# Patient Record
Sex: Male | Born: 1945 | Race: White | Hispanic: No | Smoking: Never smoker
Health system: Southern US, Community
[De-identification: ages and names within clinical notes are randomized; demographics above are authoritative.]

## PROBLEM LIST (undated history)

## (undated) DIAGNOSIS — H25013 Cortical age-related cataract, bilateral: Secondary | ICD-10-CM

## (undated) DIAGNOSIS — E119 Type 2 diabetes mellitus without complications: Secondary | ICD-10-CM

## (undated) DIAGNOSIS — J309 Allergic rhinitis, unspecified: Secondary | ICD-10-CM

## (undated) DIAGNOSIS — I10 Essential (primary) hypertension: Secondary | ICD-10-CM

## (undated) DIAGNOSIS — E785 Hyperlipidemia, unspecified: Secondary | ICD-10-CM

## (undated) DIAGNOSIS — I2510 Atherosclerotic heart disease of native coronary artery without angina pectoris: Secondary | ICD-10-CM

## (undated) DIAGNOSIS — J45909 Unspecified asthma, uncomplicated: Secondary | ICD-10-CM

## (undated) DIAGNOSIS — H3530 Unspecified macular degeneration: Secondary | ICD-10-CM

## (undated) DIAGNOSIS — I4891 Unspecified atrial fibrillation: Secondary | ICD-10-CM

## (undated) HISTORY — DX: Allergic rhinitis, unspecified: J30.9

## (undated) HISTORY — PX: TONSILLECTOMY: SUR1361

## (undated) HISTORY — DX: Type 2 diabetes mellitus without complications: E11.9

## (undated) HISTORY — DX: Hyperlipidemia, unspecified: E78.5

## (undated) HISTORY — DX: Essential (primary) hypertension: I10

## (undated) HISTORY — DX: Atherosclerotic heart disease of native coronary artery without angina pectoris: I25.10

## (undated) HISTORY — DX: Unspecified atrial fibrillation: I48.91

---

## 1999-06-22 ENCOUNTER — Ambulatory Visit (HOSPITAL_COMMUNITY): Admission: RE | Admit: 1999-06-22 | Discharge: 1999-06-22 | Payer: Self-pay | Admitting: *Deleted

## 1999-06-22 ENCOUNTER — Encounter (INDEPENDENT_AMBULATORY_CARE_PROVIDER_SITE_OTHER): Payer: Self-pay | Admitting: Specialist

## 1999-07-17 ENCOUNTER — Ambulatory Visit (HOSPITAL_COMMUNITY): Admission: RE | Admit: 1999-07-17 | Discharge: 1999-07-17 | Payer: Self-pay | Admitting: *Deleted

## 2000-11-27 ENCOUNTER — Emergency Department (HOSPITAL_COMMUNITY): Admission: EM | Admit: 2000-11-27 | Discharge: 2000-11-27 | Payer: Self-pay | Admitting: Emergency Medicine

## 2000-11-27 ENCOUNTER — Encounter: Payer: Self-pay | Admitting: Emergency Medicine

## 2002-09-10 ENCOUNTER — Encounter (INDEPENDENT_AMBULATORY_CARE_PROVIDER_SITE_OTHER): Payer: Self-pay | Admitting: Specialist

## 2002-09-10 ENCOUNTER — Ambulatory Visit (HOSPITAL_COMMUNITY): Admission: RE | Admit: 2002-09-10 | Discharge: 2002-09-10 | Payer: Self-pay | Admitting: *Deleted

## 2008-02-01 ENCOUNTER — Ambulatory Visit: Admission: RE | Admit: 2008-02-01 | Discharge: 2008-02-01 | Payer: Self-pay | Admitting: Internal Medicine

## 2008-02-01 ENCOUNTER — Encounter: Payer: Self-pay | Admitting: Pulmonary Disease

## 2008-02-04 ENCOUNTER — Ambulatory Visit: Payer: Self-pay | Admitting: Pulmonary Disease

## 2008-02-04 DIAGNOSIS — I10 Essential (primary) hypertension: Secondary | ICD-10-CM | POA: Insufficient documentation

## 2008-02-04 DIAGNOSIS — Z794 Long term (current) use of insulin: Secondary | ICD-10-CM | POA: Insufficient documentation

## 2008-02-04 DIAGNOSIS — J309 Allergic rhinitis, unspecified: Secondary | ICD-10-CM | POA: Insufficient documentation

## 2008-02-04 DIAGNOSIS — E119 Type 2 diabetes mellitus without complications: Secondary | ICD-10-CM

## 2008-02-04 DIAGNOSIS — E785 Hyperlipidemia, unspecified: Secondary | ICD-10-CM | POA: Insufficient documentation

## 2008-02-04 DIAGNOSIS — J4540 Moderate persistent asthma, uncomplicated: Secondary | ICD-10-CM | POA: Insufficient documentation

## 2008-02-04 DIAGNOSIS — E11649 Type 2 diabetes mellitus with hypoglycemia without coma: Secondary | ICD-10-CM | POA: Insufficient documentation

## 2008-02-25 ENCOUNTER — Ambulatory Visit: Payer: Self-pay | Admitting: Pulmonary Disease

## 2008-08-30 ENCOUNTER — Ambulatory Visit: Payer: Self-pay | Admitting: Pulmonary Disease

## 2008-08-30 DIAGNOSIS — R059 Cough, unspecified: Secondary | ICD-10-CM | POA: Insufficient documentation

## 2008-08-30 DIAGNOSIS — R05 Cough: Secondary | ICD-10-CM

## 2009-09-06 ENCOUNTER — Ambulatory Visit: Payer: Self-pay | Admitting: Pulmonary Disease

## 2009-09-06 DIAGNOSIS — J209 Acute bronchitis, unspecified: Secondary | ICD-10-CM | POA: Insufficient documentation

## 2010-09-05 ENCOUNTER — Ambulatory Visit: Admit: 2010-09-05 | Payer: Self-pay | Admitting: Pulmonary Disease

## 2010-09-18 NOTE — Assessment & Plan Note (Signed)
Summary: consult for asthma   Referred by:  Merri Brunette PCP:  Merri Brunette  Chief Complaint:  Pulmonary Consult.  History of Present Illness:  the patient is a very pleasant 65 year old male who is being referred today for asthma.  The patient complains of increasing chest congestion over the last few months, and felt it was due to his usual allergic rhinitis that occurs in the spring and fall.  The patient then began to have chest rattling as well as worsening dyspnea on exertion.  His symptoms have worsened at night, and has now gotten to the point that it is difficult for him to sleep.  He denies chronic cough, but is having a lot of congestion with cough productive of thick white mucus primarily in the morning.  He denies any history of recurrent sinusitis, and has no history of asthma as a child.  He denies significant reflux symptoms.  The patient has had pulmonary function studies this month, and these showed severe airflow obstruction with a very significant improvement with bronchodilators.  There was minimal restriction and a normal diffusion capacity.     Current Allergies: No known allergies   Past Medical History:    Current Problems:     DM (ICD-250.00)    HYPERTENSION (ICD-401.9)    HYPERLIPIDEMIA (ICD-272.4)    ALLERGIC RHINITIS (ICD-477.9)        Family History:    heart disease: paternal grandfather (m.i.)    cancer: father (colon), sister (leukemia)  Social History:    Patient never smoked.     pt is divorced.    pt has children.    pt works as a Chief Technology Officer.   Risk Factors:  Tobacco use:  never   Review of Systems      See HPI   Vital Signs:  Patient Profile:   65 Years Old Male Height:     75 inches Weight:      319.13 pounds O2 Sat:      94 % O2 treatment:    Room Air Temp:     98.1 degrees F oral Pulse rate:   108 / minute BP sitting:   118 / 70  (left arm) Cuff size:   large  Vitals Entered By: Cyndia Diver LPN  (February 04, 2008 11:12 AM)             Is Patient Diabetic? Yes  Comments Medications reviewed with patient Cyndia Diver LPN  February 04, 2008 11:13 AM      Physical Exam  General:     obese male in nad Eyes:     PERRLA and EOMI.   Nose:     patent without discharge  Mouth:     clear Neck:     no jvd, tmg,ln Lungs:     diffuse true wheezing, mild pseudowheezing Heart:     rrr, no mrg Abdomen:     soft, nt, nd, bs+ Extremities:     trace edema, pulses intact Neurologic:     alert, oriented, moves all 4     Impression & Recommendations:  Problem # 1:  INTRINSIC ASTHMA, UNSPECIFIED (ICD-493.10) the patient's history and pulmonary function studies are most consistent with adult onset asthma.  The patient is having active bronchospasm at this time, will need aggressive treatment of airway inflammation.  His symptoms are significantly impacting his sleep, and therefore I will treat him with a very short course of prednisone  to help with symptom relief.  I've  asked the patient to watch his blood sugars closely during this time.  I will also start the patient on a combined long-acting bronchodilator, as well as inhaled corticosteroid.  I would hope that we can get him to the inhaled steroid alone with p.r.n. beta-2 agonist.  I have had a long discussion with the patient about asthma, including the role of airway inflammation.  I have explained to the patient he will need chronic medication lifetime, and that he should not take his medications as needed.  Medications Added to Medication List This Visit: 1)  Glyburide-metformin 5-500 Mg Tabs (Glyburide-metformin) .... Take 3 tabs by mouth daily 2)  Amlodipine Besylate 10 Mg Tabs (Amlodipine besylate) .... Take 1/2 tab by mouth once daily 3)  Quinapril Hcl 40 Mg Tabs (Quinapril hcl) .... Take 1 tablet by mouth once a day 4)  Diovan Hct 320-25 Mg Tabs (Valsartan-hydrochlorothiazide) .... Take 1 tablet by mouth once a day 5)  Lipitor  40 Mg Tabs (Atorvastatin calcium) .... Take 1 tablet by mouth once a day 6)  Aciphex 20 Mg Tbec (Rabeprazole sodium) .... Take 1 tablet by mouth once a day 7)  Aspirin Low Dose 81 Mg Tabs (Aspirin) .... Take 1 tablet by mouth once a day 8)  Glucosamine Chondroitin  .... Take 3 tabs by mouth daily 9)  Zyrtec Allergy 10 Mg Tabs (Cetirizine hcl) .... Take 1 tablet by mouth once a day as needed 10)  Ibuprofen 200 Mg Caps (Ibuprofen) .... Take by mouth as needed 11)  Proventil Hfa 108 (90 Base) Mcg/act Aers (Albuterol sulfate) .... Inhale 2 puffs every 4 to 6 hours as needed 12)  Prednisone 20 Mg Tabs (Prednisone) .... Take 2 tabs by mouth daily x 2 days, then 1 1/2 tabs by mouth daily for 2 days, then 1 tab by mouth daily for 2 days, then 1/2 tab by mouth daily for 2 days then d/c   Patient Instructions: 1)  advair 250/50 one in am and pm...rinse mouth WELL 2)  use proventil 2 puffs every 4 hrs if needed for rescue 3)  will give prednisone taper to get you over this acute episode. Watch blood sugars during this time. 4)  f/u in 3 weeks or sooner if problems.   Prescriptions: PREDNISONE 20 MG  TABS (PREDNISONE) take 2 tabs by mouth daily x 2 days, then 1 1/2 tabs by mouth daily for 2 days, then 1 tab by mouth daily for 2 days, then 1/2 tab by mouth daily for 2 days then d/c  #qs x 0   Entered by:   Cyndia Diver LPN   Authorized by:   Barbaraann Share MD   Signed by:   Cyndia Diver LPN on 06/15/2535   Method used:   Telephoned to ...       [facility] #[id number]       [address]       [place], [place]  [address]       Ph: [phone]       Fax: [phone]   RxID:   [id number]  ]

## 2010-09-18 NOTE — Assessment & Plan Note (Signed)
Summary: rov for asthma, acute bronchitis   Primary Provider/Referring Provider:  Merri Brunette  CC:  Pt is here for a 1 yr f/u appt.  Pt states breathing has been "fine."  Pt states he hasn't needed to use rescue hfa.  Pt does c/o "cold symptoms."  Pt c/o coughing up yellow to green colored sputum with small "spots of dark red blood."  Pt also c/o headache with "pain behind R eye."  .  History of Present Illness: the pt comes in today for f/u of his known asthma.  He has been doing well on his current inhaler regimen, and has had no acute exacerbations or increase in rescue inhaler use.  He states that his exertional tolerance is at his usual baseline.  He does c/o of a 2 week h/o head congestion, postnasal drip, and now chest congestion with cough of discolored mucus.  It is purulent with a few flecks of blood.  He denies nasal and head congestion now, but feels it has moved into his chest.  It has not affected his breathing.  Current Medications (verified): 1)  Amlodipine Besylate 10 Mg  Tabs (Amlodipine Besylate) .... Take 1/2 Tab By Mouth Once Daily 2)  Diovan Hct 320-25 Mg  Tabs (Valsartan-Hydrochlorothiazide) .... Take 1 Tablet By Mouth Once A Day 3)  Lipitor 40 Mg  Tabs (Atorvastatin Calcium) .... Take 1 Tablet By Mouth Once A Day 4)  Aspirin Low Dose 81 Mg  Tabs (Aspirin) .... Take 1 Tablet By Mouth Once A Day 5)  Glucosamine Chondroitin .... Take 3 Tabs By Mouth Daily 6)  Zyrtec Allergy 10 Mg  Tabs (Cetirizine Hcl) .... Take 1 Tablet By Mouth Once A Day As Needed 7)  Ibuprofen 200 Mg  Caps (Ibuprofen) .... Take By Mouth As Needed 8)  Proventil Hfa 108 (90 Base) Mcg/act  Aers (Albuterol Sulfate) .... Inhale 2 Puffs Every 4 To 6 Hours As Needed 9)  Advair Diskus 250-50 Mcg/dose  Misc (Fluticasone-Salmeterol) .... Inhale 1 Puff Two Times A Day 10)  Prilosec 40 Mg Cpdr (Omeprazole) .... Take 1 Tablet By Mouth Once A Day 11)  Actos 30 Mg Tabs (Pioglitazone Hcl) .... Take 1 Tablet By Mouth  Once A Day 12)  Humalog Kwikpen 100 Unit/ml Soln (Insulin Lispro (Human)) .... 75 Units Two Times A Day 13)  Glucosamine-Chondroitin  Caps (Glucosamine-Chondroit-Vit C-Mn) .... Take 3 Tabs By Mouth Daily  Allergies (verified): No Known Drug Allergies  Review of Systems       The patient complains of productive cough, coughing up blood, headaches, sneezing, and change in color of mucus.  The patient denies shortness of breath with activity, shortness of breath at rest, non-productive cough, chest pain, irregular heartbeats, acid heartburn, indigestion, loss of appetite, weight change, abdominal pain, difficulty swallowing, sore throat, tooth/dental problems, nasal congestion/difficulty breathing through nose, itching, ear ache, anxiety, depression, hand/feet swelling, joint stiffness or pain, rash, and fever.    Vital Signs:  Patient profile:   65 year old male Height:      75 inches Weight:      337.13 pounds BMI:     42.29 O2 Sat:      94 % on Room air Temp:     98.5 degrees F oral Pulse rate:   98 / minute BP sitting:   136 / 78  (left arm) Cuff size:   large  Vitals Entered By: Arman Filter LPN (September 06, 2009 3:25 PM)  O2 Flow:  Room air CC: Pt  is here for a 1 yr f/u appt.  Pt states breathing has been "fine."  Pt states he hasn't needed to use rescue hfa.  Pt does c/o "cold symptoms."  Pt c/o coughing up yellow to green colored sputum with small "spots of dark red blood."  Pt also c/o headache with "pain behind R eye."   Comments Medications reviewed with patient Arman Filter LPN  September 06, 2009 3:25 PM    Physical Exam  General:  ow male in nad Nose:  no purulence noted Lungs:  totally clear to auscultation no wheezing or rhonchi Heart:  rrr, 2/6 sem Neurologic:  alert and oriented, moves all 4.   Impression & Recommendations:  Problem # 1:  INTRINSIC ASTHMA, UNSPECIFIED (ICD-493.10) the pt's asthma has been doing very well on his current regimen.  I have  asked him to continue, and also to work on weight loss and conditioning.  Problem # 2:  BRONCHITIS, ACUTE (ICD-466.0) The pt has had persistent symptoms for at least 2 weeks, outside the usual window for viral infections.  Will treat with abx for possible bacterial acute bronchitis.  Medications Added to Medication List This Visit: 1)  Prilosec 40 Mg Cpdr (Omeprazole) .... Take 1 tablet by mouth once a day 2)  Actos 30 Mg Tabs (Pioglitazone hcl) .... Take 1 tablet by mouth once a day 3)  Humalog Kwikpen 100 Unit/ml Soln (Insulin lispro (human)) .... 75 units two times a day 4)  Glucosamine-chondroitin Caps (Glucosamine-chondroit-vit c-mn) .... Take 3 tabs by mouth daily 5)  Zithromax Z-pak 250 Mg Tabs (Azithromycin) .... 2 by mouth today, then one each day until gone.  Other Orders: Est. Patient Level III (25956)  Patient Instructions: 1)  will treat with zpak to see if symptoms improve. 2)  no change in asthma meds. 3)  followup with me in 12mos or sooner if problems.  Prescriptions: ZITHROMAX Z-PAK 250 MG TABS (AZITHROMYCIN) 2 by mouth today, then one each day until gone.  #6 x 0   Entered and Authorized by:   Barbaraann Share MD   Signed by:   Barbaraann Share MD on 09/06/2009   Method used:   Print then Give to Patient   RxID:   [id number]    Immunization History:  Influenza Immunization History:    Influenza:  historical (08/19/2008)

## 2010-09-18 NOTE — Assessment & Plan Note (Signed)
Summary: rov for asthma   Referred by:  Merri Brunette PCP:  Merri Brunette  Chief Complaint:  Pt is here for a 3 week follow up.  Pt states breathing is "much better" since starting Advair.  Marland Kitchen  History of Present Illness: the patient comes in today for followup of his asthma. He has been on Advair since last visit, and has seen a significant improvement in his breathing. He is no longer wheezing or coughing, and his exercise tolerance has increased significantly. He has not used his rescue inhaler since the last visit. Overall, he is pleased with his breathing, and has had no side effects from the medications.     Current Allergies: No known allergies       Vital Signs:  Patient Profile:   65 Years Old Male Height:     75 inches Weight:      324.50 pounds O2 Sat:      91 % O2 treatment:    Room Air Temp:     98.3 degrees F oral Pulse rate:   102 / minute BP sitting:   130 / 68  (left arm) Cuff size:   regular  Vitals Entered By: Cyndia Diver LPN (February 24, 1609 9:24 AM)             Comments Medications reviewed with patient Cyndia Diver LPN  February 25, 9603 9:24 AM      Physical Exam  General:     obese male in nad Lungs:     totally clear to auscultation. Heart:     rrr, no mrg.      Impression & Recommendations:  Problem # 1:  INTRINSIC ASTHMA, UNSPECIFIED (ICD-493.10) The pt is doing very well on advair.  No further symptoms of sob or wheezing. I have gone over with him again the inflammatory cascade of asthma, and how important it is to stay on medicine everyday.  I have also reviewed the "red flags" for poorly controlled asthma.  Will check spirometry next visit  Medications Added to Medication List This Visit: 1)  Advair Diskus 250-50 Mcg/dose Misc (Fluticasone-salmeterol) .... Inhale 1 puff two times a day   Patient Instructions: 1)  Please schedule a follow-up appointment in 6 months. 2)  no change in medications 3)  Will check spirometry at the  next visit   Prescriptions: ADVAIR DISKUS 250-50 MCG/DOSE  MISC (FLUTICASONE-SALMETEROL) Inhale 1 puff two times a day  #1 x 6   Entered and Authorized by:   Barbaraann Share MD   Signed by:   Barbaraann Share MD on 02/25/2008   Method used:   Electronically sent to ...       [facility] #[id number]*       [address]       [place], [place]  [address]       Ph: [phone]       Fax: [phone]   RxID:   [id number]  ]

## 2010-09-18 NOTE — Assessment & Plan Note (Signed)
Summary: rov for asthma   Referred by:  Merri Brunette PCP:  Merri Brunette  Chief Complaint:  Asthma.  Pt states his breathing has improved on Advair. Still has a little congestion.Marland Kitchen  History of Present Illness: the pt comes in today for f/u of his asthma.  He has been on advair since the last visit, and has seen a considerable improvement in his breathing since being on the medication.  He still c/o some dyspnea with heavy exertional activities, but this is most likely due to his morbid obesity and deconditioning.  His only other complaint is that of upper airway "congestion" that leads to excessive throat clearing.  He does have a history of reflux, but does not feel he has significant symptoms.  He is having a lot of postnasal drip.  It should also be noted that he does take quinapril as well, and advair has been noted to have an associated upper airway irritation with some frequency.     Prior Medications Reviewed Using: Patient Recall  Updated Prior Medication List: GLYBURIDE-METFORMIN 5-500 MG  TABS (GLYBURIDE-METFORMIN) take 3 tabs by mouth daily AMLODIPINE BESYLATE 10 MG  TABS (AMLODIPINE BESYLATE) take 1/2 tab by mouth once daily QUINAPRIL HCL 40 MG  TABS (QUINAPRIL HCL) Take 1 tablet by mouth once a day DIOVAN HCT 320-25 MG  TABS (VALSARTAN-HYDROCHLOROTHIAZIDE) Take 1 tablet by mouth once a day LIPITOR 40 MG  TABS (ATORVASTATIN CALCIUM) Take 1 tablet by mouth once a day ASPIRIN LOW DOSE 81 MG  TABS (ASPIRIN) Take 1 tablet by mouth once a day * GLUCOSAMINE CHONDROITIN take 3 tabs by mouth daily ZYRTEC ALLERGY 10 MG  TABS (CETIRIZINE HCL) Take 1 tablet by mouth once a day as needed IBUPROFEN 200 MG  CAPS (IBUPROFEN) take by mouth as needed PROVENTIL HFA 108 (90 BASE) MCG/ACT  AERS (ALBUTEROL SULFATE) inhale 2 puffs every 4 to 6 hours as needed ADVAIR DISKUS 250-50 MCG/DOSE  MISC (FLUTICASONE-SALMETEROL) Inhale 1 puff two times a day  Current Allergies (reviewed today): No known  allergies      Review of Systems      See HPI   Vital Signs:  Patient Profile:   65 Years Old Male Height:     75 inches Weight:      319.6 pounds O2 Sat:      95 % O2 treatment:    Room Air Temp:     97.8 degrees F oral Pulse rate:   94 / minute BP sitting:   114 / 78  (left arm) Cuff size:   large  Vitals Entered By: Michel Bickers CMA (August 30, 2008 9:27 AM)                 Physical Exam  General:     obese male in nad Lungs:     totally clear to auscultation Heart:     rrr, no mrg    Pulmonary Function Test Date: 08/30/2008 Height (in.): 75 Gender: Male  Pre-Spirometry FVC    Value: 4.49 L/min   Pred: 5.52 L/min     % Pred: 81 % FEV1    Value: 3.08 L     Pred: 4.41 L     % Pred: 69 % FEV1/FVC  Value: 69 %     Comments: minimal airflow obstruction    Problem # 1:  INTRINSIC ASTHMA, UNSPECIFIED (ICD-493.10) the pt is doing well from an asthma standpoint.  He has seen significant improvement in his breathing on advair.  His  FEV1 is dramatically improved since last visit.  No change in meds currently.  Problem # 2:  COUGH (ICD-786.2) his history is most c/w an upper airway issue.  He has multiple reasons for having this, including postnasal drip, LPR, ACE inhibitor, and the dry powder from advair.  At this point, will give him a trial of otc antihistamine and see how he does.  We can always change advair to symbicort, try a ppi, or get off ace if symptoms continue.  The pt will let us know if this doesn't help   Patient Instructions: 1)  stay on advair 2)  trial of chlorpheniramine 8mg  at bedtime for postnasal drip 3)  f/u with me in one year or sooner if problems

## 2010-10-19 ENCOUNTER — Ambulatory Visit: Payer: Self-pay | Admitting: Pulmonary Disease

## 2010-11-24 ENCOUNTER — Other Ambulatory Visit: Payer: Self-pay | Admitting: Pulmonary Disease

## 2010-11-27 ENCOUNTER — Other Ambulatory Visit: Payer: Self-pay | Admitting: Pulmonary Disease

## 2010-11-27 MED ORDER — FLUTICASONE-SALMETEROL 250-50 MCG/DOSE IN AEPB: 1.0000 | INHALATION_SPRAY | Freq: Two times a day (BID) | RESPIRATORY_TRACT | Status: DC

## 2010-11-30 ENCOUNTER — Ambulatory Visit: Payer: Self-pay | Admitting: Pulmonary Disease

## 2010-12-19 ENCOUNTER — Encounter: Payer: Self-pay | Admitting: Pulmonary Disease

## 2010-12-21 ENCOUNTER — Ambulatory Visit: Payer: Self-pay | Admitting: Pulmonary Disease

## 2011-01-01 ENCOUNTER — Encounter: Payer: Self-pay | Admitting: Pulmonary Disease

## 2011-01-04 ENCOUNTER — Ambulatory Visit (INDEPENDENT_AMBULATORY_CARE_PROVIDER_SITE_OTHER): Payer: Federal, State, Local not specified - PPO | Admitting: Pulmonary Disease

## 2011-01-04 ENCOUNTER — Encounter: Payer: Self-pay | Admitting: Pulmonary Disease

## 2011-01-04 VITALS — BP 108/62 | HR 82 | Temp 98.2°F | Ht 75.0 in | Wt 354.8 lb

## 2011-01-04 DIAGNOSIS — J45909 Unspecified asthma, uncomplicated: Secondary | ICD-10-CM

## 2011-01-04 NOTE — Assessment & Plan Note (Signed)
The pt is doing well on his current regimen, and rarely uses his rescue inhaler.  He feels his breathing is doing well, and has not had an acute exac since his last visit.  I have asked him to continue on current regimen, and to f/u with me in one year.

## 2011-01-04 NOTE — Progress Notes (Signed)
  Subjective:    Patient ID: Brandon Sanchez, male    DOB: 14-Feb-1946, 65 y.o.   MRN: [id number]  HPI The pt comes in today for f/u of his known asthma.  He is sticking to his current regimen, and reports no flareup or rescue inhaler use.  He feels his breathing is doing well, and denies cough or congestion.  His allergies have really not been an issue for him this year.    Review of Systems  Constitutional: Negative for fever and unexpected weight change.  HENT: Negative for ear pain, nosebleeds, congestion, sore throat, rhinorrhea, sneezing, trouble swallowing, dental problem, postnasal drip and sinus pressure.   Eyes: Negative for redness and itching.  Respiratory: Negative for cough, chest tightness, shortness of breath and wheezing.   Cardiovascular: Negative for palpitations and leg swelling.  Gastrointestinal: Negative for nausea and vomiting.  Genitourinary: Negative for dysuria.  Musculoskeletal: Negative for joint swelling.  Skin: Negative for rash.  Neurological: Negative for headaches.  Hematological: Does not bruise/bleed easily.  Psychiatric/Behavioral: Negative for dysphoric mood. The patient is not nervous/anxious.        Objective:   Physical Exam Obese male in nad Nares without discharge or obvious purulence Chest clear to auscultation Cor with rrr LE with 2+ edema, no cyanosis noted Alert, oriented, moves all 4        Assessment & Plan:

## 2011-01-04 NOTE — Patient Instructions (Signed)
Stay on current asthma meds.  followup with me in one year.

## 2011-01-04 NOTE — Op Note (Signed)
   NAME:  Brandon Sanchez, Brandon Sanchez NO.:  [id number]   MEDICAL RECORD NO.:  [id number]                   PATIENT TYPE:  AMB   LOCATION:  ENDO                                 FACILITY:  [facility]   PHYSICIAN:  Georgiana Spinner, M.D.                 DATE OF BIRTH:  11/29/1945   DATE OF PROCEDURE:  09/10/2002  DATE OF DISCHARGE:                                 OPERATIVE REPORT   PROCEDURE:  Colonoscopy.   INDICATIONS:  Colon polyps.   ANESTHESIA:  None given at patient's request.  He had no ride home.   DESCRIPTION OF PROCEDURE:  With patient in the left lateral decubitus  position, rectal examination was performed.  Subsequently, the Olympus  videoscopic colonoscope was inserted in the rectum and passed under direct  vision to the cecum, identified by the ileocecal valve and appendiceal  orifice, both of which were photographed.  The cecum appeared to be somewhat  edematous and possibly inflamed with a localized colitis.  Therefore,  biopsies were taken.  We entered into the terminal ileum which appeared  maybe mildly edematous.  It, too, was photographed, and it too, was  biopsied.  From this point, the colonoscope was then slowly withdrawn,  taking circumferential views of the entire colonic mucosa, stopping in the  rectum which appears normal on direct and retroflexed view.  The endoscope  was straightened and withdrawn.  The patient's vital signs and pulse  oximeter remained stable.  The patient tolerated the procedure well without  apparent complications.   FINDINGS:  Changes of possible colitis in cecum, localized with possibly  some edema of terminal ileum as well.  At any rate, we will await biopsy  report.  The patient will call me for results and follow up with me as an  outpatient.                                               Georgiana Spinner, M.D.    GMO/MEDQ  D:  09/10/2002  T:  09/10/2002  Job:  [id number]

## 2011-01-19 ENCOUNTER — Other Ambulatory Visit: Payer: Self-pay | Admitting: Pulmonary Disease

## 2011-11-07 ENCOUNTER — Other Ambulatory Visit: Payer: Self-pay | Admitting: Nephrology

## 2011-11-07 DIAGNOSIS — N183 Chronic kidney disease, stage 3 unspecified: Secondary | ICD-10-CM

## 2011-12-13 ENCOUNTER — Ambulatory Visit
Admission: RE | Admit: 2011-12-13 | Discharge: 2011-12-13 | Disposition: A | Discharge status: Home / Self Care | Payer: Federal, State, Local not specified - PPO | Source: Ambulatory Visit | Attending: Nephrology | Admitting: Nephrology

## 2011-12-13 DIAGNOSIS — N183 Chronic kidney disease, stage 3 unspecified: Secondary | ICD-10-CM

## 2011-12-25 LAB — PULMONARY FUNCTION TEST

## 2012-01-10 ENCOUNTER — Ambulatory Visit: Payer: Federal, State, Local not specified - PPO | Admitting: Pulmonary Disease

## 2012-01-30 ENCOUNTER — Other Ambulatory Visit: Payer: Self-pay | Admitting: Pulmonary Disease

## 2012-02-07 ENCOUNTER — Encounter: Payer: Self-pay | Admitting: Pulmonary Disease

## 2012-02-07 ENCOUNTER — Ambulatory Visit (INDEPENDENT_AMBULATORY_CARE_PROVIDER_SITE_OTHER): Payer: Federal, State, Local not specified - PPO | Admitting: Pulmonary Disease

## 2012-02-07 VITALS — BP 106/62 | HR 81 | Temp 98.2°F | Ht 75.0 in | Wt 358.6 lb

## 2012-02-07 DIAGNOSIS — J45909 Unspecified asthma, uncomplicated: Secondary | ICD-10-CM

## 2012-02-07 MED ORDER — ALBUTEROL SULFATE HFA 108 (90 BASE) MCG/ACT IN AERS: 2.0000 | INHALATION_SPRAY | Freq: Four times a day (QID) | RESPIRATORY_TRACT | Status: DC | PRN

## 2012-02-07 NOTE — Assessment & Plan Note (Signed)
The patient has been doing well from an asthma standpoint, and in fact his recent spirometry shows that his flows have significantly improved.  He is having no issues with acute exacerbations or rescue inhaler use, and I have asked him to focus on weight loss and a conditioning program.  His only complaint today is that of hoarseness and throat clearing associated with Advair at times, and I have offered to change him to a different medication.  He wishes to stay on Advair since he is doing so well, and will call me if his symptoms worsen.

## 2012-02-07 NOTE — Patient Instructions (Addendum)
Stay on advair.  However, call me if you feel this is continuing to aggrevate your throat.   Work on weight loss and conditioning.  followup with me in one year.

## 2012-02-07 NOTE — Addendum Note (Signed)
Addended by: Nita Sells on: 02/07/2012 02:09 PM   Modules accepted: Orders

## 2012-02-07 NOTE — Progress Notes (Signed)
  Subjective:    Patient ID: Brandon Sanchez, male    DOB: 29-Jul-1946, 66 y.o.   MRN: [id number]  HPI Patient comes in today for followup of his known asthma.  He's been compliant on his maintenance inhaler, and has not required his rescue inhaler.  He denies having an acute exacerbation since his last visit.  He has had a recent exercise test with spirometry, and his airflows are actually improved significantly from 2009.   Review of Systems  Constitutional: Negative for fever and unexpected weight change.  HENT: Negative for ear pain, nosebleeds, congestion, sore throat, rhinorrhea, sneezing, trouble swallowing, dental problem, postnasal drip and sinus pressure.        Throat clearing  Eyes: Negative for redness and itching.  Respiratory: Negative for cough, chest tightness, shortness of breath and wheezing.   Cardiovascular: Positive for leg swelling. Negative for palpitations.  Gastrointestinal: Negative for nausea and vomiting.  Genitourinary: Negative for dysuria.  Musculoskeletal: Negative for joint swelling.  Skin: Negative for rash.  Neurological: Negative for headaches.  Hematological: Does not bruise/bleed easily.  Psychiatric/Behavioral: Negative for dysphoric mood. The patient is not nervous/anxious.   All other systems reviewed and are negative.       Objective:   Physical Exam Morbidly obese male in no acute distress Nose with purulent discharge noted Chest totally clear to auscultation, no wheezing or rhonchi Cardiac exam with regular rate and rhythm, 2/6 murmur Lower extremities with 2+ edema, no cyanosis Alert and oriented times all 4 extremities.       Assessment & Plan:

## 2012-04-16 DIAGNOSIS — I10 Essential (primary) hypertension: Secondary | ICD-10-CM | POA: Diagnosis not present

## 2012-04-16 DIAGNOSIS — N182 Chronic kidney disease, stage 2 (mild): Secondary | ICD-10-CM | POA: Diagnosis not present

## 2012-06-09 DIAGNOSIS — I10 Essential (primary) hypertension: Secondary | ICD-10-CM | POA: Diagnosis not present

## 2012-06-09 DIAGNOSIS — IMO0001 Reserved for inherently not codable concepts without codable children: Secondary | ICD-10-CM | POA: Diagnosis not present

## 2012-06-09 DIAGNOSIS — E7800 Pure hypercholesterolemia, unspecified: Secondary | ICD-10-CM | POA: Diagnosis not present

## 2012-06-12 DIAGNOSIS — M76899 Other specified enthesopathies of unspecified lower limb, excluding foot: Secondary | ICD-10-CM | POA: Diagnosis not present

## 2012-06-12 DIAGNOSIS — Z23 Encounter for immunization: Secondary | ICD-10-CM | POA: Diagnosis not present

## 2012-06-12 DIAGNOSIS — I10 Essential (primary) hypertension: Secondary | ICD-10-CM | POA: Diagnosis not present

## 2012-06-12 DIAGNOSIS — J45909 Unspecified asthma, uncomplicated: Secondary | ICD-10-CM | POA: Diagnosis not present

## 2012-06-12 DIAGNOSIS — E119 Type 2 diabetes mellitus without complications: Secondary | ICD-10-CM | POA: Diagnosis not present

## 2012-06-15 DIAGNOSIS — N182 Chronic kidney disease, stage 2 (mild): Secondary | ICD-10-CM | POA: Diagnosis not present

## 2012-06-15 DIAGNOSIS — IMO0001 Reserved for inherently not codable concepts without codable children: Secondary | ICD-10-CM | POA: Diagnosis not present

## 2012-06-15 DIAGNOSIS — E7800 Pure hypercholesterolemia, unspecified: Secondary | ICD-10-CM | POA: Diagnosis not present

## 2012-06-15 DIAGNOSIS — I10 Essential (primary) hypertension: Secondary | ICD-10-CM | POA: Diagnosis not present

## 2012-10-13 DIAGNOSIS — I10 Essential (primary) hypertension: Secondary | ICD-10-CM | POA: Diagnosis not present

## 2012-10-13 DIAGNOSIS — IMO0001 Reserved for inherently not codable concepts without codable children: Secondary | ICD-10-CM | POA: Diagnosis not present

## 2012-10-13 DIAGNOSIS — E119 Type 2 diabetes mellitus without complications: Secondary | ICD-10-CM | POA: Diagnosis not present

## 2012-10-21 ENCOUNTER — Other Ambulatory Visit: Payer: Self-pay | Admitting: Pulmonary Disease

## 2012-10-29 DIAGNOSIS — M25569 Pain in unspecified knee: Secondary | ICD-10-CM | POA: Diagnosis not present

## 2012-11-03 DIAGNOSIS — Z125 Encounter for screening for malignant neoplasm of prostate: Secondary | ICD-10-CM | POA: Diagnosis not present

## 2012-11-03 DIAGNOSIS — E7800 Pure hypercholesterolemia, unspecified: Secondary | ICD-10-CM | POA: Diagnosis not present

## 2012-11-03 DIAGNOSIS — Z0000 Encounter for general adult medical examination without abnormal findings: Secondary | ICD-10-CM | POA: Diagnosis not present

## 2012-11-03 DIAGNOSIS — I10 Essential (primary) hypertension: Secondary | ICD-10-CM | POA: Diagnosis not present

## 2012-11-03 DIAGNOSIS — IMO0001 Reserved for inherently not codable concepts without codable children: Secondary | ICD-10-CM | POA: Diagnosis not present

## 2012-11-09 DIAGNOSIS — E7800 Pure hypercholesterolemia, unspecified: Secondary | ICD-10-CM | POA: Diagnosis not present

## 2012-11-09 DIAGNOSIS — I10 Essential (primary) hypertension: Secondary | ICD-10-CM | POA: Diagnosis not present

## 2012-11-09 DIAGNOSIS — L570 Actinic keratosis: Secondary | ICD-10-CM | POA: Diagnosis not present

## 2012-11-09 DIAGNOSIS — N529 Male erectile dysfunction, unspecified: Secondary | ICD-10-CM | POA: Diagnosis not present

## 2012-11-09 DIAGNOSIS — R609 Edema, unspecified: Secondary | ICD-10-CM | POA: Diagnosis not present

## 2012-11-09 DIAGNOSIS — IMO0001 Reserved for inherently not codable concepts without codable children: Secondary | ICD-10-CM | POA: Diagnosis not present

## 2012-11-16 DIAGNOSIS — R609 Edema, unspecified: Secondary | ICD-10-CM | POA: Diagnosis not present

## 2012-11-16 DIAGNOSIS — E291 Testicular hypofunction: Secondary | ICD-10-CM | POA: Diagnosis not present

## 2012-11-19 DIAGNOSIS — E291 Testicular hypofunction: Secondary | ICD-10-CM | POA: Diagnosis not present

## 2012-11-19 DIAGNOSIS — I10 Essential (primary) hypertension: Secondary | ICD-10-CM | POA: Diagnosis not present

## 2012-11-19 DIAGNOSIS — N182 Chronic kidney disease, stage 2 (mild): Secondary | ICD-10-CM | POA: Diagnosis not present

## 2012-11-19 DIAGNOSIS — R609 Edema, unspecified: Secondary | ICD-10-CM | POA: Diagnosis not present

## 2012-11-30 DIAGNOSIS — Z79899 Other long term (current) drug therapy: Secondary | ICD-10-CM | POA: Diagnosis not present

## 2012-11-30 DIAGNOSIS — I10 Essential (primary) hypertension: Secondary | ICD-10-CM | POA: Diagnosis not present

## 2012-12-10 DIAGNOSIS — N289 Disorder of kidney and ureter, unspecified: Secondary | ICD-10-CM | POA: Diagnosis not present

## 2012-12-10 DIAGNOSIS — I10 Essential (primary) hypertension: Secondary | ICD-10-CM | POA: Diagnosis not present

## 2012-12-10 DIAGNOSIS — R609 Edema, unspecified: Secondary | ICD-10-CM | POA: Diagnosis not present

## 2012-12-10 DIAGNOSIS — Z79899 Other long term (current) drug therapy: Secondary | ICD-10-CM | POA: Diagnosis not present

## 2012-12-17 DIAGNOSIS — Z79899 Other long term (current) drug therapy: Secondary | ICD-10-CM | POA: Diagnosis not present

## 2012-12-18 DIAGNOSIS — I10 Essential (primary) hypertension: Secondary | ICD-10-CM | POA: Diagnosis not present

## 2012-12-18 DIAGNOSIS — IMO0001 Reserved for inherently not codable concepts without codable children: Secondary | ICD-10-CM | POA: Diagnosis not present

## 2012-12-22 DIAGNOSIS — N183 Chronic kidney disease, stage 3 unspecified: Secondary | ICD-10-CM | POA: Diagnosis not present

## 2012-12-22 DIAGNOSIS — R609 Edema, unspecified: Secondary | ICD-10-CM | POA: Diagnosis not present

## 2012-12-22 DIAGNOSIS — N179 Acute kidney failure, unspecified: Secondary | ICD-10-CM | POA: Diagnosis not present

## 2012-12-23 DIAGNOSIS — Z79899 Other long term (current) drug therapy: Secondary | ICD-10-CM | POA: Diagnosis not present

## 2012-12-24 DIAGNOSIS — R609 Edema, unspecified: Secondary | ICD-10-CM | POA: Diagnosis not present

## 2012-12-24 DIAGNOSIS — E291 Testicular hypofunction: Secondary | ICD-10-CM | POA: Diagnosis not present

## 2012-12-24 DIAGNOSIS — N289 Disorder of kidney and ureter, unspecified: Secondary | ICD-10-CM | POA: Diagnosis not present

## 2012-12-24 DIAGNOSIS — Z79899 Other long term (current) drug therapy: Secondary | ICD-10-CM | POA: Diagnosis not present

## 2012-12-25 DIAGNOSIS — E119 Type 2 diabetes mellitus without complications: Secondary | ICD-10-CM | POA: Diagnosis not present

## 2012-12-31 DIAGNOSIS — E291 Testicular hypofunction: Secondary | ICD-10-CM | POA: Diagnosis not present

## 2012-12-31 DIAGNOSIS — IMO0001 Reserved for inherently not codable concepts without codable children: Secondary | ICD-10-CM | POA: Diagnosis not present

## 2013-01-06 DIAGNOSIS — Z79899 Other long term (current) drug therapy: Secondary | ICD-10-CM | POA: Diagnosis not present

## 2013-01-06 DIAGNOSIS — E119 Type 2 diabetes mellitus without complications: Secondary | ICD-10-CM | POA: Diagnosis not present

## 2013-01-07 DIAGNOSIS — E291 Testicular hypofunction: Secondary | ICD-10-CM | POA: Diagnosis not present

## 2013-01-07 DIAGNOSIS — N289 Disorder of kidney and ureter, unspecified: Secondary | ICD-10-CM | POA: Diagnosis not present

## 2013-01-07 DIAGNOSIS — R609 Edema, unspecified: Secondary | ICD-10-CM | POA: Diagnosis not present

## 2013-01-12 DIAGNOSIS — N289 Disorder of kidney and ureter, unspecified: Secondary | ICD-10-CM | POA: Diagnosis not present

## 2013-01-19 DIAGNOSIS — N289 Disorder of kidney and ureter, unspecified: Secondary | ICD-10-CM | POA: Diagnosis not present

## 2013-01-20 DIAGNOSIS — Z006 Encounter for examination for normal comparison and control in clinical research program: Secondary | ICD-10-CM | POA: Diagnosis not present

## 2013-01-20 DIAGNOSIS — I10 Essential (primary) hypertension: Secondary | ICD-10-CM | POA: Diagnosis not present

## 2013-01-20 DIAGNOSIS — E7800 Pure hypercholesterolemia, unspecified: Secondary | ICD-10-CM | POA: Diagnosis not present

## 2013-01-20 DIAGNOSIS — N182 Chronic kidney disease, stage 2 (mild): Secondary | ICD-10-CM | POA: Diagnosis not present

## 2013-01-20 DIAGNOSIS — IMO0001 Reserved for inherently not codable concepts without codable children: Secondary | ICD-10-CM | POA: Diagnosis not present

## 2013-02-12 ENCOUNTER — Ambulatory Visit (INDEPENDENT_AMBULATORY_CARE_PROVIDER_SITE_OTHER): Payer: Medicare Other | Admitting: Pulmonary Disease

## 2013-02-12 ENCOUNTER — Encounter: Payer: Self-pay | Admitting: Pulmonary Disease

## 2013-02-12 VITALS — BP 118/70 | HR 75 | Temp 98.1°F | Ht 74.0 in | Wt 352.8 lb

## 2013-02-12 DIAGNOSIS — J45909 Unspecified asthma, uncomplicated: Secondary | ICD-10-CM

## 2013-02-12 NOTE — Patient Instructions (Addendum)
Stay on your current asthma meds. Stay as active as possible. followup with me in one year if doing well.

## 2013-02-12 NOTE — Progress Notes (Signed)
  Subjective:    Patient ID: Brandon Sanchez, male    DOB: Aug 05, 1946, 67 y.o.   MRN: [id number]  HPI The patient comes in today for followup of his known asthma.  He is staying compliant on his current regimen, and is doing very well.  He never requires his rescue inhaler, and has not had an acute exacerbation since his last visit.  He is very satisfied with his asthma control, and has been exercising regularly.   Review of Systems  Constitutional: Negative for fever and unexpected weight change.  HENT: Negative for ear pain, nosebleeds, congestion, sore throat, rhinorrhea, sneezing, trouble swallowing, dental problem, postnasal drip and sinus pressure.        Allergies  Eyes: Negative for redness and itching.  Respiratory: Negative for cough, chest tightness, shortness of breath and wheezing.   Cardiovascular: Negative for palpitations and leg swelling.  Gastrointestinal: Negative for nausea and vomiting.  Genitourinary: Negative for dysuria.  Musculoskeletal: Negative for joint swelling.  Skin: Negative for rash.  Neurological: Negative for headaches.  Hematological: Does not bruise/bleed easily.  Psychiatric/Behavioral: Negative for dysphoric mood. The patient is not nervous/anxious.        Objective:   Physical Exam Obese male in no acute distress Nose without purulence or discharge noted Neck without lymphadenopathy or thyromegaly Chest totally clear to auscultation, no wheezing Cardiac exam with regular rate and rhythm, 2 or 6 systolic murmur Lower extremities with 2+ edema, no cyanosis Alert and oriented, moves all 4 extremities.       Assessment & Plan:

## 2013-02-12 NOTE — Assessment & Plan Note (Signed)
The patient is doing very well from an asthma standpoint on his current regimen.  I've asked him to continue on this, and to work aggressively on weight loss and some type of exercise program.  He will follow up with me in one year if doing well.

## 2013-03-05 DIAGNOSIS — I10 Essential (primary) hypertension: Secondary | ICD-10-CM | POA: Diagnosis not present

## 2013-03-11 DIAGNOSIS — E291 Testicular hypofunction: Secondary | ICD-10-CM | POA: Diagnosis not present

## 2013-03-11 DIAGNOSIS — I10 Essential (primary) hypertension: Secondary | ICD-10-CM | POA: Diagnosis not present

## 2013-03-17 DIAGNOSIS — I129 Hypertensive chronic kidney disease with stage 1 through stage 4 chronic kidney disease, or unspecified chronic kidney disease: Secondary | ICD-10-CM | POA: Diagnosis not present

## 2013-03-17 DIAGNOSIS — N183 Chronic kidney disease, stage 3 unspecified: Secondary | ICD-10-CM | POA: Diagnosis not present

## 2013-03-17 DIAGNOSIS — R609 Edema, unspecified: Secondary | ICD-10-CM | POA: Diagnosis not present

## 2013-04-02 DIAGNOSIS — E291 Testicular hypofunction: Secondary | ICD-10-CM | POA: Diagnosis not present

## 2013-04-20 DIAGNOSIS — I10 Essential (primary) hypertension: Secondary | ICD-10-CM | POA: Diagnosis not present

## 2013-04-20 DIAGNOSIS — IMO0001 Reserved for inherently not codable concepts without codable children: Secondary | ICD-10-CM | POA: Diagnosis not present

## 2013-04-20 DIAGNOSIS — E291 Testicular hypofunction: Secondary | ICD-10-CM | POA: Diagnosis not present

## 2013-04-23 DIAGNOSIS — I10 Essential (primary) hypertension: Secondary | ICD-10-CM | POA: Diagnosis not present

## 2013-04-23 DIAGNOSIS — Z23 Encounter for immunization: Secondary | ICD-10-CM | POA: Diagnosis not present

## 2013-04-23 DIAGNOSIS — E291 Testicular hypofunction: Secondary | ICD-10-CM | POA: Diagnosis not present

## 2013-05-18 DIAGNOSIS — E291 Testicular hypofunction: Secondary | ICD-10-CM | POA: Diagnosis not present

## 2013-07-12 DIAGNOSIS — E1129 Type 2 diabetes mellitus with other diabetic kidney complication: Secondary | ICD-10-CM | POA: Diagnosis not present

## 2013-07-12 DIAGNOSIS — N058 Unspecified nephritic syndrome with other morphologic changes: Secondary | ICD-10-CM | POA: Diagnosis not present

## 2013-07-12 DIAGNOSIS — E669 Obesity, unspecified: Secondary | ICD-10-CM | POA: Diagnosis not present

## 2013-07-12 DIAGNOSIS — N183 Chronic kidney disease, stage 3 unspecified: Secondary | ICD-10-CM | POA: Diagnosis not present

## 2013-10-16 ENCOUNTER — Other Ambulatory Visit: Payer: Self-pay | Admitting: Pulmonary Disease

## 2013-10-20 DIAGNOSIS — E291 Testicular hypofunction: Secondary | ICD-10-CM | POA: Diagnosis not present

## 2013-10-20 DIAGNOSIS — I10 Essential (primary) hypertension: Secondary | ICD-10-CM | POA: Diagnosis not present

## 2013-10-20 DIAGNOSIS — E7800 Pure hypercholesterolemia, unspecified: Secondary | ICD-10-CM | POA: Diagnosis not present

## 2013-10-20 DIAGNOSIS — IMO0001 Reserved for inherently not codable concepts without codable children: Secondary | ICD-10-CM | POA: Diagnosis not present

## 2013-10-27 DIAGNOSIS — E1129 Type 2 diabetes mellitus with other diabetic kidney complication: Secondary | ICD-10-CM | POA: Diagnosis not present

## 2013-10-27 DIAGNOSIS — E8779 Other fluid overload: Secondary | ICD-10-CM | POA: Diagnosis not present

## 2013-10-27 DIAGNOSIS — D649 Anemia, unspecified: Secondary | ICD-10-CM | POA: Diagnosis not present

## 2013-10-27 DIAGNOSIS — N183 Chronic kidney disease, stage 3 unspecified: Secondary | ICD-10-CM | POA: Diagnosis not present

## 2013-11-10 DIAGNOSIS — Z0000 Encounter for general adult medical examination without abnormal findings: Secondary | ICD-10-CM | POA: Diagnosis not present

## 2013-11-10 DIAGNOSIS — Z800 Family history of malignant neoplasm of digestive organs: Secondary | ICD-10-CM | POA: Diagnosis not present

## 2013-11-10 DIAGNOSIS — E291 Testicular hypofunction: Secondary | ICD-10-CM | POA: Diagnosis not present

## 2013-11-10 DIAGNOSIS — N289 Disorder of kidney and ureter, unspecified: Secondary | ICD-10-CM | POA: Diagnosis not present

## 2013-11-10 DIAGNOSIS — Z23 Encounter for immunization: Secondary | ICD-10-CM | POA: Diagnosis not present

## 2013-11-16 DIAGNOSIS — J45909 Unspecified asthma, uncomplicated: Secondary | ICD-10-CM | POA: Diagnosis not present

## 2013-11-16 DIAGNOSIS — D72829 Elevated white blood cell count, unspecified: Secondary | ICD-10-CM | POA: Diagnosis not present

## 2013-11-16 DIAGNOSIS — N182 Chronic kidney disease, stage 2 (mild): Secondary | ICD-10-CM | POA: Diagnosis not present

## 2013-11-16 DIAGNOSIS — E7800 Pure hypercholesterolemia, unspecified: Secondary | ICD-10-CM | POA: Diagnosis not present

## 2013-11-16 DIAGNOSIS — N529 Male erectile dysfunction, unspecified: Secondary | ICD-10-CM | POA: Diagnosis not present

## 2013-11-25 DIAGNOSIS — N401 Enlarged prostate with lower urinary tract symptoms: Secondary | ICD-10-CM | POA: Diagnosis not present

## 2013-11-25 DIAGNOSIS — N529 Male erectile dysfunction, unspecified: Secondary | ICD-10-CM | POA: Diagnosis not present

## 2013-11-25 DIAGNOSIS — N139 Obstructive and reflux uropathy, unspecified: Secondary | ICD-10-CM | POA: Diagnosis not present

## 2013-11-25 DIAGNOSIS — N138 Other obstructive and reflux uropathy: Secondary | ICD-10-CM | POA: Diagnosis not present

## 2013-11-25 DIAGNOSIS — E291 Testicular hypofunction: Secondary | ICD-10-CM | POA: Diagnosis not present

## 2013-12-28 DIAGNOSIS — E291 Testicular hypofunction: Secondary | ICD-10-CM | POA: Diagnosis not present

## 2014-01-06 DIAGNOSIS — N529 Male erectile dysfunction, unspecified: Secondary | ICD-10-CM | POA: Diagnosis not present

## 2014-01-13 DIAGNOSIS — N529 Male erectile dysfunction, unspecified: Secondary | ICD-10-CM | POA: Diagnosis not present

## 2014-02-08 DIAGNOSIS — E119 Type 2 diabetes mellitus without complications: Secondary | ICD-10-CM | POA: Diagnosis not present

## 2014-02-28 ENCOUNTER — Encounter: Payer: Self-pay | Admitting: Pulmonary Disease

## 2014-02-28 ENCOUNTER — Ambulatory Visit (INDEPENDENT_AMBULATORY_CARE_PROVIDER_SITE_OTHER): Payer: Medicare Other | Admitting: Pulmonary Disease

## 2014-02-28 VITALS — BP 126/78 | HR 78 | Temp 98.6°F | Ht 75.0 in | Wt 330.8 lb

## 2014-02-28 DIAGNOSIS — J45909 Unspecified asthma, uncomplicated: Secondary | ICD-10-CM

## 2014-02-28 DIAGNOSIS — E291 Testicular hypofunction: Secondary | ICD-10-CM | POA: Diagnosis not present

## 2014-02-28 NOTE — Patient Instructions (Signed)
Stay on your current asthma medications Keep working on weight loss.  You are making progress followup with me again in one year.

## 2014-02-28 NOTE — Assessment & Plan Note (Signed)
The patient is doing very well from an asthma standpoint on his current regimen. I've asked him to stay on his medications daily, and to keep working on aggressive weight loss. I will see him back in one year if doing well.

## 2014-02-28 NOTE — Progress Notes (Signed)
   Subjective:    Patient ID: Brandon KennedyWilliam C Chiquito, male    DOB: 1946-03-26, 68 y.o.   MRN: [id number]  HPI The patient comes in today for followup of his known asthma. He is staying on his maintenance inhaler, and has not had an acute exacerbation since last visit. He has not had to use his rescue inhaler either. He is working on getting his weight down, and has lost weight since the last visit.   Review of Systems  Constitutional: Negative for fever and unexpected weight change.  HENT: Negative for congestion, dental problem, ear pain, nosebleeds, postnasal drip, rhinorrhea, sinus pressure, sneezing, sore throat and trouble swallowing.   Eyes: Negative for redness and itching.  Respiratory: Negative for cough, chest tightness, shortness of breath and wheezing.   Cardiovascular: Negative for palpitations and leg swelling.  Gastrointestinal: Negative for nausea and vomiting.  Genitourinary: Negative for dysuria.  Musculoskeletal: Negative for joint swelling.  Skin: Negative for rash.  Neurological: Negative for headaches.  Hematological: Does not bruise/bleed easily.  Psychiatric/Behavioral: Negative for dysphoric mood. The patient is not nervous/anxious.        Objective:   Physical Exam Obese male in no acute distress Nose without purulence or discharge noted Neck without lymphadenopathy or thyromegaly Chest totally clear to auscultation, no wheezing Cardiac exam with regular rate and rhythm, questionable diastolic murmur Lower extremities with 1+ edema, no cyanosis Alert and oriented, moves all 4 extremities.       Assessment & Plan:

## 2014-04-22 DIAGNOSIS — IMO0001 Reserved for inherently not codable concepts without codable children: Secondary | ICD-10-CM | POA: Diagnosis not present

## 2014-04-22 DIAGNOSIS — E1129 Type 2 diabetes mellitus with other diabetic kidney complication: Secondary | ICD-10-CM | POA: Diagnosis not present

## 2014-04-22 DIAGNOSIS — E291 Testicular hypofunction: Secondary | ICD-10-CM | POA: Diagnosis not present

## 2014-04-27 DIAGNOSIS — L255 Unspecified contact dermatitis due to plants, except food: Secondary | ICD-10-CM | POA: Diagnosis not present

## 2014-06-09 DIAGNOSIS — E291 Testicular hypofunction: Secondary | ICD-10-CM | POA: Diagnosis not present

## 2014-06-09 DIAGNOSIS — E1129 Type 2 diabetes mellitus with other diabetic kidney complication: Secondary | ICD-10-CM | POA: Diagnosis not present

## 2014-06-15 DIAGNOSIS — H109 Unspecified conjunctivitis: Secondary | ICD-10-CM | POA: Diagnosis not present

## 2014-06-16 DIAGNOSIS — N5201 Erectile dysfunction due to arterial insufficiency: Secondary | ICD-10-CM | POA: Diagnosis not present

## 2014-06-21 ENCOUNTER — Other Ambulatory Visit: Payer: Self-pay | Admitting: Pulmonary Disease

## 2014-06-21 DIAGNOSIS — B309 Viral conjunctivitis, unspecified: Secondary | ICD-10-CM | POA: Diagnosis not present

## 2014-06-21 DIAGNOSIS — N183 Chronic kidney disease, stage 3 (moderate): Secondary | ICD-10-CM | POA: Diagnosis not present

## 2014-06-21 DIAGNOSIS — E1129 Type 2 diabetes mellitus with other diabetic kidney complication: Secondary | ICD-10-CM | POA: Diagnosis not present

## 2014-06-21 DIAGNOSIS — E291 Testicular hypofunction: Secondary | ICD-10-CM | POA: Diagnosis not present

## 2014-10-27 DIAGNOSIS — E291 Testicular hypofunction: Secondary | ICD-10-CM | POA: Diagnosis not present

## 2014-10-27 DIAGNOSIS — N5201 Erectile dysfunction due to arterial insufficiency: Secondary | ICD-10-CM | POA: Diagnosis not present

## 2014-10-27 DIAGNOSIS — E780 Pure hypercholesterolemia: Secondary | ICD-10-CM | POA: Diagnosis not present

## 2014-10-27 DIAGNOSIS — E1129 Type 2 diabetes mellitus with other diabetic kidney complication: Secondary | ICD-10-CM | POA: Diagnosis not present

## 2014-10-27 DIAGNOSIS — N183 Chronic kidney disease, stage 3 (moderate): Secondary | ICD-10-CM | POA: Diagnosis not present

## 2014-12-23 DIAGNOSIS — R5383 Other fatigue: Secondary | ICD-10-CM | POA: Diagnosis not present

## 2014-12-23 DIAGNOSIS — Z125 Encounter for screening for malignant neoplasm of prostate: Secondary | ICD-10-CM | POA: Diagnosis not present

## 2014-12-23 DIAGNOSIS — I10 Essential (primary) hypertension: Secondary | ICD-10-CM | POA: Diagnosis not present

## 2014-12-23 DIAGNOSIS — E1129 Type 2 diabetes mellitus with other diabetic kidney complication: Secondary | ICD-10-CM | POA: Diagnosis not present

## 2014-12-23 DIAGNOSIS — E780 Pure hypercholesterolemia: Secondary | ICD-10-CM | POA: Diagnosis not present

## 2014-12-23 DIAGNOSIS — Z0000 Encounter for general adult medical examination without abnormal findings: Secondary | ICD-10-CM | POA: Diagnosis not present

## 2014-12-26 DIAGNOSIS — R5383 Other fatigue: Secondary | ICD-10-CM | POA: Diagnosis not present

## 2014-12-26 DIAGNOSIS — E1129 Type 2 diabetes mellitus with other diabetic kidney complication: Secondary | ICD-10-CM | POA: Diagnosis not present

## 2014-12-26 DIAGNOSIS — I10 Essential (primary) hypertension: Secondary | ICD-10-CM | POA: Diagnosis not present

## 2014-12-26 DIAGNOSIS — E780 Pure hypercholesterolemia: Secondary | ICD-10-CM | POA: Diagnosis not present

## 2015-01-02 DIAGNOSIS — N183 Chronic kidney disease, stage 3 (moderate): Secondary | ICD-10-CM | POA: Diagnosis not present

## 2015-01-02 DIAGNOSIS — I10 Essential (primary) hypertension: Secondary | ICD-10-CM | POA: Diagnosis not present

## 2015-01-02 DIAGNOSIS — E1129 Type 2 diabetes mellitus with other diabetic kidney complication: Secondary | ICD-10-CM | POA: Diagnosis not present

## 2015-01-02 DIAGNOSIS — M159 Polyosteoarthritis, unspecified: Secondary | ICD-10-CM | POA: Diagnosis not present

## 2015-01-09 DIAGNOSIS — F331 Major depressive disorder, recurrent, moderate: Secondary | ICD-10-CM | POA: Diagnosis not present

## 2015-01-25 DIAGNOSIS — H2512 Age-related nuclear cataract, left eye: Secondary | ICD-10-CM | POA: Diagnosis not present

## 2015-01-25 DIAGNOSIS — E119 Type 2 diabetes mellitus without complications: Secondary | ICD-10-CM | POA: Diagnosis not present

## 2015-02-09 DIAGNOSIS — F331 Major depressive disorder, recurrent, moderate: Secondary | ICD-10-CM | POA: Diagnosis not present

## 2015-02-23 ENCOUNTER — Other Ambulatory Visit: Payer: Self-pay | Admitting: *Deleted

## 2015-02-23 MED ORDER — FLUTICASONE-SALMETEROL 250-50 MCG/DOSE IN AEPB: 1.0000 | INHALATION_SPRAY | Freq: Two times a day (BID) | RESPIRATORY_TRACT | Status: DC

## 2015-05-12 ENCOUNTER — Ambulatory Visit (INDEPENDENT_AMBULATORY_CARE_PROVIDER_SITE_OTHER): Payer: Medicare Other | Admitting: Internal Medicine

## 2015-05-12 ENCOUNTER — Encounter: Payer: Self-pay | Admitting: Internal Medicine

## 2015-05-12 VITALS — BP 122/60 | HR 66 | Ht 75.0 in | Wt 307.0 lb

## 2015-05-12 DIAGNOSIS — E669 Obesity, unspecified: Secondary | ICD-10-CM | POA: Insufficient documentation

## 2015-05-12 DIAGNOSIS — E291 Testicular hypofunction: Secondary | ICD-10-CM | POA: Diagnosis not present

## 2015-05-12 DIAGNOSIS — J4540 Moderate persistent asthma, uncomplicated: Secondary | ICD-10-CM

## 2015-05-12 DIAGNOSIS — E780 Pure hypercholesterolemia: Secondary | ICD-10-CM | POA: Diagnosis not present

## 2015-05-12 DIAGNOSIS — I10 Essential (primary) hypertension: Secondary | ICD-10-CM | POA: Diagnosis not present

## 2015-05-12 DIAGNOSIS — E1129 Type 2 diabetes mellitus with other diabetic kidney complication: Secondary | ICD-10-CM | POA: Diagnosis not present

## 2015-05-12 MED ORDER — FLUTICASONE-SALMETEROL 250-50 MCG/DOSE IN AEPB: 1.0000 | INHALATION_SPRAY | Freq: Two times a day (BID) | RESPIRATORY_TRACT | Status: DC

## 2015-05-12 MED ORDER — ALBUTEROL SULFATE HFA 108 (90 BASE) MCG/ACT IN AERS
INHALATION_SPRAY | RESPIRATORY_TRACT | Status: DC
Start: 2015-05-12 — End: 2022-07-09

## 2015-05-12 NOTE — Progress Notes (Signed)
Patient ID: ARMAS MCBEE, male   DOB: May 07, 1946,     MRN: [id number]   Brief patient profile:  72 yowm never smoker previously followed by Dr Shelle Iron with dx of asthma on advair 250 twice daily    History of Present Illness  05/12/2015 1st [facility] office visit/ Primrose Oler re:   Chronic asthma Chief Complaint  Patient presents with  . Follow-up    Former patient of Dr Shelle Iron. Pt states his breathing is doing well. He has never had to use rescue inhaler.    problems :  One episode of sob hs prior to starting advair and none since / chronic doe "all my life"  no change p rx  advair with pfts more restrictive than obstructive 12/25/11 (see a/p) - drives up from [place]/ no rescue on hand "because I never need it"   No obvious day to day or daytime variability or assoc chronic cough or cp or chest tightness, subjective wheeze or overt sinus or hb symptoms. No unusual exp hx or h/o childhood pna/ asthma or knowledge of premature birth.  Sleeping ok without nocturnal  or early am exacerbation  of respiratory  c/o's or need for noct saba. Also denies any obvious fluctuation of symptoms with weather or environmental changes or other aggravating or alleviating factors except as outlined above   Current Medications, Allergies, Complete Past Medical History, Past Surgical History, Family History, and Social History were reviewed in [employer] record.  ROS  The following are not active complaints unless bolded sore throat, dysphagia, dental problems, itching, sneezing,  nasal congestion or excess/ purulent secretions, ear ache,   fever, chills, sweats, unintended wt loss, classically pleuritic or exertional cp, hemoptysis,  orthopnea pnd or leg swelling, presyncope, palpitations, abdominal pain, anorexia, nausea, vomiting, diarrhea  or change in bowel or bladder habits, change in stools or urine, dysuria,hematuria,  rash, arthralgias, visual complaints, headache, numbness, weakness  or ataxia or problems with walking or coordination,  change in mood/affect or memory.      Objective:   Physical Exam    amb obese wm nad    Wt Readings from Last 3 Encounters:  05/12/15 307 lb (139.254 kg)  02/28/14 330 lb 12.8 oz (150.05 kg)  02/12/13 352 lb 12.8 oz (160.029 kg)    Vital signs reviewed        HEENT: nl dentition, turbinates, and orophanx. Nl external ear canals without cough reflex   NECK :  without JVD/Nodes/TM/ nl carotid upstrokes bilaterally   LUNGS: no acc muscle use, clear to A and P bilaterally without cough on insp or exp maneuvers   CV:  RRR  no s3 or murmur or increase in P2, no edema   ABD:  soft and nontender with nl excursion in the supine position. No bruits or organomegaly, bowel sounds nl  MS:  warm without deformities, calf tenderness, cyanosis or clubbing  SKIN: warm and dry without lesions    NEURO:  alert, approp, no deficits            Assessment:

## 2015-05-12 NOTE — Assessment & Plan Note (Addendum)
PFT's 02/01/2008:  FEV1 2.68 (66%) ratio 55 p 59% improvement p saba/  DLCO normal Arlyce Harman   12/25/2011:   FEV1 2.31 (56%), ratio 63   I had an extended discussion with the patient reviewing all relevant studies completed to date and  lasting 15 to 20 minutes of a 25 minute visit   1) doubtful his chronic doe was undertreated asthma as no better on advair and more likely obesity deconditioning   2) All goals of chronic asthma control met including optimal (though not nl due more to obesity than airflow obst) function and elimination of symptoms with minimal need for rescue therapy.  Contingencies discussed in full including contacting this office immediately if not controlling the symptoms using the rule of two's.      3) The proper method of use, as well as anticipated side effects, of a metered-dose inhaler are discussed and demonstrated to the patient.    4) critical he keep the saba handy and if wants to try reducing the advair to once each am that's fine as long as no symptoms.  5) pulmonary f/u is prn   Each maintenance medication was reviewed in detail including most importantly the difference between maintenance and prns and under what circumstances the prns are to be triggered using an action plan format that is not reflected in the computer generated alphabetically organized AVS.    Please see instructions for details which were reviewed in writing and the patient given a copy highlighting the part that I personally wrote and discussed at today's ov.

## 2015-05-12 NOTE — Assessment & Plan Note (Signed)
Body mass index is 38.37 kg/(m^2).  No results found for: TSH   Contributing to gerd  Risk(which can and may have triggered past "asthma attacks" / doe/reviewed the need and the process to achieve and maintain neg calorie balance > defer f/u primary care including intermittently monitoring thyroid status

## 2015-05-12 NOTE — Patient Instructions (Addendum)
Ok to drop the dose of advair to one dose each am if doing well  Only use your albuterol as a rescue medication to be used if you can't catch your breath by resting or doing a relaxed purse lip breathing pattern.  - The less you use it, the better it will work when you need it. - Ok to use up to 2 puffs  every 4 hours if you must but call for immediate appointment if use goes up over your usual need - Don't leave home without it !!  (think of it like the spare tire for your car)     If you are satisfied with your treatment plan,  let your doctor know and he/she can either refill your medications or you can return here when your prescription runs out.     If in any way you are not 100% satisfied,  please tell us.  If 100% better, tell your friends!  Pulmonary follow up is as needed

## 2015-08-08 DIAGNOSIS — J069 Acute upper respiratory infection, unspecified: Secondary | ICD-10-CM | POA: Diagnosis not present

## 2015-08-08 DIAGNOSIS — R05 Cough: Secondary | ICD-10-CM | POA: Diagnosis not present

## 2015-08-08 DIAGNOSIS — J45909 Unspecified asthma, uncomplicated: Secondary | ICD-10-CM | POA: Diagnosis not present

## 2015-10-31 DIAGNOSIS — N5201 Erectile dysfunction due to arterial insufficiency: Secondary | ICD-10-CM | POA: Diagnosis not present

## 2015-10-31 DIAGNOSIS — Z0000 Encounter for general adult medical examination without abnormal findings: Secondary | ICD-10-CM | POA: Diagnosis not present

## 2015-12-27 DIAGNOSIS — Z125 Encounter for screening for malignant neoplasm of prostate: Secondary | ICD-10-CM | POA: Diagnosis not present

## 2015-12-27 DIAGNOSIS — E1129 Type 2 diabetes mellitus with other diabetic kidney complication: Secondary | ICD-10-CM | POA: Diagnosis not present

## 2015-12-27 DIAGNOSIS — Z0000 Encounter for general adult medical examination without abnormal findings: Secondary | ICD-10-CM | POA: Diagnosis not present

## 2015-12-27 DIAGNOSIS — E291 Testicular hypofunction: Secondary | ICD-10-CM | POA: Diagnosis not present

## 2015-12-27 DIAGNOSIS — I10 Essential (primary) hypertension: Secondary | ICD-10-CM | POA: Diagnosis not present

## 2015-12-29 DIAGNOSIS — M170 Bilateral primary osteoarthritis of knee: Secondary | ICD-10-CM | POA: Diagnosis not present

## 2015-12-29 DIAGNOSIS — Z86010 Personal history of colonic polyps: Secondary | ICD-10-CM | POA: Diagnosis not present

## 2015-12-29 DIAGNOSIS — I10 Essential (primary) hypertension: Secondary | ICD-10-CM | POA: Diagnosis not present

## 2015-12-29 DIAGNOSIS — J45909 Unspecified asthma, uncomplicated: Secondary | ICD-10-CM | POA: Diagnosis not present

## 2016-01-08 DIAGNOSIS — E7800 Pure hypercholesterolemia, unspecified: Secondary | ICD-10-CM | POA: Diagnosis not present

## 2016-01-08 DIAGNOSIS — I10 Essential (primary) hypertension: Secondary | ICD-10-CM | POA: Diagnosis not present

## 2016-01-08 DIAGNOSIS — E1129 Type 2 diabetes mellitus with other diabetic kidney complication: Secondary | ICD-10-CM | POA: Diagnosis not present

## 2016-01-08 DIAGNOSIS — E291 Testicular hypofunction: Secondary | ICD-10-CM | POA: Diagnosis not present

## 2016-01-16 DIAGNOSIS — E1129 Type 2 diabetes mellitus with other diabetic kidney complication: Secondary | ICD-10-CM | POA: Diagnosis not present

## 2016-03-13 DIAGNOSIS — S298XXA Other specified injuries of thorax, initial encounter: Secondary | ICD-10-CM | POA: Diagnosis not present

## 2016-03-13 DIAGNOSIS — S4981XA Other specified injuries of right shoulder and upper arm, initial encounter: Secondary | ICD-10-CM | POA: Diagnosis not present

## 2016-03-28 DIAGNOSIS — H35033 Hypertensive retinopathy, bilateral: Secondary | ICD-10-CM | POA: Diagnosis not present

## 2016-04-12 ENCOUNTER — Other Ambulatory Visit: Payer: Self-pay

## 2016-06-27 ENCOUNTER — Other Ambulatory Visit: Payer: Self-pay | Admitting: Internal Medicine

## 2016-07-05 ENCOUNTER — Other Ambulatory Visit: Payer: Self-pay | Admitting: Internal Medicine

## 2016-07-08 ENCOUNTER — Emergency Department (HOSPITAL_COMMUNITY)
Admission: EM | Admit: 2016-07-08 | Discharge: 2016-07-08 | Disposition: A | Discharge status: Home / Self Care | Payer: Medicare Other | Attending: Emergency Medicine | Admitting: Emergency Medicine

## 2016-07-08 ENCOUNTER — Encounter (HOSPITAL_COMMUNITY): Payer: Self-pay | Admitting: Emergency Medicine

## 2016-07-08 ENCOUNTER — Emergency Department (HOSPITAL_COMMUNITY): Payer: Medicare Other

## 2016-07-08 DIAGNOSIS — J45909 Unspecified asthma, uncomplicated: Secondary | ICD-10-CM | POA: Insufficient documentation

## 2016-07-08 DIAGNOSIS — R0789 Other chest pain: Secondary | ICD-10-CM

## 2016-07-08 DIAGNOSIS — D72829 Elevated white blood cell count, unspecified: Secondary | ICD-10-CM

## 2016-07-08 DIAGNOSIS — I10 Essential (primary) hypertension: Secondary | ICD-10-CM | POA: Diagnosis not present

## 2016-07-08 DIAGNOSIS — R079 Chest pain, unspecified: Secondary | ICD-10-CM | POA: Diagnosis not present

## 2016-07-08 DIAGNOSIS — E119 Type 2 diabetes mellitus without complications: Secondary | ICD-10-CM | POA: Insufficient documentation

## 2016-07-08 DIAGNOSIS — R0602 Shortness of breath: Secondary | ICD-10-CM | POA: Diagnosis not present

## 2016-07-08 DIAGNOSIS — Z794 Long term (current) use of insulin: Secondary | ICD-10-CM | POA: Diagnosis not present

## 2016-07-08 DIAGNOSIS — Z7982 Long term (current) use of aspirin: Secondary | ICD-10-CM | POA: Insufficient documentation

## 2016-07-08 DIAGNOSIS — R072 Precordial pain: Secondary | ICD-10-CM | POA: Diagnosis present

## 2016-07-08 HISTORY — DX: Unspecified asthma, uncomplicated: J45.909

## 2016-07-08 LAB — I-STAT TROPONIN, ED
Troponin i, poc: 0 ng/mL (ref 0.00–0.08)
Troponin i, poc: 0.01 ng/mL (ref 0.00–0.08)

## 2016-07-08 LAB — COMPREHENSIVE METABOLIC PANEL
ALT: 16 U/L — ABNORMAL LOW (ref 17–63)
AST: 17 U/L (ref 15–41)
Albumin: 4.1 g/dL (ref 3.5–5.0)
Alkaline Phosphatase: 76 U/L (ref 38–126)
Anion gap: 12 (ref 5–15)
BUN: 21 mg/dL — ABNORMAL HIGH (ref 6–20)
CO2: 25 mmol/L (ref 22–32)
Calcium: 9.1 mg/dL (ref 8.9–10.3)
Chloride: 104 mmol/L (ref 101–111)
Creatinine, Ser: 1.06 mg/dL (ref 0.61–1.24)
GFR calc Af Amer: >60 mL/min (ref >60)
GFR calc non Af Amer: >60 mL/min (ref >60)
Glucose, Bld: 182 mg/dL — ABNORMAL HIGH (ref 65–99)
Potassium: 3.3 mmol/L — ABNORMAL LOW (ref 3.5–5.1)
Sodium: 141 mmol/L (ref 135–145)
Total Bilirubin: 1.2 mg/dL (ref 0.3–1.2)
Total Protein: 7.8 g/dL (ref 6.5–8.1)

## 2016-07-08 LAB — CBC
HCT: 39 % (ref 39.0–52.0)
Hemoglobin: 12.8 g/dL — ABNORMAL LOW (ref 13.0–17.0)
MCH: 32.9 pg (ref 26.0–34.0)
MCHC: 32.8 g/dL (ref 30.0–36.0)
MCV: 100.3 fL — ABNORMAL HIGH (ref 78.0–100.0)
Platelets: 202 10*3/uL (ref 150–400)
RBC: 3.89 MIL/uL — ABNORMAL LOW (ref 4.22–5.81)
RDW: 13.9 % (ref 11.5–15.5)
WBC: 15.4 10*3/uL — ABNORMAL HIGH (ref 4.0–10.5)

## 2016-07-08 LAB — BRAIN NATRIURETIC PEPTIDE: B Natriuretic Peptide: 159 pg/mL — ABNORMAL HIGH (ref 0.0–100.0)

## 2016-07-08 LAB — D-DIMER, QUANTITATIVE: D-Dimer, Quant: 0.55 ug/mL-FEU — ABNORMAL HIGH (ref 0.00–0.50)

## 2016-07-08 MED ORDER — IPRATROPIUM-ALBUTEROL 0.5-2.5 (3) MG/3ML IN SOLN
3.0000 mL | Freq: Once | RESPIRATORY_TRACT | Status: AC
  Administered 2016-07-08: 3 mL via RESPIRATORY_TRACT
  Filled 2016-07-08: qty 3

## 2016-07-08 NOTE — ED Provider Notes (Signed)
[facility]ER[facility]ENC[facility] DEPT Provider Note   CSN: [id number] Arrival date & time: 07/08/16  [id number]     History   Chief Complaint Chief Complaint  Patient presents with  . Chest Pain    HPI Brandon Sanchez is a 70 y.o. male.  The history is provided by the patient.  Chest Pain   This is a new problem. The current episode started 2 days ago. The problem occurs constantly. The problem has not changed since onset.The pain is present in the substernal region. The pain radiates to the left neck and left shoulder. The symptoms are aggravated by certain positions. Associated symptoms include cough (baseline), lower extremity edema, orthopnea and shortness of breath. Pertinent negatives include no fever, no hemoptysis, no malaise/fatigue, no nausea, no sputum production and no vomiting. Risk factors include male gender, obesity and being elderly.  His past medical history is significant for diabetes, hyperlipidemia and hypertension.  Pertinent negatives for past medical history include no arrhythmia, no CAD, no cancer, no congenital heart disease, no COPD, no CHF, no MI and no PE.  Procedure history is positive for cardiac catheterization (10 yrs ago).    Past Medical History:  Diagnosis Date  . Allergic rhinitis, cause unspecified   . Asthma   . Other and unspecified hyperlipidemia   . Type II or unspecified type diabetes mellitus without mention of complication, not stated as uncontrolled   . Unspecified essential hypertension     Patient Active Problem List   Diagnosis Date Noted  . Obesity 05/12/2015  . DM 02/04/2008  . HYPERLIPIDEMIA 02/04/2008  . HYPERTENSION 02/04/2008  . ALLERGIC RHINITIS 02/04/2008  . 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 02/04/2008    History reviewed. No pertinent surgical history.     Home Medications    Prior to Admission medications   Medication Sig Start Date End Date Taking? Authorizing Provider  acetaminophen (TYLENOL) 500 MG  tablet Take 500 mg by mouth See admin instructions. 1500mg  in the morning before lunch, then 1500mg  at bedtime   Yes Historical Provider, MD  albuterol (PROAIR HFA) 108 (90 BASE) MCG/ACT inhaler 2 puffs every 4 hours as needed only  if your can't catch your breath 05/12/15  Yes Nyoka CowdenMichael B Wert, MD  aspirin 81 MG tablet Take 81 mg by mouth daily.     Yes Historical Provider, MD  atorvastatin (LIPITOR) 40 MG tablet Take 40 mg by mouth daily.     Yes Historical Provider, MD  cetirizine-pseudoephedrine (ZYRTEC-D) 5-120 MG per tablet Take 1 tablet by mouth daily as needed.   Yes Historical Provider, MD  Fluticasone-Salmeterol (ADVAIR) 100-50 MCG/DOSE AEPB Inhale 1 puff into the lungs daily.   Yes Historical Provider, MD  furosemide (LASIX) 40 MG tablet 40 mg 3 (three) times daily. 2 tablets in AM and 1 tablet midday.   Yes Historical Provider, MD  Glucosamine-Chondroit-Vit C-Mn (GLUCOSAMINE CHONDR 1500 COMPLX) CAPS Take 1 capsule by mouth 2 (two) times daily.   Yes Historical Provider, MD  insulin aspart protamine- aspart (NOVOLOG MIX 70/30) (70-30) 100 UNIT/ML injection Inject 40 Units into the skin 2 (two) times daily at 8 am and 10 pm.   Yes Historical Provider, MD  Multiple Vitamin (MULTIVITAMIN WITH MINERALS) TABS tablet Take 1 tablet by mouth daily.   Yes Historical Provider, MD  omeprazole (PRILOSEC) 40 MG capsule Take 40 mg by mouth daily as needed.    Yes Historical Provider, MD  sertraline (ZOLOFT) 100 MG tablet Take 100 mg by mouth daily.  Yes Historical Provider, MD  valsartan (DIOVAN) 80 MG tablet Take 80 mg by mouth daily.   Yes Historical Provider, MD    Family History Family History  Problem Relation Age of Onset  . Colon cancer Father   . Heart disease Paternal Grandfather     Social History Social History  Substance Use Topics  . Smoking status: Never Smoker  . Smokeless tobacco: Never Used  . Alcohol use 1.8 oz/week    3 Glasses of wine per week     Allergies   Patient  has no known allergies.   Review of Systems Review of Systems  Constitutional: Negative for fever and malaise/fatigue.  Respiratory: Positive for cough (baseline) and shortness of breath. Negative for hemoptysis and sputum production.   Cardiovascular: Positive for chest pain and orthopnea.  Gastrointestinal: Negative for nausea and vomiting.  Ten systems are reviewed and are negative for acute change except as noted in the HPI    Physical Exam Updated Vital Signs BP 119/56 (BP Location: Right Arm)   Pulse 103   Resp 18   Ht 6\' 2"  (1.88 m)   Wt 300 lb (136.1 kg)   SpO2 95%   BMI 38.52 kg/m   Physical Exam  Constitutional: He is oriented to person, place, and time. He appears well-developed and well-nourished. No distress.  HENT:  Head: Normocephalic and atraumatic.  Nose: Nose normal.  Eyes: Conjunctivae and EOM are normal. Pupils are equal, round, and reactive to light. Right eye exhibits no discharge. Left eye exhibits no discharge. No scleral icterus.  Neck: Normal range of motion. Neck supple.  Cardiovascular: Normal rate and regular rhythm.  Exam reveals no gallop and no friction rub.   No murmur heard. Pulmonary/Chest: Effort normal and breath sounds normal. No stridor. No respiratory distress. He has no rales.  Abdominal: Soft. He exhibits no distension. There is no tenderness.  Musculoskeletal: He exhibits no edema or tenderness.  Neurological: He is alert and oriented to person, place, and time.  Skin: Skin is warm and dry. No rash noted. He is not diaphoretic. No erythema.  Psychiatric: He has a normal mood and affect.  Vitals reviewed.    ED Treatments / Results  Labs (all labs ordered are listed, but only abnormal results are displayed) Labs Reviewed  CBC - Abnormal; Notable for the following:       Result Value   WBC 15.4 (*)    RBC 3.89 (*)    Hemoglobin 12.8 (*)    MCV 100.3 (*)    All other components within normal limits  COMPREHENSIVE METABOLIC  PANEL - Abnormal; Notable for the following:    Potassium 3.3 (*)    Glucose, Bld 182 (*)    BUN 21 (*)    ALT 16 (*)    All other components within normal limits  BRAIN NATRIURETIC PEPTIDE - Abnormal; Notable for the following:    B Natriuretic Peptide 159.0 (*)    All other components within normal limits  D-DIMER, QUANTITATIVE (NOT AT [facility]) - Abnormal; Notable for the following:    D-Dimer, Quant 0.55 (*)    All other components within normal limits  I-STAT TROPOININ, ED  I-STAT TROPOININ, ED    EKG  EKG Interpretation  Date/Time:  Monday July 08 2016 09:32:32 EST Ventricular Rate:  105 PR Interval:    QRS Duration: 103 QT Interval:  340 QTC Calculation: 450 R Axis:   -52 Text Interpretation:  Abnormal R-wave progression, early transition Artifact No  old tracing to compare Confirmed by [facility] Hospital[facility]ARD[facility] MD, Tuwana Kapaun [id number]) on 07/08/2016 9:47:06 AM       Radiology Dg Chest 2 View  Result Date: 07/08/2016 CLINICAL DATA:  Progressive left mid chest pain. EXAM: CHEST  2 VIEW COMPARISON:  None. FINDINGS: The heart size and mediastinal contours are within normal limits. Both lungs are clear. The visualized skeletal structures are unremarkable. IMPRESSION: Normal chest. Electronically Signed   By: Francene BoyersJames  Maxwell M.D.   On: 07/08/2016 10:05    Procedures Procedures (including critical care time)  Medications Ordered in ED Medications  ipratropium-albuterol (DUONEB) 0.5-2.5 (3) MG/3ML nebulizer solution 3 mL (3 mLs Nebulization Given 07/08/16 1052)     Initial Impression / Assessment and Plan / ED Course  I have reviewed the triage vital signs and the nursing notes.  Pertinent labs & imaging results that were available during my care of the patient were reviewed by me and considered in my medical decision making (see chart for details).  Clinical Course     Atypical chest pain that has been persistent for several days. EKG without acute ischemic changes or evidence of  pericarditis. Initial troponin negative. Given the persistence of the pain feel this is highly inconsistent with ACS however will obtain a second troponin. Second troponin was negative. Chest x-ray without evidence suggestive of pneumonia, pneumothorax, pneumomediastinum.  No abnormal contour of the mediastinum to suggest dissection. No evidence of acute injuries. Patient denied any infectious symptoms however he does have leukocytosis which may be secondary to inhaled corticosteroids. No previous CBC for comparison. Patient with mild lower extremity edema however no evidence to suggest new onset heart failure. [facility] and tachy[facility]; d-dimer positive however below the age suggesting threshold. Feel that pulmonary embolism is unlikely. Presentation not classic for aortic dissection or esophageal perforation.  Patient's symptoms gradually improved following arrival without intervention.  Given his leukocytosis patient was instructed to follow-up with his primary care provider for recheck.  Safe for discharge with strict return precautions.  Final Clinical Impressions(s) / ED Diagnoses   Final diagnoses:  Atypical chest pain  Leukocytosis, unspecified type   Disposition: Discharge  Condition: Good  I have discussed the results, Dx and Tx plan with the patient who expressed understanding and agree(s) with the plan. Discharge instructions discussed at great length. The patient was given strict return precautions who verbalized understanding of the instructions. No further questions at time of discharge.    Discharge Medication List as of 07/08/2016  2:58 PM      Follow Up: Merri BrunetteWalter Pharr, MD [address] [place] [id number] [phone]  Schedule an appointment as soon as possible for a visit  in 5-7 days, If symptoms do not improve or  worsen      Nira ConnPedro Eduardo Chantrell Apsey, MD 07/08/16 1706

## 2016-07-08 NOTE — ED Triage Notes (Signed)
patient c/o upper central chest pain that radiates to left arm since Saturday. Patient feels more SOB than normal.

## 2016-07-08 NOTE — ED Notes (Signed)
Made respiratory aware of neb treatment ordered 

## 2016-09-10 ENCOUNTER — Other Ambulatory Visit: Payer: Self-pay | Admitting: Internal Medicine

## 2016-11-11 DIAGNOSIS — E1129 Type 2 diabetes mellitus with other diabetic kidney complication: Secondary | ICD-10-CM | POA: Diagnosis not present

## 2016-11-11 DIAGNOSIS — I10 Essential (primary) hypertension: Secondary | ICD-10-CM | POA: Diagnosis not present

## 2016-11-11 DIAGNOSIS — E7800 Pure hypercholesterolemia, unspecified: Secondary | ICD-10-CM | POA: Diagnosis not present

## 2016-12-25 DIAGNOSIS — Z0000 Encounter for general adult medical examination without abnormal findings: Secondary | ICD-10-CM | POA: Diagnosis not present

## 2016-12-25 DIAGNOSIS — E1129 Type 2 diabetes mellitus with other diabetic kidney complication: Secondary | ICD-10-CM | POA: Diagnosis not present

## 2016-12-25 DIAGNOSIS — E7800 Pure hypercholesterolemia, unspecified: Secondary | ICD-10-CM | POA: Diagnosis not present

## 2016-12-25 DIAGNOSIS — Z125 Encounter for screening for malignant neoplasm of prostate: Secondary | ICD-10-CM | POA: Diagnosis not present

## 2016-12-30 DIAGNOSIS — M2550 Pain in unspecified joint: Secondary | ICD-10-CM | POA: Diagnosis not present

## 2016-12-30 DIAGNOSIS — E782 Mixed hyperlipidemia: Secondary | ICD-10-CM | POA: Diagnosis not present

## 2016-12-30 DIAGNOSIS — M7070 Other bursitis of hip, unspecified hip: Secondary | ICD-10-CM | POA: Diagnosis not present

## 2016-12-30 DIAGNOSIS — Z1212 Encounter for screening for malignant neoplasm of rectum: Secondary | ICD-10-CM | POA: Diagnosis not present

## 2016-12-30 DIAGNOSIS — J45909 Unspecified asthma, uncomplicated: Secondary | ICD-10-CM | POA: Diagnosis not present

## 2017-01-02 DIAGNOSIS — M6281 Muscle weakness (generalized): Secondary | ICD-10-CM | POA: Diagnosis not present

## 2017-01-02 DIAGNOSIS — R7982 Elevated C-reactive protein (CRP): Secondary | ICD-10-CM | POA: Diagnosis not present

## 2017-01-02 DIAGNOSIS — M47816 Spondylosis without myelopathy or radiculopathy, lumbar region: Secondary | ICD-10-CM | POA: Diagnosis not present

## 2017-01-02 DIAGNOSIS — M2550 Pain in unspecified joint: Secondary | ICD-10-CM | POA: Diagnosis not present

## 2017-01-02 DIAGNOSIS — M1990 Unspecified osteoarthritis, unspecified site: Secondary | ICD-10-CM | POA: Diagnosis not present

## 2017-01-02 DIAGNOSIS — M791 Myalgia: Secondary | ICD-10-CM | POA: Diagnosis not present

## 2017-01-02 DIAGNOSIS — M160 Bilateral primary osteoarthritis of hip: Secondary | ICD-10-CM | POA: Diagnosis not present

## 2017-01-02 DIAGNOSIS — R700 Elevated erythrocyte sedimentation rate: Secondary | ICD-10-CM | POA: Diagnosis not present

## 2017-01-02 DIAGNOSIS — M1712 Unilateral primary osteoarthritis, left knee: Secondary | ICD-10-CM | POA: Diagnosis not present

## 2017-01-02 DIAGNOSIS — M1711 Unilateral primary osteoarthritis, right knee: Secondary | ICD-10-CM | POA: Diagnosis not present

## 2017-01-15 DIAGNOSIS — R7982 Elevated C-reactive protein (CRP): Secondary | ICD-10-CM | POA: Diagnosis not present

## 2017-01-15 DIAGNOSIS — M2550 Pain in unspecified joint: Secondary | ICD-10-CM | POA: Diagnosis not present

## 2017-01-15 DIAGNOSIS — M1990 Unspecified osteoarthritis, unspecified site: Secondary | ICD-10-CM | POA: Diagnosis not present

## 2017-01-15 DIAGNOSIS — M6281 Muscle weakness (generalized): Secondary | ICD-10-CM | POA: Diagnosis not present

## 2017-01-23 DIAGNOSIS — M6250 Muscle wasting and atrophy, not elsewhere classified, unspecified site: Secondary | ICD-10-CM | POA: Diagnosis not present

## 2017-01-23 DIAGNOSIS — R262 Difficulty in walking, not elsewhere classified: Secondary | ICD-10-CM | POA: Diagnosis not present

## 2017-01-23 DIAGNOSIS — M25562 Pain in left knee: Secondary | ICD-10-CM | POA: Diagnosis not present

## 2017-01-23 DIAGNOSIS — M25561 Pain in right knee: Secondary | ICD-10-CM | POA: Diagnosis not present

## 2017-01-30 DIAGNOSIS — M6250 Muscle wasting and atrophy, not elsewhere classified, unspecified site: Secondary | ICD-10-CM | POA: Diagnosis not present

## 2017-01-30 DIAGNOSIS — M25562 Pain in left knee: Secondary | ICD-10-CM | POA: Diagnosis not present

## 2017-01-30 DIAGNOSIS — R262 Difficulty in walking, not elsewhere classified: Secondary | ICD-10-CM | POA: Diagnosis not present

## 2017-01-30 DIAGNOSIS — M25561 Pain in right knee: Secondary | ICD-10-CM | POA: Diagnosis not present

## 2017-02-04 DIAGNOSIS — R262 Difficulty in walking, not elsewhere classified: Secondary | ICD-10-CM | POA: Diagnosis not present

## 2017-02-04 DIAGNOSIS — M25562 Pain in left knee: Secondary | ICD-10-CM | POA: Diagnosis not present

## 2017-02-04 DIAGNOSIS — M6250 Muscle wasting and atrophy, not elsewhere classified, unspecified site: Secondary | ICD-10-CM | POA: Diagnosis not present

## 2017-02-04 DIAGNOSIS — M25561 Pain in right knee: Secondary | ICD-10-CM | POA: Diagnosis not present

## 2017-02-18 DIAGNOSIS — M1990 Unspecified osteoarthritis, unspecified site: Secondary | ICD-10-CM | POA: Diagnosis not present

## 2017-02-18 DIAGNOSIS — M2550 Pain in unspecified joint: Secondary | ICD-10-CM | POA: Diagnosis not present

## 2017-02-18 DIAGNOSIS — R7982 Elevated C-reactive protein (CRP): Secondary | ICD-10-CM | POA: Diagnosis not present

## 2017-02-18 DIAGNOSIS — M6281 Muscle weakness (generalized): Secondary | ICD-10-CM | POA: Diagnosis not present

## 2017-03-11 DIAGNOSIS — E1129 Type 2 diabetes mellitus with other diabetic kidney complication: Secondary | ICD-10-CM | POA: Diagnosis not present

## 2017-03-11 DIAGNOSIS — M25561 Pain in right knee: Secondary | ICD-10-CM | POA: Diagnosis not present

## 2017-03-11 DIAGNOSIS — M6250 Muscle wasting and atrophy, not elsewhere classified, unspecified site: Secondary | ICD-10-CM | POA: Diagnosis not present

## 2017-03-11 DIAGNOSIS — R262 Difficulty in walking, not elsewhere classified: Secondary | ICD-10-CM | POA: Diagnosis not present

## 2017-03-11 DIAGNOSIS — M25562 Pain in left knee: Secondary | ICD-10-CM | POA: Diagnosis not present

## 2017-03-11 DIAGNOSIS — E7800 Pure hypercholesterolemia, unspecified: Secondary | ICD-10-CM | POA: Diagnosis not present

## 2017-03-11 DIAGNOSIS — I10 Essential (primary) hypertension: Secondary | ICD-10-CM | POA: Diagnosis not present

## 2017-03-13 DIAGNOSIS — M25562 Pain in left knee: Secondary | ICD-10-CM | POA: Diagnosis not present

## 2017-03-13 DIAGNOSIS — M25561 Pain in right knee: Secondary | ICD-10-CM | POA: Diagnosis not present

## 2017-03-13 DIAGNOSIS — M6250 Muscle wasting and atrophy, not elsewhere classified, unspecified site: Secondary | ICD-10-CM | POA: Diagnosis not present

## 2017-03-13 DIAGNOSIS — R262 Difficulty in walking, not elsewhere classified: Secondary | ICD-10-CM | POA: Diagnosis not present

## 2017-03-25 DIAGNOSIS — M25561 Pain in right knee: Secondary | ICD-10-CM | POA: Diagnosis not present

## 2017-03-25 DIAGNOSIS — R262 Difficulty in walking, not elsewhere classified: Secondary | ICD-10-CM | POA: Diagnosis not present

## 2017-03-25 DIAGNOSIS — M6250 Muscle wasting and atrophy, not elsewhere classified, unspecified site: Secondary | ICD-10-CM | POA: Diagnosis not present

## 2017-03-25 DIAGNOSIS — M25562 Pain in left knee: Secondary | ICD-10-CM | POA: Diagnosis not present

## 2017-03-27 DIAGNOSIS — M6250 Muscle wasting and atrophy, not elsewhere classified, unspecified site: Secondary | ICD-10-CM | POA: Diagnosis not present

## 2017-03-27 DIAGNOSIS — E782 Mixed hyperlipidemia: Secondary | ICD-10-CM | POA: Diagnosis not present

## 2017-03-27 DIAGNOSIS — N183 Chronic kidney disease, stage 3 (moderate): Secondary | ICD-10-CM | POA: Diagnosis not present

## 2017-03-27 DIAGNOSIS — M25561 Pain in right knee: Secondary | ICD-10-CM | POA: Diagnosis not present

## 2017-03-27 DIAGNOSIS — I10 Essential (primary) hypertension: Secondary | ICD-10-CM | POA: Diagnosis not present

## 2017-03-27 DIAGNOSIS — M25562 Pain in left knee: Secondary | ICD-10-CM | POA: Diagnosis not present

## 2017-03-27 DIAGNOSIS — R262 Difficulty in walking, not elsewhere classified: Secondary | ICD-10-CM | POA: Diagnosis not present

## 2017-03-27 DIAGNOSIS — E1129 Type 2 diabetes mellitus with other diabetic kidney complication: Secondary | ICD-10-CM | POA: Diagnosis not present

## 2017-04-01 DIAGNOSIS — M25562 Pain in left knee: Secondary | ICD-10-CM | POA: Diagnosis not present

## 2017-04-01 DIAGNOSIS — M6250 Muscle wasting and atrophy, not elsewhere classified, unspecified site: Secondary | ICD-10-CM | POA: Diagnosis not present

## 2017-04-01 DIAGNOSIS — R262 Difficulty in walking, not elsewhere classified: Secondary | ICD-10-CM | POA: Diagnosis not present

## 2017-04-01 DIAGNOSIS — M25561 Pain in right knee: Secondary | ICD-10-CM | POA: Diagnosis not present

## 2017-04-03 DIAGNOSIS — M6250 Muscle wasting and atrophy, not elsewhere classified, unspecified site: Secondary | ICD-10-CM | POA: Diagnosis not present

## 2017-04-03 DIAGNOSIS — M25561 Pain in right knee: Secondary | ICD-10-CM | POA: Diagnosis not present

## 2017-04-03 DIAGNOSIS — M25562 Pain in left knee: Secondary | ICD-10-CM | POA: Diagnosis not present

## 2017-04-03 DIAGNOSIS — R262 Difficulty in walking, not elsewhere classified: Secondary | ICD-10-CM | POA: Diagnosis not present

## 2017-04-08 DIAGNOSIS — M25561 Pain in right knee: Secondary | ICD-10-CM | POA: Diagnosis not present

## 2017-04-08 DIAGNOSIS — M6250 Muscle wasting and atrophy, not elsewhere classified, unspecified site: Secondary | ICD-10-CM | POA: Diagnosis not present

## 2017-04-08 DIAGNOSIS — M25562 Pain in left knee: Secondary | ICD-10-CM | POA: Diagnosis not present

## 2017-04-08 DIAGNOSIS — R262 Difficulty in walking, not elsewhere classified: Secondary | ICD-10-CM | POA: Diagnosis not present

## 2017-04-10 DIAGNOSIS — R262 Difficulty in walking, not elsewhere classified: Secondary | ICD-10-CM | POA: Diagnosis not present

## 2017-04-10 DIAGNOSIS — M6250 Muscle wasting and atrophy, not elsewhere classified, unspecified site: Secondary | ICD-10-CM | POA: Diagnosis not present

## 2017-04-10 DIAGNOSIS — M25562 Pain in left knee: Secondary | ICD-10-CM | POA: Diagnosis not present

## 2017-04-10 DIAGNOSIS — M25561 Pain in right knee: Secondary | ICD-10-CM | POA: Diagnosis not present

## 2017-04-15 DIAGNOSIS — R262 Difficulty in walking, not elsewhere classified: Secondary | ICD-10-CM | POA: Diagnosis not present

## 2017-04-15 DIAGNOSIS — M25561 Pain in right knee: Secondary | ICD-10-CM | POA: Diagnosis not present

## 2017-04-15 DIAGNOSIS — M25562 Pain in left knee: Secondary | ICD-10-CM | POA: Diagnosis not present

## 2017-04-15 DIAGNOSIS — M6250 Muscle wasting and atrophy, not elsewhere classified, unspecified site: Secondary | ICD-10-CM | POA: Diagnosis not present

## 2017-04-17 DIAGNOSIS — M25561 Pain in right knee: Secondary | ICD-10-CM | POA: Diagnosis not present

## 2017-04-17 DIAGNOSIS — R262 Difficulty in walking, not elsewhere classified: Secondary | ICD-10-CM | POA: Diagnosis not present

## 2017-04-17 DIAGNOSIS — M25562 Pain in left knee: Secondary | ICD-10-CM | POA: Diagnosis not present

## 2017-04-17 DIAGNOSIS — M6250 Muscle wasting and atrophy, not elsewhere classified, unspecified site: Secondary | ICD-10-CM | POA: Diagnosis not present

## 2017-04-22 DIAGNOSIS — R262 Difficulty in walking, not elsewhere classified: Secondary | ICD-10-CM | POA: Diagnosis not present

## 2017-04-22 DIAGNOSIS — M6250 Muscle wasting and atrophy, not elsewhere classified, unspecified site: Secondary | ICD-10-CM | POA: Diagnosis not present

## 2017-04-22 DIAGNOSIS — M25562 Pain in left knee: Secondary | ICD-10-CM | POA: Diagnosis not present

## 2017-04-22 DIAGNOSIS — M25561 Pain in right knee: Secondary | ICD-10-CM | POA: Diagnosis not present

## 2017-04-24 DIAGNOSIS — M25561 Pain in right knee: Secondary | ICD-10-CM | POA: Diagnosis not present

## 2017-04-24 DIAGNOSIS — R262 Difficulty in walking, not elsewhere classified: Secondary | ICD-10-CM | POA: Diagnosis not present

## 2017-04-24 DIAGNOSIS — M6250 Muscle wasting and atrophy, not elsewhere classified, unspecified site: Secondary | ICD-10-CM | POA: Diagnosis not present

## 2017-04-24 DIAGNOSIS — M25562 Pain in left knee: Secondary | ICD-10-CM | POA: Diagnosis not present

## 2017-04-25 DIAGNOSIS — M2550 Pain in unspecified joint: Secondary | ICD-10-CM | POA: Diagnosis not present

## 2017-04-25 DIAGNOSIS — M6281 Muscle weakness (generalized): Secondary | ICD-10-CM | POA: Diagnosis not present

## 2017-04-25 DIAGNOSIS — Z23 Encounter for immunization: Secondary | ICD-10-CM | POA: Diagnosis not present

## 2017-04-25 DIAGNOSIS — R7982 Elevated C-reactive protein (CRP): Secondary | ICD-10-CM | POA: Diagnosis not present

## 2017-04-25 DIAGNOSIS — M1990 Unspecified osteoarthritis, unspecified site: Secondary | ICD-10-CM | POA: Diagnosis not present

## 2017-05-06 DIAGNOSIS — R262 Difficulty in walking, not elsewhere classified: Secondary | ICD-10-CM | POA: Diagnosis not present

## 2017-05-06 DIAGNOSIS — M25562 Pain in left knee: Secondary | ICD-10-CM | POA: Diagnosis not present

## 2017-05-06 DIAGNOSIS — M25561 Pain in right knee: Secondary | ICD-10-CM | POA: Diagnosis not present

## 2017-05-06 DIAGNOSIS — M6250 Muscle wasting and atrophy, not elsewhere classified, unspecified site: Secondary | ICD-10-CM | POA: Diagnosis not present

## 2017-05-08 DIAGNOSIS — M6250 Muscle wasting and atrophy, not elsewhere classified, unspecified site: Secondary | ICD-10-CM | POA: Diagnosis not present

## 2017-05-08 DIAGNOSIS — M25561 Pain in right knee: Secondary | ICD-10-CM | POA: Diagnosis not present

## 2017-05-08 DIAGNOSIS — R262 Difficulty in walking, not elsewhere classified: Secondary | ICD-10-CM | POA: Diagnosis not present

## 2017-05-08 DIAGNOSIS — M25562 Pain in left knee: Secondary | ICD-10-CM | POA: Diagnosis not present

## 2017-05-27 DIAGNOSIS — E119 Type 2 diabetes mellitus without complications: Secondary | ICD-10-CM | POA: Diagnosis not present

## 2017-06-16 DIAGNOSIS — R29898 Other symptoms and signs involving the musculoskeletal system: Secondary | ICD-10-CM | POA: Diagnosis not present

## 2017-09-11 DIAGNOSIS — L720 Epidermal cyst: Secondary | ICD-10-CM | POA: Diagnosis not present

## 2017-09-11 DIAGNOSIS — J4540 Moderate persistent asthma, uncomplicated: Secondary | ICD-10-CM | POA: Diagnosis not present

## 2017-09-11 DIAGNOSIS — L918 Other hypertrophic disorders of the skin: Secondary | ICD-10-CM | POA: Diagnosis not present

## 2017-09-11 DIAGNOSIS — H9193 Unspecified hearing loss, bilateral: Secondary | ICD-10-CM | POA: Diagnosis not present

## 2017-09-18 DIAGNOSIS — B078 Other viral warts: Secondary | ICD-10-CM | POA: Diagnosis not present

## 2017-09-18 DIAGNOSIS — L720 Epidermal cyst: Secondary | ICD-10-CM | POA: Diagnosis not present

## 2018-01-05 DIAGNOSIS — Z125 Encounter for screening for malignant neoplasm of prostate: Secondary | ICD-10-CM | POA: Diagnosis not present

## 2018-01-05 DIAGNOSIS — E7800 Pure hypercholesterolemia, unspecified: Secondary | ICD-10-CM | POA: Diagnosis not present

## 2018-01-05 DIAGNOSIS — E1129 Type 2 diabetes mellitus with other diabetic kidney complication: Secondary | ICD-10-CM | POA: Diagnosis not present

## 2018-01-05 DIAGNOSIS — I10 Essential (primary) hypertension: Secondary | ICD-10-CM | POA: Diagnosis not present

## 2018-01-08 DIAGNOSIS — Z0000 Encounter for general adult medical examination without abnormal findings: Secondary | ICD-10-CM | POA: Diagnosis not present

## 2018-01-08 DIAGNOSIS — J4540 Moderate persistent asthma, uncomplicated: Secondary | ICD-10-CM | POA: Diagnosis not present

## 2018-01-08 DIAGNOSIS — Z1212 Encounter for screening for malignant neoplasm of rectum: Secondary | ICD-10-CM | POA: Diagnosis not present

## 2018-01-08 DIAGNOSIS — E782 Mixed hyperlipidemia: Secondary | ICD-10-CM | POA: Diagnosis not present

## 2018-01-08 DIAGNOSIS — E1129 Type 2 diabetes mellitus with other diabetic kidney complication: Secondary | ICD-10-CM | POA: Diagnosis not present

## 2018-01-08 DIAGNOSIS — I10 Essential (primary) hypertension: Secondary | ICD-10-CM | POA: Diagnosis not present

## 2018-01-08 DIAGNOSIS — R3915 Urgency of urination: Secondary | ICD-10-CM | POA: Diagnosis not present

## 2018-01-08 DIAGNOSIS — Z23 Encounter for immunization: Secondary | ICD-10-CM | POA: Diagnosis not present

## 2018-01-08 DIAGNOSIS — N183 Chronic kidney disease, stage 3 (moderate): Secondary | ICD-10-CM | POA: Diagnosis not present

## 2018-01-08 DIAGNOSIS — F331 Major depressive disorder, recurrent, moderate: Secondary | ICD-10-CM | POA: Diagnosis not present

## 2018-01-08 DIAGNOSIS — D72829 Elevated white blood cell count, unspecified: Secondary | ICD-10-CM | POA: Diagnosis not present

## 2018-01-08 DIAGNOSIS — H35039 Hypertensive retinopathy, unspecified eye: Secondary | ICD-10-CM | POA: Diagnosis not present

## 2018-01-08 DIAGNOSIS — E039 Hypothyroidism, unspecified: Secondary | ICD-10-CM | POA: Diagnosis not present

## 2018-05-14 DIAGNOSIS — E039 Hypothyroidism, unspecified: Secondary | ICD-10-CM | POA: Diagnosis not present

## 2018-05-14 DIAGNOSIS — E1129 Type 2 diabetes mellitus with other diabetic kidney complication: Secondary | ICD-10-CM | POA: Diagnosis not present

## 2018-05-21 DIAGNOSIS — Z23 Encounter for immunization: Secondary | ICD-10-CM | POA: Diagnosis not present

## 2018-05-21 DIAGNOSIS — E1129 Type 2 diabetes mellitus with other diabetic kidney complication: Secondary | ICD-10-CM | POA: Diagnosis not present

## 2018-06-11 DIAGNOSIS — E119 Type 2 diabetes mellitus without complications: Secondary | ICD-10-CM | POA: Diagnosis not present

## 2019-03-17 ENCOUNTER — Other Ambulatory Visit: Payer: Self-pay

## 2019-05-24 DIAGNOSIS — Z23 Encounter for immunization: Secondary | ICD-10-CM | POA: Diagnosis not present

## 2019-05-24 DIAGNOSIS — E291 Testicular hypofunction: Secondary | ICD-10-CM | POA: Diagnosis not present

## 2019-05-24 DIAGNOSIS — E782 Mixed hyperlipidemia: Secondary | ICD-10-CM | POA: Diagnosis not present

## 2019-05-24 DIAGNOSIS — N189 Chronic kidney disease, unspecified: Secondary | ICD-10-CM | POA: Diagnosis not present

## 2019-05-24 DIAGNOSIS — I10 Essential (primary) hypertension: Secondary | ICD-10-CM | POA: Diagnosis not present

## 2019-05-24 DIAGNOSIS — E1129 Type 2 diabetes mellitus with other diabetic kidney complication: Secondary | ICD-10-CM | POA: Diagnosis not present

## 2019-05-24 DIAGNOSIS — E039 Hypothyroidism, unspecified: Secondary | ICD-10-CM | POA: Diagnosis not present

## 2019-06-08 DIAGNOSIS — E782 Mixed hyperlipidemia: Secondary | ICD-10-CM | POA: Diagnosis not present

## 2019-06-08 DIAGNOSIS — Z125 Encounter for screening for malignant neoplasm of prostate: Secondary | ICD-10-CM | POA: Diagnosis not present

## 2019-06-08 DIAGNOSIS — E039 Hypothyroidism, unspecified: Secondary | ICD-10-CM | POA: Diagnosis not present

## 2019-06-08 DIAGNOSIS — I10 Essential (primary) hypertension: Secondary | ICD-10-CM | POA: Diagnosis not present

## 2019-06-08 DIAGNOSIS — E1129 Type 2 diabetes mellitus with other diabetic kidney complication: Secondary | ICD-10-CM | POA: Diagnosis not present

## 2019-06-11 DIAGNOSIS — R292 Abnormal reflex: Secondary | ICD-10-CM | POA: Diagnosis not present

## 2019-06-11 DIAGNOSIS — D72829 Elevated white blood cell count, unspecified: Secondary | ICD-10-CM | POA: Diagnosis not present

## 2019-06-11 DIAGNOSIS — H35039 Hypertensive retinopathy, unspecified eye: Secondary | ICD-10-CM | POA: Diagnosis not present

## 2019-06-11 DIAGNOSIS — I10 Essential (primary) hypertension: Secondary | ICD-10-CM | POA: Diagnosis not present

## 2019-06-11 DIAGNOSIS — F331 Major depressive disorder, recurrent, moderate: Secondary | ICD-10-CM | POA: Diagnosis not present

## 2019-06-11 DIAGNOSIS — E1129 Type 2 diabetes mellitus with other diabetic kidney complication: Secondary | ICD-10-CM | POA: Diagnosis not present

## 2019-06-11 DIAGNOSIS — D649 Anemia, unspecified: Secondary | ICD-10-CM | POA: Diagnosis not present

## 2019-06-11 DIAGNOSIS — E782 Mixed hyperlipidemia: Secondary | ICD-10-CM | POA: Diagnosis not present

## 2019-06-11 DIAGNOSIS — J4540 Moderate persistent asthma, uncomplicated: Secondary | ICD-10-CM | POA: Diagnosis not present

## 2019-06-11 DIAGNOSIS — K219 Gastro-esophageal reflux disease without esophagitis: Secondary | ICD-10-CM | POA: Diagnosis not present

## 2019-06-11 DIAGNOSIS — N529 Male erectile dysfunction, unspecified: Secondary | ICD-10-CM | POA: Diagnosis not present

## 2019-07-14 DIAGNOSIS — E119 Type 2 diabetes mellitus without complications: Secondary | ICD-10-CM | POA: Diagnosis not present

## 2019-08-24 DIAGNOSIS — H18413 Arcus senilis, bilateral: Secondary | ICD-10-CM | POA: Diagnosis not present

## 2019-08-24 DIAGNOSIS — H35372 Puckering of macula, left eye: Secondary | ICD-10-CM | POA: Diagnosis not present

## 2019-08-24 DIAGNOSIS — H25043 Posterior subcapsular polar age-related cataract, bilateral: Secondary | ICD-10-CM | POA: Diagnosis not present

## 2019-08-24 DIAGNOSIS — H2512 Age-related nuclear cataract, left eye: Secondary | ICD-10-CM | POA: Diagnosis not present

## 2019-08-24 DIAGNOSIS — H25013 Cortical age-related cataract, bilateral: Secondary | ICD-10-CM | POA: Diagnosis not present

## 2019-08-24 DIAGNOSIS — H2513 Age-related nuclear cataract, bilateral: Secondary | ICD-10-CM | POA: Diagnosis not present

## 2019-09-19 ENCOUNTER — Inpatient Hospital Stay (HOSPITAL_COMMUNITY)
Admission: EM | Admit: 2019-09-19 | Discharge: 2019-09-24 | DRG: 308 | Disposition: A | Discharge status: Home / Self Care | Payer: Medicare Other | Attending: Internal Medicine | Admitting: Internal Medicine

## 2019-09-19 ENCOUNTER — Emergency Department (HOSPITAL_COMMUNITY): Payer: Medicare Other

## 2019-09-19 ENCOUNTER — Encounter (HOSPITAL_COMMUNITY): Payer: Self-pay | Admitting: Emergency Medicine

## 2019-09-19 ENCOUNTER — Other Ambulatory Visit: Payer: Self-pay

## 2019-09-19 DIAGNOSIS — N179 Acute kidney failure, unspecified: Secondary | ICD-10-CM | POA: Diagnosis not present

## 2019-09-19 DIAGNOSIS — J309 Allergic rhinitis, unspecified: Secondary | ICD-10-CM | POA: Diagnosis present

## 2019-09-19 DIAGNOSIS — R0602 Shortness of breath: Secondary | ICD-10-CM | POA: Diagnosis not present

## 2019-09-19 DIAGNOSIS — J189 Pneumonia, unspecified organism: Secondary | ICD-10-CM | POA: Diagnosis not present

## 2019-09-19 DIAGNOSIS — Q211 Atrial septal defect: Secondary | ICD-10-CM

## 2019-09-19 DIAGNOSIS — Z800 Family history of malignant neoplasm of digestive organs: Secondary | ICD-10-CM

## 2019-09-19 DIAGNOSIS — J45909 Unspecified asthma, uncomplicated: Secondary | ICD-10-CM | POA: Diagnosis present

## 2019-09-19 DIAGNOSIS — I129 Hypertensive chronic kidney disease with stage 1 through stage 4 chronic kidney disease, or unspecified chronic kidney disease: Secondary | ICD-10-CM | POA: Diagnosis present

## 2019-09-19 DIAGNOSIS — E11649 Type 2 diabetes mellitus with hypoglycemia without coma: Secondary | ICD-10-CM | POA: Diagnosis present

## 2019-09-19 DIAGNOSIS — R600 Localized edema: Secondary | ICD-10-CM | POA: Diagnosis present

## 2019-09-19 DIAGNOSIS — Z79899 Other long term (current) drug therapy: Secondary | ICD-10-CM

## 2019-09-19 DIAGNOSIS — N1831 Chronic kidney disease, stage 3a: Secondary | ICD-10-CM | POA: Diagnosis present

## 2019-09-19 DIAGNOSIS — I4891 Unspecified atrial fibrillation: Secondary | ICD-10-CM | POA: Diagnosis not present

## 2019-09-19 DIAGNOSIS — E8770 Fluid overload, unspecified: Secondary | ICD-10-CM | POA: Diagnosis present

## 2019-09-19 DIAGNOSIS — R079 Chest pain, unspecified: Secondary | ICD-10-CM | POA: Diagnosis not present

## 2019-09-19 DIAGNOSIS — F329 Major depressive disorder, single episode, unspecified: Secondary | ICD-10-CM | POA: Diagnosis present

## 2019-09-19 DIAGNOSIS — Z8249 Family history of ischemic heart disease and other diseases of the circulatory system: Secondary | ICD-10-CM

## 2019-09-19 DIAGNOSIS — Z20822 Contact with and (suspected) exposure to covid-19: Secondary | ICD-10-CM | POA: Diagnosis present

## 2019-09-19 DIAGNOSIS — A419 Sepsis, unspecified organism: Secondary | ICD-10-CM | POA: Diagnosis not present

## 2019-09-19 DIAGNOSIS — I480 Paroxysmal atrial fibrillation: Secondary | ICD-10-CM | POA: Diagnosis not present

## 2019-09-19 DIAGNOSIS — Z794 Long term (current) use of insulin: Secondary | ICD-10-CM

## 2019-09-19 DIAGNOSIS — I959 Hypotension, unspecified: Secondary | ICD-10-CM | POA: Diagnosis not present

## 2019-09-19 DIAGNOSIS — E669 Obesity, unspecified: Secondary | ICD-10-CM | POA: Diagnosis present

## 2019-09-19 DIAGNOSIS — Z6837 Body mass index (BMI) 37.0-37.9, adult: Secondary | ICD-10-CM

## 2019-09-19 DIAGNOSIS — T502X5A Adverse effect of carbonic-anhydrase inhibitors, benzothiadiazides and other diuretics, initial encounter: Secondary | ICD-10-CM | POA: Diagnosis present

## 2019-09-19 DIAGNOSIS — E1122 Type 2 diabetes mellitus with diabetic chronic kidney disease: Secondary | ICD-10-CM | POA: Diagnosis present

## 2019-09-19 DIAGNOSIS — Z7982 Long term (current) use of aspirin: Secondary | ICD-10-CM

## 2019-09-19 DIAGNOSIS — E785 Hyperlipidemia, unspecified: Secondary | ICD-10-CM | POA: Diagnosis present

## 2019-09-19 DIAGNOSIS — R0789 Other chest pain: Secondary | ICD-10-CM | POA: Diagnosis not present

## 2019-09-19 DIAGNOSIS — I10 Essential (primary) hypertension: Secondary | ICD-10-CM | POA: Diagnosis present

## 2019-09-19 DIAGNOSIS — I499 Cardiac arrhythmia, unspecified: Secondary | ICD-10-CM | POA: Diagnosis not present

## 2019-09-19 DIAGNOSIS — E876 Hypokalemia: Secondary | ICD-10-CM | POA: Diagnosis not present

## 2019-09-19 MED ORDER — DILTIAZEM LOAD VIA INFUSION
10.0000 mg | Freq: Once | INTRAVENOUS | Status: AC
  Administered 2019-09-20: 10 mg via INTRAVENOUS
  Filled 2019-09-19: qty 10

## 2019-09-19 MED ORDER — DILTIAZEM HCL-DEXTROSE 125-5 MG/125ML-% IV SOLN (PREMIX)
5.0000 mg/h | INTRAVENOUS | Status: DC
  Administered 2019-09-20: 5 mg/h via INTRAVENOUS
  Filled 2019-09-19: qty 125

## 2019-09-19 MED ORDER — ENOXAPARIN SODIUM 150 MG/ML ~~LOC~~ SOLN
1.0000 mg/kg | Freq: Once | SUBCUTANEOUS | Status: AC
  Administered 2019-09-20: 135 mg via SUBCUTANEOUS
  Filled 2019-09-19: qty 0.9

## 2019-09-19 MED ORDER — SODIUM CHLORIDE 0.9% FLUSH: 3.0000 mL | Freq: Once | INTRAVENOUS | Status: DC

## 2019-09-19 NOTE — ED Triage Notes (Signed)
Pt BIB [facility] from home, c/o shortness of breath that has been worsening x 3 weeks. ~6 hours prior to EMS arrival pt developed central chest pain that radiated to the left jaw and shoulder. EMS found pt in afib rvr with rates 150-180. Given 2- 10mg  cardizem IV boluses by EMS, report rate decreased to 120.

## 2019-09-20 ENCOUNTER — Encounter (HOSPITAL_COMMUNITY): Payer: Self-pay | Admitting: Emergency Medicine

## 2019-09-20 ENCOUNTER — Inpatient Hospital Stay (HOSPITAL_COMMUNITY): Payer: Medicare Other

## 2019-09-20 DIAGNOSIS — I480 Paroxysmal atrial fibrillation: Secondary | ICD-10-CM | POA: Diagnosis not present

## 2019-09-20 DIAGNOSIS — J189 Pneumonia, unspecified organism: Secondary | ICD-10-CM | POA: Diagnosis not present

## 2019-09-20 DIAGNOSIS — I129 Hypertensive chronic kidney disease with stage 1 through stage 4 chronic kidney disease, or unspecified chronic kidney disease: Secondary | ICD-10-CM | POA: Diagnosis not present

## 2019-09-20 DIAGNOSIS — R600 Localized edema: Secondary | ICD-10-CM | POA: Diagnosis not present

## 2019-09-20 DIAGNOSIS — Z79899 Other long term (current) drug therapy: Secondary | ICD-10-CM | POA: Diagnosis not present

## 2019-09-20 DIAGNOSIS — E785 Hyperlipidemia, unspecified: Secondary | ICD-10-CM | POA: Diagnosis not present

## 2019-09-20 DIAGNOSIS — T502X5A Adverse effect of carbonic-anhydrase inhibitors, benzothiadiazides and other diuretics, initial encounter: Secondary | ICD-10-CM | POA: Diagnosis present

## 2019-09-20 DIAGNOSIS — Z6837 Body mass index (BMI) 37.0-37.9, adult: Secondary | ICD-10-CM | POA: Diagnosis not present

## 2019-09-20 DIAGNOSIS — J309 Allergic rhinitis, unspecified: Secondary | ICD-10-CM | POA: Diagnosis present

## 2019-09-20 DIAGNOSIS — A419 Sepsis, unspecified organism: Secondary | ICD-10-CM | POA: Diagnosis not present

## 2019-09-20 DIAGNOSIS — I4891 Unspecified atrial fibrillation: Secondary | ICD-10-CM

## 2019-09-20 DIAGNOSIS — E8770 Fluid overload, unspecified: Secondary | ICD-10-CM | POA: Diagnosis present

## 2019-09-20 DIAGNOSIS — Q211 Atrial septal defect: Secondary | ICD-10-CM | POA: Diagnosis not present

## 2019-09-20 DIAGNOSIS — N289 Disorder of kidney and ureter, unspecified: Secondary | ICD-10-CM | POA: Diagnosis not present

## 2019-09-20 DIAGNOSIS — E1122 Type 2 diabetes mellitus with diabetic chronic kidney disease: Secondary | ICD-10-CM | POA: Diagnosis not present

## 2019-09-20 DIAGNOSIS — I351 Nonrheumatic aortic (valve) insufficiency: Secondary | ICD-10-CM | POA: Diagnosis not present

## 2019-09-20 DIAGNOSIS — E11649 Type 2 diabetes mellitus with hypoglycemia without coma: Secondary | ICD-10-CM | POA: Diagnosis not present

## 2019-09-20 DIAGNOSIS — N179 Acute kidney failure, unspecified: Secondary | ICD-10-CM | POA: Diagnosis not present

## 2019-09-20 DIAGNOSIS — I10 Essential (primary) hypertension: Secondary | ICD-10-CM | POA: Diagnosis not present

## 2019-09-20 DIAGNOSIS — Z794 Long term (current) use of insulin: Secondary | ICD-10-CM | POA: Diagnosis not present

## 2019-09-20 DIAGNOSIS — Z8249 Family history of ischemic heart disease and other diseases of the circulatory system: Secondary | ICD-10-CM | POA: Diagnosis not present

## 2019-09-20 DIAGNOSIS — R0602 Shortness of breath: Secondary | ICD-10-CM | POA: Diagnosis not present

## 2019-09-20 DIAGNOSIS — I340 Nonrheumatic mitral (valve) insufficiency: Secondary | ICD-10-CM | POA: Diagnosis not present

## 2019-09-20 DIAGNOSIS — Z800 Family history of malignant neoplasm of digestive organs: Secondary | ICD-10-CM | POA: Diagnosis not present

## 2019-09-20 DIAGNOSIS — Z20822 Contact with and (suspected) exposure to covid-19: Secondary | ICD-10-CM | POA: Diagnosis not present

## 2019-09-20 DIAGNOSIS — I350 Nonrheumatic aortic (valve) stenosis: Secondary | ICD-10-CM | POA: Diagnosis not present

## 2019-09-20 DIAGNOSIS — E876 Hypokalemia: Secondary | ICD-10-CM | POA: Diagnosis not present

## 2019-09-20 DIAGNOSIS — I959 Hypotension, unspecified: Secondary | ICD-10-CM | POA: Diagnosis not present

## 2019-09-20 DIAGNOSIS — Z7982 Long term (current) use of aspirin: Secondary | ICD-10-CM | POA: Diagnosis not present

## 2019-09-20 DIAGNOSIS — J45909 Unspecified asthma, uncomplicated: Secondary | ICD-10-CM | POA: Diagnosis present

## 2019-09-20 DIAGNOSIS — N183 Chronic kidney disease, stage 3 unspecified: Secondary | ICD-10-CM | POA: Diagnosis not present

## 2019-09-20 DIAGNOSIS — E1169 Type 2 diabetes mellitus with other specified complication: Secondary | ICD-10-CM | POA: Diagnosis not present

## 2019-09-20 DIAGNOSIS — E669 Obesity, unspecified: Secondary | ICD-10-CM | POA: Diagnosis not present

## 2019-09-20 DIAGNOSIS — J4540 Moderate persistent asthma, uncomplicated: Secondary | ICD-10-CM | POA: Diagnosis not present

## 2019-09-20 DIAGNOSIS — F329 Major depressive disorder, single episode, unspecified: Secondary | ICD-10-CM | POA: Diagnosis present

## 2019-09-20 LAB — CBG MONITORING, ED
Glucose-Capillary: 214 mg/dL — ABNORMAL HIGH (ref 70–99)
Glucose-Capillary: 175 mg/dL — ABNORMAL HIGH (ref 70–99)
Glucose-Capillary: 155 mg/dL — ABNORMAL HIGH (ref 70–99)

## 2019-09-20 LAB — BASIC METABOLIC PANEL
Anion gap: 14 (ref 5–15)
BUN: 29 mg/dL — ABNORMAL HIGH (ref 8–23)
CO2: 26 mmol/L (ref 22–32)
Calcium: 9.8 mg/dL (ref 8.9–10.3)
Chloride: 102 mmol/L (ref 98–111)
Creatinine, Ser: 1.21 mg/dL (ref 0.61–1.24)
GFR calc Af Amer: >60 mL/min (ref >60)
GFR calc non Af Amer: 59 mL/min — ABNORMAL LOW (ref >60)
Glucose, Bld: 132 mg/dL — ABNORMAL HIGH (ref 70–99)
Potassium: 3.7 mmol/L (ref 3.5–5.1)
Sodium: 142 mmol/L (ref 135–145)

## 2019-09-20 LAB — HEMOGLOBIN A1C
Hgb A1c MFr Bld: 6.5 % — ABNORMAL HIGH (ref 4.8–5.6)
Mean Plasma Glucose: 139.85 mg/dL

## 2019-09-20 LAB — MAGNESIUM: Magnesium: 1.6 mg/dL — ABNORMAL LOW (ref 1.7–2.4)

## 2019-09-20 LAB — RESPIRATORY PANEL BY RT PCR (FLU A&B, COVID)
Influenza A by PCR: NEGATIVE
Influenza B by PCR: NEGATIVE
SARS Coronavirus 2 by RT PCR: NEGATIVE

## 2019-09-20 LAB — CBC
HCT: 38.8 % — ABNORMAL LOW (ref 39.0–52.0)
HCT: 41 % (ref 39.0–52.0)
Hemoglobin: 12.4 g/dL — ABNORMAL LOW (ref 13.0–17.0)
Hemoglobin: 12.9 g/dL — ABNORMAL LOW (ref 13.0–17.0)
MCH: 31.1 pg (ref 26.0–34.0)
MCH: 31.4 pg (ref 26.0–34.0)
MCHC: 31.5 g/dL (ref 30.0–36.0)
MCHC: 32 g/dL (ref 30.0–36.0)
MCV: 98.2 fL (ref 80.0–100.0)
MCV: 98.8 fL (ref 80.0–100.0)
Platelets: 204 10*3/uL (ref 150–400)
Platelets: 228 10*3/uL (ref 150–400)
RBC: 3.95 MIL/uL — ABNORMAL LOW (ref 4.22–5.81)
RBC: 4.15 MIL/uL — ABNORMAL LOW (ref 4.22–5.81)
RDW: 13.2 % (ref 11.5–15.5)
RDW: 13.3 % (ref 11.5–15.5)
WBC: 15.4 10*3/uL — ABNORMAL HIGH (ref 4.0–10.5)
WBC: 16.6 10*3/uL — ABNORMAL HIGH (ref 4.0–10.5)
nRBC: 0 % (ref 0.0–0.2)
nRBC: 0 % (ref 0.0–0.2)

## 2019-09-20 LAB — ECHOCARDIOGRAM COMPLETE
Height: 75 in
Weight: 4800 oz

## 2019-09-20 LAB — GLUCOSE, CAPILLARY: Glucose-Capillary: 158 mg/dL — ABNORMAL HIGH (ref 70–99)

## 2019-09-20 LAB — BRAIN NATRIURETIC PEPTIDE: B Natriuretic Peptide: 289.1 pg/mL — ABNORMAL HIGH (ref 0.0–100.0)

## 2019-09-20 LAB — D-DIMER, QUANTITATIVE: D-Dimer, Quant: 1.04 ug/mL-FEU — ABNORMAL HIGH (ref 0.00–0.50)

## 2019-09-20 LAB — TROPONIN I (HIGH SENSITIVITY)
Troponin I (High Sensitivity): 11 ng/L (ref <18)
Troponin I (High Sensitivity): 12 ng/L (ref <18)

## 2019-09-20 LAB — TSH: TSH: 1.103 u[IU]/mL (ref 0.350–4.500)

## 2019-09-20 MED ORDER — ADULT MULTIVITAMIN W/MINERALS CH
1.0000 | ORAL_TABLET | Freq: Every day | ORAL | Status: DC
  Administered 2019-09-20 – 2019-09-24 (×4): 1 via ORAL
  Filled 2019-09-20 (×4): qty 1

## 2019-09-20 MED ORDER — SERTRALINE HCL 100 MG PO TABS
100.0000 mg | ORAL_TABLET | Freq: Every day | ORAL | Status: DC
  Administered 2019-09-20 – 2019-09-24 (×4): 100 mg via ORAL
  Filled 2019-09-20 (×5): qty 1

## 2019-09-20 MED ORDER — PERFLUTREN LIPID MICROSPHERE
1.0000 mL | INTRAVENOUS | Status: AC | PRN
  Administered 2019-09-20: 2 mL via INTRAVENOUS
  Filled 2019-09-20: qty 10

## 2019-09-20 MED ORDER — POTASSIUM CHLORIDE CRYS ER 20 MEQ PO TBCR
40.0000 meq | EXTENDED_RELEASE_TABLET | Freq: Once | ORAL | Status: AC
  Administered 2019-09-20: 40 meq via ORAL
  Filled 2019-09-20: qty 2

## 2019-09-20 MED ORDER — SODIUM CHLORIDE 0.9 % IV SOLN
1.0000 g | Freq: Once | INTRAVENOUS | Status: AC
  Administered 2019-09-20: 1 g via INTRAVENOUS
  Filled 2019-09-20: qty 10

## 2019-09-20 MED ORDER — INSULIN ASPART 100 UNIT/ML ~~LOC~~ SOLN
0.0000 [IU] | Freq: Three times a day (TID) | SUBCUTANEOUS | Status: DC
  Administered 2019-09-20: 2 [IU] via SUBCUTANEOUS
  Administered 2019-09-20: 08:00:00 3 [IU] via SUBCUTANEOUS
  Administered 2019-09-20: 19:00:00 2 [IU] via SUBCUTANEOUS
  Administered 2019-09-21 – 2019-09-24 (×4): 1 [IU] via SUBCUTANEOUS

## 2019-09-20 MED ORDER — METOPROLOL TARTRATE 25 MG PO TABS
25.0000 mg | ORAL_TABLET | Freq: Four times a day (QID) | ORAL | Status: AC
  Administered 2019-09-20 – 2019-09-22 (×10): 25 mg via ORAL
  Filled 2019-09-20 (×10): qty 1

## 2019-09-20 MED ORDER — INSULIN ASPART PROT & ASPART (70-30 MIX) 100 UNIT/ML ~~LOC~~ SUSP
40.0000 [IU] | Freq: Two times a day (BID) | SUBCUTANEOUS | Status: DC
  Administered 2019-09-20 – 2019-09-21 (×4): 40 [IU] via SUBCUTANEOUS
  Filled 2019-09-20: qty 10

## 2019-09-20 MED ORDER — ACETAMINOPHEN 325 MG PO TABS: 650.0000 mg | ORAL_TABLET | Freq: Four times a day (QID) | ORAL | Status: DC | PRN

## 2019-09-20 MED ORDER — HEPARIN (PORCINE) 25000 UT/250ML-% IV SOLN: 1800.0000 [IU]/h | INTRAVENOUS | Status: DC

## 2019-09-20 MED ORDER — LORATADINE 10 MG PO TABS: 10.0000 mg | ORAL_TABLET | Freq: Every day | ORAL | Status: DC | PRN

## 2019-09-20 MED ORDER — ENOXAPARIN SODIUM 150 MG/ML ~~LOC~~ SOLN
135.0000 mg | Freq: Two times a day (BID) | SUBCUTANEOUS | Status: DC
  Filled 2019-09-20: qty 0.9

## 2019-09-20 MED ORDER — POTASSIUM CHLORIDE CRYS ER 20 MEQ PO TBCR: 40.0000 meq | EXTENDED_RELEASE_TABLET | Freq: Once | ORAL | Status: DC

## 2019-09-20 MED ORDER — SODIUM CHLORIDE 0.9 % IV SOLN
1.0000 g | Freq: Every day | INTRAVENOUS | Status: DC
  Administered 2019-09-21 – 2019-09-24 (×3): 1 g via INTRAVENOUS
  Filled 2019-09-20 (×3): qty 10

## 2019-09-20 MED ORDER — SODIUM CHLORIDE 0.9 % IV SOLN
500.0000 mg | Freq: Once | INTRAVENOUS | Status: AC
  Administered 2019-09-20: 500 mg via INTRAVENOUS
  Filled 2019-09-20: qty 500

## 2019-09-20 MED ORDER — ASPIRIN EC 81 MG PO TBEC
81.0000 mg | DELAYED_RELEASE_TABLET | Freq: Every day | ORAL | Status: DC
  Administered 2019-09-20: 10:00:00 81 mg via ORAL
  Filled 2019-09-20: qty 1

## 2019-09-20 MED ORDER — METOPROLOL TARTRATE 5 MG/5ML IV SOLN
2.5000 mg | INTRAVENOUS | Status: AC | PRN
  Administered 2019-09-20 (×2): 2.5 mg via INTRAVENOUS
  Filled 2019-09-20 (×2): qty 5

## 2019-09-20 MED ORDER — GLUCOSAMINE CHONDR 1500 COMPLX PO CAPS: 1.0000 | ORAL_CAPSULE | Freq: Two times a day (BID) | ORAL | Status: DC

## 2019-09-20 MED ORDER — PANTOPRAZOLE SODIUM 40 MG PO TBEC
40.0000 mg | DELAYED_RELEASE_TABLET | Freq: Every day | ORAL | Status: DC
  Administered 2019-09-20 – 2019-09-24 (×4): 40 mg via ORAL
  Filled 2019-09-20 (×4): qty 1

## 2019-09-20 MED ORDER — FLUTICASONE FUROATE-VILANTEROL 100-25 MCG/INH IN AEPB
1.0000 | INHALATION_SPRAY | Freq: Every day | RESPIRATORY_TRACT | Status: DC
  Administered 2019-09-21 – 2019-09-24 (×4): 1 via RESPIRATORY_TRACT
  Filled 2019-09-20: qty 28

## 2019-09-20 MED ORDER — ATORVASTATIN CALCIUM 40 MG PO TABS
40.0000 mg | ORAL_TABLET | Freq: Every day | ORAL | Status: DC
Start: 2019-09-20 — End: 2019-09-24
  Administered 2019-09-20 – 2019-09-24 (×4): 40 mg via ORAL
  Filled 2019-09-20 (×4): qty 1

## 2019-09-20 MED ORDER — SODIUM CHLORIDE 0.9 % IV SOLN
500.0000 mg | Freq: Every day | INTRAVENOUS | Status: DC
  Administered 2019-09-21 – 2019-09-23 (×3): 500 mg via INTRAVENOUS
  Filled 2019-09-20 (×4): qty 500

## 2019-09-20 MED ORDER — IRBESARTAN 75 MG PO TABS: 75.0000 mg | ORAL_TABLET | Freq: Every day | ORAL | Status: DC

## 2019-09-20 MED ORDER — ACETAMINOPHEN 650 MG RE SUPP: 650.0000 mg | Freq: Four times a day (QID) | RECTAL | Status: DC | PRN

## 2019-09-20 MED ORDER — APIXABAN 5 MG PO TABS
5.0000 mg | ORAL_TABLET | Freq: Two times a day (BID) | ORAL | Status: DC
  Administered 2019-09-20 – 2019-09-24 (×9): 5 mg via ORAL
  Filled 2019-09-20 (×11): qty 1

## 2019-09-20 MED ORDER — MAGNESIUM SULFATE 2 GM/50ML IV SOLN
2.0000 g | Freq: Once | INTRAVENOUS | Status: AC
  Administered 2019-09-20: 2 g via INTRAVENOUS
  Filled 2019-09-20: qty 50

## 2019-09-20 MED ORDER — ALBUTEROL SULFATE (2.5 MG/3ML) 0.083% IN NEBU: 2.5000 mg | INHALATION_SOLUTION | RESPIRATORY_TRACT | Status: DC | PRN

## 2019-09-20 NOTE — ED Notes (Signed)
Pt's BNP added onto previously drawn lab work via Electrical engineer

## 2019-09-20 NOTE — ED Notes (Signed)
Pt placed on 2L [place] for comfort.

## 2019-09-20 NOTE — H&P (Addendum)
History and Physical    Brandon Sanchez IRS:[id number] DOB: 11/11/1945 DOA: 09/19/2019  PCP: Deland Pretty, MD Patient coming from: Home  Chief Complaint: Shortness of breath, chest pain  HPI: Brandon Sanchez is a 74 y.o. male with medical history significant of allergic rhinitis, asthma, insulin-dependent type 2 diabetes, hypertension, hyperlipidemia presenting to the ED with complaints of shortness of breath and chest pain.  EMS found patient to be in A. fib with RVR with rate 150-180.  He was given IV Cardizem bolus by EMS.  Patient states he has chronic shortness of breath due to his asthma but for the past 1 month it has been much worse.  No cough or wheezing.  No fevers.  Last night he was very short of breath and experienced substernal chest pain which radiated to his jaw and left arm.  He has not experienced any heart palpitations.  Denies prior history of A. fib.  Does state that several years ago he was told that he had a problem with his heart valve.  He has previously been seen by a cardiologist.  No other complaints.  Denies nausea, vomiting, abdominal pain, diarrhea, dysuria.  ED Course: A. fib with RVR with rate up to 150s in the ED.  Afebrile.  WBC count 15.4.  Initial high-sensitivity troponin negative, repeat pending.  SARS-CoV-2 PCR test negative.  Influenza panel negative.  Blood culture x2 pending.  Chest x-ray showing mild bibasilar infiltrates, right greater than left. Patient received Cardizem bolus and was started on infusion.  Received Lovenox 1 mg/kg for anticoagulation.  Received ceftriaxone and azithromycin for pneumonia.  Review of Systems:  All systems reviewed and apart from history of presenting illness, are negative.  Past Medical History:  Diagnosis Date  . Allergic rhinitis, cause unspecified   . Asthma   . Other and unspecified hyperlipidemia   . Type II or unspecified type diabetes mellitus without mention of complication, not stated as uncontrolled   .  Unspecified essential hypertension     History reviewed. No pertinent surgical history.   reports that he has never smoked. He has never used smokeless tobacco. He reports current alcohol use of about 3.0 standard drinks of alcohol per week. No history on file for drug.  No Known Allergies  Family History  Problem Relation Age of Onset  . Colon cancer Father   . Heart disease Paternal Grandfather     Prior to Admission medications   Medication Sig Start Date End Date Taking? Authorizing Provider  acetaminophen (TYLENOL) 500 MG tablet Take 500 mg by mouth See admin instructions. 1500mg  in the morning before lunch, then 1500mg  at bedtime   Yes [provider]  albuterol (PROAIR HFA) 108 (90 BASE) MCG/ACT inhaler 2 puffs every 4 hours as needed only  if your can't catch your breath 05/12/15  Yes Tanda Rockers, MD  aspirin 81 MG tablet Take 81 mg by mouth daily.     Yes [provider]  atorvastatin (LIPITOR) 40 MG tablet Take 40 mg by mouth daily.     Yes [provider]  cetirizine-pseudoephedrine (ZYRTEC-D) 5-120 MG per tablet Take 1 tablet by mouth daily as needed for allergies.    Yes [provider]  Fluticasone-Salmeterol (ADVAIR) 100-50 MCG/DOSE AEPB Inhale 1 puff into the lungs daily.   Yes [provider]  furosemide (LASIX) 40 MG tablet 40 mg 3 (three) times daily. 2 tablets in AM and 1 tablet midday.   Yes [provider]  Glucosamine-Chondroit-Vit C-Mn (GLUCOSAMINE CHONDR 1500 COMPLX) CAPS Take 1 capsule by mouth 2 (two) times daily.   Yes [provider]  insulin aspart protamine- aspart (NOVOLOG MIX 70/30) (70-30) 100 UNIT/ML injection Inject 40 Units into the skin 2 (two) times daily at 8 am and 10 pm.   Yes [provider]  Multiple Vitamin (MULTIVITAMIN WITH MINERALS) TABS tablet Take 1 tablet by mouth daily.   Yes [provider]  omeprazole (PRILOSEC) 40 MG capsule Take 40 mg by mouth daily as  needed (heart burn).    Yes [provider]  sertraline (ZOLOFT) 100 MG tablet Take 100 mg by mouth daily.   Yes [provider]  valsartan (DIOVAN) 80 MG tablet Take 80 mg by mouth daily.   Yes [provider]    Physical Exam: Vitals:   09/20/19 0250 09/20/19 0255 09/20/19 0300 09/20/19 0305  BP: (!) 93/52 93/71 93/76  (!) 103/55  Pulse: 71 60 (!) 40   Resp: 16 (!) 21 20 (!) 21  Temp:      TempSrc:      SpO2: 94% 97% 97%   Weight:      Height:        Physical Exam  Constitutional: He is oriented to person, place, and time. He appears well-developed and well-nourished. No distress.  HENT:  Head: Normocephalic.  Eyes: Right eye exhibits no discharge. Left eye exhibits no discharge.  Cardiovascular: Intact distal pulses.  Rate up to 150s Irregularly irregular rhythm  Pulmonary/Chest: Effort normal and breath sounds normal. No respiratory distress. He has no wheezes. He has no rales.  Abdominal: Soft. Bowel sounds are normal. He exhibits no distension. There is no abdominal tenderness. There is no guarding.  Musculoskeletal:        General: Edema present.     Cervical back: Neck supple.     Comments: +3 pitting edema bilateral lower extremities  Neurological: He is alert and oriented to person, place, and time.  Skin: Skin is warm and dry. He is not diaphoretic.     Labs on Admission: I have personally reviewed following labs and imaging studies  CBC: Recent Labs  Lab 09/19/19 2342  WBC 15.4*  HGB 12.9*  HCT 41.0  MCV 98.8  PLT 228   Basic Metabolic Panel: Recent Labs  Lab 09/19/19 2342  NA 142  K 3.7  CL 102  CO2 26  GLUCOSE 132*  BUN 29*  CREATININE 1.21  CALCIUM 9.8   GFR: Estimated Creatinine Clearance: 82 mL/min (by C-G formula based on SCr of 1.21 mg/dL). Liver Function Tests: No results for input(s): AST, ALT, ALKPHOS, BILITOT, PROT, ALBUMIN in the last 168 hours. No results for input(s): LIPASE, AMYLASE in the last 168  hours. No results for input(s): AMMONIA in the last 168 hours. Coagulation Profile: No results for input(s): INR, PROTIME in the last 168 hours. Cardiac Enzymes: No results for input(s): CKTOTAL, CKMB, CKMBINDEX, TROPONINI in the last 168 hours. BNP (last 3 results) No results for input(s): PROBNP in the last 8760 hours. HbA1C: No results for input(s): HGBA1C in the last 72 hours. CBG: No results for input(s): GLUCAP in the last 168 hours. Lipid Profile: No results for input(s): CHOL, HDL, LDLCALC, TRIG, CHOLHDL, LDLDIRECT in the last 72 hours. Thyroid Function Tests: No results for input(s): TSH, T4TOTAL, FREET4, T3FREE, THYROIDAB in the last 72 hours. Anemia Panel: No results for input(s): VITAMINB12, FOLATE, FERRITIN, TIBC, IRON, RETICCTPCT in the last 72 hours. Urine analysis: No results  found for: COLORURINE, APPEARANCEUR, LABSPEC, PHURINE, GLUCOSEU, HGBUR, BILIRUBINUR, KETONESUR, PROTEINUR, UROBILINOGEN, NITRITE, LEUKOCYTESUR  Radiological Exams on Admission: DG Chest Port 1 View  Result Date: 09/20/2019 CLINICAL DATA:  Shortness of breath for 3 weeks EXAM: PORTABLE CHEST 1 VIEW COMPARISON:  07/08/2016 FINDINGS: Cardiac shadow is mildly prominent but accentuated by the portable technique. Bibasilar opacities are noted right greater than left likely representing early infiltrate. No sizable effusion is seen. No bony abnormality is noted. IMPRESSION: Mild bibasilar infiltrates right greater than left. Electronically Signed   By: Alcide Clever M.D.   On: 09/20/2019 00:01    EKG: Independently reviewed.  A. fib with RVR, heart rate 147.  PVCs.  Rate increased since prior tracing.  Assessment/Plan Principal Problem:   Atrial fibrillation with rapid ventricular response (HCC) Active Problems:   Diabetes mellitus (HCC)   HLD (hyperlipidemia)   HTN (hypertension)   CAP (community acquired pneumonia)   New onset A. fib with RVR Patient reports 1 month history of worsening dyspnea.   Does state that several years ago he was told that he had a heart valve problem.  Found to be in A. fib with RVR with rate 150-180 by EMS.  Received Cardizem boluses and started on infusion.  Rate currently between 130-150 and unable to titrate up dose of Cardizem as blood pressure is low, most recent systolic 90. -Cardiac monitoring -Continue Cardizem infusion -Not a good candidate for cardioversion as he has been symptomatic for a month and is not on anticoagulation. -Appears volume overloaded on exam.  Check BNP level, if normal, may be able to give a small fluid bolus to improve blood pressure. -CHA2DS2VASc at least 3.  Received Lovenox in the ED for anticoagulation, continue. -Check TSH level -Echocardiogram -Cardiology has been paged, awaiting callback  Addendum: Cardiology recommending switching from Cardizem to p.o. metoprolol 25 mg every 6 hours.  Will consult.  Community-acquired pneumonia Not hypoxic.  Labs showing leukocytosis.  Chest x-ray with mild bibasilar infiltrates, right greater than left.  SARS-CoV-2 PCR test negative.  Influenza panel negative. -Continue ceftriaxone and azithromycin -Continuous pulse ox, supplemental oxygen as needed to keep oxygen saturation above 92% -Blood culture x2 pending -Continue to monitor WBC count  Chest pain Suspect related to A. fib with RVR.  No documented history of CAD.  High-sensitivity troponin negative. -Cardiac monitoring -Trend troponin -Echocardiogram as above  Asthma -Stable.  No bronchospasm. Continue home inhalers.  Insulin-dependent type 2 diabetes -Check A1c -Continue home NovoLog 70/30 - 40 units twice daily -Sliding scale insulin sensitive with meals  Hypertension -Cardizem infusion as above -Monitor blood pressure closely  Hyperlipidemia -Continue home Lipitor  DVT prophylaxis: Eliquis Code Status: Patient wishes to be full code. Family Communication: No family available at this time. Disposition Plan:  Anticipate discharge after clinical improvement. Consults called: None Admission status: It is my clinical opinion that admission to INPATIENT is reasonable and necessary in this 74 y.o. male . presenting with new onset A. fib with RVR and community-acquired pneumonia.  High risk of decompensation.  Given the aforementioned, the predictability of an adverse outcome is felt to be significant. I expect that the patient will require at least 2 midnights in the hospital to treat this condition.   The medical decision making on this patient was of high complexity and the patient is at high risk for clinical deterioration, therefore this is a level 3 visit.  John Giovanni MD [facility]  If 7PM-7AM, please contact night-coverage [email or web address] Password [facility]  09/20/2019,  3:31 AM

## 2019-09-20 NOTE — ED Notes (Signed)
Per MD Polumbo verbal order, this RN will bolus 5mg  of cardizem & increase rate by 5mg /hr to 12.5mg /hr. Per MD, this RN to keep systolic pressure >90

## 2019-09-20 NOTE — Progress Notes (Signed)
  Echocardiogram 2D Echocardiogram has been performed.  Nuriya Stuck A Ahtziry Saathoff 09/20/2019, 3:26 PM

## 2019-09-20 NOTE — Progress Notes (Addendum)
ANTICOAGULATION CONSULT NOTE - Initial Consult  Pharmacy Consult for Heparin Indication: atrial fibrillation  No Known Allergies  Patient Measurements: Height: 6\' 3"  (190.5 cm) Weight: 300 lb (136.1 kg) IBW/kg (Calculated) : 84.5  Vital Signs: Temp: 98.2 F (36.8 C) (01/31 2339) Temp Source: Tympanic (01/31 2339) BP: 103/55 (02/01 0305) Pulse Rate: 40 (02/01 0300)  Labs: Recent Labs    09/19/19 2342 09/20/19 0217  HGB 12.9*  --   HCT 41.0  --   PLT 228  --   CREATININE 1.21  --   TROPONINIHS 11 12    Estimated Creatinine Clearance: 82 mL/min (by C-G formula based on SCr of 1.21 mg/dL).   Medical History: Past Medical History:  Diagnosis Date  . Allergic rhinitis, cause unspecified   . Asthma   . Other and unspecified hyperlipidemia   . Type II or unspecified type diabetes mellitus without mention of complication, not stated as uncontrolled   . Unspecified essential hypertension     Medications:  No current facility-administered medications on file prior to encounter.   Current Outpatient Medications on File Prior to Encounter  Medication Sig Dispense Refill  . acetaminophen (TYLENOL) 500 MG tablet Take 500 mg by mouth See admin instructions. 1500mg  in the morning before lunch, then 1500mg  at bedtime    . albuterol (PROAIR HFA) 108 (90 BASE) MCG/ACT inhaler 2 puffs every 4 hours as needed only  if your can't catch your breath 1 Inhaler 1  . aspirin 81 MG tablet Take 81 mg by mouth daily.      11/18/19 atorvastatin (LIPITOR) 40 MG tablet Take 40 mg by mouth daily.      . cetirizine-pseudoephedrine (ZYRTEC-D) 5-120 MG per tablet Take 1 tablet by mouth daily as needed for allergies.     . Fluticasone-Salmeterol (ADVAIR) 100-50 MCG/DOSE AEPB Inhale 1 puff into the lungs daily.    . furosemide (LASIX) 40 MG tablet 40 mg 3 (three) times daily. 2 tablets in AM and 1 tablet midday.    . Glucosamine-Chondroit-Vit C-Mn (GLUCOSAMINE CHONDR 1500 COMPLX) CAPS Take 1 capsule by  mouth 2 (two) times daily.    . insulin aspart protamine- aspart (NOVOLOG MIX 70/30) (70-30) 100 UNIT/ML injection Inject 40 Units into the skin 2 (two) times daily at 8 am and 10 pm.    . Multiple Vitamin (MULTIVITAMIN WITH MINERALS) TABS tablet Take 1 tablet by mouth daily.    omeprazole (PRILOSEC) 40 MG capsule Take 40 mg by mouth daily as needed (heart burn).     . sertraline (ZOLOFT) 100 MG tablet Take 100 mg by mouth daily.    . valsartan (DIOVAN) 80 MG tablet Take 80 mg by mouth daily.    . [DISCONTINUED] ADVAIR DISKUS 250-50 MCG/DOSE AEPB inhale 1 dose by mouth twice a day 60 each 0     Assessment: 74 y.o. male with new onset Afib for heparin Goal of Therapy:  Full anticoagulation with Lovenox Monitor platelets by anticoagulation protocol: Yes   Plan:  Lovenox 135 mg SQ q12h  Marland Kitchen 09/20/2019,3:31 AM  Addendum Switching to heparin.  Will start heparin 1800 units/hr at noon today, 12 hours after dose of Lovenox.  Check heparin level in 8 hours.   61, PharmD, BCPS  09/20/2019 5:16 AM

## 2019-09-20 NOTE — Consult Note (Signed)
Cardiology Consultation:   Patient ID: Brandon Sanchez MRN: [id number]; DOB: [birth year]  Admit date: 09/19/2019 Date of Consult: 09/20/2019  Primary Care Provider: Merri Brunette, MD Primary Cardiologist: No primary care provider on file.  Primary Electrophysiologist:  None    Patient Profile:   Brandon Sanchez is a 74 y.o. male with a hx of asthma, DM2, dyslipidemia, HTN who is being seen today for the evaluation of afib at the request of Dr. Loney Loh.  History of Present Illness:   Mr. Lasch presented to the ED earlier this evening due to 3 wk h/o worsening SOB/DOE and a few hours of pleuritic CP that started yesterday evening around 5pm. Pt says he is always SOB due to his asthma, but that his DOE has been particularly worse over the past 3 weeks, doing less making him more SOB. No associated chest discomfort until last night, when he developed constant sharp CP across his chest, radiating into his neck and arm, worse w/ deep breathing. He denies palpitations, presyncope, syncope. He has had some worsening LE edema. He was found by EMS to be in afib w/ RVR, a new diagnosis for him. He is not on any anti-coagulation at this time. He was also found on CXR to have consolidation c/w PNA. We have been called by hospitalist as pt's HR is still elevated on cardizem gtt started by the ED, although BP is dropping.  Heart Pathway Score:     Past Medical History:  Diagnosis Date  . Allergic rhinitis, cause unspecified   . Asthma   . Other and unspecified hyperlipidemia   . Type II or unspecified type diabetes mellitus without mention of complication, not stated as uncontrolled   . Unspecified essential hypertension     History reviewed. No pertinent surgical history.   Home Medications:  Prior to Admission medications   Medication Sig Start Date End Date Taking? Authorizing Provider  acetaminophen (TYLENOL) 500 MG tablet Take 500 mg by mouth See admin instructions. 1500mg  in the morning  before lunch, then 1500mg  at bedtime   Yes [provider]  albuterol (PROAIR HFA) 108 (90 BASE) MCG/ACT inhaler 2 puffs every 4 hours as needed only  if your can't catch your breath 05/12/15  Yes , MD  aspirin 81 MG tablet Take 81 mg by mouth daily.     Yes [provider]  atorvastatin (LIPITOR) 40 MG tablet Take 40 mg by mouth daily.     Yes [provider]  cetirizine-pseudoephedrine (ZYRTEC-D) 5-120 MG per tablet Take 1 tablet by mouth daily as needed for allergies.    Yes [provider]  Fluticasone-Salmeterol (ADVAIR) 100-50 MCG/DOSE AEPB Inhale 1 puff into the lungs daily.   Yes [provider]  furosemide (LASIX) 40 MG tablet 40 mg 3 (three) times daily. 2 tablets in AM and 1 tablet midday.   Yes [provider]  Glucosamine-Chondroit-Vit C-Mn (GLUCOSAMINE CHONDR 1500 COMPLX) CAPS Take 1 capsule by mouth 2 (two) times daily.   Yes [provider]  insulin aspart protamine- aspart (NOVOLOG MIX 70/30) (70-30) 100 UNIT/ML injection Inject 40 Units into the skin 2 (two) times daily at 8 am and 10 pm.   Yes [provider]  Multiple Vitamin (MULTIVITAMIN WITH MINERALS) TABS tablet Take 1 tablet by mouth daily.   Yes [provider]  omeprazole (PRILOSEC) 40 MG capsule Take 40 mg by mouth daily as needed (heart burn).    Yes [provider]  sertraline (ZOLOFT)  100 MG tablet Take 100 mg by mouth daily.   Yes [provider]  valsartan (DIOVAN) 80 MG tablet Take 80 mg by mouth daily.   Yes [provider]    Inpatient Medications: Scheduled Meds: . aspirin EC  81 mg Oral Daily  . atorvastatin  40 mg Oral Daily  . enoxaparin (LOVENOX) injection  135 mg Subcutaneous BID  . fluticasone furoate-vilanterol  1 puff Inhalation Daily  . insulin aspart  0-9 Units Subcutaneous TID WC  . insulin aspart protamine- aspart  40 Units Subcutaneous BID AC & HS  . metoprolol tartrate  25  mg Oral Q6H  . multivitamin with minerals  1 tablet Oral Daily  . pantoprazole  40 mg Oral Daily  . sertraline  100 mg Oral Daily  . sodium chloride flush  3 mL Intravenous Once   Continuous Infusions: . [START ON 09/21/2019] azithromycin    . [START ON 09/21/2019] cefTRIAXone (ROCEPHIN)  IV    . diltiazem (CARDIZEM) infusion Stopped (09/20/19 0455)   PRN Meds: acetaminophen **OR** acetaminophen, albuterol, loratadine  Allergies:   No Known Allergies  Social History:   Social History   Socioeconomic History  . Marital status: Divorced    Spouse name: Not on file  . Number of children: Not on file  . Years of education: Not on file  . Highest education level: Not on file  Occupational History  . Occupation: Counsellor  Tobacco Use  . Smoking status: Never Smoker  . Smokeless tobacco: Never Used  Substance and Sexual Activity  . Alcohol use: Yes    Alcohol/week: 3.0 standard drinks    Types: 3 Glasses of wine per week  . Drug use: Not on file  . Sexual activity: Not on file  Other Topics Concern  . Not on file  Social History Narrative  . Not on file   Social Determinants of Health   Financial Resource Strain:   . Difficulty of Paying Living Expenses: Not on file  Food Insecurity:   . Worried About Charity fundraiser in the Last Year: Not on file  . Ran Out of Food in the Last Year: Not on file  Transportation Needs:   . Lack of Transportation (Medical): Not on file  . Lack of Transportation (Non-Medical): Not on file  Physical Activity:   . Days of Exercise per Week: Not on file  . Minutes of Exercise per Session: Not on file  Stress:   . Feeling of Stress : Not on file  Social Connections:   . Frequency of Communication with Friends and Family: Not on file  . Frequency of Social Gatherings with Friends and Family: Not on file  . Attends Religious Services: Not on file  . Active Member of Clubs or Organizations: Not on file  . Attends Theatre manager Meetings: Not on file  . Marital Status: Not on file  Intimate Partner Violence:   . Fear of Current or Ex-Partner: Not on file  . Emotionally Abused: Not on file  . Physically Abused: Not on file  . Sexually Abused: Not on file    Family History:    Family History  Problem Relation Age of Onset  . Colon cancer Father   . Heart disease Paternal Grandfather      ROS:  Please see the history of present illness.   All other ROS reviewed and negative.     Physical Exam/Data:   Vitals:   09/20/19 0409 09/20/19 0422  09/20/19 0438 09/20/19 0444  BP: (!) 94/56 (!) 124/96 (!) 116/99 (!) 116/99  Pulse:  (!) 151 (!) 133 93  Resp:  (!) 22 18   Temp:      TempSrc:      SpO2:  94% 94%   Weight:      Height:       No intake or output data in the 24 hours ending 09/20/19 0505 Last 3 Weights 09/19/2019 07/08/2016 05/12/2015  Weight (lbs) 300 lb 300 lb 307 lb  Weight (kg) 136.079 kg 136.079 kg 139.254 kg     Body mass index is 37.5 kg/m.  General:  Well nourished, well developed, in no acute distress HEENT: normal Lymph: no adenopathy Neck: no JVD Endocrine:  No thryomegaly Vascular: No carotid bruits; DP pulses not palpable Cardiac:  normal S1, S2; irreg irreg; no murmur  Lungs:  Decreased BS bilaterally Abd: soft, nontender, no hepatomegaly  Ext: 1+ bilat edema Musculoskeletal:  No deformities, BUE and BLE strength normal and equal Skin: warm and dry  Neuro:  CNs 2-12 intact, no focal abnormalities noted Psych:  Normal affect   EKG:  The EKG was personally reviewed and demonstrates:  afib w/ HR 147, PVC, possible old inferior/anterior infarct Telemetry:  Telemetry was personally reviewed and demonstrates:  afib w/ HR in 130s, occas PVC   Relevant CV Studies: none  Laboratory Data:  High Sensitivity Troponin:   Recent Labs  Lab 09/19/19 2342 09/20/19 0217  TROPONINIHS 11 12     Chemistry Recent Labs  Lab 09/19/19 2342  NA 142  K 3.7  CL 102  CO2  26  GLUCOSE 132*  BUN 29*  CREATININE 1.21  CALCIUM 9.8  GFRNONAA 59*  GFRAA >60  ANIONGAP 14    No results for input(s): PROT, ALBUMIN, AST, ALT, ALKPHOS, BILITOT in the last 168 hours. Hematology Recent Labs  Lab 09/19/19 2342  WBC 15.4*  RBC 4.15*  HGB 12.9*  HCT 41.0  MCV 98.8  MCH 31.1  MCHC 31.5  RDW 13.3  PLT 228   BNPNo results for input(s): BNP, PROBNP in the last 168 hours.  DDimer No results for input(s): DDIMER in the last 168 hours.   Radiology/Studies:  DG Chest Port 1 View  Result Date: 09/20/2019 CLINICAL DATA:  Shortness of breath for 3 weeks EXAM: PORTABLE CHEST 1 VIEW COMPARISON:  07/08/2016 FINDINGS: Cardiac shadow is mildly prominent but accentuated by the portable technique. Bibasilar opacities are noted right greater than left likely representing early infiltrate. No sizable effusion is seen. No bony abnormality is noted. IMPRESSION: Mild bibasilar infiltrates right greater than left. Electronically Signed   By: Alcide Clever M.D.   On: 09/20/2019 00:01         Assessment and Plan:   1. Afib: presumably new-dx. Pt needs to be anti-coagulated as duration is uncertain; start heparin IV gtt. cardizem is dropping pressure, so recommend d/c cardizem and try switching over to PO metoprolol IR. Can start w/ 25mg  POq6h and up-titrate as tolerated. Unclear if true PNA vs congestion due to new afib, but if infectious, rate will be difficult to control until pulm infection is treated. Would avoid amiodarone for rate control until an assessment for LAA clot can be performed as there is a risk of unintended cardioversion to SR. Pt does seem volume-up, so recommend diuresis w/ IV lasix. K was 3.7--recommend supplement w/ KCl now, keep K>4. Check Mag and supplement to maintain Mag>2. Will get TTE, ideally when  pt's HR is better controlled or back in sinus to get more accurate idea of LVEF (RVR can make LVEF appear depressed). Consider TEE/DCCV ultimately, although as  aforementioned pt may not hold sinus rhythm until underlying drivers have been addressed. 2. Hypotension: hold valsartan for now. 3. Volume overload: cautious diuresis w/ IV lasix as above. Needs TTE to assess LVEF 4. DOE/CP: likely combination of afib, +/- PNA, congestion from loss of atrial kick. Recommend diuresis as above. Treat PNA. Once pt a little more euvolemic, on treatment for PNA, can consider TEE/DCCV as above. Consider ischemia eval at some point as well if sx persist after acute issues have been addressed.     For questions or updates, please contact [facility] Please consult [email or web address] for contact info under     Signed, Precious Reel, MD  09/20/2019  5:20 AM

## 2019-09-20 NOTE — Progress Notes (Signed)
PHARMACIST - PHYSICIAN ORDER COMMUNICATION  CONCERNING: P&T Medication Policy on Herbal Medications  DESCRIPTION:  This patient's order for:  Glucosamine-Chondroitin  has been noted.  This product(s) is classified as an "herbal" or natural product. Due to a lack of definitive safety studies or FDA approval, nonstandard manufacturing practices, plus the potential risk of unknown drug-drug interactions while on inpatient medications, the Pharmacy and Therapeutics Committee does not permit the use of "herbal" or natural products of this type within [facility].   ACTION TAKEN: The pharmacy department is unable to verify this order at this time and your patient has been informed of this safety policy. Please reevaluate patient's clinical condition at discharge and address if the herbal or natural product(s) should be resumed at that time.   

## 2019-09-20 NOTE — Discharge Instructions (Signed)
Information on my medicine - ELIQUIS (apixaban)  This medication education was reviewed with me or my healthcare representative as part of my discharge preparation.  The pharmacist that spoke with me during my hospital stay was:  Jackqulyn Mendel C Dedrick Heffner, RPH  Why was Eliquis prescribed for you? Eliquis was prescribed for you to reduce the risk of a blood clot forming that can cause a stroke if you have a medical condition called atrial fibrillation (a type of irregular heartbeat).  What do You need to know about Eliquis ? Take your Eliquis TWICE DAILY - one tablet in the morning and one tablet in the evening with or without food. If you have difficulty swallowing the tablet whole please discuss with your pharmacist how to take the medication safely.  Take Eliquis exactly as prescribed by your doctor and DO NOT stop taking Eliquis without talking to the doctor who prescribed the medication.  Stopping may increase your risk of developing a stroke.  Refill your prescription before you run out.  After discharge, you should have regular check-up appointments with your healthcare provider that is prescribing your Eliquis.  In the future your dose may need to be changed if your kidney function or weight changes by a significant amount or as you get older.  What do you do if you miss a dose? If you miss a dose, take it as soon as you remember on the same day and resume taking twice daily.  Do not take more than one dose of ELIQUIS at the same time to make up a missed dose.  Important Safety Information A possible side effect of Eliquis is bleeding. You should call your healthcare provider right away if you experience any of the following: ? Bleeding from an injury or your nose that does not stop. ? Unusual colored urine (red or dark brown) or unusual colored stools (red or black). ? Unusual bruising for unknown reasons. ? A serious fall or if you hit your head (even if there is no bleeding).  Some  medicines may interact with Eliquis and might increase your risk of bleeding or clotting while on Eliquis. To help avoid this, consult your healthcare provider or pharmacist prior to using any new prescription or non-prescription medications, including herbals, vitamins, non-steroidal anti-inflammatory drugs (NSAIDs) and supplements.  This website has more information on Eliquis (apixaban): http://www.eliquis.com/eliquis/home  

## 2019-09-20 NOTE — Progress Notes (Signed)
  PROGRESS NOTE  Patient admitted earlier this morning. See H&P. Brandon Sanchez is a 74 y.o. male with medical history significant of allergic rhinitis, asthma, insulin-dependent type 2 diabetes, hypertension, hyperlipidemia presenting to the ED with complaints of shortness of breath and chest pain.  EMS found patient to be in A. fib with RVR with rate 150-180.  He was given IV Cardizem bolus by EMS.  Patient states he has chronic shortness of breath due to his asthma but for the past 1 month it has been much worse.  A. fib with RVR with rate up to 150s in the ED. Chest x-ray showing mild bibasilar infiltrates, right greater than left. Patient received Cardizem bolus and was started on infusion.  Received Lovenox 1 mg/kg for anticoagulation.  Received ceftriaxone and azithromycin for pneumonia. Due to hypotension with Cardizem gtt, it was stopped and cardiology consulted.  On exam, states that his breathing has improved, but still quite winded when getting up to the bathroom. No chest pain.  Patient remains in irregular rhythm, heart rate ranging from 109-140s.  Appears comfortable on examination without distress.  On room air.   A/P:   New onset A. fib with RVR -TSH 1.103 -Cardiology following -Eliquis  -Currently on metoprolol -Echocardiogram pending  Sepsis secondary to community-acquired pneumonia -Sepsis present on admission with leukocytosis and tachycardia -COVID-19 negative, influenza negative -Blood cultures pending -Chest x-ray bibasilar infiltrates R > L -Ceftriaxone and azithromycin  Insulin-dependent type 2 diabetes, well controlled -Hemoglobin A1c 6.5 -Continue home NovoLog 70/30 as well as sliding scale insulin  Essential hypertension -Hold home valsartan due to hypotension -Continue metoprolol, monitor BP closely  Hyperlipidemia -Continue Lipitor  Hypomagnesemia -Replace, trend  Hypokalemia -Replace, trend  Depression -Continue zoloft     Noralee Stain,  DO [facility] 09/20/2019, 10:31 AM  Available via Epic secure chat 7am-7pm After these hours, please refer to coverage provider listed on [email or web address]

## 2019-09-20 NOTE — ED Notes (Signed)
Per MD Rathore, Cardizem drip to be DC after administration of lopressor. Will continue to monitor.

## 2019-09-20 NOTE — ED Notes (Signed)
Lunch Tray Ordered @ 1049. °

## 2019-09-20 NOTE — ED Notes (Signed)
Per RN Megan morning labs already gotten just not clicked off

## 2019-09-20 NOTE — ED Notes (Signed)
Cardiology at bedside.

## 2019-09-20 NOTE — Progress Notes (Signed)
Transitions of Care Pharmacist Note  ANDREE GOLPHIN is a 74 y.o. male that has been diagnosed with A Fib and will be prescribed Eliquis (apixaban) at discharge.   Patient Education: I provided the following education on Eliquis to the patient: How to take the medication Described what the medication is Signs of bleeding Signs/symptoms of VTE and stroke  Answered their questions  Discharge Medications Plan: If the patient is admitted to the hospital prior to discharge, he would like to have his discharge medications filled by the Transitions of Care pharmacy rather than his usual pharmacy.   Thank you,   Fara Olden, PharmD PGY-1 Pharmacy Resident   Please check amion for clinical pharmacist contact number   September 20, 2019

## 2019-09-20 NOTE — ED Provider Notes (Signed)
[facility] EMERGENCY DEPARTMENT Provider Note   CSN: [id number] Arrival date & time: 09/19/19  2334     History Chief Complaint  Patient presents with  . Atrial Fibrillation  . Shortness of Breath    Brandon Sanchez is a 74 y.o. male.  The history is provided by the patient.  Shortness of Breath Severity:  Severe Onset quality:  Gradual Duration:  3 weeks Timing:  Constant Progression:  Worsening Chronicity:  New Context: not animal exposure, not emotional upset and not fumes   Relieved by:  Nothing Worsened by:  Nothing Ineffective treatments:  None tried Associated symptoms: chest pain   Associated symptoms: no abdominal pain and no fever   Chest pain:    Severity:  Moderate   Onset quality:  Gradual   Duration:  1 day   Timing:  Intermittent   Progression:  Unchanged   Chronicity:  New Risk factors: no prolonged immobilization and no recent surgery   Patient presents with 3 weeks of SOB.  He denies palpitations or feeling that his heart was irregular or fast.  Had some CP with ambulation in the past 24 hours.  No cough, no f/c/r.       Past Medical History:  Diagnosis Date  . Allergic rhinitis, cause unspecified   . Asthma   . Other and unspecified hyperlipidemia   . Type II or unspecified type diabetes mellitus without mention of complication, not stated as uncontrolled   . Unspecified essential hypertension     Patient Active Problem List   Diagnosis Date Noted  . Obesity 05/12/2015  . DM 02/04/2008  . HYPERLIPIDEMIA 02/04/2008  . HYPERTENSION 02/04/2008  . ALLERGIC RHINITIS 02/04/2008  . 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 02/04/2008    History reviewed. No pertinent surgical history.     Family History  Problem Relation Age of Onset  . Colon cancer Father   . Heart disease Paternal Grandfather     Social History   Tobacco Use  . Smoking status: Never Smoker  . Smokeless tobacco: Never Used    Substance Use Topics  . Alcohol use: Yes    Alcohol/week: 3.0 standard drinks    Types: 3 Glasses of wine per week  . Drug use: Not on file    Home Medications Prior to Admission medications   Medication Sig Start Date End Date Taking? Authorizing Provider  acetaminophen (TYLENOL) 500 MG tablet Take 500 mg by mouth See admin instructions. 1500mg  in the morning before lunch, then 1500mg  at bedtime    [provider]  albuterol (PROAIR HFA) 108 (90 BASE) MCG/ACT inhaler 2 puffs every 4 hours as needed only  if your can't catch your breath 05/12/15   , MD  aspirin 81 MG tablet Take 81 mg by mouth daily.      [provider]  atorvastatin (LIPITOR) 40 MG tablet Take 40 mg by mouth daily.      [provider]  cetirizine-pseudoephedrine (ZYRTEC-D) 5-120 MG per tablet Take 1 tablet by mouth daily as needed.    [provider]  Fluticasone-Salmeterol (ADVAIR) 100-50 MCG/DOSE AEPB Inhale 1 puff into the lungs daily.    [provider]  furosemide (LASIX) 40 MG tablet 40 mg 3 (three) times daily. 2 tablets in AM and 1 tablet midday.    [provider]  Glucosamine-Chondroit-Vit C-Mn (GLUCOSAMINE CHONDR 1500 COMPLX) CAPS Take 1 capsule by mouth 2 (two) times daily.    [provider]  insulin aspart protamine- aspart (NOVOLOG MIX 70/30) (70-30) 100 UNIT/ML injection Inject 40 Units into the skin 2 (two) times daily at 8 am and 10 pm.    [provider]  Multiple Vitamin (MULTIVITAMIN WITH MINERALS) TABS tablet Take 1 tablet by mouth daily.    [provider]  omeprazole (PRILOSEC) 40 MG capsule Take 40 mg by mouth daily as needed.     [provider]  sertraline (ZOLOFT) 100 MG tablet Take 100 mg by mouth daily.    [provider]  valsartan (DIOVAN) 80 MG tablet Take 80 mg by mouth daily.    [provider]    Allergies    Patient has no known allergies.  Review of Systems    Review of Systems  Constitutional: Negative for fever.  HENT: Negative for congestion.   Eyes: Negative for visual disturbance.  Respiratory: Positive for shortness of breath.   Cardiovascular: Positive for chest pain and leg swelling. Negative for palpitations.  Gastrointestinal: Negative for abdominal pain.  Endocrine: Negative.   Genitourinary: Negative for difficulty urinating.  Neurological: Negative for weakness.  Psychiatric/Behavioral: Negative for agitation.  All other systems reviewed and are negative.   Physical Exam Updated Vital Signs BP 100/87   Pulse (!) 39   Temp 98.2 F (36.8 C) (Tympanic)   Resp 20   Ht 6\' 3"  (1.905 m)   Wt 136.1 kg   SpO2 92%   BMI 37.50 kg/m   Physical Exam Vitals and nursing note reviewed.  Constitutional:      Appearance: Normal appearance. He is not diaphoretic.  HENT:     Head: Normocephalic and atraumatic.     Nose: Nose normal.  Eyes:     Conjunctiva/sclera: Conjunctivae normal.     Pupils: Pupils are equal, round, and reactive to light.  Cardiovascular:     Rate and Rhythm: Tachycardia present. Rhythm irregular.     Pulses: Normal pulses.     Heart sounds: Normal heart sounds.  Pulmonary:     Effort: Tachypnea present.     Breath sounds: Decreased air movement present. Rales present.  Abdominal:     General: Bowel sounds are normal.     Palpations: Abdomen is soft.     Tenderness: There is no abdominal tenderness. There is no guarding or rebound.  Musculoskeletal:     Cervical back: Normal range of motion and neck supple.     Right lower leg: Edema present.     Left lower leg: Edema present.  Skin:    General: Skin is warm and dry.  Neurological:     General: No focal deficit present.     Mental Status: He is alert.     Deep Tendon Reflexes: Reflexes normal.  Psychiatric:        Mood and Affect: Mood normal.        Behavior: Behavior normal.     ED Results / Procedures / Treatments   Labs (all labs ordered  are listed, but only abnormal results are displayed) Results for orders placed or performed during the hospital encounter of 09/19/19  Basic metabolic panel  Result Value Ref Range   Sodium 142 135 - 145 mmol/L   Potassium 3.7 3.5 - 5.1 mmol/L   Chloride 102 98 - 111 mmol/L   CO2 26 22 - 32 mmol/L   Glucose, Bld 132 (H) 70 - 99 mg/dL   BUN 29 (H) 8 - 23 mg/dL   Creatinine, Ser 09/21/19 0.61 - 1.24 mg/dL  Calcium 9.8 8.9 - 10.3 mg/dL   GFR calc non Af Amer 59 (L) >60 mL/min   GFR calc Af Amer >60 >60 mL/min   Anion gap 14 5 - 15  CBC  Result Value Ref Range   WBC 15.4 (H) 4.0 - 10.5 K/uL   RBC 4.15 (L) 4.22 - 5.81 MIL/uL   Hemoglobin 12.9 (L) 13.0 - 17.0 g/dL   HCT 98.9 21.1 - 94.1 %   MCV 98.8 80.0 - 100.0 fL   MCH 31.1 26.0 - 34.0 pg   MCHC 31.5 30.0 - 36.0 g/dL   RDW 74.0 81.4 - 48.1 %   Platelets 228 150 - 400 K/uL   nRBC 0.0 0.0 - 0.2 %  Troponin I (High Sensitivity)  Result Value Ref Range   Troponin I (High Sensitivity) 11 <18 ng/L   DG Chest Port 1 View  Result Date: 09/20/2019 CLINICAL DATA:  Shortness of breath for 3 weeks EXAM: PORTABLE CHEST 1 VIEW COMPARISON:  07/08/2016 FINDINGS: Cardiac shadow is mildly prominent but accentuated by the portable technique. Bibasilar opacities are noted right greater than left likely representing early infiltrate. No sizable effusion is seen. No bony abnormality is noted. IMPRESSION: Mild bibasilar infiltrates right greater than left. Electronically Signed   By: Alcide Clever M.D.   On: 09/20/2019 00:01    EKG  EKG Interpretation  Date/Time:  Sunday September 19 2019 23:39:53 EST Ventricular Rate:  147 PR Interval:    QRS Duration: 105 QT Interval:  300 QTC Calculation: 470 R Axis:   -62 Text Interpretation: Atrial fibrillation with rapid V-rate Ventricular premature complex Inferior infarct, old Consider anterior infarct Lateral leads are also involved When compared with ECG of 07/08/2016, Atrial fibrillation with rapid ventricular  response has replaced Sinus tachycardia Confirmed by Dione Booze ([id number]) on 09/20/2019 1:22:10 AM       Radiology DG Chest Port 1 View  Result Date: 09/20/2019 CLINICAL DATA:  Shortness of breath for 3 weeks EXAM: PORTABLE CHEST 1 VIEW COMPARISON:  07/08/2016 FINDINGS: Cardiac shadow is mildly prominent but accentuated by the portable technique. Bibasilar opacities are noted right greater than left likely representing early infiltrate. No sizable effusion is seen. No bony abnormality is noted. IMPRESSION: Mild bibasilar infiltrates right greater than left. Electronically Signed   By: Alcide Clever M.D.   On: 09/20/2019 00:01    Procedures Procedures (including critical care time)  Medications Ordered in ED Medications  sodium chloride flush (NS) 0.9 % injection 3 mL (has no administration in time range)  diltiazem (CARDIZEM) 1 mg/mL load via infusion 10 mg (10 mg Intravenous Bolus from Bag 09/20/19 0016)    And  diltiazem (CARDIZEM) 125 mg in dextrose 5% 125 mL (1 mg/mL) infusion (5 mg/hr Intravenous New Bag/Given 09/20/19 0015)  cefTRIAXone (ROCEPHIN) 1 g in sodium chloride 0.9 % 100 mL IVPB (has no administration in time range)  azithromycin (ZITHROMAX) 500 mg in sodium chloride 0.9 % 250 mL IVPB (has no administration in time range)  enoxaparin (LOVENOX) injection 135 mg (135 mg Subcutaneous Given 09/20/19 0017)    ED Course  I have reviewed the triage vital signs and the nursing notes.  Pertinent labs & imaging results that were available during my care of the patient were reviewed by me and considered in my medical decision making (see chart for details).  CHA2DS2-VASc Score = 4 The patient's score is based upon: CHF History: Yes HTN History: Yes Age : 12-74 Diabetes History: Yes Stroke History: No Vascular  Disease History: No Gender: Male      ASSESSMENT AND PLAN: Paroxysmal Atrial Fibrillation (ICD10:  I48.0) The patient's CHA2DS2-VASc score is 4, indicating a 4.8% annual  risk of stroke.   Secondary Hypercoagulable State (ICD10:  D68.69) The patient is at significant risk for stroke/thromboembolism based upon his CHA2DS2-VASc Score of 4.    I WILL NOT BE CARDIOVERTING THIS PATIENT AS I DO NOT KNOW WHEN IN THE LAST 3 WEEKS THE PATIENT FLIPPED INTO AFIB AND THE PATIENT IS NOT ANTICOAGULATED.    Signed,  Shemiah Rosch K Manessa Buley-Rasch, MD    09/20/2019 1:18 AM    MDM Reviewed: nursing note and vitals Interpretation: labs, ECG and x-ray (PNA on CXR by me, normal troponin) Total time providing critical care: 75-105 minutes (diltiazem drip). This excludes time spent performing separately reportable procedures and services. Consults: admitting MD  CRITICAL CARE Performed by: Leone Putman K Valor [place]-Rasch Total critical care time: 75 minutes Critical care time was exclusive of separately billable procedures and treating other patients. Critical care was necessary to treat or prevent imminent or life-threatening deterioration. Critical care was time spent personally by me on the following activities: development of treatment plan with patient and/or surrogate as well as nursing, discussions with consultants, evaluation of patient's response to treatment, examination of patient, obtaining history from patient or surrogate, ordering and performing treatments and interventions, ordering and review of laboratory studies, ordering and review of radiographic studies, pulse oximetry and re-evaluation of patient's condition.  Final Clinical Impression(s) / ED Diagnoses Final diagnoses:  Atrial fibrillation with RVR ([place])   Admit to medicine    Eldred Lievanos, MD 09/20/19 7026

## 2019-09-20 NOTE — ED Notes (Addendum)
Cardiologist MD Daphine Deutscher at bedside, instructed this RN to DC Cardizem, start Heparin instead. Will administer per orders

## 2019-09-20 NOTE — ED Notes (Signed)
2L [place] discontinued per MD Rathore request. Pt sat remain >94%

## 2019-09-20 NOTE — Progress Notes (Signed)
Progress Note  Patient Name: ROBERTS Sanchez Date of Encounter: 09/20/2019  Primary Cardiologist: Dr. Concha Pyo  Subjective   Patient feels mildly better this AM. Feels some dull CP. Still has some SOB. Remains in afib rates 120-130s. Blood pressures soft.   Inpatient Medications    Scheduled Meds: . aspirin EC  81 mg Oral Daily  . atorvastatin  40 mg Oral Daily  . fluticasone furoate-vilanterol  1 puff Inhalation Daily  . insulin aspart  0-9 Units Subcutaneous TID WC  . insulin aspart protamine- aspart  40 Units Subcutaneous BID WC  . metoprolol tartrate  25 mg Oral Q6H  . multivitamin with minerals  1 tablet Oral Daily  . pantoprazole  40 mg Oral Daily  . potassium chloride  40 mEq Oral Once  . sertraline  100 mg Oral Daily  . sodium chloride flush  3 mL Intravenous Once   Continuous Infusions: . [START ON 09/21/2019] azithromycin    . [START ON 09/21/2019] cefTRIAXone (ROCEPHIN)  IV    . heparin     PRN Meds: acetaminophen **OR** acetaminophen, albuterol, loratadine   Vital Signs    Vitals:   09/20/19 0800 09/20/19 0830 09/20/19 0849 09/20/19 0858  BP: 95/61 (!) 121/107    Pulse: (!) 45 (!) 44 (!) 128 (!) 54  Resp: 20     Temp:      TempSrc:      SpO2: 93% 96% 96% 95%  Weight:      Height:       No intake or output data in the 24 hours ending 09/20/19 0935 Last 3 Weights 09/19/2019 07/08/2016 05/12/2015  Weight (lbs) 300 lb 300 lb 307 lb  Weight (kg) 136.079 kg 136.079 kg 139.254 kg      Telemetry    Afib, HR 120-130s; short runs of NSVT, longest 4 beats - Personally Reviewed  ECG    Am EKG pending - Personally Reviewed  Physical Exam   GEN: No acute distress.   Neck: No JVD Cardiac: Irreg Irreg, no murmurs, rubs, or gallops.  Respiratory: diminished breath sounds at bases bilaterally. GI: Soft, nontender, non-distended  MS: Mild edema; No deformity. Neuro:  Nonfocal  Psych: Normal affect   Labs    High Sensitivity Troponin:   Recent Labs  Lab  09/19/19 2342 09/20/19 0217  TROPONINIHS 11 12      Chemistry Recent Labs  Lab 09/19/19 2342  NA 142  K 3.7  CL 102  CO2 26  GLUCOSE 132*  BUN 29*  CREATININE 1.21  CALCIUM 9.8  GFRNONAA 59*  GFRAA >60  ANIONGAP 14     Hematology Recent Labs  Lab 09/19/19 2342 09/20/19 0515  WBC 15.4* 16.6*  RBC 4.15* 3.95*  HGB 12.9* 12.4*  HCT 41.0 38.8*  MCV 98.8 98.2  MCH 31.1 31.4  MCHC 31.5 32.0  RDW 13.3 13.2  PLT 228 204    BNP Recent Labs  Lab 09/20/19 0515  BNP 289.1*     DDimer No results for input(s): DDIMER in the last 168 hours.   Radiology    DG Chest Port 1 View  Result Date: 09/20/2019 CLINICAL DATA:  Shortness of breath for 3 weeks EXAM: PORTABLE CHEST 1 VIEW COMPARISON:  07/08/2016 FINDINGS: Cardiac shadow is mildly prominent but accentuated by the portable technique. Bibasilar opacities are noted right greater than left likely representing early infiltrate. No sizable effusion is seen. No bony abnormality is noted. IMPRESSION: Mild bibasilar infiltrates right greater than left. Electronically  Signed   By: Alcide Clever M.D.   On: 09/20/2019 00:01    Cardiac Studies   Echo ordered  Patient Profile     74 y.o. male with hx of asthma, DM2, dyslipidemia, HTN who is being seen for new onset Afib RVR.  Assessment & Plan    New onset Afib RVR - In the setting of possible PNA - Rates were up into the 140's and started on cardizem drip but due to hypotension was switched to Lopressor 25 mg q6hours. Did not want to start amio due to risk of conversion. Might want to avoid BB due to h/o of asthma - Hs troponin 11 > 12 - IV heparin started - echo ordered. No previous echo in chart - K 3.7, goal >4 - Mag 1.6, goal >2 - TSH 1.1 - CHADSVASC = 3 (age, HTN, DM2) Patient needs long term anticoagulation. Will start Eliquis 5 mg BID. Stop heparin - Will plan for TEE/DCCV  - Suspect Afib will improved with treatment of PNA  PNA - per CXR bibasilar opacities  likely representing early infiltrate - WBC 16.6 - COVID negative - Blood cultures pending - abx per IM  Fluid overload - appeared to be mildly volume up on exam. He says he is on Lasix daily (not sure of the dose) - BNP 289 - No Lasix given 2/2 to hypotension - echo pending  Chest pain - likely due to Afib RVR +/- PNA - Patient reports a cardiac cath with normal cors about 10 years ago - Hs troponin 11 > 12  - echo as above  HTN - Patient has been hypotensive, most recent 95/61 - At home was on valsartan 80 mg daily  HLD - continue Lipitor  DM2 - SSI per IM  For questions or updates, please contact [facility] Please consult [email or web address] for contact info under        Signed, Brandon Grunewald David Stall, PA-C  09/20/2019, 9:35 AM

## 2019-09-20 NOTE — ED Notes (Signed)
Breakfast ordered 

## 2019-09-21 ENCOUNTER — Inpatient Hospital Stay (HOSPITAL_COMMUNITY): Payer: Medicare Other

## 2019-09-21 DIAGNOSIS — I959 Hypotension, unspecified: Secondary | ICD-10-CM

## 2019-09-21 DIAGNOSIS — R600 Localized edema: Secondary | ICD-10-CM

## 2019-09-21 DIAGNOSIS — J189 Pneumonia, unspecified organism: Secondary | ICD-10-CM

## 2019-09-21 LAB — GLUCOSE, CAPILLARY
Glucose-Capillary: 149 mg/dL — ABNORMAL HIGH (ref 70–99)
Glucose-Capillary: 113 mg/dL — ABNORMAL HIGH (ref 70–99)
Glucose-Capillary: 115 mg/dL — ABNORMAL HIGH (ref 70–99)
Glucose-Capillary: 102 mg/dL — ABNORMAL HIGH (ref 70–99)

## 2019-09-21 LAB — BASIC METABOLIC PANEL
Anion gap: 15 (ref 5–15)
BUN: 47 mg/dL — ABNORMAL HIGH (ref 8–23)
CO2: 20 mmol/L — ABNORMAL LOW (ref 22–32)
Calcium: 9.5 mg/dL (ref 8.9–10.3)
Chloride: 103 mmol/L (ref 98–111)
Creatinine, Ser: 2.42 mg/dL — ABNORMAL HIGH (ref 0.61–1.24)
GFR calc Af Amer: 30 mL/min — ABNORMAL LOW (ref >60)
GFR calc non Af Amer: 26 mL/min — ABNORMAL LOW (ref >60)
Glucose, Bld: 127 mg/dL — ABNORMAL HIGH (ref 70–99)
Potassium: 4.3 mmol/L (ref 3.5–5.1)
Sodium: 138 mmol/L (ref 135–145)

## 2019-09-21 LAB — CBC
HCT: 38.7 % — ABNORMAL LOW (ref 39.0–52.0)
Hemoglobin: 12.3 g/dL — ABNORMAL LOW (ref 13.0–17.0)
MCH: 31 pg (ref 26.0–34.0)
MCHC: 31.8 g/dL (ref 30.0–36.0)
MCV: 97.5 fL (ref 80.0–100.0)
Platelets: 237 10*3/uL (ref 150–400)
RBC: 3.97 MIL/uL — ABNORMAL LOW (ref 4.22–5.81)
RDW: 13.2 % (ref 11.5–15.5)
WBC: 17 10*3/uL — ABNORMAL HIGH (ref 4.0–10.5)
nRBC: 0 % (ref 0.0–0.2)

## 2019-09-21 LAB — MAGNESIUM: Magnesium: 2.4 mg/dL (ref 1.7–2.4)

## 2019-09-21 LAB — MRSA PCR SCREENING: MRSA by PCR: NEGATIVE

## 2019-09-21 MED ORDER — SODIUM CHLORIDE 0.9 % IV SOLN
INTRAVENOUS | Status: DC | PRN
  Administered 2019-09-21: 1000 mL via INTRAVENOUS

## 2019-09-21 MED ORDER — FUROSEMIDE 10 MG/ML IJ SOLN
40.0000 mg | Freq: Two times a day (BID) | INTRAMUSCULAR | Status: DC
  Administered 2019-09-21 – 2019-09-22 (×3): 40 mg via INTRAVENOUS
  Filled 2019-09-21 (×3): qty 4

## 2019-09-21 MED ORDER — SODIUM CHLORIDE 0.9 % IV SOLN: INTRAVENOUS | Status: DC

## 2019-09-21 NOTE — Progress Notes (Signed)
PROGRESS NOTE    Brandon Sanchez  ZOX:[id number] DOB: Dec 10, 1945 DOA: 09/19/2019 PCP: Merri BrunettePharr, Walter, MD     Brief Narrative:  Brandon GarbeWilliam C Ingramis a 73 y.o.malewith medical history significant ofallergic rhinitis, asthma, insulin-dependent type 2 diabetes, hypertension, hyperlipidemia presenting to the ED with complaints of shortness of breath and chest pain. EMS found patient to be in A. fib with RVR with rate 150-180. He was given IV Cardizem bolus by EMS. Patient states he has chronic shortness of breath due to his asthma but for the past 1 month it has been much worse. A. fib with RVR with rate up to 150s in the ED. Chest x-ray showing mild bibasilar infiltrates, right greater than left. Patient received Cardizem bolus and was started on infusion. Received Lovenox 1 mg/kg for anticoagulation. Received ceftriaxone and azithromycin for pneumonia. Due to hypotension with Cardizem gtt, it was stopped and cardiology consulted.  New events last 24 hours / Subjective: States that he feels very short of breath with activity such as going to the bathroom and back to bed. No further CP. HR remains irreg with elevated rate   Assessment & Plan:   Principal Problem:   Atrial fibrillation with rapid ventricular response (HCC) Active Problems:   Diabetes mellitus (HCC)   HLD (hyperlipidemia)   HTN (hypertension)   CAP (community acquired pneumonia)    New onset A. fib with RVR -TSH 1.103 -Echocardiogram EF 50-55%, LV diastolic function could not be evaluated, no regional wall motion abnormalities  -Started on Eliquis  -Currently on metoprolol -Cardiology following; planning for TEE-DCCV 2/3  -Giving IV lasix due to fluid overload, monitor BMP   Sepsis secondary to community-acquired pneumonia -Sepsis present on admission with leukocytosis and tachycardia -COVID-19 negative, influenza negative -Blood cultures negative to date  -Chest x-ray bibasilar infiltrates R > L -Ceftriaxone  and azithromycin  Insulin-dependent type 2 diabetes, well controlled -Hemoglobin A1c 6.5 -Continue home NovoLog 70/30 as well as sliding scale insulin  Essential hypertension -Hold home valsartan due to hypotension, AKI  -Continue metoprolol, monitor BP closely  Hyperlipidemia -Continue Lipitor  Depression -Continue zoloft   AKI  -Cr 1.21 --> 2.42 -Check renal US -Check FeNa  -Monitor UOP    DVT prophylaxis: Eliquis Code Status: Full Family Communication: None at bedside Disposition Plan: Patient is from home prior to admission. Currently in-hospital treatment needed due to A Fib RVR. Barrier(s) to discharge include cardioversion planned 2/3 and suspect patient will discharge to home.    Consultants:   Cardiology   Procedures:   None   Antimicrobials:  Anti-infectives (From admission, onward)   Start     Dose/Rate Route Frequency Ordered Stop   09/21/19 2200  azithromycin (ZITHROMAX) 500 mg in sodium chloride 0.9 % 250 mL IVPB     500 mg 250 mL/hr over 60 Minutes Intravenous Daily at bedtime 09/20/19 0325     09/21/19 2200  cefTRIAXone (ROCEPHIN) 1 g in sodium chloride 0.9 % 100 mL IVPB     1 g 200 mL/hr over 30 Minutes Intravenous Daily at bedtime 09/20/19 0325     09/20/19 0030  cefTRIAXone (ROCEPHIN) 1 g in sodium chloride 0.9 % 100 mL IVPB     1 g 200 mL/hr over 30 Minutes Intravenous  Once 09/20/19 0019 09/20/19 0257   09/20/19 0030  azithromycin (ZITHROMAX) 500 mg in sodium chloride 0.9 % 250 mL IVPB     500 mg 250 mL/hr over 60 Minutes Intravenous  Once 09/20/19 0019 09/20/19 0345  Objective: Vitals:   09/21/19 0413 09/21/19 0416 09/21/19 0739 09/21/19 0907  BP:   99/77   Pulse: (!) 128  84   Resp:   20   Temp:  98.5 F (36.9 C) 97.9 F (36.6 C)   TempSrc:  Oral Oral   SpO2:   94% 95%  Weight:      Height:        Intake/Output Summary (Last 24 hours) at 09/21/2019 1004 Last data filed at 09/21/2019 2376 Gross per 24 hour  Intake  430.95 ml  Output 400 ml  Net 30.95 ml   Filed Weights   09/19/19 2339  Weight: 136.1 kg    Examination:  General exam: Appears calm and comfortable  Respiratory system: Appears tachypneic, had just returned from the bathroom, some mild conversational dyspnea Cardiovascular system: S1 & S2 heard, tachycardic, irregular rhythm. No murmurs.  Bilateral +1 pedal edema. Gastrointestinal system: Abdomen is nondistended, soft and nontender. Normal bowel sounds heard. Central nervous system: Alert and oriented. No focal neurological deficits. Speech clear.  Extremities: Symmetric in appearance  Skin: No rashes, lesions or ulcers on exposed skin  Psychiatry: Judgement and insight appear normal. Mood & affect appropriate.   Data Reviewed: I have personally reviewed following labs and imaging studies  CBC: Recent Labs  Lab 09/19/19 2342 09/20/19 0515 09/21/19 0250  WBC 15.4* 16.6* 17.0*  HGB 12.9* 12.4* 12.3*  HCT 41.0 38.8* 38.7*  MCV 98.8 98.2 97.5  PLT 228 204 237   Basic Metabolic Panel: Recent Labs  Lab 09/19/19 2342 09/20/19 0515 09/21/19 0250  NA 142  --  138  K 3.7  --  4.3  CL 102  --  103  CO2 26  --  20*  GLUCOSE 132*  --  127*  BUN 29*  --  47*  CREATININE 1.21  --  2.42*  CALCIUM 9.8  --  9.5  MG  --  1.6* 2.4   GFR: Estimated Creatinine Clearance: 41 mL/min (A) (by C-G formula based on SCr of 2.42 mg/dL (H)). Liver Function Tests: No results for input(s): AST, ALT, ALKPHOS, BILITOT, PROT, ALBUMIN in the last 168 hours. No results for input(s): LIPASE, AMYLASE in the last 168 hours. No results for input(s): AMMONIA in the last 168 hours. Coagulation Profile: No results for input(s): INR, PROTIME in the last 168 hours. Cardiac Enzymes: No results for input(s): CKTOTAL, CKMB, CKMBINDEX, TROPONINI in the last 168 hours. BNP (last 3 results) No results for input(s): PROBNP in the last 8760 hours. HbA1C: Recent Labs    09/20/19 0515  HGBA1C 6.5*    CBG: Recent Labs  Lab 09/20/19 0807 09/20/19 1224 09/20/19 1714 09/20/19 2047 09/21/19 0636  GLUCAP 214* 175* 155* 158* 149*   Lipid Profile: No results for input(s): CHOL, HDL, LDLCALC, TRIG, CHOLHDL, LDLDIRECT in the last 72 hours. Thyroid Function Tests: Recent Labs    09/20/19 0515  TSH 1.103   Anemia Panel: No results for input(s): VITAMINB12, FOLATE, FERRITIN, TIBC, IRON, RETICCTPCT in the last 72 hours. Sepsis Labs: No results for input(s): PROCALCITON, LATICACIDVEN in the last 168 hours.  Recent Results (from the past 240 hour(s))  Respiratory Panel by RT PCR (Flu A&B, Covid) - Nasopharyngeal Swab     Status: None   Collection Time: 09/20/19 12:35 AM   Specimen: Nasopharyngeal Swab  Result Value Ref Range Status   SARS Coronavirus 2 by RT PCR NEGATIVE NEGATIVE Final    Comment: (NOTE) SARS-CoV-2 target nucleic acids are NOT DETECTED.  The SARS-CoV-2 RNA is generally detectable in upper respiratoy specimens during the acute phase of infection. The lowest concentration of SARS-CoV-2 viral copies this assay can detect is 131 copies/mL. A negative result does not preclude SARS-Cov-2 infection and should not be used as the sole basis for treatment or other patient management decisions. A negative result may occur with  improper specimen collection/handling, submission of specimen other than nasopharyngeal swab, presence of viral mutation(s) within the areas targeted by this assay, and inadequate number of viral copies (<131 copies/mL). A negative result must be combined with clinical observations, patient history, and epidemiological information. The expected result is Negative. Fact Sheet for Patients:  https://www.moore.com/https://www.fda.gov/media/142436/download Fact Sheet for Healthcare Providers:  https://www.young.biz/https://www.fda.gov/media/142435/download This test is not yet ap proved or cleared by the [place] FDA and  has been authorized for detection and/or diagnosis of SARS-CoV-2  by FDA under an Emergency Use Authorization (EUA). This EUA will remain  in effect (meaning this test can be used) for the duration of the COVID-19 declaration under Section 564(b)(1) of the Act, 21 U.S.C. section 360bbb-3(b)(1), unless the authorization is terminated or revoked sooner.    Influenza A by PCR NEGATIVE NEGATIVE Final   Influenza B by PCR NEGATIVE NEGATIVE Final    Comment: (NOTE) The Xpert Xpress SARS-CoV-2/FLU/RSV assay is intended as an aid in  the diagnosis of influenza from Nasopharyngeal swab specimens and  should not be used as a sole basis for treatment. Nasal washings and  aspirates are unacceptable for Xpert Xpress SARS-CoV-2/FLU/RSV  testing. Fact Sheet for Patients: https://www.moore.com/https://www.fda.gov/media/142436/download Fact Sheet for Healthcare Providers: https://www.young.biz/https://www.fda.gov/media/142435/download This test is not yet approved or cleared by the [place] FDA and  has been authorized for detection and/or diagnosis of SARS-CoV-2 by  FDA under an Emergency Use Authorization (EUA). This EUA will remain  in effect (meaning this test can be used) for the duration of the  Covid-19 declaration under Section 564(b)(1) of the Act, 21  U.S.C. section 360bbb-3(b)(1), unless the authorization is  terminated or revoked. Performed at [facility], [address] [place], [place] [address]   Blood culture (routine x 2)     Status: None (Preliminary result)   Collection Time: 09/20/19  1:25 AM   Specimen: BLOOD RIGHT HAND  Result Value Ref Range Status   Specimen Description BLOOD RIGHT HAND  Final   Special Requests   Final    BOTTLES DRAWN AEROBIC AND ANAEROBIC Blood Culture adequate volume   Culture NO GROWTH 1 DAY  Final   Report Status PENDING  Incomplete  Blood culture (routine x 2)     Status: None (Preliminary result)   Collection Time: 09/20/19  1:30 AM   Specimen: BLOOD LEFT WRIST  Result Value Ref Range Status   Specimen Description BLOOD LEFT WRIST  Final    Special Requests   Final    BOTTLES DRAWN AEROBIC AND ANAEROBIC Blood Culture results may not be optimal due to an excessive volume of blood received in culture bottles   Culture NO GROWTH 1 DAY  Final   Report Status PENDING  Incomplete  MRSA PCR Screening     Status: None   Collection Time: 09/21/19  1:16 AM   Specimen: Nasal Mucosa; Nasopharyngeal  Result Value Ref Range Status   MRSA by PCR NEGATIVE NEGATIVE Final    Comment:        The GeneXpert MRSA Assay (FDA approved for NASAL specimens only), is one component of a comprehensive MRSA colonization surveillance  program. It is not intended to diagnose MRSA infection nor to guide or monitor treatment for MRSA infections. Performed at [facility], [place] [address] [place], [place] [address]       Radiology Studies: DG Chest Port 1 View  Result Date: 09/20/2019 CLINICAL DATA:  Shortness of breath for 3 weeks EXAM: PORTABLE CHEST 1 VIEW COMPARISON:  07/08/2016 FINDINGS: Cardiac shadow is mildly prominent but accentuated by the portable technique. Bibasilar opacities are noted right greater than left likely representing early infiltrate. No sizable effusion is seen. No bony abnormality is noted. IMPRESSION: Mild bibasilar infiltrates right greater than left. Electronically Signed   By: Inez Catalina M.D.   On: 09/20/2019 00:01   ECHOCARDIOGRAM COMPLETE  Result Date: 09/20/2019   ECHOCARDIOGRAM REPORT   Patient Name:   CORDARIUS BENNING Date of Exam: 09/20/2019 Medical Rec #:  [id number]        Height:       75.0 in Accession #:    [id number]       Weight:       300.0 lb Date of Birth:  December 10, 1945        BSA:          2.61 m Patient Age:    32 years         BP:           93/65 mmHg Patient Gender: M                HR:           127 bpm. Exam Location:  Inpatient Procedure: 2D Echo and Intracardiac Opacification Agent Indications:    Atrial Fibrillation 427.31 / I48.91  History:        Patient has no prior history of Echocardiogram  examinations.                 Risk Factors:Hypertension, Dyslipidemia, Diabetes and Obesity.  Sonographer:    Vikki Ports Turrentine Referring Phys: [id number] Shela Leff  Sonographer Comments: Technically difficult study due to poor echo windows. Image acquisition challenging due to patient body habitus. IMPRESSIONS  1. Left ventricular ejection fraction, by visual estimation, is 50 to 55%. The left ventricle has normal function. There is mildly increased left ventricular hypertrophy.  2. Definity contrast agent was given IV to delineate the left ventricular endocardial borders.  3. Left ventricular diastolic function could not be evaluated.  4. Mildly dilated left ventricular internal cavity size.  5. The left ventricle has no regional wall motion abnormalities.  6. Global right ventricle has normal systolic function.The right ventricular size is mildly enlarged.  7. Left atrial size was mildly dilated.  8. Right atrial size was mildly dilated.  9. Mild mitral annular calcification. 10. Trivial mitral valve regurgitation. No evidence of mitral stenosis. 11. The tricuspid valve is normal in structure. Tricuspid valve regurgitation is trivial. 12. The aortic valve was not well visualized. Aortic valve regurgitation is not visualized. Mild to moderate aortic valve stenosis. 13. The pulmonic valve was not well visualized. Pulmonic valve regurgitation is not visualized. 14. The inferior vena cava is dilated in size with <50% respiratory variability, suggesting right atrial pressure of 15 mmHg. 15. Technically difficult; definity used; low normal LV systolic function; mild LVH; mild LVE; calcified aortic valve with mild to moderate AS (mean gradient 19 mmHg); mild LAE/RAE/RVE. FINDINGS  Left Ventricle: Left ventricular ejection fraction, by visual estimation, is 50 to 55%. The left ventricle has normal function. Definity contrast agent  was given IV to delineate the left ventricular endocardial borders. The left  ventricle has no regional wall motion abnormalities. The left ventricular internal cavity size was mildly dilated left ventricle. There is mildly increased left ventricular hypertrophy. The left ventricular diastology could not be evaluated due to atrial fibrillation. Left ventricular diastolic function could not be evaluated. Normal left atrial pressure. Right Ventricle: The right ventricular size is mildly enlarged.Global RV systolic function is has normal systolic function. Left Atrium: Left atrial size was mildly dilated. Right Atrium: Right atrial size was mildly dilated Pericardium: There is no evidence of pericardial effusion. Mitral Valve: The mitral valve is normal in structure. Mild mitral annular calcification. Trivial mitral valve regurgitation. No evidence of mitral valve stenosis by observation. Tricuspid Valve: The tricuspid valve is normal in structure. Tricuspid valve regurgitation is trivial. Aortic Valve: The aortic valve was not well visualized. Aortic valve regurgitation is not visualized. Mild to moderate aortic stenosis is present. Aortic valve mean gradient measures 18.8 mmHg. Aortic valve peak gradient measures 29.9 mmHg. Aortic valve area, by VTI measures 0.99 cm. Pulmonic Valve: The pulmonic valve was not well visualized. Pulmonic valve regurgitation is not visualized. Pulmonic regurgitation is not visualized. Aorta: The aortic root is normal in size and structure. Venous: The inferior vena cava is dilated in size with less than 50% respiratory variability, suggesting right atrial pressure of 15 mmHg.  Additional Comments: Technically difficult; definity used; low normal LV systolic function; mild LVH; mild LVE; calcified aortic valve with mild to moderate AS (mean gradient 19 mmHg); mild LAE/RAE/RVE.  LEFT VENTRICLE PLAX 2D LVIDd:         5.55 cm LVIDs:         4.30 cm LV PW:         1.15 cm LV IVS:        1.15 cm LVOT diam:     2.10 cm LV SV:         67 ml LV SV Index:   24.70 LVOT  Area:     3.46 cm  LEFT ATRIUM             Index LA diam:        5.30 cm 2.03 cm/m LA Vol (A2C):   79.3 ml 30.42 ml/m LA Vol (A4C):   66.5 ml 25.51 ml/m LA Biplane Vol: 73.0 ml 28.00 ml/m  AORTIC VALVE AV Area (Vmax):    0.99 cm AV Area (Vmean):   0.91 cm AV Area (VTI):     0.99 cm AV Vmax:           273.20 cm/s AV Vmean:          204.600 cm/s AV VTI:            0.527 m AV Peak Grad:      29.9 mmHg AV Mean Grad:      18.8 mmHg LVOT Vmax:         78.30 cm/s LVOT Vmean:        53.800 cm/s LVOT VTI:          0.151 m LVOT/AV VTI ratio: 0.29  AORTA Ao Root diam: 2.90 cm MITRAL VALVE MV Area (PHT): 3.17 cm             SHUNTS MV PHT:        69.31 msec           Systemic VTI:  0.15 m MV Decel Time: 239 msec  Systemic Diam: 2.10 cm MV E velocity: 127.67 cm/s 103 cm/s  Olga Millers MD Electronically signed by Olga Millers MD Signature Date/Time: 09/20/2019/3:56:17 PM    Final       Scheduled Meds: . apixaban  5 mg Oral BID  . atorvastatin  40 mg Oral Daily  . fluticasone furoate-vilanterol  1 puff Inhalation Daily  . furosemide  40 mg Intravenous BID  . insulin aspart  0-9 Units Subcutaneous TID WC  . insulin aspart protamine- aspart  40 Units Subcutaneous BID WC  . metoprolol tartrate  25 mg Oral Q6H  . multivitamin with minerals  1 tablet Oral Daily  . pantoprazole  40 mg Oral Daily  . sertraline  100 mg Oral Daily  . sodium chloride flush  3 mL Intravenous Once   Continuous Infusions: . azithromycin    . cefTRIAXone (ROCEPHIN)  IV       LOS: 1 day      Time spent: 35 minutes   Noralee Stain, DO [facility] 09/21/2019, 10:04 AM   Available via Epic secure chat 7am-7pm After these hours, please refer to coverage provider listed on [email or web address]

## 2019-09-21 NOTE — Progress Notes (Signed)
Progress Note  Patient Name: Brandon Sanchez Date of Encounter: 09/21/2019  Primary Cardiologist: Buford Dresser, MD   Subjective   Reports that he has no further chest pain. Does feel more short of breath getting up to use bathroom. Daughter Vania Rea called during the visit. Reviewed TEE-CV procedure again today, risks/benefits. Asking when he can leave. I mentioned that this depends on whether the procedure is successful, how his breathing/fluid is doing, and plans for his pneumonia management. His procedure is scheduled for 8:30 tomorrow morning.  Inpatient Medications    Scheduled Meds: . apixaban  5 mg Oral BID  . atorvastatin  40 mg Oral Daily  . fluticasone furoate-vilanterol  1 puff Inhalation Daily  . furosemide  40 mg Intravenous BID  . insulin aspart  0-9 Units Subcutaneous TID WC  . insulin aspart protamine- aspart  40 Units Subcutaneous BID WC  . metoprolol tartrate  25 mg Oral Q6H  . multivitamin with minerals  1 tablet Oral Daily  . pantoprazole  40 mg Oral Daily  . sertraline  100 mg Oral Daily  . sodium chloride flush  3 mL Intravenous Once   Continuous Infusions: . azithromycin    . cefTRIAXone (ROCEPHIN)  IV     PRN Meds: acetaminophen **OR** acetaminophen, albuterol, loratadine   Vital Signs    Vitals:   09/21/19 0402 09/21/19 0413 09/21/19 0416 09/21/19 0739  BP: 98/86   99/77  Pulse: (!) 162 (!) 128  84  Resp: 20   20  Temp:   98.5 F (36.9 C) 97.9 F (36.6 C)  TempSrc:   Oral Oral  SpO2: 92%   94%  Weight:      Height:        Intake/Output Summary (Last 24 hours) at 09/21/2019 0848 Last data filed at 09/21/2019 0752 Gross per 24 hour  Intake 480.95 ml  Output 400 ml  Net 80.95 ml   Last 3 Weights 09/19/2019 07/08/2016 05/12/2015  Weight (lbs) 300 lb 300 lb 307 lb  Weight (kg) 136.079 kg 136.079 kg 139.254 kg      Telemetry    Afib, rates predominantly 130s-140s - Personally Reviewed  ECG    09/20/19 afib RVR - Personally  Reviewed  Physical Exam   GEN: No acute distress.   Neck: No JVD visible but difficult body habitus Cardiac: irregularly irregular S1 and S2, no murmurs, rubs, or gallops appreciated but may be limited by rapid rate Respiratory: Clear to auscultation bilaterally except for crackles at bilateral bases GI: Soft, nontender, non-distended  MS: bilateral 1+ pitting edema; No deformity. Neuro:  Nonfocal  Psych: Normal affect   Labs    High Sensitivity Troponin:   Recent Labs  Lab 09/19/19 2342 09/20/19 0217  TROPONINIHS 11 12      Chemistry Recent Labs  Lab 09/19/19 2342 09/21/19 0250  NA 142 138  K 3.7 4.3  CL 102 103  CO2 26 20*  GLUCOSE 132* 127*  BUN 29* 47*  CREATININE 1.21 2.42*  CALCIUM 9.8 9.5  GFRNONAA 59* 26*  GFRAA >60 30*  ANIONGAP 14 15     Hematology Recent Labs  Lab 09/19/19 2342 09/20/19 0515 09/21/19 0250  WBC 15.4* 16.6* 17.0*  RBC 4.15* 3.95* 3.97*  HGB 12.9* 12.4* 12.3*  HCT 41.0 38.8* 38.7*  MCV 98.8 98.2 97.5  MCH 31.1 31.4 31.0  MCHC 31.5 32.0 31.8  RDW 13.3 13.2 13.2  PLT 228 204 237    BNP Recent Labs  Lab  09/20/19 0515  BNP 289.1*     DDimer  Recent Labs  Lab 09/20/19 1013  DDIMER 1.04*     Radiology    CXR 09/19/19 FINDINGS: Cardiac shadow is mildly prominent but accentuated by the portable technique. Bibasilar opacities are noted right greater than left likely representing early infiltrate. No sizable effusion is seen. No bony abnormality is noted.  IMPRESSION: Mild bibasilar infiltrates right greater than left.  Cardiac Studies   Echo 09/20/19 1. Left ventricular ejection fraction, by visual estimation, is 50 to  55%. The left ventricle has normal function. There is mildly increased  left ventricular hypertrophy.  2. Definity contrast agent was given IV to delineate the left ventricular  endocardial borders.  3. Left ventricular diastolic function could not be evaluated.  4. Mildly dilated left  ventricular internal cavity size.  5. The left ventricle has no regional wall motion abnormalities.  6. Global right ventricle has normal systolic function.The right  ventricular size is mildly enlarged.  7. Left atrial size was mildly dilated.  8. Right atrial size was mildly dilated.  9. Mild mitral annular calcification.  10. Trivial mitral valve regurgitation. No evidence of mitral stenosis.  11. The tricuspid valve is normal in structure. Tricuspid valve  regurgitation is trivial.  12. The aortic valve was not well visualized. Aortic valve regurgitation  is not visualized. Mild to moderate aortic valve stenosis.  13. The pulmonic valve was not well visualized. Pulmonic valve  regurgitation is not visualized.  14. The inferior vena cava is dilated in size with <50% respiratory  variability, suggesting right atrial pressure of 15 mmHg.  15. Technically difficult; definity used; low normal LV systolic function;  mild LVH; mild LVE; calcified aortic valve with mild to moderate AS (mean  gradient 19 mmHg); mild LAE/RAE/RVE.   Patient Profile     74 y.o. male with PMH asthma, type II diabetes, dyslipidemia, hypertension who presented with several weeks of fatigue/worsening shortness of breath. Found to have new diagnosis of atrial fibrillation with RVR and possible community acquired pneumonia.  Assessment & Plan    New diagnosis of atrial fibrillation with RVR -extensive education done -started on apixaban for anticoagulation -CHA2DS2/VAS Stroke Risk Points= 3 -had been difficult to rate control with soft BP. Blood pressures remain borderline -tolerating metoprolol tartrate 25 mg q6 hours. -discussed TEE-CV yesterday and again today, he is amenable, planned for tomorrow. NPO at midnight -echo with low normal EF. Technically difficult images, aortic valve difficult to see but noted to be mild-moderate AS.  LE edema, crackles at bases: appears more volume overloaded today. Will  attempt diuresis with 40 IV lasix BID through tomorrow AM to try to get him euvolemic. Will need to monitor BP and BMET with this.  Possible community acquired pneumonia, Covid negative: -question if this is the driver of his afib, or if rather he has had afib for several weeks and that is actually the etiology of his symptoms. Management per primary team  Chest pain: -troponin trend not consistent with ACS -describes recently as more pleuritic in nature, now resolved -no indication for ischemic workup this admission  History of hypertension, though hypotensive this admission -holding home valsartan  Hyperlipidemia, type II diabetes -continue atorvastatin given ASCVD risk  For questions or updates, please contact [facility] Please consult [email or web address] for contact info under     Signed, Jodelle Red, MD  09/21/2019, 8:48 AM    Total time of encounter: 40 minutes total time of encounter, including 35  minutes spent in face-to-face patient care. This time includes coordination of care and counseling regarding discussion with patient, then discussion with patient and his daughter via phone. Reviewed TEE-CV, plan for management at length. Remainder of non-face-to-face time involved reviewing chart documents/testing relevant to the patient encounter and documentation in the medical record.  Jodelle Red, MD, PhD [facility]

## 2019-09-21 NOTE — Anesthesia Preprocedure Evaluation (Addendum)
Anesthesia Evaluation  Patient identified by MRN, date of birth, ID band Patient awake    Reviewed: Allergy & Precautions, NPO status , Patient's Chart, lab work & pertinent test results  Airway Mallampati: I       Dental no notable dental hx. (+) Teeth Intact   Pulmonary asthma ,    Pulmonary exam normal breath sounds clear to auscultation       Cardiovascular hypertension, Pt. on medications and Pt. on home beta blockers  Rhythm:Irregular Rate:Normal     Neuro/Psych negative neurological ROS     GI/Hepatic   Endo/Other  diabetes, Insulin Dependent  Renal/GU Renal InsufficiencyRenal disease  negative genitourinary   Musculoskeletal negative musculoskeletal ROS (+)   Abdominal (+) + obese,   Peds  Hematology  (+) Blood dyscrasia, anemia ,   Anesthesia Other Findings   Reproductive/Obstetrics                            Anesthesia Physical Anesthesia Plan  ASA: III  Anesthesia Plan: General   Post-op Pain Management:    Induction: Intravenous  PONV Risk Score and Plan:   Airway Management Planned: Natural Airway and Mask  Additional Equipment: TEE  Intra-op Plan:   Post-operative Plan:   Informed Consent: I have reviewed the patients History and Physical, chart, labs and discussed the procedure including the risks, benefits and alternatives for the proposed anesthesia with the patient or authorized representative who has indicated his/her understanding and acceptance.       Plan Discussed with: CRNA  Anesthesia Plan Comments:        Anesthesia Quick Evaluation

## 2019-09-21 NOTE — H&P (View-Only) (Signed)
Progress Note  Patient Name: Brandon Sanchez Date of Encounter: 09/21/2019  Primary Cardiologist: Buford Dresser, MD   Subjective   Reports that he has no further chest pain. Does feel more short of breath getting up to use bathroom. Daughter Vania Rea called during the visit. Reviewed TEE-CV procedure again today, risks/benefits. Asking when he can leave. I mentioned that this depends on whether the procedure is successful, how his breathing/fluid is doing, and plans for his pneumonia management. His procedure is scheduled for 8:30 tomorrow morning.  Inpatient Medications    Scheduled Meds: . apixaban  5 mg Oral BID  . atorvastatin  40 mg Oral Daily  . fluticasone furoate-vilanterol  1 puff Inhalation Daily  . furosemide  40 mg Intravenous BID  . insulin aspart  0-9 Units Subcutaneous TID WC  . insulin aspart protamine- aspart  40 Units Subcutaneous BID WC  . metoprolol tartrate  25 mg Oral Q6H  . multivitamin with minerals  1 tablet Oral Daily  . pantoprazole  40 mg Oral Daily  . sertraline  100 mg Oral Daily  . sodium chloride flush  3 mL Intravenous Once   Continuous Infusions: . azithromycin    . cefTRIAXone (ROCEPHIN)  IV     PRN Meds: acetaminophen **OR** acetaminophen, albuterol, loratadine   Vital Signs    Vitals:   09/21/19 0402 09/21/19 0413 09/21/19 0416 09/21/19 0739  BP: 98/86   99/77  Pulse: (!) 162 (!) 128  84  Resp: 20   20  Temp:   98.5 F (36.9 C) 97.9 F (36.6 C)  TempSrc:   Oral Oral  SpO2: 92%   94%  Weight:      Height:        Intake/Output Summary (Last 24 hours) at 09/21/2019 0848 Last data filed at 09/21/2019 0752 Gross per 24 hour  Intake 480.95 ml  Output 400 ml  Net 80.95 ml   Last 3 Weights 09/19/2019 07/08/2016 05/12/2015  Weight (lbs) 300 lb 300 lb 307 lb  Weight (kg) 136.079 kg 136.079 kg 139.254 kg      Telemetry    Afib, rates predominantly 130s-140s - Personally Reviewed  ECG    09/20/19 afib RVR - Personally  Reviewed  Physical Exam   GEN: No acute distress.   Neck: No JVD visible but difficult body habitus Cardiac: irregularly irregular S1 and S2, no murmurs, rubs, or gallops appreciated but may be limited by rapid rate Respiratory: Clear to auscultation bilaterally except for crackles at bilateral bases GI: Soft, nontender, non-distended  MS: bilateral 1+ pitting edema; No deformity. Neuro:  Nonfocal  Psych: Normal affect   Labs    High Sensitivity Troponin:   Recent Labs  Lab 09/19/19 2342 09/20/19 0217  TROPONINIHS 11 12      Chemistry Recent Labs  Lab 09/19/19 2342 09/21/19 0250  NA 142 138  K 3.7 4.3  CL 102 103  CO2 26 20*  GLUCOSE 132* 127*  BUN 29* 47*  CREATININE 1.21 2.42*  CALCIUM 9.8 9.5  GFRNONAA 59* 26*  GFRAA >60 30*  ANIONGAP 14 15     Hematology Recent Labs  Lab 09/19/19 2342 09/20/19 0515 09/21/19 0250  WBC 15.4* 16.6* 17.0*  RBC 4.15* 3.95* 3.97*  HGB 12.9* 12.4* 12.3*  HCT 41.0 38.8* 38.7*  MCV 98.8 98.2 97.5  MCH 31.1 31.4 31.0  MCHC 31.5 32.0 31.8  RDW 13.3 13.2 13.2  PLT 228 204 237    BNP Recent Labs  Lab  09/20/19 0515  BNP 289.1*     DDimer  Recent Labs  Lab 09/20/19 1013  DDIMER 1.04*     Radiology    CXR 09/19/19 FINDINGS: Cardiac shadow is mildly prominent but accentuated by the portable technique. Bibasilar opacities are noted right greater than left likely representing early infiltrate. No sizable effusion is seen. No bony abnormality is noted.  IMPRESSION: Mild bibasilar infiltrates right greater than left.  Cardiac Studies   Echo 09/20/19 1. Left ventricular ejection fraction, by visual estimation, is 50 to  55%. The left ventricle has normal function. There is mildly increased  left ventricular hypertrophy.  2. Definity contrast agent was given IV to delineate the left ventricular  endocardial borders.  3. Left ventricular diastolic function could not be evaluated.  4. Mildly dilated left  ventricular internal cavity size.  5. The left ventricle has no regional wall motion abnormalities.  6. Global right ventricle has normal systolic function.The right  ventricular size is mildly enlarged.  7. Left atrial size was mildly dilated.  8. Right atrial size was mildly dilated.  9. Mild mitral annular calcification.  10. Trivial mitral valve regurgitation. No evidence of mitral stenosis.  11. The tricuspid valve is normal in structure. Tricuspid valve  regurgitation is trivial.  12. The aortic valve was not well visualized. Aortic valve regurgitation  is not visualized. Mild to moderate aortic valve stenosis.  13. The pulmonic valve was not well visualized. Pulmonic valve  regurgitation is not visualized.  14. The inferior vena cava is dilated in size with <50% respiratory  variability, suggesting right atrial pressure of 15 mmHg.  15. Technically difficult; definity used; low normal LV systolic function;  mild LVH; mild LVE; calcified aortic valve with mild to moderate AS (mean  gradient 19 mmHg); mild LAE/RAE/RVE.   Patient Profile     74 y.o. male with PMH asthma, type II diabetes, dyslipidemia, hypertension who presented with several weeks of fatigue/worsening shortness of breath. Found to have new diagnosis of atrial fibrillation with RVR and possible community acquired pneumonia.  Assessment & Plan    New diagnosis of atrial fibrillation with RVR -extensive education done -started on apixaban for anticoagulation -CHA2DS2/VAS Stroke Risk Points= 3 -had been difficult to rate control with soft BP. Blood pressures remain borderline -tolerating metoprolol tartrate 25 mg q6 hours. -discussed TEE-CV yesterday and again today, he is amenable, planned for tomorrow. NPO at midnight -echo with low normal EF. Technically difficult images, aortic valve difficult to see but noted to be mild-moderate AS.  LE edema, crackles at bases: appears more volume overloaded today. Will  attempt diuresis with 40 IV lasix BID through tomorrow AM to try to get him euvolemic. Will need to monitor BP and BMET with this.  Possible community acquired pneumonia, Covid negative: -question if this is the driver of his afib, or if rather he has had afib for several weeks and that is actually the etiology of his symptoms. Management per primary team  Chest pain: -troponin trend not consistent with ACS -describes recently as more pleuritic in nature, now resolved -no indication for ischemic workup this admission  History of hypertension, though hypotensive this admission -holding home valsartan  Hyperlipidemia, type II diabetes -continue atorvastatin given ASCVD risk  For questions or updates, please contact [facility] Please consult [email or web address] for contact info under     Signed, Yamilett Anastos, MD  09/21/2019, 8:48 AM    Total time of encounter: 40 minutes total time of encounter, including 35   minutes spent in face-to-face patient care. This time includes coordination of care and counseling regarding discussion with patient, then discussion with patient and his daughter via phone. Reviewed TEE-CV, plan for management at length. Remainder of non-face-to-face time involved reviewing chart documents/testing relevant to the patient encounter and documentation in the medical record.  Jodelle Red, MD, PhD [facility]

## 2019-09-21 NOTE — Plan of Care (Signed)

## 2019-09-22 ENCOUNTER — Encounter (HOSPITAL_COMMUNITY): Payer: Self-pay | Admitting: Internal Medicine

## 2019-09-22 ENCOUNTER — Encounter (HOSPITAL_COMMUNITY)
Admission: EM | Disposition: A | Discharge status: Home / Self Care | Payer: Self-pay | Source: Home / Self Care | Attending: Internal Medicine

## 2019-09-22 ENCOUNTER — Inpatient Hospital Stay (HOSPITAL_COMMUNITY): Payer: Medicare Other | Admitting: Certified Registered Nurse Anesthetist

## 2019-09-22 ENCOUNTER — Inpatient Hospital Stay (HOSPITAL_COMMUNITY): Payer: Medicare Other

## 2019-09-22 DIAGNOSIS — I4891 Unspecified atrial fibrillation: Secondary | ICD-10-CM

## 2019-09-22 DIAGNOSIS — R0602 Shortness of breath: Secondary | ICD-10-CM

## 2019-09-22 DIAGNOSIS — I351 Nonrheumatic aortic (valve) insufficiency: Secondary | ICD-10-CM

## 2019-09-22 DIAGNOSIS — I340 Nonrheumatic mitral (valve) insufficiency: Secondary | ICD-10-CM

## 2019-09-22 DIAGNOSIS — N179 Acute kidney failure, unspecified: Secondary | ICD-10-CM

## 2019-09-22 HISTORY — PX: TEE WITHOUT CARDIOVERSION: SHX5443

## 2019-09-22 HISTORY — PX: CARDIOVERSION: SHX1299

## 2019-09-22 LAB — CBC
HCT: 36 % — ABNORMAL LOW (ref 39.0–52.0)
Hemoglobin: 11.6 g/dL — ABNORMAL LOW (ref 13.0–17.0)
MCH: 31.2 pg (ref 26.0–34.0)
MCHC: 32.2 g/dL (ref 30.0–36.0)
MCV: 96.8 fL (ref 80.0–100.0)
Platelets: 196 10*3/uL (ref 150–400)
RBC: 3.72 MIL/uL — ABNORMAL LOW (ref 4.22–5.81)
RDW: 13.2 % (ref 11.5–15.5)
WBC: 11.9 10*3/uL — ABNORMAL HIGH (ref 4.0–10.5)
nRBC: 0 % (ref 0.0–0.2)

## 2019-09-22 LAB — GLUCOSE, CAPILLARY
Glucose-Capillary: 70 mg/dL (ref 70–99)
Glucose-Capillary: 76 mg/dL (ref 70–99)
Glucose-Capillary: 72 mg/dL (ref 70–99)
Glucose-Capillary: 108 mg/dL — ABNORMAL HIGH (ref 70–99)
Glucose-Capillary: 108 mg/dL — ABNORMAL HIGH (ref 70–99)

## 2019-09-22 LAB — BASIC METABOLIC PANEL
Anion gap: 15 (ref 5–15)
BUN: 56 mg/dL — ABNORMAL HIGH (ref 8–23)
CO2: 24 mmol/L (ref 22–32)
Calcium: 8.9 mg/dL (ref 8.9–10.3)
Chloride: 104 mmol/L (ref 98–111)
Creatinine, Ser: 2.21 mg/dL — ABNORMAL HIGH (ref 0.61–1.24)
GFR calc Af Amer: 33 mL/min — ABNORMAL LOW (ref >60)
GFR calc non Af Amer: 29 mL/min — ABNORMAL LOW (ref >60)
Glucose, Bld: 81 mg/dL (ref 70–99)
Potassium: 3.6 mmol/L (ref 3.5–5.1)
Sodium: 143 mmol/L (ref 135–145)

## 2019-09-22 LAB — MAGNESIUM: Magnesium: 2.3 mg/dL (ref 1.7–2.4)

## 2019-09-22 SURGERY — ECHOCARDIOGRAM, TRANSESOPHAGEAL
Anesthesia: General

## 2019-09-22 MED ORDER — ALBUTEROL SULFATE HFA 108 (90 BASE) MCG/ACT IN AERS
INHALATION_SPRAY | RESPIRATORY_TRACT | Status: DC | PRN
  Administered 2019-09-22: 3 via RESPIRATORY_TRACT

## 2019-09-22 MED ORDER — BUTAMBEN-TETRACAINE-BENZOCAINE 2-2-14 % EX AERO
INHALATION_SPRAY | CUTANEOUS | Status: DC | PRN
  Administered 2019-09-22: 2 via TOPICAL

## 2019-09-22 MED ORDER — PHENYLEPHRINE 40 MCG/ML (10ML) SYRINGE FOR IV PUSH (FOR BLOOD PRESSURE SUPPORT)
PREFILLED_SYRINGE | INTRAVENOUS | Status: DC | PRN
  Administered 2019-09-22 (×7): 80 ug via INTRAVENOUS

## 2019-09-22 MED ORDER — INSULIN ASPART PROT & ASPART (70-30 MIX) 100 UNIT/ML ~~LOC~~ SUSP
20.0000 [IU] | Freq: Two times a day (BID) | SUBCUTANEOUS | Status: DC
  Administered 2019-09-23 – 2019-09-24 (×2): 20 [IU] via SUBCUTANEOUS
  Filled 2019-09-22 (×2): qty 10

## 2019-09-22 MED ORDER — PROPOFOL 10 MG/ML IV BOLUS
INTRAVENOUS | Status: DC | PRN
  Administered 2019-09-22: 30 mg via INTRAVENOUS
  Administered 2019-09-22: 20 mg via INTRAVENOUS

## 2019-09-22 MED ORDER — METOPROLOL SUCCINATE ER 100 MG PO TB24
200.0000 mg | ORAL_TABLET | Freq: Every day | ORAL | Status: DC
  Administered 2019-09-23 – 2019-09-24 (×2): 200 mg via ORAL
  Filled 2019-09-22 (×2): qty 2

## 2019-09-22 MED ORDER — PROPOFOL 500 MG/50ML IV EMUL
INTRAVENOUS | Status: DC | PRN
  Administered 2019-09-22: 100 ug/kg/min via INTRAVENOUS

## 2019-09-22 MED ORDER — LIDOCAINE 2% (20 MG/ML) 5 ML SYRINGE
INTRAMUSCULAR | Status: DC | PRN
  Administered 2019-09-22: 60 mg via INTRAVENOUS

## 2019-09-22 MED ORDER — EPHEDRINE SULFATE-NACL 50-0.9 MG/10ML-% IV SOSY
PREFILLED_SYRINGE | INTRAVENOUS | Status: DC | PRN
  Administered 2019-09-22 (×3): 5 mg via INTRAVENOUS

## 2019-09-22 NOTE — Anesthesia Procedure Notes (Signed)
Date/Time: 09/22/2019 9:35 AM Performed by: Audie Pinto, CRNA Pre-anesthesia Checklist: Patient identified, Emergency Drugs available, Suction available and Patient being monitored Patient Re-evaluated:Patient Re-evaluated prior to induction Oxygen Delivery Method: Nasal cannula Dental Injury: Teeth and Oropharynx as per pre-operative assessment

## 2019-09-22 NOTE — Progress Notes (Signed)
patient via wheelchair to endo for cardioversion

## 2019-09-22 NOTE — Anesthesia Postprocedure Evaluation (Signed)
Anesthesia Post Note  Patient: Brandon Sanchez  Procedure(s) Performed: TRANSESOPHAGEAL ECHOCARDIOGRAM (TEE) (N/A ) CARDIOVERSION (N/A )     Patient location during evaluation: Endoscopy Anesthesia Type: General Level of consciousness: awake and sedated Pain management: pain level controlled Vital Signs Assessment: post-procedure vital signs reviewed and stable Respiratory status: spontaneous breathing Cardiovascular status: stable Postop Assessment: no apparent nausea or vomiting Anesthetic complications: no    Last Vitals:  Vitals:   09/22/19 1040 09/22/19 1050  BP: (!) 98/59 (!) 95/59  Pulse: 66 72  Resp: 18 20  Temp:    SpO2: 97% 96%    Last Pain:  Vitals:   09/22/19 1029  TempSrc: Temporal  PainSc: 0-No pain   Pain Goal: Patients Stated Pain Goal: 4 (09/20/19 1910)                 Caren Macadam

## 2019-09-22 NOTE — Transfer of Care (Signed)
Immediate Anesthesia Transfer of Care Note  Patient: Brandon Sanchez  Procedure(s) Performed: TRANSESOPHAGEAL ECHOCARDIOGRAM (TEE) (N/A ) CARDIOVERSION (N/A )  Patient Location: Endoscopy Unit  Anesthesia Type:General  Level of Consciousness: drowsy  Airway & Oxygen Therapy: Patient Spontanous Breathing and Patient connected to nasal cannula oxygen  Post-op Assessment: Report given to RN and Post -op Vital signs reviewed and stable  Post vital signs: Reviewed  Last Vitals:  Vitals Value Taken Time  BP 93/47 09/22/19 1029  Temp    Pulse 64 09/22/19 1029  Resp 20 09/22/19 1029  SpO2 100 % 09/22/19 1029  Vitals shown include unvalidated device data.  Last Pain:  Vitals:   09/22/19 1029  TempSrc:   PainSc: 0-No pain      Patients Stated Pain Goal: 4 (09/20/19 1910)  Complications: No apparent anesthesia complications

## 2019-09-22 NOTE — Progress Notes (Signed)
Progress Note  Patient Name: Brandon Sanchez Date of Encounter: 09/22/2019  Primary Cardiologist: Buford Dresser, MD   Subjective   Seen both during and after his TEE-CV. After, resting in bed. Blood pressure has been borderline since the procedure, but he feels well. Laying nearly flat in bed and breathing is improved.   Inpatient Medications    Scheduled Meds: . apixaban  5 mg Oral BID  . atorvastatin  40 mg Oral Daily  . fluticasone furoate-vilanterol  1 puff Inhalation Daily  . insulin aspart  0-9 Units Subcutaneous TID WC  . insulin aspart protamine- aspart  20 Units Subcutaneous BID WC  . [START ON 09/23/2019] metoprolol succinate  200 mg Oral Daily  . metoprolol tartrate  25 mg Oral Q6H  . multivitamin with minerals  1 tablet Oral Daily  . pantoprazole  40 mg Oral Daily  . sertraline  100 mg Oral Daily  . sodium chloride flush  3 mL Intravenous Once   Continuous Infusions: . sodium chloride Stopped (09/22/19 0015)  . azithromycin Stopped (09/21/19 2340)  . cefTRIAXone (ROCEPHIN)  IV Stopped (09/22/19 0014)   PRN Meds: sodium chloride, acetaminophen **OR** acetaminophen, albuterol, loratadine   Vital Signs    Vitals:   09/22/19 1050 09/22/19 1110 09/22/19 1144 09/22/19 1200  BP: (!) 95/59 90/60  (!) 89/75  Pulse: 72   (!) 109  Resp: 20 19  16   Temp:  97.6 F (36.4 C)    TempSrc:  Oral    SpO2: 96% 96% 98% 99%  Weight:      Height:        Intake/Output Summary (Last 24 hours) at 09/22/2019 1308 Last data filed at 09/22/2019 1029 Gross per 24 hour  Intake 1750.62 ml  Output --  Net 1750.62 ml   Last 3 Weights 09/22/2019 09/19/2019 07/08/2016  Weight (lbs) 300 lb 300 lb 300 lb  Weight (kg) 136.079 kg 136.079 kg 136.079 kg      Telemetry    Since CV, has been sinus with PACs in the 80s - Personally Reviewed  ECG    Today post cardioversion, sinus at 70 bpm - Personally Reviewed  Physical Exam   GEN: Well nourished, well developed in no acute  distress HEENT: Normal, moist mucous membranes NECK: No JVD visible CARDIAC: regular rhythm, normal S1 and S2, no rubs or gallops. No murmur appreciated. VASCULAR: Radial and DP pulses 2+ bilaterally.  RESPIRATORY:  Clear to auscultation without wheezing or rhonchi. Basilar rales improved. ABDOMEN: Soft, non-tender, non-distended MUSCULOSKELETAL:  Ambulates independently SKIN: Warm and dry, bilateral trace LE edema NEUROLOGIC:  Alert and oriented x 3. No focal neuro deficits noted. PSYCHIATRIC:  Normal affect   Labs    High Sensitivity Troponin:   Recent Labs  Lab 09/19/19 2342 09/20/19 0217  TROPONINIHS 11 12      Chemistry Recent Labs  Lab 09/19/19 2342 09/21/19 0250 09/22/19 0248  NA 142 138 143  K 3.7 4.3 3.6  CL 102 103 104  CO2 26 20* 24  GLUCOSE 132* 127* 81  BUN 29* 47* 56*  CREATININE 1.21 2.42* 2.21*  CALCIUM 9.8 9.5 8.9  GFRNONAA 59* 26* 29*  GFRAA >60 30* 33*  ANIONGAP 14 15 15      Hematology Recent Labs  Lab 09/20/19 0515 09/21/19 0250 09/22/19 0248  WBC 16.6* 17.0* 11.9*  RBC 3.95* 3.97* 3.72*  HGB 12.4* 12.3* 11.6*  HCT 38.8* 38.7* 36.0*  MCV 98.2 97.5 96.8  MCH 31.4 31.0 31.2  MCHC 32.0 31.8 32.2  RDW 13.2 13.2 13.2  PLT 204 237 196    BNP Recent Labs  Lab 09/20/19 0515  BNP 289.1*     DDimer  Recent Labs  Lab 09/20/19 1013  DDIMER 1.04*     Radiology    CXR 09/19/19 FINDINGS: Cardiac shadow is mildly prominent but accentuated by the portable technique. Bibasilar opacities are noted right greater than left likely representing early infiltrate. No sizable effusion is seen. No bony abnormality is noted.  IMPRESSION: Mild bibasilar infiltrates right greater than left.  Cardiac Studies   Echo 09/20/19 1. Left ventricular ejection fraction, by visual estimation, is 50 to  55%. The left ventricle has normal function. There is mildly increased  left ventricular hypertrophy.  2. Definity contrast agent was given IV to  delineate the left ventricular  endocardial borders.  3. Left ventricular diastolic function could not be evaluated.  4. Mildly dilated left ventricular internal cavity size.  5. The left ventricle has no regional wall motion abnormalities.  6. Global right ventricle has normal systolic function.The right  ventricular size is mildly enlarged.  7. Left atrial size was mildly dilated.  8. Right atrial size was mildly dilated.  9. Mild mitral annular calcification.  10. Trivial mitral valve regurgitation. No evidence of mitral stenosis.  11. The tricuspid valve is normal in structure. Tricuspid valve  regurgitation is trivial.  12. The aortic valve was not well visualized. Aortic valve regurgitation  is not visualized. Mild to moderate aortic valve stenosis.  13. The pulmonic valve was not well visualized. Pulmonic valve  regurgitation is not visualized.  14. The inferior vena cava is dilated in size with <50% respiratory  variability, suggesting right atrial pressure of 15 mmHg.  15. Technically difficult; definity used; low normal LV systolic function;  mild LVH; mild LVE; calcified aortic valve with mild to moderate AS (mean  gradient 19 mmHg); mild LAE/RAE/RVE.   Patient Profile     74 y.o. male with PMH asthma, type II diabetes, dyslipidemia, hypertension who presented with several weeks of fatigue/worsening shortness of breath. Found to have new diagnosis of atrial fibrillation with RVR and possible community acquired pneumonia.  Assessment & Plan    New diagnosis of atrial fibrillation with RVR -extensive education done -started on apixaban for anticoagulation this admission, tolerating -CHA2DS2/VAS Stroke Risk Points= 3 -s/p TEE-CV today. Now in sinus rhythm with occasional PACs at 80 bpm -tolerating metoprolol tartrate 25 mg q6 hours. Will consolidate to 200 mg metoprolol succinate daily starting tomorrow AM -TTE with low normal EF. Technically difficult images, aortic  valve difficult to see but noted to be mild-moderate AS.  LE edema, crackles at bases: appears somewhat improved today. Cr bumped yesterday but improved today. Will hold diuretics today given improved volume status, borderline hypotension. Monitor BMET. -no clear I/O trend based on measured urine, daily weights.  Possible community acquired pneumonia, Covid negative: -question if this is the driver of his afib, or if rather he has had afib for several weeks and that is actually the etiology of his symptoms. Management per primary team  Chest pain: has not recurred -troponin trend not consistent with ACS -describes recently as more pleuritic in nature, now resolved -no indication for ischemic workup this admission  History of hypertension, though hypotensive this admission -holding home valsartan, suspect with metoprolol he will not have room for valsartan at discharge.  Hyperlipidemia, type II diabetes -continue atorvastatin given ASCVD risk  If he ambulates well in  the AM, without hypotension or dizziness, and tolerates metoprolol succinate, my hope is that he could potentially be ready for discharge from a cardiac standpoint as early as tomorrow. Contingent on volume status, symptoms, and rhythm control. Would like to see kidneys continuing to downtrend as well.  For questions or updates, please contact [facility] Please consult [email or web address] for contact info under     Signed, Jodelle Red, MD  09/22/2019, 1:08 PM

## 2019-09-22 NOTE — Progress Notes (Signed)
PROGRESS NOTE    Brandon Sanchez  LPF:[id number] DOB: [birth year] DOA: 09/19/2019 PCP: Deland Pretty, MD     Brief Narrative:  Brandon Cole Ingramis a 74 y.o.malewith medical history significant ofallergic rhinitis, asthma, insulin-dependent type 2 diabetes, hypertension, hyperlipidemia presenting to the ED with complaints of shortness of breath and chest pain. EMS found patient to be in A. fib with RVR with rate 150-180. He was given IV Cardizem bolus by EMS. Patient states he has chronic shortness of breath due to his asthma but for the past 1 month it has been much worse. A. fib with RVR with rate up to 150s in the ED. Chest x-ray showing mild bibasilar infiltrates, right greater than left. Patient received Cardizem bolus and was started on infusion. Received Lovenox 1 mg/kg for anticoagulation. Received ceftriaxone and azithromycin for pneumonia. Due to hypotension with Cardizem gtt, it was stopped and cardiology consulted.  New events last 24 hours / Subjective: Blood sugar 70 this morning.  Denies any complaints of worsening shortness of breath, cough or chest pain. Patient awaiting cardioversion procedure this morning.   Assessment & Plan:   Principal Problem:   Atrial fibrillation with rapid ventricular response (HCC) Active Problems:   Diabetes mellitus (HCC)   HLD (hyperlipidemia)   HTN (hypertension)   CAP (community acquired pneumonia)    New onset A. fib with RVR -TSH 1.103 -Echocardiogram EF 53-29%, LV diastolic function could not be evaluated, no regional wall motion abnormalities  -Started on Eliquis  -Currently on metoprolol -Cardiology following; planning for TEE-DCCV 2/3  -Giving IV lasix due to fluid overload, monitor BMP   Sepsis secondary to community-acquired pneumonia -Sepsis present on admission with leukocytosis and tachycardia -COVID-19 negative, influenza negative -Blood cultures negative to date  -Chest x-ray bibasilar infiltrates R >  L -Ceftriaxone and azithromycin -WBC improving  Insulin-dependent type 2 diabetes, well controlled -Hemoglobin A1c 6.5 -Continue home NovoLog 70/30 as well as sliding scale insulin -Decrease dose of NovoLog 70/30 due to hypoglycemia   Essential hypertension -Hold home valsartan due to hypotension, AKI  -Continue metoprolol, monitor BP closely  Hyperlipidemia -Continue Lipitor  Depression -Continue zoloft   AKI  -Cr 1.21 --> 2.42 --> 2.21 -Renal ultrasound unremarkable -Check FeNa  -Monitor UOP    DVT prophylaxis: Eliquis Code Status: Full Family Communication: None at bedside Disposition Plan: Patient is from home prior to admission. Currently in-hospital treatment needed due to A Fib RVR. Barrier(s) to discharge include cardioversion planned 2/3 and suspect patient will discharge to home once cleared by cardiology.    Consultants:   Cardiology   Procedures:   None   Antimicrobials:  Anti-infectives (From admission, onward)   Start     Dose/Rate Route Frequency Ordered Stop   09/21/19 2200  [MAR Hold]  azithromycin (ZITHROMAX) 500 mg in sodium chloride 0.9 % 250 mL IVPB     (MAR Hold since Wed 09/22/2019 at 0838.Hold Reason: Transfer to a Procedural area.)   500 mg 250 mL/hr over 60 Minutes Intravenous Daily at bedtime 09/20/19 0325     09/21/19 2200  [MAR Hold]  cefTRIAXone (ROCEPHIN) 1 g in sodium chloride 0.9 % 100 mL IVPB     (MAR Hold since Wed 09/22/2019 at 0838.Hold Reason: Transfer to a Procedural area.)   1 g 200 mL/hr over 30 Minutes Intravenous Daily at bedtime 09/20/19 0325     09/20/19 0030  cefTRIAXone (ROCEPHIN) 1 g in sodium chloride 0.9 % 100 mL IVPB     1 g 200  mL/hr over 30 Minutes Intravenous  Once 09/20/19 0019 09/20/19 0257   09/20/19 0030  azithromycin (ZITHROMAX) 500 mg in sodium chloride 0.9 % 250 mL IVPB     500 mg 250 mL/hr over 60 Minutes Intravenous  Once 09/20/19 0019 09/20/19 0345       Objective: Vitals:   09/22/19 0005  09/22/19 0332 09/22/19 0738 09/22/19 0842  BP: 110/77 114/74 103/76 (!) 113/98  Pulse:  97 94   Resp: 17 17 20 18   Temp: 97.7 F (36.5 C) 97.6 F (36.4 C) 97.9 F (36.6 C) 98 F (36.7 C)  TempSrc: Oral Oral Oral   SpO2:  94%  98%  Weight:    136.1 kg  Height:    6\' 3"  (1.905 m)    Intake/Output Summary (Last 24 hours) at 09/22/2019 0955 Last data filed at 09/22/2019 0300 Gross per 24 hour  Intake 1270.62 ml  Output --  Net 1270.62 ml   Filed Weights   09/19/19 2339 09/22/19 0842  Weight: 136.1 kg 136.1 kg    Examination: General exam: Appears calm and comfortable  Respiratory system: Clear to auscultation. Respiratory effort normal. Cardiovascular system: S1 & S2 heard, Irreg rhythm, rate 100s. +bilateral pedal edema. Gastrointestinal system: Abdomen is nondistended, soft and nontender. Normal bowel sounds heard. Central nervous system: Alert and oriented. Non focal exam. Speech clear  Extremities: Symmetric in appearance bilaterally  Skin: No rashes, lesions or ulcers on exposed skin  Psychiatry: Judgement and insight appear stable. Mood & affect appropriate.     Data Reviewed: I have personally reviewed following labs and imaging studies  CBC: Recent Labs  Lab 09/19/19 2342 09/20/19 0515 09/21/19 0250 09/22/19 0248  WBC 15.4* 16.6* 17.0* 11.9*  HGB 12.9* 12.4* 12.3* 11.6*  HCT 41.0 38.8* 38.7* 36.0*  MCV 98.8 98.2 97.5 96.8  PLT 228 204 237 196   Basic Metabolic Panel: Recent Labs  Lab 09/19/19 2342 09/20/19 0515 09/21/19 0250 09/22/19 0248  NA 142  --  138 143  K 3.7  --  4.3 3.6  CL 102  --  103 104  CO2 26  --  20* 24  GLUCOSE 132*  --  127* 81  BUN 29*  --  47* 56*  CREATININE 1.21  --  2.42* 2.21*  CALCIUM 9.8  --  9.5 8.9  MG  --  1.6* 2.4 2.3   GFR: Estimated Creatinine Clearance: 44.9 mL/min (A) (by C-G formula based on SCr of 2.21 mg/dL (H)). Liver Function Tests: No results for input(s): AST, ALT, ALKPHOS, BILITOT, PROT, ALBUMIN in the  last 168 hours. No results for input(s): LIPASE, AMYLASE in the last 168 hours. No results for input(s): AMMONIA in the last 168 hours. Coagulation Profile: No results for input(s): INR, PROTIME in the last 168 hours. Cardiac Enzymes: No results for input(s): CKTOTAL, CKMB, CKMBINDEX, TROPONINI in the last 168 hours. BNP (last 3 results) No results for input(s): PROBNP in the last 8760 hours. HbA1C: Recent Labs    09/20/19 0515  HGBA1C 6.5*   CBG: Recent Labs  Lab 09/21/19 1122 09/21/19 1626 09/21/19 2122 09/22/19 0627 09/22/19 0837  GLUCAP 113* 115* 102* 70 76   Lipid Profile: No results for input(s): CHOL, HDL, LDLCALC, TRIG, CHOLHDL, LDLDIRECT in the last 72 hours. Thyroid Function Tests: Recent Labs    09/20/19 0515  TSH 1.103   Anemia Panel: No results for input(s): VITAMINB12, FOLATE, FERRITIN, TIBC, IRON, RETICCTPCT in the last 72 hours. Sepsis Labs: No results for  input(s): PROCALCITON, LATICACIDVEN in the last 168 hours.  Recent Results (from the past 240 hour(s))  Respiratory Panel by RT PCR (Flu A&B, Covid) - Nasopharyngeal Swab     Status: None   Collection Time: 09/20/19 12:35 AM   Specimen: Nasopharyngeal Swab  Result Value Ref Range Status   SARS Coronavirus 2 by RT PCR NEGATIVE NEGATIVE Final    Comment: (NOTE) SARS-CoV-2 target nucleic acids are NOT DETECTED. The SARS-CoV-2 RNA is generally detectable in upper respiratoy specimens during the acute phase of infection. The lowest concentration of SARS-CoV-2 viral copies this assay can detect is 131 copies/mL. A negative result does not preclude SARS-Cov-2 infection and should not be used as the sole basis for treatment or other patient management decisions. A negative result may occur with  improper specimen collection/handling, submission of specimen other than nasopharyngeal swab, presence of viral mutation(s) within the areas targeted by this assay, and inadequate number of viral copies (<131  copies/mL). A negative result must be combined with clinical observations, patient history, and epidemiological information. The expected result is Negative. Fact Sheet for Patients:  https://www.moore.com/ Fact Sheet for Healthcare Providers:  https://www.young.biz/ This test is not yet ap proved or cleared by the [place] FDA and  has been authorized for detection and/or diagnosis of SARS-CoV-2 by FDA under an Emergency Use Authorization (EUA). This EUA will remain  in effect (meaning this test can be used) for the duration of the COVID-19 declaration under Section 564(b)(1) of the Act, 21 U.S.C. section 360bbb-3(b)(1), unless the authorization is terminated or revoked sooner.    Influenza A by PCR NEGATIVE NEGATIVE Final   Influenza B by PCR NEGATIVE NEGATIVE Final    Comment: (NOTE) The Xpert Xpress SARS-CoV-2/FLU/RSV assay is intended as an aid in  the diagnosis of influenza from Nasopharyngeal swab specimens and  should not be used as a sole basis for treatment. Nasal washings and  aspirates are unacceptable for Xpert Xpress SARS-CoV-2/FLU/RSV  testing. Fact Sheet for Patients: https://www.moore.com/ Fact Sheet for Healthcare Providers: https://www.young.biz/ This test is not yet approved or cleared by the [place] FDA and  has been authorized for detection and/or diagnosis of SARS-CoV-2 by  FDA under an Emergency Use Authorization (EUA). This EUA will remain  in effect (meaning this test can be used) for the duration of the  Covid-19 declaration under Section 564(b)(1) of the Act, 21  U.S.C. section 360bbb-3(b)(1), unless the authorization is  terminated or revoked. Performed at [facility], [address], [place], [place] [address]   Blood culture (routine x 2)     Status: None (Preliminary result)   Collection Time: 09/20/19  1:25 AM   Specimen: BLOOD RIGHT HAND  Result Value  Ref Range Status   Specimen Description BLOOD RIGHT HAND  Final   Special Requests   Final    BOTTLES DRAWN AEROBIC AND ANAEROBIC Blood Culture adequate volume   Culture NO GROWTH 1 DAY  Final   Report Status PENDING  Incomplete  Blood culture (routine x 2)     Status: None (Preliminary result)   Collection Time: 09/20/19  1:30 AM   Specimen: BLOOD LEFT WRIST  Result Value Ref Range Status   Specimen Description BLOOD LEFT WRIST  Final   Special Requests   Final    BOTTLES DRAWN AEROBIC AND ANAEROBIC Blood Culture results may not be optimal due to an excessive volume of blood received in culture bottles   Culture NO GROWTH 1 DAY  Final  Report Status PENDING  Incomplete  MRSA PCR Screening     Status: None   Collection Time: 09/21/19  1:16 AM   Specimen: Nasal Mucosa; Nasopharyngeal  Result Value Ref Range Status   MRSA by PCR NEGATIVE NEGATIVE Final    Comment:        The GeneXpert MRSA Assay (FDA approved for NASAL specimens only), is one component of a comprehensive MRSA colonization surveillance program. It is not intended to diagnose MRSA infection nor to guide or monitor treatment for MRSA infections. Performed at [facility], [address], [place], [place] [address]       Radiology Studies: US RENAL  Result Date: 09/21/2019 CLINICAL DATA:  Acute renal failure. EXAM: RENAL / URINARY TRACT ULTRASOUND COMPLETE COMPARISON:  No prior. FINDINGS: Right Kidney: Renal measurements: 12.7 x 6.2 x 6.8 cm = volume: 281.1 mL . Echogenicity within normal limits. No mass or hydronephrosis visualized. Left Kidney: Renal measurements: 14.4 x 6.1 x 4.5 cm = volume: 205.8 mL. Echogenicity within normal limits. No mass or hydronephrosis visualized. Bladder: Appears normal for degree of bladder distention. Other: None. IMPRESSION: No acute or focal abnormality identified. Electronically Signed   By: Maisie Fus  Register   On: 09/21/2019 10:40   ECHOCARDIOGRAM COMPLETE  Result Date:  09/20/2019   ECHOCARDIOGRAM REPORT   Patient Name:   Brandon Sanchez Date of Exam: 09/20/2019 Medical Rec #:  [id number]        Height:       75.0 in Accession #:    [id number]       Weight:       300.0 lb Date of Birth:  Sep 15, 1945        BSA:          2.61 m Patient Age:    73 years         BP:           93/65 mmHg Patient Gender: M                HR:           127 bpm. Exam Location:  Inpatient Procedure: 2D Echo and Intracardiac Opacification Agent Indications:    Atrial Fibrillation 427.31 / I48.91  History:        Patient has no prior history of Echocardiogram examinations.                 Risk Factors:Hypertension, Dyslipidemia, Diabetes and Obesity.  Sonographer:    Leeroy Bock Turrentine Referring Phys: [id number] John Giovanni  Sonographer Comments: Technically difficult study due to poor echo windows. Image acquisition challenging due to patient body habitus. IMPRESSIONS  1. Left ventricular ejection fraction, by visual estimation, is 50 to 55%. The left ventricle has normal function. There is mildly increased left ventricular hypertrophy.  2. Definity contrast agent was given IV to delineate the left ventricular endocardial borders.  3. Left ventricular diastolic function could not be evaluated.  4. Mildly dilated left ventricular internal cavity size.  5. The left ventricle has no regional wall motion abnormalities.  6. Global right ventricle has normal systolic function.The right ventricular size is mildly enlarged.  7. Left atrial size was mildly dilated.  8. Right atrial size was mildly dilated.  9. Mild mitral annular calcification. 10. Trivial mitral valve regurgitation. No evidence of mitral stenosis. 11. The tricuspid valve is normal in structure. Tricuspid valve regurgitation is trivial. 12. The aortic valve was not well visualized. Aortic valve regurgitation is not visualized.  Mild to moderate aortic valve stenosis. 13. The pulmonic valve was not well visualized. Pulmonic valve regurgitation is not  visualized. 14. The inferior vena cava is dilated in size with <50% respiratory variability, suggesting right atrial pressure of 15 mmHg. 15. Technically difficult; definity used; low normal LV systolic function; mild LVH; mild LVE; calcified aortic valve with mild to moderate AS (mean gradient 19 mmHg); mild LAE/RAE/RVE. FINDINGS  Left Ventricle: Left ventricular ejection fraction, by visual estimation, is 50 to 55%. The left ventricle has normal function. Definity contrast agent was given IV to delineate the left ventricular endocardial borders. The left ventricle has no regional wall motion abnormalities. The left ventricular internal cavity size was mildly dilated left ventricle. There is mildly increased left ventricular hypertrophy. The left ventricular diastology could not be evaluated due to atrial fibrillation. Left ventricular diastolic function could not be evaluated. Normal left atrial pressure. Right Ventricle: The right ventricular size is mildly enlarged.Global RV systolic function is has normal systolic function. Left Atrium: Left atrial size was mildly dilated. Right Atrium: Right atrial size was mildly dilated Pericardium: There is no evidence of pericardial effusion. Mitral Valve: The mitral valve is normal in structure. Mild mitral annular calcification. Trivial mitral valve regurgitation. No evidence of mitral valve stenosis by observation. Tricuspid Valve: The tricuspid valve is normal in structure. Tricuspid valve regurgitation is trivial. Aortic Valve: The aortic valve was not well visualized. Aortic valve regurgitation is not visualized. Mild to moderate aortic stenosis is present. Aortic valve mean gradient measures 18.8 mmHg. Aortic valve peak gradient measures 29.9 mmHg. Aortic valve area, by VTI measures 0.99 cm. Pulmonic Valve: The pulmonic valve was not well visualized. Pulmonic valve regurgitation is not visualized. Pulmonic regurgitation is not visualized. Aorta: The aortic root is  normal in size and structure. Venous: The inferior vena cava is dilated in size with less than 50% respiratory variability, suggesting right atrial pressure of 15 mmHg.  Additional Comments: Technically difficult; definity used; low normal LV systolic function; mild LVH; mild LVE; calcified aortic valve with mild to moderate AS (mean gradient 19 mmHg); mild LAE/RAE/RVE.  LEFT VENTRICLE PLAX 2D LVIDd:         5.55 cm LVIDs:         4.30 cm LV PW:         1.15 cm LV IVS:        1.15 cm LVOT diam:     2.10 cm LV SV:         67 ml LV SV Index:   24.70 LVOT Area:     3.46 cm  LEFT ATRIUM             Index LA diam:        5.30 cm 2.03 cm/m LA Vol (A2C):   79.3 ml 30.42 ml/m LA Vol (A4C):   66.5 ml 25.51 ml/m LA Biplane Vol: 73.0 ml 28.00 ml/m  AORTIC VALVE AV Area (Vmax):    0.99 cm AV Area (Vmean):   0.91 cm AV Area (VTI):     0.99 cm AV Vmax:           273.20 cm/s AV Vmean:          204.600 cm/s AV VTI:            0.527 m AV Peak Grad:      29.9 mmHg AV Mean Grad:      18.8 mmHg LVOT Vmax:         78.30 cm/s  LVOT Vmean:        53.800 cm/s LVOT VTI:          0.151 m LVOT/AV VTI ratio: 0.29  AORTA Ao Root diam: 2.90 cm MITRAL VALVE MV Area (PHT): 3.17 cm             SHUNTS MV PHT:        69.31 msec           Systemic VTI:  0.15 m MV Decel Time: 239 msec             Systemic Diam: 2.10 cm MV E velocity: 127.67 cm/s 103 cm/s  Olga MillersBrian Crenshaw MD Electronically signed by Olga MillersBrian Crenshaw MD Signature Date/Time: 09/20/2019/3:56:17 PM    Final       Scheduled Meds: . [MAR Hold] apixaban  5 mg Oral BID  . [MAR Hold] atorvastatin  40 mg Oral Daily  . [MAR Hold] fluticasone furoate-vilanterol  1 puff Inhalation Daily  . [MAR Hold] furosemide  40 mg Intravenous BID  . [MAR Hold] insulin aspart  0-9 Units Subcutaneous TID WC  . [MAR Hold] insulin aspart protamine- aspart  40 Units Subcutaneous BID WC  . [MAR Hold] metoprolol tartrate  25 mg Oral Q6H  . [MAR Hold] multivitamin with minerals  1 tablet Oral Daily  .  [MAR Hold] pantoprazole  40 mg Oral Daily  . [MAR Hold] sertraline  100 mg Oral Daily  . [MAR Hold] sodium chloride flush  3 mL Intravenous Once   Continuous Infusions: . sodium chloride 20 mL/hr at 09/22/19 0846  . [MAR Hold] sodium chloride Stopped (09/22/19 0015)  . [MAR Hold] azithromycin Stopped (09/21/19 2340)  . [MAR Hold] cefTRIAXone (ROCEPHIN)  IV Stopped (09/22/19 0014)     LOS: 2 days      Time spent: 35 minutes   Noralee StainJennifer Phenix Grein, DO [facility] 09/22/2019, 9:55 AM   Available via Epic secure chat 7am-7pm After these hours, please refer to coverage provider listed on [email or web address]

## 2019-09-22 NOTE — Progress Notes (Signed)
  Echocardiogram Echocardiogram Transesophageal has been performed.  Delcie Roch 09/22/2019, 10:39 AM

## 2019-09-22 NOTE — CV Procedure (Signed)
   TRANSESOPHAGEAL ECHOCARDIOGRAM GUIDED DIRECT CURRENT CARDIOVERSION  NAME:  Brandon Sanchez   MRN: [id number] DOB:  Sep 08, 1945   ADMIT DATE: 09/19/2019  INDICATIONS: Symptomatic atrial fibrillation  PROCEDURE:   Informed consent was obtained prior to the procedure. The risks, benefits and alternatives for the procedure were discussed and the patient comprehended these risks.  Risks include, but are not limited to, cough, sore throat, vomiting, nausea, somnolence, esophageal and stomach trauma or perforation, bleeding, low blood pressure, aspiration, pneumonia, infection, trauma to the teeth and death.    After a procedural time-out, the oropharynx was anesthetized and the patient was sedated by the anesthesia service. The transesophageal probe was inserted in the esophagus and stomach without difficulty and multiple views were obtained. Anesthesia was monitored by Dr. Arby Barrette and Koren Bound, CRNA.  COMPLICATIONS:    Complications: No complications Patient tolerated procedure well.  FINDINGS:  LEFT VENTRICLE: EF = 50-55% No regional wall motion abnormalities.  RIGHT VENTRICLE: Normal size and function.   LEFT ATRIUM: No thrombus/mass.  LEFT ATRIAL APPENDAGE: No thrombus/mass.   RIGHT ATRIUM: No thrombus/mass.  AORTIC VALVE:  Moderately calcified.  Mild regurgitation. No vegetation.  MITRAL VALVE:    Normal structure. Mild regurgitation. No vegetation.  TRICUSPID VALVE: Normal structure. Trivial regurgitation. No vegetation.  PULMONIC VALVE: Grossly normal structure. No regurgitation. No apparent vegetation.  INTERATRIAL SEPTUM: There is a PFO by color Doppler.  PERICARDIUM: No effusion noted.  AORTA: mild ascending dilatation   CARDIOVERSION:     Indications:  Symptomatic Atrial Fibrillation  Procedure Details:  Once the TEE was complete, the patient had the defibrillator pads placed in the anterior and posterior position. Once an appropriate level of  sedation was confirmed, the patient was cardioverted x 1 with 200J of biphasic synchronized energy.  Following cardioversion, briefly went asystolic (approximately 5 seconds) with subsequent return of sinus rhythm before transcutaneous pacing could be started.  The patient converted to NSR with rate in 60s.  There were no apparent complications.  The patient had normal neuro status and respiratory status post procedure with vitals stable as recorded elsewhere.  Adequate airway was maintained throughout and vital signs monitored per protocol.  Epifanio Lesches MD [facility]  [address], [address] [place], [place] [address] [phone]   5:30 PM

## 2019-09-22 NOTE — Interval H&P Note (Signed)
History and Physical Interval Note:  09/22/2019 10:47 AM  Brandon Sanchez  has presented today for surgery, with the diagnosis of A-FIB.  The various methods of treatment have been discussed with the patient and family. After consideration of risks, benefits and other options for treatment, the patient has consented to  Procedure(s): TRANSESOPHAGEAL ECHOCARDIOGRAM (TEE) (N/A) CARDIOVERSION (N/A) as a surgical intervention.  The patient's history has been reviewed, patient examined, no change in status, stable for surgery.  I have reviewed the patient's chart and labs.  Questions were answered to the patient's satisfaction.     [name]

## 2019-09-23 ENCOUNTER — Telehealth: Payer: Self-pay | Admitting: Cardiology

## 2019-09-23 DIAGNOSIS — E1169 Type 2 diabetes mellitus with other specified complication: Secondary | ICD-10-CM

## 2019-09-23 DIAGNOSIS — Z794 Long term (current) use of insulin: Secondary | ICD-10-CM

## 2019-09-23 DIAGNOSIS — I10 Essential (primary) hypertension: Secondary | ICD-10-CM

## 2019-09-23 DIAGNOSIS — N289 Disorder of kidney and ureter, unspecified: Secondary | ICD-10-CM

## 2019-09-23 LAB — BASIC METABOLIC PANEL
Anion gap: 14 (ref 5–15)
BUN: 54 mg/dL — ABNORMAL HIGH (ref 8–23)
CO2: 26 mmol/L (ref 22–32)
Calcium: 9.2 mg/dL (ref 8.9–10.3)
Chloride: 104 mmol/L (ref 98–111)
Creatinine, Ser: 2.04 mg/dL — ABNORMAL HIGH (ref 0.61–1.24)
GFR calc Af Amer: 37 mL/min — ABNORMAL LOW (ref >60)
GFR calc non Af Amer: 32 mL/min — ABNORMAL LOW (ref >60)
Glucose, Bld: 142 mg/dL — ABNORMAL HIGH (ref 70–99)
Potassium: 4.1 mmol/L (ref 3.5–5.1)
Sodium: 144 mmol/L (ref 135–145)

## 2019-09-23 LAB — GLUCOSE, CAPILLARY
Glucose-Capillary: 117 mg/dL — ABNORMAL HIGH (ref 70–99)
Glucose-Capillary: 145 mg/dL — ABNORMAL HIGH (ref 70–99)
Glucose-Capillary: 138 mg/dL — ABNORMAL HIGH (ref 70–99)
Glucose-Capillary: 132 mg/dL — ABNORMAL HIGH (ref 70–99)

## 2019-09-23 LAB — CBC
HCT: 36.6 % — ABNORMAL LOW (ref 39.0–52.0)
Hemoglobin: 11.5 g/dL — ABNORMAL LOW (ref 13.0–17.0)
MCH: 31.2 pg (ref 26.0–34.0)
MCHC: 31.4 g/dL (ref 30.0–36.0)
MCV: 99.2 fL (ref 80.0–100.0)
Platelets: 208 10*3/uL (ref 150–400)
RBC: 3.69 MIL/uL — ABNORMAL LOW (ref 4.22–5.81)
RDW: 13.2 % (ref 11.5–15.5)
WBC: 9.3 10*3/uL (ref 4.0–10.5)
nRBC: 0 % (ref 0.0–0.2)

## 2019-09-23 MED ORDER — ALBUMIN HUMAN 25 % IV SOLN
12.5000 g | Freq: Two times a day (BID) | INTRAVENOUS | Status: AC
  Administered 2019-09-23 (×2): 12.5 g via INTRAVENOUS
  Filled 2019-09-23 (×2): qty 50

## 2019-09-23 MED ORDER — SODIUM CHLORIDE 0.9 % IV SOLN: INTRAVENOUS | Status: DC

## 2019-09-23 NOTE — Consult Note (Signed)
[facility]  HISTORY AND PHYSICAL  Brandon Sanchez is an 74 y.o. male.    Chief Complaint: Dyspnea and CP  HPI: Pt is a 21M with a PMH sig for HTN, HLD, DM who came to the ED for progressive SOB and CP. Noted to be in Afib with RVR and was given dilt bolus.  Was given Lovenox for anticoagulation, thought to also have pneumonia and was given CTX and azithromycin.  Was on lasix and valsartan as OP.  Underwent TEE/ cardioversion 2/3.  Is on Eliquis.  No contrasted studies.  No NSAIDs,  Renal US without hydro or stones.  In this setting we are asked to see.  Cr 1.2 on admission 1/31, has gone up to 2.4-->2.2-->2.07 today.  Getting albumin and IVFs.    Pt denies CP/ SOB now.    PMH: Past Medical History:  Diagnosis Date  . Allergic rhinitis, cause unspecified   . Asthma   . Other and unspecified hyperlipidemia   . Type II or unspecified type diabetes mellitus without mention of complication, not stated as uncontrolled   . Unspecified essential hypertension    PSH: Past Surgical History:  Procedure Laterality Date  . CARDIOVERSION N/A 09/22/2019   Procedure: CARDIOVERSION;  Surgeon: [name], MD;  Location: [facility] ENDOSCOPY;  Service: Endoscopy;  Laterality: N/A;  . TEE WITHOUT CARDIOVERSION N/A 09/22/2019   Procedure: TRANSESOPHAGEAL ECHOCARDIOGRAM (TEE);  Surgeon: [name], MD;  Location: [facility] ENDOSCOPY;  Service: Endoscopy;  Laterality: N/A;     Past Medical History:  Diagnosis Date  . Allergic rhinitis, cause unspecified   . Asthma   . Other and unspecified hyperlipidemia   . Type II or unspecified type diabetes mellitus without mention of complication, not stated as uncontrolled   . Unspecified essential hypertension     Medications:   Scheduled: . apixaban  5 mg Oral BID  . atorvastatin  40 mg Oral Daily  . fluticasone furoate-vilanterol  1 puff Inhalation Daily  . insulin aspart  0-9 Units Subcutaneous TID WC  . insulin aspart  protamine- aspart  20 Units Subcutaneous BID WC  . metoprolol succinate  200 mg Oral Daily  . multivitamin with minerals  1 tablet Oral Daily  . pantoprazole  40 mg Oral Daily  . sertraline  100 mg Oral Daily  . sodium chloride flush  3 mL Intravenous Once    Medications Prior to Admission  Medication Sig Dispense Refill  . acetaminophen (TYLENOL) 500 MG tablet Take 500 mg by mouth See admin instructions. 1500mg  in the morning before lunch, then 1500mg  at bedtime    . albuterol (PROAIR HFA) 108 (90 BASE) MCG/ACT inhaler 2 puffs every 4 hours as needed only  if your can't catch your breath 1 Inhaler 1  . aspirin 81 MG tablet Take 81 mg by mouth daily.      Marland Kitchen atorvastatin (LIPITOR) 40 MG tablet Take 40 mg by mouth daily.      . cetirizine-pseudoephedrine (ZYRTEC-D) 5-120 MG per tablet Take 1 tablet by mouth daily as needed for allergies.     . Fluticasone-Salmeterol (ADVAIR) 100-50 MCG/DOSE AEPB Inhale 1 puff into the lungs daily.    . furosemide (LASIX) 40 MG tablet 40 mg 3 (three) times daily. 2 tablets in AM and 1 tablet midday.    . Glucosamine-Chondroit-Vit C-Mn (GLUCOSAMINE CHONDR 1500 COMPLX) CAPS Take 1 capsule by mouth 2 (two) times daily.    . insulin aspart protamine- aspart (NOVOLOG MIX 70/30) (70-30) 100  UNIT/ML injection Inject 40 Units into the skin 2 (two) times daily at 8 am and 10 pm.    . Multiple Vitamin (MULTIVITAMIN WITH MINERALS) TABS tablet Take 1 tablet by mouth daily.    Marland Kitchen omeprazole (PRILOSEC) 40 MG capsule Take 40 mg by mouth daily as needed (heart burn).     . sertraline (ZOLOFT) 100 MG tablet Take 100 mg by mouth daily.    . valsartan (DIOVAN) 80 MG tablet Take 80 mg by mouth daily.      ALLERGIES:  No Known Allergies  FAM HX: Family History  Problem Relation Age of Onset  . Colon cancer Father   . Heart disease Paternal Grandfather     Social History:   reports that he has never smoked. He has never used smokeless tobacco. He reports current alcohol  use of about 3.0 standard drinks of alcohol per week. No history on file for drug.  ROS: ROS: all other systems reviewed and are negative except as per HPI  Blood pressure (!) 112/59, pulse 74, temperature 99 F (37.2 C), temperature source Oral, resp. rate (!) 21, height 6\' 3"  (1.905 m), weight 136.1 kg, SpO2 97 %. PHYSICAL EXAM: Physical Exam  GEN: NAD, sitting in bed HEENT EOMI PERRL NECK + HJR PULM normal WOB, clear bilaterally no c/w/r CV RRR no m/r/g ABD soft nontender obese NABS EXT 1+ LE edema NEURO AAO x 3 nonfocal SKIN warm and dry   Results for orders placed or performed during the hospital encounter of 09/19/19 (from the past 48 hour(s))  Glucose, capillary     Status: Abnormal   Collection Time: 09/21/19  9:22 PM  Result Value Ref Range   Glucose-Capillary 102 (H) 70 - 99 mg/dL   Comment 1 Notify RN    Comment 2 Document in Chart   CBC     Status: Abnormal   Collection Time: 09/22/19  2:48 AM  Result Value Ref Range   WBC 11.9 (H) 4.0 - 10.5 K/uL   RBC 3.72 (L) 4.22 - 5.81 MIL/uL   Hemoglobin 11.6 (L) 13.0 - 17.0 g/dL   HCT 36.0 (L) 39.0 - 52.0 %   MCV 96.8 80.0 - 100.0 fL   MCH 31.2 26.0 - 34.0 pg   MCHC 32.2 30.0 - 36.0 g/dL   RDW 13.2 11.5 - 15.5 %   Platelets 196 150 - 400 K/uL   nRBC 0.0 0.0 - 0.2 %    Comment: Performed at [facility], [address] [place], [place] [address]  Basic metabolic panel     Status: Abnormal   Collection Time: 09/22/19  2:48 AM  Result Value Ref Range   Sodium 143 135 - 145 mmol/L   Potassium 3.6 3.5 - 5.1 mmol/L   Chloride 104 98 - 111 mmol/L   CO2 24 22 - 32 mmol/L   Glucose, Bld 81 70 - 99 mg/dL   BUN 56 (H) 8 - 23 mg/dL   Creatinine, Ser 2.21 (H) 0.61 - 1.24 mg/dL   Calcium 8.9 8.9 - 10.3 mg/dL   GFR calc non Af Amer 29 (L) >60 mL/min   GFR calc Af Amer 33 (L) >60 mL/min   Anion gap 15 5 - 15    Comment: Performed at [facility], [place] [address], [place], [place] [address]  Magnesium     Status:  None   Collection Time: 09/22/19  2:48 AM  Result Value Ref Range   Magnesium 2.3 1.7 -  2.4 mg/dL    Comment: Performed at [facility], [address] [place], [place] [address]  Glucose, capillary     Status: None   Collection Time: 09/22/19  6:27 AM  Result Value Ref Range   Glucose-Capillary 70 70 - 99 mg/dL   Comment 1 Notify RN    Comment 2 Document in Chart   Glucose, capillary     Status: None   Collection Time: 09/22/19  8:37 AM  Result Value Ref Range   Glucose-Capillary 76 70 - 99 mg/dL  Glucose, capillary     Status: None   Collection Time: 09/22/19 11:13 AM  Result Value Ref Range   Glucose-Capillary 72 70 - 99 mg/dL   Comment 1 Notify RN    Comment 2 Document in Chart   Glucose, capillary     Status: Abnormal   Collection Time: 09/22/19  4:27 PM  Result Value Ref Range   Glucose-Capillary 108 (H) 70 - 99 mg/dL   Comment 1 Notify RN    Comment 2 Document in Chart   Glucose, capillary     Status: Abnormal   Collection Time: 09/22/19  9:21 PM  Result Value Ref Range   Glucose-Capillary 108 (H) 70 - 99 mg/dL  CBC     Status: Abnormal   Collection Time: 09/23/19  2:31 AM  Result Value Ref Range   WBC 9.3 4.0 - 10.5 K/uL   RBC 3.69 (L) 4.22 - 5.81 MIL/uL   Hemoglobin 11.5 (L) 13.0 - 17.0 g/dL   HCT 58.0 (L) 99.8 - 33.8 %   MCV 99.2 80.0 - 100.0 fL   MCH 31.2 26.0 - 34.0 pg   MCHC 31.4 30.0 - 36.0 g/dL   RDW 25.0 53.9 - 76.7 %   Platelets 208 150 - 400 K/uL   nRBC 0.0 0.0 - 0.2 %    Comment: Performed at [facility], [address], [place], [place] [address]  Basic metabolic panel     Status: Abnormal   Collection Time: 09/23/19  2:31 AM  Result Value Ref Range   Sodium 144 135 - 145 mmol/L   Potassium 4.1 3.5 - 5.1 mmol/L   Chloride 104 98 - 111 mmol/L   CO2 26 22 - 32 mmol/L   Glucose, Bld 142 (H) 70 - 99 mg/dL   BUN 54 (H) 8 - 23 mg/dL   Creatinine, Ser 7.90 (H) 0.61 - 1.24 mg/dL   Calcium 9.2 8.9 - 24.0 mg/dL   GFR calc non Af Amer 32  (L) >60 mL/min   GFR calc Af Amer 37 (L) >60 mL/min   Anion gap 14 5 - 15    Comment: Performed at [facility], [address] [place], [place] [address]  Glucose, capillary     Status: Abnormal   Collection Time: 09/23/19  6:11 AM  Result Value Ref Range   Glucose-Capillary 117 (H) 70 - 99 mg/dL  Glucose, capillary     Status: Abnormal   Collection Time: 09/23/19 11:15 AM  Result Value Ref Range   Glucose-Capillary 145 (H) 70 - 99 mg/dL  Glucose, capillary     Status: Abnormal   Collection Time: 09/23/19  4:08 PM  Result Value Ref Range   Glucose-Capillary 138 (H) 70 - 99 mg/dL    ECHO TEE  Result Date: 09/22/2019   TRANSESOPHOGEAL ECHO REPORT   Patient Name:   DEV DHONDT Date of Exam: 09/22/2019 Medical Rec #:  [id number]  Height:       75.0 in Accession #:    [id number]       Weight:       300.0 lb Date of Birth:  14-May-1946        BSA:          2.61 m Patient Age:    73 years         BP:           98/73 mmHg Patient Gender: M                HR:           132 bpm. Exam Location:  Inpatient  Procedure: Transesophageal Echo, Color Doppler and Cardiac Doppler Indications:     atrial fibrillation  History:         Patient has prior history of Echocardiogram examinations, most                  recent 09/20/2019.  Sonographer:     Delcie Roch Referring Phys:  [id number] CADENCE H FURTH Diagnosing Phys: Epifanio Lesches MD  PROCEDURE: The TEE was followed by Cardioversion. Patients was monitored while under deep sedation. The transesophogeal probe was passed through the esophogus of the patient. The patient developed no complications during the procedure. IMPRESSIONS  1. No left atrial appendage thrombus.  2. Left ventricular ejection fraction, by visual estimation, is 50 to 55%. The left ventricle has low normal function. There is mildly increased left ventricular hypertrophy.  3. Global right ventricle has normal systolic function.The right ventricular size is normal.  4. The  mitral valve is normal in structure. Mild mitral valve regurgitation.  5. The tricuspid valve is normal in structure. Tricuspid valve regurgitation is trivial.  6. The aortic valve is abnormal. Moderately calcified. Aortic valve regurgitation is mild.  7. The pulmonic valve was grossly normal. Pulmonic valve regurgitation is not visualized.  8. 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  9. Evidence of atrial level shunting detected by color flow Doppler. FINDINGS  Left Ventricle: Left ventricular ejection fraction, by visual estimation, is 50 to 55%. The left ventricle has low normal function. The left ventricle has no regional wall motion abnormalities. There is mildly increased left ventricular hypertrophy. Right Ventricle: The right ventricular size is normal. No increase in right ventricular wall thickness. Global RV systolic function is has normal systolic function. Left Atrium: Left atrial size was mildly dilated. No left atrial appendage thrombus. Right Atrium: Right atrial size was mildly dilated Pericardium: There is no evidence of pericardial effusion. Mitral Valve: The mitral valve is normal in structure. Mild mitral valve regurgitation. Tricuspid Valve: The tricuspid valve is normal in structure. Tricuspid valve regurgitation is trivial. Aortic Valve: The aortic valve is abnormal. Aortic valve regurgitation is mild. There is moderate calcification of the aortic valve. Pulmonic Valve: The pulmonic valve was grossly normal. Pulmonic valve regurgitation is not visualized. Aorta: Aortic dilatation noted. 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 Shunts: Evidence of atrial level shunting detected by color flow Doppler.  Epifanio Lesches MD Electronically signed by Epifanio Lesches MD Signature Date/Time: 09/22/2019/5:35:37 PM    Final     Assessment/Plan  1.  AKI on CKD 3:  Likely poor forward flow situation in the setting of Afib with RVR.  Cr already  downtrending after cardioversion, suspect this will correct the problem.  Agree with holding valsartan on discharge; would do followup labs in  1 week and restart if able.  Would probably restart lasix on discharge if blood pressure allows.  He doesn't look volume down so I'm going to stop remainder of IVFs.  2.  CAP: azithro and cefriaxone  3.  Afib with RVR: s/p TEE and cardioversion, on toprol XL.    4.  Dispo: OK for home in AM from renal perspective  Pate Aylward 09/23/2019, 6:17 PM

## 2019-09-23 NOTE — Telephone Encounter (Signed)
Currently admitted. TOC call for 2/5 

## 2019-09-23 NOTE — Progress Notes (Signed)
PROGRESS NOTE    Brandon Sanchez  RRN:[id number] DOB: September 08, 1945 DOA: 09/19/2019 PCP: Merri Brunette, MD    Brief Narrative:  Brandon Garbe Ingramis a 73 y.o.malewith medical history significant ofallergic rhinitis, asthma, insulin-dependent type 2 diabetes, hypertension, hyperlipidemia presenting to the ED with complaints of shortness of breath and chest pain. EMS found patient to be in A. fib with RVR with rate 150-180. He was given IV Cardizem bolus by EMS. Patient states he has chronic shortness of breath due to his asthma but for the past 1 month it has been much worse. A. fib with RVR with rate up to 150s in the ED. Chest x-ray showing mild bibasilar infiltrates, right greater than left. Patient received Cardizem bolus and was started on infusion. Received Lovenox 1 mg/kg for anticoagulation. Received ceftriaxone and azithromycin for pneumonia.Due to hypotension with Cardizem gtt, it was stopped and cardiology consulted.  He underwent cardioversion as per cardiology.  He had brief episodes of A. fib overnight.  This morning his BUN/creatinine elevated compared to his baseline.  New events last 24 hours / Subjective: He says his shortness of breath has improved.  He is status post cardioversion.  However, labs today showed his BUN elevated at 54, creatinine elevated at 2.04.  Baseline is around 1.06.  He had brief episodes of A. fib overnight.    Assessment & Plan:   Principal Problem:   Atrial fibrillation with rapid ventricular response (HCC) Active Problems:   Diabetes mellitus (HCC)   HLD (hyperlipidemia)   HTN (hypertension)   CAP (community acquired pneumonia)  New onset A. fib with RVR -TSH 1.103 -Echocardiogram EF 50-55%, LV diastolic function could not be evaluated, no regional wall motion abnormalities  -Started on Eliquis -Currently on metoprolol -Cardiology following; s/p TEE-DCCV 2/3    Sepsis secondary to community-acquired pneumonia -Sepsis present on  admission with leukocytosis and tachycardia -COVID-19 negative, influenza negative -Blood cultures negative to date  -Chest x-ray bibasilar infiltrates R > L -Ceftriaxone and azithromycin. -WBC improving  Insulin-dependent type 2 diabetes, well controlled -Hemoglobin A1c 6.5 -Continue home NovoLog 70/30 as well as sliding scale insulin -Decrease dose of NovoLog 70/30 due to hypoglycemia  -Continue to monitor and adjust medications accordingly.  Essential hypertension -Hold home valsartan due to hypotension, AKI  -Continue metoprolol, monitor BP closely  Hyperlipidemia -Continue Lipitor  Depression -Continue zoloft  AKI  -Cr 1.21 --> 2.42 --> 2.21 -We will start gentle IV hydration, albumin infusion.  His baseline is likely around 1.06. -Consulted nephrology.  Continue to monitor BUN/trending closely.   DVT prophylaxis: Eliquis Code Status: Full Family Communication: None at bedside.  Discussed plan of care with the patient and he mentioned he was going to inform his daughter. Disposition Plan: Patient is from home prior to admission. Currently in-hospital treatment needed due to A Fib RVR. Barrier(s) to discharge include acute on chronic renal insufficiency.  Anticipate discharge once renal function improved.   Consultants:   Cardiology   Procedures:   Cardioversion  Antimicrobials:  Anti-infectives (From admission, onward)   Start     Dose/Rate Route Frequency Ordered Stop   09/21/19 2200  azithromycin (ZITHROMAX) 500 mg in sodium chloride 0.9 % 250 mL IVPB     500 mg 250 mL/hr over 60 Minutes Intravenous Daily at bedtime 09/20/19 0325     09/21/19 2200  cefTRIAXone (ROCEPHIN) 1 g in sodium chloride 0.9 % 100 mL IVPB     1 g 200 mL/hr over 30 Minutes Intravenous Daily at bedtime  09/20/19 0325     09/20/19 0030  cefTRIAXone (ROCEPHIN) 1 g in sodium chloride 0.9 % 100 mL IVPB     1 g 200 mL/hr over 30 Minutes Intravenous  Once 09/20/19 0019 09/20/19 0257    09/20/19 0030  azithromycin (ZITHROMAX) 500 mg in sodium chloride 0.9 % 250 mL IVPB     500 mg 250 mL/hr over 60 Minutes Intravenous  Once 09/20/19 0019 09/20/19 0345        Objective: Vitals:   09/22/19 2343 09/23/19 0416 09/23/19 0742 09/23/19 0755  BP: 114/68 116/72 (!) 125/59   Pulse: 80  87 86  Resp: 18 16 19 18   Temp: 98.6 F (37 C) 98.5 F (36.9 C) 97.7 F (36.5 C)   TempSrc: Oral Oral Oral   SpO2: 95%  97% 97%  Weight:      Height:        Intake/Output Summary (Last 24 hours) at 09/23/2019 0854 Last data filed at 09/23/2019 0000 Gross per 24 hour  Intake 1197.83 ml  Output --  Net 1197.83 ml   Filed Weights   09/19/19 2339 09/22/19 0842  Weight: 136.1 kg 136.1 kg    Examination:  General exam: Appears calm and comfortable  Respiratory system: Clear to auscultation. Respiratory effort normal. Cardiovascular system: S1 & S2 heard, RRR. Pedal edema. Gastrointestinal system: Abdomen is nondistended, soft and nontender. No organomegaly or masses felt. Normal bowel sounds heard. Central nervous system: Alert and oriented. No focal neurological deficits. Extremities: Symmetric 5 x 5 power. Skin: No rashes Psychiatry: Judgement and insight appear normal. Mood & affect appropriate.     Data Reviewed: I have personally reviewed following labs and imaging studies  CBC: Recent Labs  Lab 09/19/19 2342 09/20/19 0515 09/21/19 0250 09/22/19 0248 09/23/19 0231  WBC 15.4* 16.6* 17.0* 11.9* 9.3  HGB 12.9* 12.4* 12.3* 11.6* 11.5*  HCT 41.0 38.8* 38.7* 36.0* 36.6*  MCV 98.8 98.2 97.5 96.8 99.2  PLT 228 204 237 196 208   Basic Metabolic Panel: Recent Labs  Lab 09/19/19 2342 09/20/19 0515 09/21/19 0250 09/22/19 0248 09/23/19 0231  NA 142  --  138 143 144  K 3.7  --  4.3 3.6 4.1  CL 102  --  103 104 104  CO2 26  --  20* 24 26  GLUCOSE 132*  --  127* 81 142*  BUN 29*  --  47* 56* 54*  CREATININE 1.21  --  2.42* 2.21* 2.04*  CALCIUM 9.8  --  9.5 8.9 9.2   MG  --  1.6* 2.4 2.3  --    GFR: Estimated Creatinine Clearance: 48.7 mL/min (A) (by C-G formula based on SCr of 2.04 mg/dL (H)). Liver Function Tests: No results for input(s): AST, ALT, ALKPHOS, BILITOT, PROT, ALBUMIN in the last 168 hours. No results for input(s): LIPASE, AMYLASE in the last 168 hours. No results for input(s): AMMONIA in the last 168 hours. Coagulation Profile: No results for input(s): INR, PROTIME in the last 168 hours. Cardiac Enzymes: No results for input(s): CKTOTAL, CKMB, CKMBINDEX, TROPONINI in the last 168 hours. BNP (last 3 results) No results for input(s): PROBNP in the last 8760 hours. HbA1C: No results for input(s): HGBA1C in the last 72 hours. CBG: Recent Labs  Lab 09/22/19 0837 09/22/19 1113 09/22/19 1627 09/22/19 2121 09/23/19 0611  GLUCAP 76 72 108* 108* 117*   Lipid Profile: No results for input(s): CHOL, HDL, LDLCALC, TRIG, CHOLHDL, LDLDIRECT in the last 72 hours. Thyroid Function Tests: No  results for input(s): TSH, T4TOTAL, FREET4, T3FREE, THYROIDAB in the last 72 hours. Anemia Panel: No results for input(s): VITAMINB12, FOLATE, FERRITIN, TIBC, IRON, RETICCTPCT in the last 72 hours. Sepsis Labs: No results for input(s): PROCALCITON, LATICACIDVEN in the last 168 hours.  Recent Results (from the past 240 hour(s))  Respiratory Panel by RT PCR (Flu A&B, Covid) - Nasopharyngeal Swab     Status: None   Collection Time: 09/20/19 12:35 AM   Specimen: Nasopharyngeal Swab  Result Value Ref Range Status   SARS Coronavirus 2 by RT PCR NEGATIVE NEGATIVE Final    Comment: (NOTE) SARS-CoV-2 target nucleic acids are NOT DETECTED. The SARS-CoV-2 RNA is generally detectable in upper respiratoy specimens during the acute phase of infection. The lowest concentration of SARS-CoV-2 viral copies this assay can detect is 131 copies/mL. A negative result does not preclude SARS-Cov-2 infection and should not be used as the sole basis for treatment  or other patient management decisions. A negative result may occur with  improper specimen collection/handling, submission of specimen other than nasopharyngeal swab, presence of viral mutation(s) within the areas targeted by this assay, and inadequate number of viral copies (<131 copies/mL). A negative result must be combined with clinical observations, patient history, and epidemiological information. The expected result is Negative. Fact Sheet for Patients:  PinkCheek.be Fact Sheet for Healthcare Providers:  GravelBags.it This test is not yet ap proved or cleared by the [place] FDA and  has been authorized for detection and/or diagnosis of SARS-CoV-2 by FDA under an Emergency Use Authorization (EUA). This EUA will remain  in effect (meaning this test can be used) for the duration of the COVID-19 declaration under Section 564(b)(1) of the Act, 21 U.S.C. section 360bbb-3(b)(1), unless the authorization is terminated or revoked sooner.    Influenza A by PCR NEGATIVE NEGATIVE Final   Influenza B by PCR NEGATIVE NEGATIVE Final    Comment: (NOTE) The Xpert Xpress SARS-CoV-2/FLU/RSV assay is intended as an aid in  the diagnosis of influenza from Nasopharyngeal swab specimens and  should not be used as a sole basis for treatment. Nasal washings and  aspirates are unacceptable for Xpert Xpress SARS-CoV-2/FLU/RSV  testing. Fact Sheet for Patients: PinkCheek.be Fact Sheet for Healthcare Providers: GravelBags.it This test is not yet approved or cleared by the [place] FDA and  has been authorized for detection and/or diagnosis of SARS-CoV-2 by  FDA under an Emergency Use Authorization (EUA). This EUA will remain  in effect (meaning this test can be used) for the duration of the  Covid-19 declaration under Section 564(b)(1) of the Act, 21  U.S.C. section  360bbb-3(b)(1), unless the authorization is  terminated or revoked. Performed at [facility], [place] [address], [place], [place] [address]   Blood culture (routine x 2)     Status: None (Preliminary result)   Collection Time: 09/20/19  1:25 AM   Specimen: BLOOD RIGHT HAND  Result Value Ref Range Status   Specimen Description BLOOD RIGHT HAND  Final   Special Requests   Final    BOTTLES DRAWN AEROBIC AND ANAEROBIC Blood Culture adequate volume   Culture   Final    NO GROWTH 2 DAYS Performed at [facility], [place] [address], [place], [place] [address]    Report Status PENDING  Incomplete  Blood culture (routine x 2)     Status: None (Preliminary result)   Collection Time: 09/20/19  1:30 AM   Specimen: BLOOD LEFT WRIST  Result Value Ref  Range Status   Specimen Description BLOOD LEFT WRIST  Final   Special Requests   Final    BOTTLES DRAWN AEROBIC AND ANAEROBIC Blood Culture results may not be optimal due to an excessive volume of blood received in culture bottles   Culture   Final    NO GROWTH 2 DAYS Performed at [facility], [address], [place], [place] [address]    Report Status PENDING  Incomplete  MRSA PCR Screening     Status: None   Collection Time: 09/21/19  1:16 AM   Specimen: Nasal Mucosa; Nasopharyngeal  Result Value Ref Range Status   MRSA by PCR NEGATIVE NEGATIVE Final    Comment:        The GeneXpert MRSA Assay (FDA approved for NASAL specimens only), is one component of a comprehensive MRSA colonization surveillance program. It is not intended to diagnose MRSA infection nor to guide or monitor treatment for MRSA infections. Performed at [facility], [address] [place], [place] [address]          Radiology Studies: US RENAL  Result Date: 09/21/2019 CLINICAL DATA:  Acute renal failure. EXAM: RENAL / URINARY TRACT ULTRASOUND COMPLETE COMPARISON:  No prior. FINDINGS: Right Kidney: Renal measurements: 12.7 x 6.2 x 6.8 cm =  volume: 281.1 mL . Echogenicity within normal limits. No mass or hydronephrosis visualized. Left Kidney: Renal measurements: 14.4 x 6.1 x 4.5 cm = volume: 205.8 mL. Echogenicity within normal limits. No mass or hydronephrosis visualized. Bladder: Appears normal for degree of bladder distention. Other: None. IMPRESSION: No acute or focal abnormality identified. Electronically Signed   By: Maisie Fus  Register   On: 09/21/2019 10:40   ECHO TEE  Result Date: 09/22/2019   TRANSESOPHOGEAL ECHO REPORT   Patient Name:   EGE MUCKEY Date of Exam: 09/22/2019 Medical Rec #:  [id number]        Height:       75.0 in Accession #:    [id number]       Weight:       300.0 lb Date of Birth:  1945-10-18        BSA:          2.61 m Patient Age:    73 years         BP:           98/73 mmHg Patient Gender: M                HR:           132 bpm. Exam Location:  Inpatient  Procedure: Transesophageal Echo, Color Doppler and Cardiac Doppler Indications:     atrial fibrillation  History:         Patient has prior history of Echocardiogram examinations, most                  recent 09/20/2019.  Sonographer:     Delcie Roch Referring Phys:  [id number] CADENCE H FURTH Diagnosing Phys: Epifanio Lesches MD  PROCEDURE: The TEE was followed by Cardioversion. Patients was monitored while under deep sedation. The transesophogeal probe was passed through the esophogus of the patient. The patient developed no complications during the procedure. IMPRESSIONS  1. No left atrial appendage thrombus.  2. Left ventricular ejection fraction, by visual estimation, is 50 to 55%. The left ventricle has low normal function. There is mildly increased left ventricular hypertrophy.  3. Global right ventricle has normal systolic function.The right ventricular size is  normal.  4. The mitral valve is normal in structure. Mild mitral valve regurgitation.  5. The tricuspid valve is normal in structure. Tricuspid valve regurgitation is trivial.  6. The aortic valve  is abnormal. Moderately calcified. Aortic valve regurgitation is mild.  7. The pulmonic valve was grossly normal. Pulmonic valve regurgitation is not visualized.  8. 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  9. Evidence of atrial level shunting detected by color flow Doppler. FINDINGS  Left Ventricle: Left ventricular ejection fraction, by visual estimation, is 50 to 55%. The left ventricle has low normal function. The left ventricle has no regional wall motion abnormalities. There is mildly increased left ventricular hypertrophy. Right Ventricle: The right ventricular size is normal. No increase in right ventricular wall thickness. Global RV systolic function is has normal systolic function. Left Atrium: Left atrial size was mildly dilated. No left atrial appendage thrombus. Right Atrium: Right atrial size was mildly dilated Pericardium: There is no evidence of pericardial effusion. Mitral Valve: The mitral valve is normal in structure. Mild mitral valve regurgitation. Tricuspid Valve: The tricuspid valve is normal in structure. Tricuspid valve regurgitation is trivial. Aortic Valve: The aortic valve is abnormal. Aortic valve regurgitation is mild. There is moderate calcification of the aortic valve. Pulmonic Valve: The pulmonic valve was grossly normal. Pulmonic valve regurgitation is not visualized. Aorta: Aortic dilatation noted. 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 Shunts: Evidence of atrial level shunting detected by color flow Doppler.  Epifanio Lesches MD Electronically signed by Epifanio Lesches MD Signature Date/Time: 09/22/2019/5:35:37 PM    Final         Scheduled Meds: . apixaban  5 mg Oral BID  . atorvastatin  40 mg Oral Daily  . fluticasone furoate-vilanterol  1 puff Inhalation Daily  . insulin aspart  0-9 Units Subcutaneous TID WC  . insulin aspart protamine- aspart  20 Units Subcutaneous BID WC  . metoprolol succinate  200 mg  Oral Daily  . multivitamin with minerals  1 tablet Oral Daily  . pantoprazole  40 mg Oral Daily  . sertraline  100 mg Oral Daily  . sodium chloride flush  3 mL Intravenous Once   Continuous Infusions: . sodium chloride Stopped (09/22/19 2343)  . azithromycin Stopped (09/22/19 2315)  . cefTRIAXone (ROCEPHIN)  IV Stopped (09/22/19 2154)     LOS: 3 days    Vonzella Nipple, MD [facility]   To contact the attending provider between 7A-7P or the covering provider during after hours 7P-7A, please log into the web site [email or web address] and access using universal [place] password for that web site. If you do not have the password, please call the hospital operator.  09/23/2019, 8:54 AM

## 2019-09-23 NOTE — Telephone Encounter (Signed)
TOC Patient- Please call Patient- Pt have an appt on 10-01-19 with   Dr Cristal Deer.

## 2019-09-23 NOTE — Progress Notes (Signed)
Progress Note  Patient Name: Brandon Sanchez Date of Encounter: 09/23/2019  Primary Cardiologist: Jodelle Red, MD   Subjective   Blood pressure improved this AM. Has remained largely in sinus rhythm, though has had brief episodes of likely afib overnight. His breathing is much improved compared to prior, able to ambulate with much better breathing.  He denies significant snoring unless he is on his back. Reports having a sleep study >15 years ago. Discussed that with his overnight blips of afib, I am concerned there may be underlying sleep apnea.  We spent >40 minutes today discussing afib management, plan for medications. He has many questions re: how he will know he is in afib as he did not feel palpitations. Discussed pulse ox for HR, options such as [employer]ia[employer] for rhythm. I recommended he follow his symptoms--if he has severely reduced exercise tolerance/shortness of breath again, this would suggest afib.  Educated on importance of anticoagulant with no missed doses. Also discussed with him that I would hold his valsartan and discharge given blood pressure and kidney function, and we can reassess this in the future.  Inpatient Medications    Scheduled Meds: . apixaban  5 mg Oral BID  . atorvastatin  40 mg Oral Daily  . fluticasone furoate-vilanterol  1 puff Inhalation Daily  . insulin aspart  0-9 Units Subcutaneous TID WC  . insulin aspart protamine- aspart  20 Units Subcutaneous BID WC  . metoprolol succinate  200 mg Oral Daily  . multivitamin with minerals  1 tablet Oral Daily  . pantoprazole  40 mg Oral Daily  . sertraline  100 mg Oral Daily  . sodium chloride flush  3 mL Intravenous Once   Continuous Infusions: . sodium chloride Stopped (09/22/19 2343)  . azithromycin Stopped (09/22/19 2315)  . cefTRIAXone (ROCEPHIN)  IV Stopped (09/22/19 2154)   PRN Meds: sodium chloride, acetaminophen **OR** acetaminophen, albuterol, loratadine   Vital Signs     Vitals:   09/22/19 2343 09/23/19 0416 09/23/19 0742 09/23/19 0755  BP: 114/68 116/72 (!) 125/59   Pulse: 80  87 86  Resp: 18 16 19 18   Temp: 98.6 F (37 C) 98.5 F (36.9 C) 97.7 F (36.5 C)   TempSrc: Oral Oral Oral   SpO2: 95%  97% 97%  Weight:      Height:        Intake/Output Summary (Last 24 hours) at 09/23/2019 0913 Last data filed at 09/23/2019 0000 Gross per 24 hour  Intake 1197.83 ml  Output -  Net 1197.83 ml   Last 3 Weights 09/22/2019 09/19/2019 07/08/2016  Weight (lbs) 300 lb 300 lb 300 lb  Weight (kg) 136.079 kg 136.079 kg 136.079 kg      Telemetry    Largely sinus rhythm, though has had brief episodes of likely afib (longest at 5:53 this AM)- Personally Reviewed  ECG    Yesterday post cardioversion, sinus at 70 bpm - Personally Reviewed  Physical Exam   GEN: Well nourished, well developed in no acute distress HEENT: Normal, moist mucous membranes NECK: No JVD visible sitting upright CARDIAC: regular rhythm, normal S1 and S2, no rubs or gallops. No murmur. VASCULAR: Radial and DP pulses 2+ bilaterally. No carotid bruits RESPIRATORY:  Clear to auscultation without wheezing or rhonchi. Basilar rales much improved and nearly resolved ABDOMEN: Soft, non-tender, non-distended MUSCULOSKELETAL:  Ambulates independently SKIN: Warm and dry, resolved LE edema (only very trace remains) NEUROLOGIC:  Alert and oriented x 3. No focal neuro deficits noted. PSYCHIATRIC:  Normal affect   Labs    High Sensitivity Troponin:   Recent Labs  Lab 09/19/19 2342 09/20/19 0217  TROPONINIHS 11 12      Chemistry Recent Labs  Lab 09/21/19 0250 09/22/19 0248 09/23/19 0231  NA 138 143 144  K 4.3 3.6 4.1  CL 103 104 104  CO2 20* 24 26  GLUCOSE 127* 81 142*  BUN 47* 56* 54*  CREATININE 2.42* 2.21* 2.04*  CALCIUM 9.5 8.9 9.2  GFRNONAA 26* 29* 32*  GFRAA 30* 33* 37*  ANIONGAP 15 15 14      Hematology Recent Labs  Lab 09/21/19 0250 09/22/19 0248 09/23/19 0231  WBC  17.0* 11.9* 9.3  RBC 3.97* 3.72* 3.69*  HGB 12.3* 11.6* 11.5*  HCT 38.7* 36.0* 36.6*  MCV 97.5 96.8 99.2  MCH 31.0 31.2 31.2  MCHC 31.8 32.2 31.4  RDW 13.2 13.2 13.2  PLT 237 196 208    BNP Recent Labs  Lab 09/20/19 0515  BNP 289.1*     DDimer  Recent Labs  Lab 09/20/19 1013  DDIMER 1.04*     Radiology    CXR 09/19/19 FINDINGS: Cardiac shadow is mildly prominent but accentuated by the portable technique. Bibasilar opacities are noted right greater than left likely representing early infiltrate. No sizable effusion is seen. No bony abnormality is noted.  IMPRESSION: Mild bibasilar infiltrates right greater than left.  Cardiac Studies   Echo 09/20/19 1. Left ventricular ejection fraction, by visual estimation, is 50 to  55%. The left ventricle has normal function. There is mildly increased  left ventricular hypertrophy.  2. Definity contrast agent was given IV to delineate the left ventricular  endocardial borders.  3. Left ventricular diastolic function could not be evaluated.  4. Mildly dilated left ventricular internal cavity size.  5. The left ventricle has no regional wall motion abnormalities.  6. Global right ventricle has normal systolic function.The right  ventricular size is mildly enlarged.  7. Left atrial size was mildly dilated.  8. Right atrial size was mildly dilated.  9. Mild mitral annular calcification.  10. Trivial mitral valve regurgitation. No evidence of mitral stenosis.  11. The tricuspid valve is normal in structure. Tricuspid valve  regurgitation is trivial.  12. The aortic valve was not well visualized. Aortic valve regurgitation  is not visualized. Mild to moderate aortic valve stenosis.  13. The pulmonic valve was not well visualized. Pulmonic valve  regurgitation is not visualized.  14. The inferior vena cava is dilated in size with <50% respiratory  variability, suggesting right atrial pressure of 15 mmHg.  15.  Technically difficult; definity used; low normal LV systolic function;  mild LVH; mild LVE; calcified aortic valve with mild to moderate AS (mean  gradient 19 mmHg); mild LAE/RAE/RVE.   Patient Profile     74 y.o. male with PMH asthma, type II diabetes, dyslipidemia, hypertension who presented with several weeks of fatigue/worsening shortness of breath. Found to have new diagnosis of atrial fibrillation with RVR and possible community acquired pneumonia.  Assessment & Plan    New diagnosis of atrial fibrillation with RVR -extensive education done -started on apixaban for anticoagulation this admission, tolerating -CHA2DS2/VAS Stroke Risk Points= 3 -s/p TEE-CV 09/22/19. Now in sinus rhythm, had occasional brief runs of likely afib ovrenight -consolidating to 200 mg metoprolol succinate daily -TTE with low normal EF. Technically difficult images, aortic valve difficult to see but noted to be mild-moderate AS. -given body habitus, afib, discuss sleep study as an outpatient  LE edema, crackles at bases: appears nearly completely resolved. Cr now downtrending, suspect due to improved BP/HR  Possible community acquired pneumonia, Covid negative: -question if this is the driver of his afib, or if rather he has had afib for several weeks and that is actually the etiology of his symptoms. Management per primary team  Chest pain: has not recurred -troponin trend not consistent with ACS -describes recently as more pleuritic in nature, now resolved -no indication for ischemic workup this admission  History of hypertension, though hypotensive this admission -holding home valsartan -with new metoprolol, he will not have room for valsartan at discharge (and has resolving AKI as well).  Hyperlipidemia, type II diabetes -continue atorvastatin given ASCVD risk  [facility] will sign off.   Medication Recommendations:  Continue metoprolol succinate 200 mg daily. Do not continue valsartan at  discharge Other recommendations (labs, testing, etc):  BMET ~1 week Follow up as an outpatient:  We will arrange for him to see me in the clinic for a TOC post discharge follow up in 7-14 days. Need close follow up to make sure he remains rate controlled/ideally rhythm controlled with stable blood pressure and normalized renal function.  For questions or updates, please contact [place] Please consult [email or web address] for contact info under   Total time of encounter:  >40 minutes spent in face-to-face patient care. This time includes coordination of care and counseling regarding atrial fibrillation, symptoms, management, long term plan.  Buford Dresser, MD, PhD [facility]     Signed, Buford Dresser, MD  09/23/2019, 9:13 AM

## 2019-09-24 LAB — BASIC METABOLIC PANEL
Anion gap: 11 (ref 5–15)
BUN: 41 mg/dL — ABNORMAL HIGH (ref 8–23)
CO2: 26 mmol/L (ref 22–32)
Calcium: 9 mg/dL (ref 8.9–10.3)
Chloride: 106 mmol/L (ref 98–111)
Creatinine, Ser: 1.67 mg/dL — ABNORMAL HIGH (ref 0.61–1.24)
GFR calc Af Amer: 47 mL/min — ABNORMAL LOW (ref >60)
GFR calc non Af Amer: 40 mL/min — ABNORMAL LOW (ref >60)
Glucose, Bld: 138 mg/dL — ABNORMAL HIGH (ref 70–99)
Potassium: 4.1 mmol/L (ref 3.5–5.1)
Sodium: 143 mmol/L (ref 135–145)

## 2019-09-24 LAB — GLUCOSE, CAPILLARY
Glucose-Capillary: 129 mg/dL — ABNORMAL HIGH (ref 70–99)
Glucose-Capillary: 116 mg/dL — ABNORMAL HIGH (ref 70–99)
Glucose-Capillary: 137 mg/dL — ABNORMAL HIGH (ref 70–99)

## 2019-09-24 LAB — CBC
HCT: 34.3 % — ABNORMAL LOW (ref 39.0–52.0)
Hemoglobin: 11 g/dL — ABNORMAL LOW (ref 13.0–17.0)
MCH: 31.3 pg (ref 26.0–34.0)
MCHC: 32.1 g/dL (ref 30.0–36.0)
MCV: 97.7 fL (ref 80.0–100.0)
Platelets: 206 10*3/uL (ref 150–400)
RBC: 3.51 MIL/uL — ABNORMAL LOW (ref 4.22–5.81)
RDW: 13.3 % (ref 11.5–15.5)
WBC: 7.7 10*3/uL (ref 4.0–10.5)
nRBC: 0 % (ref 0.0–0.2)

## 2019-09-24 LAB — SODIUM, URINE, RANDOM: Sodium, Ur: <10 mmol/L

## 2019-09-24 LAB — CREATININE, URINE, RANDOM: Creatinine, Urine: 192.54 mg/dL

## 2019-09-24 MED ORDER — METOPROLOL SUCCINATE ER 200 MG PO TB24: 200.0000 mg | ORAL_TABLET | Freq: Every day | ORAL | 0 refills | Status: DC

## 2019-09-24 MED ORDER — AZITHROMYCIN 500 MG PO TABS
500.0000 mg | ORAL_TABLET | Freq: Once | ORAL | Status: AC
  Administered 2019-09-24: 09:00:00 500 mg via ORAL
  Filled 2019-09-24: qty 1

## 2019-09-24 MED ORDER — APIXABAN 5 MG PO TABS: 5.0000 mg | ORAL_TABLET | Freq: Two times a day (BID) | ORAL | 0 refills | Status: DC

## 2019-09-24 MED ORDER — FUROSEMIDE 40 MG PO TABS: 40.0000 mg | ORAL_TABLET | Freq: Every day | ORAL | 0 refills | Status: DC | PRN

## 2019-09-24 MED ORDER — CEFDINIR 300 MG PO CAPS
300.0000 mg | ORAL_CAPSULE | Freq: Two times a day (BID) | ORAL | Status: DC
  Administered 2019-09-24: 300 mg via ORAL
  Filled 2019-09-24: qty 1

## 2019-09-24 MED ORDER — FUROSEMIDE 40 MG PO TABS: 40.0000 mg | ORAL_TABLET | Freq: Two times a day (BID) | ORAL | 0 refills | Status: DC

## 2019-09-24 MED FILL — ELIQUIS 5 MG TABLET: 5 | 30 days supply | Qty: 60 | Fill #0

## 2019-09-24 MED FILL — FUROSEMIDE 40 MG TABLET: 40 | 10 days supply | Qty: 30 | Fill #0

## 2019-09-24 MED FILL — METOPROLOL SUCCINATE ER 100: 100 | 30 days supply | Qty: 60 | Fill #0

## 2019-09-24 NOTE — Progress Notes (Signed)
Discharge instructions gone over with patient and patient's daughter. Both stated that they understood.  IV discontinued and patient is ready to go home.  [name] 09/24/2019 1:26 PM

## 2019-09-24 NOTE — Progress Notes (Signed)
[facility] NEPHROLOGY PROGRESS NOTE  Assessment/ Plan: Pt is a 74 y.o. yo male with hypertension, DM, HLD admitted with A. fib with RVR, consulted for AKI.  #Acute kidney injury on CKD likely due to hemodynamically change related with A. fib with RVR: Serum creatinine level is improving.  Kidney ultrasound without any obstruction.  Continue to hold ARB which can be resumed as outpatient.  #A. fib with RVR status post TEE and cardioversion, on metoprolol.  #CAP: On antibiotics.  Nothing further to add.  Okay to discharge from renal perspective.  Recommend renal panel in a week with PCP.  Please call back with question.  Subjective: Seen and examined at bedside.  Denies nausea vomiting chest pain shortness of breath.  No new event. Objective Vital signs in last 24 hours: Vitals:   09/23/19 1610 09/23/19 2100 09/24/19 0011 09/24/19 0320  BP: (!) 112/59 113/64 109/61 103/69  Pulse: 74     Resp: (!) 21  18 15   Temp: 99 F (37.2 C) 98.5 F (36.9 C)  98.7 F (37.1 C)  TempSrc: Oral Oral  Oral  SpO2:      Weight:      Height:       Weight change:   Intake/Output Summary (Last 24 hours) at 09/24/2019 0727 Last data filed at 09/24/2019 0100 Gross per 24 hour  Intake 1269.33 ml  Output --  Net 1269.33 ml       Labs: Basic Metabolic Panel: Recent Labs  Lab 09/22/19 0248 09/23/19 0231 09/24/19 0141  NA 143 144 143  K 3.6 4.1 4.1  CL 104 104 106  CO2 24 26 26   GLUCOSE 81 142* 138*  BUN 56* 54* 41*  CREATININE 2.21* 2.04* 1.67*  CALCIUM 8.9 9.2 9.0   Liver Function Tests: No results for input(s): AST, ALT, ALKPHOS, BILITOT, PROT, ALBUMIN in the last 168 hours. No results for input(s): LIPASE, AMYLASE in the last 168 hours. No results for input(s): AMMONIA in the last 168 hours. CBC: Recent Labs  Lab 09/20/19 0515 09/20/19 0515 09/21/19 0250 09/21/19 0250 09/22/19 0248 09/23/19 0231 09/24/19 0141  WBC 16.6*   < > 17.0*   < > 11.9* 9.3 7.7  HGB  12.4*   < > 12.3*   < > 11.6* 11.5* 11.0*  HCT 38.8*   < > 38.7*   < > 36.0* 36.6* 34.3*  MCV 98.2  --  97.5  --  96.8 99.2 97.7  PLT 204   < > 237   < > 196 208 206   < > = values in this interval not displayed.   Cardiac Enzymes: No results for input(s): CKTOTAL, CKMB, CKMBINDEX, TROPONINI in the last 168 hours. CBG: Recent Labs  Lab 09/22/19 2121 09/23/19 0611 09/23/19 1115 09/23/19 1608 09/23/19 2114  GLUCAP 108* 117* 145* 138* 132*    Iron Studies: No results for input(s): IRON, TIBC, TRANSFERRIN, FERRITIN in the last 72 hours. Studies/Results: ECHO TEE  Result Date: 09/22/2019   TRANSESOPHOGEAL ECHO REPORT   Patient Name:   Brandon Sanchez Date of Exam: 09/22/2019 Medical Rec #:  Brandon Sanchez        Height:       75.0 in Accession #:    [id number]       Weight:       300.0 lb Date of Birth:  [birth year]        BSA:          2.61 m Patient Age:  73 years         BP:           98/73 mmHg Patient Gender: M                HR:           132 bpm. Exam Location:  Inpatient  Procedure: Transesophageal Echo, Color Doppler and Cardiac Doppler Indications:     atrial fibrillation  History:         Patient has prior history of Echocardiogram examinations, most                  recent 09/20/2019.  Sonographer:     Johny Chess Referring Phys:  [id number] [place] Diagnosing Phys: Oswaldo Milian MD  PROCEDURE: The TEE was followed by Cardioversion. Patients was monitored while under deep sedation. The transesophogeal probe was passed through the esophogus of the patient. The patient developed no complications during the procedure. IMPRESSIONS  1. No left atrial appendage thrombus.  2. Left ventricular ejection fraction, by visual estimation, is 50 to 55%. The left ventricle has low normal function. There is mildly increased left ventricular hypertrophy.  3. Global right ventricle has normal systolic function.The right ventricular size is normal.  4. The mitral valve is normal in structure.  Mild mitral valve regurgitation.  5. The tricuspid valve is normal in structure. Tricuspid valve regurgitation is trivial.  6. The aortic valve is abnormal. Moderately calcified. Aortic valve regurgitation is mild.  7. The pulmonic valve was grossly normal. Pulmonic valve regurgitation is not visualized.  8. 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  9. Evidence of atrial level shunting detected by color flow Doppler. FINDINGS  Left Ventricle: Left ventricular ejection fraction, by visual estimation, is 50 to 55%. The left ventricle has low normal function. The left ventricle has no regional wall motion abnormalities. There is mildly increased left ventricular hypertrophy. Right Ventricle: The right ventricular size is normal. No increase in right ventricular wall thickness. Global RV systolic function is has normal systolic function. Left Atrium: Left atrial size was mildly dilated. No left atrial appendage thrombus. Right Atrium: Right atrial size was mildly dilated Pericardium: There is no evidence of pericardial effusion. Mitral Valve: The mitral valve is normal in structure. Mild mitral valve regurgitation. Tricuspid Valve: The tricuspid valve is normal in structure. Tricuspid valve regurgitation is trivial. Aortic Valve: The aortic valve is abnormal. Aortic valve regurgitation is mild. There is moderate calcification of the aortic valve. Pulmonic Valve: The pulmonic valve was grossly normal. Pulmonic valve regurgitation is not visualized. Aorta: Aortic dilatation noted. 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 Shunts: Evidence of atrial level shunting detected by color flow Doppler.  Oswaldo Milian MD Electronically signed by Oswaldo Milian MD Signature Date/Time: 09/22/2019/5:35:37 PM    Final     Medications: Infusions: . sodium chloride Stopped (09/22/19 2343)  . azithromycin 500 mg (09/23/19 2236)  . cefTRIAXone (ROCEPHIN)  IV 1 g (09/24/19  0004)    Scheduled Medications: . apixaban  5 mg Oral BID  . atorvastatin  40 mg Oral Daily  . fluticasone furoate-vilanterol  1 puff Inhalation Daily  . insulin aspart  0-9 Units Subcutaneous TID WC  . insulin aspart protamine- aspart  20 Units Subcutaneous BID WC  . metoprolol succinate  200 mg Oral Daily  . multivitamin with minerals  1 tablet Oral Daily  . pantoprazole  40 mg Oral Daily  .  sertraline  100 mg Oral Daily  . sodium chloride flush  3 mL Intravenous Once    have reviewed scheduled and prn medications.  Physical Exam: General:NAD, comfortable, able to lie flat Heart:RRR, s1s2 nl Lungs:clear b/l, no crackle Abdomen:soft, Non-tender, non-distended Extremities:No edema Neurology: Alert, awake and following commands  Powell Halbert Jaynie Collins 09/24/2019,7:27 AM  LOS: 4 days  Pager: [phone]

## 2019-09-24 NOTE — TOC Progression Note (Signed)
Transition of Care [facility]) - Progression Note    Patient Details  Name: Brandon Sanchez MRN: [id number] Date of Birth: 06-17-1946  [facility] CM/SW Contact  Leone Haven, RN Phone Number: 09/24/2019, 9:17 AM  Clinical Narrative:    NCM spoke with patient informed him of the copay amt for his eliquis which is 50.00.  TOC is filling the first 30 day free for patient.        Expected Discharge Plan and Services           Expected Discharge Date: 09/24/19                                     Social Determinants of Health (SDOH) Interventions    Readmission Risk Interventions No flowsheet data found.

## 2019-09-24 NOTE — Telephone Encounter (Signed)
Patient still currently admitted.  

## 2019-09-24 NOTE — Plan of Care (Signed)
Patient is ready for discharge

## 2019-09-25 LAB — CULTURE, BLOOD (ROUTINE X 2)
Culture: NO GROWTH
Culture: NO GROWTH
Special Requests: ADEQUATE

## 2019-09-25 NOTE — Discharge Summary (Signed)
[facility] Discharge Summary   Patient: Brandon Sanchez GUR:[id number]  PCP: Deland Pretty, MD  Date of admission: 09/19/2019   Date of discharge: 09/24/2019     Discharge Diagnoses:   Principal Problem:   Atrial fibrillation with rapid ventricular response ([place]) Active Problems:   Diabetes mellitus ([place])   HLD (hyperlipidemia)   HTN (hypertension)   CAP (community acquired pneumonia)   Admitted From: home Disposition:  Home   Recommendations for Outpatient Follow-up:  1. PCP: Follow-up with PCP and cardiology 2. Follow up LABS/TEST: BMP in 1 week  Follow-up Information    Buford Dresser, MD Follow up on 10/01/2019.   Specialty: Cardiology Why: Please go to hospital follow-up February 12th at 9:40 AM Contact information: [address] [place] [address] [phone]        Deland Pretty, MD. Schedule an appointment as soon as possible for a visit in 1 week(s).   Specialty: Internal Medicine Why: need BMP in 1 week.  Contact information: [address] [place] [address] [phone]          Diet recommendation: Cardiac diet  Activity: The patient is advised to gradually reintroduce usual activities, as tolerated  Discharge Condition: stable  Code Status: Full code   History of present illness: As per the H and P dictated on admission, ": Brandon Sanchez is a 74 y.o. male with medical history significant of allergic rhinitis, asthma, insulin-dependent type 2 diabetes, hypertension, hyperlipidemia presenting to the ED with complaints of shortness of breath and chest pain.  EMS found patient to be in A. fib with RVR with rate 150-180.  He was given IV Cardizem bolus by EMS.  Patient states he has chronic shortness of breath due to his asthma but for the past 1 month it has been much worse.  No cough or wheezing.  No fevers.  Last night he was very short of breath and experienced substernal chest pain which radiated  to his jaw and left arm.  He has not experienced any heart palpitations.  Denies prior history of A. fib.  Does state that several years ago he was told that he had a problem with his heart valve.  He has previously been seen by a cardiologist.  No other complaints.  Denies nausea, vomiting, abdominal pain, diarrhea, dysuria."  Hospital Course:  Summary of his active problems in the hospital is as following.   New onset A. fib with RVR -TSH 1.103 -Echocardiogram EF 48-54%, LV diastolic function could not be evaluated, no regional wall motion abnormalities  -Started on Eliquis -Currently on metoprolol -Cardiology following; s/p TEE-DCCV 2/3  -Continue Eliquis on discharge  Sepsis secondary to community-acquired pneumonia -Sepsis present on admission with leukocytosis and tachycardia -COVID-19 negative, influenza negative -Blood cultures negative to date  -Chest x-ray bibasilar infiltrates R > L -Ceftriaxone and azithromycin. -WBC improving  Insulin-dependent type 2 diabetes, well controlled -Hemoglobin A1c 6.5 -Continue home NovoLog 70/30 as well as sliding scale insulin -Decrease dose of NovoLog 70/30 due to hypoglycemia -Continue to monitor and adjust medications accordingly.  Essential hypertension -Hold home valsartan due to hypotension, AKI  -Continue metoprolol, monitor BP closely  Hyperlipidemia -Continue Lipitor  Depression -Continue zoloft  AKI  Likely from overdiuresis as well as hemodynamic changes.  Patient was receiving IV Lasix. Consulted nephrology.    No further work-up  renal function improving after hydration. We will be discontinuing ARB Holding the Lasix until Monday to protect the kidneys.  Patient was ambulatory without any assistance. On the day of the discharge the patient's vitals were stable, and no other acute medical condition were reported by patient. the patient was felt safe to be discharge at Home with no therapy needed on  discharge.  Consultants: Cardiology  Procedures: Echocardiogram  TEE cardioversion  Discharge Exam: General: Appear in no distress, no Rash; Oral Mucosa Clear, moist. Cardiovascular: S1 and S2 Present, no Murmur, Respiratory: normal respiratory effort, Bilateral Air entry present and no Crackles, no wheezes Abdomen: Bowel Sound present, Soft and no tenderness, no hernia Extremities: bilateral  Pedal edema, no calf tenderness Neurology: alert and oriented to time, place, and person affect appropriate.  Filed Weights   09/19/19 2339 09/22/19 0842  Weight: 136.1 kg 136.1 kg   Vitals:   09/24/19 0750 09/24/19 1110  BP: 118/86 (!) 115/98  Pulse: 80 64  Resp: (!) 22 (!) 21  Temp: 98.7 F (37.1 C) 98.7 F (37.1 C)  SpO2:      DISCHARGE MEDICATION: Allergies as of 09/24/2019   No Known Allergies     Medication List    STOP taking these medications   aspirin 81 MG tablet   valsartan 80 MG tablet Commonly known as: DIOVAN     TAKE these medications   acetaminophen 500 MG tablet Commonly known as: TYLENOL Take 500 mg by mouth See admin instructions. 1500mg  in the morning before lunch, then 1500mg  at bedtime   albuterol 108 (90 Base) MCG/ACT inhaler Commonly known as: ProAir HFA 2 puffs every 4 hours as needed only  if your can't catch your breath   apixaban 5 MG Tabs tablet Commonly known as: ELIQUIS Take 1 tablet (5 mg total) by mouth 2 (two) times daily.   atorvastatin 40 MG tablet Commonly known as: LIPITOR Take 40 mg by mouth daily.   cetirizine-pseudoephedrine 5-120 MG tablet Commonly known as: ZYRTEC-D Take 1 tablet by mouth daily as needed for allergies.   Fluticasone-Salmeterol 100-50 MCG/DOSE Aepb Commonly known as: ADVAIR Inhale 1 puff into the lungs daily.   furosemide 40 MG tablet Commonly known as: LASIX Take 1 tablet (40 mg total) by mouth 2 (two) times daily. 2 tablets in AM and 1 tablet midday. Start taking on: September 27, 2019 What changed:    how to take this  when to take this  These instructions start on September 27, 2019. If you are unsure what to do until then, ask your doctor or other care provider.   Glucosamine Chondr 1500 Complx Caps Take 1 capsule by mouth 2 (two) times daily.   insulin aspart protamine- aspart (70-30) 100 UNIT/ML injection Commonly known as: NOVOLOG MIX 70/30 Inject 40 Units into the skin 2 (two) times daily at 8 am and 10 pm.   metoprolol 200 MG 24 hr tablet Commonly known as: TOPROL-XL Take 1 tablet (200 mg total) by mouth daily. Take with or immediately following a meal.   multivitamin with minerals Tabs tablet Take 1 tablet by mouth daily.   omeprazole 40 MG capsule Commonly known as: PRILOSEC Take 40 mg by mouth daily as needed (heart burn).   sertraline 100 MG tablet Commonly known as: ZOLOFT Take 100 mg by mouth daily.      No Known Allergies Discharge Instructions    Diet - low sodium heart healthy   Complete by: As directed    Increase activity slowly   Complete by: As directed       The results of significant diagnostics from this  hospitalization (including imaging, microbiology, ancillary and laboratory) are listed below for reference.    Significant Diagnostic Studies: US RENAL  Result Date: 09/21/2019 CLINICAL DATA:  Acute renal failure. EXAM: RENAL / URINARY TRACT ULTRASOUND COMPLETE COMPARISON:  No prior. FINDINGS: Right Kidney: Renal measurements: 12.7 x 6.2 x 6.8 cm = volume: 281.1 mL . Echogenicity within normal limits. No mass or hydronephrosis visualized. Left Kidney: Renal measurements: 14.4 x 6.1 x 4.5 cm = volume: 205.8 mL. Echogenicity within normal limits. No mass or hydronephrosis visualized. Bladder: Appears normal for degree of bladder distention. Other: None. IMPRESSION: No acute or focal abnormality identified. Electronically Signed   By: Maisie Fus  Register   On: 09/21/2019 10:40   DG Chest Port 1 View  Result Date: 09/20/2019 CLINICAL DATA:  Shortness  of breath for 3 weeks EXAM: PORTABLE CHEST 1 VIEW COMPARISON:  07/08/2016 FINDINGS: Cardiac shadow is mildly prominent but accentuated by the portable technique. Bibasilar opacities are noted right greater than left likely representing early infiltrate. No sizable effusion is seen. No bony abnormality is noted. IMPRESSION: Mild bibasilar infiltrates right greater than left. Electronically Signed   By: Alcide Clever M.D.   On: 09/20/2019 00:01   ECHOCARDIOGRAM COMPLETE  Result Date: 09/20/2019   ECHOCARDIOGRAM REPORT   Patient Name:   Brandon Sanchez Date of Exam: 09/20/2019 Medical Rec #:  [id number]        Height:       75.0 in Accession #:    [id number]       Weight:       300.0 lb Date of Birth:  1945-11-24        BSA:          2.61 m Patient Age:    73 years         BP:           93/65 mmHg Patient Gender: M                HR:           127 bpm. Exam Location:  Inpatient Procedure: 2D Echo and Intracardiac Opacification Agent Indications:    Atrial Fibrillation 427.31 / I48.91  History:        Patient has no prior history of Echocardiogram examinations.                 Risk Factors:Hypertension, Dyslipidemia, Diabetes and Obesity.  Sonographer:    Leeroy Bock Turrentine Referring Phys: [id number] John Giovanni  Sonographer Comments: Technically difficult study due to poor echo windows. Image acquisition challenging due to patient body habitus. IMPRESSIONS  1. Left ventricular ejection fraction, by visual estimation, is 50 to 55%. The left ventricle has normal function. There is mildly increased left ventricular hypertrophy.  2. Definity contrast agent was given IV to delineate the left ventricular endocardial borders.  3. Left ventricular diastolic function could not be evaluated.  4. Mildly dilated left ventricular internal cavity size.  5. The left ventricle has no regional wall motion abnormalities.  6. Global right ventricle has normal systolic function.The right ventricular size is mildly enlarged.  7. Left  atrial size was mildly dilated.  8. Right atrial size was mildly dilated.  9. Mild mitral annular calcification. 10. Trivial mitral valve regurgitation. No evidence of mitral stenosis. 11. The tricuspid valve is normal in structure. Tricuspid valve regurgitation is trivial. 12. The aortic valve was not well visualized. Aortic valve regurgitation is not visualized. Mild to moderate aortic valve stenosis. 13. The pulmonic  valve was not well visualized. Pulmonic valve regurgitation is not visualized. 14. The inferior vena cava is dilated in size with <50% respiratory variability, suggesting right atrial pressure of 15 mmHg. 15. Technically difficult; definity used; low normal LV systolic function; mild LVH; mild LVE; calcified aortic valve with mild to moderate AS (mean gradient 19 mmHg); mild LAE/RAE/RVE. FINDINGS  Left Ventricle: Left ventricular ejection fraction, by visual estimation, is 50 to 55%. The left ventricle has normal function. Definity contrast agent was given IV to delineate the left ventricular endocardial borders. The left ventricle has no regional wall motion abnormalities. The left ventricular internal cavity size was mildly dilated left ventricle. There is mildly increased left ventricular hypertrophy. The left ventricular diastology could not be evaluated due to atrial fibrillation. Left ventricular diastolic function could not be evaluated. Normal left atrial pressure. Right Ventricle: The right ventricular size is mildly enlarged.Global RV systolic function is has normal systolic function. Left Atrium: Left atrial size was mildly dilated. Right Atrium: Right atrial size was mildly dilated Pericardium: There is no evidence of pericardial effusion. Mitral Valve: The mitral valve is normal in structure. Mild mitral annular calcification. Trivial mitral valve regurgitation. No evidence of mitral valve stenosis by observation. Tricuspid Valve: The tricuspid valve is normal in structure. Tricuspid  valve regurgitation is trivial. Aortic Valve: The aortic valve was not well visualized. Aortic valve regurgitation is not visualized. Mild to moderate aortic stenosis is present. Aortic valve mean gradient measures 18.8 mmHg. Aortic valve peak gradient measures 29.9 mmHg. Aortic valve area, by VTI measures 0.99 cm. Pulmonic Valve: The pulmonic valve was not well visualized. Pulmonic valve regurgitation is not visualized. Pulmonic regurgitation is not visualized. Aorta: The aortic root is normal in size and structure. Venous: The inferior vena cava is dilated in size with less than 50% respiratory variability, suggesting right atrial pressure of 15 mmHg.  Additional Comments: Technically difficult; definity used; low normal LV systolic function; mild LVH; mild LVE; calcified aortic valve with mild to moderate AS (mean gradient 19 mmHg); mild LAE/RAE/RVE.  LEFT VENTRICLE PLAX 2D LVIDd:         5.55 cm LVIDs:         4.30 cm LV PW:         1.15 cm LV IVS:        1.15 cm LVOT diam:     2.10 cm LV SV:         67 ml LV SV Index:   24.70 LVOT Area:     3.46 cm  LEFT ATRIUM             Index LA diam:        5.30 cm 2.03 cm/m LA Vol (A2C):   79.3 ml 30.42 ml/m LA Vol (A4C):   66.5 ml 25.51 ml/m LA Biplane Vol: 73.0 ml 28.00 ml/m  AORTIC VALVE AV Area (Vmax):    0.99 cm AV Area (Vmean):   0.91 cm AV Area (VTI):     0.99 cm AV Vmax:           273.20 cm/s AV Vmean:          204.600 cm/s AV VTI:            0.527 m AV Peak Grad:      29.9 mmHg AV Mean Grad:      18.8 mmHg LVOT Vmax:         78.30 cm/s LVOT Vmean:        53.800  cm/s LVOT VTI:          0.151 m LVOT/AV VTI ratio: 0.29  AORTA Ao Root diam: 2.90 cm MITRAL VALVE MV Area (PHT): 3.17 cm             SHUNTS MV PHT:        69.31 msec           Systemic VTI:  0.15 m MV Decel Time: 239 msec             Systemic Diam: 2.10 cm MV E velocity: 127.67 cm/s 103 cm/s  Olga MillersBrian Crenshaw MD Electronically signed by Olga MillersBrian Crenshaw MD Signature Date/Time: 09/20/2019/3:56:17 PM     Final    ECHO TEE  Result Date: 09/22/2019   TRANSESOPHOGEAL ECHO REPORT   Patient Name:   Brandon Sanchez Date of Exam: 09/22/2019 Medical Rec #:  [id number]        Height:       75.0 in Accession #:    [id number]       Weight:       300.0 lb Date of Birth:  Dec 20, 1945        BSA:          2.61 m Patient Age:    73 years         BP:           98/73 mmHg Patient Gender: M                HR:           132 bpm. Exam Location:  Inpatient  Procedure: Transesophageal Echo, Color Doppler and Cardiac Doppler Indications:     atrial fibrillation  History:         Patient has prior history of Echocardiogram examinations, most                  recent 09/20/2019.  Sonographer:     Delcie RochLauren Pennington Referring Phys:  [id number] CADENCE H FURTH Diagnosing Phys: Epifanio Lescheshristopher Schumann MD  PROCEDURE: The TEE was followed by Cardioversion. Patients was monitored while under deep sedation. The transesophogeal probe was passed through the esophogus of the patient. The patient developed no complications during the procedure. IMPRESSIONS  1. No left atrial appendage thrombus.  2. Left ventricular ejection fraction, by visual estimation, is 50 to 55%. The left ventricle has low normal function. There is mildly increased left ventricular hypertrophy.  3. Global right ventricle has normal systolic function.The right ventricular size is normal.  4. The mitral valve is normal in structure. Mild mitral valve regurgitation.  5. The tricuspid valve is normal in structure. Tricuspid valve regurgitation is trivial.  6. The aortic valve is abnormal. Moderately calcified. Aortic valve regurgitation is mild.  7. The pulmonic valve was grossly normal. Pulmonic valve regurgitation is not visualized.  8. 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  9. Evidence of atrial level shunting detected by color flow Doppler. FINDINGS  Left Ventricle: Left ventricular ejection fraction, by visual estimation, is 50 to 55%. The left ventricle has  low normal function. The left ventricle has no regional wall motion abnormalities. There is mildly increased left ventricular hypertrophy. Right Ventricle: The right ventricular size is normal. No increase in right ventricular wall thickness. Global RV systolic function is has normal systolic function. Left Atrium: Left atrial size was mildly dilated. No left atrial appendage thrombus. Right Atrium: Right atrial size was mildly dilated Pericardium: There is no evidence of  pericardial effusion. Mitral Valve: The mitral valve is normal in structure. Mild mitral valve regurgitation. Tricuspid Valve: The tricuspid valve is normal in structure. Tricuspid valve regurgitation is trivial. Aortic Valve: The aortic valve is abnormal. Aortic valve regurgitation is mild. There is moderate calcification of the aortic valve. Pulmonic Valve: The pulmonic valve was grossly normal. Pulmonic valve regurgitation is not visualized. Aorta: Aortic dilatation noted. 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 Shunts: Evidence of atrial level shunting detected by color flow Doppler.  Epifanio Lesches MD Electronically signed by Epifanio Lesches MD Signature Date/Time: 09/22/2019/5:35:37 PM    Final     Microbiology: Recent Results (from the past 240 hour(s))  Respiratory Panel by RT PCR (Flu A&B, Covid) - Nasopharyngeal Swab     Status: None   Collection Time: 09/20/19 12:35 AM   Specimen: Nasopharyngeal Swab  Result Value Ref Range Status   SARS Coronavirus 2 by RT PCR NEGATIVE NEGATIVE Final    Comment: (NOTE) SARS-CoV-2 target nucleic acids are NOT DETECTED. The SARS-CoV-2 RNA is generally detectable in upper respiratoy specimens during the acute phase of infection. The lowest concentration of SARS-CoV-2 viral copies this assay can detect is 131 copies/mL. A negative result does not preclude SARS-Cov-2 infection and should not be used as the sole basis for treatment or other patient management  decisions. A negative result may occur with  improper specimen collection/handling, submission of specimen other than nasopharyngeal swab, presence of viral mutation(s) within the areas targeted by this assay, and inadequate number of viral copies (<131 copies/mL). A negative result must be combined with clinical observations, patient history, and epidemiological information. The expected result is Negative. Fact Sheet for Patients:  https://www.moore.com/ Fact Sheet for Healthcare Providers:  https://www.young.biz/ This test is not yet ap proved or cleared by the [place] FDA and  has been authorized for detection and/or diagnosis of SARS-CoV-2 by FDA under an Emergency Use Authorization (EUA). This EUA will remain  in effect (meaning this test can be used) for the duration of the COVID-19 declaration under Section 564(b)(1) of the Act, 21 U.S.C. section 360bbb-3(b)(1), unless the authorization is terminated or revoked sooner.    Influenza A by PCR NEGATIVE NEGATIVE Final   Influenza B by PCR NEGATIVE NEGATIVE Final    Comment: (NOTE) The Xpert Xpress SARS-CoV-2/FLU/RSV assay is intended as an aid in  the diagnosis of influenza from Nasopharyngeal swab specimens and  should not be used as a sole basis for treatment. Nasal washings and  aspirates are unacceptable for Xpert Xpress SARS-CoV-2/FLU/RSV  testing. Fact Sheet for Patients: https://www.moore.com/ Fact Sheet for Healthcare Providers: https://www.young.biz/ This test is not yet approved or cleared by the [place] FDA and  has been authorized for detection and/or diagnosis of SARS-CoV-2 by  FDA under an Emergency Use Authorization (EUA). This EUA will remain  in effect (meaning this test can be used) for the duration of the  Covid-19 declaration under Section 564(b)(1) of the Act, 21  U.S.C. section 360bbb-3(b)(1), unless the authorization  is  terminated or revoked. Performed at [facility], [address] [place], [place] [address]   Blood culture (routine x 2)     Status: None   Collection Time: 09/20/19  1:25 AM   Specimen: BLOOD RIGHT HAND  Result Value Ref Range Status   Specimen Description BLOOD RIGHT HAND  Final   Special Requests   Final    BOTTLES DRAWN AEROBIC AND ANAEROBIC Blood Culture adequate volume  Culture   Final    NO GROWTH 5 DAYS Performed at [facility], [address], [place], [place] [address]    Report Status 09/25/2019 FINAL  Final  Blood culture (routine x 2)     Status: None   Collection Time: 09/20/19  1:30 AM   Specimen: BLOOD LEFT WRIST  Result Value Ref Range Status   Specimen Description BLOOD LEFT WRIST  Final   Special Requests   Final    BOTTLES DRAWN AEROBIC AND ANAEROBIC Blood Culture results may not be optimal due to an excessive volume of blood received in culture bottles   Culture   Final    NO GROWTH 5 DAYS Performed at [facility], [address] [place], [place] [address]    Report Status 09/25/2019 FINAL  Final  MRSA PCR Screening     Status: None   Collection Time: 09/21/19  1:16 AM   Specimen: Nasal Mucosa; Nasopharyngeal  Result Value Ref Range Status   MRSA by PCR NEGATIVE NEGATIVE Final    Comment:        The GeneXpert MRSA Assay (FDA approved for NASAL specimens only), is one component of a comprehensive MRSA colonization surveillance program. It is not intended to diagnose MRSA infection nor to guide or monitor treatment for MRSA infections. Performed at [facility], [address] [place], [place] [address]      Labs: CBC: Recent Labs  Lab 09/20/19 0515 09/21/19 0250 09/22/19 0248 09/23/19 0231 09/24/19 0141  WBC 16.6* 17.0* 11.9* 9.3 7.7  HGB 12.4* 12.3* 11.6* 11.5* 11.0*  HCT 38.8* 38.7* 36.0* 36.6* 34.3*  MCV 98.2 97.5 96.8 99.2 97.7  PLT 204 237 196 208 206   Basic Metabolic Panel: Recent Labs  Lab  09/19/19 2342 09/20/19 0515 09/21/19 0250 09/22/19 0248 09/23/19 0231 09/24/19 0141  NA 142  --  138 143 144 143  K 3.7  --  4.3 3.6 4.1 4.1  CL 102  --  103 104 104 106  CO2 26  --  20* 24 26 26   GLUCOSE 132*  --  127* 81 142* 138*  BUN 29*  --  47* 56* 54* 41*  CREATININE 1.21  --  2.42* 2.21* 2.04* 1.67*  CALCIUM 9.8  --  9.5 8.9 9.2 9.0  MG  --  1.6* 2.4 2.3  --   --    Liver Function Tests: No results for input(s): AST, ALT, ALKPHOS, BILITOT, PROT, ALBUMIN in the last 168 hours. No results for input(s): LIPASE, AMYLASE in the last 168 hours. No results for input(s): AMMONIA in the last 168 hours. Cardiac Enzymes: No results for input(s): CKTOTAL, CKMB, CKMBINDEX, TROPONINI in the last 168 hours. BNP (last 3 results) Recent Labs    09/20/19 0515  BNP 289.1*   CBG: Recent Labs  Lab 09/23/19 1608 09/23/19 2114 09/24/19 0622 09/24/19 0746 09/24/19 1107  GLUCAP 138* 132* 129* 116* 137*    Time spent: 35 minutes  Signed:  11/22/19  [facility] 09/24/2019 4:46 PM

## 2019-09-27 ENCOUNTER — Telehealth: Payer: Self-pay | Admitting: Cardiology

## 2019-09-27 ENCOUNTER — Encounter: Payer: Self-pay | Admitting: *Deleted

## 2019-09-27 ENCOUNTER — Other Ambulatory Visit: Payer: Self-pay | Admitting: *Deleted

## 2019-09-27 NOTE — Telephone Encounter (Signed)
Pt c/o Shortness Of Breath: STAT if SOB developed within the last 24 hours or pt is noticeably SOB on the phone  1. Are you currently SOB (can you hear that pt is SOB on the phone)? Yes  2. How long have you been experiencing SOB? 2 to 3 weeks before hospital visit on 09/18/18  3. Are you SOB when sitting or when up moving around? Moving around  4. Are you currently experiencing any other symptoms? No   Patient's daughter is calling stating the patient has been experiencing excessive SOB when he moves around. No other symptoms occur besides the SOB, but she would like to know if the patient should be seen sooner than 10/01/19 in regards to it. Please advise.

## 2019-09-27 NOTE — Telephone Encounter (Signed)
I don't have the opportunity to bring him in sooner, but I agree with continuing to monitor. If he has a way to log heart rate and blood pressure, that will also be helpful, in addition to daily weights. If the heart rate is very elevated, it may be that he is back in afib. We will check an ECG when he comes in the office to follow up. Thank you.

## 2019-09-27 NOTE — Patient Outreach (Signed)
[facility] Customer service manager [facility]) Care Management  09/27/2019  Brandon Sanchez 1945-10-26 [id number]   RED ON EMMI ALERT - General Discharge Day # 1 Date: 2/7 Red Alert Reason: Other questions/problems   Outreach attempt #1, successful.   Referral received from care management assistant for above noted reason.  Member was admitted to hospital 1/31-2/5 for A-fib with RVR.  Per chart, he also his history of HTN, diabetes, asthma, and HLD.  Primary MD office will complete transition of care assessment.  Call placed to member to follow up on above noted concern, identity verified.  This care manager introduced self and stated purpose of call.  [facility] care management services explained, daughter Brandon Sanchez also present during conversation.  Member report he hs continued to have shortness of breath with minimal activity.  Denies feeling any chest pain or palpitations.  He lives alone but his daughter is in town, she will be going back home by the end of the week pending member's improvement.  They have already reached out to cardiology to report member's symptoms, waiting on call back.  He is on Lasix but has just taken his first dose today since discharge.  Member report he has Albuterol inhaler for his asthma, has not tried to use for the shortness of breath.  He will use it to see if it helps, also advised that restarting Lasix should also provide relief but to notify MD if shortness of breath continues.  Cardiology appointment scheduled for 2/12, hoping to have visit moved up.  They have also reached out to PCP office, awaiting call back for appointment.    Daughter concerned about member converting back to irregular rhythm and understanding how to manage condition.  They are open to receiving education via EMMI for management of A-fib.  This care manager inquired about member monitoring blood pressure and heart rate, state he does not currently, only monitors his blood sugar.  Advised to start checking  heart rate and BP daily.  She inquires about ability to start cardiac rehab, advised that this would have to be ordered by cardiology.  She will discuss during visit.  Medications reviewed with member, denies questions of need for financial assistance.    Member report he has been independent in his ADL's, however moving slower now since having shortness of breath.  He is still able to drive himself to appointments, daughter will drive and accompany him if she is still in town.  They deny any urgent concerns at this time, agree to follow up.   Plan: RN CM will follow up within the next 2 weeks.   [facility] CM Care Plan Problem One     Most Recent Value  Care Plan Problem One  Risk for hospitalization related to A-fib as evidenced by recent admission  Role Documenting the Problem One  Care Management Coordinator  Care Plan for Problem One  Active  [facility] Long Term Goal   Member will not be readmitted to hospital within the next 31 days  [facility] Long Term Goal Start Date  09/27/19  Interventions for Problem One Long Term Goal  Discharge instructions reviewed with member and daughter.  Educated on importance of following plan of care in effort to decrease risk of readmission. EMMI educattion related to A-Fib management assigned to member   [facility] CM Short Term Goal #1   Member will report checking blood pressure and heart rate daily over the next 4 weeks  [facility] CM Short Term Goal #1 Start Date  09/27/19  Interventions for Short Term Goal #1  Educated on need for daily monitoring in effort to manage HR and notify MD with changes and abnormal readings.    [facility] CM Short Term Goal #2   Member will keep and attend follow up appointmet with cardiology within the next week  [facility] CM Short Term Goal #2 Start Date  09/27/19  Interventions for Short Term Goal #2  Reviewed appointment date/time with member and daughter.  Assessed need for transportation assistance     Valente David, [place], MSN [place] [phone]

## 2019-09-27 NOTE — Telephone Encounter (Signed)
Pts daughter called to report the opt still has not had much relief from his SOB that he went into the hospital with on 09/19/19.Marland Kitchen He was d/c'd on Friday 09/24/19 and was told to hold his lasix until today due to abnormal kidney function due to IV lasix he was receiving in the hospital.   He is struggling with minimal activity.. he still lays flat, on his side, to sleep at night. He denies any increased edema outside of his "normal" edema... none in his hands or abdomen.   Pt has started back his lasix 80 mg in the morning and 40 mg at midday. He has only urinated once this morning and no relief as of yet.   His appt with Dr. Cristal Deer is 10/01/19... I advised her that she may want to see how he does now that he is able take the diuretic.   She would like to move the appt up if possible.   Will forward to Dr. Cristal Deer for review.

## 2019-09-27 NOTE — Telephone Encounter (Signed)
Patient's daughter called to discuss patient's fluctuating heart rate in the 70s up to 120.  She has been checking his heart rate with a pulse oximeter.  The patient is having shortness of breath with just walking through the house.  No shortness of breath at rest.  No chest pain or lightheadedness.  The patient is likely back in A. fib.  We discussed that it is acceptable for heart to fluctuate with atrial fibrillation and that the patient should be okay tonight as long as his heart rate is sustaining over 120 and he is not having shortness of breath at rest, chest discomfort or lightheadedness.  I advised to have the patient stay quiet tonight and try to get some rest.  We discussed that if he does develop any of the discussed symptoms, he should report to the emergency department.  His daughter felt good with this plan.  She will call the office tomorrow if he continues to have elevated heart rates and dyspnea on exertion. I will route this note to Dr. Cristal Deer and to the triage nurse at [facility].

## 2019-09-27 NOTE — Telephone Encounter (Signed)
Daughter updated and verbalized understanding.  

## 2019-09-28 ENCOUNTER — Other Ambulatory Visit: Payer: Self-pay

## 2019-09-28 ENCOUNTER — Other Ambulatory Visit (HOSPITAL_COMMUNITY): Payer: Self-pay | Admitting: Physician Assistant

## 2019-09-28 ENCOUNTER — Telehealth: Payer: Self-pay | Admitting: Cardiology

## 2019-09-28 ENCOUNTER — Ambulatory Visit (HOSPITAL_COMMUNITY)
Admission: RE | Admit: 2019-09-28 | Discharge: 2019-09-28 | Disposition: A | Discharge status: Home / Self Care | Payer: Medicare Other | Source: Ambulatory Visit | Attending: Physician Assistant | Admitting: Physician Assistant

## 2019-09-28 VITALS — BP 126/68 | HR 135 | Ht 75.0 in | Wt 326.4 lb

## 2019-09-28 DIAGNOSIS — Z7901 Long term (current) use of anticoagulants: Secondary | ICD-10-CM | POA: Insufficient documentation

## 2019-09-28 DIAGNOSIS — E669 Obesity, unspecified: Secondary | ICD-10-CM | POA: Insufficient documentation

## 2019-09-28 DIAGNOSIS — E785 Hyperlipidemia, unspecified: Secondary | ICD-10-CM | POA: Diagnosis not present

## 2019-09-28 DIAGNOSIS — I4819 Other persistent atrial fibrillation: Secondary | ICD-10-CM | POA: Diagnosis not present

## 2019-09-28 DIAGNOSIS — J449 Chronic obstructive pulmonary disease, unspecified: Secondary | ICD-10-CM | POA: Diagnosis not present

## 2019-09-28 DIAGNOSIS — D6869 Other thrombophilia: Secondary | ICD-10-CM | POA: Insufficient documentation

## 2019-09-28 DIAGNOSIS — I4891 Unspecified atrial fibrillation: Secondary | ICD-10-CM | POA: Diagnosis not present

## 2019-09-28 DIAGNOSIS — Z6841 Body Mass Index (BMI) 40.0 and over, adult: Secondary | ICD-10-CM | POA: Insufficient documentation

## 2019-09-28 DIAGNOSIS — Z79899 Other long term (current) drug therapy: Secondary | ICD-10-CM | POA: Insufficient documentation

## 2019-09-28 DIAGNOSIS — I10 Essential (primary) hypertension: Secondary | ICD-10-CM | POA: Insufficient documentation

## 2019-09-28 DIAGNOSIS — E118 Type 2 diabetes mellitus with unspecified complications: Secondary | ICD-10-CM | POA: Insufficient documentation

## 2019-09-28 LAB — COMPREHENSIVE METABOLIC PANEL
ALT: 33 U/L (ref 0–44)
AST: 22 U/L (ref 15–41)
Albumin: 3.3 g/dL — ABNORMAL LOW (ref 3.5–5.0)
Alkaline Phosphatase: 67 U/L (ref 38–126)
Anion gap: 10 (ref 5–15)
BUN: 21 mg/dL (ref 8–23)
CO2: 25 mmol/L (ref 22–32)
Calcium: 9.3 mg/dL (ref 8.9–10.3)
Chloride: 109 mmol/L (ref 98–111)
Creatinine, Ser: 1.39 mg/dL — ABNORMAL HIGH (ref 0.61–1.24)
GFR calc Af Amer: 58 mL/min — ABNORMAL LOW (ref >60)
GFR calc non Af Amer: 50 mL/min — ABNORMAL LOW (ref >60)
Glucose, Bld: 93 mg/dL (ref 70–99)
Potassium: 3.7 mmol/L (ref 3.5–5.1)
Sodium: 144 mmol/L (ref 135–145)
Total Bilirubin: 0.7 mg/dL (ref 0.3–1.2)
Total Protein: 7 g/dL (ref 6.5–8.1)

## 2019-09-28 LAB — TSH: TSH: 2.357 u[IU]/mL (ref 0.350–4.500)

## 2019-09-28 MED ORDER — DILTIAZEM HCL ER COATED BEADS 120 MG PO CP24: 120.0000 mg | ORAL_CAPSULE | Freq: Every day | ORAL | 3 refills | Status: DC

## 2019-09-28 MED ORDER — AMIODARONE HCL 200 MG PO TABS: ORAL_TABLET | ORAL | 0 refills | Status: DC

## 2019-09-28 NOTE — Telephone Encounter (Signed)
Patient c/o Palpitations:  High priority if patient c/o lightheadedness, shortness of breath, or chest pain  1) How long have you had palpitations/irregular HR/ Afib? Are you having the symptoms now? Since last night 09/27/19, still having symptoms right now that are not as severe   2) Are you currently experiencing lightheadedness, SOB or CP? No   3) Do you have a history of afib (atrial fibrillation) or irregular heart rhythm? Yes   4) Have you checked your BP or HR? (document readings if available):  09/27/19 98/54 HR 87       09/28/19 HR 60-120   5) Are you experiencing any other symptoms? SOB when moving around  Patient's daughter is calling stating the patient has been experiencing Afib since last night. She states no other symptoms occur during the Afib, but the patient is experiencing SOB anytime he moves around. Patient has an appointment scheduled for 10/01/19 with Dr. Cristal Deer, but was advised last night to callback this morning if symptoms are still occurring so a sooner appointment can be scheduled if necessary. Please advise.

## 2019-09-28 NOTE — Progress Notes (Signed)
Primary Care Physician: Merri Brunette, MD Primary Cardiologist: Dr Cristal Deer Primary Electrophysiologist: none Referring Physician: Dr Fletcher Anon is a 74 y.o. male with a history of asthma, type II diabetes, dyslipidemia, hypertension, and persistent atrial fibrillation who presents for consultation in the [facility] Atrial Fibrillation Clinic.  The patient was initially diagnosed with atrial fibrillation on 09/20/19 after presenting with symptoms of worsening fatigue and SOB. He was found to be in afib with RVR in the setting of sepsis from CAP. Patient was started on Eliquis for a CHADS2VASC score of 3. He underwent TEE/DCCV on 09/22/19. Echo showed preserved EF 50-55% with mildly dilated LA. Since his admission, patient reports that he has had elevated heart rates at times associated with SOB. He is in afib today with RVR. He does admit to drinking 2-3 glasses of wine daily prior to his hospitalization. He also states that he snores and frequently naps during the day.  Today, he denies symptoms of chest pain, orthopnea, PND, dizziness, presyncope, syncope, bleeding, or neurologic sequela. The patient is tolerating medications without difficulties and is otherwise without complaint today.    Atrial Fibrillation Risk Factors:  he does have symptoms of sleep apnea. he does not have a history of rheumatic fever. he does have a history of alcohol use. The patient does not have a history of early familial atrial fibrillation or other arrhythmias.  he has a BMI of Body mass index is 40.8 kg/m.Marland Kitchen Filed Weights   09/28/19 1355  Weight: (!) 148.1 kg    Family History  Problem Relation Age of Onset  . Colon cancer Father   . Heart disease Paternal Grandfather      Atrial Fibrillation Management history:  Previous antiarrhythmic drugs: none Previous cardioversions: 09/22/19 Previous ablations: none CHADS2VASC score: 3 Anticoagulation history: Eliquis   Past  Medical History:  Diagnosis Date  . Allergic rhinitis, cause unspecified   . Asthma   . Other and unspecified hyperlipidemia   . Type II or unspecified type diabetes mellitus without mention of complication, not stated as uncontrolled   . Unspecified essential hypertension    Past Surgical History:  Procedure Laterality Date  . CARDIOVERSION N/A 09/22/2019   Procedure: CARDIOVERSION;  Surgeon: [name], MD;  Location[facility] ENDOSCOPY;  Service: Endoscopy;  Laterality: N/A;  . TEE WITHOUT CARDIOVERSION N/A 09/22/2019   Procedure: TRANSESOPHAGEAL ECHOCARDIOGRAM (TEE);  Surgeon: [name], MD;  Location: [facility] ENDOSCOPY;  Service: Endoscopy;  Laterality: N/A;    Current Outpatient Medications  Medication Sig Dispense Refill  . acetaminophen (TYLENOL) 500 MG tablet Take by mouth as needed.     Marland Kitchen albuterol (PROAIR HFA) 108 (90 BASE) MCG/ACT inhaler 2 puffs every 4 hours as needed only  if your can't catch your breath 1 Inhaler 1  . apixaban (ELIQUIS) 5 MG TABS tablet Take 1 tablet (5 mg total) by mouth 2 (two) times daily. 60 tablet 0  . atorvastatin (LIPITOR) 40 MG tablet Take 40 mg by mouth daily.      Marland Kitchen BREO ELLIPTA 200-25 MCG/INH AEPB Inhale 1 puff into the lungs daily.    . cetirizine-pseudoephedrine (ZYRTEC-D) 5-120 MG per tablet Take 1 tablet by mouth as needed for allergies.     . furosemide (LASIX) 40 MG tablet Take 1 tablet (40 mg total) by mouth 2 (two) times daily. 2 tablets in AM and 1 tablet midday. (Patient taking differently: Take 40 mg by mouth daily. Taking 2 tablets in the am  and 1 tablet in the pm) 30 tablet 0  . Glucosamine-Chondroit-Vit C-Mn (GLUCOSAMINE CHONDR 1500 COMPLX) CAPS Take 1 capsule by mouth 2 (two) times daily.    . insulin aspart protamine- aspart (NOVOLOG MIX 70/30) (70-30) 100 UNIT/ML injection Inject 40 Units into the skin 2 (two) times daily at 8 am and 10 pm.    . metoprolol succinate (TOPROL-XL) 200 MG 24 hr tablet Take 1 tablet (200  mg total) by mouth daily. Take with or immediately following a meal. 30 tablet 0  . Multiple Vitamin (MULTIVITAMIN WITH MINERALS) TABS tablet Take 1 tablet by mouth daily.    Marland Kitchen omeprazole (PRILOSEC) 40 MG capsule Take 40 mg by mouth daily as needed (heart burn).     Glory Rosebush VERIO test strip USE AS DIRECTED TO TEST BLOOD SUGAR THREE TIMES DAILY FOR 30 DAYS    . PREVIDENT 5000 BOOSTER PLUS 1.1 % PSTE USE AT NIGHT IN PLACE OF REGULAR TOOTHPASTE AND DO NOT EAT OR DRINK ANYTHING AFTERWARDS    . sertraline (ZOLOFT) 100 MG tablet Take 100 mg by mouth daily.    Marland Kitchen amiodarone (PACERONE) 200 MG tablet TAKE 1 TABLET BY MOUTH TWICE DAILY FOR 1 MONTH, THEN REDUCE TO 1 TABLET BY MOUTH EVERY DAY 60 tablet 0  . BESIVANCE 0.6 % SUSP Place 1 drop into the left eye 3 (three) times daily.    Marland Kitchen diltiazem (CARDIZEM CD) 120 MG 24 hr capsule Take 1 capsule (120 mg total) by mouth daily. 30 capsule 3  . DUREZOL 0.05 % EMUL Place 1 drop into the left eye 3 (three) times daily.    Marland Kitchen PROLENSA 0.07 % SOLN Place 1 drop into the left eye at bedtime.     No current facility-administered medications for this encounter.    No Known Allergies  Social History   Socioeconomic History  . Marital status: Divorced    Spouse name: Not on file  . Number of children: Not on file  . Years of education: Not on file  . Highest education level: Not on file  Occupational History  . Occupation: Counsellor  Tobacco Use  . Smoking status: Never Smoker  . Smokeless tobacco: Never Used  Substance and Sexual Activity  . Alcohol use: Yes    Alcohol/week: 3.0 standard drinks    Types: 3 Glasses of wine per week  . Drug use: Not on file  . Sexual activity: Not on file  Other Topics Concern  . Not on file  Social History Narrative  . Not on file   Social Determinants of Health   Financial Resource Strain:   . Difficulty of Paying Living Expenses: Not on file  Food Insecurity: No Food Insecurity  . Worried About Paediatric nurse in the Last Year: Never true  . Ran Out of Food in the Last Year: Never true  Transportation Needs: No Transportation Needs  . Lack of Transportation (Medical): No  . Lack of Transportation (Non-Medical): No  Physical Activity:   . Days of Exercise per Week: Not on file  . Minutes of Exercise per Session: Not on file  Stress:   . Feeling of Stress : Not on file  Social Connections:   . Frequency of Communication with Friends and Family: Not on file  . Frequency of Social Gatherings with Friends and Family: Not on file  . Attends Religious Services: Not on file  . Active Member of Clubs or Organizations: Not on file  . Attends Club  or Organization Meetings: Not on file  . Marital Status: Not on file  Intimate Partner Violence:   . Fear of Current or Ex-Partner: Not on file  . Emotionally Abused: Not on file  . Physically Abused: Not on file  . Sexually Abused: Not on file     ROS- All systems are reviewed and negative except as per the HPI above.  Physical Exam: Vitals:   09/28/19 1355  BP: 126/68  Pulse: (!) 135  SpO2: 94%  Weight: (!) 148.1 kg  Height: 6\' 3"  (1.905 m)    GEN- The patient is well appearing obese male, alert and oriented x 3 today.   Head- normocephalic, atraumatic Eyes-  Sclera clear, conjunctiva pink Ears- hearing intact Oropharynx- clear Neck- supple  Lungs- Clear to ausculation bilaterally, normal work of breathing Heart- irregular rate and rhythm, tachycardic, no murmurs, rubs or gallops  GI- soft, NT, ND, + BS Extremities- no clubbing, cyanosis. Non pitting edema MS- no significant deformity or atrophy Skin- no rash or lesion Psych- euthymic mood, full affect Neuro- strength and sensation are intact  Wt Readings from Last 3 Encounters:  09/28/19 (!) 148.1 kg  09/22/19 136.1 kg  07/08/16 136.1 kg    EKG today demonstrates afib HR 135, PVC, LAFB, QRS 98, QTc 393  Echo 09/20/19 demonstrated  1. Left ventricular ejection  fraction, by visual estimation, is 50 to  55%. The left ventricle has normal function. There is mildly increased  left ventricular hypertrophy.  2. Definity contrast agent was given IV to delineate the left ventricular  endocardial borders.  3. Left ventricular diastolic function could not be evaluated.  4. Mildly dilated left ventricular internal cavity size.  5. The left ventricle has no regional wall motion abnormalities.  6. Global right ventricle has normal systolic function.The right  ventricular size is mildly enlarged.  7. Left atrial size was mildly dilated.  8. Right atrial size was mildly dilated.  9. Mild mitral annular calcification.  10. Trivial mitral valve regurgitation. No evidence of mitral stenosis.  11. The tricuspid valve is normal in structure. Tricuspid valve  regurgitation is trivial.  12. The aortic valve was not well visualized. Aortic valve regurgitation  is not visualized. Mild to moderate aortic valve stenosis.  13. The pulmonic valve was not well visualized. Pulmonic valve  regurgitation is not visualized.  14. The inferior vena cava is dilated in size with <50% respiratory  variability, suggesting right atrial pressure of 15 mmHg.  15. Technically difficult; definity used; low normal LV systolic function;  mild LVH; mild LVE; calcified aortic valve with mild to moderate AS (mean  gradient 19 mmHg); mild LAE/RAE/RVE.   Epic records are reviewed at length today  CHA2DS2-VASc Score = 3 The patient's score is based upon: CHF History: No HTN History: Yes Age : 61-74 Diabetes History: Yes Stroke History: No Vascular Disease History: No Gender: Male      ASSESSMENT AND PLAN: 1. Persistent Atrial Fibrillation (ICD10:  I48.19) The patient's CHA2DS2-VASc score is 3, indicating a 3.2% annual risk of stroke.   S/p TEE/DCCV on 09/22/19. Unfortunately he has had ERAF. We discussed therapeutic options today including AAD therapy and repeat DCCV. After  discussing the risks and benefits, will plan to start amiodarone 200 mg BID for 4 weeks then decrease to once daily. If he does not convert to SR with medication, will plan repeat DCCV after loading on amiodarone. Check TSH, Cmet today. Will start diltiazem 120 mg daily for rate control.  Continue Eliquis 5 mg BID Continue Toprol 200 mg daily  2. Secondary Hypercoagulable State (ICD10:  D68.69) The patient is at significant risk for stroke/thromboembolism based upon his CHA2DS2-VASc Score of 3.  Continue Apixaban (Eliquis).   3. Obesity Body mass index is 40.8 kg/m. Lifestyle modification was discussed at length including regular exercise and weight reduction. Patient admits he is very sedentary.   4. Suspected Obstructive sleep apnea The importance of adequate treatment of sleep apnea was discussed today in order to improve our ability to maintain sinus rhythm long term. Patient admits to snoring and frequent napping during the day. Will refer for sleep study.  5. HTN Stable, med changes as above.   Follow up with Dr Cristal Deer as scheduled. AF clinic in 2-3 weeks.   Jorja Loa PA-C Afib Clinic [facility] [address] [place], [place] [address] [phone] 09/28/2019 4:34 PM

## 2019-09-28 NOTE — Telephone Encounter (Signed)
Returned call to daughter-daughter states patient continues to have elevated HR with shortness of breath on exertion (see previous telephone notes).   She states BP this AM 132/114 HR 140, but pulse ox is ranging 60-120.   He denies chest pain, palpitations, lightheadedness, dizziness.    He has not taken his AM medication but daughter is about to give it to him.    Scheduled patient to be seen today in Afib Clinic at 2pm.  Location and code provided to daughter.  Advised to take AM meds, if patient symptoms worsen proceed to ER for eval/treatment.    Daughter agreed with plan and verbalized understanding.

## 2019-09-28 NOTE — Patient Instructions (Signed)
Start cardizem 120mg  once a day  Start Amiodarone 200mg  twice a day with food for 1 month then reduce to 200mg  a day  Scheduling for sleep study will contact you after approval from your insurance company.

## 2019-09-29 ENCOUNTER — Telehealth: Payer: Self-pay | Admitting: *Deleted

## 2019-09-29 ENCOUNTER — Telehealth: Payer: Self-pay | Admitting: Cardiology

## 2019-09-29 NOTE — Telephone Encounter (Signed)
This encounter was created in error - please disregard.

## 2019-09-29 NOTE — Telephone Encounter (Signed)
Pt c/o medication issue:  1. Name of Medication: diltiazem (CARDIZEM CD) 120 MG 24 hr capsule  2. How are you currently taking this medication (dosage and times per day)? 1 capsule by mouth daily   3. Are you having a reaction (difficulty breathing--STAT)? No   4. What is your medication issue? Patient's daughter is calling stating the patient started the listed medication yesterday to lower his HR. She states when she checked the HR and BP today it was 104/47 HR 49 and she just wanted to clarify that is not to low and he should continue taking medication. Please advise.

## 2019-09-29 NOTE — Telephone Encounter (Signed)
Incoming call from scheduling. Call transferred to triage nurse. Pt verbalized consent for triage nurse to speak with his daughter. She states pt started on amiodarone and diltiazem yesterday d/t HR in 140s and pt HR has steadily slowed and went down to 90s then 70s but it was last checked today and reading was 49 bpm along with BP of 104/47. Reviewed current cardiac meds. She confirms that pt is on all cardiac meds as prescribed. She states pt has ongoing SOB, but he woke up from a nap earlier today and told her that he felt that he really needed to deep breathe to get air in his lungs and that this was different from his SOB. She states pt said he feels fine now; no other symptoms. She would like Dr. Cristal Deer to advise. Informed pt daughter that Dr. Cristal Deer not currently in the office but triage nurse will consult with DOD Dr. Royann Shivers and call back.   Consulted Dr. Royann Shivers. Recommendation is for pt to stop taking diltiazem and continue on amiodarone.   Called pt daughter back. Advised of recommendation and she verbalized understanding. She states she just checked pt vitals and BP 94/57 and HR 70. She states she was told by a provider to monitor pt for SBP below 90. Advised her to continue monitoring vitals twice a day and monitoring pt for increase in severity of symptoms or new symptoms between now and 2/12 appt with Dr. Cristal Deer and can call back during hours of operation or after hours to speak with an on-call provider.

## 2019-09-29 NOTE — Telephone Encounter (Signed)
   [facility] Pre-operative Risk Assessment    Request for surgical clearance:  1. What type of surgery is being performed? CATARACT EXTRACTION w/INTRAOCULAR LENS IMPLANT OF THE RIGHT EYE FOLLOWED BY LEFT    2. When is this surgery scheduled? 11/05/19   3. What type of clearance is required (medical clearance vs. Pharmacy clearance to hold med vs. Both)? MEDICAL  4. Are there any medications that need to be held prior to surgery and how long? DO NOT NEED TO HOLD ANY MEDICATIONS   5. Practice name and name of physician performing surgery? [facility]; DR. Christia Reading BEVIS  6. What is your office phone number [phone]    7.   What is your office fax number [phone]  8.   Anesthesia type (None, local, MAC, general) ? TOPICAL w/IV MEDICATION   Julaine Hua 09/29/2019, 2:28 PM  _________________________________________________________________   (provider comments below)

## 2019-09-29 NOTE — Telephone Encounter (Signed)
[id number] 

## 2019-09-29 NOTE — Telephone Encounter (Signed)
   Primary Cardiologist: Jodelle Red, MD  Chart reviewed as part of pre-operative protocol coverage. Cataract extractions are recognized in guidelines as low risk surgeries that do not typically require specific preoperative testing or holding of blood thinner therapy. However, patient was seen yesterday in Atrial Fibrillation clinic and was noted to be in atrial fibrillation with RVR. Amiodarone and Diltiazem were started with plans for repeat DCCV after loading with Amiodarone if patient does not convert to sinus rhythm.   Cataract surgery is not scheduled until 11/05/2019 and patient is scheduled to see Dr. Cristal Deer on 10/01/2019. Therefore, pre-operative assessment can be addressed at that time.   Will route request form to Dr. Cristal Deer so that she is aware and remove from pre-op pool.   Corrin Parker, PA-C 09/29/2019, 5:13 PM

## 2019-09-29 NOTE — Telephone Encounter (Signed)
Patient notified of sleep study and COVID appointments and details including quarantine instructions.

## 2019-10-01 ENCOUNTER — Telehealth: Payer: Self-pay | Admitting: *Deleted

## 2019-10-01 ENCOUNTER — Other Ambulatory Visit: Payer: Self-pay

## 2019-10-01 ENCOUNTER — Encounter: Payer: Self-pay | Admitting: Cardiology

## 2019-10-01 ENCOUNTER — Ambulatory Visit (INDEPENDENT_AMBULATORY_CARE_PROVIDER_SITE_OTHER): Payer: Medicare Other | Admitting: Cardiology

## 2019-10-01 VITALS — BP 110/60 | HR 140

## 2019-10-01 DIAGNOSIS — R600 Localized edema: Secondary | ICD-10-CM | POA: Diagnosis not present

## 2019-10-01 DIAGNOSIS — I5031 Acute diastolic (congestive) heart failure: Secondary | ICD-10-CM

## 2019-10-01 DIAGNOSIS — I4891 Unspecified atrial fibrillation: Secondary | ICD-10-CM | POA: Diagnosis not present

## 2019-10-01 DIAGNOSIS — R0602 Shortness of breath: Secondary | ICD-10-CM | POA: Diagnosis not present

## 2019-10-01 DIAGNOSIS — Z09 Encounter for follow-up examination after completed treatment for conditions other than malignant neoplasm: Secondary | ICD-10-CM | POA: Diagnosis not present

## 2019-10-01 NOTE — Telephone Encounter (Signed)
[facility] Home Visit Initial Request  Date of Request (DOR):  October 01, 2019  Requesting Provider:  DR. Cristal Deer    Agency Requested:    [facility] Contact:  Dara Hoyer, RN, CCM [address] [place], [place]  [address] [email or web address]  Phone #: [phone] Fax #: [phone]  Patient Demographic Information: Name:  Brandon Sanchez Age:  74 y.o.   DOB:  1945-11-25  MRN:  [id number]   Address:   [address] [place] [address]   Phone Numbers:   Home Phone [phone]  Mobile [phone]     Emergency Contact Information on File:   Contact Information    Name Relation Home Work Mobile   Radwan, Cowley Daughter [phone]  [phone]      The above family members may be contacted for information on this patient (review DPR on file):  No    Patient Clinical Information:  Primary Care Provider:  Merri Brunette, MD  Primary Cardiologist:  Jodelle Red, MD  Primary Electrophysiologist:  None   Past Medical Hx: [name]  has a past medical history of Allergic rhinitis, cause unspecified, Asthma, Other and unspecified hyperlipidemia, Type II or unspecified type diabetes mellitus without mention of complication, not stated as uncontrolled, and Unspecified essential hypertension.   Allergies: He has No Known Allergies.   Medications: Current Outpatient Medications on File Prior to Visit  Medication Sig  . acetaminophen (TYLENOL) 500 MG tablet Take by mouth as needed.   Marland Kitchen albuterol (PROAIR HFA) 108 (90 BASE) MCG/ACT inhaler 2 puffs every 4 hours as needed only  if your can't catch your breath  . amiodarone (PACERONE) 200 MG tablet TAKE 1 TABLET BY MOUTH TWICE DAILY FOR 1 MONTH, THEN REDUCE TO 1 TABLET BY MOUTH EVERY DAY  . apixaban (ELIQUIS) 5 MG TABS tablet Take 1 tablet (5 mg total) by mouth 2 (two) times daily.  Marland Kitchen atorvastatin (LIPITOR) 40 MG tablet Take 40 mg by  mouth daily.    Marland Kitchen BESIVANCE 0.6 % SUSP Place 1 drop into the left eye 3 (three) times daily.  Marland Kitchen BREO ELLIPTA 200-25 MCG/INH AEPB Inhale 1 puff into the lungs daily.  . cetirizine-pseudoephedrine (ZYRTEC-D) 5-120 MG per tablet Take 1 tablet by mouth as needed for allergies.   Marland Kitchen diltiazem (CARDIZEM CD) 120 MG 24 hr capsule Take 1 capsule (120 mg total) by mouth daily.  . DUREZOL 0.05 % EMUL Place 1 drop into the left eye 3 (three) times daily.  . furosemide (LASIX) 40 MG tablet Take 1 tablet (40 mg total) by mouth 2 (two) times daily. 2 tablets in AM and 1 tablet midday. (Patient taking differently: Take 40 mg by mouth daily. Taking 2 tablets in the am and 1 tablet in the pm)  . Glucosamine-Chondroit-Vit C-Mn (GLUCOSAMINE CHONDR 1500 COMPLX) CAPS Take 1 capsule by mouth 2 (two) times daily.  . insulin aspart protamine- aspart (NOVOLOG MIX 70/30) (70-30) 100 UNIT/ML injection Inject 40 Units into the skin 2 (two) times daily at 8 am and 10 pm.  . metoprolol succinate (TOPROL-XL) 200 MG 24 hr tablet Take 1 tablet (200 mg total) by mouth daily. Take with or immediately following a meal.  . Multiple Vitamin (MULTIVITAMIN WITH MINERALS) TABS tablet Take 1 tablet by mouth daily.  Marland Kitchen omeprazole (PRILOSEC) 40 MG capsule Take 40 mg by mouth daily as needed (heart burn).   Letta Pate VERIO test strip USE AS DIRECTED TO TEST  BLOOD SUGAR THREE TIMES DAILY FOR 30 DAYS  . PREVIDENT 5000 BOOSTER PLUS 1.1 % PSTE USE AT NIGHT IN PLACE OF REGULAR TOOTHPASTE AND DO NOT EAT OR DRINK ANYTHING AFTERWARDS  . PROLENSA 0.07 % SOLN Place 1 drop into the left eye at bedtime.  . sertraline (ZOLOFT) 100 MG tablet Take 100 mg by mouth daily.   No current facility-administered medications on file prior to visit.     Social Hx: He  reports that he has never smoked. He has never used smokeless tobacco. He reports current alcohol use of about 3.0 standard drinks of alcohol per week.    Diagnosis/Reason for Visit:   A-FIB/VITAL  AND MED REC  Services Requested:  Vital Signs (BP, Pulse, O2, Weight)  Physical Exam  Medication Reconciliation  # of Visits Needed/Frequency per Week: 5 VISITS/NEED ASAP

## 2019-10-01 NOTE — Patient Instructions (Signed)
Medication Instructions:  Restart Diltiazem 120 mg daily  *If you need a refill on your cardiac medications before your next appointment, please call your pharmacy*  Lab Work: None  Testing/Procedures: None  Follow-Up: At [facility], you and your health needs are our priority.  As part of our continuing mission to provide you with exceptional heart care, we have created designated Provider Care Teams.  These Care Teams include your primary Cardiologist (physician) and Advanced Practice Providers (APPs -  Physician Assistants and Nurse Practitioners) who all work together to provide you with the care you need, when you need it.  Your next appointment:   1 month(s)  The format for your next appointment:   In Person  Provider:   Jodelle Red, MD

## 2019-10-01 NOTE — Progress Notes (Signed)
Cardiology Office Note:    Date:  10/01/2019   ID:  DAKIN MADANI, DOB 1946/02/24, MRN [id number]  PCP:  Merri Brunette, MD  Cardiologist:  Jodelle Red, MD  Referring MD: Merri Brunette, MD   CC: seven day post hospital discharge follow up.  History of Present Illness:    LEHI PHIFER is a 74 y.o. male with a hx of asthma, type II diabetes, hypertension, hyperlipidemia, recent diagnosis of atrial fibrillation who is seen for post hospital follow up. He was discharged on 09/24/19, and this is his 7 day TOC visit. His hospital course, discharge summary, and medications reviewed in depth.  Cardiac history: admitted 09/19/19 with atrial fibrillation with RVR. Has known history of type II diabetes, hypertension, and hyperlipidemia. He was also treated for community acquired pneumonia during that admission. He was started on anticoagulation, and rate control was attempted. Course was complicated by hypotension. He underwent TEE-CV with initial return to sinus rhythm. However after discharge, he returned to afib with RVR.   Today: Discussed with his daughter Seena Face via speakerphone.  Feels short of breath, he is in afib at 140 bpm again this morning. He does not recall a heart rate of 49 bpm but this is noted in a phone message. Checking with both blood pressure cuff and pulse oximeter. Neither has registered a heart beat more than 100 bpm.  He was instructed to stop diltiazem via phone encounter 09/29/19 after reading of 49 bpm.  Feels short of breath,especially at night. Weight is stable, no increase in LE edema. Taking 80 mg furosemide in the AM and 40 mg in the mid-day. Feels he is making good urine on this.  O2 sat 96 today in the office. No lows at home, all have been in the 90s.   He lives by himself, daughter has been staying with him but planning to go back to her home in [place]. Hasn't spoken with Dr. Renne Crigler yet since being out of the hospital. Cr continues to improve  since discharge, most recently 1.39 on 09/28/19.   We discussed management options at length today, see summary below. Also discussed sleep study, cataract surgery, home health, options for medications today.  Denies chest pain, PND, orthopnea, syncope or palpitations.  Past Medical History:  Diagnosis Date  . Allergic rhinitis, cause unspecified   . Asthma   . Other and unspecified hyperlipidemia   . Type II or unspecified type diabetes mellitus without mention of complication, not stated as uncontrolled   . Unspecified essential hypertension     Past Surgical History:  Procedure Laterality Date  . CARDIOVERSION N/A 09/22/2019   Procedure: CARDIOVERSION;  Surgeon: [name], MD;  Location: [facility] ENDOSCOPY;  Service: Endoscopy;  Laterality: N/A;  . TEE WITHOUT CARDIOVERSION N/A 09/22/2019   Procedure: TRANSESOPHAGEAL ECHOCARDIOGRAM (TEE);  Surgeon: [name], MD;  Location: [facility] ENDOSCOPY;  Service: Endoscopy;  Laterality: N/A;    Current Medications: Current Outpatient Medications on File Prior to Visit  Medication Sig  . acetaminophen (TYLENOL) 500 MG tablet Take by mouth as needed.   Marland Kitchen albuterol (PROAIR HFA) 108 (90 BASE) MCG/ACT inhaler 2 puffs every 4 hours as needed only  if your can't catch your breath  . amiodarone (PACERONE) 200 MG tablet TAKE 1 TABLET BY MOUTH TWICE DAILY FOR 1 MONTH, THEN REDUCE TO 1 TABLET BY MOUTH EVERY DAY  . apixaban (ELIQUIS) 5 MG TABS tablet Take 1 tablet (5 mg total) by mouth 2 (two) times daily.  Marland Kitchen  atorvastatin (LIPITOR) 40 MG tablet Take 40 mg by mouth daily.    Marland Kitchen BESIVANCE 0.6 % SUSP Place 1 drop into the left eye 3 (three) times daily.  Marland Kitchen BREO ELLIPTA 200-25 MCG/INH AEPB Inhale 1 puff into the lungs daily.  . cetirizine-pseudoephedrine (ZYRTEC-D) 5-120 MG per tablet Take 1 tablet by mouth as needed for allergies.   Marland Kitchen diltiazem (CARDIZEM CD) 120 MG 24 hr capsule Take 1 capsule (120 mg total) by mouth daily.  . DUREZOL 0.05 %  EMUL Place 1 drop into the left eye 3 (three) times daily.  . furosemide (LASIX) 40 MG tablet Take 1 tablet (40 mg total) by mouth 2 (two) times daily. 2 tablets in AM and 1 tablet midday. (Patient taking differently: Take 40 mg by mouth daily. Taking 2 tablets in the am and 1 tablet in the pm)  . Glucosamine-Chondroit-Vit C-Mn (GLUCOSAMINE CHONDR 1500 COMPLX) CAPS Take 1 capsule by mouth 2 (two) times daily.  . insulin aspart protamine- aspart (NOVOLOG MIX 70/30) (70-30) 100 UNIT/ML injection Inject 40 Units into the skin 2 (two) times daily at 8 am and 10 pm.  . metoprolol succinate (TOPROL-XL) 200 MG 24 hr tablet Take 1 tablet (200 mg total) by mouth daily. Take with or immediately following a meal.  . Multiple Vitamin (MULTIVITAMIN WITH MINERALS) TABS tablet Take 1 tablet by mouth daily.  Marland Kitchen omeprazole (PRILOSEC) 40 MG capsule Take 40 mg by mouth daily as needed (heart burn).   Glory Rosebush VERIO test strip USE AS DIRECTED TO TEST BLOOD SUGAR THREE TIMES DAILY FOR 30 DAYS  . PREVIDENT 5000 BOOSTER PLUS 1.1 % PSTE USE AT NIGHT IN PLACE OF REGULAR TOOTHPASTE AND DO NOT EAT OR DRINK ANYTHING AFTERWARDS  . PROLENSA 0.07 % SOLN Place 1 drop into the left eye at bedtime.  . sertraline (ZOLOFT) 100 MG tablet Take 100 mg by mouth daily.   No current facility-administered medications on file prior to visit.     Allergies:   Patient has no known allergies.   Social History   Tobacco Use  . Smoking status: Never Smoker  . Smokeless tobacco: Never Used  Substance Use Topics  . Alcohol use: Yes    Alcohol/week: 3.0 standard drinks    Types: 3 Glasses of wine per week  . Drug use: Not on file    Family History: family history includes Colon cancer in his father; Heart disease in his paternal grandfather.  ROS:   Please see the history of present illness.  Additional pertinent ROS negative except as noted in HPI   EKGs/Labs/Other Studies Reviewed:    The following studies were reviewed  today: Echo 09/20/19 1. Left ventricular ejection fraction, by visual estimation, is 50 to  55%. The left ventricle has normal function. There is mildly increased  left ventricular hypertrophy.  2. Definity contrast agent was given IV to delineate the left ventricular  endocardial borders.  3. Left ventricular diastolic function could not be evaluated.  4. Mildly dilated left ventricular internal cavity size.  5. The left ventricle has no regional wall motion abnormalities.  6. Global right ventricle has normal systolic function.The right  ventricular size is mildly enlarged.  7. Left atrial size was mildly dilated.  8. Right atrial size was mildly dilated.  9. Mild mitral annular calcification.  10. Trivial mitral valve regurgitation. No evidence of mitral stenosis.  11. The tricuspid valve is normal in structure. Tricuspid valve  regurgitation is trivial.  12. The aortic valve  was not well visualized. Aortic valve regurgitation  is not visualized. Mild to moderate aortic valve stenosis.  13. The pulmonic valve was not well visualized. Pulmonic valve  regurgitation is not visualized.  14. The inferior vena cava is dilated in size with <50% respiratory  variability, suggesting right atrial pressure of 15 mmHg.  15. Technically difficult; definity used; low normal LV systolic function;  mild LVH; mild LVE; calcified aortic valve with mild to moderate AS (mean  gradient 19 mmHg); mild LAE/RAE/RVE.   EKG:  EKG is personally reviewed.  The ekg ordered today demonstrates atrial fibrillation in RVR at 140 bpm  Recent Labs: 09/20/2019: B Natriuretic Peptide 289.1 09/22/2019: Magnesium 2.3 09/24/2019: Hemoglobin 11.0; Platelets 206 09/28/2019: ALT 33; BUN 21; Creatinine, Ser 1.39; Potassium 3.7; Sodium 144; TSH 2.357  Recent Lipid Panel No results found for: CHOL, TRIG, HDL, CHOLHDL, VLDL, LDLCALC, LDLDIRECT  Physical Exam:    VS:  BP 110/60   Pulse (!) 140   SpO2 96%     Wt  Readings from Last 3 Encounters:  09/28/19 (!) 326 lb 6.4 oz (148.1 kg)  09/22/19 300 lb (136.1 kg)  07/08/16 300 lb (136.1 kg)    GEN: Well nourished, well developed in no acute distress HEENT: Normal, moist mucous membranes NECK: No visible JVD CARDIAC: tachycardic, irregular rhythm, normal S1 and S2, no rubs or gallops. No murmurs. VASCULAR: Radial and DP pulses 2+ bilaterally. No carotid bruits RESPIRATORY:  Clear to auscultation in upper fields with scant, fine basilar rales ABDOMEN: Soft, non-tender, non-distended MUSCULOSKELETAL:  Ambulates independently SKIN: Warm and dry, bilateral 1+ nonpitting edema NEUROLOGIC:  Alert and oriented x 3. No focal neuro deficits noted. PSYCHIATRIC:  Normal affect    ASSESSMENT:    1. Atrial fibrillation with rapid ventricular response (HCC)   2. Shortness of breath   3. Bilateral leg edema   4. Hospital discharge follow-up   5. Acute diastolic heart failure (HCC)    PLAN:    Atrial fibrillation with RVR: shortness of breath, LE edema likely volume overload from acute diastolic heart failure 2/2 afib RVR -we discussed this at length today. My suspicion would be that given his persistently elevated heart rate in the hospital, the afib clinic, and today, that his bradycardic readings at home are likely incorrect. Suspect that the pulse ox and BP cuff cannot sense his afib RVR accurately. -given this, will add back the diltiazem for rate control as he is in RVR today -I agree with the prior comments in afib clinic that he is unlikely to maintain sinus rhythm without an antiarrhythmia. He was not in sinus long post cardioversion. He has started amiodarone load -he is hemodynamically stable today, saturating well on O2, though still mildly volume overloaded. Question scale today, does not endorse this at home -counseled on diuresis, weight monitoring, fluid restriction, and salt avoidance -at this time, he does not meet criteria for admission, as  management would not be different than home. However, he lives alone, and his daughter will be returning to her home soon. We will set up home health nursing to check on him, do vitals, etc to make sure he remains stable for home management -will allow amiodarone to load, and then he will likely need repeat cardioversion -has follow up with afib clinic in two weeks -instructed on red flag warning signs that need immediate medical attention  Will need sleep study once his breathing/clinical status is at baseline.  Plan for follow up: 2 weeks with  afib clinic, 1 month with me, will arrange for home health as well  Total time of encounter: 66 minutes total time of encounter, including 56 minutes spent in face-to-face patient care. This time includes coordination of care and counseling regarding atrial fibrillation management, symptoms, home care. Remainder of non-face-to-face time involved reviewing chart documents/testing relevant to the patient encounter and documentation in the medical record.  In room at 9:45 AM, out 10:41 AM.  Jodelle Red, MD, PhD [place]  [facility]    Medication Adjustments/Labs and Tests Ordered: Current medicines are reviewed at length with the patient today.  Concerns regarding medicines are outlined above.  Orders Placed This Encounter  Procedures  . EKG 12-Lead   No orders of the defined types were placed in this encounter.   Patient Instructions  Medication Instructions:  Restart Diltiazem 120 mg daily  *If you need a refill on your cardiac medications before your next appointment, please call your pharmacy*  Lab Work: None  Testing/Procedures: None  Follow-Up: At [facility], you and your health needs are our priority.  As part of our continuing mission to provide you with exceptional heart care, we have created designated Provider Care Teams.  These Care Teams include your primary Cardiologist (physician) and Advanced Practice  Providers (APPs -  Physician Assistants and Nurse Practitioners) who all work together to provide you with the care you need, when you need it.  Your next appointment:   1 month(s)  The format for your next appointment:   In Person  Provider:   Jodelle Red, MD     Signed, Jodelle Red, MD PhD 10/01/2019   [facility]

## 2019-10-04 ENCOUNTER — Telehealth: Payer: Self-pay | Admitting: Cardiology

## 2019-10-04 NOTE — Telephone Encounter (Signed)
New message    Pt c/o Shortness Of Breath: STAT if SOB developed within the last 24 hours or pt is noticeably SOB on the phone  1. Are you currently SOB (can you hear that pt is SOB on the phone)?no   2. How long have you been experiencing SOB? For about a week per daughter   3. Are you SOB when sitting or when up moving around? Moving around   4. Are you currently experiencing any other symptoms? No     Patient's daughter wants to know what [facility] was called for someone to call in and check on the patient. Please advise

## 2019-10-04 NOTE — Telephone Encounter (Signed)
Spoke with patient's daughter. Daughter reports patient has shortness of breath with exertion and when going up the stairs. She reports it takes 30 minutes for him to recover. She reports they informed Dr. Cristal Deer of this at the last visit. She just wanted [place] to be aware.   Patient does have some shortness of breath when laying flat. Educated daughter to have patient use extra pillows or to sleep in an inclined position. He may sleep in his recliner if that is more comfortable.   Patient's oxygen saturation has been 95%.   Patient has not been taking daily weights, takes his lasix BID. Educated daughter to check weights daily and notify us if patient gains 3lbs overnight or 5lbs in a week. Daughter not with patient to assess edema.   Daughter educated to call back or to call 911 if symptoms worsen.  [facility] contacted, they state first visit should occur tomorrow.

## 2019-10-05 ENCOUNTER — Encounter: Payer: Self-pay | Admitting: Cardiology

## 2019-10-08 ENCOUNTER — Telehealth: Payer: Self-pay | Admitting: Cardiology

## 2019-10-08 DIAGNOSIS — E669 Obesity, unspecified: Secondary | ICD-10-CM | POA: Diagnosis not present

## 2019-10-08 DIAGNOSIS — Z9181 History of falling: Secondary | ICD-10-CM | POA: Diagnosis not present

## 2019-10-08 DIAGNOSIS — E119 Type 2 diabetes mellitus without complications: Secondary | ICD-10-CM | POA: Diagnosis not present

## 2019-10-08 DIAGNOSIS — Z7901 Long term (current) use of anticoagulants: Secondary | ICD-10-CM | POA: Diagnosis not present

## 2019-10-08 DIAGNOSIS — I083 Combined rheumatic disorders of mitral, aortic and tricuspid valves: Secondary | ICD-10-CM | POA: Diagnosis not present

## 2019-10-08 DIAGNOSIS — Z7951 Long term (current) use of inhaled steroids: Secondary | ICD-10-CM | POA: Diagnosis not present

## 2019-10-08 DIAGNOSIS — E7849 Other hyperlipidemia: Secondary | ICD-10-CM | POA: Diagnosis not present

## 2019-10-08 DIAGNOSIS — I4819 Other persistent atrial fibrillation: Secondary | ICD-10-CM | POA: Diagnosis not present

## 2019-10-08 DIAGNOSIS — D6869 Other thrombophilia: Secondary | ICD-10-CM | POA: Diagnosis not present

## 2019-10-08 DIAGNOSIS — J45909 Unspecified asthma, uncomplicated: Secondary | ICD-10-CM | POA: Diagnosis not present

## 2019-10-08 DIAGNOSIS — F329 Major depressive disorder, single episode, unspecified: Secondary | ICD-10-CM | POA: Diagnosis not present

## 2019-10-08 DIAGNOSIS — Z794 Long term (current) use of insulin: Secondary | ICD-10-CM | POA: Diagnosis not present

## 2019-10-08 DIAGNOSIS — I10 Essential (primary) hypertension: Secondary | ICD-10-CM | POA: Diagnosis not present

## 2019-10-08 DIAGNOSIS — Z6837 Body mass index (BMI) 37.0-37.9, adult: Secondary | ICD-10-CM | POA: Diagnosis not present

## 2019-10-08 DIAGNOSIS — Z8701 Personal history of pneumonia (recurrent): Secondary | ICD-10-CM | POA: Diagnosis not present

## 2019-10-08 NOTE — Telephone Encounter (Signed)
Brandon Sanchez is calling for nursing orders for A-fib management. She needs orders for visits, once a weeks for one week, 2x's a week 2 weeks, and then once weekly for 5 weeks.

## 2019-10-08 NOTE — Telephone Encounter (Signed)
Routed to MD for review.

## 2019-10-11 ENCOUNTER — Ambulatory Visit: Payer: Medicare Other

## 2019-10-11 ENCOUNTER — Other Ambulatory Visit: Payer: Self-pay | Admitting: *Deleted

## 2019-10-11 ENCOUNTER — Other Ambulatory Visit (HOSPITAL_COMMUNITY)
Admission: RE | Admit: 2019-10-11 | Discharge: 2019-10-11 | Disposition: A | Discharge status: Home / Self Care | Payer: Medicare Other | Source: Ambulatory Visit | Attending: Cardiovascular Disease | Admitting: Cardiovascular Disease

## 2019-10-11 DIAGNOSIS — Z20822 Contact with and (suspected) exposure to covid-19: Secondary | ICD-10-CM | POA: Diagnosis not present

## 2019-10-11 DIAGNOSIS — Z01812 Encounter for preprocedural laboratory examination: Secondary | ICD-10-CM | POA: Diagnosis not present

## 2019-10-11 LAB — SARS CORONAVIRUS 2 (TAT 6-24 HRS): SARS Coronavirus 2: NEGATIVE

## 2019-10-11 NOTE — Patient Outreach (Signed)
[facility]) Care Management  10/11/2019  Brandon Sanchez 1945/11/09 [id number]   Call placed to member to follow up on management of A-fib and to complete initial assessment.  He report he has been doing well, having home health [facility]) visit twice a week, last visit today.  He has not been independently monitoring his blood pressure/heart rate, stating the staff from [facility] check it when they come.  He does not feel his machine is reliable, need a new one.  Report following a low sodium diet, last week by cardiology on 2/12, next appointment tomorrow.  Office visit AVS reviewed, he verbalizes understanding of medication changes.  He has been checking his weights, denies gain, report he has lost 2-3 pounds over the last week.  He has not made appointment with PCP, feel he does not need to at this time as he has been following up frequently with cardiology.  He is aware of the need to manage other chronic medical conditions, state he will follow up soon.  Denies any urgent concerns at this time, will follow up within the next month.  [facility] CM Care Plan Problem One     Most Recent Value  Care Plan Problem One  Risk for hospitalization related to A-fib as evidenced by recent admission  Role Documenting the Problem One  Care Management [place] for Problem One  Active  [facility] Long Term Goal   Member will not be readmitted to hospital within the next 31 days  [facility] Long Term Goal Start Date  09/27/19  Interventions for Problem One Long Term Goal  Management of A-fib reviewd with member, including daily monitoring of heart rate and blood pressure.    [facility] CM Short Term Goal #1   Member will report checking blood pressure and heart rate daily over the next 4 weeks  [facility] CM Short Term Goal #1 Start Date  09/27/19  Interventions for Short Term Goal #1  Will have [facility] send member new BP monitor  [facility] CM Short Term Goal #2   Member will keep and attend follow up appointmet with cardiology  within the next week  [facility] CM Short Term Goal #2 Start Date  09/27/19  [facility] CM Short Term Goal #2 Met Date  10/11/19  [facility] CM Short Term Goal #3  Member will report taking medications as instructed over the next 4 weeks  [facility] CM Short Term Goal #3 Start Date  10/11/19  Interventions for Short Tern Goal #3  Reviewed medication changes with member,assessed need for pharmacy assistance     Valente David, [place], MSN [place] Manager [phone]

## 2019-10-11 NOTE — Telephone Encounter (Signed)
Left message to call back  

## 2019-10-11 NOTE — Telephone Encounter (Signed)
Just to confirm: For the first week, is the order for once a week? I want to make sure I have this correctly. I agree with twice a week for two weeks, then once a week for 5 weeks for the following weeks. Thanks.

## 2019-10-11 NOTE — Telephone Encounter (Signed)
Spoke with Marchelle Folks who confirmed orders.  1 week x 1 ( pt has multiple appointments this week) 2 weeks x 2 5 weeks x 1

## 2019-10-12 ENCOUNTER — Other Ambulatory Visit: Payer: Self-pay

## 2019-10-12 ENCOUNTER — Other Ambulatory Visit (HOSPITAL_COMMUNITY): Payer: Self-pay | Admitting: Physician Assistant

## 2019-10-12 ENCOUNTER — Ambulatory Visit (HOSPITAL_COMMUNITY)
Admission: RE | Admit: 2019-10-12 | Discharge: 2019-10-12 | Disposition: A | Discharge status: Home / Self Care | Payer: Medicare Other | Source: Ambulatory Visit | Attending: Physician Assistant | Admitting: Physician Assistant

## 2019-10-12 ENCOUNTER — Encounter (HOSPITAL_COMMUNITY): Payer: Self-pay | Admitting: Physician Assistant

## 2019-10-12 ENCOUNTER — Other Ambulatory Visit (HOSPITAL_COMMUNITY): Payer: Self-pay | Admitting: *Deleted

## 2019-10-12 VITALS — BP 90/60 | HR 139 | Ht 75.0 in | Wt 297.0 lb

## 2019-10-12 DIAGNOSIS — E785 Hyperlipidemia, unspecified: Secondary | ICD-10-CM | POA: Diagnosis not present

## 2019-10-12 DIAGNOSIS — I4892 Unspecified atrial flutter: Secondary | ICD-10-CM | POA: Insufficient documentation

## 2019-10-12 DIAGNOSIS — I4819 Other persistent atrial fibrillation: Secondary | ICD-10-CM | POA: Diagnosis not present

## 2019-10-12 DIAGNOSIS — E119 Type 2 diabetes mellitus without complications: Secondary | ICD-10-CM | POA: Diagnosis not present

## 2019-10-12 DIAGNOSIS — I10 Essential (primary) hypertension: Secondary | ICD-10-CM | POA: Insufficient documentation

## 2019-10-12 DIAGNOSIS — D6869 Other thrombophilia: Secondary | ICD-10-CM

## 2019-10-12 DIAGNOSIS — Z79899 Other long term (current) drug therapy: Secondary | ICD-10-CM | POA: Insufficient documentation

## 2019-10-12 DIAGNOSIS — Z7901 Long term (current) use of anticoagulants: Secondary | ICD-10-CM | POA: Insufficient documentation

## 2019-10-12 DIAGNOSIS — R0602 Shortness of breath: Secondary | ICD-10-CM | POA: Insufficient documentation

## 2019-10-12 DIAGNOSIS — Z794 Long term (current) use of insulin: Secondary | ICD-10-CM | POA: Insufficient documentation

## 2019-10-12 LAB — CBC
HCT: 43.6 % (ref 39.0–52.0)
Hemoglobin: 13.6 g/dL (ref 13.0–17.0)
MCH: 29.4 pg (ref 26.0–34.0)
MCHC: 31.2 g/dL (ref 30.0–36.0)
MCV: 94.4 fL (ref 80.0–100.0)
Platelets: 311 10*3/uL (ref 150–400)
RBC: 4.62 MIL/uL (ref 4.22–5.81)
RDW: 13.2 % (ref 11.5–15.5)
WBC: 20.1 10*3/uL — ABNORMAL HIGH (ref 4.0–10.5)
nRBC: 0 % (ref 0.0–0.2)

## 2019-10-12 LAB — BASIC METABOLIC PANEL
Anion gap: 17 — ABNORMAL HIGH (ref 5–15)
BUN: 17 mg/dL (ref 8–23)
CO2: 30 mmol/L (ref 22–32)
Calcium: 8.7 mg/dL — ABNORMAL LOW (ref 8.9–10.3)
Chloride: 93 mmol/L — ABNORMAL LOW (ref 98–111)
Creatinine, Ser: 1.7 mg/dL — ABNORMAL HIGH (ref 0.61–1.24)
GFR calc Af Amer: 46 mL/min — ABNORMAL LOW (ref >60)
GFR calc non Af Amer: 39 mL/min — ABNORMAL LOW (ref >60)
Glucose, Bld: 271 mg/dL — ABNORMAL HIGH (ref 70–99)
Potassium: 3.3 mmol/L — ABNORMAL LOW (ref 3.5–5.1)
Sodium: 140 mmol/L (ref 135–145)

## 2019-10-12 MED ORDER — POTASSIUM CHLORIDE ER 10 MEQ PO TBCR: EXTENDED_RELEASE_TABLET | ORAL | 0 refills | Status: DC

## 2019-10-12 NOTE — Progress Notes (Signed)
  Primary Care Physician: Pharr, Walter, MD Primary Cardiologist: Dr Christopher Primary Electrophysiologist: none Referring Physician: Dr Christopher   Brandon Sanchez is a 73 y.o. male with a history of asthma, type II diabetes, dyslipidemia, hypertension, and persistent atrial fibrillation who presents for follow up in the [facility].  The patient was initially diagnosed with atrial fibrillation on 09/20/19 after presenting with symptoms of worsening fatigue and SOB. He was found to be in afib with RVR in the setting of sepsis from CAP. Patient was started on Eliquis for a CHADS2VASC score of 3. He underwent TEE/DCCV on 09/22/19. Echo showed preserved EF 50-55% with mildly dilated LA. Since his admission, patient reports that he has had elevated heart rates at times associated with SOB. He is in afib today with RVR. He does admit to drinking 2-3 glasses of wine daily prior to his hospitalization. He also states that he snores and frequently naps during the day.  On follow up today, patient and his daughter report that his SOB has improved although still persistent. He is not sleeping well at night. He is back on the diltiazem with minimally improved pulse rates. He denies increased lower extremity edema or dizziness.   Today, he denies symptoms of chest pain, orthopnea, PND, dizziness, presyncope, syncope, bleeding, or neurologic sequela. The patient is tolerating medications without difficulties and is otherwise without complaint today.    Atrial Fibrillation Risk Factors:  he does have symptoms of sleep apnea. he does not have a history of rheumatic fever. he does have a history of alcohol use. The patient does not have a history of early familial atrial fibrillation or other arrhythmias.  he has a BMI of Body mass index is 37.12 kg/m.. Filed Weights   10/12/19 1334  Weight: 134.7 kg    Family History  Problem Relation Age of Onset  . Colon cancer Father    . Heart disease Paternal Grandfather      Atrial Fibrillation Management history:  Previous antiarrhythmic drugs: amiodarone Previous cardioversions: 09/22/19 Previous ablations: none CHADS2VASC score: 3 Anticoagulation history: Eliquis   Past Medical History:  Diagnosis Date  . Allergic rhinitis, cause unspecified   . Asthma   . Other and unspecified hyperlipidemia   . Type II or unspecified type diabetes mellitus without mention of complication, not stated as uncontrolled   . Unspecified essential hypertension    Past Surgical History:  Procedure Laterality Date  . CARDIOVERSION N/A 09/22/2019   Procedure: CARDIOVERSION;  Surgeon: Schumann, Christopher L, MD;  Location: [facility];  Service: Endoscopy;  Laterality: N/A;  . TEE WITHOUT CARDIOVERSION N/A 09/22/2019   Procedure: TRANSESOPHAGEAL ECHOCARDIOGRAM (TEE);  Surgeon: Schumann, Christopher L, MD;  Location: [facility]COPY;  Service: Endoscopy;  Laterality: N/A;    Current Outpatient Medications  Medication Sig Dispense Refill  . acetaminophen (TYLENOL) 500 MG tablet Take by mouth as needed.     . albuterol (PROAIR HFA) 108 (90 BASE) MCG/ACT inhaler 2 puffs every 4 hours as needed only  if your can't catch your breath 1 Inhaler 1  . amiodarone (PACERONE) 200 MG tablet TAKE 1 TABLET BY MOUTH TWICE DAILY FOR 1 MONTH, THEN REDUCE TO 1 TABLET BY MOUTH EVERY DAY 60 tablet 0  . apixaban (ELIQUIS) 5 MG TABS tablet Take 1 tablet (5 mg total) by mouth 2 (two) times daily. 60 tablet 0  . atorvastatin (LIPITOR) 40 MG tablet Take 40 mg by mouth daily.      . BREO ELLIPTA   200-25 MCG/INH AEPB Inhale 1 puff into the lungs daily.    Marland Kitchen diltiazem (CARDIZEM CD) 120 MG 24 hr capsule Take 1 capsule (120 mg total) by mouth daily. 30 capsule 3  . furosemide (LASIX) 40 MG tablet Take 1 tablet (40 mg total) by mouth 2 (two) times daily. 2 tablets in AM and 1 tablet midday. (Patient taking differently: Take 40 mg by mouth daily. Taking 2 tablets in  the am and 1 tablet in the pm) 30 tablet 0  . Glucosamine-Chondroit-Vit C-Mn (GLUCOSAMINE CHONDR 1500 COMPLX) CAPS Take 1 capsule by mouth 2 (two) times daily.    . insulin aspart protamine- aspart (NOVOLOG MIX 70/30) (70-30) 100 UNIT/ML injection Inject 40 Units into the skin 2 (two) times daily at 8 am and 10 pm.    . metoprolol succinate (TOPROL-XL) 200 MG 24 hr tablet Take 1 tablet (200 mg total) by mouth daily. Take with or immediately following a meal. 30 tablet 0  . Multiple Vitamin (MULTIVITAMIN WITH MINERALS) TABS tablet Take 1 tablet by mouth daily.    Marland Kitchen omeprazole (PRILOSEC) 40 MG capsule Take 40 mg by mouth daily as needed (heart burn).     Letta Pate VERIO test strip USE AS DIRECTED TO TEST BLOOD SUGAR THREE TIMES DAILY FOR 30 DAYS    . PREVIDENT 5000 BOOSTER PLUS 1.1 % PSTE USE AT NIGHT IN PLACE OF REGULAR TOOTHPASTE AND DO NOT EAT OR DRINK ANYTHING AFTERWARDS    . sertraline (ZOLOFT) 100 MG tablet Take 100 mg by mouth daily.     No current facility-administered medications for this encounter.    No Known Allergies  Social History   Socioeconomic History  . Marital status: Divorced    Spouse name: Not on file  . Number of children: Not on file  . Years of education: Not on file  . Highest education level: Not on file  Occupational History  . Occupation: Armed forces logistics/support/administrative officer  Tobacco Use  . Smoking status: Never Smoker  . Smokeless tobacco: Never Used  Substance and Sexual Activity  . Alcohol use: Not Currently    Alcohol/week: 3.0 standard drinks    Types: 3 Glasses of wine per week  . Drug use: Never  . Sexual activity: Not on file  Other Topics Concern  . Not on file  Social History Narrative  . Not on file   Social Determinants of Health   Financial Resource Strain:   . Difficulty of Paying Living Expenses: Not on file  Food Insecurity: No Food Insecurity  . Worried About Programme researcher, broadcasting/film/video in the Last Year: Never true  . Ran Out of Food in the Last Year:  Never true  Transportation Needs: No Transportation Needs  . Lack of Transportation (Medical): No  . Lack of Transportation (Non-Medical): No  Physical Activity:   . Days of Exercise per Week: Not on file  . Minutes of Exercise per Session: Not on file  Stress:   . Feeling of Stress : Not on file  Social Connections:   . Frequency of Communication with Friends and Family: Not on file  . Frequency of Social Gatherings with Friends and Family: Not on file  . Attends Religious Services: Not on file  . Active Member of Clubs or Organizations: Not on file  . Attends Banker Meetings: Not on file  . Marital Status: Not on file  Intimate Partner Violence:   . Fear of Current or Ex-Partner: Not on file  . Emotionally  Abused: Not on file  . Physically Abused: Not on file  . Sexually Abused: Not on file     ROS- All systems are reviewed and negative except as per the HPI above.  Physical Exam: Vitals:   10/12/19 1334  BP: 90/60  Pulse: (!) 139  SpO2: 93%  Weight: 134.7 kg  Height: 6\' 3"  (1.905 m)    GEN- The patient is well appearing obese male, alert and oriented x 3 today.   HEENT-head normocephalic, atraumatic, sclera clear, conjunctiva pink, hearing intact, trachea midline. Lungs- Clear to ausculation bilaterally, normal work of breathing Heart- irregular rate and rhythm, tachycardia, no murmurs, rubs or gallops  GI- soft, NT, ND, + BS Extremities- no clubbing, cyanosis. 1+ bilateral edema MS- no significant deformity or atrophy Skin- no rash or lesion Psych- euthymic mood, full affect Neuro- strength and sensation are intact   Wt Readings from Last 3 Encounters:  10/12/19 134.7 kg  09/28/19 (!) 148.1 kg  09/22/19 136.1 kg    EKG today demonstrates afib HR 139, PVCs, LAFB, QRS 102, QTc 401  Echo 09/20/19 demonstrated  1. Left ventricular ejection fraction, by visual estimation, is 50 to  55%. The left ventricle has normal function. There is mildly  increased  left ventricular hypertrophy.  2. Definity contrast agent was given IV to delineate the left ventricular  endocardial borders.  3. Left ventricular diastolic function could not be evaluated.  4. Mildly dilated left ventricular internal cavity size.  5. The left ventricle has no regional wall motion abnormalities.  6. Global right ventricle has normal systolic function.The right  ventricular size is mildly enlarged.  7. Left atrial size was mildly dilated.  8. Right atrial size was mildly dilated.  9. Mild mitral annular calcification.  10. Trivial mitral valve regurgitation. No evidence of mitral stenosis.  11. The tricuspid valve is normal in structure. Tricuspid valve  regurgitation is trivial.  12. The aortic valve was not well visualized. Aortic valve regurgitation  is not visualized. Mild to moderate aortic valve stenosis.  13. The pulmonic valve was not well visualized. Pulmonic valve  regurgitation is not visualized.  14. The inferior vena cava is dilated in size with <50% respiratory  variability, suggesting right atrial pressure of 15 mmHg.  15. Technically difficult; definity used; low normal LV systolic function;  mild LVH; mild LVE; calcified aortic valve with mild to moderate AS (mean  gradient 19 mmHg); mild LAE/RAE/RVE.   Epic records are reviewed at length today  CHA2DS2-VASc Score = 3 The patient's score is based upon: CHF History: No HTN History: Yes Age : 42-74 Diabetes History: Yes Stroke History: No Vascular Disease History: No Gender: Male   ASSESSMENT AND PLAN: 1. Persistent Atrial Fibrillation (ICD10:  I48.19) The patient's CHA2DS2-VASc score is 3, indicating a 3.2% annual risk of stroke.   S/p TEE/DCCV on 09/22/19. Unfortunately he has had ERAF. Patient remains in rapid afib. On pulse oximeter during office visit his pulse rate ranged from 90s-130. No room in BP to increase rate control.  Continue amiodarone 200 mg BID for 2 weeks  then decrease to once daily.  Given his rapid rates, will plan for DCCV after 3 weeks of amiodarone loading. Will schedule today. Check bmet/CBC Continue diltiazem 120 mg daily for rate control.  Continue Eliquis 5 mg BID Continue Toprol 200 mg daily ER precautions given.   2. Secondary Hypercoagulable State (ICD10:  D68.69) The patient is at significant risk for stroke/thromboembolism based upon his CHA2DS2-VASc Score  of 3.  Continue Apixaban (Eliquis).   3. Obesity Body mass index is 37.12 kg/m. Lifestyle modification was discussed and encouraged including regular physical activity and weight reduction.  4. Suspected Obstructive sleep apnea The importance of adequate treatment of sleep apnea was discussed today in order to improve our ability to maintain sinus rhythm long term. Sleep study pending.  5. HTN Low today, no room to titrate rate control.    Follow up on week post DCCV.   [facility] [address] [place], [place] [address] [phone] 10/12/2019 2:28 PM

## 2019-10-12 NOTE — Patient Instructions (Signed)
Cardioversion scheduled for Monday, March 1st  - Arrive at the [facility] and go to admitting at 9:30AM  -Do not eat or drink anything after midnight the night prior to your procedure.  - Take all your morning medication with a sip of water prior to arrival.  - You will not be able to drive home after your procedure.

## 2019-10-12 NOTE — H&P (View-Only) (Signed)
Primary Care Physician: Merri Brunette, MD Primary Cardiologist: Dr Cristal Deer Primary Electrophysiologist: none Referring Physician: Dr Fletcher Anon is a 74 y.o. male with a history of asthma, type II diabetes, dyslipidemia, hypertension, and persistent atrial fibrillation who presents for follow up in the [facility].  The patient was initially diagnosed with atrial fibrillation on 09/20/19 after presenting with symptoms of worsening fatigue and SOB. He was found to be in afib with RVR in the setting of sepsis from CAP. Patient was started on Eliquis for a CHADS2VASC score of 3. He underwent TEE/DCCV on 09/22/19. Echo showed preserved EF 50-55% with mildly dilated LA. Since his admission, patient reports that he has had elevated heart rates at times associated with SOB. He is in afib today with RVR. He does admit to drinking 2-3 glasses of wine daily prior to his hospitalization. He also states that he snores and frequently naps during the day.  On follow up today, patient and his daughter report that his SOB has improved although still persistent. He is not sleeping well at night. He is back on the diltiazem with minimally improved pulse rates. He denies increased lower extremity edema or dizziness.   Today, he denies symptoms of chest pain, orthopnea, PND, dizziness, presyncope, syncope, bleeding, or neurologic sequela. The patient is tolerating medications without difficulties and is otherwise without complaint today.    Atrial Fibrillation Risk Factors:  he does have symptoms of sleep apnea. he does not have a history of rheumatic fever. he does have a history of alcohol use. The patient does not have a history of early familial atrial fibrillation or other arrhythmias.  he has a BMI of Body mass index is 37.12 kg/m.Marland Kitchen Filed Weights   10/12/19 1334  Weight: 134.7 kg    Family History  Problem Relation Age of Onset  . Colon cancer Father    . Heart disease Paternal Grandfather      Atrial Fibrillation Management history:  Previous antiarrhythmic drugs: amiodarone Previous cardioversions: 09/22/19 Previous ablations: none CHADS2VASC score: 3 Anticoagulation history: Eliquis   Past Medical History:  Diagnosis Date  . Allergic rhinitis, cause unspecified   . Asthma   . Other and unspecified hyperlipidemia   . Type II or unspecified type diabetes mellitus without mention of complication, not stated as uncontrolled   . Unspecified essential hypertension    Past Surgical History:  Procedure Laterality Date  . CARDIOVERSION N/A 09/22/2019   Procedure: CARDIOVERSION;  Surgeon: [name], MD;  Location: [facility] ENDOSCOPY;  Service: Endoscopy;  Laterality: N/A;  . TEE WITHOUT CARDIOVERSION N/A 09/22/2019   Procedure: TRANSESOPHAGEAL ECHOCARDIOGRAM (TEE);  Surgeon: [name], MD;  Location: [facility] ENDOSCOPY;  Service: Endoscopy;  Laterality: N/A;    Current Outpatient Medications  Medication Sig Dispense Refill  . acetaminophen (TYLENOL) 500 MG tablet Take by mouth as needed.     Marland Kitchen albuterol (PROAIR HFA) 108 (90 BASE) MCG/ACT inhaler 2 puffs every 4 hours as needed only  if your can't catch your breath 1 Inhaler 1  . amiodarone (PACERONE) 200 MG tablet TAKE 1 TABLET BY MOUTH TWICE DAILY FOR 1 MONTH, THEN REDUCE TO 1 TABLET BY MOUTH EVERY DAY 60 tablet 0  . apixaban (ELIQUIS) 5 MG TABS tablet Take 1 tablet (5 mg total) by mouth 2 (two) times daily. 60 tablet 0  . atorvastatin (LIPITOR) 40 MG tablet Take 40 mg by mouth daily.      Marland Kitchen BREO ELLIPTA  200-25 MCG/INH AEPB Inhale 1 puff into the lungs daily.    Marland Kitchen diltiazem (CARDIZEM CD) 120 MG 24 hr capsule Take 1 capsule (120 mg total) by mouth daily. 30 capsule 3  . furosemide (LASIX) 40 MG tablet Take 1 tablet (40 mg total) by mouth 2 (two) times daily. 2 tablets in AM and 1 tablet midday. (Patient taking differently: Take 40 mg by mouth daily. Taking 2 tablets in  the am and 1 tablet in the pm) 30 tablet 0  . Glucosamine-Chondroit-Vit C-Mn (GLUCOSAMINE CHONDR 1500 COMPLX) CAPS Take 1 capsule by mouth 2 (two) times daily.    . insulin aspart protamine- aspart (NOVOLOG MIX 70/30) (70-30) 100 UNIT/ML injection Inject 40 Units into the skin 2 (two) times daily at 8 am and 10 pm.    . metoprolol succinate (TOPROL-XL) 200 MG 24 hr tablet Take 1 tablet (200 mg total) by mouth daily. Take with or immediately following a meal. 30 tablet 0  . Multiple Vitamin (MULTIVITAMIN WITH MINERALS) TABS tablet Take 1 tablet by mouth daily.    Marland Kitchen omeprazole (PRILOSEC) 40 MG capsule Take 40 mg by mouth daily as needed (heart burn).     Letta Pate VERIO test strip USE AS DIRECTED TO TEST BLOOD SUGAR THREE TIMES DAILY FOR 30 DAYS    . PREVIDENT 5000 BOOSTER PLUS 1.1 % PSTE USE AT NIGHT IN PLACE OF REGULAR TOOTHPASTE AND DO NOT EAT OR DRINK ANYTHING AFTERWARDS    . sertraline (ZOLOFT) 100 MG tablet Take 100 mg by mouth daily.     No current facility-administered medications for this encounter.    No Known Allergies  Social History   Socioeconomic History  . Marital status: Divorced    Spouse name: Not on file  . Number of children: Not on file  . Years of education: Not on file  . Highest education level: Not on file  Occupational History  . Occupation: Armed forces logistics/support/administrative officer  Tobacco Use  . Smoking status: Never Smoker  . Smokeless tobacco: Never Used  Substance and Sexual Activity  . Alcohol use: Not Currently    Alcohol/week: 3.0 standard drinks    Types: 3 Glasses of wine per week  . Drug use: Never  . Sexual activity: Not on file  Other Topics Concern  . Not on file  Social History Narrative  . Not on file   Social Determinants of Health   Financial Resource Strain:   . Difficulty of Paying Living Expenses: Not on file  Food Insecurity: No Food Insecurity  . Worried About Programme researcher, broadcasting/film/video in the Last Year: Never true  . Ran Out of Food in the Last Year:  Never true  Transportation Needs: No Transportation Needs  . Lack of Transportation (Medical): No  . Lack of Transportation (Non-Medical): No  Physical Activity:   . Days of Exercise per Week: Not on file  . Minutes of Exercise per Session: Not on file  Stress:   . Feeling of Stress : Not on file  Social Connections:   . Frequency of Communication with Friends and Family: Not on file  . Frequency of Social Gatherings with Friends and Family: Not on file  . Attends Religious Services: Not on file  . Active Member of Clubs or Organizations: Not on file  . Attends Banker Meetings: Not on file  . Marital Status: Not on file  Intimate Partner Violence:   . Fear of Current or Ex-Partner: Not on file  . Emotionally  Abused: Not on file  . Physically Abused: Not on file  . Sexually Abused: Not on file     ROS- All systems are reviewed and negative except as per the HPI above.  Physical Exam: Vitals:   10/12/19 1334  BP: 90/60  Pulse: (!) 139  SpO2: 93%  Weight: 134.7 kg  Height: 6\' 3"  (1.905 m)    GEN- The patient is well appearing obese male, alert and oriented x 3 today.   HEENT-head normocephalic, atraumatic, sclera clear, conjunctiva pink, hearing intact, trachea midline. Lungs- Clear to ausculation bilaterally, normal work of breathing Heart- irregular rate and rhythm, tachycardia, no murmurs, rubs or gallops  GI- soft, NT, ND, + BS Extremities- no clubbing, cyanosis. 1+ bilateral edema MS- no significant deformity or atrophy Skin- no rash or lesion Psych- euthymic mood, full affect Neuro- strength and sensation are intact   Wt Readings from Last 3 Encounters:  10/12/19 134.7 kg  09/28/19 (!) 148.1 kg  09/22/19 136.1 kg    EKG today demonstrates afib HR 139, PVCs, LAFB, QRS 102, QTc 401  Echo 09/20/19 demonstrated  1. Left ventricular ejection fraction, by visual estimation, is 50 to  55%. The left ventricle has normal function. There is mildly  increased  left ventricular hypertrophy.  2. Definity contrast agent was given IV to delineate the left ventricular  endocardial borders.  3. Left ventricular diastolic function could not be evaluated.  4. Mildly dilated left ventricular internal cavity size.  5. The left ventricle has no regional wall motion abnormalities.  6. Global right ventricle has normal systolic function.The right  ventricular size is mildly enlarged.  7. Left atrial size was mildly dilated.  8. Right atrial size was mildly dilated.  9. Mild mitral annular calcification.  10. Trivial mitral valve regurgitation. No evidence of mitral stenosis.  11. The tricuspid valve is normal in structure. Tricuspid valve  regurgitation is trivial.  12. The aortic valve was not well visualized. Aortic valve regurgitation  is not visualized. Mild to moderate aortic valve stenosis.  13. The pulmonic valve was not well visualized. Pulmonic valve  regurgitation is not visualized.  14. The inferior vena cava is dilated in size with <50% respiratory  variability, suggesting right atrial pressure of 15 mmHg.  15. Technically difficult; definity used; low normal LV systolic function;  mild LVH; mild LVE; calcified aortic valve with mild to moderate AS (mean  gradient 19 mmHg); mild LAE/RAE/RVE.   Epic records are reviewed at length today  CHA2DS2-VASc Score = 3 The patient's score is based upon: CHF History: No HTN History: Yes Age : 42-74 Diabetes History: Yes Stroke History: No Vascular Disease History: No Gender: Male   ASSESSMENT AND PLAN: 1. Persistent Atrial Fibrillation (ICD10:  I48.19) The patient's CHA2DS2-VASc score is 3, indicating a 3.2% annual risk of stroke.   S/p TEE/DCCV on 09/22/19. Unfortunately he has had ERAF. Patient remains in rapid afib. On pulse oximeter during office visit his pulse rate ranged from 90s-130. No room in BP to increase rate control.  Continue amiodarone 200 mg BID for 2 weeks  then decrease to once daily.  Given his rapid rates, will plan for DCCV after 3 weeks of amiodarone loading. Will schedule today. Check bmet/CBC Continue diltiazem 120 mg daily for rate control.  Continue Eliquis 5 mg BID Continue Toprol 200 mg daily ER precautions given.   2. Secondary Hypercoagulable State (ICD10:  D68.69) The patient is at significant risk for stroke/thromboembolism based upon his CHA2DS2-VASc Score  of 3.  Continue Apixaban (Eliquis).   3. Obesity Body mass index is 37.12 kg/m. Lifestyle modification was discussed and encouraged including regular physical activity and weight reduction.  4. Suspected Obstructive sleep apnea The importance of adequate treatment of sleep apnea was discussed today in order to improve our ability to maintain sinus rhythm long term. Sleep study pending.  5. HTN Low today, no room to titrate rate control.    Follow up on week post DCCV.   [facility] [address] [place], [place] [address] [phone] 10/12/2019 2:28 PM

## 2019-10-13 ENCOUNTER — Ambulatory Visit (HOSPITAL_BASED_OUTPATIENT_CLINIC_OR_DEPARTMENT_OTHER): Payer: Federal, State, Local not specified - PPO | Attending: Physician Assistant | Admitting: Cardiovascular Disease

## 2019-10-14 ENCOUNTER — Other Ambulatory Visit (HOSPITAL_COMMUNITY)
Admission: RE | Admit: 2019-10-14 | Discharge: 2019-10-14 | Disposition: A | Discharge status: Home / Self Care | Payer: Medicare Other | Source: Ambulatory Visit | Attending: Cardiology | Admitting: Cardiology

## 2019-10-14 DIAGNOSIS — Z01812 Encounter for preprocedural laboratory examination: Secondary | ICD-10-CM | POA: Insufficient documentation

## 2019-10-14 DIAGNOSIS — R300 Dysuria: Secondary | ICD-10-CM | POA: Diagnosis not present

## 2019-10-14 DIAGNOSIS — Z20822 Contact with and (suspected) exposure to covid-19: Secondary | ICD-10-CM | POA: Insufficient documentation

## 2019-10-14 DIAGNOSIS — R358 Other polyuria: Secondary | ICD-10-CM | POA: Diagnosis not present

## 2019-10-14 LAB — SARS CORONAVIRUS 2 (TAT 6-24 HRS): SARS Coronavirus 2: NEGATIVE

## 2019-10-15 DIAGNOSIS — I10 Essential (primary) hypertension: Secondary | ICD-10-CM | POA: Diagnosis not present

## 2019-10-15 DIAGNOSIS — D6869 Other thrombophilia: Secondary | ICD-10-CM | POA: Diagnosis not present

## 2019-10-15 DIAGNOSIS — E7849 Other hyperlipidemia: Secondary | ICD-10-CM | POA: Diagnosis not present

## 2019-10-15 DIAGNOSIS — E119 Type 2 diabetes mellitus without complications: Secondary | ICD-10-CM | POA: Diagnosis not present

## 2019-10-15 DIAGNOSIS — J45909 Unspecified asthma, uncomplicated: Secondary | ICD-10-CM | POA: Diagnosis not present

## 2019-10-15 DIAGNOSIS — I4819 Other persistent atrial fibrillation: Secondary | ICD-10-CM | POA: Diagnosis not present

## 2019-10-18 ENCOUNTER — Encounter (HOSPITAL_COMMUNITY)
Admission: RE | Disposition: A | Discharge status: Home / Self Care | Payer: Self-pay | Source: Home / Self Care | Attending: Cardiology

## 2019-10-18 ENCOUNTER — Ambulatory Visit (HOSPITAL_COMMUNITY)
Admission: RE | Admit: 2019-10-18 | Discharge: 2019-10-18 | Disposition: A | Discharge status: Home / Self Care | Payer: Medicare Other | Attending: Cardiology | Admitting: Cardiology

## 2019-10-18 ENCOUNTER — Encounter (HOSPITAL_COMMUNITY): Payer: Self-pay | Admitting: Cardiology

## 2019-10-18 ENCOUNTER — Ambulatory Visit (HOSPITAL_COMMUNITY): Payer: Medicare Other | Admitting: Certified Registered Nurse Anesthetist

## 2019-10-18 ENCOUNTER — Other Ambulatory Visit: Payer: Self-pay

## 2019-10-18 DIAGNOSIS — Z7901 Long term (current) use of anticoagulants: Secondary | ICD-10-CM | POA: Diagnosis not present

## 2019-10-18 DIAGNOSIS — Z8249 Family history of ischemic heart disease and other diseases of the circulatory system: Secondary | ICD-10-CM | POA: Diagnosis not present

## 2019-10-18 DIAGNOSIS — E7849 Other hyperlipidemia: Secondary | ICD-10-CM | POA: Diagnosis not present

## 2019-10-18 DIAGNOSIS — I10 Essential (primary) hypertension: Secondary | ICD-10-CM | POA: Insufficient documentation

## 2019-10-18 DIAGNOSIS — J4540 Moderate persistent asthma, uncomplicated: Secondary | ICD-10-CM | POA: Diagnosis not present

## 2019-10-18 DIAGNOSIS — E119 Type 2 diabetes mellitus without complications: Secondary | ICD-10-CM | POA: Insufficient documentation

## 2019-10-18 DIAGNOSIS — E669 Obesity, unspecified: Secondary | ICD-10-CM | POA: Diagnosis not present

## 2019-10-18 DIAGNOSIS — Z794 Long term (current) use of insulin: Secondary | ICD-10-CM | POA: Diagnosis not present

## 2019-10-18 DIAGNOSIS — Z6837 Body mass index (BMI) 37.0-37.9, adult: Secondary | ICD-10-CM | POA: Diagnosis not present

## 2019-10-18 DIAGNOSIS — I4819 Other persistent atrial fibrillation: Secondary | ICD-10-CM | POA: Insufficient documentation

## 2019-10-18 DIAGNOSIS — I480 Paroxysmal atrial fibrillation: Secondary | ICD-10-CM | POA: Diagnosis not present

## 2019-10-18 DIAGNOSIS — Z79899 Other long term (current) drug therapy: Secondary | ICD-10-CM | POA: Diagnosis not present

## 2019-10-18 HISTORY — PX: CARDIOVERSION: SHX1299

## 2019-10-18 LAB — GLUCOSE, CAPILLARY: Glucose-Capillary: 189 mg/dL — ABNORMAL HIGH (ref 70–99)

## 2019-10-18 SURGERY — CARDIOVERSION
Anesthesia: General

## 2019-10-18 MED ORDER — LIDOCAINE 2% (20 MG/ML) 5 ML SYRINGE
INTRAMUSCULAR | Status: DC | PRN
  Administered 2019-10-18: 60 mg via INTRAVENOUS

## 2019-10-18 MED ORDER — SODIUM CHLORIDE 0.9 % IV SOLN: INTRAVENOUS | Status: DC | PRN

## 2019-10-18 MED ORDER — PROPOFOL 10 MG/ML IV BOLUS
INTRAVENOUS | Status: DC | PRN
  Administered 2019-10-18: 20 mg via INTRAVENOUS
  Administered 2019-10-18: 60 mg via INTRAVENOUS

## 2019-10-18 NOTE — Interval H&P Note (Signed)
History and Physical Interval Note:  10/18/2019 9:20 AM  Brandon Sanchez  has presented today for surgery, with the diagnosis of AFIB.  The various methods of treatment have been discussed with the patient and family. After consideration of risks, benefits and other options for treatment, the patient has consented to  Procedure(s): CARDIOVERSION (N/A) as a surgical intervention.  The patient's history has been reviewed, patient examined, no change in status, stable for surgery.  I have reviewed the patient's chart and labs.  Questions were answered to the patient's satisfaction.     [employer]

## 2019-10-18 NOTE — Transfer of Care (Signed)
Immediate Anesthesia Transfer of Care Note  Patient: Brandon Sanchez  Procedure(s) Performed: CARDIOVERSION (N/A )  Patient Location: Endoscopy Unit  Anesthesia Type:General  Level of Consciousness: awake, alert  and oriented  Airway & Oxygen Therapy: Patient Spontanous Breathing  Post-op Assessment: Report given to RN, Post -op Vital signs reviewed and stable and Patient moving all extremities X 4  Post vital signs: Reviewed and stable  Last Vitals:  Vitals Value Taken Time  BP    Temp    Pulse    Resp    SpO2      Last Pain:  Vitals:   10/18/19 0927  TempSrc: Oral  PainSc: 0-No pain         Complications: No apparent anesthesia complications

## 2019-10-18 NOTE — CV Procedure (Signed)
    Electrical Cardioversion Procedure Note Brandon Sanchez [id number] 05/25/1946  Procedure: Electrical Cardioversion Indications:  Atrial Fibrillation  Time Out: Verified patient identification, verified procedure,medications/allergies/relevent history reviewed, required imaging and test results available.  Performed  Procedure Details  The patient was NPO after midnight. Anesthesia was administered at the beside  by Dr.Turk with propofol.  Cardioversion was performed with synchronized biphasic defibrillation via AP pads with 120, 200, 200 joules.  3 attempt(s) were performed.  Third shock resulted in pause then sinus rhythm. The patient converted to normal sinus rhythm. The patient tolerated the procedure well.  IMPRESSION:  Successful cardioversion of atrial fibrillation on amiodarone   Donato Schultz 10/18/2019, 10:04 AM

## 2019-10-18 NOTE — Anesthesia Procedure Notes (Signed)
Procedure Name: General with mask airway Date/Time: 10/18/2019 9:55 AM Performed by: Nils Pyle, CRNA Pre-anesthesia Checklist: Patient identified, Emergency Drugs available, Suction available and Patient being monitored Patient Re-evaluated:Patient Re-evaluated prior to induction Oxygen Delivery Method: Ambu bag Preoxygenation: Pre-oxygenation with 100% oxygen Induction Type: IV induction Placement Confirmation: positive ETCO2 and breath sounds checked- equal and bilateral Dental Injury: Teeth and Oropharynx as per pre-operative assessment

## 2019-10-18 NOTE — Anesthesia Preprocedure Evaluation (Addendum)
Anesthesia Evaluation  Patient identified by MRN, date of birth, ID band Patient awake    Reviewed: Allergy & Precautions, NPO status , Patient's Chart, lab work & pertinent test results, reviewed documented beta blocker date and time   History of Anesthesia Complications Negative for: history of anesthetic complications  Airway Mallampati: III  TM Distance: >3 FB Neck ROM: Full    Dental  (+) Teeth Intact, Dental Advisory Given, Caps   Pulmonary shortness of breath, asthma ,    Pulmonary exam normal breath sounds clear to auscultation       Cardiovascular hypertension, Pt. on medications and Pt. on home beta blockers + dysrhythmias Atrial Fibrillation  Rhythm:Irregular Rate:Abnormal     Neuro/Psych negative neurological ROS  negative psych ROS   GI/Hepatic Neg liver ROS, GERD  Medicated,  Endo/Other  diabetes, Type 2, Insulin DependentObesity   Renal/GU negative Renal ROS     Musculoskeletal negative musculoskeletal ROS (+)   Abdominal   Peds  Hematology  (+) Blood dyscrasia (Eliquis), ,   Anesthesia Other Findings Day of surgery medications reviewed with the patient.  Reproductive/Obstetrics                            Anesthesia Physical Anesthesia Plan  ASA: III  Anesthesia Plan: General   Post-op Pain Management:    Induction: Intravenous  PONV Risk Score and Plan: 2 and Treatment may vary due to age or medical condition  Airway Management Planned: Mask  Additional Equipment:   Intra-op Plan:   Post-operative Plan:   Informed Consent: I have reviewed the patients History and Physical, chart, labs and discussed the procedure including the risks, benefits and alternatives for the proposed anesthesia with the patient or authorized representative who has indicated his/her understanding and acceptance.     Dental advisory given  Plan Discussed with: CRNA  Anesthesia  Plan Comments:         Anesthesia Quick Evaluation

## 2019-10-19 DIAGNOSIS — E119 Type 2 diabetes mellitus without complications: Secondary | ICD-10-CM | POA: Diagnosis not present

## 2019-10-19 DIAGNOSIS — E7849 Other hyperlipidemia: Secondary | ICD-10-CM | POA: Diagnosis not present

## 2019-10-19 DIAGNOSIS — I10 Essential (primary) hypertension: Secondary | ICD-10-CM | POA: Diagnosis not present

## 2019-10-19 DIAGNOSIS — I4819 Other persistent atrial fibrillation: Secondary | ICD-10-CM | POA: Diagnosis not present

## 2019-10-19 DIAGNOSIS — J45909 Unspecified asthma, uncomplicated: Secondary | ICD-10-CM | POA: Diagnosis not present

## 2019-10-19 DIAGNOSIS — D6869 Other thrombophilia: Secondary | ICD-10-CM | POA: Diagnosis not present

## 2019-10-19 NOTE — Anesthesia Postprocedure Evaluation (Signed)
Anesthesia Post Note  Patient: Brandon Sanchez  Procedure(s) Performed: CARDIOVERSION (N/A )     Patient location during evaluation: Endoscopy Anesthesia Type: General Level of consciousness: awake and alert Pain management: pain level controlled Vital Signs Assessment: post-procedure vital signs reviewed and stable Respiratory status: spontaneous breathing, nonlabored ventilation, respiratory function stable and patient connected to nasal cannula oxygen Cardiovascular status: blood pressure returned to baseline and stable Postop Assessment: no apparent nausea or vomiting Anesthetic complications: no    Last Vitals:  Vitals:   10/18/19 1018 10/18/19 1027  BP: 118/67 131/60  Pulse: 65 65  Resp: (!) 22 (!) 22  Temp:    SpO2: 93% 93%    Last Pain:  Vitals:   10/18/19 1027  TempSrc:   PainSc: 0-No pain                 Cecile Hearing

## 2019-10-20 NOTE — Telephone Encounter (Signed)
He cannot be off anticoagulation for cataract surgery given his uncontrolled atrial fibrillation and recent cardioversion 10/18/19. I would recommend postponing until his atrial fibrillation is stable and he can temporarily suspend anticoagulation. This would be April at the earliest.

## 2019-10-21 ENCOUNTER — Other Ambulatory Visit (HOSPITAL_COMMUNITY): Payer: Self-pay | Admitting: *Deleted

## 2019-10-21 DIAGNOSIS — E7849 Other hyperlipidemia: Secondary | ICD-10-CM | POA: Diagnosis not present

## 2019-10-21 DIAGNOSIS — E119 Type 2 diabetes mellitus without complications: Secondary | ICD-10-CM | POA: Diagnosis not present

## 2019-10-21 DIAGNOSIS — I10 Essential (primary) hypertension: Secondary | ICD-10-CM | POA: Diagnosis not present

## 2019-10-21 DIAGNOSIS — D6869 Other thrombophilia: Secondary | ICD-10-CM | POA: Diagnosis not present

## 2019-10-21 DIAGNOSIS — I4819 Other persistent atrial fibrillation: Secondary | ICD-10-CM | POA: Diagnosis not present

## 2019-10-21 DIAGNOSIS — J45909 Unspecified asthma, uncomplicated: Secondary | ICD-10-CM | POA: Diagnosis not present

## 2019-10-21 MED ORDER — AMIODARONE HCL 200 MG PO TABS: 200.0000 mg | ORAL_TABLET | Freq: Every day | ORAL | 2 refills | Status: DC

## 2019-10-21 MED ORDER — APIXABAN 5 MG PO TABS: 5.0000 mg | ORAL_TABLET | Freq: Two times a day (BID) | ORAL | 6 refills | Status: DC

## 2019-10-21 MED ORDER — METOPROLOL SUCCINATE ER 200 MG PO TB24: 200.0000 mg | ORAL_TABLET | Freq: Every day | ORAL | 6 refills | Status: DC

## 2019-10-21 NOTE — Telephone Encounter (Signed)
MD's recommendations faxed to Dr. Florence Canner office.

## 2019-10-25 ENCOUNTER — Encounter (HOSPITAL_COMMUNITY): Payer: Self-pay | Admitting: Physician Assistant

## 2019-10-25 ENCOUNTER — Other Ambulatory Visit: Payer: Self-pay

## 2019-10-25 ENCOUNTER — Ambulatory Visit (HOSPITAL_COMMUNITY)
Admission: RE | Admit: 2019-10-25 | Discharge: 2019-10-25 | Disposition: A | Discharge status: Home / Self Care | Payer: Medicare Other | Source: Ambulatory Visit | Attending: Physician Assistant | Admitting: Physician Assistant

## 2019-10-25 VITALS — BP 104/58 | HR 56 | Ht 75.0 in | Wt 292.2 lb

## 2019-10-25 DIAGNOSIS — Z8249 Family history of ischemic heart disease and other diseases of the circulatory system: Secondary | ICD-10-CM | POA: Insufficient documentation

## 2019-10-25 DIAGNOSIS — Z7984 Long term (current) use of oral hypoglycemic drugs: Secondary | ICD-10-CM | POA: Diagnosis not present

## 2019-10-25 DIAGNOSIS — Z7901 Long term (current) use of anticoagulants: Secondary | ICD-10-CM | POA: Insufficient documentation

## 2019-10-25 DIAGNOSIS — R9431 Abnormal electrocardiogram [ECG] [EKG]: Secondary | ICD-10-CM | POA: Diagnosis not present

## 2019-10-25 DIAGNOSIS — R0683 Snoring: Secondary | ICD-10-CM | POA: Insufficient documentation

## 2019-10-25 DIAGNOSIS — I10 Essential (primary) hypertension: Secondary | ICD-10-CM | POA: Diagnosis not present

## 2019-10-25 DIAGNOSIS — E119 Type 2 diabetes mellitus without complications: Secondary | ICD-10-CM | POA: Diagnosis not present

## 2019-10-25 DIAGNOSIS — Z79899 Other long term (current) drug therapy: Secondary | ICD-10-CM | POA: Insufficient documentation

## 2019-10-25 DIAGNOSIS — I4819 Other persistent atrial fibrillation: Secondary | ICD-10-CM | POA: Insufficient documentation

## 2019-10-25 DIAGNOSIS — J45909 Unspecified asthma, uncomplicated: Secondary | ICD-10-CM | POA: Insufficient documentation

## 2019-10-25 DIAGNOSIS — D6869 Other thrombophilia: Secondary | ICD-10-CM | POA: Diagnosis not present

## 2019-10-25 DIAGNOSIS — E669 Obesity, unspecified: Secondary | ICD-10-CM | POA: Diagnosis not present

## 2019-10-25 DIAGNOSIS — Z6836 Body mass index (BMI) 36.0-36.9, adult: Secondary | ICD-10-CM | POA: Insufficient documentation

## 2019-10-25 DIAGNOSIS — E785 Hyperlipidemia, unspecified: Secondary | ICD-10-CM | POA: Diagnosis not present

## 2019-10-25 NOTE — Patient Instructions (Signed)
Decrease amiodarone 200mg once a day 

## 2019-10-25 NOTE — Progress Notes (Signed)
Primary Care Physician: Merri Brunette, MD Primary Cardiologist: Dr Cristal Deer Primary Electrophysiologist: none Referring Physician: Dr Fletcher Anon is a 74 y.o. male with a history of asthma, type II diabetes, dyslipidemia, hypertension, and persistent atrial fibrillation who presents for follow up in the [facility].  The patient was initially diagnosed with atrial fibrillation on 09/20/19 after presenting with symptoms of worsening fatigue and SOB. He was found to be in afib with RVR in the setting of sepsis from CAP. Patient was started on Eliquis for a CHADS2VASC score of 3. He underwent TEE/DCCV on 09/22/19. Echo showed preserved EF 50-55% with mildly dilated LA. Since his admission, patient reports that he has had elevated heart rates at times associated with SOB. He is in afib today with RVR. He does admit to drinking 2-3 glasses of wine daily prior to his hospitalization. He also states that he snores and frequently naps during the day.  On follow up today, patient is s/p repeat DCCV 10/18/19. Patient reports that he has done well since his procedure. He reports that his SOB is back to baseline and denies any heart racing. He has still been taking amiodarone 200 mg BID.  Today, he denies symptoms of palpitations, chest pain, orthopnea, PND, dizziness, presyncope, syncope, bleeding, or neurologic sequela. The patient is tolerating medications without difficulties and is otherwise without complaint today.    Atrial Fibrillation Risk Factors:  he does have symptoms of sleep apnea. he does not have a history of rheumatic fever. he does have a history of alcohol use. The patient does not have a history of early familial atrial fibrillation or other arrhythmias.  he has a BMI of Body mass index is 36.52 kg/m.Marland Kitchen Filed Weights   10/25/19 1327  Weight: 132.5 kg    Family History  Problem Relation Age of Onset  . Colon cancer Father   . Heart  disease Paternal Grandfather      Atrial Fibrillation Management history:  Previous antiarrhythmic drugs: amiodarone Previous cardioversions: 09/22/19, 10/18/19 Previous ablations: none CHADS2VASC score: 3 Anticoagulation history: Eliquis   Past Medical History:  Diagnosis Date  . Allergic rhinitis, cause unspecified   . Asthma   . Other and unspecified hyperlipidemia   . Type II or unspecified type diabetes mellitus without mention of complication, not stated as uncontrolled   . Unspecified essential hypertension    Past Surgical History:  Procedure Laterality Date  . CARDIOVERSION N/A 09/22/2019   Procedure: CARDIOVERSION;  Surgeon: [name], MD;  Location: [facility] ENDOSCOPY;  Service: Endoscopy;  Laterality: N/A;  . CARDIOVERSION N/A 10/18/2019   Procedure: CARDIOVERSION;  Surgeon: Jake Bathe, MD;  Location: [facility] ENDOSCOPY;  Service: Cardiovascular;  Laterality: N/A;  . TEE WITHOUT CARDIOVERSION N/A 09/22/2019   Procedure: TRANSESOPHAGEAL ECHOCARDIOGRAM (TEE);  Surgeon: [name], MD;  Location: [facility] ENDOSCOPY;  Service: Endoscopy;  Laterality: N/A;    Current Outpatient Medications  Medication Sig Dispense Refill  . acetaminophen (TYLENOL) 500 MG tablet Take 1,000 mg by mouth every 6 (six) hours as needed for moderate pain or headache.     . albuterol (PROAIR HFA) 108 (90 BASE) MCG/ACT inhaler 2 puffs every 4 hours as needed only  if your can't catch your breath (Patient taking differently: Inhale 2 puffs into the lungs every 4 (four) hours as needed for wheezing. ) 1 Inhaler 1  . amiodarone (PACERONE) 200 MG tablet Take 1 tablet (200 mg total) by mouth daily. 90 tablet  2  . apixaban (ELIQUIS) 5 MG TABS tablet Take 1 tablet (5 mg total) by mouth 2 (two) times daily. 60 tablet 6  . atorvastatin (LIPITOR) 40 MG tablet Take 40 mg by mouth every evening.     Marland Kitchen BREO ELLIPTA 200-25 MCG/INH AEPB Inhale 1 puff into the lungs daily as needed (shortness of breath).      . diltiazem (CARDIZEM CD) 120 MG 24 hr capsule Take 1 capsule (120 mg total) by mouth daily. 30 capsule 3  . furosemide (LASIX) 40 MG tablet Take 1 tablet (40 mg total) by mouth 2 (two) times daily. 2 tablets in AM and 1 tablet midday. (Patient taking differently: Take 40-80 mg by mouth See admin instructions. Take 80 mg in the morning and 40 mg midday) 30 tablet 0  . Glucosamine-Chondroit-Vit C-Mn (GLUCOSAMINE CHONDR 1500 COMPLX) CAPS Take 1 capsule by mouth 2 (two) times daily.    . insulin aspart protamine- aspart (NOVOLOG MIX 70/30) (70-30) 100 UNIT/ML injection Inject 30 Units into the skin 2 (two) times daily at 8 am and 10 pm.     . metoprolol (TOPROL-XL) 200 MG 24 hr tablet Take 1 tablet (200 mg total) by mouth daily. Take with or immediately following a meal. 30 tablet 6  . Multiple Vitamin (MULTIVITAMIN WITH MINERALS) TABS tablet Take 1 tablet by mouth daily.    Marland Kitchen omeprazole (PRILOSEC) 40 MG capsule Take 40 mg by mouth daily as needed (heart burn).     Letta Pate VERIO test strip USE AS DIRECTED TO TEST BLOOD SUGAR THREE TIMES DAILY FOR 30 DAYS    . potassium chloride (KLOR-CON) 10 MEQ tablet TAKE 1 TABLET BY MOUTH TWICE DAILY FOR 3 DAYS THEN TAKE 1 TABLET BY MOUTH DAILY (Patient taking differently: Take 10 mEq by mouth See admin instructions. TAKE 10 MEQ BY MOUTH TWICE DAILY FOR 3 DAYS THEN TAKE 10 MEQ BY MOUTH DAILY) 99 tablet 0  . PREVIDENT 5000 BOOSTER PLUS 1.1 % PSTE Place 1 application onto teeth in the morning and at bedtime.     . sertraline (ZOLOFT) 100 MG tablet Take 100 mg by mouth daily.     No current facility-administered medications for this encounter.    No Known Allergies  Social History   Socioeconomic History  . Marital status: Divorced    Spouse name: Not on file  . Number of children: Not on file  . Years of education: Not on file  . Highest education level: Not on file  Occupational History  . Occupation: Armed forces logistics/support/administrative officer  Tobacco Use  . Smoking status:  Never Smoker  . Smokeless tobacco: Never Used  Substance and Sexual Activity  . Alcohol use: Not Currently    Alcohol/week: 3.0 standard drinks    Types: 3 Glasses of wine per week  . Drug use: Never  . Sexual activity: Not on file  Other Topics Concern  . Not on file  Social History Narrative  . Not on file   Social Determinants of Health   Financial Resource Strain:   . Difficulty of Paying Living Expenses: Not on file  Food Insecurity: No Food Insecurity  . Worried About Programme researcher, broadcasting/film/video in the Last Year: Never true  . Ran Out of Food in the Last Year: Never true  Transportation Needs: No Transportation Needs  . Lack of Transportation (Medical): No  . Lack of Transportation (Non-Medical): No  Physical Activity:   . Days of Exercise per Week: Not on file  .  Minutes of Exercise per Session: Not on file  Stress:   . Feeling of Stress : Not on file  Social Connections:   . Frequency of Communication with Friends and Family: Not on file  . Frequency of Social Gatherings with Friends and Family: Not on file  . Attends Religious Services: Not on file  . Active Member of Clubs or Organizations: Not on file  . Attends Banker Meetings: Not on file  . Marital Status: Not on file  Intimate Partner Violence:   . Fear of Current or Ex-Partner: Not on file  . Emotionally Abused: Not on file  . Physically Abused: Not on file  . Sexually Abused: Not on file     ROS- All systems are reviewed and negative except as per the HPI above.  Physical Exam: Vitals:   10/25/19 1327  BP: (!) 104/58  Pulse: (!) 56  Weight: 132.5 kg  Height: 6\' 3"  (1.905 m)    GEN- The patient is well appearing obese male, alert and oriented x 3 today.   HEENT-head normocephalic, atraumatic, sclera clear, conjunctiva pink, hearing intact, trachea midline. Lungs- Clear to ausculation bilaterally, normal work of breathing Heart- Regular rate and rhythm, no murmurs, rubs or gallops  GI-  soft, NT, ND, + BS Extremities- no clubbing, cyanosis. Non pitting edema MS- no significant deformity or atrophy Skin- no rash or lesion Psych- euthymic mood, full affect Neuro- strength and sensation are intact   Wt Readings from Last 3 Encounters:  10/25/19 132.5 kg  10/18/19 128.8 kg  10/12/19 134.7 kg    EKG today demonstrates SB HR 56, LAD, lateral T wave inversion, PR 166, QRS 102, QTc 536  Echo 09/20/19 demonstrated  1. Left ventricular ejection fraction, by visual estimation, is 50 to  55%. The left ventricle has normal function. There is mildly increased  left ventricular hypertrophy.  2. Definity contrast agent was given IV to delineate the left ventricular  endocardial borders.  3. Left ventricular diastolic function could not be evaluated.  4. Mildly dilated left ventricular internal cavity size.  5. The left ventricle has no regional wall motion abnormalities.  6. Global right ventricle has normal systolic function.The right  ventricular size is mildly enlarged.  7. Left atrial size was mildly dilated.  8. Right atrial size was mildly dilated.  9. Mild mitral annular calcification.  10. Trivial mitral valve regurgitation. No evidence of mitral stenosis.  11. The tricuspid valve is normal in structure. Tricuspid valve  regurgitation is trivial.  12. The aortic valve was not well visualized. Aortic valve regurgitation  is not visualized. Mild to moderate aortic valve stenosis.  13. The pulmonic valve was not well visualized. Pulmonic valve  regurgitation is not visualized.  14. The inferior vena cava is dilated in size with <50% respiratory  variability, suggesting right atrial pressure of 15 mmHg.  15. Technically difficult; definity used; low normal LV systolic function;  mild LVH; mild LVE; calcified aortic valve with mild to moderate AS (mean  gradient 19 mmHg); mild LAE/RAE/RVE.   Epic records are reviewed at length today  CHA2DS2-VASc Score = 3 The  patient's score is based upon: CHF History: No HTN History: Yes Age : 37-75 Diabetes History: Yes Stroke History: No Vascular Disease History: No Gender: Male   ASSESSMENT AND PLAN: 1. Persistent Atrial Fibrillation (ICD10:  I48.19) The patient's CHA2DS2-VASc score is 3, indicating a 3.2% annual risk of stroke.   S/p DCCV on 10/18/19. Patient appears to be  maintaining SR with symptom resolution. Decrease amiodarone to 200 mg daily. Discussed decreasing rate control medications and patient prefers to continue both metoprolol and diltiazem because he feels so well.  Continue diltiazem 120 mg daily. Continue Eliquis 5 mg BID Continue Toprol 200 mg daily  2. Secondary Hypercoagulable State (ICD10:  D68.69) The patient is at significant risk for stroke/thromboembolism based upon his CHA2DS2-VASc Score of 3.  Continue Apixaban (Eliquis).   3. Obesity Body mass index is 36.52 kg/m. Lifestyle modification was discussed and encouraged including regular physical activity and weight reduction. Patient trying to walk more.  4. Suspected Obstructive sleep apnea The importance of adequate treatment of sleep apnea was discussed today in order to improve our ability to maintain sinus rhythm long term. Patient to reschedule sleep study.   5. HTN Stable, no changes today.  6. Abnormal ECG T wave inversions laterally noted. Different from ECG in SR on 09/22/19. He denies any chest pain or anginal symptoms. Defer to Dr Harrell Gave.    Follow up with Dr Harrell Gave as scheduled. AF clinic in 4 months.    [facility] [address] [place], [place] [address] [phone] 10/25/2019 2:03 PM

## 2019-10-27 DIAGNOSIS — N3000 Acute cystitis without hematuria: Secondary | ICD-10-CM | POA: Diagnosis not present

## 2019-10-27 DIAGNOSIS — R350 Frequency of micturition: Secondary | ICD-10-CM | POA: Diagnosis not present

## 2019-10-28 ENCOUNTER — Telehealth (HOSPITAL_COMMUNITY): Payer: Self-pay | Admitting: *Deleted

## 2019-10-28 NOTE — Telephone Encounter (Signed)
Patient's daughter calls in with report of continued hypotension today BP was 96/60 HR in the 60s. Pt intermittently dizzy with standing. Discussed with Jorja Loa PA - will stop diltiazem. Daughter verbalized understanding.

## 2019-10-29 DIAGNOSIS — E119 Type 2 diabetes mellitus without complications: Secondary | ICD-10-CM | POA: Diagnosis not present

## 2019-10-29 DIAGNOSIS — D6869 Other thrombophilia: Secondary | ICD-10-CM | POA: Diagnosis not present

## 2019-10-29 DIAGNOSIS — I10 Essential (primary) hypertension: Secondary | ICD-10-CM | POA: Diagnosis not present

## 2019-10-29 DIAGNOSIS — J45909 Unspecified asthma, uncomplicated: Secondary | ICD-10-CM | POA: Diagnosis not present

## 2019-10-29 DIAGNOSIS — I4819 Other persistent atrial fibrillation: Secondary | ICD-10-CM | POA: Diagnosis not present

## 2019-10-29 DIAGNOSIS — E7849 Other hyperlipidemia: Secondary | ICD-10-CM | POA: Diagnosis not present

## 2019-11-02 ENCOUNTER — Encounter: Payer: Self-pay | Admitting: Cardiology

## 2019-11-02 ENCOUNTER — Other Ambulatory Visit: Payer: Self-pay

## 2019-11-02 ENCOUNTER — Ambulatory Visit (INDEPENDENT_AMBULATORY_CARE_PROVIDER_SITE_OTHER): Payer: Medicare Other | Admitting: Cardiology

## 2019-11-02 VITALS — BP 84/56 | HR 68 | Ht 75.0 in | Wt 282.2 lb

## 2019-11-02 DIAGNOSIS — R600 Localized edema: Secondary | ICD-10-CM

## 2019-11-02 DIAGNOSIS — Z7189 Other specified counseling: Secondary | ICD-10-CM

## 2019-11-02 DIAGNOSIS — I63131 Cerebral infarction due to embolism of right carotid artery: Secondary | ICD-10-CM | POA: Diagnosis not present

## 2019-11-02 DIAGNOSIS — I480 Paroxysmal atrial fibrillation: Secondary | ICD-10-CM

## 2019-11-02 DIAGNOSIS — I4819 Other persistent atrial fibrillation: Secondary | ICD-10-CM

## 2019-11-02 DIAGNOSIS — I959 Hypotension, unspecified: Secondary | ICD-10-CM | POA: Diagnosis not present

## 2019-11-02 MED ORDER — FUROSEMIDE 40 MG PO TABS: 80.0000 mg | ORAL_TABLET | Freq: Every day | ORAL | Status: DC

## 2019-11-02 MED ORDER — METOPROLOL SUCCINATE ER 100 MG PO TB24: 100.0000 mg | ORAL_TABLET | Freq: Every day | ORAL | 11 refills | Status: DC

## 2019-11-02 NOTE — Progress Notes (Signed)
Cardiology Office Note:    Date:  11/02/2019   ID:  Brandon Sanchez, DOB 22-Dec-1945, MRN [id number]  PCP:  Merri Brunette, MD  Cardiologist:  Jodelle Red, MD  Referring MD: Merri Brunette, MD   CC: follow up  History of Present Illness:    Brandon Sanchez is a 74 y.o. male with a hx of asthma, type II diabetes, hypertension, hyperlipidemia, recent diagnosis of atrial fibrillation who is seen for follow up.   Cardiac history: admitted 09/19/19 with atrial fibrillation with RVR. Has known history of type II diabetes, hypertension, and hyperlipidemia. He was also treated for community acquired pneumonia during that admission. He was started on anticoagulation, and rate control was attempted. Course was complicated by hypotension. He underwent TEE-CV with initial return to sinus rhythm. However after discharge, he returned to afib with RVR.   Today: Daughter Brandon Sanchez on the phone via conference call, present for 33 minutes of the total 42 minute visit.  Continues to have frequent urination. Had a UTI, but frequent urination has continued. He has been on this dose of furosemide for years but recently has had a feeling of frequency (every 15 minutes) and urgency, but makes little urine. Continues to be mildly uncomfortable with urination. Saw urology last week, told his urine did not look infected.  Continue to have low blood pressure. Systolic blood pressures have consistently been in the 90s. Stopped taking diltiazem 4 days ago when systolics remained in the 90s with the home nurse. BP remains low today. HR remains consistent at home since his cardioversion.  We discussed options moving forward, including referral to EP, cutting back on metoprolol.  Has lost 30-40 lbs due to lack of appetite. He feels he has dry mouth frequently. With weight loss, we discussed that some of this may be dehydration.  He has chronic shortness of breath but is back to his baseline.  Past Medical History:    Diagnosis Date  . Allergic rhinitis, cause unspecified   . Asthma   . Other and unspecified hyperlipidemia   . Type II or unspecified type diabetes mellitus without mention of complication, not stated as uncontrolled   . Unspecified essential hypertension     Past Surgical History:  Procedure Laterality Date  . CARDIOVERSION N/A 09/22/2019   Procedure: CARDIOVERSION;  Surgeon: [name], MD;  Location: [facility] ENDOSCOPY;  Service: Endoscopy;  Laterality: N/A;  . CARDIOVERSION N/A 10/18/2019   Procedure: CARDIOVERSION;  Surgeon: Jake Bathe, MD;  Location: [facility] ENDOSCOPY;  Service: Cardiovascular;  Laterality: N/A;  . TEE WITHOUT CARDIOVERSION N/A 09/22/2019   Procedure: TRANSESOPHAGEAL ECHOCARDIOGRAM (TEE);  Surgeon: [name], MD;  Location: [facility] ENDOSCOPY;  Service: Endoscopy;  Laterality: N/A;    Current Medications: Current Outpatient Medications on File Prior to Visit  Medication Sig  . acetaminophen (TYLENOL) 500 MG tablet Take 1,000 mg by mouth every 6 (six) hours as needed for moderate pain or headache.   . albuterol (PROAIR HFA) 108 (90 BASE) MCG/ACT inhaler 2 puffs every 4 hours as needed only  if your can't catch your breath (Patient taking differently: Inhale 2 puffs into the lungs every 4 (four) hours as needed for wheezing. )  . amiodarone (PACERONE) 200 MG tablet Take 1 tablet (200 mg total) by mouth daily.  Marland Kitchen apixaban (ELIQUIS) 5 MG TABS tablet Take 1 tablet (5 mg total) by mouth 2 (two) times daily.  Marland Kitchen atorvastatin (LIPITOR) 40 MG tablet Take 40 mg by mouth every evening.   Marland Kitchen  BREO ELLIPTA 200-25 MCG/INH AEPB Inhale 1 puff into the lungs daily as needed (shortness of breath).   . furosemide (LASIX) 40 MG tablet Take 2 tablets(80 mg) in the morning and 1 tablet(40 mg) at midday  . Glucosamine-Chondroit-Vit C-Mn (GLUCOSAMINE CHONDR 1500 COMPLX) CAPS Take 1 capsule by mouth 2 (two) times daily.  . insulin aspart protamine- aspart (NOVOLOG MIX 70/30) (70-30)  100 UNIT/ML injection Inject 30 Units into the skin 2 (two) times daily at 8 am and 10 pm.   . metoprolol (TOPROL-XL) 200 MG 24 hr tablet Take 1 tablet (200 mg total) by mouth daily. Take with or immediately following a meal.  . Multiple Vitamin (MULTIVITAMIN WITH MINERALS) TABS tablet Take 1 tablet by mouth daily.  Marland Kitchen omeprazole (PRILOSEC) 40 MG capsule Take 40 mg by mouth daily as needed (heart burn).   Letta Pate VERIO test strip USE AS DIRECTED TO TEST BLOOD SUGAR THREE TIMES DAILY FOR 30 DAYS  . potassium chloride (KLOR-CON) 10 MEQ tablet Take 10 mEq by mouth at bedtime.  Marland Kitchen PREVIDENT 5000 BOOSTER PLUS 1.1 % PSTE Place 1 application onto teeth in the morning and at bedtime.   . sertraline (ZOLOFT) 100 MG tablet Take 100 mg by mouth daily.   No current facility-administered medications on file prior to visit.     Allergies:   Patient has no known allergies.   Social History   Tobacco Use  . Smoking status: Never Smoker  . Smokeless tobacco: Never Used  Substance Use Topics  . Alcohol use: Not Currently    Alcohol/week: 3.0 standard drinks    Types: 3 Glasses of wine per week  . Drug use: Never    Family History: family history includes Colon cancer in his father; Heart disease in his paternal grandfather.  ROS:   Please see the history of present illness.  Additional pertinent ROS negative except as noted in HPI   EKGs/Labs/Other Studies Reviewed:    The following studies were reviewed today: Echo 09/20/19 1. Left ventricular ejection fraction, by visual estimation, is 50 to  55%. The left ventricle has normal function. There is mildly increased  left ventricular hypertrophy.  2. Definity contrast agent was given IV to delineate the left ventricular  endocardial borders.  3. Left ventricular diastolic function could not be evaluated.  4. Mildly dilated left ventricular internal cavity size.  5. The left ventricle has no regional wall motion abnormalities.  6. Global  right ventricle has normal systolic function.The right  ventricular size is mildly enlarged.  7. Left atrial size was mildly dilated.  8. Right atrial size was mildly dilated.  9. Mild mitral annular calcification.  10. Trivial mitral valve regurgitation. No evidence of mitral stenosis.  11. The tricuspid valve is normal in structure. Tricuspid valve  regurgitation is trivial.  12. The aortic valve was not well visualized. Aortic valve regurgitation  is not visualized. Mild to moderate aortic valve stenosis.  13. The pulmonic valve was not well visualized. Pulmonic valve  regurgitation is not visualized.  14. The inferior vena cava is dilated in size with <50% respiratory  variability, suggesting right atrial pressure of 15 mmHg.  15. Technically difficult; definity used; low normal LV systolic function;  mild LVH; mild LVE; calcified aortic valve with mild to moderate AS (mean  gradient 19 mmHg); mild LAE/RAE/RVE.   EKG:  EKG is personally reviewed.  The ekg ordered today demonstrates NSR, PRWP, long QT  Recent Labs: 09/20/2019: B Natriuretic Peptide 289.1 09/22/2019:  Magnesium 2.3 09/28/2019: ALT 33; TSH 2.357 10/12/2019: BUN 17; Creatinine, Ser 1.70; Hemoglobin 13.6; Platelets 311; Potassium 3.3; Sodium 140  Recent Lipid Panel No results found for: CHOL, TRIG, HDL, CHOLHDL, VLDL, LDLCALC, LDLDIRECT  Physical Exam:    VS:  BP (!) 84/56   Pulse 68   Ht 6\' 3"  (1.905 m)   Wt 282 lb 3.2 oz (128 kg)   BMI 35.27 kg/m     Wt Readings from Last 3 Encounters:  11/02/19 282 lb 3.2 oz (128 kg)  10/25/19 292 lb 3.2 oz (132.5 kg)  10/18/19 284 lb (128.8 kg)    GEN: Well nourished, well developed in no acute distress HEENT: Normal, moist mucous membranes NECK: No JVD CARDIAC: regular rhythm, normal S1 and S2, no rubs or gallops. No murmur. VASCULAR: Radial and DP pulses 2+ bilaterally. No carotid bruits RESPIRATORY:  Clear to auscultation without rales, wheezing or rhonchi  ABDOMEN:  Soft, non-tender, non-distended MUSCULOSKELETAL:  Ambulates independently SKIN: Warm and dry, trivial bilateral LE edema NEUROLOGIC:  Alert and oriented x 3. No focal neuro deficits noted. PSYCHIATRIC:  Normal affect   ASSESSMENT:    No diagnosis found. PLAN:    Paroxysmal atrial fibrillation with RVR:  -has remained in NSR post cardioversion on amiodarone. In NSR today. -on apixaban for anticoagulation -he feels poorly when in afib, and his blood pressure limits management options. We discussed today, and he is amenable to EP referral -instructed on red flag warning signs that need immediate medical attention  LE edema: improved -given that he has lost weight and is hypotensive, concern for overdiuresis. Would continue with AM lasix dose, drop PM lasix unless he is symptomatic  Hypotension: has been running low, symptoms stable. Limits medical therapy options -cutting back metoprolol from 200 mg to 100 mg today  Possible OSA: sleep study ordered, would benefit from CPAP if sleep apnea found  CV risk counseling and prevention: -recommend heart healthy/Mediterranean diet, with whole grains, fruits, vegetable, fish, lean meats, nuts, and olive oil. Limit salt. -recommend moderate walking, 3-5 times/week for 30-50 minutes each session. Aim for at least 150 minutes.week. Goal should be pace of 3 miles/hours, or walking 1.5 miles in 30 minutes -recommend avoidance of tobacco products. Avoid excess alcohol.  Plan for follow up: 1-2 weeks  12/18/19, MD, PhD   [facility]    Medication Adjustments/Labs and Tests Ordered: Current medicines are reviewed at length with the patient today.  Concerns regarding medicines are outlined above.  Orders Placed This Encounter  Procedures  . Ambulatory referral to Cardiac Electrophysiology  . EKG 12-Lead   Meds ordered this encounter  Medications  . DISCONTD: furosemide (LASIX) 40 MG tablet    Sig: Take 2 tablets  (80 mg total) by mouth daily. May take additional 40 mg if notice swelling of shortness of breath    Dispense:  30 tablet  . DISCONTD: metoprolol (TOPROL-XL) 100 MG 24 hr tablet    Sig: Take 1 tablet (100 mg total) by mouth daily. Take with or immediately following a meal.    Dispense:  30 tablet    Refill:  11    Patient Instructions  Medication Instructions:  Try not taking the afternoon dose of lasix, given your weight loss. Continue to take your morning dose. If you feel well with only the morning dose, then this may be better for you long term. If you notice shortness of breath or worsening leg swelling, then I would return to the 80  mg in the morning and 40 mg in the afternoon for the furosemide.  We will also cut back to 100 mg daily of metoprolol (from 200 mg daily).  *If you need a refill on your cardiac medications before your next appointment, please call your pharmacy*   Lab Work: None  Testing/Procedures: None   Follow-Up: At [facility], you and your health needs are our priority.  As part of our continuing mission to provide you with exceptional heart care, we have created designated Provider Care Teams.  These Care Teams include your primary Cardiologist (physician) and Advanced Practice Providers (APPs -  Physician Assistants and Nurse Practitioners) who all work together to provide you with the care you need, when you need it.  We recommend signing up for the patient portal called "MyChart".  Sign up information is provided on this After Visit Summary.  MyChart is used to connect with patients for Virtual Visits (Telemedicine).  Patients are able to view lab/test results, encounter notes, upcoming appointments, etc.  Non-urgent messages can be sent to your provider as well.   To learn more about what you can do with MyChart, go to [email or web address].    Your next appointment:   1-2 week(s)  The format for your next appointment:   In Person  Provider:     APP  Your physician recommends that you schedule a follow-up appointment 6 weeks with Dr. Harrell Gave.       Signed, Buford Dresser, MD PhD 11/02/2019   [place]

## 2019-11-02 NOTE — Patient Instructions (Addendum)
Medication Instructions:  Try not taking the afternoon dose of lasix, given your weight loss. Continue to take your morning dose. If you feel well with only the morning dose, then this may be better for you long term. If you notice shortness of breath or worsening leg swelling, then I would return to the 80 mg in the morning and 40 mg in the afternoon for the furosemide.  We will also cut back to 100 mg daily of metoprolol (from 200 mg daily).  *If you need a refill on your cardiac medications before your next appointment, please call your pharmacy*   Lab Work: None  Testing/Procedures: None   Follow-Up: At [facility], you and your health needs are our priority.  As part of our continuing mission to provide you with exceptional heart care, we have created designated Provider Care Teams.  These Care Teams include your primary Cardiologist (physician) and Advanced Practice Providers (APPs -  Physician Assistants and Nurse Practitioners) who all work together to provide you with the care you need, when you need it.  We recommend signing up for the patient portal called "MyChart".  Sign up information is provided on this After Visit Summary.  MyChart is used to connect with patients for Virtual Visits (Telemedicine).  Patients are able to view lab/test results, encounter notes, upcoming appointments, etc.  Non-urgent messages can be sent to your provider as well.   To learn more about what you can do with MyChart, go to [email or web address].    Your next appointment:   1-2 week(s)  The format for your next appointment:   In Person  Provider:   APP  Your physician recommends that you schedule a follow-up appointment 6 weeks with Dr. Cristal Deer.

## 2019-11-05 DIAGNOSIS — D6869 Other thrombophilia: Secondary | ICD-10-CM | POA: Diagnosis not present

## 2019-11-05 DIAGNOSIS — E119 Type 2 diabetes mellitus without complications: Secondary | ICD-10-CM | POA: Diagnosis not present

## 2019-11-05 DIAGNOSIS — I4819 Other persistent atrial fibrillation: Secondary | ICD-10-CM | POA: Diagnosis not present

## 2019-11-05 DIAGNOSIS — E7849 Other hyperlipidemia: Secondary | ICD-10-CM | POA: Diagnosis not present

## 2019-11-05 DIAGNOSIS — I10 Essential (primary) hypertension: Secondary | ICD-10-CM | POA: Diagnosis not present

## 2019-11-05 DIAGNOSIS — J45909 Unspecified asthma, uncomplicated: Secondary | ICD-10-CM | POA: Diagnosis not present

## 2019-11-07 DIAGNOSIS — Z7901 Long term (current) use of anticoagulants: Secondary | ICD-10-CM | POA: Diagnosis not present

## 2019-11-07 DIAGNOSIS — E7849 Other hyperlipidemia: Secondary | ICD-10-CM | POA: Diagnosis not present

## 2019-11-07 DIAGNOSIS — J45909 Unspecified asthma, uncomplicated: Secondary | ICD-10-CM | POA: Diagnosis not present

## 2019-11-07 DIAGNOSIS — Z9181 History of falling: Secondary | ICD-10-CM | POA: Diagnosis not present

## 2019-11-07 DIAGNOSIS — I083 Combined rheumatic disorders of mitral, aortic and tricuspid valves: Secondary | ICD-10-CM | POA: Diagnosis not present

## 2019-11-07 DIAGNOSIS — F329 Major depressive disorder, single episode, unspecified: Secondary | ICD-10-CM | POA: Diagnosis not present

## 2019-11-07 DIAGNOSIS — I10 Essential (primary) hypertension: Secondary | ICD-10-CM | POA: Diagnosis not present

## 2019-11-07 DIAGNOSIS — D6869 Other thrombophilia: Secondary | ICD-10-CM | POA: Diagnosis not present

## 2019-11-07 DIAGNOSIS — Z8701 Personal history of pneumonia (recurrent): Secondary | ICD-10-CM | POA: Diagnosis not present

## 2019-11-07 DIAGNOSIS — I4819 Other persistent atrial fibrillation: Secondary | ICD-10-CM | POA: Diagnosis not present

## 2019-11-07 DIAGNOSIS — Z794 Long term (current) use of insulin: Secondary | ICD-10-CM | POA: Diagnosis not present

## 2019-11-07 DIAGNOSIS — Z7951 Long term (current) use of inhaled steroids: Secondary | ICD-10-CM | POA: Diagnosis not present

## 2019-11-07 DIAGNOSIS — E669 Obesity, unspecified: Secondary | ICD-10-CM | POA: Diagnosis not present

## 2019-11-07 DIAGNOSIS — Z6837 Body mass index (BMI) 37.0-37.9, adult: Secondary | ICD-10-CM | POA: Diagnosis not present

## 2019-11-07 DIAGNOSIS — E119 Type 2 diabetes mellitus without complications: Secondary | ICD-10-CM | POA: Diagnosis not present

## 2019-11-08 ENCOUNTER — Telehealth: Payer: Self-pay | Admitting: Cardiology

## 2019-11-08 NOTE — Telephone Encounter (Signed)
New Message:     Pt have an 11:00 appt tomorrow with Dr Elberta Fortis. He says he will need a wheelchair. His driver will drive him up to the dorr and will need a wheelchair.

## 2019-11-09 ENCOUNTER — Other Ambulatory Visit: Payer: Self-pay

## 2019-11-09 ENCOUNTER — Other Ambulatory Visit: Payer: Self-pay | Admitting: *Deleted

## 2019-11-09 ENCOUNTER — Encounter: Payer: Self-pay | Admitting: Cardiology

## 2019-11-09 ENCOUNTER — Telehealth: Payer: Self-pay | Admitting: *Deleted

## 2019-11-09 ENCOUNTER — Telehealth: Payer: Self-pay | Admitting: Cardiology

## 2019-11-09 ENCOUNTER — Ambulatory Visit (INDEPENDENT_AMBULATORY_CARE_PROVIDER_SITE_OTHER): Payer: Medicare Other | Admitting: Cardiology

## 2019-11-09 VITALS — BP 104/60 | HR 64 | Ht 75.0 in | Wt 280.4 lb

## 2019-11-09 DIAGNOSIS — I4819 Other persistent atrial fibrillation: Secondary | ICD-10-CM

## 2019-11-09 MED ORDER — METOPROLOL SUCCINATE ER 50 MG PO TB24: 50.0000 mg | ORAL_TABLET | Freq: Every day | ORAL | 1 refills | Status: DC

## 2019-11-09 NOTE — Patient Outreach (Signed)
[facility]) Care Management  11/09/2019  Brandon Sanchez 06/24/1946 [id number]   Call placed to member to follow up on current management of medical conditions.  He report he is doing much better.  Was seen by cardiology on 3/16, blood pressure had been running low and medication doses were adjusted (Metoprolol and Furosemide).  Report blood pressure has improved since dose change, has been having it checked by home health nurse, state it was 130/80 today.  He has not been checking himself daily because he feel his machine is not working properly.  State daughter has ordered him a new one that will be arriving today.  Difference between arm cuff and wrist cuff explained as he is not sure which one he will be receiving.  Advised that if he is unable to work the machine to take it with him during his next office visit, which is 4/2.  He is having a consult for a possible ablation for persistent atrial fib.  Denies he has had any chest pain but does still have some shortness of breath on exertion.  Has not had any weight gain or swelling in his extremities.  He can't remember when his next appointment with PCP is but state he has appointment with endocrinologist on 4/5 and they are in the same building, will schedule visit at that time.    Denies any urgent concerns, agree to follow up within the next month.  [facility] CM Care Plan Problem One     Most Recent Value  Care Plan Problem One  Risk for hospitalization related to A-fib as evidenced by recent admission  Role Documenting the Problem One  Care Management [place] for Problem One  Active  [facility] Long Term Goal   Member will not be readmitted to hospital within the next 31 days  [facility] Long Term Goal Start Date  09/27/19  [facility] Long Term Goal Met Date  11/09/19  [facility] CM Short Term Goal #1   Member will report checking blood pressure and heart rate daily over the next 4 weeks  [facility] CM Short Term Goal #1 Start Date  11/09/19  [Date reset]  Interventions for Short Term Goal #1  Educated on correct use of BP machine (wrist and arm cuff) advised to take to MD visit to confirm proper use  [facility] CM Short Term Goal #2   Member will have appointment scheduled with PCP within the next 3 weeks  [facility] CM Short Term Goal #2 Start Date  11/09/19  Interventions for Short Term Goal #2  Reminded of importance of seeing PCP on a regular basis in effort to manage chronic medical conditions  [facility] CM Short Term Goal #3  Member will report taking medications as instructed over the next 4 weeks  [facility] CM Short Term Goal #3 Start Date  10/11/19  [facility] CM Short Term Goal #3 Met Date  11/09/19     Valente David, RN, MSN [place] Manager [phone]

## 2019-11-09 NOTE — Telephone Encounter (Signed)
Pt c/o medication issue:  1. Name of Medication: metoprolol succinate (TOPROL-XL) 50 MG 24 hr tablet  2. How are you currently taking this medication (dosage and times per day)? 1 100 MG tablet by mouth daily   3. Are you having a reaction (difficulty breathing--STAT)? No   4. What is your medication issue? Foye Clock is calling due to Dr. Elberta Fortis changing Sarkis's metoprolol from 100 MG's to 50 MG's today due to him having low BP. She states she just wanted to clarify this with Dr. Cristal Deer before they picked it up from the pharmacy and he began taking it. Please advise.

## 2019-11-09 NOTE — Telephone Encounter (Signed)
Spoke to daughter,aware Dr  Cristal Deer is not in office today.  RN did let daughter know that  Dr Elberta Fortis did send  Report of  Office visit for Dr Cristal Deer .  daughter just wanted Dr Cristal Deer to know that last week blood pressure was 130 /80 ,but lower at the office today with Dr Elberta Fortis. Pressure was only taken once last week.    She states she was fearful that changing medication would affect his heart going into afib.   RN informed daughter will defer to Dr Cristal Deer   And some one will call daughter back with response.  Verbalized understanding

## 2019-11-09 NOTE — Telephone Encounter (Signed)
Staff message sent to Nina ok to schedule sleep study. No PA is required. 

## 2019-11-09 NOTE — Progress Notes (Signed)
Electrophysiology Office Note   Date:  11/09/2019   ID:  Brandon, Sanchez 12/19/1945, MRN [id number]  PCP:  Deland Pretty, MD  Cardiologist:  Harrell Gave Primary Electrophysiologist:  Shariyah Eland Meredith Leeds, MD    Chief Complaint: AF   History of Present Illness: Brandon Sanchez is a 74 y.o. male who is being seen today for the evaluation of AF at the request of Buford Dresser,*. Presenting today for electrophysiology evaluation.  He has a history significant for asthma, type 2 diabetes, hypertension, hyperlipidemia.  He has a recent diagnosis of atrial fibrillation.  He presented to the hospital 09/19/2019.  He was admitted with atrial fibrillation and rapid rates.  He underwent TEE and cardioversion.  He unfortunately went back into atrial fibrillation.  He was loaded on amiodarone and is now status post cardioversion 10/18/2019.  Today, he denies symptoms of palpitations, chest pain, shortness of breath, orthopnea, PND, lower extremity edema, claudication, dizziness, presyncope, syncope, bleeding, or neurologic sequela. The patient is tolerating medications without difficulties.  Since his most recent cardioversion, he is remained in normal rhythm.  He is in a wheelchair, but he has been in a wheelchair since his hospitalization in February.  His blood pressures have been low as well.  He feels weak and fatigued sometimes.  He is able to ambulate around his apartment.  He does have a 2 story townhouse and does climb up to the second floor at times.   Past Medical History:  Diagnosis Date  . Allergic rhinitis, cause unspecified   . Asthma   . Other and unspecified hyperlipidemia   . Type II or unspecified type diabetes mellitus without mention of complication, not stated as uncontrolled   . Unspecified essential hypertension    Past Surgical History:  Procedure Laterality Date  . CARDIOVERSION N/A 09/22/2019   Procedure: CARDIOVERSION;  Surgeon: Donato Heinz, MD;   Location: [facility] ENDOSCOPY;  Service: Endoscopy;  Laterality: N/A;  . CARDIOVERSION N/A 10/18/2019   Procedure: CARDIOVERSION;  Surgeon: Jerline Pain, MD;  Location: [facility] ENDOSCOPY;  Service: Cardiovascular;  Laterality: N/A;  . TEE WITHOUT CARDIOVERSION N/A 09/22/2019   Procedure: TRANSESOPHAGEAL ECHOCARDIOGRAM (TEE);  Surgeon: Donato Heinz, MD;  Location: [facility] ENDOSCOPY;  Service: Endoscopy;  Laterality: N/A;     Current Outpatient Medications  Medication Sig Dispense Refill  . acetaminophen (TYLENOL) 500 MG tablet Take 1,000 mg by mouth every 6 (six) hours as needed for moderate pain or headache.     . albuterol (PROAIR HFA) 108 (90 BASE) MCG/ACT inhaler 2 puffs every 4 hours as needed only  if your can't catch your breath (Patient taking differently: Inhale 2 puffs into the lungs every 4 (four) hours as needed for wheezing. ) 1 Inhaler 1  . amiodarone (PACERONE) 200 MG tablet Take 1 tablet (200 mg total) by mouth daily. 90 tablet 2  . apixaban (ELIQUIS) 5 MG TABS tablet Take 1 tablet (5 mg total) by mouth 2 (two) times daily. 60 tablet 6  . atorvastatin (LIPITOR) 40 MG tablet Take 40 mg by mouth every evening.     Marland Kitchen BREO ELLIPTA 200-25 MCG/INH AEPB Inhale 1 puff into the lungs daily as needed (shortness of breath).     . furosemide (LASIX) 40 MG tablet Take 2 tablets (80 mg total) by mouth daily. May take additional 40 mg if notice swelling of shortness of breath 30 tablet   . Glucosamine-Chondroit-Vit C-Mn (GLUCOSAMINE CHONDR 1500 COMPLX) CAPS Take 1 capsule by mouth 2 (two)  times daily.    . insulin aspart protamine- aspart (NOVOLOG MIX 70/30) (70-30) 100 UNIT/ML injection Inject 30 Units into the skin 2 (two) times daily at 8 am and 10 pm.     . Multiple Vitamin (MULTIVITAMIN WITH MINERALS) TABS tablet Take 1 tablet by mouth daily.    Marland Kitchen omeprazole (PRILOSEC) 40 MG capsule Take 40 mg by mouth daily as needed (heart burn).     Letta Pate VERIO test strip USE AS DIRECTED TO TEST BLOOD SUGAR  THREE TIMES DAILY FOR 30 DAYS    . potassium chloride (KLOR-CON) 10 MEQ tablet Take 10 mEq by mouth at bedtime.    Marland Kitchen PREVIDENT 5000 BOOSTER PLUS 1.1 % PSTE Place 1 application onto teeth in the morning and at bedtime.     . sertraline (ZOLOFT) 100 MG tablet Take 100 mg by mouth daily.    . metoprolol succinate (TOPROL-XL) 50 MG 24 hr tablet Take 1 tablet (50 mg total) by mouth daily. Take with or immediately following a meal. 90 tablet 1   No current facility-administered medications for this visit.    Allergies:   Patient has no known allergies.   Social History:  The patient  reports that he has never smoked. He has never used smokeless tobacco. He reports previous alcohol use of about 3.0 standard drinks of alcohol per week. He reports that he does not use drugs.   Family History:  The patient's family history includes Colon cancer in his father; Heart disease in his paternal grandfather.    ROS:  Please see the history of present illness.   Otherwise, review of systems is positive for none.   All other systems are reviewed and negative.    PHYSICAL EXAM: VS:  BP 104/60   Pulse 64   Ht 6\' 3"  (1.905 m)   Wt 280 lb 6.4 oz (127.2 kg)   SpO2 93%   BMI 35.05 kg/m  , BMI Body mass index is 35.05 kg/m. GEN: Well nourished, well developed, in no acute distress  HEENT: normal  Neck: no JVD, carotid bruits, or masses Cardiac: RRR; no murmurs, rubs, or gallops,no edema  Respiratory:  clear to auscultation bilaterally, normal work of breathing GI: soft, nontender, nondistended, + BS MS: no deformity or atrophy  Skin: warm and dry Neuro:  Strength and sensation are intact Psych: euthymic mood, full affect  EKG:  EKG is not ordered today. Personal review of the ekg ordered 10/25/2019 shows sinus rhythm, rate 56  Recent Labs: 09/20/2019: B Natriuretic Peptide 289.1 09/22/2019: Magnesium 2.3 09/28/2019: ALT 33; TSH 2.357 10/12/2019: BUN 17; Creatinine, Ser 1.70; Hemoglobin 13.6; Platelets  311; Potassium 3.3; Sodium 140    Lipid Panel  No results found for: CHOL, TRIG, HDL, CHOLHDL, VLDL, LDLCALC, LDLDIRECT   Wt Readings from Last 3 Encounters:  11/09/19 280 lb 6.4 oz (127.2 kg)  11/02/19 282 lb 3.2 oz (128 kg)  10/25/19 292 lb 3.2 oz (132.5 kg)      Other studies Reviewed: Additional studies/ records that were reviewed today include: TTE 09/20/19  Review of the above records today demonstrates:  1. Left ventricular ejection fraction, by visual estimation, is 50 to  55%. The left ventricle has normal function. There is mildly increased  left ventricular hypertrophy.  2. Definity contrast agent was given IV to delineate the left ventricular  endocardial borders.  3. Left ventricular diastolic function could not be evaluated.  4. Mildly dilated left ventricular internal cavity size.  5. The left  ventricle has no regional wall motion abnormalities.  6. Global right ventricle has normal systolic function.The right  ventricular size is mildly enlarged.  7. Left atrial size was mildly dilated.  8. Right atrial size was mildly dilated.  9. Mild mitral annular calcification.  10. Trivial mitral valve regurgitation. No evidence of mitral stenosis.  11. The tricuspid valve is normal in structure. Tricuspid valve  regurgitation is trivial.  12. The aortic valve was not well visualized. Aortic valve regurgitation  is not visualized. Mild to moderate aortic valve stenosis.  13. The pulmonic valve was not well visualized. Pulmonic valve  regurgitation is not visualized.  14. The inferior vena cava is dilated in size with <50% respiratory  variability, suggesting right atrial pressure of 15 mmHg.  15. Technically difficult; definity used; low normal LV systolic function;  mild LVH; mild LVE; calcified aortic valve with mild to moderate AS (mean  gradient 19 mmHg); mild LAE/RAE/RVE.    ASSESSMENT AND PLAN:  1.  Persistent atrial fibrillation: Currently on  amiodarone, Eliquis, metoprolol.  CHA2DS2-VASc of 2.  He is fortunately remained in sinus rhythm since the beginning of March after his cardioversion.  I discussed with both him and his daughter at length about further options to treat his atrial fibrillation including ablation versus continuation of amiodarone.  Risks and benefits of ablation were discussed which include bleeding, tamponade, heart block, stroke, damage to chest organs among others.  He understands these risks and is agreed to the procedure.  We would be able to get him off of his amiodarone after ablation.  His blood pressure is low in clinic today and has been low in the past.  We Labrenda Lasky decrease his Toprol-XL to 50 mg.  2.  Hyperlipidemia: Continue atorvastatin  Case discussed with referring cardiologist  Current medicines are reviewed at length with the patient today.   The patient does not have concerns regarding his medicines.  The following changes were made today: Decrease Toprol-XL  Labs/ tests ordered today include:  No orders of the defined types were placed in this encounter.    Disposition:   FU with Aviendha Azbell 3 months  Signed, Madelyne Millikan Jorja Loa, MD  11/09/2019 12:36 PM     [facility] [address] [place] [address] [phone] (office) [phone] (fax)

## 2019-11-09 NOTE — Patient Instructions (Addendum)
Medication Instructions:  Your physician has recommended you make the following change in your medication:  1. DECREASE Toprol to 50 mg once daily  *If you need a refill on your cardiac medications before your next appointment, please call your pharmacy*   Lab Work: Pre procedure blood work on 4/28 when you see Dr. Cristal Deer. If you have labs (blood work) drawn today and your tests are completely normal, you will receive your results only by: Marland Kitchen MyChart Message (if you have MyChart) OR . A paper copy in the mail If you have any lab test that is abnormal or we need to change your treatment, we will call you to review the results.   Testing/Procedures: Your physician has requested that you have cardiac CT within 7 days prior to your ablation. Cardiac computed tomography (CT) is a painless test that uses an x-ray machine to take clear, detailed pictures of your heart. For further information please visit [email or web address] Please follow instruction sheet as given.   Your physician has recommended that you have an ablation. Catheter ablation is a medical procedure used to treat some cardiac arrhythmias (irregular heartbeats). During catheter ablation, a long, thin, flexible tube is put into a blood vessel in your groin (upper thigh), or neck. This tube is called an ablation catheter. It is then guided to your heart through the blood vessel. Radio frequency waves destroy small areas of heart tissue where abnormal heartbeats may cause an arrhythmia to start. Please see the instructions below.   Follow-Up: At [facility], you and your health needs are our priority.  As part of our continuing mission to provide you with exceptional heart care, we have created designated Provider Care Teams.  These Care Teams include your primary Cardiologist (physician) and Advanced Practice Providers (APPs -  Physician Assistants and Nurse Practitioners) who all work together to provide you with the care you  need, when you need it.  Your next appointment:    12/21/2019 @ 2:30 pm  The format for your next appointment:   Virtual Visit    (phone call)  Provider:   Loman Brooklyn, MD   Thank you for choosing [facility]!!   Dory Horn, RN [phone]    Other Instructions  CT INSTRUCTIONS Your cardiac CT will be scheduled at:  [facility] [address] [place], [place] [address] [phone]  Please arrive at the [facility] main entrance of [facility] at ___________ on ___________, please 30 minutes prior to test start time. Proceed to the [facility] Radiology Department (first floor) to check-in and test prep.  Please follow these instructions carefully (unless otherwise directed):  Hold all erectile dysfunction medications at least 3 days (72 hrs) prior to test.  On the Night Before the Test: . Be sure to Drink plenty of water. . Do not consume any caffeinated/decaffeinated beverages or chocolate 12 hours prior to your test. . Do not take any antihistamines 12 hours prior to your test. . If you take Metformin do not take 24 hours prior to test.  On the Day of the Test: . Drink plenty of water. Do not drink any water within one hour of the test. . Do not eat any food 4 hours prior to the test. . You may take your regular medications prior to the test.  . Take your Toprol the morning of this testing. Marland Kitchen HOLD Furosemide/Hydrochlorothiazide morning of the test.       After the Test: . Drink plenty of  water. . After receiving IV contrast, you may experience a mild flushed feeling. This is normal. . On occasion, you may experience a mild rash up to 24 hours after the test. This is not dangerous. If this occurs, you can take Benadryl 25 mg and increase your fluid intake. . If you experience trouble breathing, this can be serious. If it is severe call 911 IMMEDIATELY. If it is mild, please call our office. . If you take any of these medications:  Glipizide/Metformin, Avandament, Glucavance, please do not take 48 hours after completing test unless otherwise instructed.   Once we have confirmed authorization from your insurance company, we will call you to set up a date and time for your test.   For non-scheduling related questions, please contact the cardiac imaging nurse navigator should you have any questions/concerns: Marchia Bond, RN Navigator Cardiac Imaging [facility] [phone] office  For scheduling needs, including cancellations and rescheduling, please call [phone].       Electrophysiology/Ablation Procedure Instructions   You are scheduled for a(n)  ablation on 01/14/2020 with Dr. Allegra Lai.   1.   Pre procedure testing-             A.  LAB WORK --- On 12/15/2019 for your pre procedure blood work.                 B. COVID TEST-- On 01/11/2020 @ 2:00 pm - You will go to [facility] ([place]) for your Covid testing.   This is a drive thru test site.  There will be multiple testing areas.  Be sure to share with the first checkpoint that you are there for pre-procedure/surgery testing. This will put you into the right (yellow) lane that leads to the PAT testing team. Stay in your car and the nurse team will come to your car to test you.  After you are tested please go home and self quarantine until the day of your procedure.     2. On the day of your procedure 01/14/2020 you will go to [facility] [phone]) at 5:30 am.  Dennis Bast will go to the main entrance A [employer]) and enter where the [employer] are.  Your driver will drop you off and you will head down the hallway to ADMITTING.  You may have one support person come in to the hospital with you.  They will be asked to wait in the waiting room.   3.   Do not eat or drink after midnight prior to your procedure.   4.   Do NOT take any medications the morning of your procedure.   5.   Plan for an overnight stay.  If you use your phone frequently bring your phone charger.   6. You will follow up with the AFIB clinic 4 weeks after your procedure.  You will follow up with Dr. Curt Bears  3 months after your procedure.  These appointments will be made for you.   * If you have ANY questions please call the office [phone] and ask for Valita Righter RN or send me a MyChart message   * Occasionally, EP Studies and ablations can become lengthy.  Please make your family aware of this before your procedure starts.  Average time ranges from 2-8 hours for EP studies/ablations.  Your physician will call your family after the procedure with the results.  Cardiac Ablation Cardiac ablation is a procedure to disable (ablate) a small amount of heart tissue in very specific places. The heart has many electrical connections. Sometimes these connections are abnormal and can cause the heart to beat very fast or irregularly. Ablating some of the problem areas can improve the heart rhythm or return it to normal. Ablation may be done for people who:  Have Wolff-Parkinson-White syndrome.  Have fast heart rhythms (tachycardia).  Have taken medicines for an abnormal heart rhythm (arrhythmia) that were not effective or caused side effects.  Have a high-risk heartbeat that may be life-threatening. During the procedure, a small incision is made in the neck or the groin, and a long, thin, flexible tube (catheter) is inserted into the incision and moved to the heart. Small devices (electrodes) on the tip of the catheter will send out electrical currents. A type of X-ray (fluoroscopy) will be used to help guide the catheter and to provide images of the heart. Tell a health care provider about:  Any allergies you have.  All medicines you are taking, including vitamins, herbs, eye drops, creams, and over-the-counter medicines.  Any problems you or family members have had with  anesthetic medicines.  Any blood disorders you have.  Any surgeries you have had.  Any medical conditions you have, such as kidney failure.  Whether you are pregnant or may be pregnant. What are the risks? Generally, this is a safe procedure. However, problems may occur, including:  Infection.  Bruising and bleeding at the catheter insertion site.  Bleeding into the chest, especially into the sac that surrounds the heart. This is a serious complication.  Stroke or blood clots.  Damage to other structures or organs.  Allergic reaction to medicines or dyes.  Need for a permanent pacemaker if the normal electrical system is damaged. A pacemaker is a small computer that sends electrical signals to the heart and helps your heart beat normally.  The procedure not being fully effective. This may not be recognized until months later. Repeat ablation procedures are sometimes required. What happens before the procedure?  Follow instructions from your health care provider about eating or drinking restrictions.  Ask your health care provider about: ? Changing or stopping your regular medicines. This is especially important if you are taking diabetes medicines or blood thinners. ? Taking medicines such as aspirin and ibuprofen. These medicines can thin your blood. Do not take these medicines before your procedure if your health care provider instructs you not to.  Plan to have someone take you home from the hospital or clinic.  If you will be going home right after the procedure, plan to have someone with you for 24 hours. What happens during the procedure?  To lower your risk of infection: ? Your health care team will wash or sanitize their hands. ? Your skin will be washed with soap. ? Hair may be removed from the incision area.  An IV tube will be inserted into one of your veins.  You will be given a medicine to help you relax (sedative).  The skin on your neck or groin will be  numbed.  An incision will be made in your neck or your groin.  A needle will be inserted through the incision and into a large vein in your neck or groin.  A catheter will be inserted into the needle and moved to your heart.  Dye may be injected through the catheter to help your surgeon see the area of the  heart that needs treatment.  Electrical currents will be sent from the catheter to ablate heart tissue in desired areas. There are three types of energy that may be used to ablate heart tissue: ? Heat (radiofrequency energy). ? Laser energy. ? Extreme cold (cryoablation).  When the necessary tissue has been ablated, the catheter will be removed.  Pressure will be held on the catheter insertion area to prevent excessive bleeding.  A bandage (dressing) will be placed over the catheter insertion area. The procedure may vary among health care providers and hospitals. What happens after the procedure?  Your blood pressure, heart rate, breathing rate, and blood oxygen level will be monitored until the medicines you were given have worn off.  Your catheter insertion area will be monitored for bleeding. You will need to lie still for a few hours to ensure that you do not bleed from the catheter insertion area.  Do not drive for 24 hours or as long as directed by your health care provider. Summary  Cardiac ablation is a procedure to disable (ablate) a small amount of heart tissue in very specific places. Ablating some of the problem areas can improve the heart rhythm or return it to normal.  During the procedure, electrical currents will be sent from the catheter to ablate heart tissue in desired areas. This information is not intended to replace advice given to you by your health care provider. Make sure you discuss any questions you have with your health care provider. Document Revised: 01/26/2018 Document Reviewed: 06/24/2016 Elsevier Patient Education  2020 [employer].

## 2019-11-10 NOTE — Telephone Encounter (Signed)
Left message to call back  

## 2019-11-10 NOTE — Telephone Encounter (Signed)
Daughter updated and verbalized understanding.  

## 2019-11-10 NOTE — Telephone Encounter (Signed)
I was sent Dr. Elberta Fortis' note. He kept the amiodarone, which is the medication designed to keep him out of afib. The metoprolol that was changed was to prevent his heart from going too fast when he is in afib, but as long as he is in sinus it is fine. If they notice very fast heart rates (>110 bpm) sitting still I would call the office as he may be in afib. I would follow Dr. Elberta Fortis' instructions. Thanks.

## 2019-11-11 DIAGNOSIS — I4819 Other persistent atrial fibrillation: Secondary | ICD-10-CM | POA: Diagnosis not present

## 2019-11-11 DIAGNOSIS — E7849 Other hyperlipidemia: Secondary | ICD-10-CM | POA: Diagnosis not present

## 2019-11-11 DIAGNOSIS — J45909 Unspecified asthma, uncomplicated: Secondary | ICD-10-CM | POA: Diagnosis not present

## 2019-11-11 DIAGNOSIS — D6869 Other thrombophilia: Secondary | ICD-10-CM | POA: Diagnosis not present

## 2019-11-11 DIAGNOSIS — I10 Essential (primary) hypertension: Secondary | ICD-10-CM | POA: Diagnosis not present

## 2019-11-11 DIAGNOSIS — E119 Type 2 diabetes mellitus without complications: Secondary | ICD-10-CM | POA: Diagnosis not present

## 2019-11-12 ENCOUNTER — Telehealth: Payer: Self-pay | Admitting: *Deleted

## 2019-11-12 NOTE — Telephone Encounter (Signed)
-----   Message from Gaynelle Cage, CMA sent at 11/09/2019  9:14 AM EDT ----- Regarding: RE: sleep study Ok to schedule sleep study. No PA is required. ----- Message ----- From: Shona Simpson, RN Sent: 10/25/2019   2:13 PM EDT To: [facility] Subject: sleep study                                    Pt needs sleep study = he missed his last study due to rapid afib and needing to be cardioverted. Thank you Kennyth Arnold

## 2019-11-12 NOTE — Telephone Encounter (Signed)
Scheduled by Claiborne Rigg.

## 2019-11-16 DIAGNOSIS — I10 Essential (primary) hypertension: Secondary | ICD-10-CM | POA: Diagnosis not present

## 2019-11-16 DIAGNOSIS — D6869 Other thrombophilia: Secondary | ICD-10-CM | POA: Diagnosis not present

## 2019-11-16 DIAGNOSIS — J45909 Unspecified asthma, uncomplicated: Secondary | ICD-10-CM | POA: Diagnosis not present

## 2019-11-16 DIAGNOSIS — E7849 Other hyperlipidemia: Secondary | ICD-10-CM | POA: Diagnosis not present

## 2019-11-16 DIAGNOSIS — I4819 Other persistent atrial fibrillation: Secondary | ICD-10-CM | POA: Diagnosis not present

## 2019-11-16 DIAGNOSIS — E119 Type 2 diabetes mellitus without complications: Secondary | ICD-10-CM | POA: Diagnosis not present

## 2019-11-18 DIAGNOSIS — I4819 Other persistent atrial fibrillation: Secondary | ICD-10-CM | POA: Diagnosis not present

## 2019-11-18 DIAGNOSIS — I10 Essential (primary) hypertension: Secondary | ICD-10-CM | POA: Diagnosis not present

## 2019-11-18 DIAGNOSIS — D6869 Other thrombophilia: Secondary | ICD-10-CM | POA: Diagnosis not present

## 2019-11-18 DIAGNOSIS — E119 Type 2 diabetes mellitus without complications: Secondary | ICD-10-CM | POA: Diagnosis not present

## 2019-11-19 ENCOUNTER — Ambulatory Visit (INDEPENDENT_AMBULATORY_CARE_PROVIDER_SITE_OTHER): Payer: Medicare Other | Admitting: Physician Assistant

## 2019-11-19 ENCOUNTER — Encounter: Payer: Self-pay | Admitting: Physician Assistant

## 2019-11-19 ENCOUNTER — Other Ambulatory Visit: Payer: Self-pay

## 2019-11-19 VITALS — BP 116/74 | HR 70 | Ht 75.0 in | Wt 278.0 lb

## 2019-11-19 DIAGNOSIS — I10 Essential (primary) hypertension: Secondary | ICD-10-CM

## 2019-11-19 DIAGNOSIS — E119 Type 2 diabetes mellitus without complications: Secondary | ICD-10-CM

## 2019-11-19 DIAGNOSIS — I4819 Other persistent atrial fibrillation: Secondary | ICD-10-CM

## 2019-11-19 DIAGNOSIS — I350 Nonrheumatic aortic (valve) stenosis: Secondary | ICD-10-CM

## 2019-11-19 DIAGNOSIS — N179 Acute kidney failure, unspecified: Secondary | ICD-10-CM | POA: Diagnosis not present

## 2019-11-19 DIAGNOSIS — E785 Hyperlipidemia, unspecified: Secondary | ICD-10-CM | POA: Diagnosis not present

## 2019-11-19 NOTE — Progress Notes (Signed)
Cardiology Office Note:    Date:  11/21/2019   ID:  Brandon Sanchez, DOB 12-16-1945, MRN [id number]  PCP:  Merri Brunette, MD  Cardiologist:  Jodelle Red, MD  Electrophysiologist:  None   Referring MD: Merri Brunette, MD   Chief Complaint  Patient presents with  . Follow-up    seen for Dr. Cristal Deer    History of Present Illness:    Brandon Sanchez is a 74 y.o. male with a hx of asthma, DM 2, hyperlipidemia, hypertension, and persistent atrial fibrillation.  Patient was initially diagnosed with atrial fibrillation in January 2021 after presenting with symptom of fatigue and shortness of breath.  He was diagnosed with atrial fibrillation with RVR in the setting of sepsis and pneumonia.  He was started on Eliquis and underwent TEE/DCCV on 10/09/2019.  Echocardiogram obtained during the hospitalization showed EF 50 to 55%, mild LAE.  When he returned for hospital follow-up, he was back in atrial fibrillation with RVR.  He underwent repeat DCCV on 10/18/2019 on amiodarone.  More recently he was seen by Dr. Cristal Deer on 11/02/2019, blood pressure was low on that day at 84/56.  Metoprolol succinate was cut back from the previous 200 mg daily to 100 mg daily.  He was on 80 mg daily of Lasix with additional 40 mg in the afternoon on an as-needed basis.  He was most recently seen by Dr. Elberta Fortis on 11/08/2019, metoprolol succinate was further down titrated to 50 mg daily.  Dr. Elberta Fortis has discussed with the patient regarding ablation and he was agreeable.  Patient presents today for cardiology office visit.  His systolic blood pressure has been in the 110s range.  Heart rate has been in the in the upper 50 to 70s range.  Overall he has been feeling well without any palpitation.  Based on physical exam he is still in sinus rhythm.  He does have a systolic heart murmur consistent with mild to moderate aortic stenosis.  I recommended continue on the current therapy.  I will obtain a basic metabolic  panel since the last lab work shows worsening renal function.  I expect his renal function to improve by this point.  I will defer additional work-up including the atrial fibrillation ablation procedure to Dr. Elberta Fortis.  Past Medical History:  Diagnosis Date  . Allergic rhinitis, cause unspecified   . Asthma   . Other and unspecified hyperlipidemia   . Type II or unspecified type diabetes mellitus without mention of complication, not stated as uncontrolled   . Unspecified essential hypertension     Past Surgical History:  Procedure Laterality Date  . CARDIOVERSION N/A 09/22/2019   Procedure: CARDIOVERSION;  Surgeon: [name], MD;  Location: [facility] ENDOSCOPY;  Service: Endoscopy;  Laterality: N/A;  . CARDIOVERSION N/A 10/18/2019   Procedure: CARDIOVERSION;  Surgeon: Jake Bathe, MD;  Location: [facility] ENDOSCOPY;  Service: Cardiovascular;  Laterality: N/A;  . TEE WITHOUT CARDIOVERSION N/A 09/22/2019   Procedure: TRANSESOPHAGEAL ECHOCARDIOGRAM (TEE);  Surgeon: [name], MD;  Location: [facility] ENDOSCOPY;  Service: Endoscopy;  Laterality: N/A;    Current Medications: Current Meds  Medication Sig  . acetaminophen (TYLENOL) 500 MG tablet Take 1,000 mg by mouth every 6 (six) hours as needed for moderate pain or headache.   . albuterol (PROAIR HFA) 108 (90 BASE) MCG/ACT inhaler 2 puffs every 4 hours as needed only  if your can't catch your breath (Patient taking differently: Inhale 2 puffs into the lungs every 4 (four) hours as  needed for wheezing. )  . amiodarone (PACERONE) 200 MG tablet Take 1 tablet (200 mg total) by mouth daily.  Marland Kitchen apixaban (ELIQUIS) 5 MG TABS tablet Take 1 tablet (5 mg total) by mouth 2 (two) times daily.  Marland Kitchen atorvastatin (LIPITOR) 40 MG tablet Take 40 mg by mouth every evening.   Marland Kitchen BREO ELLIPTA 200-25 MCG/INH AEPB Inhale 1 puff into the lungs daily as needed (shortness of breath).   . furosemide (LASIX) 40 MG tablet Take 2 tablets (80 mg total) by mouth daily.  May take additional 40 mg if notice swelling of shortness of breath  . Glucosamine-Chondroit-Vit C-Mn (GLUCOSAMINE CHONDR 1500 COMPLX) CAPS Take 1 capsule by mouth 2 (two) times daily.  . insulin aspart protamine- aspart (NOVOLOG MIX 70/30) (70-30) 100 UNIT/ML injection Inject 30 Units into the skin 2 (two) times daily at 8 am and 10 pm.   . metoprolol succinate (TOPROL-XL) 50 MG 24 hr tablet Take 1 tablet (50 mg total) by mouth daily. Take with or immediately following a meal.  . Multiple Vitamin (MULTIVITAMIN WITH MINERALS) TABS tablet Take 1 tablet by mouth daily.  Marland Kitchen omeprazole (PRILOSEC) 40 MG capsule Take 40 mg by mouth daily as needed (heart burn).   Letta Pate VERIO test strip USE AS DIRECTED TO TEST BLOOD SUGAR THREE TIMES DAILY FOR 30 DAYS  . potassium chloride (KLOR-CON) 10 MEQ tablet Take 10 mEq by mouth at bedtime.  Marland Kitchen PREVIDENT 5000 BOOSTER PLUS 1.1 % PSTE Place 1 application onto teeth in the morning and at bedtime.   . sertraline (ZOLOFT) 100 MG tablet Take 100 mg by mouth daily.     Allergies:   Patient has no known allergies.   Social History   Socioeconomic History  . Marital status: Divorced    Spouse name: Not on file  . Number of children: Not on file  . Years of education: Not on file  . Highest education level: Not on file  Occupational History  . Occupation: Armed forces logistics/support/administrative officer  Tobacco Use  . Smoking status: Never Smoker  . Smokeless tobacco: Never Used  Substance and Sexual Activity  . Alcohol use: Not Currently    Alcohol/week: 3.0 standard drinks    Types: 3 Glasses of wine per week  . Drug use: Never  . Sexual activity: Not on file  Other Topics Concern  . Not on file  Social History Narrative  . Not on file   Social Determinants of Health   Financial Resource Strain:   . Difficulty of Paying Living Expenses:   Food Insecurity: No Food Insecurity  . Worried About Programme researcher, broadcasting/film/video in the Last Year: Never true  . Ran Out of Food in the Last Year:  Never true  Transportation Needs: No Transportation Needs  . Lack of Transportation (Medical): No  . Lack of Transportation (Non-Medical): No  Physical Activity:   . Days of Exercise per Week:   . Minutes of Exercise per Session:   Stress:   . Feeling of Stress :   Social Connections:   . Frequency of Communication with Friends and Family:   . Frequency of Social Gatherings with Friends and Family:   . Attends Religious Services:   . Active Member of Clubs or Organizations:   . Attends Banker Meetings:   Marland Kitchen Marital Status:      Family History: The patient's family history includes Colon cancer in his father; Heart disease in his paternal grandfather.  ROS:   Please  see the history of present illness.     All other systems reviewed and are negative.  EKGs/Labs/Other Studies Reviewed:    The following studies were reviewed today:  Echo 09/20/2019 1. Left ventricular ejection fraction, by visual estimation, is 50 to  55%. The left ventricle has normal function. There is mildly increased  left ventricular hypertrophy.  2. Definity contrast agent was given IV to delineate the left ventricular  endocardial borders.  3. Left ventricular diastolic function could not be evaluated.  4. Mildly dilated left ventricular internal cavity size.  5. The left ventricle has no regional wall motion abnormalities.  6. Global right ventricle has normal systolic function.The right  ventricular size is mildly enlarged.  7. Left atrial size was mildly dilated.  8. Right atrial size was mildly dilated.  9. Mild mitral annular calcification.  10. Trivial mitral valve regurgitation. No evidence of mitral stenosis.  11. The tricuspid valve is normal in structure. Tricuspid valve  regurgitation is trivial.  12. The aortic valve was not well visualized. Aortic valve regurgitation  is not visualized. Mild to moderate aortic valve stenosis.  13. The pulmonic valve was not well  visualized. Pulmonic valve  regurgitation is not visualized.  14. The inferior vena cava is dilated in size with <50% respiratory  variability, suggesting right atrial pressure of 15 mmHg.  15. Technically difficult; definity used; low normal LV systolic function;  mild LVH; mild LVE; calcified aortic valve with mild to moderate AS (mean  gradient 19 mmHg); mild LAE/RAE/RVE.   EKG:  EKG is not ordered today.    Recent Labs: 09/20/2019: B Natriuretic Peptide 289.1 09/22/2019: Magnesium 2.3 09/28/2019: ALT 33; TSH 2.357 10/12/2019: Hemoglobin 13.6; Platelets 311 11/19/2019: BUN 24; Creatinine, Ser 1.44; Potassium 4.0; Sodium 143  Recent Lipid Panel No results found for: CHOL, TRIG, HDL, CHOLHDL, VLDL, LDLCALC, LDLDIRECT  Physical Exam:    VS:  BP 116/74   Pulse 70   Ht 6\' 3"  (1.905 m)   Wt 278 lb (126.1 kg)   SpO2 96%   BMI 34.75 kg/m     Wt Readings from Last 3 Encounters:  11/19/19 278 lb (126.1 kg)  11/09/19 280 lb 6.4 oz (127.2 kg)  11/02/19 282 lb 3.2 oz (128 kg)     GEN:  Well nourished, well developed in no acute distress HEENT: Normal NECK: No JVD; No carotid bruits LYMPHATICS: No lymphadenopathy CARDIAC: RRR, no rubs, gallops  2/6 murmur at RUSB RESPIRATORY:  Clear to auscultation without rales, wheezing or rhonchi  ABDOMEN: Soft, non-tender, non-distended MUSCULOSKELETAL:  No edema; No deformity  SKIN: Warm and dry NEUROLOGIC:  Alert and oriented x 3 PSYCHIATRIC:  Normal affect   ASSESSMENT:    1. Persistent atrial fibrillation (HCC)   2. AKI (acute kidney injury) (HCC)   3. Essential hypertension   4. Hyperlipidemia LDL goal <70   5. Controlled type 2 diabetes mellitus without complication, without long-term current use of insulin (HCC)   6. Nonrheumatic aortic valve stenosis    PLAN:    In order of problems listed above:  1. Persistent atrial fibrillation: Maintaining sinus rhythm based on physical exam.  On amiodarone therapy, Eliquis and lower dose of  metoprolol due to recent hypotension.  Dr. 11/04/19 initially cut the metoprolol down to 100 mg, Dr. Cristal Deer subsequently cut it down to 50 mg.  The current plan is for him to follow-up with Dr. Elberta Fortis and proceed with atrial fibrillation ablation.  2. AKI: Obtain basic metabolic panel  3. Hypertension: Blood pressure stable  4. Hyperlipidemia: On Lipitor  5. DM2: Managed by primary care provider  6. Aortic stenosis: Mild to moderate aortic stenosis seen on recent echocardiogram   Medication Adjustments/Labs and Tests Ordered: Current medicines are reviewed at length with the patient today.  Concerns regarding medicines are outlined above.  Orders Placed This Encounter  Procedures  . Basic metabolic panel   No orders of the defined types were placed in this encounter.   Patient Instructions  Medication Instructions:  Your physician recommends that you continue on your current medications as directed. Please refer to the Current Medication list given to you today.  *If you need a refill on your cardiac medications before your next appointment, please call your pharmacy*  Lab Work: Your physician recommends that you return for lab work in [place]:   BMET   If you have labs (blood work) drawn today and your tests are completely normal, you will receive your results only by: Marland Kitchen MyChart Message (if you have MyChart) OR . A paper copy in the mail If you have any lab test that is abnormal or we need to change your treatment, we will call you to review the results.   Testing/Procedures: NONE ordered at this time of appointment   Follow-Up: At [facility], you and your health needs are our priority.  As part of our continuing mission to provide you with exceptional heart care, we have created designated Provider Care Teams.  These Care Teams include your primary Cardiologist (physician) and Advanced Practice Providers (APPs -  Physician Assistants and Nurse Practitioners) who  all work together to provide you with the care you need, when you need it.  We recommend signing up for the patient portal called "MyChart".  Sign up information is provided on this After Visit Summary.  MyChart is used to connect with patients for Virtual Visits (Telemedicine).  Patients are able to view lab/test results, encounter notes, upcoming appointments, etc.  Non-urgent messages can be sent to your provider as well.   To learn more about what you can do with MyChart, go to [email or web address].    Your next appointment:   Follow up as scheduled   The format for your next appointment:   In Person  Provider:    Other Instructions      Signed, Almyra Deforest, [place]  11/21/2019 11:08 PM    [place]

## 2019-11-19 NOTE — Patient Instructions (Addendum)
Medication Instructions:  Your physician recommends that you continue on your current medications as directed. Please refer to the Current Medication list given to you today.  *If you need a refill on your cardiac medications before your next appointment, please call your pharmacy*  Lab Work: Your physician recommends that you return for lab work in TODAY:   BMET   If you have labs (blood work) drawn today and your tests are completely normal, you will receive your results only by: Marland Kitchen MyChart Message (if you have MyChart) OR . A paper copy in the mail If you have any lab test that is abnormal or we need to change your treatment, we will call you to review the results.   Testing/Procedures: NONE ordered at this time of appointment   Follow-Up: At [facility], you and your health needs are our priority.  As part of our continuing mission to provide you with exceptional heart care, we have created designated Provider Care Teams.  These Care Teams include your primary Cardiologist (physician) and Advanced Practice Providers (APPs -  Physician Assistants and Nurse Practitioners) who all work together to provide you with the care you need, when you need it.  We recommend signing up for the patient portal called "MyChart".  Sign up information is provided on this After Visit Summary.  MyChart is used to connect with patients for Virtual Visits (Telemedicine).  Patients are able to view lab/test results, encounter notes, upcoming appointments, etc.  Non-urgent messages can be sent to your provider as well.   To learn more about what you can do with MyChart, go to [email or web address].    Your next appointment:   Follow up as scheduled   The format for your next appointment:   In Person  Provider:    Other Instructions

## 2019-11-20 LAB — BASIC METABOLIC PANEL
BUN/Creatinine Ratio: 17 (ref 10–24)
BUN: 24 mg/dL (ref 8–27)
CO2: 27 mmol/L (ref 20–29)
Calcium: 9.6 mg/dL (ref 8.6–10.2)
Chloride: 96 mmol/L (ref 96–106)
Creatinine, Ser: 1.44 mg/dL — ABNORMAL HIGH (ref 0.76–1.27)
GFR calc Af Amer: 56 mL/min/{1.73_m2} — ABNORMAL LOW (ref >59)
GFR calc non Af Amer: 48 mL/min/{1.73_m2} — ABNORMAL LOW (ref >59)
Glucose: 116 mg/dL — ABNORMAL HIGH (ref 65–99)
Potassium: 4 mmol/L (ref 3.5–5.2)
Sodium: 143 mmol/L (ref 134–144)

## 2019-11-21 ENCOUNTER — Encounter: Payer: Self-pay | Admitting: Physician Assistant

## 2019-11-22 ENCOUNTER — Other Ambulatory Visit (HOSPITAL_COMMUNITY)
Admission: RE | Admit: 2019-11-22 | Discharge: 2019-11-22 | Disposition: A | Discharge status: Home / Self Care | Payer: Medicare Other | Source: Ambulatory Visit | Attending: Cardiovascular Disease | Admitting: Cardiovascular Disease

## 2019-11-22 DIAGNOSIS — E1129 Type 2 diabetes mellitus with other diabetic kidney complication: Secondary | ICD-10-CM | POA: Diagnosis not present

## 2019-11-22 DIAGNOSIS — Z20822 Contact with and (suspected) exposure to covid-19: Secondary | ICD-10-CM | POA: Insufficient documentation

## 2019-11-22 DIAGNOSIS — Z01812 Encounter for preprocedural laboratory examination: Secondary | ICD-10-CM | POA: Insufficient documentation

## 2019-11-22 DIAGNOSIS — I10 Essential (primary) hypertension: Secondary | ICD-10-CM | POA: Diagnosis not present

## 2019-11-22 LAB — SARS CORONAVIRUS 2 (TAT 6-24 HRS): SARS Coronavirus 2: NEGATIVE

## 2019-11-24 ENCOUNTER — Other Ambulatory Visit: Payer: Self-pay

## 2019-11-24 ENCOUNTER — Telehealth: Payer: Self-pay | Admitting: *Deleted

## 2019-11-24 ENCOUNTER — Ambulatory Visit (HOSPITAL_BASED_OUTPATIENT_CLINIC_OR_DEPARTMENT_OTHER): Payer: Medicare Other | Attending: Physician Assistant | Admitting: Cardiovascular Disease

## 2019-11-24 DIAGNOSIS — E669 Obesity, unspecified: Secondary | ICD-10-CM | POA: Diagnosis not present

## 2019-11-24 DIAGNOSIS — I10 Essential (primary) hypertension: Secondary | ICD-10-CM | POA: Diagnosis not present

## 2019-11-24 DIAGNOSIS — G4730 Sleep apnea, unspecified: Secondary | ICD-10-CM | POA: Insufficient documentation

## 2019-11-24 DIAGNOSIS — Z79899 Other long term (current) drug therapy: Secondary | ICD-10-CM | POA: Insufficient documentation

## 2019-11-24 DIAGNOSIS — G4761 Periodic limb movement disorder: Secondary | ICD-10-CM | POA: Insufficient documentation

## 2019-11-24 DIAGNOSIS — R0902 Hypoxemia: Secondary | ICD-10-CM | POA: Insufficient documentation

## 2019-11-24 DIAGNOSIS — Z7951 Long term (current) use of inhaled steroids: Secondary | ICD-10-CM | POA: Diagnosis not present

## 2019-11-24 DIAGNOSIS — Z7901 Long term (current) use of anticoagulants: Secondary | ICD-10-CM | POA: Diagnosis not present

## 2019-11-24 DIAGNOSIS — I4891 Unspecified atrial fibrillation: Secondary | ICD-10-CM | POA: Diagnosis not present

## 2019-11-24 DIAGNOSIS — Z794 Long term (current) use of insulin: Secondary | ICD-10-CM | POA: Diagnosis not present

## 2019-11-24 DIAGNOSIS — E119 Type 2 diabetes mellitus without complications: Secondary | ICD-10-CM | POA: Diagnosis not present

## 2019-11-24 DIAGNOSIS — Z6835 Body mass index (BMI) 35.0-35.9, adult: Secondary | ICD-10-CM | POA: Insufficient documentation

## 2019-11-24 NOTE — Telephone Encounter (Signed)
   [facility] Pre-operative Risk Assessment    Request for surgical clearance: SEE ORIGINAL CLEARANCE NOTE THAT WAS PUT IN 09/29/19  1. What type of surgery is being performed? CATARACT EXTRACTION w/INTRAOCULAR LENS IMPLANT OF THE RIGHT EYE FOLLOWED BY THE LEFT EYE   2. When is this surgery scheduled? TBD   3. What type of clearance is required (medical clearance vs. Pharmacy clearance to hold med vs. Both)? MEDICAL  4. Are there any medications that need to be held prior to surgery and how long? PER DR. BEVIS NO MEDICATIONS NEED TO BE HELD INCLUDING ANY  BLOOD THINNERS   5. Practice name and name of physician performing surgery? [facility]; DR. Talbert Forest   6. What is your office phone number [phone]    7.   What is your office fax number [phone]  8.   Anesthesia type (None, local, MAC, general) ? TOPICAL ANESTHESIA w/IV MEDICATION   Julaine Hua 11/24/2019, 1:06 PM  _________________________________________________________________   (provider comments below)

## 2019-11-24 NOTE — Telephone Encounter (Signed)
   Primary Cardiologist: Jodelle Red, MD  Chart reviewed as part of pre-operative protocol coverage. Cataract extractions are recognized in guidelines as low risk surgeries that do not typically require specific preoperative testing or holding of blood thinner therapy.   Pt underwent cardioversion and is maintaining normal sinus rhythm, per EKG 10/25/19.  Therefore, given past medical history and time since last visit, based on ACC/AHA guidelines, Brandon Sanchez would be at acceptable risk for the planned procedure without further cardiovascular testing.   I will route this recommendation to the requesting party via Epic fax function and remove from pre-op pool.  Please call with questions.  Roe Rutherford Ceasar Decandia, PA 11/24/2019, 2:49 PM

## 2019-11-25 NOTE — Progress Notes (Signed)
Stable renal function and electrolyte. The previous low potassium resolved

## 2019-11-26 DIAGNOSIS — D6869 Other thrombophilia: Secondary | ICD-10-CM | POA: Diagnosis not present

## 2019-11-26 DIAGNOSIS — I10 Essential (primary) hypertension: Secondary | ICD-10-CM | POA: Diagnosis not present

## 2019-11-26 DIAGNOSIS — I4819 Other persistent atrial fibrillation: Secondary | ICD-10-CM | POA: Diagnosis not present

## 2019-11-26 DIAGNOSIS — E119 Type 2 diabetes mellitus without complications: Secondary | ICD-10-CM | POA: Diagnosis not present

## 2019-11-26 DIAGNOSIS — J45909 Unspecified asthma, uncomplicated: Secondary | ICD-10-CM | POA: Diagnosis not present

## 2019-11-26 DIAGNOSIS — E7849 Other hyperlipidemia: Secondary | ICD-10-CM | POA: Diagnosis not present

## 2019-12-01 DIAGNOSIS — J45909 Unspecified asthma, uncomplicated: Secondary | ICD-10-CM | POA: Diagnosis not present

## 2019-12-01 DIAGNOSIS — E7849 Other hyperlipidemia: Secondary | ICD-10-CM | POA: Diagnosis not present

## 2019-12-01 DIAGNOSIS — I10 Essential (primary) hypertension: Secondary | ICD-10-CM | POA: Diagnosis not present

## 2019-12-01 DIAGNOSIS — D6869 Other thrombophilia: Secondary | ICD-10-CM | POA: Diagnosis not present

## 2019-12-01 DIAGNOSIS — I4819 Other persistent atrial fibrillation: Secondary | ICD-10-CM | POA: Diagnosis not present

## 2019-12-01 DIAGNOSIS — E119 Type 2 diabetes mellitus without complications: Secondary | ICD-10-CM | POA: Diagnosis not present

## 2019-12-03 ENCOUNTER — Encounter (HOSPITAL_COMMUNITY): Payer: Self-pay | Admitting: Emergency Medicine

## 2019-12-03 ENCOUNTER — Inpatient Hospital Stay (HOSPITAL_COMMUNITY)
Admission: EM | Admit: 2019-12-03 | Discharge: 2019-12-10 | DRG: 065 | Disposition: A | Discharge status: Rehabilitation Facility | Payer: Medicare Other | Attending: Internal Medicine | Admitting: Internal Medicine

## 2019-12-03 ENCOUNTER — Emergency Department (HOSPITAL_COMMUNITY): Payer: Medicare Other

## 2019-12-03 ENCOUNTER — Other Ambulatory Visit: Payer: Self-pay

## 2019-12-03 DIAGNOSIS — Z6834 Body mass index (BMI) 34.0-34.9, adult: Secondary | ICD-10-CM

## 2019-12-03 DIAGNOSIS — R29810 Facial weakness: Secondary | ICD-10-CM | POA: Diagnosis present

## 2019-12-03 DIAGNOSIS — E1169 Type 2 diabetes mellitus with other specified complication: Secondary | ICD-10-CM | POA: Diagnosis present

## 2019-12-03 DIAGNOSIS — Z20822 Contact with and (suspected) exposure to covid-19: Secondary | ICD-10-CM | POA: Diagnosis present

## 2019-12-03 DIAGNOSIS — D631 Anemia in chronic kidney disease: Secondary | ICD-10-CM | POA: Diagnosis present

## 2019-12-03 DIAGNOSIS — R29818 Other symptoms and signs involving the nervous system: Secondary | ICD-10-CM | POA: Diagnosis not present

## 2019-12-03 DIAGNOSIS — I10 Essential (primary) hypertension: Secondary | ICD-10-CM

## 2019-12-03 DIAGNOSIS — R200 Anesthesia of skin: Secondary | ICD-10-CM

## 2019-12-03 DIAGNOSIS — K5900 Constipation, unspecified: Secondary | ICD-10-CM | POA: Diagnosis present

## 2019-12-03 DIAGNOSIS — I639 Cerebral infarction, unspecified: Secondary | ICD-10-CM

## 2019-12-03 DIAGNOSIS — I352 Nonrheumatic aortic (valve) stenosis with insufficiency: Secondary | ICD-10-CM | POA: Diagnosis present

## 2019-12-03 DIAGNOSIS — N1831 Chronic kidney disease, stage 3a: Secondary | ICD-10-CM | POA: Diagnosis present

## 2019-12-03 DIAGNOSIS — I6523 Occlusion and stenosis of bilateral carotid arteries: Secondary | ICD-10-CM | POA: Diagnosis not present

## 2019-12-03 DIAGNOSIS — E1122 Type 2 diabetes mellitus with diabetic chronic kidney disease: Secondary | ICD-10-CM | POA: Diagnosis present

## 2019-12-03 DIAGNOSIS — D72829 Elevated white blood cell count, unspecified: Secondary | ICD-10-CM | POA: Diagnosis present

## 2019-12-03 DIAGNOSIS — Z800 Family history of malignant neoplasm of digestive organs: Secondary | ICD-10-CM

## 2019-12-03 DIAGNOSIS — R4781 Slurred speech: Secondary | ICD-10-CM | POA: Diagnosis not present

## 2019-12-03 DIAGNOSIS — Z79899 Other long term (current) drug therapy: Secondary | ICD-10-CM

## 2019-12-03 DIAGNOSIS — Z8249 Family history of ischemic heart disease and other diseases of the circulatory system: Secondary | ICD-10-CM

## 2019-12-03 DIAGNOSIS — M79675 Pain in left toe(s): Secondary | ICD-10-CM

## 2019-12-03 DIAGNOSIS — J45909 Unspecified asthma, uncomplicated: Secondary | ICD-10-CM | POA: Diagnosis present

## 2019-12-03 DIAGNOSIS — R531 Weakness: Secondary | ICD-10-CM | POA: Diagnosis not present

## 2019-12-03 DIAGNOSIS — I129 Hypertensive chronic kidney disease with stage 1 through stage 4 chronic kidney disease, or unspecified chronic kidney disease: Secondary | ICD-10-CM | POA: Diagnosis present

## 2019-12-03 DIAGNOSIS — I959 Hypotension, unspecified: Secondary | ICD-10-CM | POA: Diagnosis not present

## 2019-12-03 DIAGNOSIS — F329 Major depressive disorder, single episode, unspecified: Secondary | ICD-10-CM | POA: Diagnosis present

## 2019-12-03 DIAGNOSIS — N3281 Overactive bladder: Secondary | ICD-10-CM | POA: Diagnosis present

## 2019-12-03 DIAGNOSIS — H25013 Cortical age-related cataract, bilateral: Secondary | ICD-10-CM | POA: Diagnosis present

## 2019-12-03 DIAGNOSIS — G8194 Hemiplegia, unspecified affecting left nondominant side: Secondary | ICD-10-CM | POA: Diagnosis not present

## 2019-12-03 DIAGNOSIS — I6389 Other cerebral infarction: Principal | ICD-10-CM | POA: Diagnosis present

## 2019-12-03 DIAGNOSIS — R471 Dysarthria and anarthria: Secondary | ICD-10-CM | POA: Diagnosis present

## 2019-12-03 DIAGNOSIS — R29710 NIHSS score 10: Secondary | ICD-10-CM | POA: Diagnosis not present

## 2019-12-03 DIAGNOSIS — M79674 Pain in right toe(s): Secondary | ICD-10-CM

## 2019-12-03 DIAGNOSIS — E1165 Type 2 diabetes mellitus with hyperglycemia: Secondary | ICD-10-CM | POA: Diagnosis not present

## 2019-12-03 DIAGNOSIS — H3530 Unspecified macular degeneration: Secondary | ICD-10-CM | POA: Diagnosis present

## 2019-12-03 DIAGNOSIS — I6340 Cerebral infarction due to embolism of unspecified cerebral artery: Secondary | ICD-10-CM | POA: Insufficient documentation

## 2019-12-03 DIAGNOSIS — I63131 Cerebral infarction due to embolism of right carotid artery: Secondary | ICD-10-CM

## 2019-12-03 DIAGNOSIS — I480 Paroxysmal atrial fibrillation: Secondary | ICD-10-CM | POA: Diagnosis present

## 2019-12-03 DIAGNOSIS — Z794 Long term (current) use of insulin: Secondary | ICD-10-CM

## 2019-12-03 DIAGNOSIS — K219 Gastro-esophageal reflux disease without esophagitis: Secondary | ICD-10-CM | POA: Diagnosis present

## 2019-12-03 DIAGNOSIS — Z7951 Long term (current) use of inhaled steroids: Secondary | ICD-10-CM

## 2019-12-03 DIAGNOSIS — R001 Bradycardia, unspecified: Secondary | ICD-10-CM | POA: Diagnosis not present

## 2019-12-03 DIAGNOSIS — R0902 Hypoxemia: Secondary | ICD-10-CM | POA: Diagnosis not present

## 2019-12-03 DIAGNOSIS — I509 Heart failure, unspecified: Secondary | ICD-10-CM

## 2019-12-03 DIAGNOSIS — E11649 Type 2 diabetes mellitus with hypoglycemia without coma: Secondary | ICD-10-CM

## 2019-12-03 DIAGNOSIS — K5901 Slow transit constipation: Secondary | ICD-10-CM

## 2019-12-03 DIAGNOSIS — E785 Hyperlipidemia, unspecified: Secondary | ICD-10-CM | POA: Diagnosis present

## 2019-12-03 DIAGNOSIS — R7989 Other specified abnormal findings of blood chemistry: Secondary | ICD-10-CM | POA: Diagnosis present

## 2019-12-03 DIAGNOSIS — Z7901 Long term (current) use of anticoagulants: Secondary | ICD-10-CM

## 2019-12-03 HISTORY — DX: Cortical age-related cataract, bilateral: H25.013

## 2019-12-03 HISTORY — DX: Unspecified macular degeneration: H35.30

## 2019-12-03 LAB — COMPREHENSIVE METABOLIC PANEL
ALT: 39 U/L (ref 0–44)
AST: 41 U/L (ref 15–41)
Albumin: 2.8 g/dL — ABNORMAL LOW (ref 3.5–5.0)
Alkaline Phosphatase: 78 U/L (ref 38–126)
Anion gap: 15 (ref 5–15)
BUN: 27 mg/dL — ABNORMAL HIGH (ref 8–23)
CO2: 25 mmol/L (ref 22–32)
Calcium: 9.1 mg/dL (ref 8.9–10.3)
Chloride: 97 mmol/L — ABNORMAL LOW (ref 98–111)
Creatinine, Ser: 1.44 mg/dL — ABNORMAL HIGH (ref 0.61–1.24)
GFR calc Af Amer: 56 mL/min — ABNORMAL LOW (ref >60)
GFR calc non Af Amer: 48 mL/min — ABNORMAL LOW (ref >60)
Glucose, Bld: 74 mg/dL (ref 70–99)
Potassium: 3.9 mmol/L (ref 3.5–5.1)
Sodium: 137 mmol/L (ref 135–145)
Total Bilirubin: 0.5 mg/dL (ref 0.3–1.2)
Total Protein: 7.2 g/dL (ref 6.5–8.1)

## 2019-12-03 LAB — DIFFERENTIAL
Abs Immature Granulocytes: 0.11 10*3/uL — ABNORMAL HIGH (ref 0.00–0.07)
Basophils Absolute: 0.1 10*3/uL (ref 0.0–0.1)
Basophils Relative: 1 %
Eosinophils Absolute: 0.3 10*3/uL (ref 0.0–0.5)
Eosinophils Relative: 2 %
Immature Granulocytes: 1 %
Lymphocytes Relative: 29 %
Lymphs Abs: 4 10*3/uL (ref 0.7–4.0)
Monocytes Absolute: 1.1 10*3/uL — ABNORMAL HIGH (ref 0.1–1.0)
Monocytes Relative: 8 %
Neutro Abs: 8.2 10*3/uL — ABNORMAL HIGH (ref 1.7–7.7)
Neutrophils Relative %: 59 %

## 2019-12-03 LAB — CBC
HCT: 36.3 % — ABNORMAL LOW (ref 39.0–52.0)
Hemoglobin: 10.9 g/dL — ABNORMAL LOW (ref 13.0–17.0)
MCH: 26.8 pg (ref 26.0–34.0)
MCHC: 30 g/dL (ref 30.0–36.0)
MCV: 89.4 fL (ref 80.0–100.0)
Platelets: 393 10*3/uL (ref 150–400)
RBC: 4.06 MIL/uL — ABNORMAL LOW (ref 4.22–5.81)
RDW: 14.6 % (ref 11.5–15.5)
WBC: 13.8 10*3/uL — ABNORMAL HIGH (ref 4.0–10.5)
nRBC: 0 % (ref 0.0–0.2)

## 2019-12-03 LAB — I-STAT CHEM 8, ED
BUN: 30 mg/dL — ABNORMAL HIGH (ref 8–23)
Calcium, Ion: 1.17 mmol/L (ref 1.15–1.40)
Chloride: 100 mmol/L (ref 98–111)
Creatinine, Ser: 1.3 mg/dL — ABNORMAL HIGH (ref 0.61–1.24)
Glucose, Bld: 74 mg/dL (ref 70–99)
HCT: 36 % — ABNORMAL LOW (ref 39.0–52.0)
Hemoglobin: 12.2 g/dL — ABNORMAL LOW (ref 13.0–17.0)
Potassium: 3.9 mmol/L (ref 3.5–5.1)
Sodium: 142 mmol/L (ref 135–145)
TCO2: 35 mmol/L — ABNORMAL HIGH (ref 22–32)

## 2019-12-03 LAB — CBG MONITORING, ED
Glucose-Capillary: 63 mg/dL — ABNORMAL LOW (ref 70–99)
Glucose-Capillary: 54 mg/dL — ABNORMAL LOW (ref 70–99)
Glucose-Capillary: 119 mg/dL — ABNORMAL HIGH (ref 70–99)

## 2019-12-03 LAB — APTT: aPTT: 39 seconds — ABNORMAL HIGH (ref 24–36)

## 2019-12-03 MED ORDER — DEXTROSE 50 % IV SOLN
INTRAVENOUS | Status: AC | PRN
  Administered 2019-12-03: 25 mL via INTRAVENOUS

## 2019-12-03 MED ORDER — IOHEXOL 350 MG/ML SOLN
75.0000 mL | Freq: Once | INTRAVENOUS | Status: AC | PRN
  Administered 2019-12-03: 75 mL via INTRAVENOUS

## 2019-12-03 MED ORDER — DEXTROSE 50 % IV SOLN
1.0000 | Freq: Once | INTRAVENOUS | Status: AC
  Administered 2019-12-03: 50 mL via INTRAVENOUS
  Filled 2019-12-03: qty 50

## 2019-12-03 MED ORDER — SODIUM CHLORIDE 0.9% FLUSH
3.0000 mL | Freq: Once | INTRAVENOUS | Status: AC
  Administered 2019-12-04: 3 mL via INTRAVENOUS

## 2019-12-03 NOTE — Consult Note (Signed)
Referring Physician: Dr. Kathrynn Humble    Chief Complaint: Acute onset of left hemiplegia, left facial droop and dysarthria  HPI: Brandon Sanchez is an 74 y.o. male presenting acutely from home via EMS after acute onset of left hemiplegia, left facial droop and dysarthria. He has atrial fibrillation and is on Eliquis, with which he is compliant. He has no prior history of stroke or MI. He denies a headache. No nausea or vomiting. He endorses left sided weakness on arrival to the ED and is fully oriented.   LSN: [id number] tPA Given: No: On Eliquis.   Past Medical History:  Diagnosis Date  . Allergic rhinitis, cause unspecified   . Asthma   . Other and unspecified hyperlipidemia   . Type II or unspecified type diabetes mellitus without mention of complication, not stated as uncontrolled   . Unspecified essential hypertension   Atrial fibrillation  Past Surgical History:  Procedure Laterality Date  . CARDIOVERSION N/A 09/22/2019   Procedure: CARDIOVERSION;  Surgeon: Donato Heinz, MD;  [facility] END[facility]COPY;  Service: Endoscopy;  Laterality: N/A;  . CARDIOVERSION N/A 10/18/2019   Procedure: CARDIOVERSION;  Surgeon: Jerline Pain, MD;  Location: [facility] ENDOSCOPY;  Service: Cardiovascular;  Laterality: N/A;  . TEE WITHOUT CARDIOVERSION N/A 09/22/2019   Procedure: TRANSESOPHAGEAL ECHOCARDIOGRAM (TEE);  Surgeon: Donato Heinz, MD;  Location: [facility] ENDOSCOPY;  Service: Endoscopy;  Laterality: N/A;    Family History  Problem Relation Age of Onset  . Colon cancer Father   . Heart disease Paternal Grandfather    Social History:  reports that he has never smoked. He has never used smokeless tobacco. He reports previous alcohol use of about 3.0 standard drinks of alcohol per week. He reports that he does not use drugs.  Allergies: No Known Allergies  Home Medications:  No current facility-administered medications on file prior to encounter.   Current Outpatient Medications on File Prior to  Encounter  Medication Sig Dispense Refill  . acetaminophen (TYLENOL) 500 MG tablet Take 1,000 mg by mouth every 6 (six) hours as needed for moderate pain or headache.     . albuterol (PROAIR HFA) 108 (90 BASE) MCG/ACT inhaler 2 puffs every 4 hours as needed only  if your can't catch your breath (Patient taking differently: Inhale 2 puffs into the lungs every 4 (four) hours as needed for wheezing. ) 1 Inhaler 1  . amiodarone (PACERONE) 200 MG tablet Take 1 tablet (200 mg total) by mouth daily. 90 tablet 2  . apixaban (ELIQUIS) 5 MG TABS tablet Take 1 tablet (5 mg total) by mouth 2 (two) times daily. 60 tablet 6  . atorvastatin (LIPITOR) 40 MG tablet Take 40 mg by mouth every evening.     Marland Kitchen BREO ELLIPTA 200-25 MCG/INH AEPB Inhale 1 puff into the lungs daily as needed (shortness of breath).     . furosemide (LASIX) 40 MG tablet Take 2 tablets (80 mg total) by mouth daily. May take additional 40 mg if notice swelling of shortness of breath 30 tablet   . Glucosamine-Chondroit-Vit C-Mn (GLUCOSAMINE CHONDR 1500 COMPLX) CAPS Take 1 capsule by mouth 2 (two) times daily.    . insulin aspart protamine- aspart (NOVOLOG MIX 70/30) (70-30) 100 UNIT/ML injection Inject 30 Units into the skin 2 (two) times daily at 8 am and 10 pm.     . metoprolol succinate (TOPROL-XL) 50 MG 24 hr tablet Take 1 tablet (50 mg total) by mouth daily. Take with or immediately following a meal. 90 tablet  1  . Multiple Vitamin (MULTIVITAMIN WITH MINERALS) TABS tablet Take 1 tablet by mouth daily.    Marland Kitchen omeprazole (PRILOSEC) 40 MG capsule Take 40 mg by mouth daily as needed (heart burn).     Letta Pate VERIO test strip USE AS DIRECTED TO TEST BLOOD SUGAR THREE TIMES DAILY FOR 30 DAYS    . potassium chloride (KLOR-CON) 10 MEQ tablet Take 10 mEq by mouth at bedtime.    Marland Kitchen PREVIDENT 5000 BOOSTER PLUS 1.1 % PSTE Place 1 application onto teeth in the morning and at bedtime.     . sertraline (ZOLOFT) 100 MG tablet Take 100 mg by mouth daily.        ROS: As per HPI. Comprehensive ROS otherwise negative.   Physical Examination: There were no vitals taken for this visit.  HEENT: [place]/AT Lungs: Respirations unlabored Ext: Non-cyanotic  Neurologic Examination: Exam performed after improvement following CT   Mental Status: Alert and oriented x 5. Speech fluent with intact comprehension and naming. Able to follow all commands. No dysarthria. No hemineglect or anosognosia.  Cranial Nerves: (Exam performed after improvement following CT) II:  Visual fields intact with no extinction to DSS. PERRL.  III,IV, VI: No ptosis. EOMI.  V,VII: Mild decreased NL fold on the left. Temp sensation equal bilaterally.  VIII: hearing intact to voice IX,X: Palate rises symmetrically XI: Symmetric XII: midline tongue extension  Motor: On arrival to the ED:  LUE: Weak and low amplitude movements of digits in abduction and extension at 2/5; grip 2/5. Trace left elbow flexion and extension at 1/5. Deltoid and shoulder shrug 0/5.  LLE: Minimal movement.  RUE Normal movement RLE Normal movement After clinical improvement following CT: LUE: 3-4/5 LLE: 5/5 RUE and RLE 5/5 Sensory: Temp and light touch intact bilateral upper and lower extremities. No extinction to DSS.  Deep Tendon Reflexes:  2+ bilateral brachioradialis, biceps and patellae.  Plantars: Mute bilaterally Cerebellar: No ataxia with FNF on the right. Mild ataxia with FNF on the left.   Gait: Deferred   Results for orders placed or performed during the hospital encounter of 12/03/19 (from the past 48 hour(s))  CBG monitoring, ED     Status: Abnormal   Collection Time: 12/03/19  9:02 PM  Result Value Ref Range   Glucose-Capillary 63 (L) 70 - 99 mg/dL    Comment: Glucose reference range applies only to samples taken after fasting for at least 8 hours.   Comment 1 Notify RN    No results found.  Assessment: 74 y.o. male presenting with acute onset of left hemiplegia, left facial  droop and dysarthria.  1. Has a history of atrial fibrillation and is on Eliquis, with which he has been compliant.  2. CT head shows no acute intracranial abnormality. There is mild generalized atrophy. Chronic microvascular ischemic changes are also noted.  3. CTA head and neck:: No emergent large vessel occlusion. Atherosclerotic changes in the distal common carotid arteries and at the carotid bifurcations are present bilaterally, without significant stenosis. 4. NIHSS: 10 initially, then decreased to 5 and at time of repeat Neurological exam by attending NIHSS is 2 5. Stroke Risk Factors - HLD, HTN, DM and atrial fibrillation.  6. CBGs x 2 have been low, but above 50. Suspect that hypoglycemia may be playing a role in this presentation, although TIA is felt to be equally likely.   Recommendations: 1. HgbA1c, fasting lipid panel 2. MRI of the brain without contrast 3. PT consult, OT consult, Speech consult  4. Echocardiogram 5. Frequent neuro checks 6. Prophylactic therapy- Continue Eliquis 7. Permissive HTN x 24 hours 8. Risk factor modification 9. Telemetry monitoring 10. Continue atorvastatin   @Electronically  signed: Dr. 12/03/2019, 9:08 PM

## 2019-12-03 NOTE — Code Documentation (Signed)
Responded to Code Stroke called at 1942 for facial droop, L sided weakness, and slurred speech, LSN-2000. Pt arrived at 1958, CBG-63, NIH-10. Pt given 1/2 amp D50 while in CT. CT head negative, CTA-no LVO. Once pt in ED room, his symptoms improved, NIH-5. Pt placed on TIA alert.

## 2019-12-03 NOTE — ED Triage Notes (Signed)
Pt BIB [facility] r/t c/o of left sided weakness and facial droop. Pt was alert and responding to commands on arrival. PT exhibited no effort against gravity in LUE and LLE. CBG was 63 upon arrival.

## 2019-12-03 NOTE — ED Notes (Signed)
Condom Cath placed

## 2019-12-03 NOTE — ED Provider Notes (Signed)
[facility] EMERGENCY DEPARTMENT Provider Note   CSN: [id number] Arrival date & time: 12/03/19  2058  An emergency department physician performed an initial assessment on this suspected stroke patient at 2059.  History Chief Complaint  Patient presents with   Code Stroke    Brandon Sanchez is a 74 y.o. male.  The history is provided by the patient and the EMS personnel. The history is limited by the condition of the patient.      74 year old male with past medical history of paroxysmal A. fib on Eliquis, asthma, type 2 diabetes, HTN, HLD presenting to the emergency department brought in by EMS as a code stroke complaining of slurred speech, acute left-sided hemiplegia, left sided sensation changes, L sided facial droop.  Last known normal 2000.  Patient's blood glucose upon arrival was 63. No prior history of CVA or MI. He denies HA, N/V. He denies recent illness, cough, congestion, rhinorrhea, sob, chest pain, abdominal pain, changes in bowel or bladder function.  Past Medical History:  Diagnosis Date   Allergic rhinitis, cause unspecified    Asthma    Other and unspecified hyperlipidemia    Type II or unspecified type diabetes mellitus without mention of complication, not stated as uncontrolled    Unspecified essential hypertension     Patient Active Problem List   Diagnosis Date Noted   Paroxysmal atrial fibrillation (HCC)    Persistent atrial fibrillation (HCC) 10/12/2019   Secondary hypercoagulable state (HCC) 09/28/2019   Atrial fibrillation with rapid ventricular response (HCC) 09/20/2019   CAP (community acquired pneumonia) 09/20/2019   Obesity 05/12/2015   Diabetes mellitus (HCC) 02/04/2008   HLD (hyperlipidemia) 02/04/2008   HTN (hypertension) 02/04/2008   ALLERGIC RHINITIS 02/04/2008   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 02/04/2008    Past Surgical History:  Procedure Laterality Date   CARDIOVERSION N/A  09/22/2019   Procedure: CARDIOVERSION;  Surgeon: [name], MD;  Location: [facility] ENDOSCOPY;  Service: Endoscopy;  Laterality: N/A;   CARDIOVERSION N/A 10/18/2019   Procedure: CARDIOVERSION;  Surgeon: Jake Bathe, MD;  Location: [facility] ENDOSCOPY;  Service: Cardiovascular;  Laterality: N/A;   TEE WITHOUT CARDIOVERSION N/A 09/22/2019   Procedure: TRANSESOPHAGEAL ECHOCARDIOGRAM (TEE);  Surgeon: [name], MD;  Location: [facility] ENDOSCOPY;  Service: Endoscopy;  Laterality: N/A;       Family History  Problem Relation Age of Onset   Colon cancer Father    Heart disease Paternal Grandfather     Social History   Tobacco Use   Smoking status: Never Smoker   Smokeless tobacco: Never Used  Substance Use Topics   Alcohol use: Not Currently    Alcohol/week: 3.0 standard drinks    Types: 3 Glasses of wine per week   Drug use: Never    Home Medications Prior to Admission medications   Medication Sig Start Date End Date Taking? Authorizing Provider  acetaminophen (TYLENOL) 500 MG tablet Take 1,000 mg by mouth every 6 (six) hours as needed for moderate pain or headache.     [provider]  albuterol (PROAIR HFA) 108 (90 BASE) MCG/ACT inhaler 2 puffs every 4 hours as needed only  if your can't catch your breath Patient taking differently: Inhale 2 puffs into the lungs every 4 (four) hours as needed for wheezing.  05/12/15   Nyoka Cowden, MD  amiodarone (PACERONE) 200 MG tablet Take 1 tablet (200 mg total) by mouth daily. 10/21/19   Fenton, Clint R, PA  apixaban (ELIQUIS) 5  MG TABS tablet Take 1 tablet (5 mg total) by mouth 2 (two) times daily. 10/21/19   Fenton, Clint R, PA  atorvastatin (LIPITOR) 40 MG tablet Take 40 mg by mouth every evening.     [provider]  BREO ELLIPTA 200-25 MCG/INH AEPB Inhale 1 puff into the lungs daily as needed (shortness of breath).  08/24/19   [provider]  furosemide (LASIX) 40 MG tablet Take 2 tablets (80 mg total)  by mouth daily. May take additional 40 mg if notice swelling of shortness of breath 11/02/19   Jodelle Redhristopher, Bridgette, MD  Glucosamine-Chondroit-Vit C-Mn (GLUCOSAMINE CHONDR 1500 COMPLX) CAPS Take 1 capsule by mouth 2 (two) times daily.    [provider]  insulin aspart protamine- aspart (NOVOLOG MIX 70/30) (70-30) 100 UNIT/ML injection Inject 30 Units into the skin 2 (two) times daily at 8 am and 10 pm.     [provider]  metoprolol succinate (TOPROL-XL) 50 MG 24 hr tablet Take 1 tablet (50 mg total) by mouth daily. Take with or immediately following a meal. 11/09/19   Camnitz, Andree CossWill Martin, MD  Multiple Vitamin (MULTIVITAMIN WITH MINERALS) TABS tablet Take 1 tablet by mouth daily.    [provider]  omeprazole (PRILOSEC) 40 MG capsule Take 40 mg by mouth daily as needed (heart burn).     [provider]  ONETOUCH VERIO test strip USE AS DIRECTED TO TEST BLOOD SUGAR THREE TIMES DAILY FOR 30 DAYS 07/26/19   [provider]  potassium chloride (KLOR-CON) 10 MEQ tablet Take 10 mEq by mouth at bedtime.    [provider]  PREVIDENT 5000 BOOSTER PLUS 1.1 % PSTE Place 1 application onto teeth in the morning and at bedtime.  04/13/19   [provider]  sertraline (ZOLOFT) 100 MG tablet Take 100 mg by mouth daily.    [provider]    Allergies    Patient has no known allergies.  Review of Systems   Review of Systems  Unable to perform ROS: Acuity of condition    Physical Exam Updated Vital Signs BP 100/63    Pulse 68    Temp 98.6 F (37 C) (Oral)    Resp 13    Ht 6\' 2"  (1.88 m)    Wt 125.2 kg    SpO2 95%    BMI 35.44 kg/m   Physical Exam Vitals and nursing note reviewed.  Constitutional:      General: He is not in acute distress.    Appearance: Normal appearance. He is normal weight. He is not ill-appearing or toxic-appearing.  HENT:     Head: Normocephalic.     Right Ear: External ear normal.     Left Ear: External  ear normal.     Nose: Nose normal.     Mouth/Throat:     Mouth: Mucous membranes are moist.     Pharynx: Oropharynx is clear.  Eyes:     Extraocular Movements: Extraocular movements intact.  Cardiovascular:     Rate and Rhythm: Normal rate and regular rhythm.     Pulses: Normal pulses.     Heart sounds: Normal heart sounds.  Pulmonary:     Effort: Pulmonary effort is normal. No respiratory distress.     Breath sounds: Normal breath sounds. No wheezing or rhonchi.  Abdominal:     General: Bowel sounds are normal.     Palpations: Abdomen is soft.     Tenderness: There is no abdominal tenderness. There is no  guarding.  Musculoskeletal:     Cervical back: Normal range of motion.     Right lower leg: No edema.     Left lower leg: No edema.  Skin:    General: Skin is warm and dry.     Capillary Refill: Capillary refill takes less than 2 seconds.     Findings: Bruising:    Neurological:     General: No focal deficit present.     Mental Status: He is alert and oriented to person, place, and time. Mental status is at baseline.     Cranial Nerves: Dysarthria present.     Sensory: Sensory deficit present.     Motor: Weakness present.     Deep Tendon Reflexes: Reflexes are normal and symmetric.     Comments: Gait evaluation deferred  Psychiatric:        Mood and Affect: Mood normal.     ED Results / Procedures / Treatments   Labs (all labs ordered are listed, but only abnormal results are displayed) Labs Reviewed  CBC - Abnormal; Notable for the following components:      Result Value   WBC 13.8 (*)    RBC 4.06 (*)    Hemoglobin 10.9 (*)    HCT 36.3 (*)    All other components within normal limits  DIFFERENTIAL - Abnormal; Notable for the following components:   Neutro Abs 8.2 (*)    Monocytes Absolute 1.1 (*)    Abs Immature Granulocytes 0.11 (*)    All other components within normal limits  COMPREHENSIVE METABOLIC PANEL - Abnormal; Notable for the following components:     Chloride 97 (*)    BUN 27 (*)    Creatinine, Ser 1.44 (*)    Albumin 2.8 (*)    GFR calc non Af Amer 48 (*)    GFR calc Af Amer 56 (*)    All other components within normal limits  APTT - Abnormal; Notable for the following components:   aPTT 39 (*)    All other components within normal limits  I-STAT CHEM 8, ED - Abnormal; Notable for the following components:   BUN 30 (*)    Creatinine, Ser 1.30 (*)    TCO2 35 (*)    Hemoglobin 12.2 (*)    HCT 36.0 (*)    All other components within normal limits  CBG MONITORING, ED - Abnormal; Notable for the following components:   Glucose-Capillary 63 (*)    All other components within normal limits  CBG MONITORING, ED - Abnormal; Notable for the following components:   Glucose-Capillary 54 (*)    All other components within normal limits  CBG MONITORING, ED - Abnormal; Notable for the following components:   Glucose-Capillary 119 (*)    All other components within normal limits  PROTIME-INR  CBG MONITORING, ED    EKG None  Radiology CT Code Stroke CTA Head W/WO contrast  Result Date: 12/03/2019 CLINICAL DATA:  Left-sided weakness. EXAM: CT ANGIOGRAPHY HEAD AND NECK TECHNIQUE: Multidetector CT imaging of the head and neck was performed using the standard protocol during bolus administration of intravenous contrast. Multiplanar CT image reconstructions and MIPs were obtained to evaluate the vascular anatomy. Carotid stenosis measurements (when applicable) are obtained utilizing NASCET criteria, using the distal internal carotid diameter as the denominator. CONTRAST:  75mL OMNIPAQUE IOHEXOL 350 MG/ML SOLN COMPARISON:  CT head without contrast 12/03/2019 FINDINGS: CTA NECK FINDINGS Aortic arch: A 3 vessel arch configuration is present. Atherosclerotic calcifications are present without  aneurysm or stenosis. Right carotid system: Atherosclerotic calcifications are present the wall of distal right common carotid artery without significant  stenosis. Atherosclerotic calcifications are present at the bifurcation. Mixed density plaque is noted laterally without significant stenosis. Minimal luminal diameter is 2.5 mm. Left carotid system: The left common carotid artery also demonstrates some distal mural calcifications. Calcifications are present at the bifurcation without significant stenosis. Distal cervical left ICA is normal. Vertebral arteries: The vertebral arteries are codominant. High-grade stenosis is present at the origin of the left vertebral artery. A 50% narrowing is present the origin of the right vertebral artery. No additional stenoses are present in either vertebral artery in the neck. Skeleton: Acquired fusion is present across the disc space at C3-4 fusion is also noted across the disc space at C6-7. Vertebral body heights and alignment are maintained. No focal lytic or blastic lesions are present. Other neck: Soft tissues of the neck are unremarkable. Upper chest: Prominent left pleural effusion is present. Associated atelectasis is present. Right lung is clear. Subcentimeter mediastinal nodes are likely reactive. Review of the MIP images confirms the above findings CTA HEAD FINDINGS Anterior circulation: Atherosclerotic calcifications are present supraclinoid internal carotid arteries bilaterally without a significant stenosis relative to the ICA termini. The A1 and M1 segments are normal. MCA bifurcations intact. The ACA and MCA branch vessels are within normal limits. Posterior circulation: Left vertebral artery is dominant. PICA origins are visualized and. The vertebrobasilar junction is within normal limits. Basilar artery is. Both posterior cerebral arteries originate basilar tip. A prominent right posterior communicating artery contributes. Venous sinuses: The dural sinuses are patent. The straight sinus deep cerebral veins intact. Cortical veins are unremarkable. Anatomic variants: Prominent right posterior communicating  artery. Review of the MIP images confirms the above findings IMPRESSION: 1. No emergent large vessel occlusion 2. Atherosclerotic changes in the distal common carotid arteries and at the carotid bifurcations bilaterally significant stenosis. 3.  Aortic Atherosclerosis (ICD10-I70.0). 4. Degenerative changes of the cervical spine including fusion at C3-4 and C6-7. These results were called by telephone at the time of interpretation on 12/03/2019 at 9:35pm to provider ERIC [facility] , who verbally acknowledged these results. Electronically Signed   By: Marin Roberts M.D.   On: 12/03/2019 21:41   CT Code Stroke CTA Neck W/WO contrast  Result Date: 12/03/2019 CLINICAL DATA:  Left-sided weakness. EXAM: CT ANGIOGRAPHY HEAD AND NECK TECHNIQUE: Multidetector CT imaging of the head and neck was performed using the standard protocol during bolus administration of intravenous contrast. Multiplanar CT image reconstructions and MIPs were obtained to evaluate the vascular anatomy. Carotid stenosis measurements (when applicable) are obtained utilizing NASCET criteria, using the distal internal carotid diameter as the denominator. CONTRAST:  41mL OMNIPAQUE IOHEXOL 350 MG/ML SOLN COMPARISON:  CT head without contrast 12/03/2019 FINDINGS: CTA NECK FINDINGS Aortic arch: A 3 vessel arch configuration is present. Atherosclerotic calcifications are present without aneurysm or stenosis. Right carotid system: Atherosclerotic calcifications are present the wall of distal right common carotid artery without significant stenosis. Atherosclerotic calcifications are present at the bifurcation. Mixed density plaque is noted laterally without significant stenosis. Minimal luminal diameter is 2.5 mm. Left carotid system: The left common carotid artery also demonstrates some distal mural calcifications. Calcifications are present at the bifurcation without significant stenosis. Distal cervical left ICA is normal. Vertebral arteries: The  vertebral arteries are codominant. High-grade stenosis is present at the origin of the left vertebral artery. A 50% narrowing is present the origin of the right vertebral  artery. No additional stenoses are present in either vertebral artery in the neck. Skeleton: Acquired fusion is present across the disc space at C3-4 fusion is also noted across the disc space at C6-7. Vertebral body heights and alignment are maintained. No focal lytic or blastic lesions are present. Other neck: Soft tissues of the neck are unremarkable. Upper chest: Prominent left pleural effusion is present. Associated atelectasis is present. Right lung is clear. Subcentimeter mediastinal nodes are likely reactive. Review of the MIP images confirms the above findings CTA HEAD FINDINGS Anterior circulation: Atherosclerotic calcifications are present supraclinoid internal carotid arteries bilaterally without a significant stenosis relative to the ICA termini. The A1 and M1 segments are normal. MCA bifurcations intact. The ACA and MCA branch vessels are within normal limits. Posterior circulation: Left vertebral artery is dominant. PICA origins are visualized and. The vertebrobasilar junction is within normal limits. Basilar artery is. Both posterior cerebral arteries originate basilar tip. A prominent right posterior communicating artery contributes. Venous sinuses: The dural sinuses are patent. The straight sinus deep cerebral veins intact. Cortical veins are unremarkable. Anatomic variants: Prominent right posterior communicating artery. Review of the MIP images confirms the above findings IMPRESSION: 1. No emergent large vessel occlusion 2. Atherosclerotic changes in the distal common carotid arteries and at the carotid bifurcations bilaterally significant stenosis. 3.  Aortic Atherosclerosis (ICD10-I70.0). 4. Degenerative changes of the cervical spine including fusion at C3-4 and C6-7. These results were called by telephone at the time of  interpretation on 12/03/2019 at 9:35pm to provider ERIC [facility] , who verbally acknowledged these results. Electronically Signed   By: Marin Roberts M.D.   On: 12/03/2019 21:41   CT HEAD CODE STROKE WO CONTRAST  Result Date: 12/03/2019 CLINICAL DATA:  Code stroke.  Facial droop and left-sided weakness. EXAM: CT HEAD WITHOUT CONTRAST TECHNIQUE: Contiguous axial images were obtained from the base of the skull through the vertex without intravenous contrast. COMPARISON:  None. FINDINGS: Brain: Mild generalized atrophy and white matter disease is present. No acute infarct, hemorrhage, or mass lesion is present. Basal ganglia are intact. Insular ribbon is normal bilaterally. No acute or focal cortical abnormality is present. The ventricles are of normal size. No significant extraaxial fluid collection is present. The brainstem and cerebellum are within normal limits. Vascular: Atherosclerotic changes are present within the cavernous internal carotid arteries. No hyperdense vessel is present. Skull: Calvarium is intact. No focal lytic or blastic lesions are present. No significant extracranial soft tissue lesion is present. Sinuses/Orbits: Mucosal scratched at circumferential mucosal thickening is present in the maxillary sinuses, left greater than right. No fluid levels are present. Posterior left ethmoid air cells opacified. Mucosal thickening is present in the inferior frontal sinuses bilaterally. Mastoid air cells are clear. Globes and orbits are within normal limits. ASPECTS [facility] Stroke Program Early CT Score) - Ganglionic level infarction (caudate, lentiform nuclei, internal capsule, insula, M1-M3 cortex): 7/7 - Supraganglionic infarction (M4-M6 cortex): 3/3 Total score (0-10 with 10 being normal): 10/10 IMPRESSION: 1. No acute intracranial abnormality. 2. Mild generalized atrophy and white matter disease. This likely reflects the sequela of chronic microvascular ischemia. 3. ASPECTS is 10/10. 4. Mild  sinus disease. The above was relayed via text pager to Dr. Otelia Limes on 12/03/2019 at 21:20 . Electronically Signed   By: Marin Roberts M.D.   On: 12/03/2019 21:20    Procedures Procedures (including critical care time)  Medications Ordered in ED Medications  sodium chloride flush (NS) 0.9 % injection 3 mL (has no administration  in time range)  iohexol (OMNIPAQUE) 350 MG/ML injection 75 mL (75 mLs Intravenous Contrast Given 12/03/19 2125)  dextrose 50 % solution (25 mLs Intravenous Given 12/03/19 2105)  dextrose 50 % solution 50 mL (50 mLs Intravenous Given 12/03/19 2222)    ED Course  I have reviewed the triage vital signs and the nursing notes.  Pertinent labs & imaging results that were available during my care of the patient were reviewed by me and considered in my medical decision making (see chart for details).    MDM Rules/Calculators/A&P                      73 year old male with past medical history of paroxysmal A. fib on Eliquis, asthma, type 2 diabetes, HTN, HLD presenting to the emergency department brought in by EMS as a code stroke complaining of slurred speech, left-sided weakness and left-sided numbness.   Differential diagnoses considered include ICH, CVA, hypoglycemia, cardiac failure leading to systemic hypoperfusion  Neurology at bedside upon arrival. Patient taken swiftly to the CT scanner for CT code stroke imaging. 1/2 Amp of D50 administered in the setting of hypoglycemia.   Patient only an endovascular candidate in the setting of anticoagulation.  Initial NIHSS 10. Repeat NIHSS 5 following CT imaging and placement in ED exam room.   CT stroke imaging demonstrated no acute intracranial abnormality, mild atrophy and white matter disease, no emergent large vessel occlusion, atherosclerotic changes in the distal common carotid arteries and at the carotid bifurcations bilaterally significant stenosis.  Repeat POC glucose after 1/2 amp D50 was 53. Full Ampule  of D50 given.   ECG interpreted by me demonstrated Sinus rhythm at 68 bpm, left axis deviation, LAFB, prolonged QTC of 686 ms otherwise normal intervals, incomplete RBBB, nonspecific T wave flattening in the lateral and inferior leads overall similar compared to previous on 10/25/2019  Labs demonstrated BUN of 30 with a creatinine 1.3 on i-STAT chemistry, leukocytosis to 13.8 with left shift, normocytic anemia hemoglobin 10.9 MCV of 89.4, platelets 393, CMP with mild hypochloremia 97, BUN 27 creatinine 1.44, mild hypoalbuminemia to 2.8, aPTT 39.   Upon reassessment patient's left-sided hemiparesis is improved with ability to lift his left upper and lower extremity against gravity, appreciable sensation to light touch though still mildly diminished compared to the right side, improved left-sided facial droop. Repeat glucose following full amp of D50 was 119 from 54.   Following neurology's bedside evaluation suspicion is this is likely secondary to TIA  We will admit to hospitalist for TIA/stroke evaluation  The plan for this patient was discussed with Dr. Kathrynn Humble, who voiced agreement and who oversaw evaluation and treatment of this patient.  Final Clinical Impression(s) / ED Diagnoses Final diagnoses:  Slurred speech  Left-sided weakness  Numbness  Dysarthria    Rx / DC Orders ED Discharge Orders    None       Filbert Berthold, MD 12/03/19 [id number]    Varney Biles, MD 12/04/19 0005

## 2019-12-03 NOTE — ED Notes (Signed)
Pt family would like to be called when/if pt gets admitted.

## 2019-12-04 ENCOUNTER — Inpatient Hospital Stay (HOSPITAL_COMMUNITY): Payer: Medicare Other

## 2019-12-04 ENCOUNTER — Observation Stay (HOSPITAL_COMMUNITY): Payer: Medicare Other

## 2019-12-04 ENCOUNTER — Other Ambulatory Visit: Payer: Self-pay

## 2019-12-04 DIAGNOSIS — R29710 NIHSS score 10: Secondary | ICD-10-CM | POA: Diagnosis present

## 2019-12-04 DIAGNOSIS — I69354 Hemiplegia and hemiparesis following cerebral infarction affecting left non-dominant side: Secondary | ICD-10-CM | POA: Diagnosis not present

## 2019-12-04 DIAGNOSIS — R2689 Other abnormalities of gait and mobility: Secondary | ICD-10-CM | POA: Diagnosis not present

## 2019-12-04 DIAGNOSIS — E876 Hypokalemia: Secondary | ICD-10-CM | POA: Diagnosis present

## 2019-12-04 DIAGNOSIS — K219 Gastro-esophageal reflux disease without esophagitis: Secondary | ICD-10-CM | POA: Diagnosis present

## 2019-12-04 DIAGNOSIS — Z20822 Contact with and (suspected) exposure to covid-19: Secondary | ICD-10-CM | POA: Diagnosis present

## 2019-12-04 DIAGNOSIS — I5032 Chronic diastolic (congestive) heart failure: Secondary | ICD-10-CM | POA: Diagnosis not present

## 2019-12-04 DIAGNOSIS — H3530 Unspecified macular degeneration: Secondary | ICD-10-CM | POA: Diagnosis present

## 2019-12-04 DIAGNOSIS — I6340 Cerebral infarction due to embolism of unspecified cerebral artery: Secondary | ICD-10-CM | POA: Insufficient documentation

## 2019-12-04 DIAGNOSIS — H25013 Cortical age-related cataract, bilateral: Secondary | ICD-10-CM | POA: Diagnosis present

## 2019-12-04 DIAGNOSIS — E11649 Type 2 diabetes mellitus with hypoglycemia without coma: Secondary | ICD-10-CM | POA: Diagnosis not present

## 2019-12-04 DIAGNOSIS — Z8249 Family history of ischemic heart disease and other diseases of the circulatory system: Secondary | ICD-10-CM | POA: Diagnosis not present

## 2019-12-04 DIAGNOSIS — H538 Other visual disturbances: Secondary | ICD-10-CM | POA: Diagnosis present

## 2019-12-04 DIAGNOSIS — I350 Nonrheumatic aortic (valve) stenosis: Secondary | ICD-10-CM | POA: Diagnosis present

## 2019-12-04 DIAGNOSIS — K5901 Slow transit constipation: Secondary | ICD-10-CM | POA: Diagnosis not present

## 2019-12-04 DIAGNOSIS — E1169 Type 2 diabetes mellitus with other specified complication: Secondary | ICD-10-CM

## 2019-12-04 DIAGNOSIS — Z6834 Body mass index (BMI) 34.0-34.9, adult: Secondary | ICD-10-CM | POA: Diagnosis not present

## 2019-12-04 DIAGNOSIS — I69392 Facial weakness following cerebral infarction: Secondary | ICD-10-CM | POA: Diagnosis not present

## 2019-12-04 DIAGNOSIS — M79675 Pain in left toe(s): Secondary | ICD-10-CM | POA: Diagnosis not present

## 2019-12-04 DIAGNOSIS — Z79899 Other long term (current) drug therapy: Secondary | ICD-10-CM | POA: Diagnosis not present

## 2019-12-04 DIAGNOSIS — R29810 Facial weakness: Secondary | ICD-10-CM | POA: Diagnosis present

## 2019-12-04 DIAGNOSIS — I352 Nonrheumatic aortic (valve) stenosis with insufficiency: Secondary | ICD-10-CM

## 2019-12-04 DIAGNOSIS — I6389 Other cerebral infarction: Secondary | ICD-10-CM | POA: Diagnosis present

## 2019-12-04 DIAGNOSIS — Z794 Long term (current) use of insulin: Secondary | ICD-10-CM | POA: Diagnosis not present

## 2019-12-04 DIAGNOSIS — R531 Weakness: Secondary | ICD-10-CM | POA: Diagnosis not present

## 2019-12-04 DIAGNOSIS — F329 Major depressive disorder, single episode, unspecified: Secondary | ICD-10-CM | POA: Diagnosis present

## 2019-12-04 DIAGNOSIS — E782 Mixed hyperlipidemia: Secondary | ICD-10-CM | POA: Diagnosis not present

## 2019-12-04 DIAGNOSIS — G8194 Hemiplegia, unspecified affecting left nondominant side: Secondary | ICD-10-CM | POA: Diagnosis present

## 2019-12-04 DIAGNOSIS — I351 Nonrheumatic aortic (valve) insufficiency: Secondary | ICD-10-CM | POA: Diagnosis not present

## 2019-12-04 DIAGNOSIS — R200 Anesthesia of skin: Secondary | ICD-10-CM | POA: Diagnosis not present

## 2019-12-04 DIAGNOSIS — R7989 Other specified abnormal findings of blood chemistry: Secondary | ICD-10-CM | POA: Diagnosis present

## 2019-12-04 DIAGNOSIS — I69322 Dysarthria following cerebral infarction: Secondary | ICD-10-CM | POA: Diagnosis not present

## 2019-12-04 DIAGNOSIS — I63131 Cerebral infarction due to embolism of right carotid artery: Secondary | ICD-10-CM | POA: Diagnosis not present

## 2019-12-04 DIAGNOSIS — Z7951 Long term (current) use of inhaled steroids: Secondary | ICD-10-CM | POA: Diagnosis not present

## 2019-12-04 DIAGNOSIS — E1165 Type 2 diabetes mellitus with hyperglycemia: Secondary | ICD-10-CM | POA: Diagnosis not present

## 2019-12-04 DIAGNOSIS — I10 Essential (primary) hypertension: Secondary | ICD-10-CM | POA: Diagnosis not present

## 2019-12-04 DIAGNOSIS — I639 Cerebral infarction, unspecified: Secondary | ICD-10-CM | POA: Diagnosis not present

## 2019-12-04 DIAGNOSIS — Z7901 Long term (current) use of anticoagulants: Secondary | ICD-10-CM | POA: Diagnosis not present

## 2019-12-04 DIAGNOSIS — J45909 Unspecified asthma, uncomplicated: Secondary | ICD-10-CM | POA: Diagnosis present

## 2019-12-04 DIAGNOSIS — I480 Paroxysmal atrial fibrillation: Secondary | ICD-10-CM | POA: Diagnosis not present

## 2019-12-04 DIAGNOSIS — E785 Hyperlipidemia, unspecified: Secondary | ICD-10-CM | POA: Diagnosis present

## 2019-12-04 DIAGNOSIS — M79674 Pain in right toe(s): Secondary | ICD-10-CM | POA: Diagnosis not present

## 2019-12-04 DIAGNOSIS — I5031 Acute diastolic (congestive) heart failure: Secondary | ICD-10-CM | POA: Diagnosis not present

## 2019-12-04 DIAGNOSIS — Z800 Family history of malignant neoplasm of digestive organs: Secondary | ICD-10-CM | POA: Diagnosis not present

## 2019-12-04 DIAGNOSIS — R471 Dysarthria and anarthria: Secondary | ICD-10-CM | POA: Diagnosis not present

## 2019-12-04 DIAGNOSIS — N3281 Overactive bladder: Secondary | ICD-10-CM | POA: Diagnosis present

## 2019-12-04 DIAGNOSIS — K5900 Constipation, unspecified: Secondary | ICD-10-CM | POA: Diagnosis present

## 2019-12-04 DIAGNOSIS — I6310 Cerebral infarction due to embolism of unspecified precerebral artery: Secondary | ICD-10-CM | POA: Diagnosis not present

## 2019-12-04 DIAGNOSIS — D62 Acute posthemorrhagic anemia: Secondary | ICD-10-CM | POA: Diagnosis not present

## 2019-12-04 DIAGNOSIS — R4781 Slurred speech: Secondary | ICD-10-CM | POA: Diagnosis present

## 2019-12-04 LAB — PROTIME-INR
INR: 1.2 (ref 0.8–1.2)
Prothrombin Time: 15.3 seconds — ABNORMAL HIGH (ref 11.4–15.2)

## 2019-12-04 LAB — GLUCOSE, CAPILLARY
Glucose-Capillary: 85 mg/dL (ref 70–99)
Glucose-Capillary: 111 mg/dL — ABNORMAL HIGH (ref 70–99)
Glucose-Capillary: 161 mg/dL — ABNORMAL HIGH (ref 70–99)
Glucose-Capillary: 109 mg/dL — ABNORMAL HIGH (ref 70–99)

## 2019-12-04 LAB — HEMOGLOBIN A1C
Hgb A1c MFr Bld: 7.6 % — ABNORMAL HIGH (ref 4.8–5.6)
Mean Plasma Glucose: 171.42 mg/dL

## 2019-12-04 LAB — LIPID PANEL
Cholesterol: 96 mg/dL (ref 0–200)
HDL: 32 mg/dL — ABNORMAL LOW (ref >40)
LDL Cholesterol: 52 mg/dL (ref 0–99)
Total CHOL/HDL Ratio: 3 RATIO
Triglycerides: 59 mg/dL (ref <150)
VLDL: 12 mg/dL (ref 0–40)

## 2019-12-04 LAB — ECHOCARDIOGRAM COMPLETE
Height: 74 in
Weight: 4359.82 oz

## 2019-12-04 LAB — SARS CORONAVIRUS 2 (TAT 6-24 HRS): SARS Coronavirus 2: NEGATIVE

## 2019-12-04 MED ORDER — ATORVASTATIN CALCIUM 80 MG PO TABS
80.0000 mg | ORAL_TABLET | Freq: Every day | ORAL | Status: DC
  Administered 2019-12-04 – 2019-12-10 (×7): 80 mg via ORAL
  Filled 2019-12-04 (×8): qty 1

## 2019-12-04 MED ORDER — APIXABAN 5 MG PO TABS
5.0000 mg | ORAL_TABLET | Freq: Two times a day (BID) | ORAL | Status: DC
  Administered 2019-12-04 – 2019-12-10 (×13): 5 mg via ORAL
  Filled 2019-12-04 (×13): qty 1

## 2019-12-04 MED ORDER — INSULIN ASPART PROT & ASPART (70-30 MIX) 100 UNIT/ML ~~LOC~~ SUSP
30.0000 [IU] | Freq: Two times a day (BID) | SUBCUTANEOUS | Status: DC
  Filled 2019-12-04: qty 10

## 2019-12-04 MED ORDER — PANTOPRAZOLE SODIUM 40 MG PO TBEC
40.0000 mg | DELAYED_RELEASE_TABLET | Freq: Every day | ORAL | Status: DC
  Administered 2019-12-08 – 2019-12-10 (×3): 40 mg via ORAL
  Filled 2019-12-04 (×7): qty 1

## 2019-12-04 MED ORDER — FUROSEMIDE 40 MG PO TABS
40.0000 mg | ORAL_TABLET | Freq: Every day | ORAL | Status: DC
  Administered 2019-12-05 – 2019-12-07 (×3): 40 mg via ORAL
  Filled 2019-12-04 (×3): qty 1

## 2019-12-04 MED ORDER — SERTRALINE HCL 100 MG PO TABS
100.0000 mg | ORAL_TABLET | Freq: Every day | ORAL | Status: DC
  Administered 2019-12-04 – 2019-12-10 (×7): 100 mg via ORAL
  Filled 2019-12-04 (×7): qty 1

## 2019-12-04 MED ORDER — POLYETHYLENE GLYCOL 3350 17 G PO PACK: 17.0000 g | PACK | Freq: Every day | ORAL | Status: DC | PRN

## 2019-12-04 MED ORDER — ASPIRIN EC 81 MG PO TBEC
81.0000 mg | DELAYED_RELEASE_TABLET | Freq: Every day | ORAL | Status: DC
  Administered 2019-12-04 – 2019-12-10 (×7): 81 mg via ORAL
  Filled 2019-12-04 (×7): qty 1

## 2019-12-04 MED ORDER — ACETAMINOPHEN 160 MG/5ML PO SOLN: 650.0000 mg | ORAL | Status: DC | PRN

## 2019-12-04 MED ORDER — METOPROLOL SUCCINATE ER 50 MG PO TB24
50.0000 mg | ORAL_TABLET | Freq: Every day | ORAL | Status: DC
  Administered 2019-12-04: 50 mg via ORAL
  Filled 2019-12-04: qty 1

## 2019-12-04 MED ORDER — SODIUM CHLORIDE 0.9 % IV SOLN: INTRAVENOUS | Status: DC

## 2019-12-04 MED ORDER — FUROSEMIDE 80 MG PO TABS
80.0000 mg | ORAL_TABLET | Freq: Every day | ORAL | Status: DC
  Administered 2019-12-04: 80 mg via ORAL
  Filled 2019-12-04: qty 1

## 2019-12-04 MED ORDER — INSULIN ASPART 100 UNIT/ML ~~LOC~~ SOLN
0.0000 [IU] | Freq: Three times a day (TID) | SUBCUTANEOUS | Status: DC
  Administered 2019-12-04: 3 [IU] via SUBCUTANEOUS
  Administered 2019-12-08 – 2019-12-09 (×3): 2 [IU] via SUBCUTANEOUS

## 2019-12-04 MED ORDER — ACETAMINOPHEN 650 MG RE SUPP: 650.0000 mg | RECTAL | Status: DC | PRN

## 2019-12-04 MED ORDER — ACETAMINOPHEN 325 MG PO TABS
650.0000 mg | ORAL_TABLET | ORAL | Status: DC | PRN
  Administered 2019-12-05 – 2019-12-09 (×9): 650 mg via ORAL
  Filled 2019-12-04 (×9): qty 2

## 2019-12-04 MED ORDER — ALBUTEROL SULFATE (2.5 MG/3ML) 0.083% IN NEBU: 3.0000 mL | INHALATION_SOLUTION | RESPIRATORY_TRACT | Status: DC | PRN

## 2019-12-04 MED ORDER — AMIODARONE HCL 200 MG PO TABS
200.0000 mg | ORAL_TABLET | Freq: Every day | ORAL | Status: DC
  Administered 2019-12-04 – 2019-12-10 (×7): 200 mg via ORAL
  Filled 2019-12-04 (×7): qty 1

## 2019-12-04 MED ORDER — INSULIN ASPART PROT & ASPART (70-30 MIX) 100 UNIT/ML ~~LOC~~ SUSP
20.0000 [IU] | Freq: Two times a day (BID) | SUBCUTANEOUS | Status: DC
  Administered 2019-12-04 – 2019-12-05 (×2): 20 [IU] via SUBCUTANEOUS
  Filled 2019-12-04: qty 10

## 2019-12-04 MED ORDER — ONDANSETRON HCL 4 MG/2ML IJ SOLN: 4.0000 mg | Freq: Four times a day (QID) | INTRAMUSCULAR | Status: DC | PRN

## 2019-12-04 MED ORDER — STROKE: EARLY STAGES OF RECOVERY BOOK
Freq: Once | Status: AC
  Filled 2019-12-04: qty 1

## 2019-12-04 MED ORDER — FLUTICASONE FUROATE-VILANTEROL 200-25 MCG/INH IN AEPB
1.0000 | INHALATION_SPRAY | Freq: Every day | RESPIRATORY_TRACT | Status: DC | PRN
  Filled 2019-12-04: qty 28

## 2019-12-04 NOTE — Progress Notes (Signed)
*  PRELIMINARY RESULTS* Echocardiogram 2D Echocardiogram has been performed.  Stacey Drain 12/04/2019, 2:13 PM

## 2019-12-04 NOTE — Progress Notes (Signed)
Received Pt from ED, alert and oriented, implemented MD orders, placed on Tele and verified,  Oriented to the room, call light and personal items within reach, will continue to monitor

## 2019-12-04 NOTE — Evaluation (Signed)
Physical Therapy Evaluation Patient Details Name: Brandon Sanchez MRN: [id number] DOB: 06/03/1946 Today's Date: 12/04/2019   History of Present Illness  74 year old male with past medical history of diabetes mellitus type 2, paroxysmal atrial fibrillation, hyperlipidemia, hypertension who presents to [facility] emergency department with left-sided weakness and left facial droop.. 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 Prior to admission the pt was living alone, mobilizing within the home without AD, and navigating stairs without issue. The pt now presents with limitations in functional mobility, power, coordination, and motor control due to above dx, and will continue to benefit from skilled PT to address these deficits. The pt was able to demo bed mobility with modA of 1 to move to sitting EOB and was able to maintain sitting EOB with minG-minA for ~15 min. The pt had multiple episodes of drifting back, but is able to correct with VC. The pt was unable to perform lateral scoots at the EOB, and was unsafe to attempt standing at this time due to deficits in BLE strength and poor core stability. The pt will continue to benefit from skilled PT to improve functional mobility/control as well as transfer capacity.      Follow Up Recommendations SNF;Supervision/Assistance - 24 hour    Equipment Recommendations  (defer to post acute)    Recommendations for Other Services       Precautions / Restrictions Precautions Precautions: Fall Restrictions Weight Bearing Restrictions: No      Mobility  Bed Mobility Overal bed mobility: Needs Assistance Bed Mobility: Rolling;Sidelying to Sit;Sit to Supine Rolling: Mod assist Sidelying to sit: Mod assist   Sit to supine: Max assist   General bed mobility comments: pt able to initiate movement of BLE to EOB with minA to  complete, then modA to raise trunk from elevated HOB with sig use of bed rail. max A to return to bed and reposition  Transfers Overall transfer level: Needs assistance Equipment used: None Transfers: Lateral/Scoot Transfers          Lateral/Scoot Transfers: Mod assist General transfer comment: attempted x3 with use of RUE and BLE as well as mod/maxA using bed pad to scloot laterally to [facility], pt unable to generate hip clearance/mobiltiy. Will need +2 and Stedy to progress sit-stand  Ambulation/Gait Ambulation/Gait assistance: (pt unable)              Stairs            Wheelchair Mobility    Modified Rankin (Stroke Patients Only) Modified Rankin (Stroke Patients Only) Pre-Morbid Rankin Score: Moderate disability Modified Rankin: Severe disability     Balance Overall balance assessment: Needs assistance Sitting-balance support: Bilateral upper extremity supported;Feet supported Sitting balance-Leahy Scale: Fair Sitting balance - Comments: frequent drift back/left, able to correct with VC Postural control: Posterior lean;Left lateral lean                                   Pertinent Vitals/Pain Pain Assessment: No/denies pain    Home Living Family/patient expects to be discharged to:: Private residence Living Arrangements: Alone Available Help at Discharge: Family;Other (Comment) Type of Home: House Home Access: Stairs to enter Entrance Stairs-Rails: Left Entrance Stairs-Number of Steps: 10 from parking spot to door Home Layout: Two level;Bed/bath upstairs;1/2 bath on main level Home Equipment: Cane - single point  Prior Function Level of Independence: Needs assistance   Gait / Transfers Assistance Needed: pt navigating stairs with slow step-to gait pattern with L foot leading with use of rails. reports no use of AD at home, use of driver and then [facility] for Dr visits  ADL's / Homemaking Assistance Needed: pt reports home care service that  does general home maintenance and cleaning, pt reports he does his own cooking adn ADL        Hand Dominance   Dominant Hand: Right    Extremity/Trunk Assessment   Upper Extremity Assessment Upper Extremity Assessment: Defer to OT evaluation LUE Deficits / Details: unable to lift off bed or move up abdomen toward mouth; gross grasp adn slow release lacking @ 30 degrees full extension. unable to use as functional assist LUE Sensation: decreased light touch LUE Coordination: decreased fine motor;decreased gross motor    Lower Extremity Assessment Lower Extremity Assessment: LLE deficits/detail;RLE deficits/detail RLE Deficits / Details: generally 4-/5, especially in knee and hip strength.pt with poor functional use for mobility in bed LLE Deficits / Details: generally 3-/5, able to initiate all movements through partial ROM LLE Sensation: decreased light touch LLE Coordination: decreased fine motor;decreased gross motor    Cervical / Trunk Assessment Cervical / Trunk Assessment: Other exceptions Cervical / Trunk Exceptions: L bias  Communication   Communication: No difficulties  Cognition Arousal/Alertness: Awake/alert Behavior During Therapy: Anxious Overall Cognitive Status: Impaired/Different from baseline Area of Impairment: Attention;Safety/judgement;Awareness;Problem solving                   Current Attention Level: Selective     Safety/Judgement: Decreased awareness of safety;Decreased awareness of deficits Awareness: Emergent Problem Solving: Slow processing General Comments: Pt cooperative, with slow, tangential speech after each question. Pt with successful initiation of movement through partial ROM, but often reporting that he is unable to complete movement/task.      General Comments      Exercises     Assessment/Plan    PT Assessment Patient needs continued PT services  PT Problem List Decreased strength;Decreased mobility;Decreased safety  awareness;Decreased range of motion;Decreased coordination;Decreased activity tolerance;Decreased balance;Decreased knowledge of use of DME;Impaired sensation;Decreased knowledge of precautions;Obesity       PT Treatment Interventions DME instruction;Gait training;Therapeutic exercise;Balance training;Stair training;Neuromuscular re-education;Functional mobility training;Therapeutic activities;Cognitive remediation;Patient/family education    PT Goals (Current goals can be found in the Care Plan section)  Acute Rehab PT Goals Patient Stated Goal: to get a condom catheter PT Goal Formulation: With patient Time For Goal Achievement: 12/18/19 Potential to Achieve Goals: Good    Frequency Min 4X/week   Barriers to discharge Decreased caregiver support;Inaccessible home environment pt with 10 steps to enter and 17 steps to bed/bath    Co-evaluation               AM-PAC PT "6 Clicks" Mobility  Outcome Measure Help needed turning from your back to your side while in a flat bed without using bedrails?: A Little Help needed moving from lying on your back to sitting on the side of a flat bed without using bedrails?: A Lot Help needed moving to and from a bed to a chair (including a wheelchair)?: Total Help needed standing up from a chair using your arms (e.g., wheelchair or bedside chair)?: A Lot Help needed to walk in hospital room?: Total Help needed climbing 3-5 steps with a railing? : Total 6 Click Score: 10    End of Session Equipment Utilized During Treatment: Gait belt  Activity Tolerance: Patient tolerated treatment well Patient left: in bed;with call bell/phone within reach;with bed alarm set Nurse Communication: Mobility status PT Visit Diagnosis: Difficulty in walking, not elsewhere classified (R26.2);Muscle weakness (generalized) (M62.81);Hemiplegia and hemiparesis Hemiplegia - Right/Left: Left Hemiplegia - dominant/non-dominant: Non-dominant Hemiplegia - caused by:  Cerebral infarction    Time: 8295-6213 PT Time Calculation (min) (ACUTE ONLY): 65 min   Charges:   PT Evaluation $PT Eval Moderate Complexity: 1 Mod PT Treatments $Therapeutic Exercise: 8-22 mins $Therapeutic Activity: 23-37 mins        Rolm Baptise, PT, DPT   Acute Rehabilitation Department Pager #: [phone]   Gaetana Michaelis 12/04/2019, 3:00 PM

## 2019-12-04 NOTE — Progress Notes (Signed)
STROKE TEAM PROGRESS NOTE   INTERVAL HISTORY No family is at the bedside.  Patient sitting in bed, having lunch.  Awake alert, still has left facial droop and left hemiparesis.  MRI showed right BG/CR scattered infarcts.  CTA also showed right ICA mixed plaque.  Stroke this time likely large vessel disease.  We will add a baby aspirin onto Eliquis.   OBJECTIVE Vitals:   12/04/19 0359 12/04/19 0400 12/04/19 0600 12/04/19 0756  BP: (!) 125/59  (!) 104/57 104/79  Pulse: 69  67 70  Resp: 18  17 18   Temp: 98.1 F (36.7 C)  97.6 F (36.4 C) 97.6 F (36.4 C)  TempSrc: Oral  Oral Oral  SpO2: 96%  94% 93%  Weight:  123.6 kg    Height:        CBC:  Recent Labs  Lab 12/03/19 2109 12/03/19 2112  WBC  --  13.8*  NEUTROABS  --  8.2*  HGB 12.2* 10.9*  HCT 36.0* 36.3*  MCV  --  89.4  PLT  --  419    Basic Metabolic Panel:  Recent Labs  Lab 12/03/19 2109 12/03/19 2112  NA 142 137  K 3.9 3.9  CL 100 97*  CO2  --  25  GLUCOSE 74 74  BUN 30* 27*  CREATININE 1.30* 1.44*  CALCIUM  --  9.1    Lipid Panel:     Component Value Date/Time   CHOL 96 12/04/2019 0503   TRIG 59 12/04/2019 0503   HDL 32 (L) 12/04/2019 0503   CHOLHDL 3.0 12/04/2019 0503   VLDL 12 12/04/2019 0503   LDLCALC 52 12/04/2019 0503   HgbA1c:  Lab Results  Component Value Date   HGBA1C 7.6 (H) 12/04/2019   Urine Drug Screen: No results found for: LABOPIA, COCAINSCRNUR, LABBENZ, AMPHETMU, THCU, LABBARB  Alcohol Level No results found for: Freer Code Stroke CTA Head W/WO contrast CT Code Stroke CTA Neck W/WO contrast 12/03/2019 IMPRESSION:  1. No emergent large vessel occlusion  2. Atherosclerotic changes in the distal common carotid arteries and at the carotid bifurcations bilaterally significant stenosis.  3. Aortic Atherosclerosis (ICD10-I70.0).  4. Degenerative changes of the cervical spine including fusion at C3-4 and C6-7.  CT HEAD CODE STROKE WO CONTRAST 12/03/2019 IMPRESSION:   1. No acute intracranial abnormality.  2. Mild generalized atrophy and white matter disease. This likely reflects the sequela of chronic microvascular ischemia.  3. ASPECTS is 10/10.  4. Mild sinus disease.   MRI / MRA Head 1. 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 2. Mild chronic small vessel ischemic disease. 3. Negative head MRA.  Transthoracic Echocardiogram  1. Left ventricular ejection fraction, by estimation, is 55 to 60%. The  left ventricle has normal function. The left ventricle has no regional  wall motion abnormalities. The left ventricular internal cavity size was  mildly dilated. There is mild  concentric left ventricular hypertrophy. Left ventricular diastolic  parameters are consistent with Grade III diastolic dysfunction  (restrictive). Elevated left ventricular end-diastolic pressure.  2. Right ventricular systolic function is normal. The right ventricular  size is mildly enlarged.  3. Left atrial size was mildly dilated.  4. Right atrial size was mild to moderately dilated.  5. The mitral valve is grossly normal. Trivial mitral valve  regurgitation. No evidence of mitral stenosis.  6. The aortic valve is tricuspid. Aortic valve regurgitation is mild to  moderate. Moderate aortic valve stenosis.  7. The inferior vena  cava is dilated in size with >50% respiratory  variability, suggesting right atrial pressure of 8 mmHg.    ECG - SR rate 68 BPM. (See cardiology reading for complete details)   PHYSICAL EXAM  Temp:  [97.3 F (36.3 C)-98.6 F (37 C)] 97.3 F (36.3 C) (04/17 1241) Pulse Rate:  [59-70] 66 (04/17 1442) Resp:  [13-20] 17 (04/17 1241) BP: (100-125)/(51-79) 115/56 (04/17 1241) SpO2:  [92 %-96 %] 95 % (04/17 1442) Weight:  [123.6 kg-125.2 kg] 123.6 kg (04/17 0400)  General - Well nourished, well developed, in no apparent distress.  Ophthalmologic - fundi not visualized due to noncooperation.  Cardiovascular -  Regular rhythm and rate, not in A. fib.  Mental Status -  Level of arousal and orientation to time, place, and person were intact. Language including expression, naming, repetition, comprehension was assessed and found intact.  Mild dysarthria  Cranial Nerves II - XII - II - Visual field intact OU. III, IV, VI - Extraocular movements intact. V - Facial sensation intact bilaterally. VII - left facial mild droop. VIII - Hearing & vestibular intact bilaterally. X - Palate elevates symmetrically.  Mild dysarthria XI - Chin turning & shoulder shrug intact bilaterally. XII - Tongue protrusion intact.  Motor Strength - The patient's strength was normal in right upper and lower extremities, however left upper extremity 3/5 proximal and distal, left lower extremity 3 -/5 proximal and 3/5 distally. Bulk was normal and fasciculations were absent.   Motor Tone - Muscle tone was assessed at the neck and appendages and was normal.  Reflexes - The patient's reflexes were symmetrical in all extremities and he had no pathological reflexes.  Sensory - Light touch, temperature/pinprick were assessed and were symmetrical.    Coordination - The patient had normal movements in the right hand with no ataxia or dysmetria.  Tremor was absent.  Gait and Station - deferred.    ASSESSMENT/PLAN [name] is a 74 y.o. male with history of atrial fibrillation and is on Eliquis, asthma, Htn and DM presents with acute onset of left hemiplegia, left facial droop and dysarthria.  He did not receive IV t-PA due to anticoagulation.  Stroke: right BG/CR small scattered infarcts, likely due to right CCA mixed plaque. Although afib could be potential etiology, but he is on eliquis and stroke location not typical for afib related infarct.   CT Head - No acute intracranial abnormality.   CTA H&N - No emergent large vessel occlusion. Atherosclerotic changes in the distal common carotid arteries and at the  carotid bifurcations bilaterally significant stenosis.   MRI head right BG/CR small infarcts  MRA head - normal  2D Echo EF 55-60%  [employer] Virus 2 - negative  LDL - 52  HgbA1c - 6.5  VTE prophylaxis - Eliquis  Eliquis (apixaban) daily prior to admission, now on aspirin 81 mg daily and Eliquis (apixaban) daily. Continue on discharge  Patient counseled to be compliant with his antithrombotic medications  Ongoing aggressive stroke risk factor management  Therapy recommendations: SNF  Disposition:  Pending  Hypotension with fluctuation of neuro symptoms  Home BP meds: metoprolol, lasix 80  Low end of BP  D/c metoprolol  Decrease lasix to 40 . Head of bed flat . Avoid low BP . Consider orthostatic vital with PT/OT  . Long-term BP goal normotensive  PAF   Currently in sinus  Rate controlled  Metoprolol PTA  Now metoprolol on hold due to low BP  Continue Eliquis  Hyperlipidemia  Home Lipid lowering medication: Lipitor 40 mg daily  LDL 52, goal < 70  Current lipid lowering medication: Lipitor 80 mg daily   Continue statin at discharge  Diabetes  Home diabetic meds: insulin  Current diabetic meds: insulin  HgbA1c 6.5, goal < 7.0  CBG monitoring  SSI  PCP follow up  Other Stroke Risk Factors  Advanced age  Previous ETOH use  Obesity, Body mass index is 34.99 kg/m., recommend weight loss, diet and exercise as appropriate   Other Active Problems  Code status - Full code  Leukocytosis - 13.8 (afebrile)  CKD stage 3a - creatinine - 1.30->1.44  Hospital day # 0  I spent  35 minutes in total face-to-face time with the patient, more than 50% of which was spent in counseling and coordination of care, reviewing test results, images and medication, and discussing the diagnosis of stroke, atrial fibrillation, hypertension with fluctuation neuro symptoms, treatment plan and potential prognosis. This patient's care requiresreview of  multiple databases, neurological assessment, discussion with family, other specialists and medical decision making of high complexity.  Marvel Plan, MD PhD Stroke Neurology 12/04/2019 8:51 PM   To contact Stroke Continuity provider, please refer to [email or web address]. After hours, contact General Neurology

## 2019-12-04 NOTE — Progress Notes (Addendum)
Occupational Therapy Evaluation Patient Details Name: Brandon Sanchez MRN: [id number] DOB: [birth year] Today's Date: 12/04/2019    History of Present Illness 74 year old male with past medical history of diabetes mellitus type 2, paroxysmal atrial fibrillation, hyperlipidemia, hypertension who presents to [facility] emergency department with left-sided weakness and left facial droop.. 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 States he was completing his ADL tasks with "some difficulty. Pt was having difficulty with mobility and negotiating steps due to B knee issues. Pt presents with significant functional decline due to deficits listed below and will benefit from rehab at [facility]. Will follow acutely.     Follow Up Recommendations  SNF;Supervision/Assistance - 24 hour    Equipment Recommendations  Other (comment)(TBA)    Recommendations for Other Services       Precautions / Restrictions Precautions Precautions: Fall      Mobility Bed Mobility Overal bed mobility: Needs Assistance Bed Mobility: Rolling;Sidelying to Sit Rolling: Mod assist Sidelying to sit: Max assist(partial sit then pt returned to supine per pt request )       General bed mobility comments: heavy use of bed rail  Transfers                 General transfer comment: will need +2 and Stedy    Balance Overall balance assessment: Needs assistance   Sitting balance-Leahy Scale: Fair                                     ADL either performed or assessed with clinical judgement   ADL Overall ADL's : Needs assistance/impaired Eating/Feeding: Minimal assistance   Grooming: Minimal assistance;Bed level   Upper Body Bathing: Moderate assistance;Bed level   Lower Body Bathing: Maximal assistance;Bed level   Upper Body Dressing : Maximal assistance;Sitting    Lower Body Dressing: Total assistance;Bed level   Toilet Transfer: (will need lift equipment)   Toileting- Clothing Manipulation and Hygiene: Total assistance Toileting - Clothing Manipulation Details (indicate cue type and reason): declined to try male urinal; only wanting condom cath     Functional mobility during ADLs: (will need +2)       Vision Baseline Vision/History: Wears glasses Additional Comments: will further assess     Perception     Praxis Praxis Praxis-Other Comments: sensorimotor deficits with RUE    Pertinent Vitals/Pain Pain Assessment: No/denies pain     Hand Dominance Right   Extremity/Trunk Assessment Upper Extremity Assessment Upper Extremity Assessment: LUE deficits/detail LUE Deficits / Details: unable to lift off bed or move up abdomen toward mouth; gross grasp adn slow release lacking @ 30 degrees full extension. unable to use as functional assist LUE Sensation: decreased light touch LUE Coordination: decreased fine motor;decreased gross motor   Lower Extremity Assessment Lower Extremity Assessment: Defer to PT evaluation   Cervical / Trunk Assessment Cervical / Trunk Assessment: Other exceptions Cervical / Trunk Exceptions: L bias   Communication Communication Communication: No difficulties   Cognition Arousal/Alertness: Awake/alert Behavior During Therapy: Anxious Overall Cognitive Status: Impaired/Different from baseline Area of Impairment: Attention;Safety/judgement;Awareness;Problem solving                   Current Attention Level: Selective     Safety/Judgement: Decreased awareness of safety;Decreased awareness of deficits Awareness: Emergent Problem Solving: Slow processing General  Comments: adamant of needing condom cath; offered urinal to use however pt would not move further until he had a condom cath; attempted to reson by putting a different type of urinal (male) to use as he did not use a condom cath PTA    General Comments       Exercises     Shoulder Instructions      Home Living Family/patient expects to be discharged to:: Private residence Living Arrangements: Alone Available Help at Discharge: Family;Other (Comment) Type of Home: House Home Access: Stairs to enter [employer] of Steps: 10 from parking spot to door Entrance Stairs-Rails: Left Home Layout: Two level;Bed/bath upstairs;1/2 bath on main level Alternate Level Stairs-Number of Steps: 17 with landing Alternate Level Stairs-Rails: Right Bathroom Shower/Tub: Tub/shower unit;Walk-in shower   Bathroom Toilet: Handicapped height Bathroom Accessibility: Yes How Accessible: Accessible via walker Home Equipment: Cane - single point          Prior Functioning/Environment Level of Independence: Needs assistance  Gait / Transfers Assistance Needed: pt navigating stairs with slow step-to gait pattern with L foot leading with use of rails. reports no use of AD at home, use of driver and then [facility] for Dr visits ADL's / Homemaking Assistance Needed: pt reports home care service that does general home maintenance and cleaning, pt reports he does his own cooking adn ADL            OT Problem List: Decreased strength;Decreased range of motion;Decreased activity tolerance;Impaired balance (sitting and/or standing);Decreased coordination;Decreased cognition;Decreased safety awareness;Decreased knowledge of use of DME or AE;Impaired sensation;Impaired tone;Obesity;Impaired UE functional use;Impaired vision/perception;Pain      OT Treatment/Interventions: Self-care/ADL training;Therapeutic exercise;Neuromuscular education;Energy conservation;DME and/or AE instruction;Therapeutic activities;Cognitive remediation/compensation;Patient/family education;Balance training;Visual/perceptual remediation/compensation    OT Goals(Current goals can be found in the care plan section) Acute Rehab OT Goals Patient Stated Goal: to get a  condom catheter OT Goal Formulation: With patient Time For Goal Achievement: 12/18/19 Potential to Achieve Goals: Good  OT Frequency: Min 2X/week   Barriers to D/C:            Co-evaluation              AM-PAC OT "6 Clicks" Daily Activity     Outcome Measure Help from another person eating meals?: A Little Help from another person taking care of personal grooming?: A Lot Help from another person toileting, which includes using toliet, bedpan, or urinal?: Total Help from another person bathing (including washing, rinsing, drying)?: A Lot Help from another person to put on and taking off regular upper body clothing?: A Lot Help from another person to put on and taking off regular lower body clothing?: Total 6 Click Score: 11   End of Session Nurse Communication: Mobility status;Need for lift equipment(Stedy)  Activity Tolerance: Patient limited by fatigue Patient left: in bed;with call bell/phone within reach;with nursing/sitter in room  OT Visit Diagnosis: Unsteadiness on feet (R26.81);Other abnormalities of gait and mobility (R26.89);Muscle weakness (generalized) (M62.81);Other symptoms and signs involving cognitive function;Hemiplegia and hemiparesis;Pain Hemiplegia - Right/Left: Left Hemiplegia - dominant/non-dominant: Non-Dominant Hemiplegia - caused by: Cerebral infarction                Time: 5366-4403 OT Time Calculation (min): 17 min Charges:  OT General Charges $OT Visit: 1 Visit OT Evaluation $OT Eval Moderate Complexity: [place], OT/L   Acute OT Clinical Specialist Acute Rehabilitation Services Pager [phone] Office [phone]   [facility] 12/04/2019, 12:49 PM

## 2019-12-04 NOTE — Progress Notes (Signed)
PROGRESS NOTE    Brandon Sanchez  ERX:[id number] DOB: May 07, 1946 DOA: 12/03/2019 PCP: Deland Pretty, MD   Brief Narrative:  74 year old male with past medical history of diabetes mellitus type 2, paroxysmal atrial fibrillation, hyperlipidemia, hypertension who presents to [facility] emergency department with left-sided weakness and left facial droop.  Patient explains that at approximately 8:00 this morning began to notice that he was having difficulty with ambulation.  Patient did not pay much mind.  As the day progressed, he continued to have difficulty with ambulation.  By the early afternoon, the patient noticed that he began to develop severe weakness of the left side and difficulty with moving his left lower extremity in any capacity.  More, he felt that his left face was "tingling" and that "I could not speak right."  Patient denies any associated chest pain, palpitations, lightheadedness, confusion, changes in vision.  At 6 PM, the patient was concerned that he may be having a stroke and therefore contacted family who then contacted 911.  Patient was brought into [facility] emergency department via EMS and was deemed a code stroke on arrival.  Shortly after arrival to the emergency department, patient was quickly valuated by neurology.  Furthermore, patient underwent initial imaging of the brain including noncontrast CT imaging as well as CT angiogram of the head and neck.  Due to timeline of symptoms and patient already being on Eliquis,  TPA was not administered.  During this initial evaluation, patient's left-sided weakness began to rapidly improve spontaneously with neurology suspecting that the patient symptoms are more likely secondary to TIA.  The hospitalist group was then called to assess patient for mission to the hospital.  Assessment & Plan:   Principal Problem:   Acute left-sided weakness Active Problems:   Uncontrolled type 2 diabetes mellitus with  hypoglycemia without coma, with long-term current use of insulin (HCC)   Essential hypertension   Paroxysmal atrial fibrillation (HCC)   Mixed diabetic hyperlipidemia associated with type 2 diabetes mellitus ([place])   Cerebral embolism with cerebral infarction  Acute left-sided weakness   Gradual development of left-sided weakness throughout the day with associated facial droop and mild dysarthria with concerns for stroke versus TIA.  With patient's rapidly resolving symptoms upon arrival to the emergency department, TIA is more likely.  ABCD score of 6 suggesting that this a high risk TIA  Continuing home regimen of anticoagulation with Eliquis  Increasing home regimen of statin therapy to atorvastatin 80 mg daily  Close monitoring of blood pressure  Frequent neurologic checks  Placing patient on telemetry  PT/OT and speech therapy evaluations  Dr. Cheral Marker with neurology has already evaluated the patient and neurology will continue to follow.  Neurology recommends obtaining MRI brain, MRA brain and echocardiography  MRI: Small acute posterior right basal ganglia/corona radiata infarct.    Uncontrolled type 2 diabetes mellitus with hypoglycemia without coma, with long-term current use of insulin ([place])   Patient was initially hypoglycemic on arrival which has responded to administration of dextrose  While hypoglycemia can sometimes mimic strokelike symptoms patient states that his blood sugar was normal at home and therefore I do not believe that this is the case here.  Placing patient on Accu-Cheks before every meal and nightly with sliding scale insulin  Obtaining hemoglobin A1c  Continue home regimen of NovoLog 70/30  monitoring for additional episodes of hypoglycemia.    Essential hypertension   Continue home regimen of antihypertensives including amlodipine and metoprolol  If MRI brain  does indeed reveal an acute stroke will discontinue these medications  and allow for permissive hypertension.    Paroxysmal atrial fibrillation (HCC)   Continuing home regimen of Eliquis  Continue home regimen of amiodarone  Patient reports that he is to undergo cardiac ablation with electrophysiology in several weeks  Monitoring patient on telemetry  Patient is currently normal sinus rhythm    Mixed diabetic hyperlipidemia associated with type 2 diabetes mellitus ([place])   Increasing statin regimen to atorvastatin 80 mg daily for risk reduction  Obtaining lipid panel    Code Status:  Full code Family Communication: Daughter updated via telephone Disposition Plan: Patient is anticipated to be discharged to home with home health services once patient has met maximum benefit from current hospitalization.   Consults called: Dr. Cheral Marker with neurology has already evaluated the patient in the emergency department.  Neurology to continue to follow. Admission status: Patient will be admitted to Observation and is anticipated to remain in the hospital for less than 2 midnights.  Status is: INPATIENT  The patient remains OBS appropriate and will d/c before 2 midnights.  Dispo: The patient is from: Home  Anticipated d/c is to: Home  Anticipated d/c date is: 2 days  Patient currently is not medically stable to d/c.    Consultants:   Neurology.  Procedures:  Antimicrobials:  Anti-infectives (From admission, onward)   None     Subjective: Patient was seen and examined at bedside.  He still reports having numbness on the left side associated with weakness in the left leg.  Objective: Vitals:   12/04/19 0400 12/04/19 0600 12/04/19 0756 12/04/19 1000  BP:  (!) 104/57 104/79 111/62  Pulse:  67 70 62  Resp:  17 18 18   Temp:  97.6 F (36.4 C) 97.6 F (36.4 C) 98.2 F (36.8 C)  TempSrc:  Oral Oral Oral  SpO2:  94% 93% 95%  Weight: 123.6 kg     Height:        Intake/Output Summary (Last 24 hours)  at 12/04/2019 1215 Last data filed at 12/04/2019 0655 Gross per 24 hour  Intake 416.55 ml  Output 500 ml  Net -83.45 ml   Filed Weights   12/03/19 2142 12/04/19 0400  Weight: 125.2 kg 123.6 kg    Examination:  General exam: Appears calm and comfortable  Respiratory system: Clear to auscultation. Respiratory effort normal. Cardiovascular system: S1 & S2 heard, RRR. No JVD, murmurs, rubs, gallops or clicks. No pedal edema. Gastrointestinal system: Abdomen is nondistended, soft and nontender. No organomegaly or masses felt. Normal bowel sounds heard. Central nervous system: Alert and oriented. No focal neurological deficits. Extremities: Symmetric 5 x 5 power. Skin: No rashes, lesions or ulcers Psychiatry: Judgement and insight appear normal. Mood & affect appropriate.     Data Reviewed: I have personally reviewed following labs and imaging studies  CBC: Recent Labs  Lab 12/03/19 2109 12/03/19 2112  WBC  --  13.8*  NEUTROABS  --  8.2*  HGB 12.2* 10.9*  HCT 36.0* 36.3*  MCV  --  89.4  PLT  --  838   Basic Metabolic Panel: Recent Labs  Lab 12/03/19 2109 12/03/19 2112  NA 142 137  K 3.9 3.9  CL 100 97*  CO2  --  25  GLUCOSE 74 74  BUN 30* 27*  CREATININE 1.30* 1.44*  CALCIUM  --  9.1   GFR: Estimated Creatinine Clearance: 64.8 mL/min (A) (by C-G formula based on SCr of 1.44 mg/dL (H)).  Liver Function Tests: Recent Labs  Lab 12/03/19 2112  AST 41  ALT 39  ALKPHOS 78  BILITOT 0.5  PROT 7.2  ALBUMIN 2.8*   No results for input(s): LIPASE, AMYLASE in the last 168 hours. No results for input(s): AMMONIA in the last 168 hours. Coagulation Profile: Recent Labs  Lab 12/03/19 2150  INR 1.2   Cardiac Enzymes: No results for input(s): CKTOTAL, CKMB, CKMBINDEX, TROPONINI in the last 168 hours. BNP (last 3 results) No results for input(s): PROBNP in the last 8760 hours. HbA1C: Recent Labs    12/04/19 0503  HGBA1C 7.6*   CBG: Recent Labs  Lab  12/03/19 2102 12/03/19 2207 12/03/19 2259 12/04/19 0618 12/04/19 1142  GLUCAP 63* 54* 119* 85 111*   Lipid Profile: Recent Labs    12/04/19 0503  CHOL 96  HDL 32*  LDLCALC 52  TRIG 59  CHOLHDL 3.0   Thyroid Function Tests: No results for input(s): TSH, T4TOTAL, FREET4, T3FREE, THYROIDAB in the last 72 hours. Anemia Panel: No results for input(s): VITAMINB12, FOLATE, FERRITIN, TIBC, IRON, RETICCTPCT in the last 72 hours. Sepsis Labs: No results for input(s): PROCALCITON, LATICACIDVEN in the last 168 hours.  Recent Results (from the past 240 hour(s))  SARS CORONAVIRUS 2 (TAT 6-24 HRS) Nasopharyngeal Nasopharyngeal Swab     Status: None   Collection Time: 12/04/19  1:00 AM   Specimen: Nasopharyngeal Swab  Result Value Ref Range Status   SARS Coronavirus 2 NEGATIVE NEGATIVE Final    Comment: (NOTE) SARS-CoV-2 target nucleic acids are NOT DETECTED. The SARS-CoV-2 RNA is generally detectable in upper and lower respiratory specimens during the acute phase of infection. Negative results do not preclude SARS-CoV-2 infection, do not rule out co-infections with other pathogens, and should not be used as the sole basis for treatment or other patient management decisions. Negative results must be combined with clinical observations, patient history, and epidemiological information. The expected result is Negative. Fact Sheet for Patients: SugarRoll.be Fact Sheet for Healthcare Providers: https://www.woods-mathews.com/ This test is not yet approved or cleared by the [place] FDA and  has been authorized for detection and/or diagnosis of SARS-CoV-2 by FDA under an Emergency Use Authorization (EUA). This EUA will remain  in effect (meaning this test can be used) for the duration of the COVID-19 declaration under Section 56 4(b)(1) of the Act, 21 U.S.C. section 360bbb-3(b)(1), unless the authorization is terminated or revoked  sooner. Performed at [facility], [place] [address], [place],  [address]      Radiology Studies: CT Code Stroke CTA Head W/WO contrast  Result Date: 12/03/2019 CLINICAL DATA:  Left-sided weakness. EXAM: CT ANGIOGRAPHY HEAD AND NECK TECHNIQUE: Multidetector CT imaging of the head and neck was performed using the standard protocol during bolus administration of intravenous contrast. Multiplanar CT image reconstructions and MIPs were obtained to evaluate the vascular anatomy. Carotid stenosis measurements (when applicable) are obtained utilizing NASCET criteria, using the distal internal carotid diameter as the denominator. CONTRAST:  66m OMNIPAQUE IOHEXOL 350 MG/ML SOLN COMPARISON:  CT head without contrast 12/03/2019 FINDINGS: CTA NECK FINDINGS Aortic arch: A 3 vessel arch configuration is present. Atherosclerotic calcifications are present without aneurysm or stenosis. Right carotid system: Atherosclerotic calcifications are present the wall of distal right common carotid artery without significant stenosis. Atherosclerotic calcifications are present at the bifurcation. Mixed density plaque is noted laterally without significant stenosis. Minimal luminal diameter is 2.5 mm. Left carotid system: The left common carotid artery also demonstrates some distal mural  calcifications. Calcifications are present at the bifurcation without significant stenosis. Distal cervical left ICA is normal. Vertebral arteries: The vertebral arteries are codominant. High-grade stenosis is present at the origin of the left vertebral artery. A 50% narrowing is present the origin of the right vertebral artery. No additional stenoses are present in either vertebral artery in the neck. Skeleton: Acquired fusion is present across the disc space at C3-4 fusion is also noted across the disc space at C6-7. Vertebral body heights and alignment are maintained. No focal lytic or blastic lesions are present. Other neck: Soft  tissues of the neck are unremarkable. Upper chest: Prominent left pleural effusion is present. Associated atelectasis is present. Right lung is clear. Subcentimeter mediastinal nodes are likely reactive. Review of the MIP images confirms the above findings CTA HEAD FINDINGS Anterior circulation: Atherosclerotic calcifications are present supraclinoid internal carotid arteries bilaterally without a significant stenosis relative to the ICA termini. The A1 and M1 segments are normal. MCA bifurcations intact. The ACA and MCA branch vessels are within normal limits. Posterior circulation: Left vertebral artery is dominant. PICA origins are visualized and. The vertebrobasilar junction is within normal limits. Basilar artery is. Both posterior cerebral arteries originate basilar tip. A prominent right posterior communicating artery contributes. Venous sinuses: The dural sinuses are patent. The straight sinus deep cerebral veins intact. Cortical veins are unremarkable. Anatomic variants: Prominent right posterior communicating artery. Review of the MIP images confirms the above findings IMPRESSION: 1. No emergent large vessel occlusion 2. Atherosclerotic changes in the distal common carotid arteries and at the carotid bifurcations bilaterally significant stenosis. 3.  Aortic Atherosclerosis (ICD10-I70.0). 4. Degenerative changes of the cervical spine including fusion at C3-4 and C6-7. These results were called by telephone at the time of interpretation on 12/03/2019 at 9:35pm to provider ERIC [facility] , who verbally acknowledged these results. Electronically Signed   By: San Morelle M.D.   On: 12/03/2019 21:41   CT Code Stroke CTA Neck W/WO contrast  Result Date: 12/03/2019 CLINICAL DATA:  Left-sided weakness. EXAM: CT ANGIOGRAPHY HEAD AND NECK TECHNIQUE: Multidetector CT imaging of the head and neck was performed using the standard protocol during bolus administration of intravenous contrast. Multiplanar CT  image reconstructions and MIPs were obtained to evaluate the vascular anatomy. Carotid stenosis measurements (when applicable) are obtained utilizing NASCET criteria, using the distal internal carotid diameter as the denominator. CONTRAST:  48m OMNIPAQUE IOHEXOL 350 MG/ML SOLN COMPARISON:  CT head without contrast 12/03/2019 FINDINGS: CTA NECK FINDINGS Aortic arch: A 3 vessel arch configuration is present. Atherosclerotic calcifications are present without aneurysm or stenosis. Right carotid system: Atherosclerotic calcifications are present the wall of distal right common carotid artery without significant stenosis. Atherosclerotic calcifications are present at the bifurcation. Mixed density plaque is noted laterally without significant stenosis. Minimal luminal diameter is 2.5 mm. Left carotid system: The left common carotid artery also demonstrates some distal mural calcifications. Calcifications are present at the bifurcation without significant stenosis. Distal cervical left ICA is normal. Vertebral arteries: The vertebral arteries are codominant. High-grade stenosis is present at the origin of the left vertebral artery. A 50% narrowing is present the origin of the right vertebral artery. No additional stenoses are present in either vertebral artery in the neck. Skeleton: Acquired fusion is present across the disc space at C3-4 fusion is also noted across the disc space at C6-7. Vertebral body heights and alignment are maintained. No focal lytic or blastic lesions are present. Other neck: Soft tissues of the neck are  unremarkable. Upper chest: Prominent left pleural effusion is present. Associated atelectasis is present. Right lung is clear. Subcentimeter mediastinal nodes are likely reactive. Review of the MIP images confirms the above findings CTA HEAD FINDINGS Anterior circulation: Atherosclerotic calcifications are present supraclinoid internal carotid arteries bilaterally without a significant stenosis  relative to the ICA termini. The A1 and M1 segments are normal. MCA bifurcations intact. The ACA and MCA branch vessels are within normal limits. Posterior circulation: Left vertebral artery is dominant. PICA origins are visualized and. The vertebrobasilar junction is within normal limits. Basilar artery is. Both posterior cerebral arteries originate basilar tip. A prominent right posterior communicating artery contributes. Venous sinuses: The dural sinuses are patent. The straight sinus deep cerebral veins intact. Cortical veins are unremarkable. Anatomic variants: Prominent right posterior communicating artery. Review of the MIP images confirms the above findings IMPRESSION: 1. No emergent large vessel occlusion 2. Atherosclerotic changes in the distal common carotid arteries and at the carotid bifurcations bilaterally significant stenosis. 3.  Aortic Atherosclerosis (ICD10-I70.0). 4. Degenerative changes of the cervical spine including fusion at C3-4 and C6-7. These results were called by telephone at the time of interpretation on 12/03/2019 at 9:35pm to provider ERIC [employer] , who verbally acknowledged these results. Electronically Signed   By: San Morelle M.D.   On: 12/03/2019 21:41   MR ANGIO HEAD WO CONTRAST  Result Date: 12/04/2019 CLINICAL DATA:  TIA. Left-sided weakness and facial droop. EXAM: MRI HEAD WITHOUT CONTRAST MRA HEAD WITHOUT CONTRAST TECHNIQUE: Multiplanar, multiecho pulse sequences of the brain and surrounding structures were obtained without intravenous contrast. Angiographic images of the head were obtained using MRA technique without contrast. COMPARISON:  Head CT and CTA 12/03/2019 FINDINGS: MRI HEAD FINDINGS Brain: There is a small acute infarct involving the right corona radiata and posterior right lentiform nucleus. T2 hyperintensities in the cerebral white matter bilaterally are nonspecific but compatible with mild chronic small vessel ischemic disease. Mild cerebral atrophy  is within normal limits for age. No intracranial hemorrhage, mass, midline shift, or extra-axial fluid collection is identified. Vascular: Major intracranial vascular flow voids are preserved. Skull and upper cervical spine: Decreased bone marrow T1 signal intensity in the skull and included upper cervical spine, nonspecific but may be related to the patient's known anemia. Sinuses/Orbits: Unremarkable orbits. Mild mucosal thickening in the paranasal sinuses. Trace left mastoid fluid. Other: None. MRA HEAD FINDINGS The visualized distal vertebral arteries are patent to the basilar with a fenestration partially visualized in the right vertebral artery, a normal variant. The left PICA and right AICA appear dominant. Patent SCAs are seen bilaterally. The basilar artery is widely patent. There is a moderately large right posterior communicating artery. Both PCAs are patent without evidence of significant proximal stenosis. The internal carotid arteries are widely patent from skull base to carotid termini. ACAs and MCAs are patent without evidence of proximal branch occlusion or significant proximal stenosis. No aneurysm is identified. IMPRESSION: 1. 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 2. Mild chronic small vessel ischemic disease. 3. Negative head MRA. Electronically Signed   By: Logan Bores M.D.   On: 12/04/2019 09:37   MR BRAIN WO CONTRAST  Result Date: 12/04/2019 CLINICAL DATA:  TIA. Left-sided weakness and facial droop. EXAM: MRI HEAD WITHOUT CONTRAST MRA HEAD WITHOUT CONTRAST TECHNIQUE: Multiplanar, multiecho pulse sequences of the brain and surrounding structures were obtained without intravenous contrast. Angiographic images of the head were obtained using MRA technique without contrast. COMPARISON:  Head CT and CTA 12/03/2019  FINDINGS: MRI HEAD FINDINGS Brain: There is a small acute infarct involving the right corona radiata and posterior right lentiform nucleus. T2  hyperintensities in the cerebral white matter bilaterally are nonspecific but compatible with mild chronic small vessel ischemic disease. Mild cerebral atrophy is within normal limits for age. No intracranial hemorrhage, mass, midline shift, or extra-axial fluid collection is identified. Vascular: Major intracranial vascular flow voids are preserved. Skull and upper cervical spine: Decreased bone marrow T1 signal intensity in the skull and included upper cervical spine, nonspecific but may be related to the patient's known anemia. Sinuses/Orbits: Unremarkable orbits. Mild mucosal thickening in the paranasal sinuses. Trace left mastoid fluid. Other: None. MRA HEAD FINDINGS The visualized distal vertebral arteries are patent to the basilar with a fenestration partially visualized in the right vertebral artery, a normal variant. The left PICA and right AICA appear dominant. Patent SCAs are seen bilaterally. The basilar artery is widely patent. There is a moderately large right posterior communicating artery. Both PCAs are patent without evidence of significant proximal stenosis. The internal carotid arteries are widely patent from skull base to carotid termini. ACAs and MCAs are patent without evidence of proximal branch occlusion or significant proximal stenosis. No aneurysm is identified. IMPRESSION: 1. 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 2. Mild chronic small vessel ischemic disease. 3. Negative head MRA. Electronically Signed   By: Logan Bores M.D.   On: 12/04/2019 09:37   CT HEAD CODE STROKE WO CONTRAST  Result Date: 12/03/2019 CLINICAL DATA:  Code stroke.  Facial droop and left-sided weakness. EXAM: CT HEAD WITHOUT CONTRAST TECHNIQUE: Contiguous axial images were obtained from the base of the skull through the vertex without intravenous contrast. COMPARISON:  None. FINDINGS: Brain: Mild generalized atrophy and white matter disease is present. No acute infarct, hemorrhage, or  mass lesion is present. Basal ganglia are intact. Insular ribbon is normal bilaterally. No acute or focal cortical abnormality is present. The ventricles are of normal size. No significant extraaxial fluid collection is present. The brainstem and cerebellum are within normal limits. Vascular: Atherosclerotic changes are present within the cavernous internal carotid arteries. No hyperdense vessel is present. Skull: Calvarium is intact. No focal lytic or blastic lesions are present. No significant extracranial soft tissue lesion is present. Sinuses/Orbits: Mucosal scratched at circumferential mucosal thickening is present in the maxillary sinuses, left greater than right. No fluid levels are present. Posterior left ethmoid air cells opacified. Mucosal thickening is present in the inferior frontal sinuses bilaterally. Mastoid air cells are clear. Globes and orbits are within normal limits. ASPECTS [facility] Stroke Program Early CT Score) - Ganglionic level infarction (caudate, lentiform nuclei, internal capsule, insula, M1-M3 cortex): 7/7 - Supraganglionic infarction (M4-M6 cortex): 3/3 Total score (0-10 with 10 being normal): 10/10 IMPRESSION: 1. No acute intracranial abnormality. 2. Mild generalized atrophy and white matter disease. This likely reflects the sequela of chronic microvascular ischemia. 3. ASPECTS is 10/10. 4. Mild sinus disease. The above was relayed via text pager to Dr. Cheral Marker on 12/03/2019 at 21:20 . Electronically Signed   By: San Morelle M.D.   On: 12/03/2019 21:20     Scheduled Meds: . amiodarone  200 mg Oral Daily  . apixaban  5 mg Oral BID  . aspirin EC  81 mg Oral Daily  . atorvastatin  80 mg Oral Daily  . furosemide  80 mg Oral Daily  . insulin aspart  0-15 Units Subcutaneous TID AC & HS  . insulin aspart protamine- aspart  20  Units Subcutaneous BID AC & HS  . metoprolol succinate  50 mg Oral Daily  . pantoprazole  40 mg Oral Daily  . sertraline  100 mg Oral Daily    Continuous Infusions: . sodium chloride 125 mL/hr at 12/04/19 0655     LOS: 0 days    Time spent: 25 mins.    Shawna Clamp, MD [facility]   If 7PM-7AM, please contact night-coverage

## 2019-12-04 NOTE — H&P (Signed)
History and Physical    Brandon Sanchez RFF:[id number] DOB: 25-Aug-1945 DOA: 12/03/2019  PCP: Deland Pretty, MD  Patient coming from: home via EMS   Chief Complaint:   Left sided weakness  HPI:  74 year old male with past medical history of diabetes mellitus type 2, paroxysmal atrial fibrillation, hyperlipidemia, hypertension who presents to [facility] emergency department with left-sided weakness and left facial droop.  Patient explains that at approximately 8:00 this morning began to notice that he was having difficulty with ambulation.  Patient did not pay much mind.  As the day progressed, he continued to have difficulty with ambulation.  By the early afternoon, the patient noticed that he began to develop severe weakness of the left side and difficulty with moving his left lower extremity in any capacity.  More, he felt that his left face was "tingling" and that "I could not speak right."  Patient denies any associated chest pain, palpitations, lightheadedness, confusion, changes in vision.  At 6 PM, the patient was concerned that he may be having a stroke and therefore contacted family who then contacted 911.  Patient was brought into [facility] emergency department via EMS and was deemed a code stroke on arrival.  Shortly after arrival to the emergency department, patient was quickly valuated by neurology.  Furthermore, patient underwent initial imaging of the brain including noncontrast CT imaging as well as CT angiogram of the head and neck.  Due to timeline of symptoms and patient already being on Eliquis TPA was not administered.  During this initial evaluation, patient's left-sided weakness began to rapidly improve spontaneously with neurology suspecting that the patient symptoms are more likely secondary to TIA.  The hospitalist group was then called to assess patient for mission to the hospital.   Review of Systems: A 10-system review of systems has been performed and  all systems are negative with the exception of what is listed in the HPI.    Past Medical History:  Diagnosis Date  . Allergic rhinitis, cause unspecified   . Asthma   . Other and unspecified hyperlipidemia   . Type II or unspecified type diabetes mellitus without mention of complication, not stated as uncontrolled   . Unspecified essential hypertension     Past Surgical History:  Procedure Laterality Date  . CARDIOVERSION N/A 09/22/2019   Procedure: CARDIOVERSION;  Surgeon: Donato Heinz, MD;  Location: [facility] ENDOSCOPY;  Service: Endoscopy;  Laterality: N/A;  . CARDIOVERSION N/A 10/18/2019   Procedure: CARDIOVERSION;  Surgeon: Jerline Pain, MD;  Location: [facility] ENDOSCOPY;  Service: Cardiovascular;  Laterality: N/A;  . TEE WITHOUT CARDIOVERSION N/A 09/22/2019   Procedure: TRANSESOPHAGEAL ECHOCARDIOGRAM (TEE);  Surgeon: Donato Heinz, MD;  Location: [facility] ENDOSCOPY;  Service: Endoscopy;  Laterality: N/A;     reports that he has never smoked. He has never used smokeless tobacco. He reports previous alcohol use of about 3.0 standard drinks of alcohol per week. He reports that he does not use drugs.  No Known Allergies  Family History  Problem Relation Age of Onset  . Colon cancer Father   . Heart disease Paternal Grandfather      Prior to Admission medications   Medication Sig Start Date End Date Taking? Authorizing Provider  acetaminophen (TYLENOL) 500 MG tablet Take 1,000 mg by mouth every 6 (six) hours as needed for moderate pain or headache.    Yes [provider]  albuterol (PROAIR HFA) 108 (90 BASE) MCG/ACT inhaler 2 puffs every 4 hours as needed  only  if your can't catch your breath Patient taking differently: Inhale 2 puffs into the lungs every 4 (four) hours as needed for wheezing.  05/12/15  Yes Tanda Rockers, MD  amiodarone (PACERONE) 200 MG tablet Take 1 tablet (200 mg total) by mouth daily. 10/21/19  Yes Fenton, Clint R, PA  apixaban (ELIQUIS) 5 MG TABS  tablet Take 1 tablet (5 mg total) by mouth 2 (two) times daily. 10/21/19  Yes Fenton, Clint R, PA  atorvastatin (LIPITOR) 40 MG tablet Take 40 mg by mouth every evening.    Yes [provider]  BREO ELLIPTA 200-25 MCG/INH AEPB Inhale 1 puff into the lungs daily as needed (shortness of breath).  08/24/19  Yes [provider]  furosemide (LASIX) 40 MG tablet Take 2 tablets (80 mg total) by mouth daily. May take additional 40 mg if notice swelling of shortness of breath 11/02/19  Yes Buford Dresser, MD  Glucosamine-Chondroit-Vit C-Mn (GLUCOSAMINE CHONDR 1500 COMPLX) CAPS Take 1 capsule by mouth 2 (two) times daily.   Yes [provider]  insulin aspart protamine- aspart (NOVOLOG MIX 70/30) (70-30) 100 UNIT/ML injection Inject 30 Units into the skin 2 (two) times daily at 8 am and 10 pm.    Yes [provider]  metoprolol succinate (TOPROL-XL) 50 MG 24 hr tablet Take 1 tablet (50 mg total) by mouth daily. Take with or immediately following a meal. 11/09/19  Yes Camnitz, Ocie Doyne, MD  Multiple Vitamin (MULTIVITAMIN WITH MINERALS) TABS tablet Take 1 tablet by mouth daily.   Yes [provider]  omeprazole (PRILOSEC) 40 MG capsule Take 40 mg by mouth daily as needed (heart burn).    Yes [provider]  ONETOUCH VERIO test strip USE AS DIRECTED TO TEST BLOOD SUGAR THREE TIMES DAILY FOR 30 DAYS 07/26/19  Yes [provider]  potassium chloride (KLOR-CON) 10 MEQ tablet Take 10 mEq by mouth at bedtime.   Yes [provider]  PREVIDENT 5000 BOOSTER PLUS 1.1 % PSTE Place 1 application onto teeth in the morning and at bedtime.  04/13/19  Yes [provider]  sertraline (ZOLOFT) 100 MG tablet Take 100 mg by mouth daily.   Yes [provider]    Physical Exam: Vitals:   12/03/19 2142 12/03/19 2200 12/03/19 2300 12/03/19 2315  BP:  116/71 100/63 108/76  Pulse:  66 68 68  Resp:  17 13 20   Temp:      TempSrc:      SpO2:   96% 95% 93%  Weight: 125.2 kg     Height: 6' 2"  (1.88 m)       Constitutional: Acute alert and oriented x3, no associated distress.   Skin: no rashes, no lesions, good skin turgor noted. Eyes: Pupils are equally reactive to light.  No evidence of scleral icterus or conjunctival pallor.  ENMT: Mucous membranes are moist. Posterior pharynx clear of any exudate or lesions. Normal dentition.   Neck: normal, supple, no masses, no thyromegaly Respiratory: clear to auscultation bilaterally, no wheezing , no crackles. Normal respiratory effort. No accessory muscle use.  Cardiovascular: Regular rate and rhythm, no murmurs / rubs / gallops. No extremity edema. 2+ pedal pulses. No carotid bruits.  Back:   Nontender without crepitus or deformity. Abdomen: Abdomen is soft and nontender.  No evidence of intra-abdominal masses.  Positive bowel sounds noted in all quadrants.   Musculoskeletal: No joint deformity upper and lower extremities. Good ROM, no contractures. Normal muscle tone.  Neurologic:  Notable left facial droop still present on examination.  Patient exhibiting 4 out of 5 strength of the left upper and left lower extremities.  Sensation is grossly intact.  Patient is awake alert and oriented x3.  Patient is responsive to both verbal and painful stimuli.  Patient is following all commands.    Psychiatric: Patient presents as a depressed mood with flat affect.  Patient seems to possess insight as to theircurrent situation.     Labs on Admission: I have personally reviewed following labs and imaging studies -   CBC: Recent Labs  Lab 12/03/19 2109 12/03/19 2112  WBC  --  13.8*  NEUTROABS  --  8.2*  HGB 12.2* 10.9*  HCT 36.0* 36.3*  MCV  --  89.4  PLT  --  144   Basic Metabolic Panel: Recent Labs  Lab 12/03/19 2109 12/03/19 2112  NA 142 137  K 3.9 3.9  CL 100 97*  CO2  --  25  GLUCOSE 74 74  BUN 30* 27*  CREATININE 1.30* 1.44*  CALCIUM  --  9.1   GFR: Estimated Creatinine  Clearance: 65.2 mL/min (A) (by C-G formula based on SCr of 1.44 mg/dL (H)). Liver Function Tests: Recent Labs  Lab 12/03/19 2112  AST 41  ALT 39  ALKPHOS 78  BILITOT 0.5  PROT 7.2  ALBUMIN 2.8*   No results for input(s): LIPASE, AMYLASE in the last 168 hours. No results for input(s): AMMONIA in the last 168 hours. Coagulation Profile: No results for input(s): INR, PROTIME in the last 168 hours. Cardiac Enzymes: No results for input(s): CKTOTAL, CKMB, CKMBINDEX, TROPONINI in the last 168 hours. BNP (last 3 results) No results for input(s): PROBNP in the last 8760 hours. HbA1C: No results for input(s): HGBA1C in the last 72 hours. CBG: Recent Labs  Lab 12/03/19 2102 12/03/19 2207 12/03/19 2259  GLUCAP 63* 54* 119*   Lipid Profile: No results for input(s): CHOL, HDL, LDLCALC, TRIG, CHOLHDL, LDLDIRECT in the last 72 hours. Thyroid Function Tests: No results for input(s): TSH, T4TOTAL, FREET4, T3FREE, THYROIDAB in the last 72 hours. Anemia Panel: No results for input(s): VITAMINB12, FOLATE, FERRITIN, TIBC, IRON, RETICCTPCT in the last 72 hours. Urine analysis: No results found for: COLORURINE, APPEARANCEUR, LABSPEC, PHURINE, GLUCOSEU, [place], BILIRUBINUR, KETONESUR, PROTEINUR, UROBILINOGEN, NITRITE, LEUKOCYTESUR  Radiological Exams on Admission: CT Code Stroke CTA Head W/WO contrast  Result Date: 12/03/2019 CLINICAL DATA:  Left-sided weakness. EXAM: CT ANGIOGRAPHY HEAD AND NECK TECHNIQUE: Multidetector CT imaging of the head and neck was performed using the standard protocol during bolus administration of intravenous contrast. Multiplanar CT image reconstructions and MIPs were obtained to evaluate the vascular anatomy. Carotid stenosis measurements (when applicable) are obtained utilizing NASCET criteria, using the distal internal carotid diameter as the denominator. CONTRAST:  45m OMNIPAQUE IOHEXOL 350 MG/ML SOLN COMPARISON:  CT head without contrast 12/03/2019 FINDINGS: CTA  NECK FINDINGS Aortic arch: A 3 vessel arch configuration is present. Atherosclerotic calcifications are present without aneurysm or stenosis. Right carotid system: Atherosclerotic calcifications are present the wall of distal right common carotid artery without significant stenosis. Atherosclerotic calcifications are present at the bifurcation. Mixed density plaque is noted laterally without significant stenosis. Minimal luminal diameter is 2.5 mm. Left carotid system: The left common carotid artery also demonstrates some distal mural calcifications. Calcifications are present at the bifurcation without significant stenosis. Distal cervical left ICA is normal. Vertebral arteries: The vertebral arteries are codominant. High-grade stenosis is present at the origin of the left vertebral artery.  A 50% narrowing is present the origin of the right vertebral artery. No additional stenoses are present in either vertebral artery in the neck. Skeleton: Acquired fusion is present across the disc space at C3-4 fusion is also noted across the disc space at C6-7. Vertebral body heights and alignment are maintained. No focal lytic or blastic lesions are present. Other neck: Soft tissues of the neck are unremarkable. Upper chest: Prominent left pleural effusion is present. Associated atelectasis is present. Right lung is clear. Subcentimeter mediastinal nodes are likely reactive. Review of the MIP images confirms the above findings CTA HEAD FINDINGS Anterior circulation: Atherosclerotic calcifications are present supraclinoid internal carotid arteries bilaterally without a significant stenosis relative to the ICA termini. The A1 and M1 segments are normal. MCA bifurcations intact. The ACA and MCA branch vessels are within normal limits. Posterior circulation: Left vertebral artery is dominant. PICA origins are visualized and. The vertebrobasilar junction is within normal limits. Basilar artery is. Both posterior cerebral arteries  originate basilar tip. A prominent right posterior communicating artery contributes. Venous sinuses: The dural sinuses are patent. The straight sinus deep cerebral veins intact. Cortical veins are unremarkable. Anatomic variants: Prominent right posterior communicating artery. Review of the MIP images confirms the above findings IMPRESSION: 1. No emergent large vessel occlusion 2. Atherosclerotic changes in the distal common carotid arteries and at the carotid bifurcations bilaterally significant stenosis. 3.  Aortic Atherosclerosis (ICD10-I70.0). 4. Degenerative changes of the cervical spine including fusion at C3-4 and C6-7. These results were called by telephone at the time of interpretation on 12/03/2019 at 9:35pm to provider ERIC [facility] , who verbally acknowledged these results. Electronically Signed   By: San Morelle M.D.   On: 12/03/2019 21:41   CT Code Stroke CTA Neck W/WO contrast  Result Date: 12/03/2019 CLINICAL DATA:  Left-sided weakness. EXAM: CT ANGIOGRAPHY HEAD AND NECK TECHNIQUE: Multidetector CT imaging of the head and neck was performed using the standard protocol during bolus administration of intravenous contrast. Multiplanar CT image reconstructions and MIPs were obtained to evaluate the vascular anatomy. Carotid stenosis measurements (when applicable) are obtained utilizing NASCET criteria, using the distal internal carotid diameter as the denominator. CONTRAST:  110m OMNIPAQUE IOHEXOL 350 MG/ML SOLN COMPARISON:  CT head without contrast 12/03/2019 FINDINGS: CTA NECK FINDINGS Aortic arch: A 3 vessel arch configuration is present. Atherosclerotic calcifications are present without aneurysm or stenosis. Right carotid system: Atherosclerotic calcifications are present the wall of distal right common carotid artery without significant stenosis. Atherosclerotic calcifications are present at the bifurcation. Mixed density plaque is noted laterally without significant stenosis. Minimal  luminal diameter is 2.5 mm. Left carotid system: The left common carotid artery also demonstrates some distal mural calcifications. Calcifications are present at the bifurcation without significant stenosis. Distal cervical left ICA is normal. Vertebral arteries: The vertebral arteries are codominant. High-grade stenosis is present at the origin of the left vertebral artery. A 50% narrowing is present the origin of the right vertebral artery. No additional stenoses are present in either vertebral artery in the neck. Skeleton: Acquired fusion is present across the disc space at C3-4 fusion is also noted across the disc space at C6-7. Vertebral body heights and alignment are maintained. No focal lytic or blastic lesions are present. Other neck: Soft tissues of the neck are unremarkable. Upper chest: Prominent left pleural effusion is present. Associated atelectasis is present. Right lung is clear. Subcentimeter mediastinal nodes are likely reactive. Review of the MIP images confirms the above findings CTA HEAD FINDINGS  Anterior circulation: Atherosclerotic calcifications are present supraclinoid internal carotid arteries bilaterally without a significant stenosis relative to the ICA termini. The A1 and M1 segments are normal. MCA bifurcations intact. The ACA and MCA branch vessels are within normal limits. Posterior circulation: Left vertebral artery is dominant. PICA origins are visualized and. The vertebrobasilar junction is within normal limits. Basilar artery is. Both posterior cerebral arteries originate basilar tip. A prominent right posterior communicating artery contributes. Venous sinuses: The dural sinuses are patent. The straight sinus deep cerebral veins intact. Cortical veins are unremarkable. Anatomic variants: Prominent right posterior communicating artery. Review of the MIP images confirms the above findings IMPRESSION: 1. No emergent large vessel occlusion 2. Atherosclerotic changes in the distal  common carotid arteries and at the carotid bifurcations bilaterally significant stenosis. 3.  Aortic Atherosclerosis (ICD10-I70.0). 4. Degenerative changes of the cervical spine including fusion at C3-4 and C6-7. These results were called by telephone at the time of interpretation on 12/03/2019 at 9:35pm to provider ERIC [facility] , who verbally acknowledged these results. Electronically Signed   By: San Morelle M.D.   On: 12/03/2019 21:41   CT HEAD CODE STROKE WO CONTRAST  Result Date: 12/03/2019 CLINICAL DATA:  Code stroke.  Facial droop and left-sided weakness. EXAM: CT HEAD WITHOUT CONTRAST TECHNIQUE: Contiguous axial images were obtained from the base of the skull through the vertex without intravenous contrast. COMPARISON:  None. FINDINGS: Brain: Mild generalized atrophy and white matter disease is present. No acute infarct, hemorrhage, or mass lesion is present. Basal ganglia are intact. Insular ribbon is normal bilaterally. No acute or focal cortical abnormality is present. The ventricles are of normal size. No significant extraaxial fluid collection is present. The brainstem and cerebellum are within normal limits. Vascular: Atherosclerotic changes are present within the cavernous internal carotid arteries. No hyperdense vessel is present. Skull: Calvarium is intact. No focal lytic or blastic lesions are present. No significant extracranial soft tissue lesion is present. Sinuses/Orbits: Mucosal scratched at circumferential mucosal thickening is present in the maxillary sinuses, left greater than right. No fluid levels are present. Posterior left ethmoid air cells opacified. Mucosal thickening is present in the inferior frontal sinuses bilaterally. Mastoid air cells are clear. Globes and orbits are within normal limits. ASPECTS [facility] Stroke Program Early CT Score) - Ganglionic level infarction (caudate, lentiform nuclei, internal capsule, insula, M1-M3 cortex): 7/7 - Supraganglionic infarction  (M4-M6 cortex): 3/3 Total score (0-10 with 10 being normal): 10/10 IMPRESSION: 1. No acute intracranial abnormality. 2. Mild generalized atrophy and white matter disease. This likely reflects the sequela of chronic microvascular ischemia. 3. ASPECTS is 10/10. 4. Mild sinus disease. The above was relayed via text pager to Dr. Cheral Marker on 12/03/2019 at 21:20 . Electronically Signed   By: San Morelle M.D.   On: 12/03/2019 21:20    EKG: Personally reviewed.  Rhythm is rhythm with heart rate of 68 bpm.  Notable Q waves in the inferior leads.  no dynamic ST segment changes appreciated.  Assessment/Plan Principal Problem:   Acute left-sided weakness   Gradual development of left-sided weakness throughout the day with associated facial droop and mild dysarthria with concerns for stroke versus TIA.  With patient's rapidly resolving symptoms upon arrival to the emergency department, TIA is more likely.  ABCD score of 6 suggesting that this a high risk TIA  Continuing home regimen of anticoagulation with Eliquis  Increasing home regimen of statin therapy to atorvastatin 80 mg daily  Close monitoring of blood pressure  Frequent neurologic  checks  Placing patient on telemetry  PT/OT and speech therapy evaluations  Dr. Cheral Marker with neurology has already evaluated the patient and neurology will continue to follow.  Neurology recommends obtaining MRI brain, MRA brain and echocardiography  Active Problems:   Uncontrolled type 2 diabetes mellitus with hypoglycemia without coma, with long-term current use of insulin ([place])   Patient was initially hypoglycemic on arrival which has responded to administration of dextrose  While hypoglycemia can sometimes mimic strokelike symptoms patient states that his blood sugar was normal at home and therefore I do not believe that this is the case here.  Placing patient on Accu-Cheks before every meal and nightly with sliding scale insulin  Obtaining  hemoglobin A1c  Continue home regimen of NovoLog 70/30  monitoring for additional episodes of hypoglycemia.    Essential hypertension   Continue home regimen of antihypertensives including amlodipine and metoprolol  If MRI brain does indeed reveal an acute stroke will discontinue these medications and allow for permissive hypertension.    Paroxysmal atrial fibrillation (HCC)   Continuing home regimen of Eliquis  Continue home regimen of amiodarone  Patient reports that he is to undergo cardiac ablation with electrophysiology in several weeks  Monitoring patient on telemetry  Patient is currently normal sinus rhythm    Mixed diabetic hyperlipidemia associated with type 2 diabetes mellitus ([place])   Increasing statin regimen to atorvastatin 80 mg daily for risk reduction  Obtaining lipid panel    Code Status:  Full code Family Communication: Daughter updated via telephone Disposition Plan: Patient is anticipated to be discharged to home with home health services once patient has met maximum benefit from current hospitalization.   Consults called: Dr. Cheral Marker with neurology has already evaluated the patient in the emergency department.  Neurology to continue to follow. Admission status: Patient will be admitted to Observation and is anticipated to remain in the hospital for less than 2 midnights.  Status is: Observation  The patient remains OBS appropriate and will d/c before 2 midnights.  Dispo: The patient is from: Home              Anticipated d/c is to: Home              Anticipated d/c date is: 2 days              Patient currently is not medically stable to d/c.        Vernelle Emerald MD [facility] Pager [phone]  If 7PM-7AM, please contact night-coverage [email or web address] Use universal [place] password for that web site. If you do not have the password, please call the hospital operator.  12/04/2019, 12:06 AM

## 2019-12-05 DIAGNOSIS — R471 Dysarthria and anarthria: Secondary | ICD-10-CM

## 2019-12-05 LAB — CBC
HCT: 33.1 % — ABNORMAL LOW (ref 39.0–52.0)
Hemoglobin: 10 g/dL — ABNORMAL LOW (ref 13.0–17.0)
MCH: 26.8 pg (ref 26.0–34.0)
MCHC: 30.2 g/dL (ref 30.0–36.0)
MCV: 88.7 fL (ref 80.0–100.0)
Platelets: 350 10*3/uL (ref 150–400)
RBC: 3.73 MIL/uL — ABNORMAL LOW (ref 4.22–5.81)
RDW: 14.6 % (ref 11.5–15.5)
WBC: 10 10*3/uL (ref 4.0–10.5)
nRBC: 0 % (ref 0.0–0.2)

## 2019-12-05 LAB — BASIC METABOLIC PANEL
Anion gap: 8 (ref 5–15)
BUN: 16 mg/dL (ref 8–23)
CO2: 30 mmol/L (ref 22–32)
Calcium: 9 mg/dL (ref 8.9–10.3)
Chloride: 105 mmol/L (ref 98–111)
Creatinine, Ser: 1.12 mg/dL (ref 0.61–1.24)
GFR calc Af Amer: >60 mL/min (ref >60)
GFR calc non Af Amer: >60 mL/min (ref >60)
Glucose, Bld: 78 mg/dL (ref 70–99)
Potassium: 3.3 mmol/L — ABNORMAL LOW (ref 3.5–5.1)
Sodium: 143 mmol/L (ref 135–145)

## 2019-12-05 LAB — GLUCOSE, CAPILLARY
Glucose-Capillary: 70 mg/dL (ref 70–99)
Glucose-Capillary: 92 mg/dL (ref 70–99)
Glucose-Capillary: 112 mg/dL — ABNORMAL HIGH (ref 70–99)
Glucose-Capillary: 113 mg/dL — ABNORMAL HIGH (ref 70–99)
Glucose-Capillary: 98 mg/dL (ref 70–99)

## 2019-12-05 LAB — PHOSPHORUS: Phosphorus: 3.9 mg/dL (ref 2.5–4.6)

## 2019-12-05 LAB — MAGNESIUM: Magnesium: 2.1 mg/dL (ref 1.7–2.4)

## 2019-12-05 MED ORDER — INSULIN ASPART PROT & ASPART (70-30 MIX) 100 UNIT/ML ~~LOC~~ SUSP
20.0000 [IU] | Freq: Two times a day (BID) | SUBCUTANEOUS | Status: DC
  Administered 2019-12-05 – 2019-12-07 (×5): 20 [IU] via SUBCUTANEOUS
  Filled 2019-12-05: qty 10

## 2019-12-05 MED ORDER — POTASSIUM CHLORIDE 20 MEQ PO PACK
40.0000 meq | PACK | Freq: Once | ORAL | Status: AC
  Administered 2019-12-05: 40 meq via ORAL
  Filled 2019-12-05: qty 2

## 2019-12-05 NOTE — Progress Notes (Signed)
Inpatient Rehab Admissions Coordinator Note:   Per PT/OT recommendations, pt was screened for CIR candidacy by Stephania Fragmin, PT, DPT.  At this time we are recommending inpatient rehab consult. I will place an order per our protocol.     Stephania Fragmin, PT,DPT 12/05/19  5:06 PM

## 2019-12-05 NOTE — Progress Notes (Signed)
Patient MEWS yellow due to BP of 100/45 and RR of 22. Patient assessed, non-symptomatic. MD notified. No new orders. Will continue to monitor. Melony Overly, RN

## 2019-12-05 NOTE — Progress Notes (Signed)
STROKE TEAM PROGRESS NOTE   INTERVAL HISTORY Daughter is at the bedside.  Patient sitting in chair, stated that his left sided weakness improved from yesterday. His BP overnight 120s but this am again 105. Metoprolol has been discontinued for low BP and HR. His glucose was relatively low this am, was given orange juice. Repeat at 92. He would like his insulin to be prior to lunch and dinner to mimic what he takes at home.    OBJECTIVE Vitals:   12/04/19 1934 12/04/19 2350 12/05/19 0330 12/05/19 0802  BP: 97/61 (!) 124/58 (!) 121/56 105/84  Pulse: (!) 58 (!) 57 (!) 57 (!) 59  Resp: 17 17 18 18   Temp: (!) 97.5 F (36.4 C) 98 F (36.7 C) 98.4 F (36.9 C) 97.7 F (36.5 C)  TempSrc: Oral Oral Oral Oral  SpO2: 95% 94% 96% 95%  Weight:      Height:        CBC:  Recent Labs  Lab 12/03/19 2112 12/05/19 0400  WBC 13.8* 10.0  NEUTROABS 8.2*  --   HGB 10.9* 10.0*  HCT 36.3* 33.1*  MCV 89.4 88.7  PLT 393 350    Basic Metabolic Panel:  Recent Labs  Lab 12/03/19 2112 12/05/19 0400  NA 137 143  K 3.9 3.3*  CL 97* 105  CO2 25 30  GLUCOSE 74 78  BUN 27* 16  CREATININE 1.44* 1.12  CALCIUM 9.1 9.0  MG  --  2.1  PHOS  --  3.9    Lipid Panel:     Component Value Date/Time   CHOL 96 12/04/2019 0503   TRIG 59 12/04/2019 0503   HDL 32 (L) 12/04/2019 0503   CHOLHDL 3.0 12/04/2019 0503   VLDL 12 12/04/2019 0503   LDLCALC 52 12/04/2019 0503   HgbA1c:  Lab Results  Component Value Date   HGBA1C 7.6 (H) 12/04/2019   Urine Drug Screen: No results found for: LABOPIA, COCAINSCRNUR, LABBENZ, AMPHETMU, THCU, LABBARB  Alcohol Level No results found for: [facility]  IMAGING  CT Code Stroke CTA Head W/WO contrast CT Code Stroke CTA Neck W/WO contrast 12/03/2019 IMPRESSION:  1. No emergent large vessel occlusion  2. Atherosclerotic changes in the distal common carotid arteries and at the carotid bifurcations bilaterally significant stenosis.  3. Aortic Atherosclerosis (ICD10-I70.0).   4. Degenerative changes of the cervical spine including fusion at C3-4 and C6-7.  CT HEAD CODE STROKE WO CONTRAST 12/03/2019 IMPRESSION:  1. No acute intracranial abnormality.  2. Mild generalized atrophy and white matter disease. This likely reflects the sequela of chronic microvascular ischemia.  3. ASPECTS is 10/10.  4. Mild sinus disease.   MRI / MRA Head 1. 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 2. Mild chronic small vessel ischemic disease. 3. Negative head MRA.  DG Chest Port 1 View 12/04/2019 IMPRESSION: Left basilar veiling opacity consistent with consolidation and/or Effusion.   Transthoracic Echocardiogram  1. Left ventricular ejection fraction, by estimation, is 55 to 60%. The  left ventricle has normal function. The left ventricle has no regional  wall motion abnormalities. The left ventricular internal cavity size was  mildly dilated. There is mild  concentric left ventricular hypertrophy. Left ventricular diastolic  parameters are consistent with Grade III diastolic dysfunction  (restrictive). Elevated left ventricular end-diastolic pressure.  2. Right ventricular systolic function is normal. The right ventricular  size is mildly enlarged.  3. Left atrial size was mildly dilated.  4. Right atrial size was mild to moderately dilated.  5. The mitral valve is grossly normal. Trivial mitral valve  regurgitation. No evidence of mitral stenosis.  6. The aortic valve is tricuspid. Aortic valve regurgitation is mild to  moderate. Moderate aortic valve stenosis.  7. The inferior vena cava is dilated in size with >50% respiratory  variability, suggesting right atrial pressure of 8 mmHg.    ECG - SR rate 68 BPM. (See cardiology reading for complete details)   PHYSICAL EXAM  Temp:  [97.3 F (36.3 C)-98.4 F (36.9 C)] 97.7 F (36.5 C) (04/18 0802) Pulse Rate:  [56-66] 59 (04/18 0802) Resp:  [17-18] 18 (04/18 0802) BP:  (97-124)/(40-84) 105/84 (04/18 0802) SpO2:  [92 %-96 %] 95 % (04/18 0802) FiO2 (%):  [18 %] 18 % (04/17 1638)  General - Well nourished, well developed, in no apparent distress.  Ophthalmologic - fundi not visualized due to noncooperation.  Cardiovascular - Regular rhythm and rate, not in A. fib.  Mental Status -  Level of arousal and orientation to time, place, and person were intact. Language including expression, naming, repetition, comprehension was assessed and found intact.   Cranial Nerves II - XII - II - Visual field intact OU. III, IV, VI - Extraocular movements intact. V - Facial sensation intact bilaterally. VII - mild left facial mild droop. VIII - Hearing & vestibular intact bilaterally. X - Palate elevates symmetrically. XI - Chin turning & shoulder shrug intact bilaterally. XII - Tongue protrusion intact.  Motor Strength - The patient's strength was normal in right upper and lower extremities, however left upper extremity 3+/5 proximal and distal, left lower extremity 4/5 proximal and 3+/5 distally. Bulk was normal and fasciculations were absent.   Motor Tone - Muscle tone was assessed at the neck and appendages and was normal.  Reflexes - The patient's reflexes were symmetrical in all extremities and he had no pathological reflexes.  Sensory - Light touch, temperature/pinprick were assessed and were symmetrical.    Coordination - The patient had normal movements in the right hand with no ataxia or dysmetria.  Tremor was absent.  Gait and Station - deferred.    ASSESSMENT/PLAN [name] is a 74 y.o. male with history of atrial fibrillation and is on Eliquis, asthma, Htn and DM presents with acute onset of left hemiplegia, left facial droop and dysarthria.  He did not receive IV t-PA due to anticoagulation.  Stroke: right BG/CR small scattered infarcts, likely due to right CCA mixed plaque. Although afib could be potential etiology, but he is on  eliquis and stroke location not typical for afib related infarct.   CT Head - No acute intracranial abnormality.   CTA H&N - No emergent large vessel occlusion. Atherosclerotic changes in the distal common carotid arteries and at the carotid bifurcations bilaterally significant stenosis.   MRI head right BG/CR small infarcts  MRA head - normal  2D Echo EF 55-60%  [employer] Virus 2 - negative  LDL - 52  HgbA1c - 6.5  VTE prophylaxis - Eliquis  Eliquis (apixaban) daily prior to admission, now on aspirin 81 mg daily and Eliquis (apixaban) daily. Continue on discharge  Patient counseled to be compliant with his antithrombotic medications  Ongoing aggressive stroke risk factor management  Therapy recommendations: SNF  Disposition:  Pending  Hypotension with fluctuation of neuro symptoms  Home BP meds: metoprolol, lasix 80  Low end of BP  D/c metoprolol  Decrease lasix to 40 . Avoid low BP . Orthostatic vital pending  . May  consider TED hose if needed . Long-term BP goal normotensive  PAF   Currently in sinus  Rate controlled  Metoprolol PTA  Now metoprolol on hold due to low BP and low HR  Continue Eliquis  Hyperlipidemia  Home Lipid lowering medication: Lipitor 40 mg daily  LDL 52, goal < 70  Current lipid lowering medication: Lipitor 80 mg daily   Continue statin at discharge  Diabetes  Home diabetic meds: insulin  Current diabetic meds: insulin 70/30 -> change to home schedule  HgbA1c 6.5, goal < 7.0  CBG monitoring  SSI  PCP follow up  Other Stroke Risk Factors  Advanced age  Previous ETOH use  Obesity, Body mass index is 34.99 kg/m., recommend weight loss, diet and exercise as appropriate   Other Active Problems  Code status - Full code  Leukocytosis - 13.8->10.0 (afebrile)  CKD stage 3a - creatinine - 1.30->1.44->1.12  Hypokalemia - 3.9->3.3 - supplemented (lasix 40 mg daily)  Anemia - Hb 10.9->10.0   Hospital  day # 1  Neurology will sign off. Please call with questions. Pt will follow up with stroke clinic NP at [facility] in about 4 weeks. Thanks for the consult. I had long discussion with pt and daughtger at bedside, updated pt current condition, treatment plan and potential prognosis, and answered all the questions. They expressed understanding and appreciation.    Rosalin Hawking, MD PhD Stroke Neurology 12/05/2019 12:32 PM    To contact Stroke Continuity provider, please refer to [email or web address] After hours, contact General Neurology

## 2019-12-05 NOTE — Evaluation (Signed)
Speech Language Pathology Evaluation Patient Details Name: Brandon Sanchez MRN: [id number] DOB: October 10, 1945 Today's Date: 12/05/2019 Time: 9242-6834 SLP Time Calculation (min) (ACUTE ONLY): 26 min  Problem List:  Patient Active Problem List   Diagnosis Date Noted  . Cerebral embolism with cerebral infarction 12/04/2019  . CVA (cerebral vascular accident) ([place]) 12/04/2019  . Acute left-sided weakness 12/03/2019  . Mixed diabetic hyperlipidemia associated with type 2 diabetes mellitus ([place]) 12/03/2019  . Paroxysmal atrial fibrillation (HCC)   . Secondary hypercoagulable state ([place]) 09/28/2019  . Atrial fibrillation with rapid ventricular response ([place]) 09/20/2019  . CAP (community acquired pneumonia) 09/20/2019  . Obesity 05/12/2015  . Uncontrolled type 2 diabetes mellitus with hypoglycemia without coma, with long-term current use of insulin ([place]) 02/04/2008  . HLD (hyperlipidemia) 02/04/2008  . Essential hypertension 02/04/2008  . ALLERGIC RHINITIS 02/04/2008  . 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 19/62/2297   Past Medical History:  Past Medical History:  Diagnosis Date  . Allergic rhinitis, cause unspecified   . Asthma   . Other and unspecified hyperlipidemia   . Type II or unspecified type diabetes mellitus without mention of complication, not stated as uncontrolled   . Unspecified essential hypertension    Past Surgical History:  Past Surgical History:  Procedure Laterality Date  . CARDIOVERSION N/A 09/22/2019   Procedure: CARDIOVERSION;  Surgeon: Donato Heinz, MD;  Location: [facility] ENDOSCOPY;  Service: Endoscopy;  Laterality: N/A;  . CARDIOVERSION N/A 10/18/2019   Procedure: CARDIOVERSION;  Surgeon: Jerline Pain, MD;  Location: [facility] ENDOSCOPY;  Service: Cardiovascular;  Laterality: N/A;  . TEE WITHOUT CARDIOVERSION N/A 09/22/2019   Procedure: TRANSESOPHAGEAL ECHOCARDIOGRAM (TEE);  Surgeon: Donato Heinz, MD;  Location: [facility] ENDOSCOPY;  Service:  Endoscopy;  Laterality: N/A;   HPI:  Pt is a 74 year old male with past medical history of diabetes mellitus type 2, paroxysmal atrial fibrillation, hyperlipidemia, hypertension who presented to the ED with left-sided weakness and left facial droop. MRI brain: Small acute posterior right basal ganglia/corona radiata infarct.   Assessment / Plan / Recommendation Clinical Impression  Pt participated in speech/language/cognition evaluation with his daughter present. Pt and his daughter reported that the pt's speech was initially "slurred" but stated that it has since returned to baseline. Pt is a retired Chief Executive Officer and expressed that he was living independently prior to admission without any significant deficits in speech, language or cognition. The [facility] Mental Status Examination was completed to evaluate the pt's cognitive-linguistic skills. He achieved a score of 28/30 which is within the normal limits of 27 or more out of 30. Pt was also able to independently complete informal, higher-level cognitive-linguistic tasks requiring executive functioning. Mild articulatory imprecision was noted but speech intelligibility was [facility] and this appears to be his baseline based on pt/family report. Further skilled SLP services are not clinically indicated at this time. Pt and family were educated regarding this and both  parties verbalized understanding as well as agreement with plan of care.    SLP Assessment  SLP Recommendation/Assessment: Patient does not need any further Speech Lanaguage Pathology Services SLP Visit Diagnosis: Cognitive communication deficit (R41.841)    Follow Up Recommendations  None    Frequency and Duration           SLP Evaluation Cognition  Overall Cognitive Status: Within Functional Limits for tasks assessed Arousal/Alertness: Awake/alert Orientation Level: Oriented X4 Attention: Focused;Sustained Focused Attention: Appears intact Sustained Attention: Appears  intact Memory: Appears intact(Immediate: 5/5; delayed: 5/5) Awareness: Appears intact Problem Solving:  Appears intact Executive Function: Reasoning;Sequencing Reasoning: (Abstract: 2/2) Sequencing: Appears intact       Comprehension  Auditory Comprehension Overall Auditory Comprehension: Appears within functional limits for tasks assessed Yes/No Questions: Within Functional Limits Commands: Within Functional Limits Conversation: Complex Visual Recognition/Discrimination Discrimination: Within Function Limits    Expression Expression Primary Mode of Expression: Verbal Verbal Expression Overall Verbal Expression: Appears within functional limits for tasks assessed Initiation: No impairment Repetition: No impairment Naming: No impairment Pragmatics: No impairment   Oral / Motor  Oral Motor/Sensory Function Overall Oral Motor/Sensory Function: Mild impairment Facial ROM: Reduced left Facial Symmetry: Abnormal symmetry left;Suspected CN VII (facial) dysfunction Facial Strength: Reduced left;Suspected CN VII (facial) dysfunction Facial Sensation: Within Functional Limits Lingual ROM: Within Functional Limits Lingual Symmetry: Within Functional Limits Lingual Strength: Reduced;Suspected CN XII (hypoglossal) dysfunction Lingual Sensation: Within Functional Limits Velum: Within Functional Limits Mandible: Within Functional Limits Motor Speech Overall Motor Speech: Impaired at baseline Respiration: Within functional limits Phonation: Normal Resonance: Within functional limits Articulation: Impaired Level of Impairment: Conversation Intelligibility: Intelligible Motor Planning: Witnin functional limits Motor Speech Errors: Aware;Consistent   Yerachmiel Spinney I. Vear Clock, MS, CCC-SLP Acute Rehabilitation Services Office number [phone] Pager [phone]                  Scheryl Marten 12/05/2019, 1:03 PM

## 2019-12-05 NOTE — Progress Notes (Signed)
Physical Therapy Treatment Patient Details Name: Brandon Sanchez MRN: [id number] DOB: [birth year] Today's Date: 12/05/2019    History of Present Illness 74 year old male with past medical history of diabetes mellitus type 2, paroxysmal atrial fibrillation, hyperlipidemia, hypertension who presents to [facility] emergency department with left-sided weakness and left facial droop.. Small acute posterior right basal ganglia/corona radiata infarct    PT Comments    Pt in bed with daughter present upon arrival of PT, agreeable to PT session with focus on progression of mobility and ambulation. The pt presents with continued deficits in L-sided strength, but sig improvements in functional use and control of both LUE and LLE since yesterday. The pt was able to complete a sit-stand transfer with modA of 2 and take lateral steps with sig assist of 2 and use of a RW. This is a significant improvement in mobility from the pt's initial evaluation, and therefore I feel that CIR is now a more appropriate d/c plan for this pt to maximize his strength and independence prior to return home.    Follow Up Recommendations  CIR;Supervision/Assistance - 24 hour     Equipment Recommendations  (defer to post acute)    Recommendations for Other Services Rehab consult     Precautions / Restrictions Precautions Precautions: Fall Restrictions Weight Bearing Restrictions: No    Mobility  Bed Mobility Overal bed mobility: Needs Assistance Bed Mobility: Rolling;Sidelying to Sit Rolling: Min assist Sidelying to sit: Min assist       General bed mobility comments: pt able to initiate movement of BLE to EOB with minA to complete, then minA to raise trunk from elevated HOB with sig use of bed rail.  Transfers Overall transfer level: Needs assistance Equipment used: Rolling walker (2 wheeled) Transfers: Sit to/from [employer] Sit to Stand: Mod assist;+2 physical assistance;Min  assist   Squat pivot transfers: Mod assist;+2 physical assistance;Max assist     General transfer comment: pt able to complete sit-stand from elevated HOB with min/modA of 2 to power up and minA of 2 to steady upon rise. The pt was then able to take small lateral steps to recliner with significant fatige and increased need for assist  Ambulation/Gait Ambulation/Gait assistance: Mod assist;+2 physical assistance Gait Distance (Feet): 4 Feet Assistive device: Rolling walker (2 wheeled) Gait Pattern/deviations: Step-to pattern;Shuffle;Trunk flexed     General Gait Details: pt with slow step-to movements of BLE with VC for each step/RW movement. Pt with difficulty moving LLE to R, pt fatigues quickly and has significant L lean with fatigue   Stairs             Wheelchair Mobility    Modified Rankin (Stroke Patients Only) Modified Rankin (Stroke Patients Only) Pre-Morbid Rankin Score: Moderate disability Modified Rankin: Moderately severe disability     Balance Overall balance assessment: Needs assistance Sitting-balance support: Feet supported;Single extremity supported Sitting balance-Leahy Scale: Fair Sitting balance - Comments: sig improvement in midline today with only VC, supervision Postural control: Posterior lean;Left lateral lean   Standing balance-Leahy Scale: Poor Standing balance comment: reliant on BUE support and external support                            Cognition Arousal/Alertness: Awake/alert Behavior During Therapy: WFL for tasks assessed/performed Overall Cognitive Status: Within Functional Limits for tasks assessed  General Comments: Pt with improved engagement and motivation today      Exercises General Exercises - Lower Extremity Long Arc Quad: AROM;10 reps;Seated;Right Hip Flexion/Marching: AROM;Right;10 reps;Seated    General Comments        Pertinent Vitals/Pain Pain  Assessment: No/denies pain    Home Living       Type of Home: House              Prior Function            PT Goals (current goals can now be found in the care plan section) Acute Rehab PT Goals Patient Stated Goal: to get stronger PT Goal Formulation: With patient Time For Goal Achievement: 12/18/19 Potential to Achieve Goals: Good Progress towards PT goals: Progressing toward goals    Frequency    Min 4X/week      PT Plan Discharge plan needs to be updated    Co-evaluation              AM-PAC PT "6 Clicks" Mobility   Outcome Measure  Help needed turning from your back to your side while in a flat bed without using bedrails?: A Little Help needed moving from lying on your back to sitting on the side of a flat bed without using bedrails?: A Lot Help needed moving to and from a bed to a chair (including a wheelchair)?: A Lot Help needed standing up from a chair using your arms (e.g., wheelchair or bedside chair)?: A Lot Help needed to walk in hospital room?: A Lot Help needed climbing 3-5 steps with a railing? : Total 6 Click Score: 12    End of Session Equipment Utilized During Treatment: Gait belt Activity Tolerance: Patient tolerated treatment well Patient left: with call bell/phone within reach;in chair;with chair alarm set;with family/visitor present Nurse Communication: Mobility status PT Visit Diagnosis: Difficulty in walking, not elsewhere classified (R26.2);Muscle weakness (generalized) (M62.81);Hemiplegia and hemiparesis Hemiplegia - Right/Left: Left Hemiplegia - dominant/non-dominant: Non-dominant Hemiplegia - caused by: Cerebral infarction     Time: 6945-0388 PT Time Calculation (min) (ACUTE ONLY): 39 min  Charges:  $Gait Training: 8-22 mins $Therapeutic Exercise: 8-22 mins $Neuromuscular Re-education: 8-22 mins                     Karma Ganja, PT, DPT   Acute Rehabilitation Department Pager #: [phone]   Otho Bellows 12/05/2019, 1:21 PM

## 2019-12-05 NOTE — TOC Initial Note (Signed)
Transition of Care [facility]) - Initial/Assessment Note    Patient Details  Name: Brandon Sanchez MRN: [id number] Date of Birth: [birth year]  Transition of Care [facility]) CM/SW Contact:    Geralynn Ochs, LCSW Phone Number: 12/05/2019, 2:31 PM  Clinical Narrative:     CSW met with patient to discuss discharge planning. Patient indicated that he is hopeful for inpatient rehab at discharge, that would be his first choice in terms of disposition. CSW explained that patient would need 24/7 support at discharge from CIR, that is a program requirement, and patient indicated that he would work on contacting some people that he knows to see about who would be available. Patient explained how he has been in therapy for his knees before and he's done sports all his life, so he knows how to work hard and enjoys exercise, so hopeful that he can bounce back quickly. CSW to follow for SNF back-up if patient does not qualify for CIR.              Expected Discharge Plan: IP Rehab Facility Barriers to Discharge: Continued Medical Work up   Patient Goals and CMS Choice Patient states their goals for this hospitalization and ongoing recovery are:: to get to inpatient rehab CMS Medicare.gov Compare Post Acute Care list provided to:: Patient Choice offered to / list presented to : Patient  Expected Discharge Plan and Services Expected Discharge Plan: [place]     Post Acute Care Choice: IP Rehab Living arrangements for the past 2 months: Apartment                                      Prior Living Arrangements/Services Living arrangements for the past 2 months: Apartment Lives with:: Self Patient language and need for interpreter reviewed:: No Do you feel safe going back to the place where you live?: Yes      Need for Family Participation in Patient Care: No (Comment) Care giver support system in place?: No (comment) Current home services: Housekeeping Criminal Activity/Legal  Involvement Pertinent to Current Situation/Hospitalization: No - Comment as needed  Activities of Daily Living Home Assistive Devices/Equipment: Cane (specify quad or straight) ADL Screening (condition at time of admission) Patient's cognitive ability adequate to safely complete daily activities?: Yes Is the patient deaf or have difficulty hearing?: No Does the patient have difficulty seeing, even when wearing glasses/contacts?: No Does the patient have difficulty concentrating, remembering, or making decisions?: No Patient able to express need for assistance with ADLs?: Yes Does the patient have difficulty dressing or bathing?: Yes Independently performs ADLs?: No Does the patient have difficulty walking or climbing stairs?: Yes Weakness of Legs: Both Weakness of Arms/Hands: Left  Permission Sought/Granted Permission sought to share information with : Family Supports Permission granted to share information with : Yes, Verbal Permission Granted  Share Information with NAME: Carmell Austria     Permission granted to share info w Relationship: Daughter     Emotional Assessment Appearance:: Appears stated age Attitude/Demeanor/Rapport: Engaged Affect (typically observed): Appropriate Orientation: : Oriented to Self, Oriented to Place, Oriented to  Time, Oriented to Situation Alcohol / Substance Use: Not Applicable Psych Involvement: No (comment)  Admission diagnosis:  Numbness [R20.0] Slurred speech [R47.81] Acute left-sided weakness [R53.1] Dysarthria [R47.1] Left-sided weakness [R53.1] CVA (cerebral vascular accident) [facility]) [I63.9] Patient Active Problem List   Diagnosis Date Noted  . Cerebral embolism with cerebral infarction 12/04/2019  .  CVA (cerebral vascular accident) ([place]) 12/04/2019  . Acute left-sided weakness 12/03/2019  . Mixed diabetic hyperlipidemia associated with type 2 diabetes mellitus ([employer]) 12/03/2019  . Paroxysmal atrial fibrillation (HCC)   . Secondary  hypercoagulable state ([place]) 09/28/2019  . Atrial fibrillation with rapid ventricular response ([place]) 09/20/2019  . CAP (community acquired pneumonia) 09/20/2019  . Obesity 05/12/2015  . Uncontrolled type 2 diabetes mellitus with hypoglycemia without coma, with long-term current use of insulin ([place]) 02/04/2008  . HLD (hyperlipidemia) 02/04/2008  . Essential hypertension 02/04/2008  . ALLERGIC RHINITIS 02/04/2008  . 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 95/02/2256   PCP:  Deland Pretty, MD Pharmacy:   [facility], [place] [address] Phone: [phone] Fax: [place], [place] [address] Phone: [phone] Fax: [phone]  [facility] [phone] - [place], [place] [address] [place] [address] Phone: [phone] Fax: [phone]  [facility] [place] [place] - [address] [place] [address] Phone: [phone] Fax: [phone]     Social Determinants of Health (SDOH) Interventions    Readmission Risk Interventions No flowsheet data found.

## 2019-12-05 NOTE — Progress Notes (Signed)
PROGRESS NOTE    Leroy KennedyWilliam C Avitia  WUJ:[id number]:[id number] DOB: [birth year] DOA: 12/03/2019 PCP: Merri BrunettePharr, Walter, MD   Brief Narrative:  74 year old male with past medical history of diabetes mellitus type 2, paroxysmal atrial fibrillation, hyperlipidemia, hypertension who presents to [facility]  emergency department with left-sided weakness and left facial droop.  Patient explains that at approximately 8:00 this morning began to notice that he was having difficulty with ambulation.  Patient did not pay much mind.  As the day progressed, he continued to have difficulty with ambulation.  By the early afternoon, the patient noticed that he began to develop severe weakness of the left side and difficulty with moving his left lower extremity in any capacity.  More, he felt that his left face was "tingling" and that "I could not speak right."  Patient denies any associated chest pain, palpitations, lightheadedness, confusion, changes in vision.  At 6 PM, the patient was concerned that he may be having a stroke and therefore contacted family who then contacted 911.  Patient was brought into [facility] emergency department via EMS and was deemed a code stroke on arrival.  Shortly after arrival to the emergency department, patient was quickly evaluated by neurology.  Furthermore, patient underwent initial imaging of the brain including noncontrast CT imaging as well as CT angiogram of the head and neck.  Due to timeline of symptoms and patient already being on Eliquis,  TPA was not administered.  During this initial evaluation, patient's left-sided weakness began to rapidly improve spontaneously with neurology suspecting that the patient symptoms are more likely secondary to TIA.  The hospitalist group was then called to assess patient for mission to the hospital.  MRI shows small basal ganglia corona radiata infarct.  Echocardiogram EF 55 to 60%.  Stroke work-up has been completed.  PT recommended CIR  awaiting insurance approval and bed placement.  Assessment & Plan:   Principal Problem:   Acute left-sided weakness Active Problems:   Uncontrolled type 2 diabetes mellitus with hypoglycemia without coma, with long-term current use of insulin (HCC)   Essential hypertension   Paroxysmal atrial fibrillation (HCC)   Mixed diabetic hyperlipidemia associated with type 2 diabetes mellitus (HCC)   Cerebral embolism with cerebral infarction   CVA (cerebral vascular accident) (HCC)  Acute left-sided weakness due to CVA:   Gradual development of left-sided weakness throughout the day with associated facial droop and mild dysarthria with concerns for stroke versus TIA.  With patient's rapidly resolving symptoms upon arrival to the emergency department, TIA is more likely.  Continuing home regimen of anticoagulation with Eliquis  Increasing home regimen of statin therapy to atorvastatin 80 mg daily  Close monitoring of blood pressure  Frequent neurologic checks  Placing patient on telemetry  PT/OT and speech therapy evaluations  Dr. Otelia LimesLindzen with neurology has already evaluated the patient and neurology will continue to follow.  Neurology recommends obtaining MRI brain, MRA brain and echocardiography  MRI: Small acute posterior right basal ganglia/corona radiata infarct.  Neuro recommend continuing aspirin and Eliquis.    Uncontrolled type 2 diabetes mellitus with hypoglycemia without coma, with long-term current use of insulin (HCC)   Patient was initially hypoglycemic on arrival which has responded to administration of dextrose  Placing patient on Accu-Cheks before every meal and nightly with sliding scale insulin  Obtaining hemoglobin A1c  Continue home regimen of NovoLog 70/30  monitoring for additional episodes of hypoglycemia.    Essential hypertension   Continue home regimen of antihypertensives including amlodipine  and metoprolol  Metoprolol has been  discontinued due to patient having soft blood pressure.    Paroxysmal atrial fibrillation (HCC)   Continuing home regimen of Eliquis  Continue home regimen of amiodarone  Patient reports that he is to undergo cardiac ablation with electrophysiology in several weeks  Monitoring patient on telemetry  Patient is currently normal sinus rhythm  HR controlled,     Mixed diabetic hyperlipidemia associated with type 2 diabetes mellitus ([place])   Increasing statin regimen to atorvastatin 80 mg daily for risk reduction  Obtaining lipid panel    Code Status:  Full code Family Communication: Daughter updated via telephone. Disposition Plan:  CIR awaiting bed and insurance approval  Consults called: Dr. Cheral Marker with neurology    Consultants:   Neurology.  Procedures:  Antimicrobials:  Anti-infectives (From admission, onward)   None     Subjective: Patient was seen and examined at bedside.  No overnight events. He reports numbness on the left side associated with weakness in the left leg has significantly improved.  Objective: Vitals:   12/05/19 0330 12/05/19 0802 12/05/19 1130 12/05/19 1313  BP: (!) 121/56 105/84 119/63   Pulse: (!) 57 (!) 59 70 80  Resp: 18 18 (!) 22   Temp: 98.4 F (36.9 C) 97.7 F (36.5 C) 98.6 F (37 C)   TempSrc: Oral Oral Oral   SpO2: 96% 95% 95%   Weight:      Height:        Intake/Output Summary (Last 24 hours) at 12/05/2019 1330 Last data filed at 12/05/2019 0600 Gross per 24 hour  Intake 2077.86 ml  Output 800 ml  Net 1277.86 ml   Filed Weights   12/03/19 2142 12/04/19 0400  Weight: 125.2 kg 123.6 kg    Examination:  General exam: Appears calm and comfortable  Respiratory system: Clear to auscultation. Respiratory effort normal. Cardiovascular system: S1 & S2 heard, RRR. No JVD, murmurs, rubs, gallops or clicks. No pedal edema. Gastrointestinal system: Abdomen is nondistended, soft and nontender. No organomegaly or masses  felt. Normal bowel sounds heard. Central nervous system: Alert and oriented. No focal neurological deficits. Extremities: Symmetric 5 x 5 power. Skin: No rashes, lesions or ulcers Psychiatry: Judgement and insight appear normal. Mood & affect appropriate.     Data Reviewed: I have personally reviewed following labs and imaging studies  CBC: Recent Labs  Lab 12/03/19 2109 12/03/19 2112 12/05/19 0400  WBC  --  13.8* 10.0  NEUTROABS  --  8.2*  --   HGB 12.2* 10.9* 10.0*  HCT 36.0* 36.3* 33.1*  MCV  --  89.4 88.7  PLT  --  393 673   Basic Metabolic Panel: Recent Labs  Lab 12/03/19 2109 12/03/19 2112 12/05/19 0400  NA 142 137 143  K 3.9 3.9 3.3*  CL 100 97* 105  CO2  --  25 30  GLUCOSE 74 74 78  BUN 30* 27* 16  CREATININE 1.30* 1.44* 1.12  CALCIUM  --  9.1 9.0  MG  --   --  2.1  PHOS  --   --  3.9   GFR: Estimated Creatinine Clearance: 83.3 mL/min (by C-G formula based on SCr of 1.12 mg/dL). Liver Function Tests: Recent Labs  Lab 12/03/19 2112  AST 41  ALT 39  ALKPHOS 78  BILITOT 0.5  PROT 7.2  ALBUMIN 2.8*   No results for input(s): LIPASE, AMYLASE in the last 168 hours. No results for input(s): AMMONIA in the last 168 hours. Coagulation  Profile: Recent Labs  Lab 12/03/19 2150  INR 1.2   Cardiac Enzymes: No results for input(s): CKTOTAL, CKMB, CKMBINDEX, TROPONINI in the last 168 hours. BNP (last 3 results) No results for input(s): PROBNP in the last 8760 hours. HbA1C: Recent Labs    12/04/19 0503  HGBA1C 7.6*   CBG: Recent Labs  Lab 12/04/19 1635 12/04/19 2110 12/05/19 0610 12/05/19 0758 12/05/19 1232  GLUCAP 161* 109* 70 92 112*   Lipid Profile: Recent Labs    12/04/19 0503  CHOL 96  HDL 32*  LDLCALC 52  TRIG 59  CHOLHDL 3.0   Thyroid Function Tests: No results for input(s): TSH, T4TOTAL, FREET4, T3FREE, THYROIDAB in the last 72 hours. Anemia Panel: No results for input(s): VITAMINB12, FOLATE, FERRITIN, TIBC, IRON, RETICCTPCT  in the last 72 hours. Sepsis Labs: No results for input(s): PROCALCITON, LATICACIDVEN in the last 168 hours.  Recent Results (from the past 240 hour(s))  SARS CORONAVIRUS 2 (TAT 6-24 HRS) Nasopharyngeal Nasopharyngeal Swab     Status: None   Collection Time: 12/04/19  1:00 AM   Specimen: Nasopharyngeal Swab  Result Value Ref Range Status   SARS Coronavirus 2 NEGATIVE NEGATIVE Final    Comment: (NOTE) SARS-CoV-2 target nucleic acids are NOT DETECTED. The SARS-CoV-2 RNA is generally detectable in upper and lower respiratory specimens during the acute phase of infection. Negative results do not preclude SARS-CoV-2 infection, do not rule out co-infections with other pathogens, and should not be used as the sole basis for treatment or other patient management decisions. Negative results must be combined with clinical observations, patient history, and epidemiological information. The expected result is Negative. Fact Sheet for Patients: HairSlick.no Fact Sheet for Healthcare Providers: quierodirigir.com This test is not yet approved or cleared by the [place] FDA and  has been authorized for detection and/or diagnosis of SARS-CoV-2 by FDA under an Emergency Use Authorization (EUA). This EUA will remain  in effect (meaning this test can be used) for the duration of the COVID-19 declaration under Section 56 4(b)(1) of the Act, 21 U.S.C. section 360bbb-3(b)(1), unless the authorization is terminated or revoked sooner. Performed at [facility], [address], [place], [place] [address]      Radiology Studies: CT Code Stroke CTA Head W/WO contrast  Result Date: 12/03/2019 CLINICAL DATA:  Left-sided weakness. EXAM: CT ANGIOGRAPHY HEAD AND NECK TECHNIQUE: Multidetector CT imaging of the head and neck was performed using the standard protocol during bolus administration of intravenous contrast. Multiplanar CT image  reconstructions and MIPs were obtained to evaluate the vascular anatomy. Carotid stenosis measurements (when applicable) are obtained utilizing NASCET criteria, using the distal internal carotid diameter as the denominator. CONTRAST:  72mL OMNIPAQUE IOHEXOL 350 MG/ML SOLN COMPARISON:  CT head without contrast 12/03/2019 FINDINGS: CTA NECK FINDINGS Aortic arch: A 3 vessel arch configuration is present. Atherosclerotic calcifications are present without aneurysm or stenosis. Right carotid system: Atherosclerotic calcifications are present the wall of distal right common carotid artery without significant stenosis. Atherosclerotic calcifications are present at the bifurcation. Mixed density plaque is noted laterally without significant stenosis. Minimal luminal diameter is 2.5 mm. Left carotid system: The left common carotid artery also demonstrates some distal mural calcifications. Calcifications are present at the bifurcation without significant stenosis. Distal cervical left ICA is normal. Vertebral arteries: The vertebral arteries are codominant. High-grade stenosis is present at the origin of the left vertebral artery. A 50% narrowing is present the origin of the right vertebral artery. No additional stenoses are  present in either vertebral artery in the neck. Skeleton: Acquired fusion is present across the disc space at C3-4 fusion is also noted across the disc space at C6-7. Vertebral body heights and alignment are maintained. No focal lytic or blastic lesions are present. Other neck: Soft tissues of the neck are unremarkable. Upper chest: Prominent left pleural effusion is present. Associated atelectasis is present. Right lung is clear. Subcentimeter mediastinal nodes are likely reactive. Review of the MIP images confirms the above findings CTA HEAD FINDINGS Anterior circulation: Atherosclerotic calcifications are present supraclinoid internal carotid arteries bilaterally without a significant stenosis relative  to the ICA termini. The A1 and M1 segments are normal. MCA bifurcations intact. The ACA and MCA branch vessels are within normal limits. Posterior circulation: Left vertebral artery is dominant. PICA origins are visualized and. The vertebrobasilar junction is within normal limits. Basilar artery is. Both posterior cerebral arteries originate basilar tip. A prominent right posterior communicating artery contributes. Venous sinuses: The dural sinuses are patent. The straight sinus deep cerebral veins intact. Cortical veins are unremarkable. Anatomic variants: Prominent right posterior communicating artery. Review of the MIP images confirms the above findings IMPRESSION: 1. No emergent large vessel occlusion 2. Atherosclerotic changes in the distal common carotid arteries and at the carotid bifurcations bilaterally significant stenosis. 3.  Aortic Atherosclerosis (ICD10-I70.0). 4. Degenerative changes of the cervical spine including fusion at C3-4 and C6-7. These results were called by telephone at the time of interpretation on 12/03/2019 at 9:35pm to provider ERIC [facility] , who verbally acknowledged these results. Electronically Signed   By: Marin Roberts M.D.   On: 12/03/2019 21:41   CT Code Stroke CTA Neck W/WO contrast  Result Date: 12/03/2019 CLINICAL DATA:  Left-sided weakness. EXAM: CT ANGIOGRAPHY HEAD AND NECK TECHNIQUE: Multidetector CT imaging of the head and neck was performed using the standard protocol during bolus administration of intravenous contrast. Multiplanar CT image reconstructions and MIPs were obtained to evaluate the vascular anatomy. Carotid stenosis measurements (when applicable) are obtained utilizing NASCET criteria, using the distal internal carotid diameter as the denominator. CONTRAST:  75mL OMNIPAQUE IOHEXOL 350 MG/ML SOLN COMPARISON:  CT head without contrast 12/03/2019 FINDINGS: CTA NECK FINDINGS Aortic arch: A 3 vessel arch configuration is present. Atherosclerotic  calcifications are present without aneurysm or stenosis. Right carotid system: Atherosclerotic calcifications are present the wall of distal right common carotid artery without significant stenosis. Atherosclerotic calcifications are present at the bifurcation. Mixed density plaque is noted laterally without significant stenosis. Minimal luminal diameter is 2.5 mm. Left carotid system: The left common carotid artery also demonstrates some distal mural calcifications. Calcifications are present at the bifurcation without significant stenosis. Distal cervical left ICA is normal. Vertebral arteries: The vertebral arteries are codominant. High-grade stenosis is present at the origin of the left vertebral artery. A 50% narrowing is present the origin of the right vertebral artery. No additional stenoses are present in either vertebral artery in the neck. Skeleton: Acquired fusion is present across the disc space at C3-4 fusion is also noted across the disc space at C6-7. Vertebral body heights and alignment are maintained. No focal lytic or blastic lesions are present. Other neck: Soft tissues of the neck are unremarkable. Upper chest: Prominent left pleural effusion is present. Associated atelectasis is present. Right lung is clear. Subcentimeter mediastinal nodes are likely reactive. Review of the MIP images confirms the above findings CTA HEAD FINDINGS Anterior circulation: Atherosclerotic calcifications are present supraclinoid internal carotid arteries bilaterally without a significant stenosis relative  to the ICA termini. The A1 and M1 segments are normal. MCA bifurcations intact. The ACA and MCA branch vessels are within normal limits. Posterior circulation: Left vertebral artery is dominant. PICA origins are visualized and. The vertebrobasilar junction is within normal limits. Basilar artery is. Both posterior cerebral arteries originate basilar tip. A prominent right posterior communicating artery contributes.  Venous sinuses: The dural sinuses are patent. The straight sinus deep cerebral veins intact. Cortical veins are unremarkable. Anatomic variants: Prominent right posterior communicating artery. Review of the MIP images confirms the above findings IMPRESSION: 1. No emergent large vessel occlusion 2. Atherosclerotic changes in the distal common carotid arteries and at the carotid bifurcations bilaterally significant stenosis. 3.  Aortic Atherosclerosis (ICD10-I70.0). 4. Degenerative changes of the cervical spine including fusion at C3-4 and C6-7. These results were called by telephone at the time of interpretation on 12/03/2019 at 9:35pm to provider ERIC [facility] , who verbally acknowledged these results. Electronically Signed   By: Marin Roberts M.D.   On: 12/03/2019 21:41   MR ANGIO HEAD WO CONTRAST  Result Date: 12/04/2019 CLINICAL DATA:  TIA. Left-sided weakness and facial droop. EXAM: MRI HEAD WITHOUT CONTRAST MRA HEAD WITHOUT CONTRAST TECHNIQUE: Multiplanar, multiecho pulse sequences of the brain and surrounding structures were obtained without intravenous contrast. Angiographic images of the head were obtained using MRA technique without contrast. COMPARISON:  Head CT and CTA 12/03/2019 FINDINGS: MRI HEAD FINDINGS Brain: There is a small acute infarct involving the right corona radiata and posterior right lentiform nucleus. T2 hyperintensities in the cerebral white matter bilaterally are nonspecific but compatible with mild chronic small vessel ischemic disease. Mild cerebral atrophy is within normal limits for age. No intracranial hemorrhage, mass, midline shift, or extra-axial fluid collection is identified. Vascular: Major intracranial vascular flow voids are preserved. Skull and upper cervical spine: Decreased bone marrow T1 signal intensity in the skull and included upper cervical spine, nonspecific but may be related to the patient's known anemia. Sinuses/Orbits: Unremarkable orbits. Mild mucosal  thickening in the paranasal sinuses. Trace left mastoid fluid. Other: None. MRA HEAD FINDINGS The visualized distal vertebral arteries are patent to the basilar with a fenestration partially visualized in the right vertebral artery, a normal variant. The left PICA and right AICA appear dominant. Patent SCAs are seen bilaterally. The basilar artery is widely patent. There is a moderately large right posterior communicating artery. Both PCAs are patent without evidence of significant proximal stenosis. The internal carotid arteries are widely patent from skull base to carotid termini. ACAs and MCAs are patent without evidence of proximal branch occlusion or significant proximal stenosis. No aneurysm is identified. IMPRESSION: 1. 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 2. Mild chronic small vessel ischemic disease. 3. Negative head MRA. Electronically Signed   By: Sebastian Ache M.D.   On: 12/04/2019 09:37   MR BRAIN WO CONTRAST  Result Date: 12/04/2019 CLINICAL DATA:  TIA. Left-sided weakness and facial droop. EXAM: MRI HEAD WITHOUT CONTRAST MRA HEAD WITHOUT CONTRAST TECHNIQUE: Multiplanar, multiecho pulse sequences of the brain and surrounding structures were obtained without intravenous contrast. Angiographic images of the head were obtained using MRA technique without contrast. COMPARISON:  Head CT and CTA 12/03/2019 FINDINGS: MRI HEAD FINDINGS Brain: There is a small acute infarct involving the right corona radiata and posterior right lentiform nucleus. T2 hyperintensities in the cerebral white matter bilaterally are nonspecific but compatible with mild chronic small vessel ischemic disease. Mild cerebral atrophy is within normal limits for age. No intracranial  hemorrhage, mass, midline shift, or extra-axial fluid collection is identified. Vascular: Major intracranial vascular flow voids are preserved. Skull and upper cervical spine: Decreased bone marrow T1 signal intensity in the  skull and included upper cervical spine, nonspecific but may be related to the patient's known anemia. Sinuses/Orbits: Unremarkable orbits. Mild mucosal thickening in the paranasal sinuses. Trace left mastoid fluid. Other: None. MRA HEAD FINDINGS The visualized distal vertebral arteries are patent to the basilar with a fenestration partially visualized in the right vertebral artery, a normal variant. The left PICA and right AICA appear dominant. Patent SCAs are seen bilaterally. The basilar artery is widely patent. There is a moderately large right posterior communicating artery. Both PCAs are patent without evidence of significant proximal stenosis. The internal carotid arteries are widely patent from skull base to carotid termini. ACAs and MCAs are patent without evidence of proximal branch occlusion or significant proximal stenosis. No aneurysm is identified. IMPRESSION: 1. 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 2. Mild chronic small vessel ischemic disease. 3. Negative head MRA. Electronically Signed   By: Sebastian Ache M.D.   On: 12/04/2019 09:37   DG CHEST PORT 1 VIEW  Result Date: 12/04/2019 CLINICAL DATA:  Left-sided weakness and facial droop EXAM: PORTABLE CHEST 1 VIEW COMPARISON:  09/19/2019 FINDINGS: Single frontal view of the chest was performed. Cardiac silhouette is stable. There is veiling opacity at the left lung base, new since prior study. No pneumothorax. No acute bony abnormalities. IMPRESSION: 1. Left basilar veiling opacity consistent with consolidation and/or effusion. Electronically Signed   By: Sharlet Salina M.D.   On: 12/04/2019 19:13   ECHOCARDIOGRAM COMPLETE  Result Date: 12/04/2019    ECHOCARDIOGRAM REPORT   Patient Name:   DELANDO SATTER Date of Exam: 12/04/2019 Medical Rec #:  [id number]        Height:       74.0 in Accession #:    [id number]       Weight:       272.5 lb Date of Birth:  1945/09/05        BSA:          2.479 m Patient Age:    73 years          BP:           104/79 mmHg Patient Gender: M                HR:           70 bpm. Exam Location:  Inpatient Procedure: 2D Echo, Cardiac Doppler and Color Doppler Indications:    TIA 435.9 / G45.9  History:        Patient has prior history of Echocardiogram examinations, most                 recent 09/20/2019. Risk Factors:Hypertension, Diabetes and                 Dyslipidemia. Cerebral embolism with cerebral                 infarction,Paroxysmal atrial fibrillation,Obesity.  Sonographer:    Celesta Gentile RCS Referring Phys: [id number] Deno Lunger [facility] IMPRESSIONS  1. Left ventricular ejection fraction, by estimation, is 55 to 60%. The left ventricle has normal function. The left ventricle has no regional wall motion abnormalities. The left ventricular internal cavity size was mildly dilated. There is mild concentric left ventricular hypertrophy. Left ventricular diastolic parameters are consistent with Grade III diastolic dysfunction (restrictive).  Elevated left ventricular end-diastolic pressure.  2. Right ventricular systolic function is normal. The right ventricular size is mildly enlarged.  3. Left atrial size was mildly dilated.  4. Right atrial size was mild to moderately dilated.  5. The mitral valve is grossly normal. Trivial mitral valve regurgitation. No evidence of mitral stenosis.  6. The aortic valve is tricuspid. Aortic valve regurgitation is mild to moderate. Moderate aortic valve stenosis.  7. The inferior vena cava is dilated in size with >50% respiratory variability, suggesting right atrial pressure of 8 mmHg. Conclusion(s)/Recommendation(s): Elevated LVEDP, restrictive diastolic filling, moderate aortic stenosis with mild-moderate aortic regurgitation. FINDINGS  Left Ventricle: Left ventricular ejection fraction, by estimation, is 55 to 60%. The left ventricle has normal function. The left ventricle has no regional wall motion abnormalities. The left ventricular internal cavity size was mildly  dilated. There is  mild concentric left ventricular hypertrophy. Left ventricular diastolic parameters are consistent with Grade III diastolic dysfunction (restrictive). Elevated left ventricular end-diastolic pressure. Right Ventricle: The right ventricular size is mildly enlarged. No increase in right ventricular wall thickness. Right ventricular systolic function is normal. Left Atrium: Left atrial size was mildly dilated. Right Atrium: Right atrial size was mild to moderately dilated. Pericardium: Trivial pericardial effusion is present. Presence of pericardial fat pad. Mitral Valve: The mitral valve is grossly normal. Trivial mitral valve regurgitation. No evidence of mitral valve stenosis. Tricuspid Valve: The tricuspid valve is grossly normal. Tricuspid valve regurgitation is trivial. No evidence of tricuspid stenosis. Aortic Valve: The aortic valve is tricuspid. . There is moderate thickening and moderate calcification of the aortic valve. Aortic valve regurgitation is mild to moderate. Aortic regurgitation PHT measures 476 msec. Moderate aortic stenosis is present. There is moderate thickening of the aortic valve. There is moderate calcification of the aortic valve. Aortic valve mean gradient measures 30.5 mmHg. Aortic valve peak gradient measures 48.0 mmHg. Aortic valve area, by VTI measures 1.09 cm. Pulmonic Valve: The pulmonic valve was not well visualized. Pulmonic valve regurgitation is not visualized. No evidence of pulmonic stenosis. Aorta: The aortic root and ascending aorta are structurally normal, with no evidence of dilitation. Venous: The inferior vena cava is dilated in size with greater than 50% respiratory variability, suggesting right atrial pressure of 8 mmHg. IAS/Shunts: The atrial septum is grossly normal. Additional Comments: There is a small pleural effusion.  LEFT VENTRICLE PLAX 2D LVIDd:         5.50 cm  Diastology LVIDs:         3.61 cm  LV e' lateral:   7.18 cm/s LV PW:          1.20 cm  LV E/e' lateral: 21.6 LV IVS:        1.20 cm  LV e' medial:    5.77 cm/s LVOT diam:     1.90 cm  LV E/e' medial:  26.9 LV SV:         100 LV SV Index:   40 LVOT Area:     2.84 cm  RIGHT VENTRICLE RV S prime:     13.50 cm/s TAPSE (M-mode): 1.9 cm LEFT ATRIUM             Index       RIGHT ATRIUM           Index LA diam:        4.50 cm 1.82 cm/m  RA Area:     31.00 cm LA Vol (A2C):   95.4 ml 38.49 ml/m  RA Volume:   115.00 ml 46.39 ml/m LA Vol (A4C):   72.0 ml 29.05 ml/m LA Biplane Vol: 83.0 ml 33.48 ml/m  AORTIC VALVE AV Area (Vmax):    1.13 cm AV Area (Vmean):   1.01 cm AV Area (VTI):     1.09 cm AV Vmax:           346.50 cm/s AV Vmean:          261.000 cm/s AV VTI:            0.915 m AV Peak Grad:      48.0 mmHg AV Mean Grad:      30.5 mmHg LVOT Vmax:         138.00 cm/s LVOT Vmean:        92.800 cm/s LVOT VTI:          0.352 m LVOT/AV VTI ratio: 0.38 AI PHT:            476 msec  AORTA Ao Root diam: 3.30 cm MITRAL VALVE MV Area (PHT): 2.97 cm     SHUNTS MV Decel Time: 256 msec     Systemic VTI:  0.35 m MV E velocity: 155.00 cm/s  Systemic Diam: 1.90 cm MV A velocity: 94.60 cm/s MV E/A ratio:  1.64 Jodelle Red MD Electronically signed by Jodelle Red MD Signature Date/Time: 12/04/2019/4:02:44 PM    Final    CT HEAD CODE STROKE WO CONTRAST  Result Date: 12/03/2019 CLINICAL DATA:  Code stroke.  Facial droop and left-sided weakness. EXAM: CT HEAD WITHOUT CONTRAST TECHNIQUE: Contiguous axial images were obtained from the base of the skull through the vertex without intravenous contrast. COMPARISON:  None. FINDINGS: Brain: Mild generalized atrophy and white matter disease is present. No acute infarct, hemorrhage, or mass lesion is present. Basal ganglia are intact. Insular ribbon is normal bilaterally. No acute or focal cortical abnormality is present. The ventricles are of normal size. No significant extraaxial fluid collection is present. The brainstem and cerebellum are within  normal limits. Vascular: Atherosclerotic changes are present within the cavernous internal carotid arteries. No hyperdense vessel is present. Skull: Calvarium is intact. No focal lytic or blastic lesions are present. No significant extracranial soft tissue lesion is present. Sinuses/Orbits: Mucosal scratched at circumferential mucosal thickening is present in the maxillary sinuses, left greater than right. No fluid levels are present. Posterior left ethmoid air cells opacified. Mucosal thickening is present in the inferior frontal sinuses bilaterally. Mastoid air cells are clear. Globes and orbits are within normal limits. ASPECTS [facility] Stroke Program Early CT Score) - Ganglionic level infarction (caudate, lentiform nuclei, internal capsule, insula, M1-M3 cortex): 7/7 - Supraganglionic infarction (M4-M6 cortex): 3/3 Total score (0-10 with 10 being normal): 10/10 IMPRESSION: 1. No acute intracranial abnormality. 2. Mild generalized atrophy and white matter disease. This likely reflects the sequela of chronic microvascular ischemia. 3. ASPECTS is 10/10. 4. Mild sinus disease. The above was relayed via text pager to Dr. Otelia Limes on 12/03/2019 at 21:20 . Electronically Signed   By: Marin Roberts M.D.   On: 12/03/2019 21:20     Scheduled Meds:  amiodarone  200 mg Oral Daily   apixaban  5 mg Oral BID   aspirin EC  81 mg Oral Daily   atorvastatin  80 mg Oral Daily   furosemide  40 mg Oral Daily   insulin aspart  0-15 Units Subcutaneous TID AC & HS   insulin aspart protamine- aspart  20 Units Subcutaneous BID WC   pantoprazole  40 mg Oral Daily   sertraline  100 mg Oral Daily   Continuous Infusions:  sodium chloride 75 mL/hr at 12/05/19 0600     LOS: 1 day    Time spent: 25 mins.    Cipriano Bunker, MD [facility]   If 7PM-7AM, please contact night-coverage

## 2019-12-05 NOTE — NC FL2 (Signed)
Opp LEVEL OF CARE SCREENING TOOL     IDENTIFICATION  Patient Name: Brandon Sanchez Birthdate: [birth year] Sex: male Admission Date (Current Location): 12/03/2019  [facility] and [place] Number:  Herbalist and Address:  The . [facility], [place] [address], [place], [place] [address]      Provider Number: [id number]  Attending Physician Name and Address:  Shawna Clamp, MD  Relative Name and Phone Number:       Current Level of Care: Hospital Recommended Level of Care: [place] Prior Approval Number:    Date Approved/Denied:   PASRR Number: [id number]  Discharge Plan: SNF    Current Diagnoses: Patient Active Problem List   Diagnosis Date Noted  . Cerebral embolism with cerebral infarction 12/04/2019  . CVA (cerebral vascular accident) ([place]) 12/04/2019  . Acute left-sided weakness 12/03/2019  . Mixed diabetic hyperlipidemia associated with type 2 diabetes mellitus ([place]) 12/03/2019  . Paroxysmal atrial fibrillation (HCC)   . Secondary hypercoagulable state ([place]) 09/28/2019  . Atrial fibrillation with rapid ventricular response ([place]) 09/20/2019  . CAP (community acquired pneumonia) 09/20/2019  . Obesity 05/12/2015  . Uncontrolled type 2 diabetes mellitus with hypoglycemia without coma, with long-term current use of insulin ([place]) 02/04/2008  . HLD (hyperlipidemia) 02/04/2008  . Essential hypertension 02/04/2008  . ALLERGIC RHINITIS 02/04/2008  . 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 48/54/6270    Orientation RESPIRATION BLADDER Height & Weight     Self, Time, Situation, Place  Normal Incontinent Weight: 272 lb 7.8 oz (123.6 kg) Height:  6\' 2"  (188 cm)  BEHAVIORAL SYMPTOMS/MOOD NEUROLOGICAL BOWEL NUTRITION STATUS      Continent Diet(see DC summary)  AMBULATORY STATUS COMMUNICATION OF NEEDS Skin   Extensive Assist Verbally Normal                       Personal Care Assistance Level  of Assistance  Bathing, Feeding, Dressing Bathing Assistance: Maximum assistance Feeding assistance: Limited assistance Dressing Assistance: Maximum assistance     Functional Limitations Info             SPECIAL CARE FACTORS FREQUENCY  PT (By licensed PT), OT (By licensed OT)     PT Frequency: 5x/wk OT Frequency: 5x/wk            Contractures Contractures Info: Not present    Additional Factors Info  Code Status, Allergies, Psychotropic, Insulin Sliding Scale Code Status Info: Full Allergies Info: NKA Psychotropic Info: Zoloft 100mg  daily Insulin Sliding Scale Info: 0-15 units 3x/day before meals and at bedtime; 70/30 20 units 2x/day with meals       Current Medications (12/05/2019):  This is the current hospital active medication list Current Facility-Administered Medications  Medication Dose Route Frequency Provider Last Rate Last Admin  . 0.9 %  sodium chloride infusion   Intravenous Continuous Rosalin Hawking, MD 75 mL/hr at 12/05/19 0600 Rate Verify at 12/05/19 0600  . acetaminophen (TYLENOL) tablet 650 mg  650 mg Oral Q4H PRN Shalhoub, Sherryll Burger, MD       Or  . acetaminophen (TYLENOL) 160 MG/5ML solution 650 mg  650 mg Per Tube Q4H PRN Shalhoub, Sherryll Burger, MD       Or  . acetaminophen (TYLENOL) suppository 650 mg  650 mg Rectal Q4H PRN Shalhoub, Sherryll Burger, MD      . albuterol (PROVENTIL) (2.5 MG/3ML) 0.083% nebulizer solution 3 mL  3 mL Inhalation Q4H PRN Shalhoub, Sherryll Burger, MD      .  amiodarone (PACERONE) tablet 200 mg  200 mg Oral Daily Shalhoub, Deno Lunger, MD   200 mg at 12/05/19 0920  . apixaban (ELIQUIS) tablet 5 mg  5 mg Oral BID Marinda Elk, MD   5 mg at 12/05/19 0920  . aspirin EC tablet 81 mg  81 mg Oral Daily Marvel Plan, MD   81 mg at 12/05/19 0920  . atorvastatin (LIPITOR) tablet 80 mg  80 mg Oral Daily Marinda Elk, MD   80 mg at 12/05/19 0920  . fluticasone furoate-vilanterol (BREO ELLIPTA) 200-25 MCG/INH 1 puff  1 puff Inhalation Daily PRN  Shalhoub, Deno Lunger, MD      . furosemide (LASIX) tablet 40 mg  40 mg Oral Daily Marvel Plan, MD   40 mg at 12/05/19 0920  . insulin aspart (novoLOG) injection 0-15 Units  0-15 Units Subcutaneous TID AC & HS Shalhoub, Deno Lunger, MD   3 Units at 12/04/19 1732  . insulin aspart protamine- aspart (NOVOLOG MIX 70/30) injection 20 Units  20 Units Subcutaneous BID WC Marvel Plan, MD      . ondansetron [facility]) injection 4 mg  4 mg Intravenous Q6H PRN Shalhoub, Deno Lunger, MD      . pantoprazole (PROTONIX) EC tablet 40 mg  40 mg Oral Daily Shalhoub, Deno Lunger, MD      . polyethylene glycol (MIRALAX / GLYCOLAX) packet 17 g  17 g Oral Daily PRN Shalhoub, Deno Lunger, MD      . sertraline (ZOLOFT) tablet 100 mg  100 mg Oral Daily Shalhoub, Deno Lunger, MD   100 mg at 12/05/19 0920     Discharge Medications: Please see discharge summary for a list of discharge medications.  Relevant Imaging Results:  Relevant Lab Results:   Additional Information SS#: [id number]  Baldemar Lenis, LCSW

## 2019-12-06 ENCOUNTER — Encounter (HOSPITAL_COMMUNITY): Payer: Self-pay | Admitting: Family Medicine

## 2019-12-06 ENCOUNTER — Encounter (HOSPITAL_BASED_OUTPATIENT_CLINIC_OR_DEPARTMENT_OTHER): Payer: Self-pay | Admitting: Cardiovascular Disease

## 2019-12-06 ENCOUNTER — Telehealth: Payer: Self-pay | Admitting: Cardiology

## 2019-12-06 DIAGNOSIS — I5032 Chronic diastolic (congestive) heart failure: Secondary | ICD-10-CM

## 2019-12-06 DIAGNOSIS — R531 Weakness: Secondary | ICD-10-CM

## 2019-12-06 LAB — GLUCOSE, CAPILLARY
Glucose-Capillary: 72 mg/dL (ref 70–99)
Glucose-Capillary: 116 mg/dL — ABNORMAL HIGH (ref 70–99)
Glucose-Capillary: 113 mg/dL — ABNORMAL HIGH (ref 70–99)
Glucose-Capillary: 94 mg/dL (ref 70–99)

## 2019-12-06 LAB — CBC
HCT: 31.2 % — ABNORMAL LOW (ref 39.0–52.0)
Hemoglobin: 9.6 g/dL — ABNORMAL LOW (ref 13.0–17.0)
MCH: 27.2 pg (ref 26.0–34.0)
MCHC: 30.8 g/dL (ref 30.0–36.0)
MCV: 88.4 fL (ref 80.0–100.0)
Platelets: 354 10*3/uL (ref 150–400)
RBC: 3.53 MIL/uL — ABNORMAL LOW (ref 4.22–5.81)
RDW: 14.6 % (ref 11.5–15.5)
WBC: 10.1 10*3/uL (ref 4.0–10.5)
nRBC: 0 % (ref 0.0–0.2)

## 2019-12-06 LAB — BASIC METABOLIC PANEL
Anion gap: 11 (ref 5–15)
BUN: 16 mg/dL (ref 8–23)
CO2: 29 mmol/L (ref 22–32)
Calcium: 9 mg/dL (ref 8.9–10.3)
Chloride: 104 mmol/L (ref 98–111)
Creatinine, Ser: 1.29 mg/dL — ABNORMAL HIGH (ref 0.61–1.24)
GFR calc Af Amer: >60 mL/min (ref >60)
GFR calc non Af Amer: 55 mL/min — ABNORMAL LOW (ref >60)
Glucose, Bld: 75 mg/dL (ref 70–99)
Potassium: 3.7 mmol/L (ref 3.5–5.1)
Sodium: 144 mmol/L (ref 135–145)

## 2019-12-06 LAB — MAGNESIUM: Magnesium: 2.1 mg/dL (ref 1.7–2.4)

## 2019-12-06 LAB — PHOSPHORUS: Phosphorus: 4.2 mg/dL (ref 2.5–4.6)

## 2019-12-06 NOTE — Plan of Care (Signed)
  Problem: Nutrition: Goal: Adequate nutrition will be maintained Outcome: Progressing   Problem: Safety: Goal: Ability to remain free from injury will improve Outcome: Progressing   

## 2019-12-06 NOTE — Telephone Encounter (Signed)
Attempted to call patient's daughter back, left voicemail to notify that message will be sent to Dr. Cristal Deer and nurse as an Lorain Childes. Unable to validate that metoprolol has been discontinued and that Lasix has been decreased in order to update medication list.

## 2019-12-06 NOTE — Progress Notes (Signed)
Physical Therapy Treatment Patient Details Name: Brandon Sanchez MRN: [id number] DOB: September 24, 1945 Today's Date: 12/06/2019    History of Present Illness 74 year old male with past medical history of diabetes mellitus type 2, paroxysmal atrial fibrillation, hyperlipidemia, hypertension who presents to [facility] emergency department with left-sided weakness and left facial droop.. Small acute posterior right basal ganglia/corona radiata infarct    PT Comments    Pt in bed upon arrival of PT, agreeable to PT session with focus on dynamic stability. The pt was able to demo improvements in bed mobility and was able to stand with single UE support to allow for the taking of orthostatic vitals (reported below). We utilized the stedy during today's session to maximize safety with taking orthostatics, but then used the stedy to provide increased security with standing and reaching balance exercises at the sink with use of the mirror for visual feedback. The pt was able to demo sig improvements during today's session, but will continue to benefit from skilled PT to further progress functional transfers and ambulation prior to d/c.     Follow Up Recommendations  CIR;Supervision/Assistance - 24 hour     Equipment Recommendations  (defer to post acute)    Recommendations for Other Services Rehab consult     Precautions / Restrictions Precautions Precautions: Fall Precaution Comments: L hemiplegia Restrictions Weight Bearing Restrictions: No    Mobility  Bed Mobility Overal bed mobility: Needs Assistance Bed Mobility: Rolling;Sidelying to Sit Rolling: Min assist Sidelying to sit: Min guard       General bed mobility comments: pt able to initiate movement of BLE to EOB with minA to complete, then minG to raise trunk from elevated HOB with sig use of bed rail.  Transfers Overall transfer level: Needs assistance Equipment used: Ambulation equipment used Transfers: Sit to/from  [employer] Sit to Stand: Min assist   Squat pivot transfers: Total assist     General transfer comment: use of stedy during today's session for safety due to MD request for orthostatics, pt able to stand from bed with minA and use of stedy as well as minG for future stand-sit from stedy wings. Recommend continued transfer training with RW  Ambulation/Gait                 Stairs             Wheelchair Mobility    Modified Rankin (Stroke Patients Only) Modified Rankin (Stroke Patients Only) Pre-Morbid Rankin Score: Moderate disability Modified Rankin: Moderately severe disability     Balance Overall balance assessment: Needs assistance Sitting-balance support: Single extremity supported;Feet supported Sitting balance-Leahy Scale: Fair Sitting balance - Comments: sig improvement in midline today with only VC, supervision Postural control: Posterior lean;Left lateral lean   Standing balance-Leahy Scale: Poor Standing balance comment: reliant on BUE support and external support               High Level Balance Comments: Pt practiced multiple reaching balance tasks while standing in stedy at sink with mirror for visual feedback and bilateral reaching  for improved wt shifting bilaterally and standing balance.            Cognition Arousal/Alertness: Awake/alert Behavior During Therapy: WFL for tasks assessed/performed Overall Cognitive Status: Within Functional Limits for tasks assessed                                 General Comments: Pt with  improved engagement and motivation today      Exercises      General Comments General comments (skin integrity, edema, etc.): BP supine: 126/56; BP sitting: 109/50; BP standing: 95/50 with pt reporting slight sx      Pertinent Vitals/Pain Pain Assessment: Faces Faces Pain Scale: Hurts little more Pain Location: B knees with sit-stand transition Pain Descriptors / Indicators:  Grimacing;Sore Pain Intervention(s): Limited activity within patient's tolerance;Monitored during session    Home Living                      Prior Function            PT Goals (current goals can now be found in the care plan section) Acute Rehab PT Goals Patient Stated Goal: to get stronger PT Goal Formulation: With patient Time For Goal Achievement: 12/18/19 Potential to Achieve Goals: Good Progress towards PT goals: Progressing toward goals    Frequency    Min 4X/week      PT Plan Current plan remains appropriate    Co-evaluation              AM-PAC PT "6 Clicks" Mobility   Outcome Measure  Help needed turning from your back to your side while in a flat bed without using bedrails?: A Little Help needed moving from lying on your back to sitting on the side of a flat bed without using bedrails?: A Little Help needed moving to and from a bed to a chair (including a wheelchair)?: A Lot Help needed standing up from a chair using your arms (e.g., wheelchair or bedside chair)?: A Lot Help needed to walk in hospital room?: A Lot Help needed climbing 3-5 steps with a railing? : Total 6 Click Score: 13    End of Session Equipment Utilized During Treatment: Gait belt Activity Tolerance: Patient tolerated treatment well Patient left: with call bell/phone within reach;in chair;with chair alarm set Nurse Communication: Mobility status PT Visit Diagnosis: Difficulty in walking, not elsewhere classified (R26.2);Muscle weakness (generalized) (M62.81);Hemiplegia and hemiparesis Hemiplegia - Right/Left: Left Hemiplegia - dominant/non-dominant: Non-dominant Hemiplegia - caused by: Cerebral infarction     Time: 1349-1440 PT Time Calculation (min) (ACUTE ONLY): 51 min  Charges:  $Therapeutic Activity: 8-22 mins $Neuromuscular Re-education: 23-37 mins                     Brandon Sanchez, DPT   Acute Rehabilitation Department Pager #: [phone]   Brandon Sanchez 12/06/2019, 3:57 PM

## 2019-12-06 NOTE — Consult Note (Signed)
   [facility] Inpatient Consult   12/06/2019  Brandon Sanchez 1946-04-28 [id number]   Patient is currently active with [facility] [[facility]] Care Management for chronic disease management services.  Patient has been engaged by a [facility] RN Care Management Coordinator.  Our community based plan of care has focused on disease management and community resource support.   Chart review of Physical Therapy recommendation is for a CIR [Comprehensive Inpatient Rehab] level of care for transition from acute hospital stay noted.  Per MD written not 12/06/2019 which includes but not limited to  as follow:  Patient found to have small acute posterior right basal ganglia/corona radiata infarct.  Plan: Will follow Inpatient Transition Of Care [TOC] team member to make aware that [facility] Care Management following and follow for disposition/transition.   Of note, [facility] Care Management services does not replace or interfere with any services that are needed or arranged by inpatient [facility] care management team.  For additional questions or referrals please contact:  Charlesetta Shanks, RN BSN CCM [facility]  [phone] business mobile phone Toll free office [phone]  Fax number: [phone] [email or web address]

## 2019-12-06 NOTE — Progress Notes (Signed)
Inpatient Rehabilitation-Admissions Coordinator    I met with pt at the bedside as follow up from PM&R consult. (please see Dr. Aline August consult note on 4/19 for details). AC discussed recommended program, program details and expectations, anticipated LOS, and expected functional outcomes. I have confirmed DC support with his daughter and confirmed that both the pt and his daughter prefer CIR at this time. AC will follow up tomorrow for possible admit, pending bed availability.   Raechel Ache, OTR/L  Rehab Admissions Coordinator  [phone] 12/06/2019 5:19 PM

## 2019-12-06 NOTE — Telephone Encounter (Signed)
Patient's daughter calling to inform Dr. Cristal Deer that the patient is in the hospital and will be transferred to rehab for a few weeks and that his medications have changed. She states he had a minor stroke and the hospital took him off the metoprolol and lowered his furosemide to 40mg  a day.

## 2019-12-06 NOTE — Consult Note (Signed)
Physical Medicine and Rehabilitation Consult   Reason for Consult: Functional decline Referring Physician: Dr. Lucianne MussKumar    HPI: Brandon Sanchez is a 74 y.o. RH-male with history of T2DM, HTN, cataracts-  blurry vision, alcohol abuse--quit 08/2019, A fib-on Eliquis s/p cardioversion 10/18/19; who was admitted on 12/03/19 with acute onset of left sided weakness with left facial droop and dysarthria. CT head negative for acute changes. CTA head/neck was negative for LVO, atherosclerotic changes distal CCA and at carotid bifurcation, 50% stenosis L-VA  and showed degenerative change cervical spine with fusion at C3/4 and C6/7.  MRI/MRA brain showed small acute right basal ganglia/CR infarct and MRA negative. 2D echo showed EF 55-60% with moderate AS and mild concentric LVH with grade III DD. Dr. 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 .   Patient has had fluctuating left sided symptoms due to hypotension--metoprolol d/c and Lasix dose decreased to avoid hypoperfusion.  Cognitive eval done and WNL. He continues to be limited by left sided weakness but activity tolerance improving and CIR recommended due to functional decline.    Review of Systems  HENT: Positive for hearing loss.   Eyes: Positive for blurred vision (bilateral eyes --needs cataracts surgery).  Cardiovascular: Negative for chest pain and palpitations.  Musculoskeletal: Positive for joint pain (bilateral knees--RLE weaker. Hip pain). Negative for back pain.  Neurological: Positive for focal weakness.    Past Medical History:  Diagnosis Date  . Allergic rhinitis, cause unspecified   . Asthma   . Cataract cortical, senile, bilateral   . Macular degeneration of left eye   . Other and unspecified hyperlipidemia   . Type II or unspecified type diabetes mellitus without mention of complication, not stated as uncontrolled   .  Unspecified essential hypertension     Past Surgical History:  Procedure Laterality Date  . CARDIOVERSION N/A 09/22/2019   Procedure: CARDIOVERSION;  Surgeon: Little IshikawaSchumann, Christopher L, MD;  [facility];  Service: Endoscopy;  Laterality: N/A;  . CARDIOVERSION N/A 10/18/2019   Procedure: CARDIOVERSION;  Surgeon: Jake BatheSkains, Mark C, MD;  [facility];  Service: Cardiovascular;  Laterality: N/A;  . TEE WITHOUT CARDIOVERSION N/A 09/22/2019   Procedure: TRANSESOPHAGEAL ECHOCARDIOGRAM (TEE);  Surgeon: Little IshikawaSchumann, Christopher L, MD;  [facility];  Service: Endoscopy;  Laterality: N/A;    Family History  Problem Relation Age of Onset  . Colon cancer Father   . 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 Lives alone and independent PTA.  Daughter lives in [place]. He reports that he has never smoked. He has never used smokeless tobacco. He reports history of alcohol abuse--quit on and off used to drink 2 Liter bottle of wine/night--quit Jan 30th/21.  He reports that he does not use drugs.    Allergies: No Known Allergies    Medications Prior to Admission  Medication Sig Dispense Refill  . acetaminophen (TYLENOL) 500 MG tablet Take 1,000 mg by mouth every 6 (six) hours as needed for moderate pain or headache.     . albuterol (PROAIR HFA) 108 (90 BASE) MCG/ACT inhaler 2 puffs every 4 hours as needed only  if your can't catch your breath (Patient taking differently: Inhale 2 puffs into the lungs every 4 (four) hours as needed for wheezing. ) 1 Inhaler 1  . amiodarone (PACERONE) 200 MG tablet Take 1  tablet (200 mg total) by mouth daily. 90 tablet 2  . apixaban (ELIQUIS) 5 MG TABS tablet Take 1 tablet (5 mg total) by mouth 2 (two) times daily. 60 tablet 6  . atorvastatin (LIPITOR) 40 MG tablet Take 40 mg by mouth every evening.     Marland Kitchen BREO ELLIPTA 200-25 MCG/INH AEPB Inhale 1 puff into the lungs daily as needed (shortness of breath).     . furosemide  (LASIX) 40 MG tablet Take 2 tablets (80 mg total) by mouth daily. May take additional 40 mg if notice swelling of shortness of breath 30 tablet   . Glucosamine-Chondroit-Vit C-Mn (GLUCOSAMINE CHONDR 1500 COMPLX) CAPS Take 1 capsule by mouth 2 (two) times daily.    . insulin aspart protamine- aspart (NOVOLOG MIX 70/30) (70-30) 100 UNIT/ML injection Inject 20 Units into the skin 2 (two) times daily at 8 am and 10 pm. Dose varies - Prescription is for 20 units with each meal (twice daily). However administration depends on what types of meals being taken.    . metoprolol succinate (TOPROL-XL) 50 MG 24 hr tablet Take 1 tablet (50 mg total) by mouth daily. Take with or immediately following a meal. 90 tablet 1  . Multiple Vitamin (MULTIVITAMIN WITH MINERALS) TABS tablet Take 1 tablet by mouth daily.    Marland Kitchen omeprazole (PRILOSEC) 40 MG capsule Take 40 mg by mouth daily as needed (heart burn).     Letta Pate VERIO test strip 1 each by Other route in the morning, at noon, and at bedtime.     . potassium chloride (KLOR-CON) 10 MEQ tablet Take 10 mEq by mouth at bedtime.    Marland Kitchen PREVIDENT 5000 BOOSTER PLUS 1.1 % PSTE Place 1 application onto teeth in the morning and at bedtime.     . sertraline (ZOLOFT) 100 MG tablet Take 100 mg by mouth daily.      Home: Home Living Family/patient expects to be discharged to:: Private residence Living Arrangements: Alone Available Help at Discharge: Family, Other (Comment) Type of Home: House Home Access: Stairs to enter [employer] of Steps: 10 from parking spot to door Entrance Stairs-Rails: Left Home Layout: Two level, Bed/bath upstairs, 1/2 bath on main level Alternate Level Stairs-Number of Steps: 17 with landing Alternate Level Stairs-Rails: Right Bathroom Shower/Tub: Tub/shower unit, Health visitor: Handicapped height Bathroom Accessibility: Yes Home Equipment: Cane - single point  Functional History: Prior Function Level of  Independence: Needs assistance Gait / Transfers Assistance Needed: pt navigating stairs with slow step-to gait pattern with L foot leading with use of rails. reports no use of AD at home, use of driver and then [facility] for Dr visits ADL's / Homemaking Assistance Needed: pt reports home care service that does general home maintenance and cleaning, pt reports he does his own cooking adn ADL Functional Status:  Mobility: Bed Mobility Overal bed mobility: Needs Assistance Bed Mobility: Rolling, Sidelying to Sit Rolling: Min assist Sidelying to sit: Min assist Sit to supine: Max assist General bed mobility comments: pt able to initiate movement of BLE to EOB with minA to complete, then minA to raise trunk from elevated HOB with sig use of bed rail. Transfers Overall transfer level: Needs assistance Equipment used: Rolling walker (2 wheeled) Transfers: Sit to/from Stand, [employer] Transfers Sit to Stand: Mod assist, +2 physical assistance, Min assist Squat pivot transfers: Mod assist, +2 physical assistance, Max assist  Lateral/Scoot Transfers: Mod assist General transfer comment: pt able to complete sit-stand from elevated HOB with  min/modA of 2 to power up and minA of 2 to steady upon rise. The pt was then able to take small lateral steps to recliner with significant fatige and increased need for assist Ambulation/Gait Ambulation/Gait assistance: Mod assist, +2 physical assistance Gait Distance (Feet): 4 Feet Assistive device: Rolling walker (2 wheeled) Gait Pattern/deviations: Step-to pattern, Shuffle, Trunk flexed General Gait Details: pt with slow step-to movements of BLE with VC for each step/RW movement. Pt with difficulty moving LLE to R, pt fatigues quickly and has significant L lean with fatigue    ADL: ADL Overall ADL's : Needs assistance/impaired Eating/Feeding: Minimal assistance Grooming: Minimal assistance, Bed level Upper Body Bathing: Moderate assistance, Bed level Lower  Body Bathing: Maximal assistance, Bed level Upper Body Dressing : Maximal assistance, Sitting Lower Body Dressing: Total assistance, Bed level Toilet Transfer: (will need lift equipment) Toileting- Clothing Manipulation and Hygiene: Total assistance Toileting - Clothing Manipulation Details (indicate cue type and reason): declined to try male urinal; only wanting condom cath Functional mobility during ADLs: (will need +2)  Cognition: Cognition Overall Cognitive Status: Within Functional Limits for tasks assessed Arousal/Alertness: Awake/alert Orientation Level: Oriented X4 Attention: Focused, Sustained Focused Attention: Appears intact Sustained Attention: Appears intact Memory: Appears intact(Immediate: 5/5; delayed: 5/5) Awareness: Appears intact Problem Solving: Appears intact Executive Function: Reasoning, Sequencing Reasoning: (Abstract: 2/2) Sequencing: Appears intact Cognition Arousal/Alertness: Awake/alert Behavior During Therapy: WFL for tasks assessed/performed Overall Cognitive Status: Within Functional Limits for tasks assessed Area of Impairment: Attention, Safety/judgement, Awareness, Problem solving Current Attention Level: Selective Safety/Judgement: Decreased awareness of safety, Decreased awareness of deficits Awareness: Emergent Problem Solving: Slow processing General Comments: Pt with improved engagement and motivation today   Blood pressure 117/64, pulse 65, temperature 98.4 F (36.9 C), temperature source Oral, resp. rate 19, height  (1.88 m), weight 123.6 kg, SpO2 95 %.  Physical Exam  Nursing note and vitals reviewed. Constitutional: He appears well-developed and well-nourished. Obese male. NAD  General: Alert and oriented x 3, No apparent distress HEENT: Head is normocephalic, atraumatic, PERRLA, EOMI, sclera anicteric, oral mucosa pink and moist, dentition intact, ext ear canals clear,  Neck: Supple without JVD or lymphadenopathy Heart: Reg  rate and rhythm. No murmurs rubs or gallops Chest: CTA bilaterally without wheezes, rales, or rhonchi; no distress Abdomen: Soft, non-tender, non-distended, bowel sounds positive. Extremities: No clubbing, cyanosis, or edema. Pulses are 2+ Skin: Clean and intact without signs of breakdown Neuro/MSK: Decreased hearing. Minimal dysarthria. He was able to follow simple commands without difficulty. Left sided weakness with decrease in fine motor movements.   Psych: Pt's affect is appropriate. Pt is cooperative  Results for orders placed or performed during the hospital encounter of 12/03/19 (from the past 24 hour(s))  Glucose, capillary     Status: Abnormal   Collection Time: 12/05/19 12:32 PM  Result Value Ref Range   Glucose-Capillary 112 (H) 70 - 99 mg/dL  Glucose, capillary     Status: Abnormal   Collection Time: 12/05/19  5:12 PM  Result Value Ref Range   Glucose-Capillary 113 (H) 70 - 99 mg/dL  Glucose, capillary     Status: None   Collection Time: 12/05/19  9:18 PM  Result Value Ref Range   Glucose-Capillary 98 70 - 99 mg/dL   Comment 1 Notify RN    Comment 2 Document in Chart   CBC     Status: Abnormal   Collection Time: 12/06/19  4:11 AM  Result Value Ref Range   WBC 10.1 4.0 - 10.5  K/uL   RBC 3.53 (L) 4.22 - 5.81 MIL/uL   Hemoglobin 9.6 (L) 13.0 - 17.0 g/dL   HCT 31.2 (L) 39.0 - 52.0 %   MCV 88.4 80.0 - 100.0 fL   MCH 27.2 26.0 - 34.0 pg   MCHC 30.8 30.0 - 36.0 g/dL   RDW 14.6 11.5 - 15.5 %   Platelets 354 150 - 400 K/uL   nRBC 0.0 0.0 - 0.2 %  Magnesium     Status: None   Collection Time: 12/06/19  4:11 AM  Result Value Ref Range   Magnesium 2.1 1.7 - 2.4 mg/dL  Phosphorus     Status: None   Collection Time: 12/06/19  4:11 AM  Result Value Ref Range   Phosphorus 4.2 2.5 - 4.6 mg/dL  Basic metabolic panel     Status: Abnormal   Collection Time: 12/06/19  4:11 AM  Result Value Ref Range   Sodium 144 135 - 145 mmol/L   Potassium 3.7 3.5 - 5.1 mmol/L   Chloride 104  98 - 111 mmol/L   CO2 29 22 - 32 mmol/L   Glucose, Bld 75 70 - 99 mg/dL   BUN 16 8 - 23 mg/dL   Creatinine, Ser 1.29 (H) 0.61 - 1.24 mg/dL   Calcium 9.0 8.9 - 10.3 mg/dL   GFR calc non Af Amer 55 (L) >60 mL/min   GFR calc Af Amer >60 >60 mL/min   Anion gap 11 5 - 15  Glucose, capillary     Status: None   Collection Time: 12/06/19  6:11 AM  Result Value Ref Range   Glucose-Capillary 72 70 - 99 mg/dL   Comment 1 Notify RN    Comment 2 Document in Chart    MR ANGIO HEAD WO CONTRAST  Result Date: 12/04/2019 CLINICAL DATA:  TIA. Left-sided weakness and facial droop. EXAM: MRI HEAD WITHOUT CONTRAST MRA HEAD WITHOUT CONTRAST TECHNIQUE: Multiplanar, multiecho pulse sequences of the brain and surrounding structures were obtained without intravenous contrast. Angiographic images of the head were obtained using MRA technique without contrast. COMPARISON:  Head CT and CTA 12/03/2019 FINDINGS: MRI HEAD FINDINGS Brain: There is a small acute infarct involving the right corona radiata and posterior right lentiform nucleus. T2 hyperintensities in the cerebral white matter bilaterally are nonspecific but compatible with mild chronic small vessel ischemic disease. Mild cerebral atrophy is within normal limits for age. No intracranial hemorrhage, mass, midline shift, or extra-axial fluid collection is identified. Vascular: Major intracranial vascular flow voids are preserved. Skull and upper cervical spine: Decreased bone marrow T1 signal intensity in the skull and included upper cervical spine, nonspecific but may be related to the patient's known anemia. Sinuses/Orbits: Unremarkable orbits. Mild mucosal thickening in the paranasal sinuses. Trace left mastoid fluid. Other: None. MRA HEAD FINDINGS The visualized distal vertebral arteries are patent to the basilar with a fenestration partially visualized in the right vertebral artery, a normal variant. The left PICA and right AICA appear dominant. Patent SCAs are seen  bilaterally. The basilar artery is widely patent. There is a moderately large right posterior communicating artery. Both PCAs are patent without evidence of significant proximal stenosis. The internal carotid arteries are widely patent from skull base to carotid termini. ACAs and MCAs are patent without evidence of proximal branch occlusion or significant proximal stenosis. No aneurysm is identified. IMPRESSION: 1. 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 2. Mild chronic small vessel ischemic disease. 3. Negative head MRA. Electronically Signed   By: Zenia Resides  Mosetta Putt M.D.   On: 12/04/2019 09:37   MR BRAIN WO CONTRAST  Result Date: 12/04/2019 CLINICAL DATA:  TIA. Left-sided weakness and facial droop. EXAM: MRI HEAD WITHOUT CONTRAST MRA HEAD WITHOUT CONTRAST TECHNIQUE: Multiplanar, multiecho pulse sequences of the brain and surrounding structures were obtained without intravenous contrast. Angiographic images of the head were obtained using MRA technique without contrast. COMPARISON:  Head CT and CTA 12/03/2019 FINDINGS: MRI HEAD FINDINGS Brain: There is a small acute infarct involving the right corona radiata and posterior right lentiform nucleus. T2 hyperintensities in the cerebral white matter bilaterally are nonspecific but compatible with mild chronic small vessel ischemic disease. Mild cerebral atrophy is within normal limits for age. No intracranial hemorrhage, mass, midline shift, or extra-axial fluid collection is identified. Vascular: Major intracranial vascular flow voids are preserved. Skull and upper cervical spine: Decreased bone marrow T1 signal intensity in the skull and included upper cervical spine, nonspecific but may be related to the patient's known anemia. Sinuses/Orbits: Unremarkable orbits. Mild mucosal thickening in the paranasal sinuses. Trace left mastoid fluid. Other: None. MRA HEAD FINDINGS The visualized distal vertebral arteries are patent to the basilar with a  fenestration partially visualized in the right vertebral artery, a normal variant. The left PICA and right AICA appear dominant. Patent SCAs are seen bilaterally. The basilar artery is widely patent. There is a moderately large right posterior communicating artery. Both PCAs are patent without evidence of significant proximal stenosis. The internal carotid arteries are widely patent from skull base to carotid termini. ACAs and MCAs are patent without evidence of proximal branch occlusion or significant proximal stenosis. No aneurysm is identified. IMPRESSION: 1. 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 2. Mild chronic small vessel ischemic disease. 3. Negative head MRA. Electronically Signed   By: Sebastian Ache M.D.   On: 12/04/2019 09:37   DG CHEST PORT 1 VIEW  Result Date: 12/04/2019 CLINICAL DATA:  Left-sided weakness and facial droop EXAM: PORTABLE CHEST 1 VIEW COMPARISON:  09/19/2019 FINDINGS: Single frontal view of the chest was performed. Cardiac silhouette is stable. There is veiling opacity at the left lung base, new since prior study. No pneumothorax. No acute bony abnormalities. IMPRESSION: 1. Left basilar veiling opacity consistent with consolidation and/or effusion. Electronically Signed   By: Sharlet Salina M.D.   On: 12/04/2019 19:13   ECHOCARDIOGRAM COMPLETE  Result Date: 12/04/2019    ECHOCARDIOGRAM REPORT   Patient Name:   Brandon Sanchez Date of Exam: 12/04/2019 Medical Rec #:  [id number]        Height:       74.0 in Accession #:    [id number]       Weight:       272.5 lb Date of Birth:  [birth year]        BSA:          2.479 m Patient Age:    73 years         BP:           104/79 mmHg Patient Gender: M                HR:           70 bpm. Exam Location:  Inpatient Procedure: 2D Echo, Cardiac Doppler and Color Doppler Indications:    TIA 435.9 / G45.9  History:        Patient has prior history of Echocardiogram examinations, most  recent 09/20/2019. Risk  Factors:Hypertension, Diabetes and                 Dyslipidemia. Cerebral embolism with cerebral                 infarction,Paroxysmal atrial fibrillation,Obesity.  Sonographer:    Celesta Gentile RCS Referring Phys: [id number] Deno Lunger [facility] IMPRESSIONS  1. Left ventricular ejection fraction, by estimation, is 55 to 60%. The left ventricle has normal function. The left ventricle has no regional wall motion abnormalities. The left ventricular internal cavity size was mildly dilated. There is mild concentric left ventricular hypertrophy. Left ventricular diastolic parameters are consistent with Grade III diastolic dysfunction (restrictive). Elevated left ventricular end-diastolic pressure.  2. Right ventricular systolic function is normal. The right ventricular size is mildly enlarged.  3. Left atrial size was mildly dilated.  4. Right atrial size was mild to moderately dilated.  5. The mitral valve is grossly normal. Trivial mitral valve regurgitation. No evidence of mitral stenosis.  6. The aortic valve is tricuspid. Aortic valve regurgitation is mild to moderate. Moderate aortic valve stenosis.  7. The inferior vena cava is dilated in size with >50% respiratory variability, suggesting right atrial pressure of 8 mmHg. Conclusion(s)/Recommendation(s): Elevated LVEDP, restrictive diastolic filling, moderate aortic stenosis with mild-moderate aortic regurgitation. FINDINGS  Left Ventricle: Left ventricular ejection fraction, by estimation, is 55 to 60%. The left ventricle has normal function. The left ventricle has no regional wall motion abnormalities. The left ventricular internal cavity size was mildly dilated. There is  mild concentric left ventricular hypertrophy. Left ventricular diastolic parameters are consistent with Grade III diastolic dysfunction (restrictive). Elevated left ventricular end-diastolic pressure. Right Ventricle: The right ventricular size is mildly enlarged. No increase in right ventricular  wall thickness. Right ventricular systolic function is normal. Left Atrium: Left atrial size was mildly dilated. Right Atrium: Right atrial size was mild to moderately dilated. Pericardium: Trivial pericardial effusion is present. Presence of pericardial fat pad. Mitral Valve: The mitral valve is grossly normal. Trivial mitral valve regurgitation. No evidence of mitral valve stenosis. Tricuspid Valve: The tricuspid valve is grossly normal. Tricuspid valve regurgitation is trivial. No evidence of tricuspid stenosis. Aortic Valve: The aortic valve is tricuspid. . There is moderate thickening and moderate calcification of the aortic valve. Aortic valve regurgitation is mild to moderate. Aortic regurgitation PHT measures 476 msec. Moderate aortic stenosis is present. There is moderate thickening of the aortic valve. There is moderate calcification of the aortic valve. Aortic valve mean gradient measures 30.5 mmHg. Aortic valve peak gradient measures 48.0 mmHg. Aortic valve area, by VTI measures 1.09 cm. Pulmonic Valve: The pulmonic valve was not well visualized. Pulmonic valve regurgitation is not visualized. No evidence of pulmonic stenosis. Aorta: The aortic root and ascending aorta are structurally normal, with no evidence of dilitation. Venous: The inferior vena cava is dilated in size with greater than 50% respiratory variability, suggesting right atrial pressure of 8 mmHg. IAS/Shunts: The atrial septum is grossly normal. Additional Comments: There is a small pleural effusion.  LEFT VENTRICLE PLAX 2D LVIDd:         5.50 cm  Diastology LVIDs:         3.61 cm  LV e' lateral:   7.18 cm/s LV PW:         1.20 cm  LV E/e' lateral: 21.6 LV IVS:        1.20 cm  LV e' medial:    5.77 cm/s LVOT diam:  1.90 cm  LV E/e' medial:  26.9 LV SV:         100 LV SV Index:   40 LVOT Area:     2.84 cm  RIGHT VENTRICLE RV S prime:     13.50 cm/s TAPSE (M-mode): 1.9 cm LEFT ATRIUM             Index       RIGHT ATRIUM           Index  LA diam:        4.50 cm 1.82 cm/m  RA Area:     31.00 cm LA Vol (A2C):   95.4 ml 38.49 ml/m RA Volume:   115.00 ml 46.39 ml/m LA Vol (A4C):   72.0 ml 29.05 ml/m LA Biplane Vol: 83.0 ml 33.48 ml/m  AORTIC VALVE AV Area (Vmax):    1.13 cm AV Area (Vmean):   1.01 cm AV Area (VTI):     1.09 cm AV Vmax:           346.50 cm/s AV Vmean:          261.000 cm/s AV VTI:            0.915 m AV Peak Grad:      48.0 mmHg AV Mean Grad:      30.5 mmHg LVOT Vmax:         138.00 cm/s LVOT Vmean:        92.800 cm/s LVOT VTI:          0.352 m LVOT/AV VTI ratio: 0.38 AI PHT:            476 msec  AORTA Ao Root diam: 3.30 cm MITRAL VALVE MV Area (PHT): 2.97 cm     SHUNTS MV Decel Time: 256 msec     Systemic VTI:  0.35 m MV E velocity: 155.00 cm/s  Systemic Diam: 1.90 cm MV A velocity: 94.60 cm/s MV E/A ratio:  1.64 Bridgette Christopher MD Electronically signed by Jodelle Red MD Signature Date/Time: 12/04/2019/4:02:44 PM    Final      Assessment/Plan: Diagnosis: Left sided weakness and facial droop secondary to 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 1. Does the need for close, 24 hr/day medical supervision in concert with the patient's rehab needs make it unreasonable for this patient to be served in a less intensive setting? Yes 2. Co-Morbidities requiring supervision/potential complications: diabetes mellitus type 2, paroxysmal atrial fibrillation, hyperlipidemia, hypertension  3. Due to bladder management, bowel management, safety, skin/wound care, disease management, medication administration, pain management and patient education, does the patient require 24 hr/day rehab nursing? Yes 4. Does the patient require coordinated care of a physician, rehab nurse, therapy disciplines of PT, OT to address physical and functional deficits in the context of the above medical diagnosis(es)? Yes Addressing deficits in the following areas: balance, endurance,  locomotion, strength, transferring, bowel/bladder control, bathing, dressing, feeding, grooming, toileting and psychosocial support 5. Can the patient actively participate in an intensive therapy program of at least 3 hrs of therapy per day at least 5 days per week? Yes 6. The potential for patient to make measurable gains while on inpatient rehab is excellent 7. Anticipated functional outcomes upon discharge from inpatient rehab are modified independent  with PT, modified independent with OT, independent with SLP. 8. Estimated rehab length of stay to reach the above functional goals is: 10-14 days 9. Anticipated discharge destination: Home 10. Overall Rehab/Functional Prognosis: excellent  RECOMMENDATIONS: This patient's condition is appropriate for continued rehabilitative care in the following setting: CIR Patient has agreed to participate in recommended program. Yes Note that insurance prior authorization may be required for reimbursement for recommended care.  Comment: Mr. Dougal would be an excellent CIR candidate. He will have the assistance of his daughter 24/7 upon discharge home.   Jacquelynn Cree, PA-C 12/06/2019   I have personally performed a face to face diagnostic evaluation, including, but not limited to relevant history and physical exam findings, of this patient and developed relevant assessment and plan.  Additionally, I have reviewed and concur with the physician assistant's documentation above.  Sula Soda, MD

## 2019-12-06 NOTE — Progress Notes (Signed)
PROGRESS NOTE    Brandon Sanchez  BZJ:[id number] DOB: [birth year] DOA: 12/03/2019 PCP: Merri Brunette, MD   Brief Narrative: 74 year old male with past medical history of diabetes mellitus type 2, paroxysmal atrial fibrillation, hyperlipidemia, hypertension who presents to [facility] emergency department with left-sided weakness and left facial droop.  Patient was brought to the ER, code stroke on arrival, seen by neurology CT head and CT angiogram head and neck done and due to timeline of symptoms and patient's already being on Eliquis TPA was not administered and patient was admitted for further management. 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  Is able to move his leg and improving, seen by neurology.  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  Patient completed stroke work-up with MRI-right BG/CR is small infarcts, MRA head normal, no emergent large vessel occlusion on CTA head and neck- see findings below.  With EF 55 to 60% 2, hemoglobin A1c at 7.6.  Uncontrolled type 2 diabetes mellitus with hypoglycemia without coma, with long-term current use of insulin: Hemoglobin A1c 7.6, blood sugar controlled on home regimen of NovoLog.  Was  hypoglycemic on arrival needing dextrose. Recent Labs  Lab 12/05/19 1232 12/05/19 1712 12/05/19 2118 12/06/19 0611 12/06/19 1116  GLUCAP 112* 113* 98 72 116*   Essential hypertension: BP well controlled on Lasix.  Metoprolol on hold-and discontinued due to hypotension and fluctuating neuro symptoms, Lasix dose has been cut down to 40.  Avoid low blood pressure.   Paroxysmal atrial fibrillation: Continue Eliquis, amiodarone.  Rate is controlled and in sinus rhythm.  Hyperlipidemia on Lipitor.  Morbid obesity with BMI 34: He will benefit with weight loss and healthy lifestyle.  Mildly elevated creatinine 1.29.  Monitor intermittently.  Anemia likely from chronic disease.  Hemoglobin is stable.  DVT prophylaxis: eliquis Code Status:full Family Communication: plan of care discussed with patient at bedside. Status is: Inpatient  Remains inpatient appropriate because:Inpatient level of care appropriate due to severity of illness  Dispo: The patient is from: Home              Anticipated d/c is to: CIR              Anticipated d/c date is: 1 day              Patient currently is medically stable to d/c. Nutrition: Diet Order            Diet heart healthy/carb modified Room service appropriate? Yes; Fluid consistency: Thin  Diet effective now             Body mass index is 34.99 kg/m.  Consultants:see note  Procedures:  CT Code Stroke CTA Head W/WO contrast CT Code Stroke CTA Neck W/WO contrast 12/03/2019 IMPRESSION:  1. No emergent large vessel occlusion  2. Atherosclerotic changes in the distal common carotid arteries and at the carotid bifurcations bilaterally significant stenosis.  3. Aortic Atherosclerosis (ICD10-I70.0).  4. Degenerative changes of the cervical spine including fusion at C3-4 and C6-7.  CT HEAD CODE STROKE WO CONTRAST 12/03/2019 IMPRESSION:  1. No acute intracranial abnormality.  2. Mild generalized  atrophy and white matter disease. This likely reflects the sequela of chronic microvascular ischemia.  3. ASPECTS is 10/10.  4. Mild sinus disease.   MRI / MRA Head 1. 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 2. Mild chronic small vessel ischemic disease. 3. Negative head MRA.  DG Chest Port 1 View 12/04/2019 IMPRESSION: Left basilar veiling opacity consistent with consolidation  and/or Effusion.   Transthoracic Echocardiogram  1. Left ventricular ejection fraction, by estimation, is 55 to 60%. The  left ventricle has normal function. The left ventricle has no regional  wall motion abnormalities. The left ventricular internal cavity size was  mildly dilated. There is mild  concentric left ventricular hypertrophy. Left ventricular diastolic  parameters are consistent with Grade III diastolic dysfunction  (restrictive). Elevated left ventricular end-diastolic pressure.  2. Right ventricular systolic function is normal. The right ventricular  size is mildly enlarged.  3. Left atrial size was mildly dilated.  4. Right atrial size was mild to moderately dilated.  5. The mitral valve is grossly normal. Trivial mitral valve  regurgitation. No evidence of mitral stenosis.  6. The aortic valve is tricuspid. Aortic valve regurgitation is mild to  moderate. Moderate aortic valve stenosis.  7. The inferior vena cava is dilated in size with >50% respiratory  variability, suggesting right atrial pressure of 8 mmHg.    Microbiology:see note  Medications: Scheduled Meds: . amiodarone  200 mg Oral Daily  . apixaban  5 mg Oral BID  . aspirin EC  81 mg Oral Daily  . atorvastatin  80 mg Oral Daily  . furosemide  40 mg Oral Daily  . insulin aspart  0-15 Units Subcutaneous TID AC & HS  . insulin aspart protamine- aspart  20 Units Subcutaneous BID WC  . pantoprazole  40 mg Oral Daily  . sertraline  100 mg Oral Daily   Continuous Infusions: . sodium chloride 75 mL/hr at 12/05/19 0600    Antimicrobials: Anti-infectives (From admission, onward)   None       Objective: Vitals: Today's Vitals   12/05/19 2331 12/06/19 0417 12/06/19 0813 12/06/19 0929  BP: (!) 116/54 (!) 125/59 117/64   Pulse: 60 62 65   Resp: 17 17 19    Temp: 98.4 F (36.9 C) 97.8 F (36.6 C) 98.4 F (36.9 C)   TempSrc: Oral Oral Oral   SpO2: 98% 95% 95%   Weight:      Height:       PainSc:    2     Intake/Output Summary (Last 24 hours) at 12/06/2019 0936 Last data filed at 12/06/2019 0818 Gross per 24 hour  Intake 715.16 ml  Output 350 ml  Net 365.16 ml   Filed Weights   12/03/19 2142 12/04/19 0400  Weight: 125.2 kg 123.6 kg   Weight change:    Intake/Output from previous day: 04/18 0701 - 04/19 0700 In: 595.2 [I.V.:595.2] Out: 350 [Urine:350] Intake/Output this shift: Total I/O In: 120 [P.O.:120] Out: -   Examination:  General exam: AAOx3 ,NAD,weak appearing. HEENT:Oral mucosa moist, Ear/Nose WNL grossly,dentition normal. Respiratory system: bilaterally clear,no wheezing or crackles,no use of accessory muscle, non tender. Cardiovascular system: S1 & S2 +, regular, No JVD. Gastrointestinal system: Abdomen soft, NT,ND, BS+. Nervous System:Alert, awake, moving extremities and mild weakness on left leg- but has not gotten up yet. Extremities: No edema, distal peripheral pulses palpable.  Skin: No rashes,no icterus. MSK: Normal muscle bulk,tone, power  Data Reviewed: I have personally reviewed following labs and imaging  studies CBC: Recent Labs  Lab 12/03/19 2109 12/03/19 2112 12/05/19 0400 12/06/19 0411  WBC  --  13.8* 10.0 10.1  NEUTROABS  --  8.2*  --   --   HGB 12.2* 10.9* 10.0* 9.6*  HCT 36.0* 36.3* 33.1* 31.2*  MCV  --  89.4 88.7 88.4  PLT  --  393 350 354   Basic Metabolic Panel: Recent Labs  Lab 12/03/19 2109 12/03/19 2112 12/05/19 0400 12/06/19 0411  NA 142 137 143 144  K 3.9 3.9 3.3* 3.7  CL 100 97* 105 104  CO2  --  25 30 29   GLUCOSE 74 74 78 75  BUN 30* 27* 16 16  CREATININE 1.30* 1.44* 1.12 1.29*  CALCIUM  --  9.1 9.0 9.0  MG  --   --  2.1 2.1  PHOS  --   --  3.9 4.2   GFR: Estimated Creatinine Clearance: 72.3 mL/min (A) (by C-G formula based on SCr of 1.29 mg/dL (H)). Liver Function Tests: Recent Labs  Lab 12/03/19 2112  AST 41  ALT 39  ALKPHOS 78  BILITOT 0.5  PROT 7.2  ALBUMIN 2.8*   No results for  input(s): LIPASE, AMYLASE in the last 168 hours. No results for input(s): AMMONIA in the last 168 hours. Coagulation Profile: Recent Labs  Lab 12/03/19 2150  INR 1.2   Cardiac Enzymes: No results for input(s): CKTOTAL, CKMB, CKMBINDEX, TROPONINI in the last 168 hours. BNP (last 3 results) No results for input(s): PROBNP in the last 8760 hours. HbA1C: Recent Labs    12/04/19 0503  HGBA1C 7.6*   CBG: Recent Labs  Lab 12/05/19 0758 12/05/19 1232 12/05/19 1712 12/05/19 2118 12/06/19 0611  GLUCAP 92 112* 113* 98 72   Lipid Profile: Recent Labs    12/04/19 0503  CHOL 96  HDL 32*  LDLCALC 52  TRIG 59  CHOLHDL 3.0   Thyroid Function Tests: No results for input(s): TSH, T4TOTAL, FREET4, T3FREE, THYROIDAB in the last 72 hours. Anemia Panel: No results for input(s): VITAMINB12, FOLATE, FERRITIN, TIBC, IRON, RETICCTPCT in the last 72 hours. Sepsis Labs: No results for input(s): PROCALCITON, LATICACIDVEN in the last 168 hours.  Recent Results (from the past 240 hour(s))  SARS CORONAVIRUS 2 (TAT 6-24 HRS) Nasopharyngeal Nasopharyngeal Swab     Status: None   Collection Time: 12/04/19  1:00 AM   Specimen: Nasopharyngeal Swab  Result Value Ref Range Status   SARS Coronavirus 2 NEGATIVE NEGATIVE Final    Comment: (NOTE) SARS-CoV-2 target nucleic acids are NOT DETECTED. The SARS-CoV-2 RNA is generally detectable in upper and lower respiratory specimens during the acute phase of infection. Negative results do not preclude SARS-CoV-2 infection, do not rule out co-infections with other pathogens, and should not be used as the sole basis for treatment or other patient management decisions. Negative results must be combined with clinical observations, patient history, and epidemiological information. The expected result is Negative. Fact Sheet for Patients: 12/06/19 Fact Sheet for Healthcare Providers: HairSlick.no  This test is not yet approved or cleared by the quierodirigir.com FDA and  has been authorized for detection and/or diagnosis of SARS-CoV-2 by FDA under an Emergency Use Authorization (EUA). This EUA will remain  in effect (meaning this test can be used) for the duration of the COVID-19 declaration under Section 56 4(b)(1) of the Act, 21 U.S.C. section 360bbb-3(b)(1), unless the authorization is terminated or revoked sooner. Performed at [facility], [address] [place], [place]  Radiology Studies: DG CHEST PORT 1 VIEW  Result Date: 12/04/2019 CLINICAL DATA:  Left-sided weakness and facial droop EXAM: PORTABLE CHEST 1 VIEW COMPARISON:  09/19/2019 FINDINGS: Single frontal view of the chest was performed. Cardiac silhouette is stable. There is veiling opacity at the left lung base, new since prior study. No pneumothorax. No acute bony abnormalities. IMPRESSION: 1. Left basilar veiling opacity consistent with consolidation and/or effusion. Electronically Signed   By: Sharlet Salina M.D.   On: 12/04/2019 19:13   ECHOCARDIOGRAM COMPLETE  Result Date: 12/04/2019    ECHOCARDIOGRAM REPORT   Patient Name:   Brandon Sanchez Date of Exam: 12/04/2019 Medical Rec #:  [id number]        Height:       74.0 in Accession #:    [id number]       Weight:       272.5 lb Date of Birth:  31-Jul-1946        BSA:          2.479 m Patient Age:    73 years         BP:           104/79 mmHg Patient Gender: M                HR:           70 bpm. Exam Location:  Inpatient Procedure: 2D Echo, Cardiac Doppler and Color Doppler Indications:    TIA 435.9 / G45.9  History:        Patient has prior history of Echocardiogram examinations, most                 recent 09/20/2019. Risk Factors:Hypertension, Diabetes and                 Dyslipidemia. Cerebral embolism with cerebral                 infarction,Paroxysmal atrial fibrillation,Obesity.  Sonographer:    Celesta Gentile RCS Referring Phys: [id number] Deno Lunger  [facility] IMPRESSIONS  1. Left ventricular ejection fraction, by estimation, is 55 to 60%. The left ventricle has normal function. The left ventricle has no regional wall motion abnormalities. The left ventricular internal cavity size was mildly dilated. There is mild concentric left ventricular hypertrophy. Left ventricular diastolic parameters are consistent with Grade III diastolic dysfunction (restrictive). Elevated left ventricular end-diastolic pressure.  2. Right ventricular systolic function is normal. The right ventricular size is mildly enlarged.  3. Left atrial size was mildly dilated.  4. Right atrial size was mild to moderately dilated.  5. The mitral valve is grossly normal. Trivial mitral valve regurgitation. No evidence of mitral stenosis.  6. The aortic valve is tricuspid. Aortic valve regurgitation is mild to moderate. Moderate aortic valve stenosis.  7. The inferior vena cava is dilated in size with >50% respiratory variability, suggesting right atrial pressure of 8 mmHg. Conclusion(s)/Recommendation(s): Elevated LVEDP, restrictive diastolic filling, moderate aortic stenosis with mild-moderate aortic regurgitation. FINDINGS  Left Ventricle: Left ventricular ejection fraction, by estimation, is 55 to 60%. The left ventricle has normal function. The left ventricle has no regional wall motion abnormalities. The left ventricular internal cavity size was mildly dilated. There is  mild concentric left ventricular hypertrophy. Left ventricular diastolic parameters are consistent with Grade III diastolic dysfunction (restrictive). Elevated left ventricular end-diastolic pressure. Right Ventricle: The right ventricular size is mildly enlarged. No increase in right ventricular wall thickness. Right ventricular systolic function is normal. Left Atrium: Left  atrial size was mildly dilated. Right Atrium: Right atrial size was mild to moderately dilated. Pericardium: Trivial pericardial effusion is present.  Presence of pericardial fat pad. Mitral Valve: The mitral valve is grossly normal. Trivial mitral valve regurgitation. No evidence of mitral valve stenosis. Tricuspid Valve: The tricuspid valve is grossly normal. Tricuspid valve regurgitation is trivial. No evidence of tricuspid stenosis. Aortic Valve: The aortic valve is tricuspid. . There is moderate thickening and moderate calcification of the aortic valve. Aortic valve regurgitation is mild to moderate. Aortic regurgitation PHT measures 476 msec. Moderate aortic stenosis is present. There is moderate thickening of the aortic valve. There is moderate calcification of the aortic valve. Aortic valve mean gradient measures 30.5 mmHg. Aortic valve peak gradient measures 48.0 mmHg. Aortic valve area, by VTI measures 1.09 cm. Pulmonic Valve: The pulmonic valve was not well visualized. Pulmonic valve regurgitation is not visualized. No evidence of pulmonic stenosis. Aorta: The aortic root and ascending aorta are structurally normal, with no evidence of dilitation. Venous: The inferior vena cava is dilated in size with greater than 50% respiratory variability, suggesting right atrial pressure of 8 mmHg. IAS/Shunts: The atrial septum is grossly normal. Additional Comments: There is a small pleural effusion.  LEFT VENTRICLE PLAX 2D LVIDd:         5.50 cm  Diastology LVIDs:         3.61 cm  LV e' lateral:   7.18 cm/s LV PW:         1.20 cm  LV E/e' lateral: 21.6 LV IVS:        1.20 cm  LV e' medial:    5.77 cm/s LVOT diam:     1.90 cm  LV E/e' medial:  26.9 LV SV:         100 LV SV Index:   40 LVOT Area:     2.84 cm  RIGHT VENTRICLE RV S prime:     13.50 cm/s TAPSE (M-mode): 1.9 cm LEFT ATRIUM             Index       RIGHT ATRIUM           Index LA diam:        4.50 cm 1.82 cm/m  RA Area:     31.00 cm LA Vol (A2C):   95.4 ml 38.49 ml/m RA Volume:   115.00 ml 46.39 ml/m LA Vol (A4C):   72.0 ml 29.05 ml/m LA Biplane Vol: 83.0 ml 33.48 ml/m  AORTIC VALVE AV Area  (Vmax):    1.13 cm AV Area (Vmean):   1.01 cm AV Area (VTI):     1.09 cm AV Vmax:           346.50 cm/s AV Vmean:          261.000 cm/s AV VTI:            0.915 m AV Peak Grad:      48.0 mmHg AV Mean Grad:      30.5 mmHg LVOT Vmax:         138.00 cm/s LVOT Vmean:        92.800 cm/s LVOT VTI:          0.352 m LVOT/AV VTI ratio: 0.38 AI PHT:            476 msec  AORTA Ao Root diam: 3.30 cm MITRAL VALVE MV Area (PHT): 2.97 cm     SHUNTS MV Decel Time: 256 msec  Systemic VTI:  0.35 m MV E velocity: 155.00 cm/s  Systemic Diam: 1.90 cm MV A velocity: 94.60 cm/s MV E/A ratio:  1.64 Buford Dresser MD Electronically signed by Buford Dresser MD Signature Date/Time: 12/04/2019/4:02:44 PM    Final      LOS: 2 days   Time spent: More than 50% of that time was spent in counseling and/or coordination of care.  Antonieta Pert, MD [facility]  12/06/2019, 9:36 AM

## 2019-12-06 NOTE — Procedures (Signed)
Patient Name: Brandon Sanchez, Trainer Date: 11/24/2019 Gender: Male D.O.B: July 24, 1946 Age (years): 73 Referring Provider: Alphonzo Severance PA Height (inches): 74 Interpreting Physician: Nicki Guadalajara MD, ABSM Weight (lbs): 270 RPSGT: Ulyess Mort BMI: 35 MRN: [id number] Neck Size: 16.50  CLINICAL INFORMATION Sleep Study Type: NPSG  Indication for sleep study: Diabetes, Hypertension, Obesity  Epworth Sleepiness Score: 7  SLEEP STUDY TECHNIQUE As per the AASM Manual for the Scoring of Sleep and Associated Events v2.3 (April 2016) with a hypopnea requiring 4% desaturations.  The channels recorded and monitored were frontal, central and occipital EEG, electrooculogram (EOG), submentalis EMG (chin), nasal and oral airflow, thoracic and abdominal wall motion, anterior tibialis EMG, snore microphone, electrocardiogram, and pulse oximetry.  MEDICATIONS acetaminophen (TYLENOL) 500 MG tablet  albuterol (PROAIR HFA) 108 (90 BASE) MCG/ACT inhaler  amiodarone (PACERONE) 200 MG tablet  apixaban (ELIQUIS) 5 MG TABS tablet  atorvastatin (LIPITOR) 40 MG tablet  BREO ELLIPTA 200-25 MCG/INH AEPB  furosemide (LASIX) 40 MG tablet  Glucosamine-Chondroit-Vit C-Mn (GLUCOSAMINE CHONDR 1500 COMPLX) CAPS  insulin aspart protamine- aspart (NOVOLOG MIX 70/30) (70-30) 100 UNIT/ML injection  metoprolol succinate (TOPROL-XL) 50 MG 24 hr tablet  Multiple Vitamin (MULTIVITAMIN WITH MINERALS) TABS tablet  omeprazole (PRILOSEC) 40 MG capsule  ONETOUCH VERIO test strip  potassium chloride (KLOR-CON) 10 MEQ tablet  PREVIDENT 5000 BOOSTER PLUS 1.1 % PSTE  sertraline (ZOLOFT) 100 MG tablet   Medications self-administered by patient taken the night of the study : N/A  SLEEP ARCHITECTURE The study was initiated at 10:33:31 PM and ended at 4:59:15 AM.  Sleep onset time was 39.6 minutes and the sleep efficiency was 49.0%%. The total sleep time was 189 minutes.  Stage REM latency was 294.5  minutes.  The patient spent 27.8%% of the night in stage N1 sleep, 65.3%% in stage N2 sleep, 0.0%% in stage N3 and 6.9% in REM.  Alpha intrusion was absent.  Supine sleep was 2.91%.  RESPIRATORY PARAMETERS The overall apnea/hypopnea index (AHI) was 1.9 per hour. The respiratory disturbance index (RDI) was 12.1/h. There were 4 total apneas, including 0 obstructive, 2 central and 2 mixed apneas. There were 2 hypopneas and 32 RERAs.  The AHI during Stage REM sleep was 0.0 per hour.  AHI while supine was 54.5 per hour.  The mean oxygen saturation was 91.7%. The minimum SpO2 during sleep was 89.0%.  Moderate snoring was noted during this study.  CARDIAC DATA The 2 lead EKG demonstrated sinus rhythm. The mean heart rate was 59.4 beats per minute. Other EKG findings include: PVCs.  LEG MOVEMENT DATA The total PLMS were 0 with a resulting PLMS index of 0.0. Associated arousal with leg movement index was 7.3 .  IMPRESSIONS - No significant obstructive sleep apnea overall (AHI 1.9/h); however, the patient only slept in the supine position for 2.91% of the study and there is severe sleep apnea with supine sleep (AHI 54.5/h). - No significant central sleep apnea occurred during this study (CAI = 0.6/h). - The patient had minimal oxygen desaturation to a nadir of 89.0%. - The patient snored with moderate snoring volume. - EKG findings include PVCs. - Clinically significant periodic limb movements did not occur during sleep. Associated arousals were significant.  DIAGNOSIS - Sleep apnea, unspecified G47.30 - Periodic Limb Movement During Sleep (327.51 [G47.61 ICD-10]) - Nocturnal Hypoxemia (327.26 [G47.36 ICD-10])  RECOMMENDATIONS - At present patient does not meet critieria for CPAP as long as supine sleep is avoided.  - Effort should be made to  optimize nasal and oropharyngeal patency. - Positional therapy avoiding supine position during sleep. - If patient is symptomatic with restless  legs consider a trial of pharmocotherapy for treatment of Periodic Leg Movements of Sleep. - Avoid alcohol, sedatives and other CNS depressants that may worsen sleep apnea and disrupt normal sleep architecture. - Sleep hygiene should be reviewed to assess factors that may improve sleep quality. - Weight management (BMI 35) and regular exercise should be initiated or continued if appropriate.  [Electronically signed] 12/06/2019 07:14 AM  Shelva Majestic MD, [facility], [place], American Board of Sleep Medicine   NPI: [id number] [place] PH: [phone]   FX: [phone] [place]

## 2019-12-07 ENCOUNTER — Ambulatory Visit: Payer: Self-pay | Admitting: *Deleted

## 2019-12-07 ENCOUNTER — Telehealth: Payer: Self-pay | Admitting: *Deleted

## 2019-12-07 LAB — GLUCOSE, CAPILLARY
Glucose-Capillary: 76 mg/dL (ref 70–99)
Glucose-Capillary: 132 mg/dL — ABNORMAL HIGH (ref 70–99)
Glucose-Capillary: 119 mg/dL — ABNORMAL HIGH (ref 70–99)
Glucose-Capillary: 89 mg/dL (ref 70–99)

## 2019-12-07 MED ORDER — METHOCARBAMOL 500 MG PO TABS
500.0000 mg | ORAL_TABLET | Freq: Three times a day (TID) | ORAL | Status: DC
  Administered 2019-12-07 – 2019-12-08 (×3): 500 mg via ORAL
  Filled 2019-12-07 (×4): qty 1

## 2019-12-07 NOTE — Telephone Encounter (Signed)
Informed patient of sleep study results and patient understanding was verbalized. Patient understands his sleep study showed No significant obstructive sleep apnea overall (AHI 1.9/h); At present patient does not meet critieria for CPAP as long as supine sleep is avoided. Pt is aware and agreeable to normal results.

## 2019-12-07 NOTE — Telephone Encounter (Signed)
-----   Message from Lennette Bihari, MD sent at 12/06/2019  7:21 AM EDT ----- Burna Mortimer, please notify pt of results.

## 2019-12-07 NOTE — Plan of Care (Signed)

## 2019-12-07 NOTE — Progress Notes (Signed)
PROGRESS NOTE    Brandon Sanchez  EQA:[id number] DOB: 03-Sep-1945 DOA: 12/03/2019 PCP: Merri Brunette, MD   Brief Narrative: 74 year old male with past medical history of diabetes mellitus type 2, paroxysmal atrial fibrillation, hyperlipidemia, hypertension who presents to [facility] emergency department with left-sided weakness and left facial droop.  Patient was brought to the ER, code stroke on arrival, seen by neurology CT head and CT angiogram head and neck done and due to timeline of symptoms and patient's already being on Eliquis TPA was not administered and patient was admitted for further management. 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  Is able to move his leg and improving, seen by neurology.  Continues to get PT OT and plan is for CIR when bed available.  Subjective:  On bedside chair C/o Left foot spasm and painful. 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  Patient completed stroke work-up with MRI-right BG/CR is small infarcts, MRA head normal, no emergent large vessel occlusion on CTA head and neck- see findings below.  With EF 55 to 60% 2, hemoglobin A1c at 7.6.  Uncontrolled type 2 diabetes mellitus with hypoglycemia without coma, with long-term current use of insulin: Hemoglobin A1c 7.6, blood sugar controlled on home regimen of NovoLog.  Was  hypoglycemic on arrival needing dextrose. Recent Labs  Lab 12/06/19 0611 12/06/19 1116 12/06/19 1700 12/06/19 2113 12/07/19 0608  GLUCAP 72 116* 113* 94 76   Left foot spasm will place on Robaxin 3 times daily x2 days.  Essential hypertension: BP well  controlled on Lasix.  Metoprolol on hold-and discontinued due to hypotension and fluctuating neuro symptoms, Lasix dose has been cut down to 40.  Avoid low blood pressure.  Paroxysmal atrial fibrillation: Continue Eliquis, amiodarone.  Rate is controlled and in sinus rhythm.  Hyperlipidemia on Lipitor.  Morbid obesity with BMI 34: He will benefit with weight loss and healthy lifestyle.  Mildly elevated creatinine 1.29.  Monitor intermittently.  Anemia likely from chronic disease.  Hemoglobin is stable.  DVT prophylaxis: eliquis Code Status:full Family Communication: plan of care discussed with patient at bedside. Status is: Inpatient  Remains inpatient appropriate because:Inpatient level of care appropriate due to severity of illness  Dispo: The patient is from: Home              Anticipated d/c is to: CIR awaiting placement.              Anticipated d/c date is: 1 day              Patient currently is medically stable to d/c. Nutrition: Diet Order            Diet heart healthy/carb modified Room service appropriate? Yes; Fluid consistency: Thin  Diet effective now             Body mass index is 34.99 kg/m.  Consultants:see note  Procedures:  CT Code Stroke CTA Head W/WO contrast CT Code Stroke CTA Neck W/WO contrast 12/03/2019 IMPRESSION:  1. No emergent large vessel occlusion  2. Atherosclerotic changes in the distal common carotid arteries and at the carotid bifurcations bilaterally significant stenosis.  3. Aortic Atherosclerosis (  ICD10-I70.0).  4. Degenerative changes of the cervical spine including fusion at C3-4 and C6-7.  CT HEAD CODE STROKE WO CONTRAST 12/03/2019 IMPRESSION:  1. No acute intracranial abnormality.  2. Mild generalized atrophy and white matter disease. This likely reflects the sequela of chronic microvascular ischemia.  3. ASPECTS is 10/10.  4. Mild sinus disease.   MRI / MRA Head 1. Small acute posterior right basal ganglia/corona  radiata infarct. 2. Mild chronic small vessel ischemic disease. 3. Negative head MRA.  DG Chest Port 1 View 12/04/2019 IMPRESSION: Left basilar veiling opacity consistent with consolidation and/or Effusion.   Transthoracic Echocardiogram  1. Left ventricular ejection fraction, by estimation, is 55 to 60%. The  left ventricle has normal function. The left ventricle has no regional  wall motion abnormalities. The left ventricular internal cavity size was  mildly dilated. There is mild  concentric left ventricular hypertrophy. Left ventricular diastolic  parameters are consistent with Grade III diastolic dysfunction  (restrictive). Elevated left ventricular end-diastolic pressure.  2. Right ventricular systolic function is normal. The right ventricular  size is mildly enlarged.  3. Left atrial size was mildly dilated.  4. Right atrial size was mild to moderately dilated.  5. The mitral valve is grossly normal. Trivial mitral valve  regurgitation. No evidence of mitral stenosis.  6. The aortic valve is tricuspid. Aortic valve regurgitation is mild to  moderate. Moderate aortic valve stenosis.  7. The inferior vena cava is dilated in size with >50% respiratory  variability, suggesting right atrial pressure of 8 mmHg.    Microbiology:see note  Medications: Scheduled Meds: . amiodarone  200 mg Oral Daily  . apixaban  5 mg Oral BID  . aspirin EC  81 mg Oral Daily  . atorvastatin  80 mg Oral Daily  . furosemide  40 mg Oral Daily  . insulin aspart  0-15 Units Subcutaneous TID AC & HS  . insulin aspart protamine- aspart  20 Units Subcutaneous BID WC  . pantoprazole  40 mg Oral Daily  . sertraline  100 mg Oral Daily   Continuous Infusions: . sodium chloride 75 mL/hr at 12/06/19 1145    Antimicrobials: Anti-infectives (From admission, onward)   None       Objective: Vitals: Today's Vitals   12/06/19 1935 12/06/19 2357 12/07/19 0303 12/07/19 0900  BP: (!) 118/59  (!) 123/58 (!) 106/58 110/62  Pulse: 70 65 65 67  Resp: 20 20 18 18   Temp: 98 F (36.7 C) 98.2 F (36.8 C) 98 F (36.7 C) 98 F (36.7 C)  TempSrc: Oral Oral Oral Oral  SpO2: 98% 95% 96% 97%  Weight:      Height:      PainSc:    0-No pain    Intake/Output Summary (Last 24 hours) at 12/07/2019 1032 Last data filed at 12/07/2019 0800 Gross per 24 hour  Intake 2982.94 ml  Output 2575 ml  Net 407.94 ml   Filed Weights   12/03/19 2142 12/04/19 0400  Weight: 125.2 kg 123.6 kg   Weight change:    Intake/Output from previous day: 04/19 0701 - 04/20 0700 In: 360 [P.O.:360] Out: 2575 [Urine:2575] Intake/Output this shift: Total I/O In: 2742.9 [I.V.:2742.9] Out: -   Examination:  General exam: AAOx3 , NAD,  HEENT:Oral mucosa moist, Ear/Nose WNL grossly,dentition normal. Respiratory system: bilaterally clear,no wheezing or crackles,no use of accessory muscle, non tender. Cardiovascular system: S1 & S2 +, regular, No JVD. Gastrointestinal system: Abdomen soft, NT,ND, BS+. Nervous System:Alert, awake, moving extremities and  mild weakness on left leg. Extremities: No edema, distal peripheral pulses palpable.  Skin: No rashes,no icterus. MSK: Normal muscle bulk,tone, power  Data Reviewed: I have personally reviewed following labs and imaging studies CBC: Recent Labs  Lab 12/03/19 2109 12/03/19 2112 12/05/19 0400 12/06/19 0411  WBC  --  13.8* 10.0 10.1  NEUTROABS  --  8.2*  --   --   HGB 12.2* 10.9* 10.0* 9.6*  HCT 36.0* 36.3* 33.1* 31.2*  MCV  --  89.4 88.7 88.4  PLT  --  393 350 354   Basic Metabolic Panel: Recent Labs  Lab 12/03/19 2109 12/03/19 2112 12/05/19 0400 12/06/19 0411  NA 142 137 143 144  K 3.9 3.9 3.3* 3.7  CL 100 97* 105 104  CO2  --  25 30 29   GLUCOSE 74 74 78 75  BUN 30* 27* 16 16  CREATININE 1.30* 1.44* 1.12 1.29*  CALCIUM  --  9.1 9.0 9.0  MG  --   --  2.1 2.1  PHOS  --   --  3.9 4.2   GFR: Estimated Creatinine Clearance: 72.3 mL/min  (A) (by C-G formula based on SCr of 1.29 mg/dL (H)). Liver Function Tests: Recent Labs  Lab 12/03/19 2112  AST 41  ALT 39  ALKPHOS 78  BILITOT 0.5  PROT 7.2  ALBUMIN 2.8*   No results for input(s): LIPASE, AMYLASE in the last 168 hours. No results for input(s): AMMONIA in the last 168 hours. Coagulation Profile: Recent Labs  Lab 12/03/19 2150  INR 1.2   Cardiac Enzymes: No results for input(s): CKTOTAL, CKMB, CKMBINDEX, TROPONINI in the last 168 hours. BNP (last 3 results) No results for input(s): PROBNP in the last 8760 hours. HbA1C: No results for input(s): HGBA1C in the last 72 hours. CBG: Recent Labs  Lab 12/06/19 0611 12/06/19 1116 12/06/19 1700 12/06/19 2113 12/07/19 0608  GLUCAP 72 116* 113* 94 76   Lipid Profile: No results for input(s): CHOL, HDL, LDLCALC, TRIG, CHOLHDL, LDLDIRECT in the last 72 hours. Thyroid Function Tests: No results for input(s): TSH, T4TOTAL, FREET4, T3FREE, THYROIDAB in the last 72 hours. Anemia Panel: No results for input(s): VITAMINB12, FOLATE, FERRITIN, TIBC, IRON, RETICCTPCT in the last 72 hours. Sepsis Labs: No results for input(s): PROCALCITON, LATICACIDVEN in the last 168 hours.  Recent Results (from the past 240 hour(s))  SARS CORONAVIRUS 2 (TAT 6-24 HRS) Nasopharyngeal Nasopharyngeal Swab     Status: None   Collection Time: 12/04/19  1:00 AM   Specimen: Nasopharyngeal Swab  Result Value Ref Range Status   SARS Coronavirus 2 NEGATIVE NEGATIVE Final    Comment: (NOTE) SARS-CoV-2 target nucleic acids are NOT DETECTED. The SARS-CoV-2 RNA is generally detectable in upper and lower respiratory specimens during the acute phase of infection. Negative results do not preclude SARS-CoV-2 infection, do not rule out co-infections with other pathogens, and should not be used as the sole basis for treatment or other patient management decisions. Negative results must be combined with clinical observations, patient history, and  epidemiological information. The expected result is Negative. Fact Sheet for Patients: 12/06/19 Fact Sheet for Healthcare Providers: HairSlick.no This test is not yet approved or cleared by the quierodirigir.com FDA and  has been authorized for detection and/or diagnosis of SARS-CoV-2 by FDA under an Emergency Use Authorization (EUA). This EUA will remain  in effect (meaning this test can be used) for the duration of the COVID-19 declaration under Section 56 4(b)(1) of the Act, 21 U.S.C. section 360bbb-3(b)(1),  unless the authorization is terminated or revoked sooner. Performed at [facility], [place] [address], [place], [place] [address]       Radiology Studies: No results found.   LOS: 3 days   Time spent: More than 50% of that time was spent in counseling and/or coordination of care.  Antonieta Pert, MD [facility]  12/07/2019, 10:32 AM

## 2019-12-07 NOTE — Progress Notes (Signed)
Inpatient Rehabilitation-Admissions Coordinator   I do not have a bed available in CIR today for this patient. Will follow up tomorrow.   Cheri Rous, OTR/L  Rehab Admissions Coordinator  [phone] 12/07/2019 11:04 AM

## 2019-12-07 NOTE — Progress Notes (Signed)
Physical Therapy Treatment Patient Details Name: Brandon Sanchez MRN: [id number] DOB: 03/09/1946 Today's Date: 12/07/2019    History of Present Illness 74 year old male with past medical history of diabetes mellitus type 2, paroxysmal atrial fibrillation, hyperlipidemia, hypertension who presents to [facility] emergency department with left-sided weakness and left facial droop.. 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 Continue to recommend CIR for further skilled PT services to maximize independence and safety with mobility.    Follow Up Recommendations  CIR;Supervision/Assistance - 24 hour     Equipment Recommendations  (defer to post acute)    Recommendations for Other Services       Precautions / Restrictions Precautions Precautions: Fall Precaution Comments: L hemiplegia Restrictions Weight Bearing Restrictions: No    Mobility  Bed Mobility Overal bed mobility: Needs Assistance Bed Mobility: Supine to Sit     Supine to sit: Min guard     General bed mobility comments: use of rail and HOB elevated; increased time needed   Transfers Overall transfer level: Needs assistance Equipment used: Ambulation equipment used;Rolling walker (2 wheeled) Transfers: Sit to/from Stand Sit to Stand: Min assist;Mod assist         General transfer comment: initial transfer bed to recliner Stedy standing frame utilized; second stand from recliner mod A to power up into standing with cues for hand placement   Ambulation/Gait Ambulation/Gait assistance: Min assist;Mod assist;+2 safety/equipment Gait Distance (Feet): 40 Feet Assistive device: Rolling walker (2 wheeled) Gait Pattern/deviations: Trunk flexed;Step-through pattern;Decreased step length - left;Decreased stride length;Decreased dorsiflexion - left Gait velocity:  decreased   General Gait Details: multimodal cues for upright posture and weight shifting; increased L lateral bias with distance ; assist required for balance/weight shifing    Stairs             Wheelchair Mobility    Modified Rankin (Stroke Patients Only) Modified Rankin (Stroke Patients Only) Pre-Morbid Rankin Score: Moderate disability Modified Rankin: Moderately severe disability     Balance Overall balance assessment: Needs assistance Sitting-balance support: Feet supported Sitting balance-Leahy Scale: Good     Standing balance support: Bilateral upper extremity supported;During functional activity Standing balance-Leahy Scale: Poor                              Cognition Arousal/Alertness: Awake/alert Behavior During Therapy: WFL for tasks assessed/performed Overall Cognitive Status: No family/caregiver present to determine baseline cognitive functioning Area of Impairment: Attention;Problem solving                   Current Attention Level: Selective         Problem Solving: Slow processing        Exercises      General Comments        Pertinent Vitals/Pain Pain Assessment: Faces Faces Pain Scale: Hurts a little bit Pain Location: B knees (chronic) and L foot (reports cramps at night L foot) Pain Descriptors / Indicators: Sore;Guarding Pain Intervention(s): Limited activity within patient's tolerance;Monitored during session;Repositioned    Home Living                      Prior Function            PT Goals (current goals can now be found in the care plan section) Progress towards  PT goals: Progressing toward goals    Frequency    Min 4X/week      PT Plan Current plan remains appropriate    Co-evaluation              AM-PAC PT "6 Clicks" Mobility   Outcome Measure  Help needed turning from your back to your side while in a flat bed without using bedrails?: A Little Help needed moving from  lying on your back to sitting on the side of a flat bed without using bedrails?: A Little Help needed moving to and from a bed to a chair (including a wheelchair)?: A Little Help needed standing up from a chair using your arms (e.g., wheelchair or bedside chair)?: A Lot Help needed to walk in hospital room?: A Lot Help needed climbing 3-5 steps with a railing? : Total 6 Click Score: 14    End of Session Equipment Utilized During Treatment: Gait belt Activity Tolerance: Patient tolerated treatment well Patient left: with call bell/phone within reach;in chair;with chair alarm set Nurse Communication: Mobility status PT Visit Diagnosis: Difficulty in walking, not elsewhere classified (R26.2);Muscle weakness (generalized) (M62.81);Hemiplegia and hemiparesis Hemiplegia - Right/Left: Left Hemiplegia - dominant/non-dominant: Non-dominant Hemiplegia - caused by: Cerebral infarction     Time: 0998-3382 PT Time Calculation (min) (ACUTE ONLY): 26 min  Charges:  $Gait Training: 23-37 mins                     Erline Levine, PTA Acute Rehabilitation Services Pager: [phone] Office: [phone]     Carolynne Edouard 12/07/2019, 12:04 PM

## 2019-12-07 NOTE — PMR Pre-admission (Addendum)
PMR Admission Coordinator Pre-Admission Assessment  Patient: Brandon Sanchez is an 74 y.o., male MRN: [id number] DOB: 08-30-1945 Height: 6' 2"  (188 cm) Weight: 123.6 kg              Insurance Information HMO:     PPO:      PCP:      IPA:      80/20: yes     OTHER:  PRIMARY: Medicare Part A and B       Policy#:[id number]       Subscriber: patient CM Name:       Phone#:      Fax#:  Pre-Cert#:       Employer:  Benefits:  Phone #: online     Name: verified eligibility online via [place] on 12/08/19 Eff. Date: part A effective 12/18/11, Part B effective 05/19/12     Deduct: $1,484      Out of Pocket Max: NA      Life Max: NA  CIR: Covered per Medicare guidelines once yearly deductible has been met      SNF: days 1-20, 100%; days 21-100, 80% Outpatient: 80%     Co-Pay: 20% Home Health: 100%      Co-Pay:  DME: 80%     Co-Pay: 20% Providers: Pt's choice SECONDARY: BCBS      Policy#: [id number]      Phone#: [phone]  Financial Counselor:      Phone#:   The "Data Collection Information Summary" for patients in Inpatient Rehabilitation Facilities with attached "Privacy Act [place] Records" was provided and verbally reviewed with: Patient  Emergency Contact Information Contact Information    Name Relation Home Work Mobile   [place] L Daughter [phone]  [phone]     Current Medical History  Patient Admitting Diagnosis:  Left sided weakness and facial droop secondary to 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  History of Present Illness: Pt is a 63 you male with history of DM type 2, paroxysmal atrial fibrillation, hyperlipidemia, and hypertension who presented to [facility] ED on 12/03/19 with left-sided weakness and left facial droop. TPA was not administered due to timeline. 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 Pt with uncontrolled type 2 DM  and was hypoglycemic on arrival requiring dextrose. Pt's BP has been controlled on lasix and his heart rate controlled and in sinus rhythm. Pt has had some mildly elevated creatine at 1.29, with suggestion to monitor intermittently.   Complete NIHSS TOTAL: 0 Glasgow Coma Scale Score: 15  Past Medical History  Past Medical History:  Diagnosis Date  . Allergic rhinitis, cause unspecified   . Asthma   . Cataract cortical, senile, bilateral   . Macular degeneration of left eye   . Other and unspecified hyperlipidemia   . Type II or unspecified type diabetes mellitus without mention of complication, not stated as uncontrolled   . Unspecified essential hypertension     Family History  family history includes Colon cancer in his father; Heart disease in his paternal grandfather.  Prior Rehab/Hospitalizations:  Has the patient had prior rehab or hospitalizations prior to admission? Yes  Has the patient had major surgery during 100 days prior to admission? Yes  Current Medications   Current Facility-Administered Medications:  .  acetaminophen (TYLENOL) tablet 650 mg, 650 mg, Oral, Q4H PRN, 650 mg at 12/09/19 2146 **OR** acetaminophen (TYLENOL) 160 MG/5ML solution 650 mg, 650 mg, Per  Tube, Q4H PRN **OR** acetaminophen (TYLENOL) suppository 650 mg, 650 mg, Rectal, Q4H PRN, Shalhoub, Sherryll Burger, MD .  albuterol (PROVENTIL) (2.5 MG/3ML) 0.083% nebulizer solution 3 mL, 3 mL, Inhalation, Q4H PRN, Shalhoub, Sherryll Burger, MD .  amiodarone (PACERONE) tablet 200 mg, 200 mg, Oral, Daily, Shalhoub, Sherryll Burger, MD, 200 mg at 12/10/19 1022 .  apixaban (ELIQUIS) tablet 5 mg, 5 mg, Oral, BID, Shalhoub, Sherryll Burger, MD, 5 mg at 12/10/19 1023 .  aspirin EC tablet 81 mg, 81 mg, Oral, Daily, Rosalin Hawking, MD, 81 mg at 12/10/19 1022 .  atorvastatin (LIPITOR) tablet 80 mg, 80 mg, Oral, Daily, Shalhoub, Sherryll Burger, MD, 80 mg at 12/10/19 1023 .  fluticasone furoate-vilanterol (BREO ELLIPTA) 200-25 MCG/INH 1 puff, 1 puff,  Inhalation, Daily PRN, Shalhoub, Sherryll Burger, MD .  insulin aspart (novoLOG) injection 0-15 Units, 0-15 Units, Subcutaneous, TID AC & HS, Shalhoub, Sherryll Burger, MD, 2 Units at 12/09/19 1109 .  insulin aspart protamine- aspart (NOVOLOG MIX 70/30) injection 16 Units, 16 Units, Subcutaneous, BID WC, Lavina Hamman, MD, 16 Units at 12/10/19 1023 .  methocarbamol (ROBAXIN) tablet 500 mg, 500 mg, Oral, Q8H PRN, Lavina Hamman, MD, 500 mg at 12/08/19 2005 .  ondansetron (ZOFRAN) injection 4 mg, 4 mg, Intravenous, Q6H PRN, Shalhoub, Sherryll Burger, MD .  pantoprazole (PROTONIX) EC tablet 40 mg, 40 mg, Oral, Daily, Shalhoub, Sherryll Burger, MD, 40 mg at 12/10/19 1022 .  polyethylene glycol (MIRALAX / GLYCOLAX) packet 17 g, 17 g, Oral, Daily, Lavina Hamman, MD, 17 g at 12/08/19 1206 .  senna-docusate (Senokot-S) tablet 1 tablet, 1 tablet, Oral, BID, Lavina Hamman, MD, 1 tablet at 12/10/19 1022 .  sertraline (ZOLOFT) tablet 100 mg, 100 mg, Oral, Daily, Shalhoub, Sherryll Burger, MD, 100 mg at 12/10/19 1023  Patients Current Diet:  Diet Order            Diet heart healthy/carb modified Room service appropriate? Yes; Fluid consistency: Thin  Diet effective now              Precautions / Restrictions Precautions Precautions: Fall Precaution Comments: L hemiplegia Restrictions Weight Bearing Restrictions: No   Has the patient had 2 or more falls or a fall with injury in the past year?No  Prior Activity Level Community (5-7x/wk): not working or driving but did do his own IADLs, grocerty shopping  Prior Functional Level Prior Function Level of Independence: Needs assistance Gait / Transfers Assistance Needed: pt navigating stairs with slow step-to gait pattern with L foot leading with use of rails. reports no use of AD at home, use of driver and then [facility] for Dr visits ADL's / Homemaking Assistance Needed: pt reports home care service that does general home maintenance and cleaning, pt reports he does his own cooking  adn ADL  Self Care: Did the patient need help bathing, dressing, using the toilet or eating?  Independent  Indoor Mobility: Did the patient need assistance with walking from room to room (with or without device)? Independent  Stairs: Did the patient need assistance with internal or external stairs (with or without device)? Independent  Functional Cognition: Did the patient need help planning regular tasks such as shopping or remembering to take medications? Independent  Home Assistive Devices / Equipment Home Assistive Devices/Equipment: Cane (specify quad or straight) Home Equipment: Cane - single point  Prior Device Use: Indicate devices/aids used by the patient prior to current illness, exacerbation or injury? None of the above  Current Functional Level Cognition  Arousal/Alertness: Awake/alert Overall Cognitive Status: No family/caregiver present to determine baseline cognitive functioning Current Attention Level: Selective Orientation Level: Oriented X4 Safety/Judgement: Decreased awareness of safety, Decreased awareness of deficits General Comments: Pt with improved engagement and motivation today Attention: Focused, Sustained Focused Attention: Appears intact Sustained Attention: Appears intact Memory: Appears intact(Immediate: 5/5; delayed: 5/5) Awareness: Appears intact Problem Solving: Appears intact Executive Function: Reasoning, Sequencing Reasoning: (Abstract: 2/2) Sequencing: Appears intact    Extremity Assessment (includes Sensation/Coordination)  Upper Extremity Assessment: Defer to OT evaluation LUE Deficits / Details: unable to lift off bed or move up abdomen toward mouth; gross grasp adn slow release lacking @ 30 degrees full extension. unable to use as functional assist LUE Sensation: decreased light touch LUE Coordination: decreased fine motor, decreased gross motor  Lower Extremity Assessment: LLE deficits/detail, RLE deficits/detail RLE Deficits /  Details: generally 4-/5, especially in knee and hip strength.pt with poor functional use for mobility in bed LLE Deficits / Details: generally 3-/5, able to initiate all movements through partial ROM LLE Sensation: decreased light touch LLE Coordination: decreased fine motor, decreased gross motor    ADLs  Overall ADL's : Needs assistance/impaired Eating/Feeding: Minimal assistance Grooming: Minimal assistance, Bed level Upper Body Bathing: Moderate assistance, Bed level Lower Body Bathing: Maximal assistance, Bed level Upper Body Dressing : Maximal assistance, Sitting Lower Body Dressing: Sitting/lateral leans, Min guard Lower Body Dressing Details (indicate cue type and reason): pt able to don sock on L with increased time using figure 4 position, able to demonstrate figure 4 on R as well Toilet Transfer: (will need lift equipment) Toileting- Clothing Manipulation and Hygiene: Total assistance Toileting - Clothing Manipulation Details (indicate cue type and reason): declined to try male urinal; only wanting condom cath Functional mobility during ADLs: Minimal assistance General ADL Comments: min A to complete LB dressing, session focusing on fine motor coordination of LUE    Mobility  Overal bed mobility: Modified Independent Bed Mobility: Supine to Sit Rolling: Min assist Sidelying to sit: Min guard Supine to sit: Min guard Sit to supine: Max assist General bed mobility comments: increased time and use of rail    Transfers  Overall transfer level: Needs assistance Equipment used: 1 person hand held assist Transfer via Lift Equipment: Stedy Transfers: Sit to/from Stand Sit to Stand: Min assist Squat pivot transfers: Total assist  Lateral/Scoot Transfers: Mod assist General transfer comment: assist to power up into standing and then to gain balance in standing without use of AD; HHA upon standing     Ambulation / Gait / Stairs / Wheelchair Mobility   Ambulation/Gait Ambulation/Gait assistance: Min assist, Mod assist Gait Distance (Feet): 100 Feet Assistive device: 1 person hand held assist(assist at trunk with gait belt) Gait Pattern/deviations: Decreased stride length, Step-through pattern, Decreased dorsiflexion - left General Gait Details: assist for balance; decreased bilat step lengths and very guarded movement Gait velocity: decreased    Posture / Balance Dynamic Sitting Balance Sitting balance - Comments: supervision at EOB Balance Overall balance assessment: Needs assistance Sitting-balance support: Feet supported Sitting balance-Leahy Scale: Good Sitting balance - Comments: supervision at EOB Postural control: Posterior lean, Left lateral lean Standing balance support: During functional activity, Single extremity supported Standing balance-Leahy Scale: Poor Standing balance comment: reliant on BUE support and external support High Level Balance Comments: Pt practiced multiple reaching balance tasks while standing in stedy at sink with mirror for visual feedback and bilateral reaching  for improved wt shifting bilaterally and standing balance.    Special needs/care  consideration Skin MASD to bilateral sacrum, Diabetic management: yes and Designated visitor are Carmell [place] and [place] (from acute therapy documentation) Living Arrangements: Alone Available Help at Discharge: Family, Other (Comment) Type of Home: House Home Layout: Two level, Bed/bath upstairs, 1/2 bath on main level Alternate Level Stairs-Rails: Right Alternate Level Stairs-Number of Steps: 17 with landing Home Access: Stairs to enter Entrance Stairs-Rails: Left Entrance Stairs-Number of Steps: 10 from parking spot to door Bathroom Shower/Tub: Tub/shower unit, Multimedia programmer: Handicapped height Bathroom Accessibility: Yes How Accessible: Accessible via walker [place]: No  Discharge Living  Setting Plans for Discharge Living Setting: Patient's home, Other (Comment)(will have assist from [place] and care agency) Type of Home at Discharge: House(townhouse) Discharge Home Layout: Two level, Bed/bath upstairs(1/2 bath downstairs) Alternate Level Stairs-Rails: Right Alternate Level Stairs-Number of Steps: 8 then landing, then 8 Discharge Home Access: Stairs to enter Entrance Stairs-Rails: Left Entrance Stairs-Number of Steps: 4-5 steps Discharge Bathroom Shower/Tub: Walk-in shower Discharge Bathroom Toilet: Standard Discharge Bathroom Accessibility: Yes How Accessible: Accessible via walker Does the patient have any problems obtaining your medications?: No  Social/Family/Support Systems Patient Roles: Other (Comment)(has daughter in [place] who he is close with) Contact Information: daughter Carmell [place]): [phone] Anticipated Caregiver: daughter + [facility] care agency Anticipated Caregiver's Contact Information: see above Ability/Limitations of Caregiver: Min A Caregiver Availability: 24/7 Discharge Plan Discussed with Primary Caregiver: Yes(pt and daughter) Is Caregiver In Agreement with Plan?: Yes Does Caregiver/Family have Issues with Lodging/Transportation while Pt is in Rehab?: No   Goals Patient/Family Goal for Rehab: PT: Mod I; OT: Mod I/Supervision; SLP: NA Expected length of stay: 7-10 days  Cultural Considerations: NA Pt/Family Agrees to Admission and willing to participate: Yes Program Orientation Provided & Reviewed with Pt/Caregiver Including Roles  & Responsibilities: Yes(pt and his daughter)  Barriers to Discharge: Home environment access/layout  Barriers to Discharge Comments: stairs to enter home; bed/bath upstairs. daughter lives in [place] but plans to arrange 24/7 A.    Decrease burden of Care through IP rehab admission: NA   Possible need for SNF placement upon discharge: Not anticipated. Pt has good support from his daughter who plans to stay with  him at DC with transition to [facility] care agency for assistance as needed. Anticipate pt can reach a Supervision/Min A level at DC which can be managed by his daughter.    Patient Condition: This patient's medical and functional status has changed since the consult dated: 12/06/19 in which the Rehabilitation Physician determined and documented that the patient's condition is appropriate for intensive rehabilitative care in an inpatient rehabilitation facility. See "History of Present Illness" (above) for medical update. Functional changes are: improvement in transfers from Mod A +2 to Min G/Min A, improvement in gait distance and assistance from Mod A +2 for 4 feet to Min G for 50 feet, and improvement in overall ADL completion from Min A to Total A to Min G to Min A. Patient's medical and functional status update has been discussed with the Rehabilitation physician and patient remains appropriate for inpatient rehabilitation. Will admit to inpatient rehab today.  Preadmission Screen Completed By: Raechel Ache with updates by  Cleatrice Burke, RN, 12/10/2019 11:04 AM ______________________________________________________________________   Discussed status with Dr. Posey Pronto on 4/23/2021at  1104 and received approval for admission today.  Admission [place] with updates by   Cleatrice Burke, time 1051 Date 12/10/2019

## 2019-12-08 LAB — COMPREHENSIVE METABOLIC PANEL
ALT: 20 U/L (ref 0–44)
AST: 18 U/L (ref 15–41)
Albumin: 2.5 g/dL — ABNORMAL LOW (ref 3.5–5.0)
Alkaline Phosphatase: 75 U/L (ref 38–126)
Anion gap: 8 (ref 5–15)
BUN: 13 mg/dL (ref 8–23)
CO2: 27 mmol/L (ref 22–32)
Calcium: 8.7 mg/dL — ABNORMAL LOW (ref 8.9–10.3)
Chloride: 104 mmol/L (ref 98–111)
Creatinine, Ser: 1.15 mg/dL (ref 0.61–1.24)
GFR calc Af Amer: >60 mL/min (ref >60)
GFR calc non Af Amer: >60 mL/min (ref >60)
Glucose, Bld: 144 mg/dL — ABNORMAL HIGH (ref 70–99)
Potassium: 3.7 mmol/L (ref 3.5–5.1)
Sodium: 139 mmol/L (ref 135–145)
Total Bilirubin: 0.7 mg/dL (ref 0.3–1.2)
Total Protein: 6.6 g/dL (ref 6.5–8.1)

## 2019-12-08 LAB — CBC WITH DIFFERENTIAL/PLATELET
Abs Immature Granulocytes: 0.06 10*3/uL (ref 0.00–0.07)
Basophils Absolute: 0 10*3/uL (ref 0.0–0.1)
Basophils Relative: 0 %
Eosinophils Absolute: 0.3 10*3/uL (ref 0.0–0.5)
Eosinophils Relative: 3 %
HCT: 32.4 % — ABNORMAL LOW (ref 39.0–52.0)
Hemoglobin: 9.9 g/dL — ABNORMAL LOW (ref 13.0–17.0)
Immature Granulocytes: 1 %
Lymphocytes Relative: 30 %
Lymphs Abs: 3.1 10*3/uL (ref 0.7–4.0)
MCH: 26.9 pg (ref 26.0–34.0)
MCHC: 30.6 g/dL (ref 30.0–36.0)
MCV: 88 fL (ref 80.0–100.0)
Monocytes Absolute: 0.8 10*3/uL (ref 0.1–1.0)
Monocytes Relative: 7 %
Neutro Abs: 6.2 10*3/uL (ref 1.7–7.7)
Neutrophils Relative %: 59 %
Platelets: 347 10*3/uL (ref 150–400)
RBC: 3.68 MIL/uL — ABNORMAL LOW (ref 4.22–5.81)
RDW: 14.7 % (ref 11.5–15.5)
WBC: 10.5 10*3/uL (ref 4.0–10.5)
nRBC: 0 % (ref 0.0–0.2)

## 2019-12-08 LAB — GLUCOSE, CAPILLARY
Glucose-Capillary: 68 mg/dL — ABNORMAL LOW (ref 70–99)
Glucose-Capillary: 86 mg/dL (ref 70–99)
Glucose-Capillary: 139 mg/dL — ABNORMAL HIGH (ref 70–99)
Glucose-Capillary: 136 mg/dL — ABNORMAL HIGH (ref 70–99)
Glucose-Capillary: 76 mg/dL (ref 70–99)

## 2019-12-08 MED ORDER — METHOCARBAMOL 500 MG PO TABS
500.0000 mg | ORAL_TABLET | Freq: Three times a day (TID) | ORAL | Status: DC | PRN
  Administered 2019-12-08: 500 mg via ORAL
  Filled 2019-12-08: qty 1

## 2019-12-08 MED ORDER — INSULIN ASPART PROT & ASPART (70-30 MIX) 100 UNIT/ML ~~LOC~~ SUSP
16.0000 [IU] | Freq: Two times a day (BID) | SUBCUTANEOUS | Status: DC
  Administered 2019-12-08 – 2019-12-10 (×5): 16 [IU] via SUBCUTANEOUS
  Filled 2019-12-08: qty 10

## 2019-12-08 MED ORDER — POLYETHYLENE GLYCOL 3350 17 G PO PACK
17.0000 g | PACK | Freq: Every day | ORAL | Status: DC
  Administered 2019-12-08: 17 g via ORAL
  Filled 2019-12-08 (×3): qty 1

## 2019-12-08 MED ORDER — SENNOSIDES-DOCUSATE SODIUM 8.6-50 MG PO TABS
1.0000 | ORAL_TABLET | Freq: Two times a day (BID) | ORAL | Status: DC
  Administered 2019-12-08 – 2019-12-10 (×3): 1 via ORAL
  Filled 2019-12-08 (×4): qty 1

## 2019-12-08 NOTE — Progress Notes (Signed)
Physical Therapy Treatment Patient Details Name: MARKELLE NAJARIAN MRN: [id number] DOB: [birth year] Today's Date: 12/08/2019    History of Present Illness 74 year old male with past medical history of diabetes mellitus type 2, paroxysmal atrial fibrillation, hyperlipidemia, hypertension who presents to [facility] emergency department with left-sided weakness and left facial droop.. 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  He required less assistance for transfers and able to increase gait.  Still required cues for posture and L LE with gait, but making good progress.  Pt with good participation with standing exercises with RW and min guard for balance.  Pt remains below baseline and fall risk - cont to recommend rehab at d/c.     Follow Up Recommendations  CIR;Supervision/Assistance - 24 hour     Equipment Recommendations  Rolling walker with 5" wheels;Other (comment)(to be further assessed post acute)    Recommendations for Other Services Rehab consult     Precautions / Restrictions Precautions Precautions: Fall Precaution Comments: L hemiplegia    Mobility  Bed Mobility Overal bed mobility: Needs Assistance Bed Mobility: Supine to Sit     Supine to sit: Min guard     General bed mobility comments: use of rail and HOB elevated; increased time needed   Transfers Overall transfer level: Needs assistance Equipment used: Rolling walker (2 wheeled) Transfers: Sit to/from Stand Sit to Stand: Min guard;Min assist         General transfer comment: Multiple sit to stands throughout treatment and pt with good hand placement.  Required min guard for sit to stand from bed and for 5x sit to stand from chair.  Additionally, performed 2 time sit to stand with R leg extended forward slightly to favor L leg - required min A  Ambulation/Gait Ambulation/Gait assistance: Min guard Gait Distance (Feet):  50 Feet Assistive device: Rolling walker (2 wheeled) Gait Pattern/deviations: Decreased stride length;Step-through pattern;Decreased dorsiflexion - left Gait velocity: decreased   General Gait Details: verbal and tactile cues for posture and looking out; verbal cues to increase heel strike on L   Stairs             Wheelchair Mobility    Modified Rankin (Stroke Patients Only) Modified Rankin (Stroke Patients Only) Pre-Morbid Rankin Score: Moderate disability Modified Rankin: Moderately severe disability     Balance Overall balance assessment: Needs assistance Sitting-balance support: Feet supported Sitting balance-Leahy Scale: Good     Standing balance support: Bilateral upper extremity supported;During functional activity Standing balance-Leahy Scale: Poor Standing balance comment: reliant on BUE support and external support                            Cognition Arousal/Alertness: Awake/alert Behavior During Therapy: WFL for tasks assessed/performed Overall Cognitive Status: Within Functional Limits for tasks assessed                                        Exercises General Exercises - Lower Extremity Hip Flexion/Marching: AROM;Both;20 reps;Standing(2x10) Heel Raises: AROM;Standing;Both;20 reps(2x10)    General Comments        Pertinent Vitals/Pain Pain Assessment: Faces Faces Pain Scale: Hurts a little bit Pain Location: B knees (chronic) and L foot (reports cramps at night L foot) Pain Descriptors / Indicators: Sore;Guarding Pain Intervention(s): Limited activity within patient's  tolerance;Monitored during session;Repositioned    Home Living                      Prior Function            PT Goals (current goals can now be found in the care plan section) Acute Rehab PT Goals Patient Stated Goal: to get stronger PT Goal Formulation: With patient Time For Goal Achievement: 12/18/19 Potential to Achieve Goals:  Good Progress towards PT goals: Progressing toward goals    Frequency    Min 4X/week      PT Plan Current plan remains appropriate    Co-evaluation              AM-PAC PT "6 Clicks" Mobility   Outcome Measure  Help needed turning from your back to your side while in a flat bed without using bedrails?: A Little Help needed moving from lying on your back to sitting on the side of a flat bed without using bedrails?: A Little Help needed moving to and from a bed to a chair (including a wheelchair)?: A Little Help needed standing up from a chair using your arms (e.g., wheelchair or bedside chair)?: A Little Help needed to walk in hospital room?: A Little Help needed climbing 3-5 steps with a railing? : A Lot 6 Click Score: 17    End of Session Equipment Utilized During Treatment: Gait belt Activity Tolerance: Patient tolerated treatment well Patient left: with call bell/phone within reach;in chair;with chair alarm set Nurse Communication: Mobility status PT Visit Diagnosis: Difficulty in walking, not elsewhere classified (R26.2);Muscle weakness (generalized) (M62.81);Hemiplegia and hemiparesis Hemiplegia - Right/Left: Left Hemiplegia - dominant/non-dominant: Non-dominant Hemiplegia - caused by: Cerebral infarction     Time: 1525-1600 PT Time Calculation (min) (ACUTE ONLY): 35 min  Charges:  $Gait Training: 8-22 mins $Therapeutic Exercise: 8-22 mins                     Royetta Asal, PT Acute Rehab Services Pager [phone] [facility] [phone] [facility] [phone]    Rayetta Humphrey 12/08/2019, 4:18 PM

## 2019-12-08 NOTE — Progress Notes (Signed)
PROGRESS NOTE    Brandon Sanchez  MVH:[id number] DOB: November 06, 1945 DOA: 12/03/2019 PCP: Merri Brunette, MD   Brief Narrative: 74 year old male with past medical history of diabetes mellitus type 2, paroxysmal atrial fibrillation, hyperlipidemia, hypertension who presents to [facility] emergency department with left-sided weakness and left facial droop.  Patient was brought to the ER, code stroke on arrival, seen by neurology CT head and CT angiogram head and neck done and due to timeline of symptoms and patient's already being on Eliquis TPA was not administered and patient was admitted for further management. 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  Is able to move his leg and improving, seen by neurology.  Continues to get PT OT and plan is for CIR when bed available.  Subjective:  No acute events.  Occasionally patient's blood pressure dropped down but remains asymptomatic.  No nausea no vomiting.  No chest pain.  No fever no chills.  Reports spasm of his leg more left more than right.  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  Patient completed stroke work-up with MRI-right BG/CR is small infarcts, MRA head normal, no emergent large vessel occlusion on CTA head and neck- see findings below.  With EF 55 to 60% 2, hemoglobin A1c at 7.6.  Uncontrolled type 2 diabetes mellitus with hypoglycemia without coma, with long-term current use of insulin: Hemoglobin A1c 7.6, blood sugar controlled on home regimen of NovoLog.  Was  hypoglycemic on arrival needing dextrose. Recent Labs  Lab 12/07/19 2221 12/08/19 0559 12/08/19 0625 12/08/19 1118 12/08/19 1637  GLUCAP 89 68* 86 139* 136*   Left foot spasm will place on Robaxin 3 times daily x2 days.  Essential  hypertension: BP well controlled on Lasix.  Metoprolol on hold-and discontinued due to hypotension and fluctuating neuro symptoms, Lasix dose has been cut down to 40.  Avoid low blood pressure.  Paroxysmal atrial fibrillation: Continue Eliquis, amiodarone.  Rate is controlled and in sinus rhythm.  Hyperlipidemia on Lipitor.  Morbid obesity with BMI 34: He will benefit with weight loss and healthy lifestyle.  Mildly elevated creatinine 1.29.  Monitor intermittently.  Anemia likely from chronic disease.  Hemoglobin is stable.  DVT prophylaxis: eliquis Code Status:full Family Communication: plan of care discussed with patient at bedside. Status is: Inpatient  Remains inpatient appropriate because:Inpatient level of care appropriate due to severity of illness  Dispo: The patient is from: Home              Anticipated d/c is to: CIR awaiting placement.              Anticipated d/c date is: 1 day              Patient currently is medically stable to d/c. Nutrition: Diet Order            Diet heart healthy/carb modified Room service appropriate? Yes; Fluid consistency: Thin  Diet effective now             Body mass index is 34.99 kg/m.  Consultants:see note  Procedures:  CT Code Stroke CTA Head W/WO contrast CT Code Stroke CTA Neck W/WO contrast 12/03/2019 IMPRESSION:  1. No emergent large vessel occlusion  2. Atherosclerotic changes in the distal common carotid arteries and at the carotid bifurcations bilaterally significant stenosis.  3. Aortic Atherosclerosis (ICD10-I70.0).  4. Degenerative changes of the cervical spine including fusion at C3-4 and C6-7.  CT HEAD CODE STROKE WO CONTRAST 12/03/2019 IMPRESSION:  1. No acute intracranial abnormality.  2. Mild generalized atrophy and white matter disease. This likely reflects the sequela of chronic microvascular ischemia.  3. ASPECTS is 10/10.  4. Mild sinus disease.   MRI / MRA Head 1. Small acute posterior right basal  ganglia/corona radiata infarct. 2. Mild chronic small vessel ischemic disease. 3. Negative head MRA.  DG Chest Port 1 View 12/04/2019 IMPRESSION: Left basilar veiling opacity consistent with consolidation and/or Effusion.   Transthoracic Echocardiogram  1. Left ventricular ejection fraction, by estimation, is 55 to 60%. The  left ventricle has normal function. The left ventricle has no regional  wall motion abnormalities. The left ventricular internal cavity size was  mildly dilated. There is mild  concentric left ventricular hypertrophy. Left ventricular diastolic  parameters are consistent with Grade III diastolic dysfunction  (restrictive). Elevated left ventricular end-diastolic pressure.  2. Right ventricular systolic function is normal. The right ventricular  size is mildly enlarged.  3. Left atrial size was mildly dilated.  4. Right atrial size was mild to moderately dilated.  5. The mitral valve is grossly normal. Trivial mitral valve  regurgitation. No evidence of mitral stenosis.  6. The aortic valve is tricuspid. Aortic valve regurgitation is mild to  moderate. Moderate aortic valve stenosis.  7. The inferior vena cava is dilated in size with >50% respiratory  variability, suggesting right atrial pressure of 8 mmHg.    Microbiology:see note  Medications: Scheduled Meds: . amiodarone  200 mg Oral Daily  . apixaban  5 mg Oral BID  . aspirin EC  81 mg Oral Daily  . atorvastatin  80 mg Oral Daily  . insulin aspart  0-15 Units Subcutaneous TID AC & HS  . insulin aspart protamine- aspart  16 Units Subcutaneous BID WC  . pantoprazole  40 mg Oral Daily  . polyethylene glycol  17 g Oral Daily  . senna-docusate  1 tablet Oral BID  . sertraline  100 mg Oral Daily   Continuous Infusions:   Antimicrobials: Anti-infectives (From admission, onward)   None       Objective: Vitals: Today's Vitals   12/08/19 1650 12/08/19 1800 12/08/19 1846 12/08/19 2000   BP: (!) 98/48  124/67 130/64  Pulse:   67 65  Resp:  (!) 21 15 18   Temp:    98 F (36.7 C)  TempSrc:    Oral  SpO2:   96% 99%  Weight:      Height:      PainSc:    3     Intake/Output Summary (Last 24 hours) at 12/08/2019 2038 Last data filed at 12/08/2019 1512 Gross per 24 hour  Intake -  Output 1725 ml  Net -1725 ml   Filed Weights   12/03/19 2142 12/04/19 0400  Weight: 125.2 kg 123.6 kg   Weight change:    Intake/Output from previous day: 04/20 0701 - 04/21 0700 In: 3923.8 [P.O.:480; I.V.:3443.8] Out: 1225 [Urine:1225] Intake/Output this shift: No intake/output data recorded.  Examination:  General exam: AAOx3 , NAD,  HEENT:Oral mucosa moist, Ear/Nose WNL grossly,dentition normal. Respiratory system: bilaterally clear,no wheezing or crackles,no use of accessory muscle, non tender. Cardiovascular system: S1 & S2 +, regular, No JVD. Gastrointestinal system: Abdomen soft, NT,ND, BS+. Nervous System:Alert, awake, moving extremities and mild weakness on left leg. Extremities: No edema, distal peripheral pulses  palpable.  Skin: No rashes,no icterus. MSK: Normal muscle bulk,tone, power  Data Reviewed: I have personally reviewed following labs and imaging studies CBC: Recent Labs  Lab 12/03/19 2109 12/03/19 2112 12/05/19 0400 12/06/19 0411 12/08/19 1701  WBC  --  13.8* 10.0 10.1 10.5  NEUTROABS  --  8.2*  --   --  6.2  HGB 12.2* 10.9* 10.0* 9.6* 9.9*  HCT 36.0* 36.3* 33.1* 31.2* 32.4*  MCV  --  89.4 88.7 88.4 88.0  PLT  --  393 350 354 709   Basic Metabolic Panel: Recent Labs  Lab 12/03/19 2109 12/03/19 2112 12/05/19 0400 12/06/19 0411 12/08/19 1701  NA 142 137 143 144 139  K 3.9 3.9 3.3* 3.7 3.7  CL 100 97* 105 104 104  CO2  --  25 30 29 27   GLUCOSE 74 74 78 75 144*  BUN 30* 27* 16 16 13   CREATININE 1.30* 1.44* 1.12 1.29* 1.15  CALCIUM  --  9.1 9.0 9.0 8.7*  MG  --   --  2.1 2.1  --   PHOS  --   --  3.9 4.2  --    GFR: Estimated Creatinine  Clearance: 81.1 mL/min (by C-G formula based on SCr of 1.15 mg/dL). Liver Function Tests: Recent Labs  Lab 12/03/19 2112 12/08/19 1701  AST 41 18  ALT 39 20  ALKPHOS 78 75  BILITOT 0.5 0.7  PROT 7.2 6.6  ALBUMIN 2.8* 2.5*   No results for input(s): LIPASE, AMYLASE in the last 168 hours. No results for input(s): AMMONIA in the last 168 hours. Coagulation Profile: Recent Labs  Lab 12/03/19 2150  INR 1.2   Cardiac Enzymes: No results for input(s): CKTOTAL, CKMB, CKMBINDEX, TROPONINI in the last 168 hours. BNP (last 3 results) No results for input(s): PROBNP in the last 8760 hours. HbA1C: No results for input(s): HGBA1C in the last 72 hours. CBG: Recent Labs  Lab 12/07/19 2221 12/08/19 0559 12/08/19 0625 12/08/19 1118 12/08/19 1637  GLUCAP 89 68* 86 139* 136*   Lipid Profile: No results for input(s): CHOL, HDL, LDLCALC, TRIG, CHOLHDL, LDLDIRECT in the last 72 hours. Thyroid Function Tests: No results for input(s): TSH, T4TOTAL, FREET4, T3FREE, THYROIDAB in the last 72 hours. Anemia Panel: No results for input(s): VITAMINB12, FOLATE, FERRITIN, TIBC, IRON, RETICCTPCT in the last 72 hours. Sepsis Labs: No results for input(s): PROCALCITON, LATICACIDVEN in the last 168 hours.  Recent Results (from the past 240 hour(s))  SARS CORONAVIRUS 2 (TAT 6-24 HRS) Nasopharyngeal Nasopharyngeal Swab     Status: None   Collection Time: 12/04/19  1:00 AM   Specimen: Nasopharyngeal Swab  Result Value Ref Range Status   SARS Coronavirus 2 NEGATIVE NEGATIVE Final    Comment: (NOTE) SARS-CoV-2 target nucleic acids are NOT DETECTED. The SARS-CoV-2 RNA is generally detectable in upper and lower respiratory specimens during the acute phase of infection. Negative results do not preclude SARS-CoV-2 infection, do not rule out co-infections with other pathogens, and should not be used as the sole basis for treatment or other patient management decisions. Negative results must be combined  with clinical observations, patient history, and epidemiological information. The expected result is Negative. Fact Sheet for Patients: SugarRoll.be Fact Sheet for Healthcare Providers: https://www.woods-mathews.com/ This test is not yet approved or cleared by the [place] FDA and  has been authorized for detection and/or diagnosis of SARS-CoV-2 by FDA under an Emergency Use Authorization (EUA). This EUA will remain  in effect (meaning this test can be  used) for the duration of the COVID-19 declaration under Section 56 4(b)(1) of the Act, 21 U.S.C. section 360bbb-3(b)(1), unless the authorization is terminated or revoked sooner. Performed at [facility], [address] [place], [place] [address]       Radiology Studies: No results found.   LOS: 4 days   Time spent: More than 50% of that time was spent in counseling and/or coordination of care.  Lynden Oxford, MD [facility]  12/08/2019, 8:38 PM

## 2019-12-08 NOTE — Progress Notes (Signed)
Pt in bed. BP 124/67.  No complaints

## 2019-12-08 NOTE — Progress Notes (Signed)
BP 98/48; asymptomatic. Denies, dizziness. MD notified.

## 2019-12-08 NOTE — Progress Notes (Signed)
Inpatient Rehabilitation-Admissions Coordinator   Unfortunately I do not have an open bed for this patient in IP Rehab today. Discussed this with pt at the bedside and updated his daughter via phone per his request. [facility] will continue to follow for possible admit, pending bed availability. TOC aware.   Cheri Rous, OTR/L  Rehab Admissions Coordinator  [phone] 12/08/2019 11:32 AM

## 2019-12-08 NOTE — TOC Progression Note (Signed)
Transition of Care [facility]) - Progression Note    Patient Details  Name: Brandon Sanchez MRN: [id number] Date of Birth: 03/23/1946  [facility]) CM/SW Contact  Kermit Balo, RN Phone Number: 12/08/2019, 12:37 PM  Clinical Narrative:    [facility] inpatient rehab unsure of when bed will be available for rehab. CM spoke to patients daughter: Foye Clock and she is agreeable to having the patient faxed out to [facility] in [place]. CM has attempted to reach out to Tamika at [facility] and left message. CM has faxed the information to [phone]. TOC following.   Expected Discharge Plan: IP Rehab Facility Barriers to Discharge: Continued Medical Work up  Expected Discharge Plan and Services Expected Discharge Plan: IP Rehab Facility   Discharge Planning Services: CM Consult Post Acute Care Choice: IP Rehab Living arrangements for the past 2 months: Single Family Home                                       Social Determinants of Health (SDOH) Interventions    Readmission Risk Interventions No flowsheet data found.

## 2019-12-09 ENCOUNTER — Telehealth: Payer: Self-pay | Admitting: Cardiology

## 2019-12-09 LAB — CBC
HCT: 29.7 % — ABNORMAL LOW (ref 39.0–52.0)
Hemoglobin: 9 g/dL — ABNORMAL LOW (ref 13.0–17.0)
MCH: 26.9 pg (ref 26.0–34.0)
MCHC: 30.3 g/dL (ref 30.0–36.0)
MCV: 88.7 fL (ref 80.0–100.0)
Platelets: 284 10*3/uL (ref 150–400)
RBC: 3.35 MIL/uL — ABNORMAL LOW (ref 4.22–5.81)
RDW: 14.9 % (ref 11.5–15.5)
WBC: 9.7 10*3/uL (ref 4.0–10.5)
nRBC: 0 % (ref 0.0–0.2)

## 2019-12-09 LAB — BASIC METABOLIC PANEL
Anion gap: 6 (ref 5–15)
BUN: 11 mg/dL (ref 8–23)
CO2: 29 mmol/L (ref 22–32)
Calcium: 8.8 mg/dL — ABNORMAL LOW (ref 8.9–10.3)
Chloride: 107 mmol/L (ref 98–111)
Creatinine, Ser: 1.17 mg/dL (ref 0.61–1.24)
GFR calc Af Amer: >60 mL/min (ref >60)
GFR calc non Af Amer: >60 mL/min (ref >60)
Glucose, Bld: 95 mg/dL (ref 70–99)
Potassium: 3.7 mmol/L (ref 3.5–5.1)
Sodium: 142 mmol/L (ref 135–145)

## 2019-12-09 LAB — GLUCOSE, CAPILLARY
Glucose-Capillary: 83 mg/dL (ref 70–99)
Glucose-Capillary: 146 mg/dL — ABNORMAL HIGH (ref 70–99)
Glucose-Capillary: 101 mg/dL — ABNORMAL HIGH (ref 70–99)
Glucose-Capillary: 112 mg/dL — ABNORMAL HIGH (ref 70–99)

## 2019-12-09 NOTE — Progress Notes (Signed)
Inpatient Rehabilitation-Admissions Coordinator   I do not have a bed available for this patient today. Will follow up tomorrow for possible admit, pending bed opening.   Cheri Rous, OTR/L  Rehab Admissions Coordinator  [phone] 12/09/2019 11:03 AM

## 2019-12-09 NOTE — Discharge Instructions (Signed)

## 2019-12-09 NOTE — Progress Notes (Addendum)
Occupational Therapy Treatment Patient Details Name: Brandon Sanchez MRN: [id number] DOB: 24-Nov-1945 Today's Date: 12/09/2019    History of present illness 74 year old male with past medical history of diabetes mellitus type 2, paroxysmal atrial fibrillation, hyperlipidemia, hypertension who presents to [facility] emergency department with left-sided weakness and left facial droop.. 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 Patient's session encompassed therapeutic activities to further assess use of LUE. Pt with increased strength and ability to coordinate gross motor movements (no changes in sensations throughout arm) but demonstrates delayed fine motor coordination and ataxic movement when completing fine motor tasks unilaterally or bilaterally requiring rest breaks due to fatigue. Provided education on various ways to incorporate fine motor coordination activities into therapy, with pt in verbal agreement. Due to abilities and PLOF, pt would continue to make an excellent candidate for CIR in order to address higher level deficits to transition home. Will continue to follow acutely.    Follow Up Recommendations  CIR;Supervision - Intermittent    Equipment Recommendations  Other (comment)(to be determined at next venue)    Recommendations for Other Services      Precautions / Restrictions Precautions Precautions: Fall Precaution Comments: L hemiplegia Restrictions Weight Bearing Restrictions: No       Mobility Bed Mobility Overal bed mobility: Needs Assistance Bed Mobility: Supine to Sit     Supine to sit: Min guard     General bed mobility comments: use of rail and HOB elevated; increased time needed   Transfers                 General transfer comment: Sat EOB with therapist to [facility] activities, continues to require use of bed rails  and HOB elevated to come to sitting and supine, noted to require increased effort and time for bed mobility especially when returning to supine (required use of bilateral bed rails in supine to position)    Balance Overall balance assessment: Needs assistance Sitting-balance support: Feet supported Sitting balance-Leahy Scale: Good Sitting balance - Comments: supervision at EOB                                   ADL either performed or assessed with clinical judgement   ADL Overall ADL's : Needs assistance/impaired                     Lower Body Dressing: Sitting/lateral leans;Min guard Lower Body Dressing Details (indicate cue type and reason): pt able to don sock on L with increased time using figure 4 position, able to demonstrate figure 4 on R as well             Functional mobility during ADLs: Minimal assistance General ADL Comments: min A to complete LB dressing, session focusing on fine motor coordination of LUE     Vision Baseline Vision/History: Wears glasses Wears Glasses: At all times Patient Visual Report: No change from baseline     Perception     Praxis      Cognition Arousal/Alertness: Awake/alert Behavior During Therapy: WFL for tasks assessed/performed Overall Cognitive Status: Within Functional Limits for tasks assessed  Exercises Hand Activities Pick Up, Palm, Put Down: Both;20 reps;Seated Turning Pages: Left;10 reps   Shoulder Instructions       General Comments      Pertinent Vitals/ Pain       Pain Assessment: No/denies pain Faces Pain Scale: No hurt  Home Living                                          Prior Functioning/Environment              Frequency  Min 2X/week        Progress Toward Goals  OT Goals(current goals can now be found in the care plan section)  Progress towards OT goals: Progressing toward goals  Acute  Rehab OT Goals Patient Stated Goal: to get stronger OT Goal Formulation: With patient Time For Goal Achievement: 12/18/19 Potential to Achieve Goals: Good  Plan Discharge plan needs to be updated    Co-evaluation                 AM-PAC OT "6 Clicks" Daily Activity     Outcome Measure   Help from another person eating meals?: A Little Help from another person taking care of personal grooming?: A Little Help from another person toileting, which includes using toliet, bedpan, or urinal?: A Little Help from another person bathing (including washing, rinsing, drying)?: A Little Help from another person to put on and taking off regular upper body clothing?: A Little Help from another person to put on and taking off regular lower body clothing?: A Little 6 Click Score: 18    End of Session    OT Visit Diagnosis: Unsteadiness on feet (R26.81);Other abnormalities of gait and mobility (R26.89);Muscle weakness (generalized) (M62.81);Other symptoms and signs involving cognitive function;Hemiplegia and hemiparesis;Pain Hemiplegia - Right/Left: Left Hemiplegia - dominant/non-dominant: Non-Dominant Hemiplegia - caused by: Cerebral infarction   Activity Tolerance Patient tolerated treatment well   Patient Left in bed;with call bell/phone within reach   Nurse Communication Mobility status        Time: 1610-9604 OT Time Calculation (min): 24 min  Charges: OT General Charges $OT Visit: 1 Visit OT Treatments $Therapeutic Activity: 23-37 mins  [place]. Makana Rostad, COTA/L Acute Rehabilitation Services [place] 12/09/2019, 2:27 PM

## 2019-12-09 NOTE — Telephone Encounter (Signed)
New Message:  Daughter called and said pt is in [facility] Pt have had a stroke. Pt is going to In Patient rehab tomorrow for 2 to 3 weeks. Pt is scheduled for an Ablation on 01-14-20. She wants to know if pt will still have his Ablation, since he have a stroke?

## 2019-12-09 NOTE — H&P (Addendum)
Physical Medicine and Rehabilitation Admission H&P    Chief Complaint  Patient presents with  . Stroke with functional deficits.     HPI: Brandon Sanchez. Corpuz is a 74 year old RH male with history of T2DM, HTN, cataracts--blurry vision/needs surgery, alcohol abuse--quit 1/21, recent diagnosis of A. Fib--on Eliquis who was admitted on 12/03/2019 with left hemiparesis, facial droop and dysarthria.  History taken from chart review, patient, and attending physician.  CT head unremarkable for acute intracranial process.  CTA head/neck was negative for LVO, atherosclerotic changes distal CCA and carotid bifurcation, 50% stenosis left-VA and showed degenerative changes of cervical spine.  MRI brain personally reviewed, showing small right brain infarct.  Per report, MRI/MRI brain, acute right basal ganglia/CR infarct.  Echocardiogram with ejection fraction of 55 to 60% with moderate aortic stenosis and mild concentric LVH with grade 3 DD.  Dr. Roda Shutters felt the stroke was due to large vessel disease-- likely right CCA mixed plaque and low-dose ASA added to Eliquis.  Lipitor increased to 80 mg.  Patient has had fluctuating left-sided symptoms due to hypotension therefore metoprolol and Lasix have been discontinued.  He has had some increasing bipedal edema with reports of pain and numbness.  Discussed patient's recent complaint of toe pain and discharge with attending physician.  Patient continues to be limited by left-sided weakness affecting ADLs and mobility.  CIR recommended for follow-up therapy.  Please see preadmission assessment from earlier today as well.  Review of Systems  Constitutional: Negative for fever.  HENT: Negative for hearing loss and tinnitus.   Eyes: Positive for blurred vision (due to cataracts).  Respiratory: Negative for cough and shortness of breath.   Cardiovascular: Positive for leg swelling (acute on chronic). Negative for chest pain.  Gastrointestinal: Negative for abdominal pain,  constipation, heartburn and nausea.  Genitourinary: Positive for frequency and urgency.       Overactive bladder--gets up at least twice a night. Has seen GU  Musculoskeletal: Positive for joint pain and myalgias. Negative for back pain and neck pain.  Neurological: Positive for sensory change (numbness /pain bilateral feet) and focal weakness.  Psychiatric/Behavioral: The patient does not have insomnia.     Past Medical History:  Diagnosis Date  . Allergic rhinitis, cause unspecified   . Asthma   . Cataract cortical, senile, bilateral   . Macular degeneration of left eye   . Other and unspecified hyperlipidemia   . Type II or unspecified type diabetes mellitus without mention of complication, not stated as uncontrolled   . Unspecified essential hypertension     Past Surgical History:  Procedure Laterality Date  . CARDIOVERSION N/A 09/22/2019   Procedure: CARDIOVERSION;  Surgeon: [name], MD;  Location: [facility] ENDOSCOPY;  Service: Endoscopy;  Laterality: N/A;  . CARDIOVERSION N/A 10/18/2019   Procedure: CARDIOVERSION;  Surgeon: Jake Bathe, MD;  Location: [facility] ENDOSCOPY;  Service: Cardiovascular;  Laterality: N/A;  . TEE WITHOUT CARDIOVERSION N/A 09/22/2019   Procedure: TRANSESOPHAGEAL ECHOCARDIOGRAM (TEE);  Surgeon: [name], MD;  Location: [facility] ENDOSCOPY;  Service: Endoscopy;  Laterality: N/A;    Family History  Problem Relation Age of Onset  . Colon cancer Father   . Heart disease Paternal Grandfather     Social History:  Lives alone. Independent PTA. Retired Clinical research associate. He reports that he has never smoked. He has never used smokeless tobacco. He reports alcohol use--drinks 2 liter bottle of wine/night--quit in Jan.  He reports that he does not use drugs.  Allergies: No Known Allergies    Medications Prior to Admission  Medication Sig Dispense Refill  . acetaminophen (TYLENOL) 500 MG tablet Take 1,000 mg by mouth every 6 (six) hours as needed for moderate  pain or headache.     . albuterol (PROAIR HFA) 108 (90 BASE) MCG/ACT inhaler 2 puffs every 4 hours as needed only  if your can't catch your breath (Patient taking differently: Inhale 2 puffs into the lungs every 4 (four) hours as needed for wheezing. ) 1 Inhaler 1  . amiodarone (PACERONE) 200 MG tablet Take 1 tablet (200 mg total) by mouth daily. 90 tablet 2  . apixaban (ELIQUIS) 5 MG TABS tablet Take 1 tablet (5 mg total) by mouth 2 (two) times daily. 60 tablet 6  . [START ON 12/11/2019] aspirin EC 81 MG EC tablet Take 1 tablet (81 mg total) by mouth daily. 60 tablet 0  . atorvastatin (LIPITOR) 40 MG tablet Take 40 mg by mouth every evening.     Marland Kitchen BREO ELLIPTA 200-25 MCG/INH AEPB Inhale 1 puff into the lungs daily as needed (shortness of breath).     . furosemide (LASIX) 40 MG tablet Take 1 tablet (40 mg total) by mouth daily. May take additional 40 mg if notice swelling of shortness of breath 30 tablet 0  . Glucosamine-Chondroit-Vit C-Mn (GLUCOSAMINE CHONDR 1500 COMPLX) CAPS Take 1 capsule by mouth 2 (two) times daily.    . insulin aspart protamine- aspart (NOVOLOG MIX 70/30) (70-30) 100 UNIT/ML injection Inject 0.16 mLs (16 Units total) into the skin 2 (two) times daily at 8 am and 10 pm. Dose varies - Prescription is for 20 units with each meal (twice daily). However administration depends on what types of meals being taken. 10 mL 0  . Multiple Vitamin (MULTIVITAMIN WITH MINERALS) TABS tablet Take 1 tablet by mouth daily.    Marland Kitchen omeprazole (PRILOSEC) 40 MG capsule Take 40 mg by mouth daily as needed (heart burn).     Glory Rosebush VERIO test strip 1 each by Other route in the morning, at noon, and at bedtime.     Derrill Memo ON 12/11/2019] polyethylene glycol (MIRALAX / GLYCOLAX) 17 g packet Take 17 g by mouth daily. 14 each 0  . potassium chloride (KLOR-CON) 10 MEQ tablet Take 10 mEq by mouth at bedtime.    Marland Kitchen PREVIDENT 5000 BOOSTER PLUS 1.1 % PSTE Place 1 application onto teeth in the morning and at  bedtime.     . sertraline (ZOLOFT) 100 MG tablet Take 100 mg by mouth daily.      Drug Regimen Review  Drug regimen was reviewed and remains appropriate with no significant issues identified  Home: Home Living Family/patient expects to be discharged to:: Private residence Living Arrangements: Alone Available Help at Discharge: Family, Other (Comment) Type of Home: House Home Access: Stairs to enter [employer] of Steps: 10 from parking spot to door Entrance Stairs-Rails: Left Home Layout: Two level, Bed/bath upstairs, 1/2 bath on main level Alternate Level Stairs-Number of Steps: 17 with landing Alternate Level Stairs-Rails: Right Bathroom Shower/Tub: Tub/shower unit, Multimedia programmer: Handicapped height Bathroom Accessibility: Yes Home Equipment: [place] - single point   Functional History: Prior Function Level of Independence: Needs assistance Gait / Transfers Assistance Needed: pt navigating stairs with slow step-to gait pattern with L foot leading with use of rails. reports no use of AD at home, use of driver and then [facility] for Dr visits ADL's / Homemaking Assistance Needed: pt reports home  care service that does general home maintenance and cleaning, pt reports he does his own cooking adn ADL  Functional Status:  Mobility: Bed Mobility Overal bed mobility: Modified Independent Bed Mobility: Supine to Sit Rolling: Min assist Sidelying to sit: Min guard Supine to sit: Min guard Sit to supine: Max assist General bed mobility comments: increased time and use of rail Transfers Overall transfer level: Needs assistance Equipment used: 1 person hand held assist Transfer via Lift Equipment: Stedy Transfers: Sit to/from Stand Sit to Stand: Min assist Squat pivot transfers: Total assist  Lateral/Scoot Transfers: Mod assist General transfer comment: assist to power up into standing and then to gain balance in standing without use of AD; HHA upon standing    Ambulation/Gait Ambulation/Gait assistance: Min assist, Mod assist Gait Distance (Feet): 100 Feet Assistive device: 1 person hand held assist(assist at trunk with gait belt) Gait Pattern/deviations: Decreased stride length, Step-through pattern, Decreased dorsiflexion - left General Gait Details: assist for balance; decreased bilat step lengths and very guarded movement Gait velocity: decreased    ADL: ADL Overall ADL's : Needs assistance/impaired Eating/Feeding: Minimal assistance Grooming: Minimal assistance, Bed level Upper Body Bathing: Moderate assistance, Bed level Lower Body Bathing: Maximal assistance, Bed level Upper Body Dressing : Maximal assistance, Sitting Lower Body Dressing: Sitting/lateral leans, Min guard Lower Body Dressing Details (indicate cue type and reason): pt able to don sock on L with increased time using figure 4 position, able to demonstrate figure 4 on R as well Toilet Transfer: (will need lift equipment) Toileting- Clothing Manipulation and Hygiene: Total assistance Toileting - Clothing Manipulation Details (indicate cue type and reason): declined to try male urinal; only wanting condom cath Functional mobility during ADLs: Minimal assistance General ADL Comments: min A to complete LB dressing, session focusing on fine motor coordination of LUE  Cognition: Cognition Overall Cognitive Status: No family/caregiver present to determine baseline cognitive functioning Arousal/Alertness: Awake/alert Orientation Level: Oriented X4 Attention: Focused, Sustained Focused Attention: Appears intact Sustained Attention: Appears intact Memory: Appears intact(Immediate: 5/5; delayed: 5/5) Awareness: Appears intact Problem Solving: Appears intact Executive Function: Reasoning, Sequencing Reasoning: (Abstract: 2/2) Sequencing: Appears intact Cognition Arousal/Alertness: Awake/alert Behavior During Therapy: WFL for tasks assessed/performed Overall Cognitive  Status: No family/caregiver present to determine baseline cognitive functioning Area of Impairment: Problem solving Current Attention Level: Selective Safety/Judgement: Decreased awareness of safety, Decreased awareness of deficits Awareness: Emergent Problem Solving: Slow processing, Difficulty sequencing General Comments: Pt with improved engagement and motivation today   Blood pressure 125/63, pulse 69, temperature 98.7 F (37.1 C), temperature source Oral, resp. rate 20, height 6\' 2"  (1.88 m), weight 123.6 kg, SpO2 94 %. Physical Exam  Nursing note and vitals reviewed. Constitutional: He is oriented to person, place, and time. He appears well-developed.  Obese  HENT:  Head: Normocephalic and atraumatic.  Eyes: EOM are normal. Right eye exhibits no discharge. Left eye exhibits no discharge.  Neck: No tracheal deviation present. No thyromegaly present.  Cardiovascular: An irregular rhythm present.  Respiratory: Effort normal. No respiratory distress.  GI: Soft. He exhibits no distension. There is no abdominal tenderness.  Musculoskeletal:        General: Edema present.     Comments: Lower extremity edema  Neurological: He is alert and oriented to person, place, and time.  Motor: Right upper extremity/right lower extremity: 5/5 proximal distal Left upper extremity: 4+/5 proximal and distal Left lower extremity: 4+/5 proximal distal Left upper extremity with mild ataxia  Skin:  Scattered bruising  Psychiatric: He has a  normal mood and affect. His behavior is normal. Thought content normal.    Results for orders placed or performed during the hospital encounter of 12/03/19 (from the past 48 hour(s))  Glucose, capillary     Status: Abnormal   Collection Time: 12/08/19  4:37 PM  Result Value Ref Range   Glucose-Capillary 136 (H) 70 - 99 mg/dL    Comment: Glucose reference range applies only to samples taken after fasting for at least 8 hours.   Comment 1 Notify RN    Comment 2  Document in Chart   CBC with Differential/Platelet     Status: Abnormal   Collection Time: 12/08/19  5:01 PM  Result Value Ref Range   WBC 10.5 4.0 - 10.5 K/uL   RBC 3.68 (L) 4.22 - 5.81 MIL/uL   Hemoglobin 9.9 (L) 13.0 - 17.0 g/dL   HCT 42.7 (L) 06.2 - 37.6 %   MCV 88.0 80.0 - 100.0 fL   MCH 26.9 26.0 - 34.0 pg   MCHC 30.6 30.0 - 36.0 g/dL   RDW 28.3 15.1 - 76.1 %   Platelets 347 150 - 400 K/uL   nRBC 0.0 0.0 - 0.2 %   Neutrophils Relative % 59 %   Neutro Abs 6.2 1.7 - 7.7 K/uL   Lymphocytes Relative 30 %   Lymphs Abs 3.1 0.7 - 4.0 K/uL   Monocytes Relative 7 %   Monocytes Absolute 0.8 0.1 - 1.0 K/uL   Eosinophils Relative 3 %   Eosinophils Absolute 0.3 0.0 - 0.5 K/uL   Basophils Relative 0 %   Basophils Absolute 0.0 0.0 - 0.1 K/uL   Immature Granulocytes 1 %   Abs Immature Granulocytes 0.06 0.00 - 0.07 K/uL    Comment: Performed at [facility], [address], [place], [place] [address]  Comprehensive metabolic panel     Status: Abnormal   Collection Time: 12/08/19  5:01 PM  Result Value Ref Range   Sodium 139 135 - 145 mmol/L   Potassium 3.7 3.5 - 5.1 mmol/L   Chloride 104 98 - 111 mmol/L   CO2 27 22 - 32 mmol/L   Glucose, Bld 144 (H) 70 - 99 mg/dL    Comment: Glucose reference range applies only to samples taken after fasting for at least 8 hours.   BUN 13 8 - 23 mg/dL   Creatinine, Ser 1.06 0.61 - 1.24 mg/dL   Calcium 8.7 (L) 8.9 - 10.3 mg/dL   Total Protein 6.6 6.5 - 8.1 g/dL   Albumin 2.5 (L) 3.5 - 5.0 g/dL   AST 18 15 - 41 U/L   ALT 20 0 - 44 U/L   Alkaline Phosphatase 75 38 - 126 U/L   Total Bilirubin 0.7 0.3 - 1.2 mg/dL   GFR calc non Af Amer >60 >60 mL/min   GFR calc Af Amer >60 >60 mL/min   Anion gap 8 5 - 15    Comment: Performed at [facility], [address], [place], [place] [address]  Glucose, capillary     Status: None   Collection Time: 12/08/19  9:14 PM  Result Value Ref Range   Glucose-Capillary 76 70 - 99 mg/dL    Comment:  Glucose reference range applies only to samples taken after fasting for at least 8 hours.  Basic metabolic panel     Status: Abnormal   Collection Time: 12/09/19  4:59 AM  Result Value Ref Range   Sodium 142 135 - 145 mmol/L  Potassium 3.7 3.5 - 5.1 mmol/L   Chloride 107 98 - 111 mmol/L   CO2 29 22 - 32 mmol/L   Glucose, Bld 95 70 - 99 mg/dL    Comment: Glucose reference range applies only to samples taken after fasting for at least 8 hours.   BUN 11 8 - 23 mg/dL   Creatinine, Ser 1.17 0.61 - 1.24 mg/dL   Calcium 8.8 (L) 8.9 - 10.3 mg/dL   GFR calc non Af Amer >60 >60 mL/min   GFR calc Af Amer >60 >60 mL/min   Anion gap 6 5 - 15    Comment: Performed at [facility] [address], [place], [place] [address]  CBC     Status: Abnormal   Collection Time: 12/09/19  4:59 AM  Result Value Ref Range   WBC 9.7 4.0 - 10.5 K/uL   RBC 3.35 (L) 4.22 - 5.81 MIL/uL   Hemoglobin 9.0 (L) 13.0 - 17.0 g/dL   HCT 29.7 (L) 39.0 - 52.0 %   MCV 88.7 80.0 - 100.0 fL   MCH 26.9 26.0 - 34.0 pg   MCHC 30.3 30.0 - 36.0 g/dL   RDW 14.9 11.5 - 15.5 %   Platelets 284 150 - 400 K/uL   nRBC 0.0 0.0 - 0.2 %    Comment: Performed at [facility], [place] [address] [place], [place] [address]  Glucose, capillary     Status: None   Collection Time: 12/09/19  6:05 AM  Result Value Ref Range   Glucose-Capillary 83 70 - 99 mg/dL    Comment: Glucose reference range applies only to samples taken after fasting for at least 8 hours.  Glucose, capillary     Status: Abnormal   Collection Time: 12/09/19 10:45 AM  Result Value Ref Range   Glucose-Capillary 146 (H) 70 - 99 mg/dL    Comment: Glucose reference range applies only to samples taken after fasting for at least 8 hours.   Comment 1 Notify RN    Comment 2 Document in Chart   Glucose, capillary     Status: Abnormal   Collection Time: 12/09/19  4:26 PM  Result Value Ref Range   Glucose-Capillary 101 (H) 70 - 99 mg/dL    Comment: Glucose reference  range applies only to samples taken after fasting for at least 8 hours.   Comment 1 Notify RN    Comment 2 Document in Chart   Glucose, capillary     Status: Abnormal   Collection Time: 12/09/19  8:39 PM  Result Value Ref Range   Glucose-Capillary 112 (H) 70 - 99 mg/dL    Comment: Glucose reference range applies only to samples taken after fasting for at least 8 hours.  Basic metabolic panel     Status: None   Collection Time: 12/10/19  4:33 AM  Result Value Ref Range   Sodium 143 135 - 145 mmol/L   Potassium 3.8 3.5 - 5.1 mmol/L   Chloride 106 98 - 111 mmol/L   CO2 28 22 - 32 mmol/L   Glucose, Bld 77 70 - 99 mg/dL    Comment: Glucose reference range applies only to samples taken after fasting for at least 8 hours.   BUN 11 8 - 23 mg/dL   Creatinine, Ser 0.96 0.61 - 1.24 mg/dL   Calcium 9.0 8.9 - 10.3 mg/dL   GFR calc non Af Amer >60 >60 mL/min   GFR calc Af Amer >60 >60 mL/min   Anion  gap 9 5 - 15    Comment: Performed at [facility], [address] [place], [place] [address]  CBC     Status: Abnormal   Collection Time: 12/10/19  4:33 AM  Result Value Ref Range   WBC 8.8 4.0 - 10.5 K/uL   RBC 3.45 (L) 4.22 - 5.81 MIL/uL   Hemoglobin 9.1 (L) 13.0 - 17.0 g/dL   HCT 96.7 (L) 59.1 - 63.8 %   MCV 89.6 80.0 - 100.0 fL   MCH 26.4 26.0 - 34.0 pg   MCHC 29.4 (L) 30.0 - 36.0 g/dL   RDW 46.6 59.9 - 35.7 %   Platelets 293 150 - 400 K/uL   nRBC 0.0 0.0 - 0.2 %    Comment: Performed at [facility], [address], [place], [place] [address]  Glucose, capillary     Status: None   Collection Time: 12/10/19  6:26 AM  Result Value Ref Range   Glucose-Capillary 84 70 - 99 mg/dL    Comment: Glucose reference range applies only to samples taken after fasting for at least 8 hours.  Glucose, capillary     Status: Abnormal   Collection Time: 12/10/19 11:41 AM  Result Value Ref Range   Glucose-Capillary 109 (H) 70 - 99 mg/dL    Comment: Glucose reference range applies only to  samples taken after fasting for at least 8 hours.   Comment 1 Notify RN    Comment 2 Document in Chart    No results found.     Medical Problem List and Plan: 1.  Left hemiparesis, deficits with endurance, and self-care secondary to acute right basal ganglia/CR infarct.  -patient may shower  -ELOS/Goals: 8-12 days/supervision/mod I  Admit to CIR 2.  Antithrombotics: -DVT/anticoagulation:  Pharmaceutical: Other (comment)Eliquis   -antiplatelet therapy: Low dose ASA 3. Pain Management: Tylenol prn effective for total pain.   Monitor with increased ambulation 4. Mood: LCSW to follow for evaluation and support.   -antipsychotic agents: NA 5. Neuropsych: This patient is capable of making decisions on his own behalf. 6. Skin/Wound Care: Routine pressure relief measures.  7. Fluids/Electrolytes/Nutrition: Monitor I/Os.  CMP ordered. 8. HTN: Monitor BP tid--continue to hold lasix and metoprolol for now.   Monitor with increased mobility. 9. T2DM with hyperglycemia: Intake has been good--will continue to monitor BS ac/hs. Continue to titrate 70/30 insulin--used 20 units bid depending on BS (per Dr. Talmage Nap)  Monitor with increased mobility. 10. PAF: Monitor heart rate tid--continue amiodarone. Off Toprol at this time.   Monitor with increased exertion 11. H/o depression: Mood stable on Zoloft. 12. Constipation: Continue senna S bid.   Adjust bowel meds as necessary. 13. GERD: Continue PPI.  14. H/o asthma: Resume Breo.   Jacquelynn Cree, PA-C 12/10/2019  I have personally performed a face to face diagnostic evaluation, including, but not limited to relevant history and physical exam findings, of this patient and developed relevant assessment and plan.  Additionally, I have reviewed and concur with the physician assistant's documentation above.  Maryla Morrow, MD, ABPMR

## 2019-12-09 NOTE — Progress Notes (Signed)
PROGRESS NOTE    Brandon Sanchez  KPV:[id number] DOB: [birth year] DOA: 12/03/2019 PCP: Merri Brunette, MD   Brief Narrative: 74 year old male with past medical history of diabetes mellitus type 2, paroxysmal atrial fibrillation, hyperlipidemia, hypertension who presents to [facility] emergency department with left-sided weakness and left facial droop.  Patient was brought to the ER, code stroke on arrival, seen by neurology CT head and CT angiogram head and neck done and due to timeline of symptoms and patient's already being on Eliquis TPA was not administered and patient was admitted for further management. 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  Is able to move his leg and improving, seen by neurology.  Continues to get PT OT and plan is for CIR when bed available.  Subjective:  No acute complaint no acute events.  No nausea no vomiting.  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  Patient completed stroke work-up with MRI-right BG/CR is small infarcts, MRA head normal, no emergent large vessel occlusion on CTA head and neck- see findings below.  With EF 55 to 60% 2, hemoglobin A1c at 7.6.  Uncontrolled type 2 diabetes mellitus with hypoglycemia without coma, with long-term current use of insulin: Hemoglobin A1c 7.6, blood sugar controlled on home regimen of NovoLog.  Was  hypoglycemic on arrival needing dextrose. Recent Labs  Lab 12/08/19 1637 12/08/19 2114 12/09/19 0605 12/09/19 1045 12/09/19 1626  GLUCAP 136* 76 83 146* 101*   Left foot spasm will place on Robaxin 3 times daily x2 days.  Essential hypertension: BP well controlled on Lasix.  Metoprolol on hold-and discontinued due to hypotension and fluctuating neuro symptoms, Lasix dose has been cut  down to 40.  Avoid low blood pressure.  Paroxysmal atrial fibrillation: Continue Eliquis, amiodarone.  Rate is controlled and in sinus rhythm.  Hyperlipidemia on Lipitor.  Morbid obesity with BMI 34: He will benefit with weight loss and healthy lifestyle.  Mildly elevated creatinine 1.29.  Monitor intermittently.  Anemia likely from chronic disease.  Hemoglobin is stable.  DVT prophylaxis: eliquis Code Status:full Family Communication: plan of care discussed with patient at bedside. Status is: Inpatient  Remains inpatient appropriate because:Inpatient level of care appropriate due to severity of illness  Dispo: The patient is from: Home              Anticipated d/c is to: CIR awaiting placement.              Anticipated d/c date is: 1 day              Patient currently is medically stable to d/c. Nutrition: Diet Order            Diet heart healthy/carb modified Room service appropriate? Yes; Fluid consistency: Thin  Diet effective now             Body mass index is 34.99 kg/m.  Consultants:see note  Procedures:  CT Code Stroke CTA Head W/WO contrast CT Code Stroke CTA Neck W/WO contrast 12/03/2019 IMPRESSION:  1. No emergent large vessel occlusion  2. Atherosclerotic changes in the distal common carotid arteries and at the carotid bifurcations bilaterally significant stenosis.  3. Aortic Atherosclerosis (ICD10-I70.0).  4. Degenerative changes of the cervical spine including fusion at C3-4 and C6-7.  CT HEAD CODE STROKE WO CONTRAST 12/03/2019  IMPRESSION:  1. No acute intracranial abnormality.  2. Mild generalized atrophy and white matter disease. This likely reflects the sequela of chronic microvascular ischemia.  3. ASPECTS is 10/10.  4. Mild sinus disease.   MRI / MRA Head 1. 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 2. Mild chronic small vessel ischemic disease. 3. Negative head MRA.  DG Chest Port 1  View 12/04/2019 IMPRESSION: Left basilar veiling opacity consistent with consolidation and/or Effusion.   Transthoracic Echocardiogram  1. Left ventricular ejection fraction, by estimation, is 55 to 60%. The  left ventricle has normal function. The left ventricle has no regional  wall motion abnormalities. The left ventricular internal cavity size was  mildly dilated. There is mild  concentric left ventricular hypertrophy. Left ventricular diastolic  parameters are consistent with Grade III diastolic dysfunction  (restrictive). Elevated left ventricular end-diastolic pressure.  2. Right ventricular systolic function is normal. The right ventricular  size is mildly enlarged.  3. Left atrial size was mildly dilated.  4. Right atrial size was mild to moderately dilated.  5. The mitral valve is grossly normal. Trivial mitral valve  regurgitation. No evidence of mitral stenosis.  6. The aortic valve is tricuspid. Aortic valve regurgitation is mild to  moderate. Moderate aortic valve stenosis.  7. The inferior vena cava is dilated in size with >50% respiratory  variability, suggesting right atrial pressure of 8 mmHg.    Microbiology:see note  Medications: Scheduled Meds: . amiodarone  200 mg Oral Daily  . apixaban  5 mg Oral BID  . aspirin EC  81 mg Oral Daily  . atorvastatin  80 mg Oral Daily  . insulin aspart  0-15 Units Subcutaneous TID AC & HS  . insulin aspart protamine- aspart  16 Units Subcutaneous BID WC  . pantoprazole  40 mg Oral Daily  . polyethylene glycol  17 g Oral Daily  . senna-docusate  1 tablet Oral BID  . sertraline  100 mg Oral Daily   Continuous Infusions:   Antimicrobials: Anti-infectives (From admission, onward)   None       Objective: Vitals: Today's Vitals   12/09/19 1255 12/09/19 1623 12/09/19 1639 12/09/19 1710  BP: (!) 124/54 132/69    Pulse: 70 71    Resp: 19 20    Temp: 97.9 F (36.6 C) 98.1 F (36.7 C)    TempSrc: Oral Oral     SpO2: 96% 95%    Weight:      Height:      PainSc:   4  0-No pain    Intake/Output Summary (Last 24 hours) at 12/09/2019 1936 Last data filed at 12/09/2019 0500 Gross per 24 hour  Intake --  Output 1100 ml  Net -1100 ml   Filed Weights   12/03/19 2142 12/04/19 0400  Weight: 125.2 kg 123.6 kg   Weight change:    Intake/Output from previous day: 04/21 0701 - 04/22 0700 In: -  Out: 2000 [Urine:2000] Intake/Output this shift: No intake/output data recorded.  Examination:  General exam: AAOx3 , NAD,  HEENT:Oral mucosa moist, Ear/Nose WNL grossly,dentition normal. Respiratory system: bilaterally clear,no wheezing or crackles,no use of accessory muscle, non tender. Cardiovascular system: S1 & S2 +, regular, No JVD. Gastrointestinal system: Abdomen soft, NT,ND, BS+. Nervous System:Alert, awake, moving extremities and mild weakness on left leg. Extremities: No edema, distal peripheral pulses palpable.  Skin: No rashes,no icterus. MSK: Normal muscle bulk,tone, power  Data Reviewed: I have personally reviewed following labs and imaging studies CBC:  Recent Labs  Lab 12/03/19 2112 12/05/19 0400 12/06/19 0411 12/08/19 1701 12/09/19 0459  WBC 13.8* 10.0 10.1 10.5 9.7  NEUTROABS 8.2*  --   --  6.2  --   HGB 10.9* 10.0* 9.6* 9.9* 9.0*  HCT 36.3* 33.1* 31.2* 32.4* 29.7*  MCV 89.4 88.7 88.4 88.0 88.7  PLT 393 350 354 347 284   Basic Metabolic Panel: Recent Labs  Lab 12/03/19 2112 12/05/19 0400 12/06/19 0411 12/08/19 1701 12/09/19 0459  NA 137 143 144 139 142  K 3.9 3.3* 3.7 3.7 3.7  CL 97* 105 104 104 107  CO2 25 30 29 27 29   GLUCOSE 74 78 75 144* 95  BUN 27* 16 16 13 11   CREATININE 1.44* 1.12 1.29* 1.15 1.17  CALCIUM 9.1 9.0 9.0 8.7* 8.8*  MG  --  2.1 2.1  --   --   PHOS  --  3.9 4.2  --   --    GFR: Estimated Creatinine Clearance: 79.8 mL/min (by C-G formula based on SCr of 1.17 mg/dL). Liver Function Tests: Recent Labs  Lab 12/03/19 2112 12/08/19 1701   AST 41 18  ALT 39 20  ALKPHOS 78 75  BILITOT 0.5 0.7  PROT 7.2 6.6  ALBUMIN 2.8* 2.5*   No results for input(s): LIPASE, AMYLASE in the last 168 hours. No results for input(s): AMMONIA in the last 168 hours. Coagulation Profile: Recent Labs  Lab 12/03/19 2150  INR 1.2   Cardiac Enzymes: No results for input(s): CKTOTAL, CKMB, CKMBINDEX, TROPONINI in the last 168 hours. BNP (last 3 results) No results for input(s): PROBNP in the last 8760 hours. HbA1C: No results for input(s): HGBA1C in the last 72 hours. CBG: Recent Labs  Lab 12/08/19 1637 12/08/19 2114 12/09/19 0605 12/09/19 1045 12/09/19 1626  GLUCAP 136* 76 83 146* 101*   Lipid Profile: No results for input(s): CHOL, HDL, LDLCALC, TRIG, CHOLHDL, LDLDIRECT in the last 72 hours. Thyroid Function Tests: No results for input(s): TSH, T4TOTAL, FREET4, T3FREE, THYROIDAB in the last 72 hours. Anemia Panel: No results for input(s): VITAMINB12, FOLATE, FERRITIN, TIBC, IRON, RETICCTPCT in the last 72 hours. Sepsis Labs: No results for input(s): PROCALCITON, LATICACIDVEN in the last 168 hours.  Recent Results (from the past 240 hour(s))  SARS CORONAVIRUS 2 (TAT 6-24 HRS) Nasopharyngeal Nasopharyngeal Swab     Status: None   Collection Time: 12/04/19  1:00 AM   Specimen: Nasopharyngeal Swab  Result Value Ref Range Status   SARS Coronavirus 2 NEGATIVE NEGATIVE Final    Comment: (NOTE) SARS-CoV-2 target nucleic acids are NOT DETECTED. The SARS-CoV-2 RNA is generally detectable in upper and lower respiratory specimens during the acute phase of infection. Negative results do not preclude SARS-CoV-2 infection, do not rule out co-infections with other pathogens, and should not be used as the sole basis for treatment or other patient management decisions. Negative results must be combined with clinical observations, patient history, and epidemiological information. The expected result is Negative. Fact Sheet for  Patients: 12/11/19 Fact Sheet for Healthcare Providers: 12/06/19 This test is not yet approved or cleared by the [employer] FDA and  has been authorized for detection and/or diagnosis of SARS-CoV-2 by FDA under an Emergency Use Authorization (EUA). This EUA will remain  in effect (meaning this test can be used) for the duration of the COVID-19 declaration under Section 56 4(b)(1) of the Act, 21 U.S.C. section 360bbb-3(b)(1), unless the authorization is terminated or revoked sooner. Performed at [facility], [address] [place], [place] [address]       Radiology Studies: No results found.   LOS: 5 days   Time spent: More than 50% of that time was spent in counseling and/or coordination of care.  Lynden Oxford, MD [facility]  12/09/2019, 7:36 PM

## 2019-12-09 NOTE — Progress Notes (Signed)
Physical Therapy Treatment Patient Details Name: Brandon Sanchez MRN: [id number] DOB: Jan 13, 1946 Today's Date: 12/09/2019    History of Present Illness 74 year old male with past medical history of diabetes mellitus type 2, paroxysmal atrial fibrillation, hyperlipidemia, hypertension who presents to [facility] emergency department with left-sided weakness and left facial droop.. 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 This session focused on gait training without use of AD as pt was independent without AD prior to admission. Pt requires min-mod A. Continue to recommend CIR for further skilled PT services to maximize independence and safety with mobility.     Follow Up Recommendations  CIR;Supervision/Assistance - 24 hour     Equipment Recommendations  Rolling walker with 5" wheels;Other (comment)(to be further assessed post acute)    Recommendations for Other Services       Precautions / Restrictions Precautions Precautions: Fall Precaution Comments: L hemiplegia Restrictions Weight Bearing Restrictions: No    Mobility  Bed Mobility Overal bed mobility: Modified Independent Bed Mobility: Supine to Sit     Supine to sit: Min guard     General bed mobility comments: increased time and use of rail  Transfers Overall transfer level: Needs assistance Equipment used: 1 person hand held assist Transfers: Sit to/from Stand Sit to Stand: Min assist         General transfer comment: assist to power up into standing and then to gain balance in standing without use of AD; HHA upon standing   Ambulation/Gait Ambulation/Gait assistance: Min assist;Mod assist Gait Distance (Feet): 100 Feet Assistive device: 1 person hand held assist(assist at trunk with gait belt) Gait Pattern/deviations: Decreased stride length;Step-through pattern;Decreased dorsiflexion - left Gait velocity:  decreased   General Gait Details: assist for balance; decreased bilat step lengths and very guarded movement   Stairs             Wheelchair Mobility    Modified Rankin (Stroke Patients Only) Modified Rankin (Stroke Patients Only) Pre-Morbid Rankin Score: Moderate disability Modified Rankin: Moderately severe disability     Balance Overall balance assessment: Needs assistance Sitting-balance support: Feet supported Sitting balance-Leahy Scale: Good Sitting balance - Comments: supervision at EOB   Standing balance support: During functional activity;Single extremity supported Standing balance-Leahy Scale: Poor                              Cognition Arousal/Alertness: Awake/alert Behavior During Therapy: WFL for tasks assessed/performed Overall Cognitive Status: No family/caregiver present to determine baseline cognitive functioning Area of Impairment: Problem solving                             Problem Solving: Slow processing;Difficulty sequencing        Exercises Hand Activities Pick Up, Palm, Put Down: Both;20 reps;Seated Turning Pages: Left;10 reps Other Exercises Other Exercises: Fine motor manipulation (finger to thumb) noted to be able to complete with significant increased time to understand pattern Other Exercises: Utilized Ipad with L index to manipulate game and apps, tremoring noted throughout    General Comments        Pertinent Vitals/Pain Pain Assessment: Faces Faces Pain Scale: Hurts a little bit Pain Location: knees (chronic)  Pain Descriptors / Indicators: Sore Pain Intervention(s): Monitored during session    Home Living  Prior Function            PT Goals (current goals can now be found in the care plan section) Acute Rehab PT Goals Patient Stated Goal: to get stronger Progress towards PT goals: Progressing toward goals    Frequency    Min 4X/week      PT Plan  Current plan remains appropriate    Co-evaluation              AM-PAC PT "6 Clicks" Mobility   Outcome Measure  Help needed turning from your back to your side while in a flat bed without using bedrails?: None Help needed moving from lying on your back to sitting on the side of a flat bed without using bedrails?: A Little Help needed moving to and from a bed to a chair (including a wheelchair)?: A Little Help needed standing up from a chair using your arms (e.g., wheelchair or bedside chair)?: A Little Help needed to walk in hospital room?: A Little Help needed climbing 3-5 steps with a railing? : A Little 6 Click Score: 19    End of Session Equipment Utilized During Treatment: Gait belt Activity Tolerance: Patient tolerated treatment well Patient left: with call bell/phone within reach;in chair Nurse Communication: Mobility status PT Visit Diagnosis: Difficulty in walking, not elsewhere classified (R26.2);Muscle weakness (generalized) (M62.81);Hemiplegia and hemiparesis Hemiplegia - Right/Left: Left Hemiplegia - dominant/non-dominant: Non-dominant Hemiplegia - caused by: Cerebral infarction     Time: 1751-0258 PT Time Calculation (min) (ACUTE ONLY): 28 min  Charges:  $Gait Training: 23-37 mins                     Erline Levine, PTA Acute Rehabilitation Services Pager: [phone] Office: [phone]     Carolynne Edouard 12/09/2019, 5:30 PM

## 2019-12-09 NOTE — Telephone Encounter (Signed)
I spoke to the patient's daughter and she will await a call from Sherri to further discuss the patients options with his upcoming ablation scheduled.  She said that her father is doing well.

## 2019-12-10 ENCOUNTER — Encounter (HOSPITAL_COMMUNITY): Payer: Self-pay | Admitting: Physical Medicine & Rehabilitation

## 2019-12-10 ENCOUNTER — Other Ambulatory Visit: Payer: Self-pay

## 2019-12-10 ENCOUNTER — Inpatient Hospital Stay (HOSPITAL_COMMUNITY)
Admission: RE | Admit: 2019-12-10 | Discharge: 2019-12-17 | DRG: 057 | Disposition: A | Discharge status: Home / Self Care | Payer: Medicare Other | Source: Intra-hospital | Attending: Physical Medicine & Rehabilitation | Admitting: Physical Medicine & Rehabilitation

## 2019-12-10 DIAGNOSIS — M79675 Pain in left toe(s): Secondary | ICD-10-CM

## 2019-12-10 DIAGNOSIS — Z794 Long term (current) use of insulin: Secondary | ICD-10-CM

## 2019-12-10 DIAGNOSIS — I69322 Dysarthria following cerebral infarction: Secondary | ICD-10-CM | POA: Diagnosis not present

## 2019-12-10 DIAGNOSIS — H3530 Unspecified macular degeneration: Secondary | ICD-10-CM | POA: Diagnosis present

## 2019-12-10 DIAGNOSIS — Z7901 Long term (current) use of anticoagulants: Secondary | ICD-10-CM | POA: Diagnosis not present

## 2019-12-10 DIAGNOSIS — K5900 Constipation, unspecified: Secondary | ICD-10-CM | POA: Diagnosis present

## 2019-12-10 DIAGNOSIS — Z8249 Family history of ischemic heart disease and other diseases of the circulatory system: Secondary | ICD-10-CM

## 2019-12-10 DIAGNOSIS — I10 Essential (primary) hypertension: Secondary | ICD-10-CM | POA: Diagnosis present

## 2019-12-10 DIAGNOSIS — Z79899 Other long term (current) drug therapy: Secondary | ICD-10-CM | POA: Diagnosis not present

## 2019-12-10 DIAGNOSIS — E785 Hyperlipidemia, unspecified: Secondary | ICD-10-CM | POA: Diagnosis present

## 2019-12-10 DIAGNOSIS — D649 Anemia, unspecified: Secondary | ICD-10-CM

## 2019-12-10 DIAGNOSIS — I5031 Acute diastolic (congestive) heart failure: Secondary | ICD-10-CM

## 2019-12-10 DIAGNOSIS — D62 Acute posthemorrhagic anemia: Secondary | ICD-10-CM | POA: Diagnosis present

## 2019-12-10 DIAGNOSIS — I69354 Hemiplegia and hemiparesis following cerebral infarction affecting left non-dominant side: Secondary | ICD-10-CM | POA: Diagnosis not present

## 2019-12-10 DIAGNOSIS — F329 Major depressive disorder, single episode, unspecified: Secondary | ICD-10-CM | POA: Diagnosis present

## 2019-12-10 DIAGNOSIS — M79674 Pain in right toe(s): Secondary | ICD-10-CM | POA: Diagnosis present

## 2019-12-10 DIAGNOSIS — I350 Nonrheumatic aortic (valve) stenosis: Secondary | ICD-10-CM | POA: Diagnosis present

## 2019-12-10 DIAGNOSIS — I6340 Cerebral infarction due to embolism of unspecified cerebral artery: Secondary | ICD-10-CM | POA: Diagnosis present

## 2019-12-10 DIAGNOSIS — Z7951 Long term (current) use of inhaled steroids: Secondary | ICD-10-CM

## 2019-12-10 DIAGNOSIS — E1165 Type 2 diabetes mellitus with hyperglycemia: Secondary | ICD-10-CM | POA: Diagnosis present

## 2019-12-10 DIAGNOSIS — I639 Cerebral infarction, unspecified: Secondary | ICD-10-CM | POA: Diagnosis not present

## 2019-12-10 DIAGNOSIS — K219 Gastro-esophageal reflux disease without esophagitis: Secondary | ICD-10-CM | POA: Diagnosis present

## 2019-12-10 DIAGNOSIS — I69392 Facial weakness following cerebral infarction: Secondary | ICD-10-CM

## 2019-12-10 DIAGNOSIS — R2689 Other abnormalities of gait and mobility: Secondary | ICD-10-CM | POA: Diagnosis not present

## 2019-12-10 DIAGNOSIS — J45909 Unspecified asthma, uncomplicated: Secondary | ICD-10-CM | POA: Diagnosis present

## 2019-12-10 DIAGNOSIS — E876 Hypokalemia: Secondary | ICD-10-CM | POA: Diagnosis present

## 2019-12-10 DIAGNOSIS — I63131 Cerebral infarction due to embolism of right carotid artery: Secondary | ICD-10-CM

## 2019-12-10 DIAGNOSIS — I480 Paroxysmal atrial fibrillation: Secondary | ICD-10-CM | POA: Diagnosis present

## 2019-12-10 DIAGNOSIS — E11649 Type 2 diabetes mellitus with hypoglycemia without coma: Secondary | ICD-10-CM

## 2019-12-10 DIAGNOSIS — I6310 Cerebral infarction due to embolism of unspecified precerebral artery: Secondary | ICD-10-CM | POA: Diagnosis not present

## 2019-12-10 DIAGNOSIS — K5901 Slow transit constipation: Secondary | ICD-10-CM

## 2019-12-10 DIAGNOSIS — R531 Weakness: Secondary | ICD-10-CM

## 2019-12-10 DIAGNOSIS — H25013 Cortical age-related cataract, bilateral: Secondary | ICD-10-CM | POA: Diagnosis present

## 2019-12-10 DIAGNOSIS — I6381 Other cerebral infarction due to occlusion or stenosis of small artery: Secondary | ICD-10-CM | POA: Insufficient documentation

## 2019-12-10 DIAGNOSIS — H538 Other visual disturbances: Secondary | ICD-10-CM | POA: Diagnosis present

## 2019-12-10 LAB — CBC
HCT: 30.9 % — ABNORMAL LOW (ref 39.0–52.0)
Hemoglobin: 9.1 g/dL — ABNORMAL LOW (ref 13.0–17.0)
MCH: 26.4 pg (ref 26.0–34.0)
MCHC: 29.4 g/dL — ABNORMAL LOW (ref 30.0–36.0)
MCV: 89.6 fL (ref 80.0–100.0)
Platelets: 293 10*3/uL (ref 150–400)
RBC: 3.45 MIL/uL — ABNORMAL LOW (ref 4.22–5.81)
RDW: 15 % (ref 11.5–15.5)
WBC: 8.8 10*3/uL (ref 4.0–10.5)
nRBC: 0 % (ref 0.0–0.2)

## 2019-12-10 LAB — GLUCOSE, CAPILLARY
Glucose-Capillary: 84 mg/dL (ref 70–99)
Glucose-Capillary: 109 mg/dL — ABNORMAL HIGH (ref 70–99)
Glucose-Capillary: 113 mg/dL — ABNORMAL HIGH (ref 70–99)
Glucose-Capillary: 181 mg/dL — ABNORMAL HIGH (ref 70–99)

## 2019-12-10 LAB — BASIC METABOLIC PANEL
Anion gap: 9 (ref 5–15)
BUN: 11 mg/dL (ref 8–23)
CO2: 28 mmol/L (ref 22–32)
Calcium: 9 mg/dL (ref 8.9–10.3)
Chloride: 106 mmol/L (ref 98–111)
Creatinine, Ser: 0.96 mg/dL (ref 0.61–1.24)
GFR calc Af Amer: >60 mL/min (ref >60)
GFR calc non Af Amer: >60 mL/min (ref >60)
Glucose, Bld: 77 mg/dL (ref 70–99)
Potassium: 3.8 mmol/L (ref 3.5–5.1)
Sodium: 143 mmol/L (ref 135–145)

## 2019-12-10 MED ORDER — SENNOSIDES-DOCUSATE SODIUM 8.6-50 MG PO TABS
1.0000 | ORAL_TABLET | Freq: Two times a day (BID) | ORAL | Status: DC
  Administered 2019-12-10 – 2019-12-15 (×11): 1 via ORAL
  Filled 2019-12-10 (×12): qty 1

## 2019-12-10 MED ORDER — PROCHLORPERAZINE MALEATE 5 MG PO TABS: 5.0000 mg | ORAL_TABLET | Freq: Four times a day (QID) | ORAL | Status: DC | PRN

## 2019-12-10 MED ORDER — FLUTICASONE FUROATE-VILANTEROL 200-25 MCG/INH IN AEPB
1.0000 | INHALATION_SPRAY | Freq: Every day | RESPIRATORY_TRACT | Status: DC | PRN
  Filled 2019-12-10: qty 28

## 2019-12-10 MED ORDER — ASPIRIN EC 81 MG PO TBEC
81.0000 mg | DELAYED_RELEASE_TABLET | Freq: Every day | ORAL | Status: DC
  Administered 2019-12-11 – 2019-12-17 (×7): 81 mg via ORAL
  Filled 2019-12-10 (×7): qty 1

## 2019-12-10 MED ORDER — PROCHLORPERAZINE 25 MG RE SUPP: 12.5000 mg | Freq: Four times a day (QID) | RECTAL | Status: DC | PRN

## 2019-12-10 MED ORDER — ALBUTEROL SULFATE (2.5 MG/3ML) 0.083% IN NEBU: 3.0000 mL | INHALATION_SOLUTION | RESPIRATORY_TRACT | Status: DC | PRN

## 2019-12-10 MED ORDER — AMIODARONE HCL 200 MG PO TABS
200.0000 mg | ORAL_TABLET | Freq: Every day | ORAL | Status: DC
  Administered 2019-12-11 – 2019-12-17 (×7): 200 mg via ORAL
  Filled 2019-12-10 (×7): qty 1

## 2019-12-10 MED ORDER — PANTOPRAZOLE SODIUM 40 MG PO TBEC
40.0000 mg | DELAYED_RELEASE_TABLET | Freq: Every day | ORAL | Status: DC
  Administered 2019-12-11 – 2019-12-17 (×7): 40 mg via ORAL
  Filled 2019-12-10 (×7): qty 1

## 2019-12-10 MED ORDER — INSULIN ASPART PROT & ASPART (70-30 MIX) 100 UNIT/ML ~~LOC~~ SUSP
16.0000 [IU] | Freq: Two times a day (BID) | SUBCUTANEOUS | Status: DC
  Administered 2019-12-10 – 2019-12-13 (×7): 16 [IU] via SUBCUTANEOUS
  Filled 2019-12-10: qty 10

## 2019-12-10 MED ORDER — PROCHLORPERAZINE EDISYLATE 10 MG/2ML IJ SOLN: 5.0000 mg | Freq: Four times a day (QID) | INTRAMUSCULAR | Status: DC | PRN

## 2019-12-10 MED ORDER — BISACODYL 10 MG RE SUPP: 10.0000 mg | Freq: Every day | RECTAL | Status: DC | PRN

## 2019-12-10 MED ORDER — GUAIFENESIN-DM 100-10 MG/5ML PO SYRP: 5.0000 mL | ORAL_SOLUTION | Freq: Four times a day (QID) | ORAL | Status: DC | PRN

## 2019-12-10 MED ORDER — POLYETHYLENE GLYCOL 3350 17 G PO PACK
17.0000 g | PACK | Freq: Every day | ORAL | Status: DC | PRN
  Administered 2019-12-14: 17 g via ORAL
  Filled 2019-12-10: qty 1

## 2019-12-10 MED ORDER — POLYETHYLENE GLYCOL 3350 17 G PO PACK: 17.0000 g | PACK | Freq: Every day | ORAL | 0 refills | Status: AC

## 2019-12-10 MED ORDER — SERTRALINE HCL 100 MG PO TABS
100.0000 mg | ORAL_TABLET | Freq: Every day | ORAL | Status: DC
  Administered 2019-12-11 – 2019-12-17 (×7): 100 mg via ORAL
  Filled 2019-12-10 (×7): qty 1

## 2019-12-10 MED ORDER — ASPIRIN 81 MG PO TBEC: 81.0000 mg | DELAYED_RELEASE_TABLET | Freq: Every day | ORAL | 0 refills | Status: DC

## 2019-12-10 MED ORDER — FLEET ENEMA 7-19 GM/118ML RE ENEM: 1.0000 | ENEMA | Freq: Once | RECTAL | Status: DC | PRN

## 2019-12-10 MED ORDER — INSULIN ASPART PROT & ASPART (70-30 MIX) 100 UNIT/ML ~~LOC~~ SUSP: 16.0000 [IU] | Freq: Two times a day (BID) | SUBCUTANEOUS | 0 refills | Status: DC

## 2019-12-10 MED ORDER — METHOCARBAMOL 500 MG PO TABS: 500.0000 mg | ORAL_TABLET | Freq: Three times a day (TID) | ORAL | Status: DC | PRN

## 2019-12-10 MED ORDER — ACETAMINOPHEN 325 MG PO TABS
325.0000 mg | ORAL_TABLET | ORAL | Status: DC | PRN
  Administered 2019-12-12 – 2019-12-15 (×3): 650 mg via ORAL
  Filled 2019-12-10 (×3): qty 2

## 2019-12-10 MED ORDER — INSULIN ASPART 100 UNIT/ML ~~LOC~~ SOLN
0.0000 [IU] | Freq: Three times a day (TID) | SUBCUTANEOUS | Status: DC
  Administered 2019-12-10: 3 [IU] via SUBCUTANEOUS
  Administered 2019-12-11 (×2): 2 [IU] via SUBCUTANEOUS
  Administered 2019-12-12: 3 [IU] via SUBCUTANEOUS
  Administered 2019-12-12: 2 [IU] via SUBCUTANEOUS
  Administered 2019-12-13: 3 [IU] via SUBCUTANEOUS
  Administered 2019-12-14 – 2019-12-16 (×5): 2 [IU] via SUBCUTANEOUS

## 2019-12-10 MED ORDER — ATORVASTATIN CALCIUM 80 MG PO TABS
80.0000 mg | ORAL_TABLET | Freq: Every day | ORAL | Status: DC
  Administered 2019-12-11 – 2019-12-17 (×7): 80 mg via ORAL
  Filled 2019-12-10 (×7): qty 1

## 2019-12-10 MED ORDER — FUROSEMIDE 40 MG PO TABS: 40.0000 mg | ORAL_TABLET | Freq: Every day | ORAL | 0 refills | Status: DC

## 2019-12-10 MED ORDER — FLUTICASONE FUROATE-VILANTEROL 200-25 MCG/INH IN AEPB
1.0000 | INHALATION_SPRAY | Freq: Every day | RESPIRATORY_TRACT | Status: DC
  Administered 2019-12-10 – 2019-12-17 (×7): 1 via RESPIRATORY_TRACT
  Filled 2019-12-10: qty 28

## 2019-12-10 MED ORDER — ALUM & MAG HYDROXIDE-SIMETH 200-200-20 MG/5ML PO SUSP: 30.0000 mL | ORAL | Status: DC | PRN

## 2019-12-10 MED ORDER — DIPHENHYDRAMINE HCL 12.5 MG/5ML PO ELIX: 12.5000 mg | ORAL_SOLUTION | Freq: Four times a day (QID) | ORAL | Status: DC | PRN

## 2019-12-10 MED ORDER — APIXABAN 5 MG PO TABS
5.0000 mg | ORAL_TABLET | Freq: Two times a day (BID) | ORAL | Status: DC
  Administered 2019-12-10 – 2019-12-17 (×14): 5 mg via ORAL
  Filled 2019-12-10 (×14): qty 1

## 2019-12-10 MED ORDER — TRAZODONE HCL 50 MG PO TABS: 25.0000 mg | ORAL_TABLET | Freq: Every evening | ORAL | Status: DC | PRN

## 2019-12-10 NOTE — TOC Transition Note (Signed)
Transition of Care [facility]) - CM/SW Discharge Note   Patient Details  Name: Brandon Sanchez MRN: [id number] Date of Birth: Feb 26, 1946  Transition of Care [facility] CM/SW Contact:  Kermit Balo, RN Phone Number: 12/10/2019, 10:43 AM   Clinical Narrative:    Pt is discharging to CIR today. CM signing off.   Final next level of care: IP Rehab Facility Barriers to Discharge: No Barriers Identified   Patient Goals and CMS Choice Patient states their goals for this hospitalization and ongoing recovery are:: to get to inpatient rehab CMS Medicare.gov Compare Post Acute Care list provided to:: Patient Represenative (must comment) Choice offered to / list presented to : Adult Children  Discharge Placement                       Discharge Plan and Services   Discharge Planning Services: CM Consult Post Acute Care Choice: IP Rehab                               Social Determinants of Health (SDOH) Interventions     Readmission Risk Interventions No flowsheet data found.

## 2019-12-10 NOTE — Progress Notes (Signed)
Report given to rehab RN;  Patient transported via wheelchair to 4w02; all belongings returned.

## 2019-12-10 NOTE — Progress Notes (Signed)
Inpatient Rehabilitation Medication Review by a Pharmacist  A complete drug regimen review was completed for this patient to identify any potential clinically significant medication issues.  Clinically significant medication issues were identified:  no   Type of Medication Issue Identified Description of Issue Plan Plan Accepted by Provider? (Yes / No)  Drug Interaction(s) (clinically significant)      Duplicate Therapy      Allergy      No Medication Administration End Date      Incorrect Dose      Additional Drug Therapy Needed      Other        Name of provider notified for issues identified: n/a  Provider Method of Notification: n/a   Pharmacist comments: n/a  Time spent performing this drug regimen review (minutes):  5   Lucia Gaskins 12/10/2019 5:12 PM

## 2019-12-10 NOTE — Progress Notes (Signed)
Horton Chin, MD  Physician  Physical Medicine and Rehabilitation  Consult Note     Signed  Date of Service:  12/06/2019  8:49 AM      Related encounter: ED to Hosp-Admission (Discharged) from 12/03/2019 in [place] [facility]      Signed      Expand AllCollapse All   Show:Clear all Manual[x] Template[x] Copied  Added by: Love, Evlyn Kanner, PA-C[x] Raulkar, Drema Pry, MD  Hover for details          Physical Medicine and Rehabilitation Consult     Reason for Consult: Functional decline Referring Physician: Dr. Lucianne Muss      HPI: Brandon Sanchez is a 74 y.o. RH-male with history of T2DM, HTN, cataracts-  blurry vision, alcohol abuse--quit 08/2019, A fib-on Eliquis s/p cardioversion 10/18/19; who was admitted on 12/03/19 with acute onset of left sided weakness with left facial droop and dysarthria. CT head negative for acute changes. CTA head/neck was negative for LVO, atherosclerotic changes distal CCA and at carotid bifurcation, 50% stenosis L-VA  and showed degenerative change cervical spine with fusion at C3/4 and C6/7.  MRI/MRA brain showed small acute right basal ganglia/CR infarct and MRA negative. 2D echo showed EF 55-60% with moderate AS and mild concentric LVH with grade III DD. Dr. 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 .    Patient has had fluctuating left sided symptoms due to hypotension--metoprolol d/c and Lasix dose decreased to avoid hypoperfusion.  Cognitive eval done and WNL. He continues to be limited by left sided weakness but activity tolerance improving and CIR recommended due to functional decline.      Review of Systems  HENT: Positive for hearing loss.   Eyes: Positive for blurred vision (bilateral eyes --needs cataracts surgery).  Cardiovascular: Negative for chest pain and palpitations.  Musculoskeletal: Positive for joint pain  (bilateral knees--RLE weaker. Hip pain). Negative for back pain.  Neurological: Positive for focal weakness.          Past Medical History:  Diagnosis Date  . Allergic rhinitis, cause unspecified    . Asthma    . Cataract cortical, senile, bilateral    . Macular degeneration of left eye    . Other and unspecified hyperlipidemia    . Type II or unspecified type diabetes mellitus without mention of complication, not stated as uncontrolled    . Unspecified essential hypertension             Past Surgical History:  Procedure Laterality Date  . CARDIOVERSION N/A 09/22/2019    Procedure: CARDIOVERSION;  Surgeon: [name], MD;  Location: [facility] ENDOSCOPY;  Service: Endoscopy;  Laterality: N/A;  . CARDIOVERSION N/A 10/18/2019    Procedure: CARDIOVERSION;  Surgeon: Jake Bathe, MD;  Location: [facility] ENDOSCOPY;  Service: Cardiovascular;  Laterality: N/A;  . TEE WITHOUT CARDIOVERSION N/A 09/22/2019    Procedure: TRANSESOPHAGEAL ECHOCARDIOGRAM (TEE);  Surgeon: [name], MD;  Location: [facility] ENDOSCOPY;  Service: Endoscopy;  Laterality: N/A;           Family History  Problem Relation Age of Onset  . Colon cancer Father    . Heart disease Paternal Grandfather        Social History:   Retired Clinical research associate. Lives alone and independent PTA.  Daughter lives in [place]. He reports that he has never smoked. He has never used smokeless tobacco. He reports  history of alcohol abuse--quit on and off used to drink 2 Liter bottle of wine/night--quit Jan 30th/21.  He reports that he does not use drugs.      Allergies: No Known Allergies            Medications Prior to Admission  Medication Sig Dispense Refill  . acetaminophen (TYLENOL) 500 MG tablet Take 1,000 mg by mouth every 6 (six) hours as needed for moderate pain or headache.       . albuterol (PROAIR HFA) 108 (90 BASE) MCG/ACT inhaler 2 puffs every 4 hours as needed only  if your can't catch your breath (Patient taking differently: Inhale  2 puffs into the lungs every 4 (four) hours as needed for wheezing. ) 1 Inhaler 1  . amiodarone (PACERONE) 200 MG tablet Take 1 tablet (200 mg total) by mouth daily. 90 tablet 2  . apixaban (ELIQUIS) 5 MG TABS tablet Take 1 tablet (5 mg total) by mouth 2 (two) times daily. 60 tablet 6  . atorvastatin (LIPITOR) 40 MG tablet Take 40 mg by mouth every evening.       Marland Kitchen BREO ELLIPTA 200-25 MCG/INH AEPB Inhale 1 puff into the lungs daily as needed (shortness of breath).       . furosemide (LASIX) 40 MG tablet Take 2 tablets (80 mg total) by mouth daily. May take additional 40 mg if notice swelling of shortness of breath 30 tablet    . Glucosamine-Chondroit-Vit C-Mn (GLUCOSAMINE CHONDR 1500 COMPLX) CAPS Take 1 capsule by mouth 2 (two) times daily.      . insulin aspart protamine- aspart (NOVOLOG MIX 70/30) (70-30) 100 UNIT/ML injection Inject 20 Units into the skin 2 (two) times daily at 8 am and 10 pm. Dose varies - Prescription is for 20 units with each meal (twice daily). However administration depends on what types of meals being taken.      . metoprolol succinate (TOPROL-XL) 50 MG 24 hr tablet Take 1 tablet (50 mg total) by mouth daily. Take with or immediately following a meal. 90 tablet 1  . Multiple Vitamin (MULTIVITAMIN WITH MINERALS) TABS tablet Take 1 tablet by mouth daily.      Marland Kitchen omeprazole (PRILOSEC) 40 MG capsule Take 40 mg by mouth daily as needed (heart burn).       Letta Pate VERIO test strip 1 each by Other route in the morning, at noon, and at bedtime.       . potassium chloride (KLOR-CON) 10 MEQ tablet Take 10 mEq by mouth at bedtime.      Marland Kitchen PREVIDENT 5000 BOOSTER PLUS 1.1 % PSTE Place 1 application onto teeth in the morning and at bedtime.       . sertraline (ZOLOFT) 100 MG tablet Take 100 mg by mouth daily.          Home: Home Living Family/patient expects to be discharged to:: Private residence Living Arrangements: Alone Available Help at Discharge: Family, Other (Comment) Type  of Home: House Home Access: Stairs to enter [employer] of Steps: 10 from parking spot to door Entrance Stairs-Rails: Left Home Layout: Two level, Bed/bath upstairs, 1/2 bath on main level Alternate Level Stairs-Number of Steps: 17 with landing Alternate Level Stairs-Rails: Right Bathroom Shower/Tub: Tub/shower unit, Health visitor: Handicapped height Bathroom Accessibility: Yes Home Equipment: Cane - single point  Functional History: Prior Function Level of Independence: Needs assistance Gait / Transfers Assistance Needed: pt navigating stairs with slow step-to gait pattern with L foot leading with use  of rails. reports no use of AD at home, use of driver and then [facility] for Dr visits ADL's / Homemaking Assistance Needed: pt reports home care service that does general home maintenance and cleaning, pt reports he does his own cooking adn ADL Functional Status:  Mobility: Bed Mobility Overal bed mobility: Needs Assistance Bed Mobility: Rolling, Sidelying to Sit Rolling: Min assist Sidelying to sit: Min assist Sit to supine: Max assist General bed mobility comments: pt able to initiate movement of BLE to EOB with minA to complete, then minA to raise trunk from elevated HOB with sig use of bed rail. Transfers Overall transfer level: Needs assistance Equipment used: Rolling walker (2 wheeled) Transfers: Sit to/from Stand, [employer] Transfers Sit to Stand: Mod assist, +2 physical assistance, Min assist Squat pivot transfers: Mod assist, +2 physical assistance, Max assist  Lateral/Scoot Transfers: Mod assist General transfer comment: pt able to complete sit-stand from elevated HOB with min/modA of 2 to power up and minA of 2 to steady upon rise. The pt was then able to take small lateral steps to recliner with significant fatige and increased need for assist Ambulation/Gait Ambulation/Gait assistance: Mod assist, +2 physical assistance Gait Distance (Feet): 4  Feet Assistive device: Rolling walker (2 wheeled) Gait Pattern/deviations: Step-to pattern, Shuffle, Trunk flexed General Gait Details: pt with slow step-to movements of BLE with VC for each step/RW movement. Pt with difficulty moving LLE to R, pt fatigues quickly and has significant L lean with fatigue   ADL: ADL Overall ADL's : Needs assistance/impaired Eating/Feeding: Minimal assistance Grooming: Minimal assistance, Bed level Upper Body Bathing: Moderate assistance, Bed level Lower Body Bathing: Maximal assistance, Bed level Upper Body Dressing : Maximal assistance, Sitting Lower Body Dressing: Total assistance, Bed level Toilet Transfer: (will need lift equipment) Toileting- Clothing Manipulation and Hygiene: Total assistance Toileting - Clothing Manipulation Details (indicate cue type and reason): declined to try male urinal; only wanting condom cath Functional mobility during ADLs: (will need +2)   Cognition: Cognition Overall Cognitive Status: Within Functional Limits for tasks assessed Arousal/Alertness: Awake/alert Orientation Level: Oriented X4 Attention: Focused, Sustained Focused Attention: Appears intact Sustained Attention: Appears intact Memory: Appears intact(Immediate: 5/5; delayed: 5/5) Awareness: Appears intact Problem Solving: Appears intact Executive Function: Reasoning, Sequencing Reasoning: (Abstract: 2/2) Sequencing: Appears intact Cognition Arousal/Alertness: Awake/alert Behavior During Therapy: WFL for tasks assessed/performed Overall Cognitive Status: Within Functional Limits for tasks assessed Area of Impairment: Attention, Safety/judgement, Awareness, Problem solving Current Attention Level: Selective Safety/Judgement: Decreased awareness of safety, Decreased awareness of deficits Awareness: Emergent Problem Solving: Slow processing General Comments: Pt with improved engagement and motivation today     Blood pressure 117/64, pulse 65,  temperature 98.4 F (36.9 C), temperature source Oral, resp. rate 19, height  (1.88 m), weight 123.6 kg, SpO2 95 %.   Physical Exam  Nursing note and vitals reviewed. Constitutional: He appears well-developed and well-nourished. Obese male. NAD  General: Alert and oriented x 3, No apparent distress HEENT: Head is normocephalic, atraumatic, PERRLA, EOMI, sclera anicteric, oral mucosa pink and moist, dentition intact, ext ear canals clear,  Neck: Supple without JVD or lymphadenopathy Heart: Reg rate and rhythm. No murmurs rubs or gallops Chest: CTA bilaterally without wheezes, rales, or rhonchi; no distress Abdomen: Soft, non-tender, non-distended, bowel sounds positive. Extremities: No clubbing, cyanosis, or edema. Pulses are 2+ Skin: Clean and intact without signs of breakdown Neuro/MSK: Decreased hearing. Minimal dysarthria. He was able to follow simple commands without difficulty. Left sided weakness with decrease in fine  motor movements.   Psych: Pt's affect is appropriate. Pt is cooperative  Lab Results Last 24 Hours       Results for orders placed or performed during the hospital encounter of 12/03/19 (from the past 24 hour(s))  Glucose, capillary     Status: Abnormal    Collection Time: 12/05/19 12:32 PM  Result Value Ref Range    Glucose-Capillary 112 (H) 70 - 99 mg/dL  Glucose, capillary     Status: Abnormal    Collection Time: 12/05/19  5:12 PM  Result Value Ref Range    Glucose-Capillary 113 (H) 70 - 99 mg/dL  Glucose, capillary     Status: None    Collection Time: 12/05/19  9:18 PM  Result Value Ref Range    Glucose-Capillary 98 70 - 99 mg/dL    Comment 1 Notify RN      Comment 2 Document in Chart    CBC     Status: Abnormal    Collection Time: 12/06/19  4:11 AM  Result Value Ref Range    WBC 10.1 4.0 - 10.5 K/uL    RBC 3.53 (L) 4.22 - 5.81 MIL/uL    Hemoglobin 9.6 (L) 13.0 - 17.0 g/dL    HCT 16.131.2 (L) 09.639.0 - 52.0 %    MCV 88.4 80.0 - 100.0 fL    MCH 27.2 26.0  - 34.0 pg    MCHC 30.8 30.0 - 36.0 g/dL    RDW 04.514.6 40.911.5 - 81.115.5 %    Platelets 354 150 - 400 K/uL    nRBC 0.0 0.0 - 0.2 %  Magnesium     Status: None    Collection Time: 12/06/19  4:11 AM  Result Value Ref Range    Magnesium 2.1 1.7 - 2.4 mg/dL  Phosphorus     Status: None    Collection Time: 12/06/19  4:11 AM  Result Value Ref Range    Phosphorus 4.2 2.5 - 4.6 mg/dL  Basic metabolic panel     Status: Abnormal    Collection Time: 12/06/19  4:11 AM  Result Value Ref Range    Sodium 144 135 - 145 mmol/L    Potassium 3.7 3.5 - 5.1 mmol/L    Chloride 104 98 - 111 mmol/L    CO2 29 22 - 32 mmol/L    Glucose, Bld 75 70 - 99 mg/dL    BUN 16 8 - 23 mg/dL    Creatinine, Ser 9.141.29 (H) 0.61 - 1.24 mg/dL    Calcium 9.0 8.9 - 78.210.3 mg/dL    GFR calc non Af Amer 55 (L) >60 mL/min    GFR calc Af Amer >60 >60 mL/min    Anion gap 11 5 - 15  Glucose, capillary     Status: None    Collection Time: 12/06/19  6:11 AM  Result Value Ref Range    Glucose-Capillary 72 70 - 99 mg/dL    Comment 1 Notify RN      Comment 2 Document in Chart         Imaging Results (Last 48 hours)  MR ANGIO HEAD WO CONTRAST   Result Date: 12/04/2019 CLINICAL DATA:  TIA. Left-sided weakness and facial droop. EXAM: MRI HEAD WITHOUT CONTRAST MRA HEAD WITHOUT CONTRAST TECHNIQUE: Multiplanar, multiecho pulse sequences of the brain and surrounding structures were obtained without intravenous contrast. Angiographic images of the head were obtained using MRA technique without contrast. COMPARISON:  Head CT and CTA 12/03/2019 FINDINGS: MRI HEAD FINDINGS Brain: There is a small  acute infarct involving the right corona radiata and posterior right lentiform nucleus. T2 hyperintensities in the cerebral white matter bilaterally are nonspecific but compatible with mild chronic small vessel ischemic disease. Mild cerebral atrophy is within normal limits for age. No intracranial hemorrhage, mass, midline shift, or extra-axial fluid collection  is identified. Vascular: Major intracranial vascular flow voids are preserved. Skull and upper cervical spine: Decreased bone marrow T1 signal intensity in the skull and included upper cervical spine, nonspecific but may be related to the patient's known anemia. Sinuses/Orbits: Unremarkable orbits. Mild mucosal thickening in the paranasal sinuses. Trace left mastoid fluid. Other: None. MRA HEAD FINDINGS The visualized distal vertebral arteries are patent to the basilar with a fenestration partially visualized in the right vertebral artery, a normal variant. The left PICA and right AICA appear dominant. Patent SCAs are seen bilaterally. The basilar artery is widely patent. There is a moderately large right posterior communicating artery. Both PCAs are patent without evidence of significant proximal stenosis. The internal carotid arteries are widely patent from skull base to carotid termini. ACAs and MCAs are patent without evidence of proximal branch occlusion or significant proximal stenosis. No aneurysm is identified. IMPRESSION: 1. 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 2. Mild chronic small vessel ischemic disease. 3. Negative head MRA. Electronically Signed   By: Sebastian Ache M.D.   On: 12/04/2019 09:37    MR BRAIN WO CONTRAST   Result Date: 12/04/2019 CLINICAL DATA:  TIA. Left-sided weakness and facial droop. EXAM: MRI HEAD WITHOUT CONTRAST MRA HEAD WITHOUT CONTRAST TECHNIQUE: Multiplanar, multiecho pulse sequences of the brain and surrounding structures were obtained without intravenous contrast. Angiographic images of the head were obtained using MRA technique without contrast. COMPARISON:  Head CT and CTA 12/03/2019 FINDINGS: MRI HEAD FINDINGS Brain: There is a small acute infarct involving the right corona radiata and posterior right lentiform nucleus. T2 hyperintensities in the cerebral white matter bilaterally are nonspecific but compatible with mild chronic small vessel  ischemic disease. Mild cerebral atrophy is within normal limits for age. No intracranial hemorrhage, mass, midline shift, or extra-axial fluid collection is identified. Vascular: Major intracranial vascular flow voids are preserved. Skull and upper cervical spine: Decreased bone marrow T1 signal intensity in the skull and included upper cervical spine, nonspecific but may be related to the patient's known anemia. Sinuses/Orbits: Unremarkable orbits. Mild mucosal thickening in the paranasal sinuses. Trace left mastoid fluid. Other: None. MRA HEAD FINDINGS The visualized distal vertebral arteries are patent to the basilar with a fenestration partially visualized in the right vertebral artery, a normal variant. The left PICA and right AICA appear dominant. Patent SCAs are seen bilaterally. The basilar artery is widely patent. There is a moderately large right posterior communicating artery. Both PCAs are patent without evidence of significant proximal stenosis. The internal carotid arteries are widely patent from skull base to carotid termini. ACAs and MCAs are patent without evidence of proximal branch occlusion or significant proximal stenosis. No aneurysm is identified. IMPRESSION: 1. 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 2. Mild chronic small vessel ischemic disease. 3. Negative head MRA. Electronically Signed   By: Sebastian Ache M.D.   On: 12/04/2019 09:37    DG CHEST PORT 1 VIEW   Result Date: 12/04/2019 CLINICAL DATA:  Left-sided weakness and facial droop EXAM: PORTABLE CHEST 1 VIEW COMPARISON:  09/19/2019 FINDINGS: Single frontal view of the chest was performed. Cardiac silhouette is stable. There is veiling opacity at the left lung base, new  since prior study. No pneumothorax. No acute bony abnormalities. IMPRESSION: 1. Left basilar veiling opacity consistent with consolidation and/or effusion. Electronically Signed   By: Randa Ngo M.D.   On: 12/04/2019 19:13     ECHOCARDIOGRAM COMPLETE   Result Date: 12/04/2019    ECHOCARDIOGRAM REPORT   Patient Name:   Brandon Sanchez Date of Exam: 12/04/2019 Medical Rec #:  [id number]        Height:       74.0 in Accession #:    [id number]       Weight:       272.5 lb Date of Birth:  1946-01-12        BSA:          2.479 m Patient Age:    67 years         BP:           104/79 mmHg Patient Gender: M                HR:           70 bpm. Exam Location:  Inpatient Procedure: 2D Echo, Cardiac Doppler and Color Doppler Indications:    TIA 435.9 / G45.9  History:        Patient has prior history of Echocardiogram examinations, most                 recent 09/20/2019. Risk Factors:Hypertension, Diabetes and                 Dyslipidemia. Cerebral embolism with cerebral                 infarction,Paroxysmal atrial fibrillation,Obesity.  Sonographer:    Alvino Chapel RCS Referring Phys: [id number]  1. Left ventricular ejection fraction, by estimation, is 55 to 60%. The left ventricle has normal function. The left ventricle has no regional wall motion abnormalities. The left ventricular internal cavity size was mildly dilated. There is mild concentric left ventricular hypertrophy. Left ventricular diastolic parameters are consistent with Grade III diastolic dysfunction (restrictive). Elevated left ventricular end-diastolic pressure.  2. Right ventricular systolic function is normal. The right ventricular size is mildly enlarged.  3. Left atrial size was mildly dilated.  4. Right atrial size was mild to moderately dilated.  5. The mitral valve is grossly normal. Trivial mitral valve regurgitation. No evidence of mitral stenosis.  6. The aortic valve is tricuspid. Aortic valve regurgitation is mild to moderate. Moderate aortic valve stenosis.  7. The inferior vena cava is dilated in size with >50% respiratory variability, suggesting right atrial pressure of 8 mmHg. Conclusion(s)/Recommendation(s): Elevated LVEDP, restrictive  diastolic filling, moderate aortic stenosis with mild-moderate aortic regurgitation. FINDINGS  Left Ventricle: Left ventricular ejection fraction, by estimation, is 55 to 60%. The left ventricle has normal function. The left ventricle has no regional wall motion abnormalities. The left ventricular internal cavity size was mildly dilated. There is  mild concentric left ventricular hypertrophy. Left ventricular diastolic parameters are consistent with Grade III diastolic dysfunction (restrictive). Elevated left ventricular end-diastolic pressure. Right Ventricle: The right ventricular size is mildly enlarged. No increase in right ventricular wall thickness. Right ventricular systolic function is normal. Left Atrium: Left atrial size was mildly dilated. Right Atrium: Right atrial size was mild to moderately dilated. Pericardium: Trivial pericardial effusion is present. Presence of pericardial fat pad. Mitral Valve: The mitral valve is grossly normal. Trivial mitral valve regurgitation. No evidence of mitral valve stenosis. Tricuspid Valve: The  tricuspid valve is grossly normal. Tricuspid valve regurgitation is trivial. No evidence of tricuspid stenosis. Aortic Valve: The aortic valve is tricuspid. . There is moderate thickening and moderate calcification of the aortic valve. Aortic valve regurgitation is mild to moderate. Aortic regurgitation PHT measures 476 msec. Moderate aortic stenosis is present. There is moderate thickening of the aortic valve. There is moderate calcification of the aortic valve. Aortic valve mean gradient measures 30.5 mmHg. Aortic valve peak gradient measures 48.0 mmHg. Aortic valve area, by VTI measures 1.09 cm. Pulmonic Valve: The pulmonic valve was not well visualized. Pulmonic valve regurgitation is not visualized. No evidence of pulmonic stenosis. Aorta: The aortic root and ascending aorta are structurally normal, with no evidence of dilitation. Venous: The inferior vena cava is dilated  in size with greater than 50% respiratory variability, suggesting right atrial pressure of 8 mmHg. IAS/Shunts: The atrial septum is grossly normal. Additional Comments: There is a small pleural effusion.  LEFT VENTRICLE PLAX 2D LVIDd:         5.50 cm  Diastology LVIDs:         3.61 cm  LV e' lateral:   7.18 cm/s LV PW:         1.20 cm  LV E/e' lateral: 21.6 LV IVS:        1.20 cm  LV e' medial:    5.77 cm/s LVOT diam:     1.90 cm  LV E/e' medial:  26.9 LV SV:         100 LV SV Index:   40 LVOT Area:     2.84 cm  RIGHT VENTRICLE RV S prime:     13.50 cm/s TAPSE (M-mode): 1.9 cm LEFT ATRIUM             Index       RIGHT ATRIUM           Index LA diam:        4.50 cm 1.82 cm/m  RA Area:     31.00 cm LA Vol (A2C):   95.4 ml 38.49 ml/m RA Volume:   115.00 ml 46.39 ml/m LA Vol (A4C):   72.0 ml 29.05 ml/m LA Biplane Vol: 83.0 ml 33.48 ml/m  AORTIC VALVE AV Area (Vmax):    1.13 cm AV Area (Vmean):   1.01 cm AV Area (VTI):     1.09 cm AV Vmax:           346.50 cm/s AV Vmean:          261.000 cm/s AV VTI:            0.915 m AV Peak Grad:      48.0 mmHg AV Mean Grad:      30.5 mmHg LVOT Vmax:         138.00 cm/s LVOT Vmean:        92.800 cm/s LVOT VTI:          0.352 m LVOT/AV VTI ratio: 0.38 AI PHT:            476 msec  AORTA Ao Root diam: 3.30 cm MITRAL VALVE MV Area (PHT): 2.97 cm     SHUNTS MV Decel Time: 256 msec     Systemic VTI:  0.35 m MV E velocity: 155.00 cm/s  Systemic Diam: 1.90 cm MV A velocity: 94.60 cm/s MV E/A ratio:  1.64 Jodelle Red MD Electronically signed by Jodelle Red MD Signature Date/Time: 12/04/2019/4:02:44 PM    Final  Assessment/Plan: Diagnosis: Left sided weakness and facial droop secondary to 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 1. Does the need for close, 24 hr/day medical supervision in concert with the patient's rehab needs make it unreasonable for this patient to be served in a less  intensive setting? Yes 2. Co-Morbidities requiring supervision/potential complications: diabetes mellitus type 2, paroxysmal atrial fibrillation, hyperlipidemia, hypertension  3. Due to bladder management, bowel management, safety, skin/wound care, disease management, medication administration, pain management and patient education, does the patient require 24 hr/day rehab nursing? Yes 4. Does the patient require coordinated care of a physician, rehab nurse, therapy disciplines of PT, OT to address physical and functional deficits in the context of the above medical diagnosis(es)? Yes Addressing deficits in the following areas: balance, endurance, locomotion, strength, transferring, bowel/bladder control, bathing, dressing, feeding, grooming, toileting and psychosocial support 5. Can the patient actively participate in an intensive therapy program of at least 3 hrs of therapy per day at least 5 days per week? Yes 6. The potential for patient to make measurable gains while on inpatient rehab is excellent 7. Anticipated functional outcomes upon discharge from inpatient rehab are modified independent  with PT, modified independent with OT, independent with SLP. 8. Estimated rehab length of stay to reach the above functional goals is: 10-14 days 9. Anticipated discharge destination: Home 10. Overall Rehab/Functional Prognosis: excellent   RECOMMENDATIONS: This patient's condition is appropriate for continued rehabilitative care in the following setting: CIR Patient has agreed to participate in recommended program. Yes Note that insurance prior authorization may be required for reimbursement for recommended care.   Comment: Brandon Sanchez would be an excellent CIR candidate. He will have the assistance of his daughter 24/7 upon discharge home.    Jacquelynn Cree, PA-C 12/06/2019    I have personally performed a face to face diagnostic evaluation, including, but not limited to relevant history and physical  exam findings, of this patient and developed relevant assessment and plan.  Additionally, I have reviewed and concur with the physician assistant's documentation above.   Sula Soda, MD        Revision History                               Routing History

## 2019-12-10 NOTE — Progress Notes (Signed)
Patient ID: Brandon Sanchez, male   DOB: [birth year], 74 y.o.   MRN: [id number] Patient arrived from 3W15 with RN, patient belongings, and NT. Patient oriented to rehab process, nurse call system, fall prevention plan,rehab schedule, and health resource notebook with verbal understanding. Patient resting in bed with bed alarm on, no complaints of pain, and all questions answered.

## 2019-12-10 NOTE — Progress Notes (Signed)
Inpatient Rehabilitation Admissions Coordinator  Inpatient rehab bed is available to admit this patient to today. I met with patient at bedside and he is in agreement.  Vida Roller, RN CM  And Dr. Posey Pronto made aware. I will make the arrangements to admit today.  Danne Baxter, RN, MSN Rehab Admissions Coordinator [phone] 12/10/2019 10:43 AM'

## 2019-12-10 NOTE — Telephone Encounter (Signed)
Left detailed message that planned ablation should be cancelled for now and wait several months for recovery, per Dr. Elberta Fortis. Aware I will follow up at later date to discuss this further. Advised to call back if she any questions prior to me calling her w/i next few weeks.

## 2019-12-10 NOTE — H&P (Signed)
Physical Medicine and Rehabilitation Admission H&P    Chief Complaint  Patient presents with  . Stroke with functional deficits.     HPI: Brandon Sanchez is a 74 year old RH male with history of T2DM, HTN, cataracts--blurry vision/needs surgery, alcohol abuse--quit 1/21, recent diagnosis of A. Fib--on Eliquis who was admitted on 12/03/2019 with left hemiparesis, facial droop and dysarthria.  History taken from chart review, patient, and attending physician.  CT head unremarkable for acute intracranial process.  CTA head/neck was negative for LVO, atherosclerotic changes distal CCA and carotid bifurcation, 50% stenosis left-VA and showed degenerative changes of cervical spine.  MRI brain personally reviewed, showing small right brain infarct.  Per report, MRI/MRI brain, acute right basal ganglia/CR infarct.  Echocardiogram with ejection fraction of 55 to 60% with moderate aortic stenosis and mild concentric LVH with grade 3 DD.  Dr. Roda Shutters felt the stroke was due to large vessel disease-- likely right CCA mixed plaque and low-dose ASA added to Eliquis.  Lipitor increased to 80 mg.  Patient has had fluctuating left-sided symptoms due to hypotension therefore metoprolol and Lasix have been discontinued.  He has had some increasing bipedal edema with reports of pain and numbness.  Discussed patient's recent complaint of toe pain and discharge with attending physician.  Patient continues to be limited by left-sided weakness affecting ADLs and mobility.  CIR recommended for follow-up therapy.  Please see preadmission assessment from earlier today as well.  Review of Systems  Constitutional: Negative for fever.  HENT: Negative for hearing loss and tinnitus.   Eyes: Positive for blurred vision (due to cataracts).  Respiratory: Negative for cough and shortness of breath.   Cardiovascular: Positive for leg swelling (acute on chronic). Negative for chest pain.  Gastrointestinal: Negative for abdominal pain,  constipation, heartburn and nausea.  Genitourinary: Positive for frequency and urgency.       Overactive bladder--gets up at least twice a night. Has seen GU  Musculoskeletal: Positive for joint pain and myalgias. Negative for back pain and neck pain.  Neurological: Positive for sensory change (numbness /pain bilateral feet) and focal weakness.  Psychiatric/Behavioral: The patient does not have insomnia.     Past Medical History:  Diagnosis Date  . Allergic rhinitis, cause unspecified   . Asthma   . Cataract cortical, senile, bilateral   . Macular degeneration of left eye   . Other and unspecified hyperlipidemia   . Type II or unspecified type diabetes mellitus without mention of complication, not stated as uncontrolled   . Unspecified essential hypertension     Past Surgical History:  Procedure Laterality Date  . CARDIOVERSION N/A 09/22/2019   Procedure: CARDIOVERSION;  Surgeon: [name], MD;  Location: [facility] ENDOSCOPY;  Service: Endoscopy;  Laterality: N/A;  . CARDIOVERSION N/A 10/18/2019   Procedure: CARDIOVERSION;  Surgeon: Jake Bathe, MD;  Location: [facility] ENDOSCOPY;  Service: Cardiovascular;  Laterality: N/A;  . TEE WITHOUT CARDIOVERSION N/A 09/22/2019   Procedure: TRANSESOPHAGEAL ECHOCARDIOGRAM (TEE);  Surgeon: [name], MD;  Location: [facility] ENDOSCOPY;  Service: Endoscopy;  Laterality: N/A;    Family History  Problem Relation Age of Onset  . Colon cancer Father   . Heart disease Paternal Grandfather     Social History:  Lives alone. Independent PTA. Retired Clinical research associate. He reports that he has never smoked. He has never used smokeless tobacco. He reports alcohol use--drinks 2 liter bottle of wine/night--quit in Jan.  He reports that he does not use drugs.  Allergies: No Known Allergies    Medications Prior to Admission  Medication Sig Dispense Refill  . acetaminophen (TYLENOL) 500 MG tablet Take 1,000 mg by mouth every 6 (six) hours as needed for moderate  pain or headache.     . albuterol (PROAIR HFA) 108 (90 BASE) MCG/ACT inhaler 2 puffs every 4 hours as needed only  if your can't catch your breath (Patient taking differently: Inhale 2 puffs into the lungs every 4 (four) hours as needed for wheezing. ) 1 Inhaler 1  . amiodarone (PACERONE) 200 MG tablet Take 1 tablet (200 mg total) by mouth daily. 90 tablet 2  . apixaban (ELIQUIS) 5 MG TABS tablet Take 1 tablet (5 mg total) by mouth 2 (two) times daily. 60 tablet 6  . [START ON 12/11/2019] aspirin EC 81 MG EC tablet Take 1 tablet (81 mg total) by mouth daily. 60 tablet 0  . atorvastatin (LIPITOR) 40 MG tablet Take 40 mg by mouth every evening.     Marland Kitchen BREO ELLIPTA 200-25 MCG/INH AEPB Inhale 1 puff into the lungs daily as needed (shortness of breath).     . furosemide (LASIX) 40 MG tablet Take 1 tablet (40 mg total) by mouth daily. May take additional 40 mg if notice swelling of shortness of breath 30 tablet 0  . Glucosamine-Chondroit-Vit C-Mn (GLUCOSAMINE CHONDR 1500 COMPLX) CAPS Take 1 capsule by mouth 2 (two) times daily.    . insulin aspart protamine- aspart (NOVOLOG MIX 70/30) (70-30) 100 UNIT/ML injection Inject 0.16 mLs (16 Units total) into the skin 2 (two) times daily at 8 am and 10 pm. Dose varies - Prescription is for 20 units with each meal (twice daily). However administration depends on what types of meals being taken. 10 mL 0  . Multiple Vitamin (MULTIVITAMIN WITH MINERALS) TABS tablet Take 1 tablet by mouth daily.    Marland Kitchen omeprazole (PRILOSEC) 40 MG capsule Take 40 mg by mouth daily as needed (heart burn).     Glory Rosebush VERIO test strip 1 each by Other route in the morning, at noon, and at bedtime.     Derrill Memo ON 12/11/2019] polyethylene glycol (MIRALAX / GLYCOLAX) 17 g packet Take 17 g by mouth daily. 14 each 0  . potassium chloride (KLOR-CON) 10 MEQ tablet Take 10 mEq by mouth at bedtime.    Marland Kitchen PREVIDENT 5000 BOOSTER PLUS 1.1 % PSTE Place 1 application onto teeth in the morning and at  bedtime.     . sertraline (ZOLOFT) 100 MG tablet Take 100 mg by mouth daily.      Drug Regimen Review  Drug regimen was reviewed and remains appropriate with no significant issues identified  Home: Home Living Family/patient expects to be discharged to:: Private residence Living Arrangements: Alone Available Help at Discharge: Family, Other (Comment) Type of Home: House Home Access: Stairs to enter [employer] of Steps: 10 from parking spot to door Entrance Stairs-Rails: Left Home Layout: Two level, Bed/bath upstairs, 1/2 bath on main level Alternate Level Stairs-Number of Steps: 17 with landing Alternate Level Stairs-Rails: Right Bathroom Shower/Tub: Tub/shower unit, Multimedia programmer: Handicapped height Bathroom Accessibility: Yes Home Equipment: [place] - single point   Functional History: Prior Function Level of Independence: Needs assistance Gait / Transfers Assistance Needed: pt navigating stairs with slow step-to gait pattern with L foot leading with use of rails. reports no use of AD at home, use of driver and then [facility] for Dr visits ADL's / Homemaking Assistance Needed: pt reports home  care service that does general home maintenance and cleaning, pt reports he does his own cooking adn ADL  Functional Status:  Mobility: Bed Mobility Overal bed mobility: Modified Independent Bed Mobility: Supine to Sit Rolling: Min assist Sidelying to sit: Min guard Supine to sit: Min guard Sit to supine: Max assist General bed mobility comments: increased time and use of rail Transfers Overall transfer level: Needs assistance Equipment used: 1 person hand held assist Transfer via Lift Equipment: Stedy Transfers: Sit to/from Stand Sit to Stand: Min assist Squat pivot transfers: Total assist  Lateral/Scoot Transfers: Mod assist General transfer comment: assist to power up into standing and then to gain balance in standing without use of AD; HHA upon standing    Ambulation/Gait Ambulation/Gait assistance: Min assist, Mod assist Gait Distance (Feet): 100 Feet Assistive device: 1 person hand held assist(assist at trunk with gait belt) Gait Pattern/deviations: Decreased stride length, Step-through pattern, Decreased dorsiflexion - left General Gait Details: assist for balance; decreased bilat step lengths and very guarded movement Gait velocity: decreased    ADL: ADL Overall ADL's : Needs assistance/impaired Eating/Feeding: Minimal assistance Grooming: Minimal assistance, Bed level Upper Body Bathing: Moderate assistance, Bed level Lower Body Bathing: Maximal assistance, Bed level Upper Body Dressing : Maximal assistance, Sitting Lower Body Dressing: Sitting/lateral leans, Min guard Lower Body Dressing Details (indicate cue type and reason): pt able to don sock on L with increased time using figure 4 position, able to demonstrate figure 4 on R as well Toilet Transfer: (will need lift equipment) Toileting- Clothing Manipulation and Hygiene: Total assistance Toileting - Clothing Manipulation Details (indicate cue type and reason): declined to try male urinal; only wanting condom cath Functional mobility during ADLs: Minimal assistance General ADL Comments: min A to complete LB dressing, session focusing on fine motor coordination of LUE  Cognition: Cognition Overall Cognitive Status: No family/caregiver present to determine baseline cognitive functioning Arousal/Alertness: Awake/alert Orientation Level: Oriented X4 Attention: Focused, Sustained Focused Attention: Appears intact Sustained Attention: Appears intact Memory: Appears intact(Immediate: 5/5; delayed: 5/5) Awareness: Appears intact Problem Solving: Appears intact Executive Function: Reasoning, Sequencing Reasoning: (Abstract: 2/2) Sequencing: Appears intact Cognition Arousal/Alertness: Awake/alert Behavior During Therapy: WFL for tasks assessed/performed Overall Cognitive  Status: No family/caregiver present to determine baseline cognitive functioning Area of Impairment: Problem solving Current Attention Level: Selective Safety/Judgement: Decreased awareness of safety, Decreased awareness of deficits Awareness: Emergent Problem Solving: Slow processing, Difficulty sequencing General Comments: Pt with improved engagement and motivation today   Blood pressure 125/63, pulse 69, temperature 98.7 F (37.1 C), temperature source Oral, resp. rate 20, height 6\' 2"  (1.88 m), weight 123.6 kg, SpO2 94 %. Physical Exam  Nursing note and vitals reviewed. Constitutional: He is oriented to person, place, and time. He appears well-developed.  Obese  HENT:  Head: Normocephalic and atraumatic.  Eyes: EOM are normal. Right eye exhibits no discharge. Left eye exhibits no discharge.  Neck: No tracheal deviation present. No thyromegaly present.  Cardiovascular: An irregular rhythm present.  Respiratory: Effort normal. No respiratory distress.  GI: Soft. He exhibits no distension. There is no abdominal tenderness.  Musculoskeletal:        General: Edema present.     Comments: Lower extremity edema  Neurological: He is alert and oriented to person, place, and time.  Motor: Right upper extremity/right lower extremity: 5/5 proximal distal Left upper extremity: 4+/5 proximal and distal Left lower extremity: 4+/5 proximal distal Left upper extremity with mild ataxia  Skin:  Scattered bruising  Psychiatric: He has a  normal mood and affect. His behavior is normal. Thought content normal.    Results for orders placed or performed during the hospital encounter of 12/03/19 (from the past 48 hour(s))  Glucose, capillary     Status: Abnormal   Collection Time: 12/08/19  4:37 PM  Result Value Ref Range   Glucose-Capillary 136 (H) 70 - 99 mg/dL    Comment: Glucose reference range applies only to samples taken after fasting for at least 8 hours.   Comment 1 Notify RN    Comment 2  Document in Chart   CBC with Differential/Platelet     Status: Abnormal   Collection Time: 12/08/19  5:01 PM  Result Value Ref Range   WBC 10.5 4.0 - 10.5 K/uL   RBC 3.68 (L) 4.22 - 5.81 MIL/uL   Hemoglobin 9.9 (L) 13.0 - 17.0 g/dL   HCT 42.7 (L) 06.2 - 37.6 %   MCV 88.0 80.0 - 100.0 fL   MCH 26.9 26.0 - 34.0 pg   MCHC 30.6 30.0 - 36.0 g/dL   RDW 28.3 15.1 - 76.1 %   Platelets 347 150 - 400 K/uL   nRBC 0.0 0.0 - 0.2 %   Neutrophils Relative % 59 %   Neutro Abs 6.2 1.7 - 7.7 K/uL   Lymphocytes Relative 30 %   Lymphs Abs 3.1 0.7 - 4.0 K/uL   Monocytes Relative 7 %   Monocytes Absolute 0.8 0.1 - 1.0 K/uL   Eosinophils Relative 3 %   Eosinophils Absolute 0.3 0.0 - 0.5 K/uL   Basophils Relative 0 %   Basophils Absolute 0.0 0.0 - 0.1 K/uL   Immature Granulocytes 1 %   Abs Immature Granulocytes 0.06 0.00 - 0.07 K/uL    Comment: Performed at [facility], [address], [place], [place] [address]  Comprehensive metabolic panel     Status: Abnormal   Collection Time: 12/08/19  5:01 PM  Result Value Ref Range   Sodium 139 135 - 145 mmol/L   Potassium 3.7 3.5 - 5.1 mmol/L   Chloride 104 98 - 111 mmol/L   CO2 27 22 - 32 mmol/L   Glucose, Bld 144 (H) 70 - 99 mg/dL    Comment: Glucose reference range applies only to samples taken after fasting for at least 8 hours.   BUN 13 8 - 23 mg/dL   Creatinine, Ser 1.06 0.61 - 1.24 mg/dL   Calcium 8.7 (L) 8.9 - 10.3 mg/dL   Total Protein 6.6 6.5 - 8.1 g/dL   Albumin 2.5 (L) 3.5 - 5.0 g/dL   AST 18 15 - 41 U/L   ALT 20 0 - 44 U/L   Alkaline Phosphatase 75 38 - 126 U/L   Total Bilirubin 0.7 0.3 - 1.2 mg/dL   GFR calc non Af Amer >60 >60 mL/min   GFR calc Af Amer >60 >60 mL/min   Anion gap 8 5 - 15    Comment: Performed at [facility], [address], [place], [place] [address]  Glucose, capillary     Status: None   Collection Time: 12/08/19  9:14 PM  Result Value Ref Range   Glucose-Capillary 76 70 - 99 mg/dL    Comment:  Glucose reference range applies only to samples taken after fasting for at least 8 hours.  Basic metabolic panel     Status: Abnormal   Collection Time: 12/09/19  4:59 AM  Result Value Ref Range   Sodium 142 135 - 145 mmol/L  Potassium 3.7 3.5 - 5.1 mmol/L   Chloride 107 98 - 111 mmol/L   CO2 29 22 - 32 mmol/L   Glucose, Bld 95 70 - 99 mg/dL    Comment: Glucose reference range applies only to samples taken after fasting for at least 8 hours.   BUN 11 8 - 23 mg/dL   Creatinine, Ser 1.17 0.61 - 1.24 mg/dL   Calcium 8.8 (L) 8.9 - 10.3 mg/dL   GFR calc non Af Amer >60 >60 mL/min   GFR calc Af Amer >60 >60 mL/min   Anion gap 6 5 - 15    Comment: Performed at [facility] [address], [place], [place] [address]  CBC     Status: Abnormal   Collection Time: 12/09/19  4:59 AM  Result Value Ref Range   WBC 9.7 4.0 - 10.5 K/uL   RBC 3.35 (L) 4.22 - 5.81 MIL/uL   Hemoglobin 9.0 (L) 13.0 - 17.0 g/dL   HCT 29.7 (L) 39.0 - 52.0 %   MCV 88.7 80.0 - 100.0 fL   MCH 26.9 26.0 - 34.0 pg   MCHC 30.3 30.0 - 36.0 g/dL   RDW 14.9 11.5 - 15.5 %   Platelets 284 150 - 400 K/uL   nRBC 0.0 0.0 - 0.2 %    Comment: Performed at [facility], [place] [address] [place], [place] [address]  Glucose, capillary     Status: None   Collection Time: 12/09/19  6:05 AM  Result Value Ref Range   Glucose-Capillary 83 70 - 99 mg/dL    Comment: Glucose reference range applies only to samples taken after fasting for at least 8 hours.  Glucose, capillary     Status: Abnormal   Collection Time: 12/09/19 10:45 AM  Result Value Ref Range   Glucose-Capillary 146 (H) 70 - 99 mg/dL    Comment: Glucose reference range applies only to samples taken after fasting for at least 8 hours.   Comment 1 Notify RN    Comment 2 Document in Chart   Glucose, capillary     Status: Abnormal   Collection Time: 12/09/19  4:26 PM  Result Value Ref Range   Glucose-Capillary 101 (H) 70 - 99 mg/dL    Comment: Glucose reference  range applies only to samples taken after fasting for at least 8 hours.   Comment 1 Notify RN    Comment 2 Document in Chart   Glucose, capillary     Status: Abnormal   Collection Time: 12/09/19  8:39 PM  Result Value Ref Range   Glucose-Capillary 112 (H) 70 - 99 mg/dL    Comment: Glucose reference range applies only to samples taken after fasting for at least 8 hours.  Basic metabolic panel     Status: None   Collection Time: 12/10/19  4:33 AM  Result Value Ref Range   Sodium 143 135 - 145 mmol/L   Potassium 3.8 3.5 - 5.1 mmol/L   Chloride 106 98 - 111 mmol/L   CO2 28 22 - 32 mmol/L   Glucose, Bld 77 70 - 99 mg/dL    Comment: Glucose reference range applies only to samples taken after fasting for at least 8 hours.   BUN 11 8 - 23 mg/dL   Creatinine, Ser 0.96 0.61 - 1.24 mg/dL   Calcium 9.0 8.9 - 10.3 mg/dL   GFR calc non Af Amer >60 >60 mL/min   GFR calc Af Amer >60 >60 mL/min   Anion  gap 9 5 - 15    Comment: Performed at [facility], [address] [place], [place] [address]  CBC     Status: Abnormal   Collection Time: 12/10/19  4:33 AM  Result Value Ref Range   WBC 8.8 4.0 - 10.5 K/uL   RBC 3.45 (L) 4.22 - 5.81 MIL/uL   Hemoglobin 9.1 (L) 13.0 - 17.0 g/dL   HCT 12.7 (L) 51.7 - 00.1 %   MCV 89.6 80.0 - 100.0 fL   MCH 26.4 26.0 - 34.0 pg   MCHC 29.4 (L) 30.0 - 36.0 g/dL   RDW 74.9 44.9 - 67.5 %   Platelets 293 150 - 400 K/uL   nRBC 0.0 0.0 - 0.2 %    Comment: Performed at [facility], [address] [place], [place] [address]  Glucose, capillary     Status: None   Collection Time: 12/10/19  6:26 AM  Result Value Ref Range   Glucose-Capillary 84 70 - 99 mg/dL    Comment: Glucose reference range applies only to samples taken after fasting for at least 8 hours.  Glucose, capillary     Status: Abnormal   Collection Time: 12/10/19 11:41 AM  Result Value Ref Range   Glucose-Capillary 109 (H) 70 - 99 mg/dL    Comment: Glucose reference range applies only to  samples taken after fasting for at least 8 hours.   Comment 1 Notify RN    Comment 2 Document in Chart    No results found.     Medical Problem List and Plan: 1.  Left hemiparesis, deficits with endurance, and self-care secondary to acute right basal ganglia/CR infarct.  -patient may shower  -ELOS/Goals: 8-12 days/supervision/mod I  Admit to CIR 2.  Antithrombotics: -DVT/anticoagulation:  Pharmaceutical: Other (comment)Eliquis   -antiplatelet therapy: Low dose ASA 3. Pain Management: Tylenol prn effective for total pain.   Monitor with increased ambulation 4. Mood: LCSW to follow for evaluation and support.   -antipsychotic agents: NA 5. Neuropsych: This patient is capable of making decisions on his own behalf. 6. Skin/Wound Care: Routine pressure relief measures.  7. Fluids/Electrolytes/Nutrition: Monitor I/Os.  CMP ordered. 8. HTN: Monitor BP tid--continue to hold lasix and metoprolol for now.   Monitor with increased mobility. 9. T2DM with hyperglycemia: Intake has been good--will continue to monitor BS ac/hs. Continue to titrate 70/30 insulin--used 20 units bid depending on BS (per Dr. Talmage Nap)  Monitor with increased mobility. 10. PAF: Monitor heart rate tid--continue amiodarone. Off Toprol at this time.   Monitor with increased exertion 11. H/o depression: Mood stable on Zoloft. 12. Constipation: Continue senna S bid.   Adjust bowel meds as necessary. 13. GERD: Continue PPI.  14. H/o asthma: Resume Breo.   Jacquelynn Cree, PA-C 12/10/2019  I have personally performed a face to face diagnostic evaluation, including, but not limited to relevant history and physical exam findings, of this patient and developed relevant assessment and plan.  Additionally, I have reviewed and concur with the physician assistant's documentation above.  Maryla Morrow, MD, ABPMR  The patient's status has not changed. The original post admission physician evaluation remains appropriate, and any  changes from the pre-admission screening or documentation from the acute chart are noted above.   Maryla Morrow, MD, ABPMR

## 2019-12-10 NOTE — Progress Notes (Signed)
Jamse Arn, MD  Physician  Physical Medicine and Rehabilitation  PMR Pre-admission     Addendum  Date of Service:  12/07/2019  9:03 AM      Related encounter: ED to Hosp-Admission (Discharged) from 12/03/2019 in [facility]        Show:Clear all _0 Manual_1 Template_2 Copied  Added by: _3 Cristina Gong, RN_4 Raechel Ache, OT  _5 Hover for details PMR Admission Coordinator Pre-Admission Assessment   Patient: Brandon Sanchez is an 74 y.o., male MRN: [id number] DOB: Feb 17, 1946 Height: _6  (188 cm) Weight: 123.6 kg                                                                                                                                                  Insurance Information HMO:     PPO:      PCP:      IPA:      80/20: yes     OTHER:  PRIMARY: Medicare Part A and B       Policy#:[id number]       Subscriber: patient CM Name:       Phone#:      Fax#:  Pre-Cert#:       Employer:  Benefits:  Phone #: online     Name: verified eligibility online via [place] on 12/08/19 Eff. Date: part A effective 12/18/11, Part B effective 05/19/12     Deduct: $1,484      Out of Pocket Max: NA      Life Max: NA  CIR: Covered per Medicare guidelines once yearly deductible has been met      SNF: days 1-20, 100%; days 21-100, 80% Outpatient: 80%     Co-Pay: 20% Home Health: 100%      Co-Pay:  DME: 80%     Co-Pay: 20% Providers: Pt's choice SECONDARY: BCBS      Policy#: [id number]      Phone#: [phone]   Financial Counselor:      Phone#:    The "Data Collection Information Summary" for patients in Inpatient Rehabilitation Facilities with attached "Privacy Act [place] Records" was provided and verbally reviewed with: Patient   Emergency Contact Information Contact Information     Name Relation Home Work Mobile    Kyle L Daughter [phone]   [phone]       Current Medical History  Patient Admitting Diagnosis:  Left sided  weakness and facial droop secondary to 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   History of Present Illness: Pt is a 55 you male with history of DM type 2, paroxysmal atrial fibrillation, hyperlipidemia, and hypertension who presented to [facility] ED on 12/03/19 with left-sided weakness and left facial droop. TPA was not administered due to timeline. 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 Pt with uncontrolled type 2 DM and was hypoglycemic on arrival requiring dextrose. Pt's BP has been controlled on lasix and his heart rate controlled and in sinus rhythm. Pt has had some mildly elevated creatine at 1.29, with suggestion to monitor intermittently.    Complete NIHSS TOTAL: 0 Glasgow Coma Scale Score: 15   Past Medical History      Past Medical History:  Diagnosis Date  . Allergic rhinitis, cause unspecified    . Asthma    . Cataract cortical, senile, bilateral    . Macular degeneration of left eye    . Other and unspecified hyperlipidemia    . Type II or unspecified type diabetes mellitus without mention of complication, not stated as uncontrolled    . Unspecified essential hypertension        Family History  family history includes Colon cancer in his father; Heart disease in his paternal grandfather.   Prior Rehab/Hospitalizations:  Has the patient had prior rehab or hospitalizations prior to admission? Yes   Has the patient had major surgery during 100 days prior to admission? Yes   Current Medications    Current Facility-Administered Medications:  .  acetaminophen (TYLENOL) tablet 650 mg, 650 mg, Oral, Q4H PRN, 650 mg at 12/09/19 2146 **OR** acetaminophen (TYLENOL) 160 MG/5ML solution 650 mg, 650 mg, Per Tube, Q4H PRN **OR** acetaminophen (TYLENOL) suppository 650 mg, 650 mg, Rectal, Q4H PRN, Shalhoub, Sherryll Burger, MD .  albuterol (PROVENTIL) (2.5 MG/3ML) 0.083%  nebulizer solution 3 mL, 3 mL, Inhalation, Q4H PRN, Shalhoub, Sherryll Burger, MD .  amiodarone (PACERONE) tablet 200 mg, 200 mg, Oral, Daily, Shalhoub, Sherryll Burger, MD, 200 mg at 12/10/19 1022 .  apixaban (ELIQUIS) tablet 5 mg, 5 mg, Oral, BID, Shalhoub, Sherryll Burger, MD, 5 mg at 12/10/19 1023 .  aspirin EC tablet 81 mg, 81 mg, Oral, Daily, Rosalin Hawking, MD, 81 mg at 12/10/19 1022 .  atorvastatin (LIPITOR) tablet 80 mg, 80 mg, Oral, Daily, Shalhoub, Sherryll Burger, MD, 80 mg at 12/10/19 1023 .  fluticasone furoate-vilanterol (BREO ELLIPTA) 200-25 MCG/INH 1 puff, 1 puff, Inhalation, Daily PRN, Shalhoub, Sherryll Burger, MD .  insulin aspart (novoLOG) injection 0-15 Units, 0-15 Units, Subcutaneous, TID AC & HS, Shalhoub, Sherryll Burger, MD, 2 Units at 12/09/19 1109 .  insulin aspart protamine- aspart (NOVOLOG MIX 70/30) injection 16 Units, 16 Units, Subcutaneous, BID WC, Lavina Hamman, MD, 16 Units at 12/10/19 1023 .  methocarbamol (ROBAXIN) tablet 500 mg, 500 mg, Oral, Q8H PRN, Lavina Hamman, MD, 500 mg at 12/08/19 2005 .  ondansetron (ZOFRAN) injection 4 mg, 4 mg, Intravenous, Q6H PRN, Shalhoub, Sherryll Burger, MD .  pantoprazole (PROTONIX) EC tablet 40 mg, 40 mg, Oral, Daily, Shalhoub, Sherryll Burger, MD, 40 mg at 12/10/19 1022 .  polyethylene glycol (MIRALAX / GLYCOLAX) packet 17 g, 17 g, Oral, Daily, Lavina Hamman, MD, 17 g at 12/08/19 1206 .  senna-docusate (Senokot-S) tablet 1 tablet, 1 tablet, Oral, BID, Lavina Hamman, MD, 1 tablet at 12/10/19 1022 .  sertraline (ZOLOFT) tablet 100 mg, 100 mg, Oral, Daily, Shalhoub, Sherryll Burger, MD, 100 mg at 12/10/19 1023   Patients Current Diet:     Diet Order                      Diet heart healthy/carb modified Room service appropriate? Yes; Fluid consistency: Thin  Diet effective now  Precautions / Restrictions Precautions Precautions: Fall Precaution Comments: L hemiplegia Restrictions Weight Bearing Restrictions: No    Has the patient had 2 or more falls or a  fall with injury in the past year?No   Prior Activity Level Community (5-7x/wk): not working or driving but did do his own IADLs, grocerty shopping   Prior Functional Level Prior Function Level of Independence: Needs assistance Gait / Transfers Assistance Needed: pt navigating stairs with slow step-to gait pattern with L foot leading with use of rails. reports no use of AD at home, use of driver and then [facility] for Dr visits ADL's / Homemaking Assistance Needed: pt reports home care service that does general home maintenance and cleaning, pt reports he does his own cooking adn ADL   Self Care: Did the patient need help bathing, dressing, using the toilet or eating?  Independent   Indoor Mobility: Did the patient need assistance with walking from room to room (with or without device)? Independent   Stairs: Did the patient need assistance with internal or external stairs (with or without device)? Independent   Functional Cognition: Did the patient need help planning regular tasks such as shopping or remembering to take medications? Independent   Home Assistive Devices / Equipment Home Assistive Devices/Equipment: Cane (specify quad or straight) Home Equipment: Cane - single point   Prior Device Use: Indicate devices/aids used by the patient prior to current illness, exacerbation or injury? None of the above   Current Functional Level Cognition   Arousal/Alertness: Awake/alert Overall Cognitive Status: No family/caregiver present to determine baseline cognitive functioning Current Attention Level: Selective Orientation Level: Oriented X4 Safety/Judgement: Decreased awareness of safety, Decreased awareness of deficits General Comments: Pt with improved engagement and motivation today Attention: Focused, Sustained Focused Attention: Appears intact Sustained Attention: Appears intact Memory: Appears intact(Immediate: 5/5; delayed: 5/5) Awareness: Appears intact Problem Solving: Appears  intact Executive Function: Reasoning, Sequencing Reasoning: (Abstract: 2/2) Sequencing: Appears intact    Extremity Assessment (includes Sensation/Coordination)   Upper Extremity Assessment: Defer to OT evaluation LUE Deficits / Details: unable to lift off bed or move up abdomen toward mouth; gross grasp adn slow release lacking @ 30 degrees full extension. unable to use as functional assist LUE Sensation: decreased light touch LUE Coordination: decreased fine motor, decreased gross motor  Lower Extremity Assessment: LLE deficits/detail, RLE deficits/detail RLE Deficits / Details: generally 4-/5, especially in knee and hip strength.pt with poor functional use for mobility in bed LLE Deficits / Details: generally 3-/5, able to initiate all movements through partial ROM LLE Sensation: decreased light touch LLE Coordination: decreased fine motor, decreased gross motor     ADLs   Overall ADL's : Needs assistance/impaired Eating/Feeding: Minimal assistance Grooming: Minimal assistance, Bed level Upper Body Bathing: Moderate assistance, Bed level Lower Body Bathing: Maximal assistance, Bed level Upper Body Dressing : Maximal assistance, Sitting Lower Body Dressing: Sitting/lateral leans, Min guard Lower Body Dressing Details (indicate cue type and reason): pt able to don sock on L with increased time using figure 4 position, able to demonstrate figure 4 on R as well Toilet Transfer: (will need lift equipment) Toileting- Clothing Manipulation and Hygiene: Total assistance Toileting - Clothing Manipulation Details (indicate cue type and reason): declined to try male urinal; only wanting condom cath Functional mobility during ADLs: Minimal assistance General ADL Comments: min A to complete LB dressing, session focusing on fine motor coordination of LUE     Mobility   Overal bed mobility: Modified Independent Bed Mobility: Supine to Sit  Rolling: Min assist Sidelying to sit: Min  guard Supine to sit: Min guard Sit to supine: Max assist General bed mobility comments: increased time and use of rail     Transfers   Overall transfer level: Needs assistance Equipment used: 1 person hand held assist Transfer via Lift Equipment: Stedy Transfers: Sit to/from Stand Sit to Stand: Min assist Squat pivot transfers: Total assist  Lateral/Scoot Transfers: Mod assist General transfer comment: assist to power up into standing and then to gain balance in standing without use of AD; HHA upon standing      Ambulation / Gait / Stairs / Wheelchair Mobility   Ambulation/Gait Ambulation/Gait assistance: Min assist, Mod assist Gait Distance (Feet): 100 Feet Assistive device: 1 person hand held assist(assist at trunk with gait belt) Gait Pattern/deviations: Decreased stride length, Step-through pattern, Decreased dorsiflexion - left General Gait Details: assist for balance; decreased bilat step lengths and very guarded movement Gait velocity: decreased     Posture / Balance Dynamic Sitting Balance Sitting balance - Comments: supervision at EOB Balance Overall balance assessment: Needs assistance Sitting-balance support: Feet supported Sitting balance-Leahy Scale: Good Sitting balance - Comments: supervision at EOB Postural control: Posterior lean, Left lateral lean Standing balance support: During functional activity, Single extremity supported Standing balance-Leahy Scale: Poor Standing balance comment: reliant on BUE support and external support High Level Balance Comments: Pt practiced multiple reaching balance tasks while standing in stedy at sink with mirror for visual feedback and bilateral reaching  for improved wt shifting bilaterally and standing balance.     Special needs/care consideration Skin MASD to bilateral sacrum, Diabetic management: yes and Designated visitor are Carmell [place] and [place] (from acute therapy documentation) Living  Arrangements: Alone Available Help at Discharge: Family, Other (Comment) Type of Home: House Home Layout: Two level, Bed/bath upstairs, 1/2 bath on main level Alternate Level Stairs-Rails: Right Alternate Level Stairs-Number of Steps: 17 with landing Home Access: Stairs to enter Entrance Stairs-Rails: Left Entrance Stairs-Number of Steps: 10 from parking spot to door Bathroom Shower/Tub: Tub/shower unit, Multimedia programmer: Handicapped height Bathroom Accessibility: Yes How Accessible: Accessible via walker [place]: No   Discharge Living Setting Plans for Discharge Living Setting: Patient's home, Other (Comment)(will have assist from [place] and care agency) Type of Home at Discharge: House(townhouse) Discharge Home Layout: Two level, Bed/bath upstairs(1/2 bath downstairs) Alternate Level Stairs-Rails: Right Alternate Level Stairs-Number of Steps: 8 then landing, then 8 Discharge Home Access: Stairs to enter Entrance Stairs-Rails: Left Entrance Stairs-Number of Steps: 4-5 steps Discharge Bathroom Shower/Tub: Walk-in shower Discharge Bathroom Toilet: Standard Discharge Bathroom Accessibility: Yes How Accessible: Accessible via walker Does the patient have any problems obtaining your medications?: No   Social/Family/Support Systems Patient Roles: Other (Comment)(has daughter in [place] who he is close with) Contact Information: daughter Carmell [place]): [phone] Anticipated Caregiver: daughter + [facility] care agency Anticipated Caregiver's Contact Information: see above Ability/Limitations of Caregiver: Min A Caregiver Availability: 24/7 Discharge Plan Discussed with Primary Caregiver: Yes(pt and daughter) Is Caregiver In Agreement with Plan?: Yes Does Caregiver/Family have Issues with Lodging/Transportation while Pt is in Rehab?: No     Goals Patient/Family Goal for Rehab: PT: Mod I; OT: Mod I/Supervision; SLP: NA Expected length of stay: 7-10 days    Cultural Considerations: NA Pt/Family Agrees to Admission and willing to participate: Yes Program Orientation Provided & Reviewed with Pt/Caregiver Including Roles  & Responsibilities: Yes(pt and his daughter)  Barriers to Discharge: Home environment access/layout  Barriers to Discharge Comments: stairs to enter home; bed/bath upstairs. daughter lives in [place] but plans to arrange 24/7 A.      Decrease burden of Care through IP rehab admission: NA     Possible need for SNF placement upon discharge: Not anticipated. Pt has good support from his daughter who plans to stay with him at DC with transition to [facility] care agency for assistance as needed. Anticipate pt can reach a Supervision/Min A level at DC which can be managed by his daughter.      Patient Condition: This patient's medical and functional status has changed since the consult dated: 12/06/19 in which the Rehabilitation Physician determined and documented that the patient's condition is appropriate for intensive rehabilitative care in an inpatient rehabilitation facility. See "History of Present Illness" (above) for medical update. Functional changes are: improvement in transfers from Mod A +2 to Min G/Min A, improvement in gait distance and assistance from Mod A +2 for 4 feet to Min G for 50 feet, and improvement in overall ADL completion from Min A to Total A to Min G to Min A. Patient's medical and functional status update has been discussed with the Rehabilitation physician and patient remains appropriate for inpatient rehabilitation. Will admit to inpatient rehab today.   Preadmission Screen Completed By: Raechel Ache with updates by  Cleatrice Burke, RN, 12/10/2019 11:04 AM ______________________________________________________________________   Discussed status with Dr. Posey Pronto on 4/23/2021at  1104 and received approval for admission today.   Admission Coordinator:Kelly Gentry with updates by   Cleatrice Burke,  time 1051 Date 12/10/2019         Revision History

## 2019-12-10 NOTE — Discharge Summary (Signed)
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 PCP: please follow up with PCP in 1 week 2. Follow up LABS/TEST:  none  Follow-up Information    [facility]. Schedule an appointment as soon as possible for a visit in 4 week(s).   Specialty: Neurology Contact information: [address] [place] [address] [phone]       Merri Brunette, MD. Schedule an appointment as soon as possible for a visit in 1 week(s).   Specialty: Internal Medicine Contact information: [address] [place] [address] [phone]          Diet recommendation: Cardiac diet  Activity: The patient is advised to gradually reintroduce usual activities, as tolerated  Discharge Condition: stable  Code Status: Full code   History of present illness: As per the H and P dictated on admission, "74 year old male with past medical history of diabetes mellitus type 2, paroxysmal atrial fibrillation, hyperlipidemia, hypertension who presents to [facility] emergency department with left-sided weakness and left facial droop.  Patient explains that at approximately 8:00 this morning began to notice that he was having  difficulty with ambulation.  Patient did not pay much mind.  As the day progressed, he continued to have difficulty with ambulation.  By the early afternoon, the patient noticed that he began to develop severe weakness of the left side and difficulty with moving his left lower extremity in any capacity.  More, he felt that his left face was "tingling" and that "I could not speak right."  Patient denies any associated chest pain, palpitations, lightheadedness, confusion, changes in vision.  At 6 PM, the patient was concerned that he may be having a stroke and therefore contacted family who then contacted 911.  Patient was brought into [facility] emergency department via EMS and was deemed a code stroke on arrival.  Shortly after arrival to the emergency department, patient was quickly valuated by neurology.  Furthermore, patient underwent initial imaging of the brain including noncontrast CT imaging as well as CT angiogram of the head and neck.  Due to timeline of symptoms and patient already being on Eliquis TPA was not administered.  During this initial evaluation, patient's left-sided weakness began to rapidly improve spontaneously with neurology suspecting that the patient symptoms are more likely secondary to TIA.  The hospitalist group was then called to assess patient for mission to the hospital. "  Hospital Course:  Summary of his active problems in the hospital is as following.  Small acute posterior right basal ganglia/corona radiata infarct: With left-sided weakness, improving slowly, seen by neurology plan is to continue aspirin, Eliquis  PT OT recommended CIR placement.   Patient completed stroke work-up with MRI-right BG/CR is small infarcts, MRA head normal, no emergent large vessel occlusion on CTA head and neck-  see findings below.  With EF 55 to 60% 2, hemoglobin A1c at 7.6.  Uncontrolled type 2 diabetes mellitus with hypoglycemia without coma, with long-term current use of  insulin:  Hemoglobin A1c 7.6, blood sugar controlled on home regimen of NovoLog. Was  hypoglycemic on arrival needing dextrose.  Left foot spasm  on Robaxin PRN  Essential hypertension:  BP well controlled on Lasix.   Metoprolol on hold-and discontinued due to hypotension and fluctuating neuro symptoms,  Lasix dose has been cut down to 40.   Avoid low blood pressure.  Paroxysmal atrial fibrillation: Continue Eliquis, amiodarone.  Rate is controlled and in sinus rhythm.  Hyperlipidemia on Lipitor.  obesity with BMI 34: He will benefit with weight loss and healthy lifestyle.  Mildly elevated creatinine 1.29.  Monitor intermittently  Anemia likely from chronic disease.  Hemoglobin is stable.  Body mass index is 34.99 kg/m.   Patient was seen by physical therapy, who recommended CIR, which was arranged. On the day of the discharge the patient's vitals were stable, and no other acute medical condition were reported by patient. the patient was felt safe to be discharge at [facility] with therapy.  Consultants: neurology, IP rehab Procedures: none  Discharge Exam: General: Appear in no distress, no Rash; Oral Mucosa Clear, moist. Cardiovascular: S1 and S2 Present, no Murmur, Respiratory: normal respiratory effort, Bilateral Air entry present and no Crackles, no wheezes Abdomen: Bowel Sound present, Soft and no tenderness, no hernia Extremities: no Pedal edema, no calf tenderness Neurology: alert and oriented to time, place, and person affect appropriate.  Filed Weights   12/03/19 2142 12/04/19 0400  Weight: 125.2 kg 123.6 kg   Vitals:   12/10/19 0353 12/10/19 0845  BP: (!) 142/67 122/62  Pulse: 61 73  Resp: 18 18  Temp: 98 F (36.7 C) 97.8 F (36.6 C)  SpO2: 97% 93%    DISCHARGE MEDICATION: Allergies as of 12/10/2019   No Known Allergies     Medication List    STOP taking these medications   metoprolol succinate 50 MG 24 hr tablet Commonly known as: TOPROL-XL       TAKE these medications   acetaminophen 500 MG tablet Commonly known as: TYLENOL Take 1,000 mg by mouth every 6 (six) hours as needed for moderate pain or headache.   albuterol 108 (90 Base) MCG/ACT inhaler Commonly known as: ProAir HFA 2 puffs every 4 hours as needed only  if your can't catch your breath What changed:   how much to take  how to take this  when to take this  reasons to take this  additional instructions   amiodarone 200 MG tablet Commonly known as: PACERONE Take 1 tablet (200 mg total) by mouth daily.   apixaban 5 MG Tabs tablet Commonly known as: ELIQUIS Take 1 tablet (5 mg total) by mouth 2 (two) times daily.   aspirin 81 MG EC tablet Take 1 tablet (81 mg total) by mouth daily. Start taking on: December 11, 2019   atorvastatin 40 MG tablet Commonly known as: LIPITOR Take 40 mg by mouth every evening.   Breo Ellipta 200-25 MCG/INH Aepb Generic drug: fluticasone furoate-vilanterol Inhale 1 puff into the lungs daily as needed (shortness of breath).   furosemide 40 MG tablet Commonly known as: LASIX Take 1 tablet (40 mg total) by mouth daily. May take additional 40 mg if notice swelling of shortness of breath What changed: how much to take   Glucosamine Chondr 1500 Complx Caps Take 1 capsule  by mouth 2 (two) times daily.   insulin aspart protamine- aspart (70-30) 100 UNIT/ML injection Commonly known as: NOVOLOG MIX 70/30 Inject 0.16 mLs (16 Units total) into the skin 2 (two) times daily at 8 am and 10 pm. Dose varies - Prescription is for 20 units with each meal (twice daily). However administration depends on what types of meals being taken. What changed: how much to take   multivitamin with minerals Tabs tablet Take 1 tablet by mouth daily.   omeprazole 40 MG capsule Commonly known as: PRILOSEC Take 40 mg by mouth daily as needed (heart burn).   OneTouch Verio test strip Generic drug: glucose blood 1 each by Other route in the morning, at  noon, and at bedtime.   polyethylene glycol 17 g packet Commonly known as: MIRALAX / GLYCOLAX Take 17 g by mouth daily. Start taking on: December 11, 2019   potassium chloride 10 MEQ tablet Commonly known as: KLOR-CON Take 10 mEq by mouth at bedtime.   PreviDent 5000 Booster Plus 1.1 % Pste Generic drug: Sodium Fluoride Place 1 application onto teeth in the morning and at bedtime.   sertraline 100 MG tablet Commonly known as: ZOLOFT Take 100 mg by mouth daily.      No Known Allergies Discharge Instructions    Ambulatory referral to Neurology   Complete by: As directed    Follow up with stroke clinic NP (Jessica Vanschaick or Darrol Angel, if both not available, consider Manson Allan, or Ahern) at [facility] in about 4 weeks. Thanks.   Diet - low sodium heart healthy   Complete by: As directed    Increase activity slowly   Complete by: As directed       The results of significant diagnostics from this hospitalization (including imaging, microbiology, ancillary and laboratory) are listed below for reference.    Significant Diagnostic Studies: CT Code Stroke CTA Head W/WO contrast  Result Date: 12/03/2019 CLINICAL DATA:  Left-sided weakness. EXAM: CT ANGIOGRAPHY HEAD AND NECK TECHNIQUE: Multidetector CT imaging of the head and neck was performed using the standard protocol during bolus administration of intravenous contrast. Multiplanar CT image reconstructions and MIPs were obtained to evaluate the vascular anatomy. Carotid stenosis measurements (when applicable) are obtained utilizing NASCET criteria, using the distal internal carotid diameter as the denominator. CONTRAST:  66mL OMNIPAQUE IOHEXOL 350 MG/ML SOLN COMPARISON:  CT head without contrast 12/03/2019 FINDINGS: CTA NECK FINDINGS Aortic arch: A 3 vessel arch configuration is present. Atherosclerotic calcifications are present without aneurysm or stenosis. Right carotid system: Atherosclerotic calcifications are present the wall  of distal right common carotid artery without significant stenosis. Atherosclerotic calcifications are present at the bifurcation. Mixed density plaque is noted laterally without significant stenosis. Minimal luminal diameter is 2.5 mm. Left carotid system: The left common carotid artery also demonstrates some distal mural calcifications. Calcifications are present at the bifurcation without significant stenosis. Distal cervical left ICA is normal. Vertebral arteries: The vertebral arteries are codominant. High-grade stenosis is present at the origin of the left vertebral artery. A 50% narrowing is present the origin of the right vertebral artery. No additional stenoses are present in either vertebral artery in the neck. Skeleton: Acquired fusion is present across the disc space at C3-4 fusion is also noted across the disc space at C6-7. Vertebral body heights and alignment are maintained. No focal lytic or blastic lesions are present. Other neck: Soft tissues of the neck are unremarkable. Upper chest: Prominent left pleural effusion is present. Associated atelectasis is  present. Right lung is clear. Subcentimeter mediastinal nodes are likely reactive. Review of the MIP images confirms the above findings CTA HEAD FINDINGS Anterior circulation: Atherosclerotic calcifications are present supraclinoid internal carotid arteries bilaterally without a significant stenosis relative to the ICA termini. The A1 and M1 segments are normal. MCA bifurcations intact. The ACA and MCA branch vessels are within normal limits. Posterior circulation: Left vertebral artery is dominant. PICA origins are visualized and. The vertebrobasilar junction is within normal limits. Basilar artery is. Both posterior cerebral arteries originate basilar tip. A prominent right posterior communicating artery contributes. Venous sinuses: The dural sinuses are patent. The straight sinus deep cerebral veins intact. Cortical veins are unremarkable.  Anatomic variants: Prominent right posterior communicating artery. Review of the MIP images confirms the above findings IMPRESSION: 1. No emergent large vessel occlusion 2. Atherosclerotic changes in the distal common carotid arteries and at the carotid bifurcations bilaterally significant stenosis. 3.  Aortic Atherosclerosis (ICD10-I70.0). 4. Degenerative changes of the cervical spine including fusion at C3-4 and C6-7. These results were called by telephone at the time of interpretation on 12/03/2019 at 9:35pm to provider ERIC [facility] , who verbally acknowledged these results. Electronically Signed   By: Marin Roberts M.D.   On: 12/03/2019 21:41   CT Code Stroke CTA Neck W/WO contrast  Result Date: 12/03/2019 CLINICAL DATA:  Left-sided weakness. EXAM: CT ANGIOGRAPHY HEAD AND NECK TECHNIQUE: Multidetector CT imaging of the head and neck was performed using the standard protocol during bolus administration of intravenous contrast. Multiplanar CT image reconstructions and MIPs were obtained to evaluate the vascular anatomy. Carotid stenosis measurements (when applicable) are obtained utilizing NASCET criteria, using the distal internal carotid diameter as the denominator. CONTRAST:  75mL OMNIPAQUE IOHEXOL 350 MG/ML SOLN COMPARISON:  CT head without contrast 12/03/2019 FINDINGS: CTA NECK FINDINGS Aortic arch: A 3 vessel arch configuration is present. Atherosclerotic calcifications are present without aneurysm or stenosis. Right carotid system: Atherosclerotic calcifications are present the wall of distal right common carotid artery without significant stenosis. Atherosclerotic calcifications are present at the bifurcation. Mixed density plaque is noted laterally without significant stenosis. Minimal luminal diameter is 2.5 mm. Left carotid system: The left common carotid artery also demonstrates some distal mural calcifications. Calcifications are present at the bifurcation without significant stenosis.  Distal cervical left ICA is normal. Vertebral arteries: The vertebral arteries are codominant. High-grade stenosis is present at the origin of the left vertebral artery. A 50% narrowing is present the origin of the right vertebral artery. No additional stenoses are present in either vertebral artery in the neck. Skeleton: Acquired fusion is present across the disc space at C3-4 fusion is also noted across the disc space at C6-7. Vertebral body heights and alignment are maintained. No focal lytic or blastic lesions are present. Other neck: Soft tissues of the neck are unremarkable. Upper chest: Prominent left pleural effusion is present. Associated atelectasis is present. Right lung is clear. Subcentimeter mediastinal nodes are likely reactive. Review of the MIP images confirms the above findings CTA HEAD FINDINGS Anterior circulation: Atherosclerotic calcifications are present supraclinoid internal carotid arteries bilaterally without a significant stenosis relative to the ICA termini. The A1 and M1 segments are normal. MCA bifurcations intact. The ACA and MCA branch vessels are within normal limits. Posterior circulation: Left vertebral artery is dominant. PICA origins are visualized and. The vertebrobasilar junction is within normal limits. Basilar artery is. Both posterior cerebral arteries originate basilar tip. A prominent right posterior communicating artery contributes. Venous sinuses: The dural  sinuses are patent. The straight sinus deep cerebral veins intact. Cortical veins are unremarkable. Anatomic variants: Prominent right posterior communicating artery. Review of the MIP images confirms the above findings IMPRESSION: 1. No emergent large vessel occlusion 2. Atherosclerotic changes in the distal common carotid arteries and at the carotid bifurcations bilaterally significant stenosis. 3.  Aortic Atherosclerosis (ICD10-I70.0). 4. Degenerative changes of the cervical spine including fusion at C3-4 and C6-7.  These results were called by telephone at the time of interpretation on 12/03/2019 at 9:35pm to provider ERIC [facility] , who verbally acknowledged these results. Electronically Signed   By: San Morelle M.D.   On: 12/03/2019 21:41   MR ANGIO HEAD WO CONTRAST  Result Date: 12/04/2019 CLINICAL DATA:  TIA. Left-sided weakness and facial droop. EXAM: MRI HEAD WITHOUT CONTRAST MRA HEAD WITHOUT CONTRAST TECHNIQUE: Multiplanar, multiecho pulse sequences of the brain and surrounding structures were obtained without intravenous contrast. Angiographic images of the head were obtained using MRA technique without contrast. COMPARISON:  Head CT and CTA 12/03/2019 FINDINGS: MRI HEAD FINDINGS Brain: There is a small acute infarct involving the right corona radiata and posterior right lentiform nucleus. T2 hyperintensities in the cerebral white matter bilaterally are nonspecific but compatible with mild chronic small vessel ischemic disease. Mild cerebral atrophy is within normal limits for age. No intracranial hemorrhage, mass, midline shift, or extra-axial fluid collection is identified. Vascular: Major intracranial vascular flow voids are preserved. Skull and upper cervical spine: Decreased bone marrow T1 signal intensity in the skull and included upper cervical spine, nonspecific but may be related to the patient's known anemia. Sinuses/Orbits: Unremarkable orbits. Mild mucosal thickening in the paranasal sinuses. Trace left mastoid fluid. Other: None. MRA HEAD FINDINGS The visualized distal vertebral arteries are patent to the basilar with a fenestration partially visualized in the right vertebral artery, a normal variant. The left PICA and right AICA appear dominant. Patent SCAs are seen bilaterally. The basilar artery is widely patent. There is a moderately large right posterior communicating artery. Both PCAs are patent without evidence of significant proximal stenosis. The internal carotid arteries are widely  patent from skull base to carotid termini. ACAs and MCAs are patent without evidence of proximal branch occlusion or significant proximal stenosis. No aneurysm is identified. IMPRESSION: 1. 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 2. Mild chronic small vessel ischemic disease. 3. Negative head MRA. Electronically Signed   By: Logan Bores M.D.   On: 12/04/2019 09:37   MR BRAIN WO CONTRAST  Result Date: 12/04/2019 CLINICAL DATA:  TIA. Left-sided weakness and facial droop. EXAM: MRI HEAD WITHOUT CONTRAST MRA HEAD WITHOUT CONTRAST TECHNIQUE: Multiplanar, multiecho pulse sequences of the brain and surrounding structures were obtained without intravenous contrast. Angiographic images of the head were obtained using MRA technique without contrast. COMPARISON:  Head CT and CTA 12/03/2019 FINDINGS: MRI HEAD FINDINGS Brain: There is a small acute infarct involving the right corona radiata and posterior right lentiform nucleus. T2 hyperintensities in the cerebral white matter bilaterally are nonspecific but compatible with mild chronic small vessel ischemic disease. Mild cerebral atrophy is within normal limits for age. No intracranial hemorrhage, mass, midline shift, or extra-axial fluid collection is identified. Vascular: Major intracranial vascular flow voids are preserved. Skull and upper cervical spine: Decreased bone marrow T1 signal intensity in the skull and included upper cervical spine, nonspecific but may be related to the patient's known anemia. Sinuses/Orbits: Unremarkable orbits. Mild mucosal thickening in the paranasal sinuses. Trace left mastoid fluid. Other: None. MRA  HEAD FINDINGS The visualized distal vertebral arteries are patent to the basilar with a fenestration partially visualized in the right vertebral artery, a normal variant. The left PICA and right AICA appear dominant. Patent SCAs are seen bilaterally. The basilar artery is widely patent. There is a moderately large  right posterior communicating artery. Both PCAs are patent without evidence of significant proximal stenosis. The internal carotid arteries are widely patent from skull base to carotid termini. ACAs and MCAs are patent without evidence of proximal branch occlusion or significant proximal stenosis. No aneurysm is identified. IMPRESSION: 1. 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 2. Mild chronic small vessel ischemic disease. 3. Negative head MRA. Electronically Signed   By: Sebastian Ache M.D.   On: 12/04/2019 09:37   DG CHEST PORT 1 VIEW  Result Date: 12/04/2019 CLINICAL DATA:  Left-sided weakness and facial droop EXAM: PORTABLE CHEST 1 VIEW COMPARISON:  09/19/2019 FINDINGS: Single frontal view of the chest was performed. Cardiac silhouette is stable. There is veiling opacity at the left lung base, new since prior study. No pneumothorax. No acute bony abnormalities. IMPRESSION: 1. Left basilar veiling opacity consistent with consolidation and/or effusion. Electronically Signed   By: Sharlet Salina M.D.   On: 12/04/2019 19:13   ECHOCARDIOGRAM COMPLETE  Result Date: 12/04/2019    ECHOCARDIOGRAM REPORT   Patient Name:   Brandon Sanchez Date of Exam: 12/04/2019 Medical Rec #:  [id number]        Height:       74.0 in Accession #:    [id number]       Weight:       272.5 lb Date of Birth:  1945-11-16        BSA:          2.479 m Patient Age:    73 years         BP:           104/79 mmHg Patient Gender: M                HR:           70 bpm. Exam Location:  Inpatient Procedure: 2D Echo, Cardiac Doppler and Color Doppler Indications:    TIA 435.9 / G45.9  History:        Patient has prior history of Echocardiogram examinations, most                 recent 09/20/2019. Risk Factors:Hypertension, Diabetes and                 Dyslipidemia. Cerebral embolism with cerebral                 infarction,Paroxysmal atrial fibrillation,Obesity.  Sonographer:    Celesta Gentile RCS Referring Phys: [id number]  Deno Lunger [facility] IMPRESSIONS  1. Left ventricular ejection fraction, by estimation, is 55 to 60%. The left ventricle has normal function. The left ventricle has no regional wall motion abnormalities. The left ventricular internal cavity size was mildly dilated. There is mild concentric left ventricular hypertrophy. Left ventricular diastolic parameters are consistent with Grade III diastolic dysfunction (restrictive). Elevated left ventricular end-diastolic pressure.  2. Right ventricular systolic function is normal. The right ventricular size is mildly enlarged.  3. Left atrial size was mildly dilated.  4. Right atrial size was mild to moderately dilated.  5. The mitral valve is grossly normal. Trivial mitral valve regurgitation. No evidence of mitral stenosis.  6. The aortic valve is tricuspid. Aortic valve  regurgitation is mild to moderate. Moderate aortic valve stenosis.  7. The inferior vena cava is dilated in size with >50% respiratory variability, suggesting right atrial pressure of 8 mmHg. Conclusion(s)/Recommendation(s): Elevated LVEDP, restrictive diastolic filling, moderate aortic stenosis with mild-moderate aortic regurgitation. FINDINGS  Left Ventricle: Left ventricular ejection fraction, by estimation, is 55 to 60%. The left ventricle has normal function. The left ventricle has no regional wall motion abnormalities. The left ventricular internal cavity size was mildly dilated. There is  mild concentric left ventricular hypertrophy. Left ventricular diastolic parameters are consistent with Grade III diastolic dysfunction (restrictive). Elevated left ventricular end-diastolic pressure. Right Ventricle: The right ventricular size is mildly enlarged. No increase in right ventricular wall thickness. Right ventricular systolic function is normal. Left Atrium: Left atrial size was mildly dilated. Right Atrium: Right atrial size was mild to moderately dilated. Pericardium: Trivial pericardial effusion is  present. Presence of pericardial fat pad. Mitral Valve: The mitral valve is grossly normal. Trivial mitral valve regurgitation. No evidence of mitral valve stenosis. Tricuspid Valve: The tricuspid valve is grossly normal. Tricuspid valve regurgitation is trivial. No evidence of tricuspid stenosis. Aortic Valve: The aortic valve is tricuspid. . There is moderate thickening and moderate calcification of the aortic valve. Aortic valve regurgitation is mild to moderate. Aortic regurgitation PHT measures 476 msec. Moderate aortic stenosis is present. There is moderate thickening of the aortic valve. There is moderate calcification of the aortic valve. Aortic valve mean gradient measures 30.5 mmHg. Aortic valve peak gradient measures 48.0 mmHg. Aortic valve area, by VTI measures 1.09 cm. Pulmonic Valve: The pulmonic valve was not well visualized. Pulmonic valve regurgitation is not visualized. No evidence of pulmonic stenosis. Aorta: The aortic root and ascending aorta are structurally normal, with no evidence of dilitation. Venous: The inferior vena cava is dilated in size with greater than 50% respiratory variability, suggesting right atrial pressure of 8 mmHg. IAS/Shunts: The atrial septum is grossly normal. Additional Comments: There is a small pleural effusion.  LEFT VENTRICLE PLAX 2D LVIDd:         5.50 cm  Diastology LVIDs:         3.61 cm  LV e' lateral:   7.18 cm/s LV PW:         1.20 cm  LV E/e' lateral: 21.6 LV IVS:        1.20 cm  LV e' medial:    5.77 cm/s LVOT diam:     1.90 cm  LV E/e' medial:  26.9 LV SV:         100 LV SV Index:   40 LVOT Area:     2.84 cm  RIGHT VENTRICLE RV S prime:     13.50 cm/s TAPSE (M-mode): 1.9 cm LEFT ATRIUM             Index       RIGHT ATRIUM           Index LA diam:        4.50 cm 1.82 cm/m  RA Area:     31.00 cm LA Vol (A2C):   95.4 ml 38.49 ml/m RA Volume:   115.00 ml 46.39 ml/m LA Vol (A4C):   72.0 ml 29.05 ml/m LA Biplane Vol: 83.0 ml 33.48 ml/m  AORTIC VALVE AV  Area (Vmax):    1.13 cm AV Area (Vmean):   1.01 cm AV Area (VTI):     1.09 cm AV Vmax:           346.50  cm/s AV Vmean:          261.000 cm/s AV VTI:            0.915 m AV Peak Grad:      48.0 mmHg AV Mean Grad:      30.5 mmHg LVOT Vmax:         138.00 cm/s LVOT Vmean:        92.800 cm/s LVOT VTI:          0.352 m LVOT/AV VTI ratio: 0.38 AI PHT:            476 msec  AORTA Ao Root diam: 3.30 cm MITRAL VALVE MV Area (PHT): 2.97 cm     SHUNTS MV Decel Time: 256 msec     Systemic VTI:  0.35 m MV E velocity: 155.00 cm/s  Systemic Diam: 1.90 cm MV A velocity: 94.60 cm/s MV E/A ratio:  1.64 Jodelle Red MD Electronically signed by Jodelle Red MD Signature Date/Time: 12/04/2019/4:02:44 PM    Final    CT HEAD CODE STROKE WO CONTRAST  Result Date: 12/03/2019 CLINICAL DATA:  Code stroke.  Facial droop and left-sided weakness. EXAM: CT HEAD WITHOUT CONTRAST TECHNIQUE: Contiguous axial images were obtained from the base of the skull through the vertex without intravenous contrast. COMPARISON:  None. FINDINGS: Brain: Mild generalized atrophy and white matter disease is present. No acute infarct, hemorrhage, or mass lesion is present. Basal ganglia are intact. Insular ribbon is normal bilaterally. No acute or focal cortical abnormality is present. The ventricles are of normal size. No significant extraaxial fluid collection is present. The brainstem and cerebellum are within normal limits. Vascular: Atherosclerotic changes are present within the cavernous internal carotid arteries. No hyperdense vessel is present. Skull: Calvarium is intact. No focal lytic or blastic lesions are present. No significant extracranial soft tissue lesion is present. Sinuses/Orbits: Mucosal scratched at circumferential mucosal thickening is present in the maxillary sinuses, left greater than right. No fluid levels are present. Posterior left ethmoid air cells opacified. Mucosal thickening is present in the inferior frontal  sinuses bilaterally. Mastoid air cells are clear. Globes and orbits are within normal limits. ASPECTS [facility] Stroke Program Early CT Score) - Ganglionic level infarction (caudate, lentiform nuclei, internal capsule, insula, M1-M3 cortex): 7/7 - Supraganglionic infarction (M4-M6 cortex): 3/3 Total score (0-10 with 10 being normal): 10/10 IMPRESSION: 1. No acute intracranial abnormality. 2. Mild generalized atrophy and white matter disease. This likely reflects the sequela of chronic microvascular ischemia. 3. ASPECTS is 10/10. 4. Mild sinus disease. The above was relayed via text pager to Dr. Otelia Limes on 12/03/2019 at 21:20 . Electronically Signed   By: Marin Roberts M.D.   On: 12/03/2019 21:20   SLEEP STUDY DOCUMENTS  Result Date: 11/26/2019 Ordered by an unspecified provider.   Microbiology: Recent Results (from the past 240 hour(s))  SARS CORONAVIRUS 2 (TAT 6-24 HRS) Nasopharyngeal Nasopharyngeal Swab     Status: None   Collection Time: 12/04/19  1:00 AM   Specimen: Nasopharyngeal Swab  Result Value Ref Range Status   SARS Coronavirus 2 NEGATIVE NEGATIVE Final    Comment: (NOTE) SARS-CoV-2 target nucleic acids are NOT DETECTED. The SARS-CoV-2 RNA is generally detectable in upper and lower respiratory specimens during the acute phase of infection. Negative results do not preclude SARS-CoV-2 infection, do not rule out co-infections with other pathogens, and should not be used as the sole basis for treatment or other patient management decisions. Negative results must be combined with clinical observations, patient  history, and epidemiological information. The expected result is Negative. Fact Sheet for Patients: HairSlick.nohttps://www.fda.gov/media/138098/download Fact Sheet for Healthcare Providers: quierodirigir.comhttps://www.fda.gov/media/138095/download This test is not yet approved or cleared by the [place] FDA and  has been authorized for detection and/or diagnosis of SARS-CoV-2 by FDA under an  Emergency Use Authorization (EUA). This EUA will remain  in effect (meaning this test can be used) for the duration of the COVID-19 declaration under Section 56 4(b)(1) of the Act, 21 U.S.C. section 360bbb-3(b)(1), unless the authorization is terminated or revoked sooner. Performed at [facility], [address] [place], [place] [address]      Labs: CBC: Recent Labs  Lab 12/03/19 2112 12/03/19 2112 12/05/19 0400 12/06/19 0411 12/08/19 1701 12/09/19 0459 12/10/19 0433  WBC 13.8*   < > 10.0 10.1 10.5 9.7 8.8  NEUTROABS 8.2*  --   --   --  6.2  --   --   HGB 10.9*   < > 10.0* 9.6* 9.9* 9.0* 9.1*  HCT 36.3*   < > 33.1* 31.2* 32.4* 29.7* 30.9*  MCV 89.4   < > 88.7 88.4 88.0 88.7 89.6  PLT 393   < > 350 354 347 284 293   < > = values in this interval not displayed.   Basic Metabolic Panel: Recent Labs  Lab 12/05/19 0400 12/06/19 0411 12/08/19 1701 12/09/19 0459 12/10/19 0433  NA 143 144 139 142 143  K 3.3* 3.7 3.7 3.7 3.8  CL 105 104 104 107 106  CO2 30 29 27 29 28   GLUCOSE 78 75 144* 95 77  BUN 16 16 13 11 11   CREATININE 1.12 1.29* 1.15 1.17 0.96  CALCIUM 9.0 9.0 8.7* 8.8* 9.0  MG 2.1 2.1  --   --   --   PHOS 3.9 4.2  --   --   --    Liver Function Tests: Recent Labs  Lab 12/03/19 2112 12/08/19 1701  AST 41 18  ALT 39 20  ALKPHOS 78 75  BILITOT 0.5 0.7  PROT 7.2 6.6  ALBUMIN 2.8* 2.5*   No results for input(s): LIPASE, AMYLASE in the last 168 hours. No results for input(s): AMMONIA in the last 168 hours. Cardiac Enzymes: No results for input(s): CKTOTAL, CKMB, CKMBINDEX, TROPONINI in the last 168 hours. BNP (last 3 results) Recent Labs    09/20/19 0515  BNP 289.1*   CBG: Recent Labs  Lab 12/09/19 0605 12/09/19 1045 12/09/19 1626 12/09/19 2039 12/10/19 0626  GLUCAP 83 146* 101* 112* 84    Time spent: 35 minutes  Signed:  Lynden OxfordPranav Domino Holten  [facility]  12/10/2019 11:33 AM

## 2019-12-11 ENCOUNTER — Inpatient Hospital Stay (HOSPITAL_COMMUNITY): Payer: Federal, State, Local not specified - PPO | Admitting: Occupational Therapy

## 2019-12-11 ENCOUNTER — Inpatient Hospital Stay (HOSPITAL_COMMUNITY): Payer: Federal, State, Local not specified - PPO | Admitting: Physical Therapy

## 2019-12-11 DIAGNOSIS — I6310 Cerebral infarction due to embolism of unspecified precerebral artery: Secondary | ICD-10-CM

## 2019-12-11 LAB — CBC WITH DIFFERENTIAL/PLATELET
Abs Immature Granulocytes: 0.07 10*3/uL (ref 0.00–0.07)
Basophils Absolute: 0 10*3/uL (ref 0.0–0.1)
Basophils Relative: 0 %
Eosinophils Absolute: 0.4 10*3/uL (ref 0.0–0.5)
Eosinophils Relative: 4 %
HCT: 29.9 % — ABNORMAL LOW (ref 39.0–52.0)
Hemoglobin: 9.1 g/dL — ABNORMAL LOW (ref 13.0–17.0)
Immature Granulocytes: 1 %
Lymphocytes Relative: 34 %
Lymphs Abs: 3.3 10*3/uL (ref 0.7–4.0)
MCH: 27.1 pg (ref 26.0–34.0)
MCHC: 30.4 g/dL (ref 30.0–36.0)
MCV: 89 fL (ref 80.0–100.0)
Monocytes Absolute: 0.8 10*3/uL (ref 0.1–1.0)
Monocytes Relative: 8 %
Neutro Abs: 5.2 10*3/uL (ref 1.7–7.7)
Neutrophils Relative %: 53 %
Platelets: 301 10*3/uL (ref 150–400)
RBC: 3.36 MIL/uL — ABNORMAL LOW (ref 4.22–5.81)
RDW: 15 % (ref 11.5–15.5)
WBC: 9.7 10*3/uL (ref 4.0–10.5)
nRBC: 0 % (ref 0.0–0.2)

## 2019-12-11 LAB — COMPREHENSIVE METABOLIC PANEL
ALT: 16 U/L (ref 0–44)
AST: 14 U/L — ABNORMAL LOW (ref 15–41)
Albumin: 2.2 g/dL — ABNORMAL LOW (ref 3.5–5.0)
Alkaline Phosphatase: 64 U/L (ref 38–126)
Anion gap: 8 (ref 5–15)
BUN: 10 mg/dL (ref 8–23)
CO2: 29 mmol/L (ref 22–32)
Calcium: 8.9 mg/dL (ref 8.9–10.3)
Chloride: 106 mmol/L (ref 98–111)
Creatinine, Ser: 1.04 mg/dL (ref 0.61–1.24)
GFR calc Af Amer: >60 mL/min (ref >60)
GFR calc non Af Amer: >60 mL/min (ref >60)
Glucose, Bld: 82 mg/dL (ref 70–99)
Potassium: 3.3 mmol/L — ABNORMAL LOW (ref 3.5–5.1)
Sodium: 143 mmol/L (ref 135–145)
Total Bilirubin: 0.3 mg/dL (ref 0.3–1.2)
Total Protein: 5.9 g/dL — ABNORMAL LOW (ref 6.5–8.1)

## 2019-12-11 LAB — GLUCOSE, CAPILLARY
Glucose-Capillary: 79 mg/dL (ref 70–99)
Glucose-Capillary: 133 mg/dL — ABNORMAL HIGH (ref 70–99)
Glucose-Capillary: 125 mg/dL — ABNORMAL HIGH (ref 70–99)
Glucose-Capillary: 92 mg/dL (ref 70–99)

## 2019-12-11 NOTE — Progress Notes (Signed)
[facility] PHYSICAL MEDICINE & REHABILITATION PROGRESS NOTE   Subjective/Complaints:  No issues overnight.  Questions about chronic lower extremity swelling.  He has been seen by cardiology for this as well as primary care.  Echocardiogram showed normal ejection fraction  Review of systems negative for chest pain shortness of breath nausea vomiting diarrhea constipation  Objective:   No results found. Recent Labs    12/10/19 0433 12/11/19 0501  WBC 8.8 9.7  HGB 9.1* 9.1*  HCT 30.9* 29.9*  PLT 293 301   Recent Labs    12/10/19 0433 12/11/19 0501  NA 143 143  K 3.8 3.3*  CL 106 106  CO2 28 29  GLUCOSE 77 82  BUN 11 10  CREATININE 0.96 1.04  CALCIUM 9.0 8.9    Intake/Output Summary (Last 24 hours) at 12/11/2019 1340 Last data filed at 12/11/2019 2778 Gross per 24 hour  Intake 120 ml  Output 1050 ml  Net -930 ml     Physical Exam: Vital Signs Blood pressure 126/61, pulse 67, temperature 98.8 F (37.1 C), temperature source Oral, resp. rate 16, height 6\' 2"  (1.88 m), weight 129.7 kg, SpO2 96 %.  General: No acute distress Mood and affect are appropriate Heart: Regular rate and rhythm no rubs murmurs or extra sounds Lungs: Clear to auscultation, breathing unlabored, no rales or wheezes Abdomen: Positive bowel sounds, soft nontender to palpation, nondistended Extremities: No clubbing, cyanosis Skin: No evidence of breakdown, no evidence of rash Neurologic: Cranial nerves II through XII intact, motor strength is 5/5 in bilateral deltoid, bicep, tricep, grip, hip flexor, knee extensors, ankle dorsiflexor and plantar flexor Sensory exam normal sensation to light touch in bilateral upper and lower extremities Cerebellar exam normal finger to nose to finger as well as heel to shin in bilateral upper and lower extremities Musculoskeletal: Full range of motion in all 4 extremities. No joint swelling Trace pedal edema    Assessment/Plan: 1. Functional deficits  secondary to right basal ganglia infarct which require 3+ hours per day of interdisciplinary therapy in a comprehensive inpatient rehab setting.  Physiatrist is providing close team supervision and 24 hour management of active medical problems listed below.  Physiatrist and rehab team continue to assess barriers to discharge/monitor patient progress toward functional and medical goals  Care Tool:  Bathing    Body parts bathed by patient: Right arm, Left arm, Chest, Abdomen, Front perineal area, Buttocks, Right upper leg, Left upper leg, Face   Body parts bathed by helper: Right lower leg, Left lower leg     Bathing assist Assist Level: Minimal Assistance - Patient > 75%     Upper Body Dressing/Undressing Upper body dressing   What is the patient wearing?: Pull over shirt    Upper body assist Assist Level: Set up assist    Lower Body Dressing/Undressing Lower body dressing      What is the patient wearing?: Underwear/pull up, Pants     Lower body assist Assist for lower body dressing: Contact Guard/Touching assist     Toileting Toileting Toileting Activity did not occur (Clothing management and hygiene only): N/A (no void or bm)(condom cath in place, no BM)  Toileting assist Assist for toileting: Contact Guard/Touching assist     Transfers Chair/bed transfer  Transfers assist     Chair/bed transfer assist level: Contact Guard/Touching assist     Locomotion Ambulation   Ambulation assist              Walk 10 feet activity  Assist           Walk 50 feet activity   Assist           Walk 150 feet activity   Assist           Walk 10 feet on uneven surface  activity   Assist           Wheelchair     Assist               Wheelchair 50 feet with 2 turns activity    Assist            Wheelchair 150 feet activity     Assist          Blood pressure 126/61, pulse 67, temperature 98.8 F (37.1 C),  temperature source Oral, resp. rate 16, height 6\' 2"  (1.88 m), weight 129.7 kg, SpO2 96 %.  Medical Problem List and Plan: 1.  Left hemiparesis, deficits with endurance, and self-care secondary to acute right basal ganglia/CR infarct.             -patient may shower             -ELOS/Goals: 8-12 days/supervision/mod I            PT OT eval's 2.  Antithrombotics: -DVT/anticoagulation:  Pharmaceutical: Other (comment)Eliquis              -antiplatelet therapy: Low dose ASA 3. Pain Management: Tylenol prn effective for total pain.              Monitor with increased ambulation 4. Mood: LCSW to follow for evaluation and support.              -antipsychotic agents: NA 5. Neuropsych: This patient is capable of making decisions on his own behalf. 6. Skin/Wound Care: Routine pressure relief measures.  7. Fluids/Electrolytes/Nutrition: Monitor I/Os.  CMP ordered. 8. HTN: Monitor BP tid--continue to hold lasix and metoprolol for now.         Vitals:   12/10/19 2141 12/11/19 0509  BP:  126/61  Pulse:  67  Resp:  16  Temp:  98.8 F (37.1 C)  SpO2: 93% 96%   9. T2DM with hyperglycemia: Intake has been good--will continue to monitor BS ac/hs. Continue to titrate 70/30 insulin--used 20 units bid depending on BS (per Dr. Chalmers Cater)            CBG (last 3)  Recent Labs    12/10/19 2120 12/11/19 0627 12/11/19 1151  GLUCAP 181* 79 133*   10. PAF: Monitor heart rate tid--continue amiodarone. Off Toprol at this time.              Monitor with increased exertion 11. H/o depression: Mood stable on Zoloft. 12. Constipation: Continue senna S bid.              Adjust bowel meds as necessary. 13. GERD: Continue PPI.  11. H/o asthma: Resume Breo.    LOS: 1 days A FACE TO FACE EVALUATION WAS PERFORMED  Charlett Blake 12/11/2019, 1:40 PM

## 2019-12-11 NOTE — Evaluation (Signed)
Physical Therapy Assessment and Plan  Patient Details  Name: Brandon Sanchez MRN: [id number] Date of Birth: February 26, 1946  PT Diagnosis: Coordination disorder, Hemiparesis non-dominant and Muscle weakness Rehab Potential: Good ELOS: 7 days   Today's Date: 12/11/2019 PT Individual Time: 1300-1408 PT Individual Time Calculation (min): 68 min    Problem List:  Patient Active Problem List   Diagnosis Date Noted  . Basal ganglia stroke ([place]) 12/10/2019  . CHF (congestive heart failure) ([place])   . Left-sided weakness   . Slow transit constipation   . Pain in toes of both feet   . Cerebral embolism with cerebral infarction 12/04/2019  . CVA (cerebral vascular accident) ([place]) 12/04/2019  . Acute left-sided weakness 12/03/2019  . Mixed diabetic hyperlipidemia associated with type 2 diabetes mellitus ([place]) 12/03/2019  . Paroxysmal atrial fibrillation ([facility])   . Secondary hypercoagulable state ([place]) 09/28/2019  . Atrial fibrillation with rapid ventricular response ([place]) 09/20/2019  . CAP (community acquired pneumonia) 09/20/2019  . Obesity 05/12/2015  . Uncontrolled type 2 diabetes mellitus with hypoglycemia without coma, with long-term current use of insulin ([place]) 02/04/2008  . HLD (hyperlipidemia) 02/04/2008  . Essential hypertension 02/04/2008  . ALLERGIC RHINITIS 02/04/2008  . 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 02/72/5366    Past Medical History:  Past Medical History:  Diagnosis Date  . Allergic rhinitis, cause unspecified   . Asthma   . Cataract cortical, senile, bilateral   . Macular degeneration of left eye   . Other and unspecified hyperlipidemia   . Type II or unspecified type diabetes mellitus without mention of complication, not stated as uncontrolled   . Unspecified essential hypertension    Past Surgical History:  Past Surgical History:  Procedure Laterality Date  . CARDIOVERSION N/A 09/22/2019   Procedure: CARDIOVERSION;  Surgeon: Donato Heinz, MD;  Location[facility] END[facility]COPY;  Service: Endoscopy;  Laterality: N/A;  . CARDIOVERSION N/A 10/18/2019   Procedure: CARDIOVERSION;  Surgeon: Jerline Pain, MD;  Location: [facility] ENDOSCOPY;  Service: Cardiovascular;  Laterality: N/A;  . TEE WITHOUT CARDIOVERSION N/A 09/22/2019   Procedure: TRANSESOPHAGEAL ECHOCARDIOGRAM (TEE);  Surgeon: Donato Heinz, MD;  Location: [facility] ENDOSCOPY;  Service: Endoscopy;  Laterality: N/A;    Assessment & Plan Clinical Impression: Patient is a 74 year old RH male with history of T2DM, HTN, cataracts--blurry vision/needs surgery, alcohol abuse--quit 1/21, recent diagnosis of A. Fib--on Eliquis who was admitted on 12/03/2019 with left hemiparesis, facial droop and dysarthria.  History taken from chart review, patient, and attending physician.  CT head unremarkable for acute intracranial process.  CTA head/neck was negative for LVO, atherosclerotic changes distal CCA and carotid bifurcation, 50% stenosis left-VA and showed degenerative changes of cervical spine.  MRI brain personally reviewed, showing small right brain infarct.  Per report, MRI/MRI brain, acute right basal ganglia/CR infarct.  Echocardiogram with ejection fraction of 55 to 60% with moderate aortic stenosis and mild concentric LVH with grade 3 DD.  Dr. Erlinda Hong felt the stroke was due to large vessel disease-- likely right CCA mixed plaque and low-dose ASA added to Eliquis.  Lipitor increased to 80 mg.  Patient has had fluctuating left-sided symptoms due to hypotension therefore metoprolol and Lasix have been discontinued.  He has had some increasing bipedal edema with reports of pain and numbness.  Discussed patient's recent complaint of toe pain and discharge with attending physician.  Patient continues to be limited by left-sided weakness affecting ADLs and mobility.  CIR recommended for follow-up therapy.   Patient  transferred to CIR on 12/10/2019 .   Patient currently requires min with mobility  secondary to muscle weakness, decreased cardiorespiratoy endurance and decreased coordination.  Prior to hospitalization, patient was independent  with mobility and lived with Alone in a House(townhome) home.  Home access is 10 from parking spot to doorStairs to enter.  Patient will benefit from skilled PT intervention to maximize safe functional mobility, minimize fall risk and decrease caregiver burden for planned discharge home with intermittent assist.  Anticipate patient will benefit from follow up [facility] at discharge.  PT - End of Session Activity Tolerance: Tolerates 30+ min activity with multiple rests PT Assessment Rehab Potential (ACUTE/IP ONLY): Good PT Barriers to Discharge: Inaccessible home environment;Decreased caregiver support PT Patient demonstrates impairments in the following area(s): Balance;Motor PT Transfers Functional Problem(s): Bed Mobility;Bed to Chair;Car PT Locomotion Functional Problem(s): Ambulation;Stairs PT Plan PT Intensity: Minimum of 1-2 x/day ,45 to 90 minutes PT Frequency: 5 out of 7 days PT Duration Estimated Length of Stay: 7 days PT Treatment/Interventions: Ambulation/gait training;Functional mobility training;Therapeutic Activities;Visual/perceptual remediation/compensation;Balance/vestibular training;Neuromuscular re-education;Therapeutic Exercise;UE/LE Strength taining/ROM;UE/LE Coordination activities;Stair training;Patient/family education PT Transfers Anticipated Outcome(s): mod I transfers PT Locomotion Anticipated Outcome(s): mod I gait and stairs PT Recommendation Follow Up Recommendations: Home health PT Patient destination: Home  Skilled Therapeutic Intervention PT evaluation completed and treatment plan initiated. Pt performed multiple transfers with c/g and rolling walker. Pt ambulated 200 feet and 175 x 2 with rolling walker and c/g with verbal cues. Pt returned to room and left sitting up in bed with all needs within reach. Pt educated on PT  plan of care and interventions. Pt verbalized understanding.   PT Evaluation Precautions/Restrictions Precautions Precautions: Fall Precaution Comments: L hemiparesis Restrictions Weight Bearing Restrictions: No General Chart Reviewed: Yes Family/Caregiver Present: No  Pain Pain Assessment Pain Score: 0-No pain Home Living/Prior Functioning Home Living Available Help at Discharge: Family;Other (Comment) Type of Home: House(townhome) Home Access: Stairs to enter Entrance Stairs-Number of Steps: 10 from parking spot to door Entrance Stairs-Rails: Left Home Layout: Two level;1/2 bath on main level;Bed/bath upstairs Alternate Level Stairs-Number of Steps: 17 with landing Alternate Level Stairs-Rails: Right Bathroom Shower/Tub: Walk-in shower  Lives With: Alone Prior Function Level of Independence: Independent with transfers;Independent with gait  Able to Take Stairs?: Yes Driving: No 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 Praxis Praxis: Intact  Cognition Overall Cognitive Status: Within Functional Limits for tasks assessed Arousal/Alertness: Awake/alert Orientation Level: Oriented X4 Memory: Appears intact Awareness: Appears intact Problem Solving: Appears intact Safety/Judgment: Appears intact Sensation Sensation Light Touch: Appears Intact Proprioception: Appears Intact Coordination Gross Motor Movements are Fluid and Coordinated: Yes Motor  Motor Motor - Skilled Clinical Observations: L hemiparesis  Mobility Bed Mobility Bed Mobility: Supine to Sit;Sit to Supine Supine to Sit: Supervision/Verbal cueing Sit to Supine: Supervision/Verbal cueing Transfers Transfers: Sit to Stand;Stand to Sit;Stand Pivot Transfers Sit to Stand: Contact Guard/Touching assist Stand to Sit: Contact Guard/Touching assist Stand Pivot Transfers: Contact Guard/Touching assist Transfer (Assistive device): Rolling walker Locomotion   Gait Ambulation: Yes Gait Assistance: Contact Guard/Touching assist Gait Distance (Feet): 200 Feet Assistive device: Rolling walker Stairs / Additional Locomotion Stairs: Yes Stairs Assistance: Contact Guard/Touching assist Stair Management Technique: Two rails Number of Stairs: 12 Height of Stairs: 6 Wheelchair Mobility Wheelchair Mobility: No  Trunk/Postural Assessment  Cervical Assessment Cervical Assessment: Within Functional Limits Thoracic Assessment Thoracic Assessment: Within Functional Limits Lumbar Assessment Lumbar Assessment: Within Functional Limits Postural Control Postural Control: Within Functional Limits  Balance Balance  Balance Assessed: Yes Static Sitting Balance Static Sitting - Balance Support: Feet supported Static Sitting - Level of Assistance: 5: Stand by assistance Dynamic Sitting Balance Dynamic Sitting - Level of Assistance: 5: Stand by assistance Static Standing Balance Static Standing - Balance Support: Bilateral upper extremity supported;During functional activity Static Standing - Level of Assistance: 5: Stand by assistance Dynamic Standing Balance Dynamic Standing - Balance Support: Bilateral upper extremity supported;During functional activity Dynamic Standing - Level of Assistance: 4: Min assist Extremity Assessment  RUE Assessment RUE Assessment: Within Functional Limits LUE Assessment LUE Assessment: Within Functional Limits RLE Assessment Passive Range of Motion (PROM) Comments: WFLs General Strength Comments: WFLs LLE Assessment Active Range of Motion (AROM) Comments: WFLs General Strength Comments: mild decreased strength throughout    Refer to Care Plan for Long Term Goals  Recommendations for other services: None   Discharge Criteria: Patient will be discharged from PT if patient refuses treatment 3 consecutive times without medical reason, if treatment goals not met, if there is a change in medical status, if patient  makes no progress towards goals or if patient is discharged from hospital.  The above assessment, treatment plan, treatment alternatives and goals were discussed and mutually agreed upon: by patient  Dub Amis 12/11/2019, 4:17 PM

## 2019-12-11 NOTE — IPOC Note (Addendum)
Overall Plan of Care [facility]) Patient Details Name: Brandon Sanchez MRN: [id number] DOB: March 16, 1946  Admitting Diagnosis: Cerebral embolism with cerebral infarction [facility])  Hospital Problems: Principal Problem:   Cerebral embolism with cerebral infarction Active Problems:   CVA (cerebral vascular accident) (HCC)   Basal ganglia stroke (HCC)     Functional Problem List: Nursing Bladder, Bowel, Edema, Endurance, Medication Management, Pain, Safety, Skin Integrity  PT Balance, Motor  OT Balance, Motor  SLP    TR         Basic ADL's: OT Bathing, Dressing, Toileting, Grooming     Advanced  ADL's: OT Simple Meal Preparation     Transfers: PT Bed Mobility, Bed to Chair, Car  OT Toilet, Tub/Shower     Locomotion: PT Ambulation, Stairs     Additional Impairments: OT None  SLP        TR      Anticipated Outcomes Item Anticipated Outcome  Self Feeding no goal, pt is independent  Swallowing      Basic self-care  Mod I  Toileting  Mod I   Bathroom Transfers Mod I  Bowel/Bladder  Supervision assist  Transfers  mod I transfers  Locomotion  mod I gait and stairs  Communication     Cognition     Pain  <3 on a 0-10 pain scale  Safety/Judgment  Supervision assist   Therapy Plan: PT Intensity: Minimum of 1-2 x/day ,45 to 90 minutes PT Frequency: 5 out of 7 days PT Duration Estimated Length of Stay: 7 days OT Intensity: Minimum of 1-2 x/day, 45 to 90 minutes OT Frequency: 5 out of 7 days OT Duration/Estimated Length of Stay: 7-10 days     Due to the current state of emergency, patients may not be receiving their 3-hours of Medicare-mandated therapy.   Team Interventions: Nursing Interventions Patient/Family Education, Bladder Management, Bowel Management, Disease Management/Prevention, Discharge Planning, Psychosocial Support, Medication Management, Pain Management  PT interventions Ambulation/gait training, Functional mobility training, Therapeutic  Activities, Visual/perceptual remediation/compensation, Balance/vestibular training, Neuromuscular re-education, Therapeutic Exercise, UE/LE Strength taining/ROM, UE/LE Coordination activities, Stair training, Patient/family education  OT Interventions Balance/vestibular training, Discharge planning, Functional mobility training, DME/adaptive equipment instruction, Patient/family education, Psychosocial support, Self Care/advanced ADL retraining, Therapeutic Activities, Therapeutic Exercise, UE/LE Strength taining/ROM, UE/LE Coordination activities, Neuromuscular re-education  SLP Interventions    TR Interventions    SW/CM Interventions Discharge Planning, Disease Management/Prevention, Psychosocial Support, Patient/Family Education   Barriers to Discharge MD  Medical stability  Nursing Inaccessible home environment, Decreased caregiver support, Home environment access/layout, Lack of/limited family support    PT Inaccessible home environment, Decreased caregiver support    OT      SLP      SW Home environment access/layout, Decreased caregiver support 17 steps to B+B, 1/2 bath on main; 8 step entry of home with left rail   Team Discharge Planning: Destination: PT-Home ,OT- Home , SLP-  Projected Follow-up: PT-Home health PT, OT-  Home health OT, SLP-  Projected Equipment Needs: PT- , OT- Tub/shower seat, SLP-  Equipment Details: PT- , OT-  Patient/family involved in discharge planning: PT- Patient,  OT-Patient, SLP-   MD ELOS: 8-12d Medical Rehab Prognosis:  Good Assessment:  74 year old RH male with history of T2DM, HTN, cataracts--blurry vision/needs surgery, alcohol abuse--quit 1/21, recent diagnosis of A. Fib--on Eliquis who was admitted on 12/03/2019 with left hemiparesis, facial droop and dysarthria.  History taken from chart review, patient, and attending physician.  CT head unremarkable for acute intracranial process.  CTA head/neck was negative for LVO, atherosclerotic changes  distal CCA and carotid bifurcation, 50% stenosis left-VA and showed degenerative changes of cervical spine.  MRI brain personally reviewed, showing small right brain infarct.  Per report, MRI/MRI brain, acute right basal ganglia/CR infarct.  Echocardiogram with ejection fraction of 55 to 60% with moderate aortic stenosis and mild concentric LVH with grade 3 DD.  Dr. Erlinda Hong felt the stroke was due to large vessel disease-- likely right CCA mixed plaque and low-dose ASA added to Eliquis.  Lipitor increased to 80 mg.  Patient has had fluctuating left-sided symptoms due to hypotension therefore metoprolol and Lasix have been discontinued.  He has had some increasing bipedal edema with reports of pain and numbness.  Discussed patient's recent complaint of toe pain and discharge with attending physician.  Patient continues to be limited by left-sided weakness affecting ADLs and mobility   Now requiring 24/7 Rehab RN,MD, as well as CIR level PT, OT and SLP.  Treatment team will focus on ADLs and mobility with goals set at modI See Team Conference Notes for weekly updates to the plan of care

## 2019-12-11 NOTE — Plan of Care (Signed)
  Problem: Consults Goal: RH STROKE PATIENT EDUCATION Description: See Patient Education module for education specifics  Outcome: Progressing Goal: Diabetes Guidelines if Diabetic/Glucose > 140 Description: If diabetic or lab glucose is > 140 mg/dl - Initiate Diabetes/Hyperglycemia Guidelines & Document Interventions  Outcome: Progressing   Problem: RH BLADDER ELIMINATION Goal: RH STG MANAGE BLADDER WITH ASSISTANCE Description: STG Manage Bladder With supervision Assistance Outcome: Progressing   Problem: RH SKIN INTEGRITY Goal: RH STG MAINTAIN SKIN INTEGRITY WITH ASSISTANCE Description: STG Maintain Skin Integrity With supervision Assistance. Outcome: Progressing   Problem: RH SAFETY Goal: RH STG ADHERE TO SAFETY PRECAUTIONS W/ASSISTANCE/DEVICE Description: STG Adhere to Safety Precautions With supervision Assistance appropriate assistive Device. Outcome: Progressing   Problem: RH PAIN MANAGEMENT Goal: RH STG PAIN MANAGED AT OR BELOW PT'S PAIN GOAL Description: <3 on a 0-10 pain scale Outcome: Progressing   Problem: RH KNOWLEDGE DEFICIT Goal: RH STG INCREASE KNOWLEDGE OF DIABETES Description: Patient will demonstrate knowledge of diabetic medications, dietary restrictions, and follow up care with the MD with min assist from CIR staff. Outcome: Progressing Goal: RH STG INCREASE KNOWLEDGE OF HYPERTENSION Description: Patient will demonstrate knowledge of HTN medications, dietary restrictions, and follow up care with the MD with min assist from CIR staff.  Outcome: Progressing Goal: RH STG INCREASE KNOWLEGDE OF HYPERLIPIDEMIA Description: Patient will demonstrate knowledge of HLD medications, dietary restrictions, and follow up care with the MD with min assist from CIR staff.  Outcome: Progressing Goal: RH STG INCREASE KNOWLEDGE OF STROKE PROPHYLAXIS Description: Patient will demonstrate increased knowledge of medications used to prevent future strokes with min assist from CIR  staff. Outcome: Progressing   

## 2019-12-11 NOTE — Evaluation (Signed)
Occupational Therapy Assessment and Plan  Patient Details  Name: Brandon Sanchez MRN: [id number] Date of Birth: 1945-10-30  OT Diagnosis: hemiplegia affecting non-dominant side Rehab Potential: Rehab Potential (ACUTE ONLY): Excellent ELOS: 7-10 days   Today's Date: 12/11/2019 OT Individual Time: 6237-6283 and 1500-1600 OT Individual Time Calculation (min): 60 min   And 60 min  Problem List:  Patient Active Problem List   Diagnosis Date Noted  . Basal ganglia stroke ([place]) 12/10/2019  . CHF (congestive heart failure) ([place])   . Left-sided weakness   . Slow transit constipation   . Pain in toes of both feet   . Cerebral embolism with cerebral infarction 12/04/2019  . CVA (cerebral vascular accident) ([place]) 12/04/2019  . Acute left-sided weakness 12/03/2019  . Mixed diabetic hyperlipidemia associated with type 2 diabetes mellitus ([place]) 12/03/2019  . Paroxysmal atrial fibrillation (HCC)   . Secondary hypercoagulable state ([place]) 09/28/2019  . Atrial fibrillation with rapid ventricular response ([place]) 09/20/2019  . CAP (community acquired pneumonia) 09/20/2019  . Obesity 05/12/2015  . Uncontrolled type 2 diabetes mellitus with hypoglycemia without coma, with long-term current use of insulin ([place]) 02/04/2008  . HLD (hyperlipidemia) 02/04/2008  . Essential hypertension 02/04/2008  . ALLERGIC RHINITIS 02/04/2008  . 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 15/17/6160    Past Medical History:  Past Medical History:  Diagnosis Date  . Allergic rhinitis, cause unspecified   . Asthma   . Cataract cortical, senile, bilateral   . Macular degeneration of left eye   . Other and unspecified hyperlipidemia   . Type II or unspecified type diabetes mellitus without mention of complication, not stated as uncontrolled   . Unspecified essential hypertension    Past Surgical History:  Past Surgical History:  Procedure Laterality Date  . CARDIOVERSION N/A 09/22/2019   Procedure:  CARDIOVERSION;  Surgeon: Donato Heinz, MD;  Location[facility] END[facility]COPY;  Service: Endoscopy;  Laterality: N/A;  . CARDIOVERSION N/A 10/18/2019   Procedure: CARDIOVERSION;  Surgeon: Jerline Pain, MD;  Location: [facility] ENDOSCOPY;  Service: Cardiovascular;  Laterality: N/A;  . TEE WITHOUT CARDIOVERSION N/A 09/22/2019   Procedure: TRANSESOPHAGEAL ECHOCARDIOGRAM (TEE);  Surgeon: Donato Heinz, MD;  Location: [facility] ENDOSCOPY;  Service: Endoscopy;  Laterality: N/A;    Assessment & Plan Clinical Impression: Brandon Sanchez. Gates is a 74 year old RH male with history of T2DM, HTN, cataracts--blurry vision/needs surgery, alcohol abuse--quit 1/21, recent diagnosis of A. Fib--on Eliquis who was admitted on 12/03/2019 with left hemiparesis, facial droop and dysarthria.  History taken from chart review, patient, and attending physician.  CT head unremarkable for acute intracranial process.  CTA head/neck was negative for LVO, atherosclerotic changes distal CCA and carotid bifurcation, 50% stenosis left-VA and showed degenerative changes of cervical spine.  MRI brain personally reviewed, showing small right brain infarct.  Per report, MRI/MRI brain, acute right basal ganglia/CR infarct.  Echocardiogram with ejection fraction of 55 to 60% with moderate aortic stenosis and mild concentric LVH with grade 3 DD.  Dr. Erlinda Sanchez felt the stroke was due to large vessel disease-- likely right CCA mixed plaque and low-dose ASA added to Eliquis.  Lipitor increased to 80 mg.  Patient has had fluctuating left-sided symptoms due to hypotension therefore metoprolol and Lasix have been discontinued.  He has had some increasing bipedal edema with reports of pain and numbness.  Discussed patient's recent complaint of toe pain and discharge with attending physician.  Patient continues to be limited by left-sided weakness affecting ADLs and mobility.  CIR recommended for follow-up therapy.  Please see preadmission assessment from earlier today as  well.     Patient transferred to CIR on 12/10/2019 .    Patient currently requires min with basic self-care skills secondary to unbalanced muscle activation and decreased coordination and decreased standing balance and hemiplegia.  Prior to hospitalization, patient could complete BADLs independently.   Patient will benefit from skilled intervention to increase independence with basic self-care skills prior to discharge home independently with intermittent A from friends and daughter.  Anticipate patient will require intermittent supervision and follow up home health.  OT - End of Session Activity Tolerance: Tolerates 30+ min activity without fatigue OT Assessment Rehab Potential (ACUTE ONLY): Excellent OT Patient demonstrates impairments in the following area(s): Balance;Motor OT Basic ADL's Functional Problem(s): Bathing;Dressing;Toileting;Grooming OT Advanced ADL's Functional Problem(s): Simple Meal Preparation OT Transfers Functional Problem(s): Toilet;Tub/Shower OT Additional Impairment(s): None OT Plan OT Intensity: Minimum of 1-2 x/day, 45 to 90 minutes OT Frequency: 5 out of 7 days OT Duration/Estimated Length of Stay: 7-10 days OT Treatment/Interventions: Balance/vestibular training;Discharge planning;Functional mobility training;DME/adaptive equipment instruction;Patient/family education;Psychosocial support;Self Care/advanced ADL retraining;Therapeutic Activities;Therapeutic Exercise;UE/LE Strength taining/ROM;UE/LE Coordination activities;Neuromuscular re-education OT Self Feeding Anticipated Outcome(s): no goal, pt is independent OT Basic Self-Care Anticipated Outcome(s): Mod I OT Toileting Anticipated Outcome(s): Mod I OT Bathroom Transfers Anticipated Outcome(s): Mod I OT Recommendation Patient destination: Home Follow Up Recommendations: Home health OT Equipment Recommended: Tub/shower seat   Skilled Therapeutic Intervention Visit 1:  No c/o pain. Pt seen for initial  evaluation and ADL training with a focus on balance and functional mobility.  Pt received in bed and participated well in the eval. Explained role of OT and discussed pt's goals. He stated that when he had his stroke he could not lift his L arm, today he has 4+/ 5 strength in LUE.   Pt explained his long history of bad knees and that standing up has been challenging for a long time.   With a RW, pt stood and ambulated to bathroom 2x with CGA/min A to toilet 2x, showered on bench with min A. And CGA with dressing.  Pt with RN at end of session.   Visit 2: Pain: no c/o pain  Pt received in bed and stated his brief was wet from his frequent urgency. He sat to EOB and donned socks and tie tennis shoes without difficulty.   Pt stood from bed and ambulated with RW with S to bathroom.  Assisted pt with changing brief.  To conserve energy, pt taken to gym via w/c.   Transferred to mat without RW with S.   To work on [facility] pt worked on placing small pegs into medication bottle with no difficulty. He does have a slight tremor that pt stated he had this before.  He fastened resistive clothespins (used the 3 hardest levels) to dowels with no difficulty. Transferred back to wc and taken back to room. Pt requested to transfer to bed without RW and he was able to do so without difficulty.   Pt resting in bed with all needs met. Bed alarm set.   OT Evaluation Precautions/Restrictions  Precautions Precautions: Fall Precaution Comments: L hemiplegia Restrictions Weight Bearing Restrictions: No    Pain Pain Assessment Pain Score: 0-No pain Home Living/Prior Functioning Home Living Family/patient expects to be discharged to:: Private residence Living Arrangements: Alone Available Help at Discharge: Family, Other (Comment) Type of Home: House Home Access: Stairs to enter [employer] of Steps: 10 from parking spot  to door Entrance Stairs-Rails: Left Home Layout: Two level, Bed/bath upstairs, 1/2  bath on main level Alternate Level Stairs-Number of Steps: 17 with landing Alternate Level Stairs-Rails: Right Bathroom Shower/Tub: Walk-in shower  Lives With: Alone Prior Function Level of Independence: Independent with basic ADLs, Independent with gait, Independent with transfers, Independent with homemaking with ambulation  Able to Take Stairs?: Yes Driving: No Vocation: Retired ADL ADL Upper Body Bathing: Supervision/safety Where Assessed-Upper Body Bathing: Retail buyer Bathing: Minimal assistance Where Assessed-Lower Body Bathing: Shower Upper Body Dressing: Supervision/safety Where Assessed-Upper Body Dressing: Edge of bed Lower Body Dressing: Minimal assistance Where Assessed-Lower Body Dressing: Edge of bed Toileting: Minimal assistance Where Assessed-Toileting: Glass blower/designer: Psychiatric nurse Method: Counselling psychologist: Ambulance person Transfer: Minimal Museum/gallery conservator Method: Optometrist: Radio broadcast assistant Vision Baseline Vision/History: Wears glasses;Cataracts Wears Glasses: At all times Patient Visual Report: No change from baseline Perception  Perception: Within Functional Limits Praxis Praxis: Intact Cognition Overall Cognitive Status: Within Functional Limits for tasks assessed Arousal/Alertness: Awake/alert Orientation Level: Person;Place;Situation Person: Oriented Place: Oriented Situation: Oriented Year: 2021 Month: April Day of Week: Correct Memory: Appears intact Immediate Memory Recall: Sock;Blue;Bed Memory Recall Sock: Without Cue Memory Recall Blue: Without Cue Memory Recall Bed: Without Cue Sensation Sensation Light Touch: Appears Intact Hot/Cold: Appears Intact Proprioception: Appears Intact Stereognosis: Appears Intact Motor   [place] WNL - pt has a slight tremor that he has had for some time before his CVA Mobility  Transfers Sit to Stand:  Minimal Assistance - Patient > 75% Stand to Sit: Contact Guard/Touching assist  Trunk/Postural Assessment  Cervical Assessment Cervical Assessment: Within Functional Limits Thoracic Assessment Thoracic Assessment: Within Functional Limits Lumbar Assessment Lumbar Assessment: Within Functional Limits Postural Control Postural Control: Within Functional Limits  Balance Dynamic Sitting Balance Dynamic Sitting - Level of Assistance: 5: Stand by assistance Static Standing Balance Static Standing - Level of Assistance: 5: Stand by assistance Dynamic Standing Balance Dynamic Standing - Level of Assistance: 4: Min assist Extremity/Trunk Assessment RUE Assessment RUE Assessment: Within Functional Limits LUE Assessment LUE Assessment: Within Functional Limits     Refer to Care Plan for Long Term Goals  Recommendations for other services: None    Discharge Criteria: Patient will be discharged from OT if patient refuses treatment 3 consecutive times without medical reason, if treatment goals not met, if there is a change in medical status, if patient makes no progress towards goals or if patient is discharged from hospital.  The above assessment, treatment plan, treatment alternatives and goals were discussed and mutually agreed upon: by patient  [facility] 12/11/2019, 12:36 PM

## 2019-12-12 LAB — GLUCOSE, CAPILLARY
Glucose-Capillary: 70 mg/dL (ref 70–99)
Glucose-Capillary: 164 mg/dL — ABNORMAL HIGH (ref 70–99)
Glucose-Capillary: 122 mg/dL — ABNORMAL HIGH (ref 70–99)
Glucose-Capillary: 76 mg/dL (ref 70–99)

## 2019-12-13 ENCOUNTER — Inpatient Hospital Stay (HOSPITAL_COMMUNITY): Payer: Federal, State, Local not specified - PPO

## 2019-12-13 ENCOUNTER — Inpatient Hospital Stay (HOSPITAL_COMMUNITY): Payer: Federal, State, Local not specified - PPO | Admitting: Occupational Therapy

## 2019-12-13 LAB — GLUCOSE, CAPILLARY
Glucose-Capillary: 90 mg/dL (ref 70–99)
Glucose-Capillary: 166 mg/dL — ABNORMAL HIGH (ref 70–99)
Glucose-Capillary: 98 mg/dL (ref 70–99)
Glucose-Capillary: 91 mg/dL (ref 70–99)

## 2019-12-13 LAB — BASIC METABOLIC PANEL
Anion gap: 8 (ref 5–15)
BUN: 10 mg/dL (ref 8–23)
CO2: 27 mmol/L (ref 22–32)
Calcium: 8.8 mg/dL — ABNORMAL LOW (ref 8.9–10.3)
Chloride: 107 mmol/L (ref 98–111)
Creatinine, Ser: 1.03 mg/dL (ref 0.61–1.24)
GFR calc Af Amer: >60 mL/min (ref >60)
GFR calc non Af Amer: >60 mL/min (ref >60)
Glucose, Bld: 98 mg/dL (ref 70–99)
Potassium: 3.6 mmol/L (ref 3.5–5.1)
Sodium: 142 mmol/L (ref 135–145)

## 2019-12-13 LAB — CBC
HCT: 32.2 % — ABNORMAL LOW (ref 39.0–52.0)
Hemoglobin: 9.6 g/dL — ABNORMAL LOW (ref 13.0–17.0)
MCH: 26.5 pg (ref 26.0–34.0)
MCHC: 29.8 g/dL — ABNORMAL LOW (ref 30.0–36.0)
MCV: 89 fL (ref 80.0–100.0)
Platelets: 289 10*3/uL (ref 150–400)
RBC: 3.62 MIL/uL — ABNORMAL LOW (ref 4.22–5.81)
RDW: 15.2 % (ref 11.5–15.5)
WBC: 9.9 10*3/uL (ref 4.0–10.5)
nRBC: 0 % (ref 0.0–0.2)

## 2019-12-13 MED ORDER — WHITE PETROLATUM EX OINT
TOPICAL_OINTMENT | CUTANEOUS | Status: AC
  Filled 2019-12-13: qty 28.35

## 2019-12-13 NOTE — Care Management (Signed)
Inpatient Rehabilitation Center Individual Statement of Services  Patient Name:  Brandon Sanchez  Date:  12/13/2019  Welcome to the Inpatient Rehabilitation Center.  Our goal is to provide you with an individualized program based on your diagnosis and situation, designed to meet your specific needs.  With this comprehensive rehabilitation program, you will be expected to participate in at least 3 hours of rehabilitation therapies Monday-Friday, with modified therapy programming on the weekends.  Your rehabilitation program will include the following services:  Physical Therapy (PT), Occupational Therapy (OT), Speech Therapy (ST), 24 hour per day rehabilitation nursing, Therapeutic Recreation (TR), Neuropsychology, Case Management (Social Worker), Rehabilitation Medicine, Nutrition Services and Pharmacy Services  Weekly team conferences will be held on Wednesdays to discuss your progress.  Your Social Automotive engineer will talk with you frequently to get your input and to update you on team discussions.  Team conferences with you and your family in attendance may also be held.  Expected length of stay: 7 days  Overall anticipated outcome: Modified Independent  Depending on your progress and recovery, your program may change. Your Social Automotive engineer will coordinate services and will keep you informed of any changes. Your Social Worker's/Care Manager's name and contact numbers are listed  below.  The following services may also be recommended but are not provided by the Inpatient Rehabilitation Center:    Home Health Rehabilitation Services  Outpatient Rehabilitation Services    Arrangements will be made to provide these services after discharge if needed.  Arrangements include referral to agencies that provide these services.  Your insurance has been verified to be:  [employer] PPO Your primary doctor is:  Merri Brunette, MD  Pertinent information will be shared with your doctor  and your insurance company.  Care Manager/Social Worker: Chana Bode, RN  [phone] or [phone]  Information discussed with and copy given to patient by: Pamelia Hoit, 12/13/2019, 3:03 PM

## 2019-12-13 NOTE — Progress Notes (Signed)
[facility] PHYSICAL MEDICINE & REHABILITATION PROGRESS NOTE   Subjective/Complaints:  Pt reports last BM was yesterday- had 2 large BMs- doesn't feel constipated anymore.   Has some pain in feet/toes- doesn't know why- says mainly in big toes, but denies swelling or redness.   Said he was told by endo "can't be neuropathy" since his BGs has never been over 8, at least for years, per pt.    ROS:  Pt denies SOB, abd pain, CP, N/V/C/D, and vision changes   Objective:   No results found. Recent Labs    12/11/19 0501 12/13/19 0657  WBC 9.7 9.9  HGB 9.1* 9.6*  HCT 29.9* 32.2*  PLT 301 289   Recent Labs    12/11/19 0501 12/13/19 0657  NA 143 142  K 3.3* 3.6  CL 106 107  CO2 29 27  GLUCOSE 82 98  BUN 10 10  CREATININE 1.04 1.03  CALCIUM 8.9 8.8*    Intake/Output Summary (Last 24 hours) at 12/13/2019 1932 Last data filed at 12/13/2019 1753 Gross per 24 hour  Intake 852 ml  Output --  Net 852 ml     Physical Exam: Vital Signs Blood pressure (!) 105/58, pulse 76, temperature 98.7 F (37.1 C), resp. rate 20, height 6\' 2"  (1.88 m), weight 129.7 kg, SpO2 96 %.  General:sitting up in manual w/c, in room, NAD Talkative, appropriate Heart: RRR Lungs:CTAb/l Abdomen: soft, NT, ND, (+)BS Extremities: No clubbing, cyanosis Skin: No evidence of breakdown, no evidence of rash Neurologic: Cranial nerves II through XII intact, motor strength is 5/5 in bilateral deltoid, bicep, tricep, grip, hip flexor, knee extensors, ankle dorsiflexor and plantar flexor Sensory exam normal sensation to light touch in bilateral upper and lower extremities Musculoskeletal: Full range of motion in all 4 extremities. No joint swelling Trace pedal edema No feet redness, gross swelling in great toes- no signs of gout    Assessment/Plan: 1. Functional deficits secondary to right basal ganglia infarct which require 3+ hours per day of interdisciplinary therapy in a comprehensive inpatient rehab  setting.  Physiatrist is providing close team supervision and 24 hour management of active medical problems listed below.  Physiatrist and rehab team continue to assess barriers to discharge/monitor patient progress toward functional and medical goals  Care Tool:  Bathing    Body parts bathed by patient: Front perineal area, Buttocks   Body parts bathed by helper: Right lower leg, Left lower leg     Bathing assist Assist Level: Supervision/Verbal cueing     Upper Body Dressing/Undressing Upper body dressing   What is the patient wearing?: Pull over shirt    Upper body assist Assist Level: Contact Guard/Touching assist    Lower Body Dressing/Undressing Lower body dressing      What is the patient wearing?: Incontinence brief, Pants     Lower body assist Assist for lower body dressing: Minimal Assistance - Patient > 75%     Toileting Toileting Toileting Activity did not occur (Clothing management and hygiene only): N/A (no void or bm)(condom cath in place, no BM)  Toileting assist Assist for toileting: Contact Guard/Touching assist     Transfers Chair/bed transfer  Transfers assist     Chair/bed transfer assist level: Supervision/Verbal cueing     Locomotion Ambulation   Ambulation assist      Assist level: Minimal Assistance - Patient > 75% Assistive device: No Device Max distance: 80 ft   Walk 10 feet activity   Assist     Assist  level: Minimal Assistance - Patient > 75% Assistive device: No Device   Walk 50 feet activity   Assist    Assist level: Minimal Assistance - Patient > 75% Assistive device: No Device    Walk 150 feet activity   Assist    Assist level: Contact Guard/Touching assist Assistive device: Walker-rolling    Walk 10 feet on uneven surface  activity   Assist     Assist level: Contact Guard/Touching assist(Per report ) Assistive device: Aeronautical engineer Will patient use  wheelchair at discharge?: No             Wheelchair 50 feet with 2 turns activity    Assist            Wheelchair 150 feet activity     Assist          Blood pressure (!) 105/58, pulse 76, temperature 98.7 F (37.1 C), resp. rate 20, height 6\' 2"  (1.88 m), weight 129.7 kg, SpO2 96 %.  Medical Problem List and Plan: 1.  Left hemiparesis, deficits with endurance, and self-care secondary to acute right basal ganglia/CR infarct.             -patient may shower             -ELOS/Goals: 8-12 days/supervision/mod I            PT OT eval's 2.  Antithrombotics: -DVT/anticoagulation:  Pharmaceutical: Other (comment)Eliquis              -antiplatelet therapy: Low dose ASA 3. Pain Management: Tylenol prn effective for total pain.              Monitor with increased ambulation 4. Mood: LCSW to follow for evaluation and support.              -antipsychotic agents: NA 5. Neuropsych: This patient is capable of making decisions on his own behalf. 6. Skin/Wound Care: Routine pressure relief measures.  7. Fluids/Electrolytes/Nutrition: Monitor I/Os.  CMP ordered. 8. HTN: Monitor BP tid--continue to hold lasix and metoprolol for now.         Vitals:   12/13/19 0357 12/13/19 1457  BP: 134/75 (!) 105/58  Pulse: 70 76  Resp: 16 20  Temp: 98.5 F (36.9 C) 98.7 F (37.1 C)  SpO2: 94% 96%  4/26- BP controlled- con't meds 9. T2DM with hyperglycemia: Intake has been good--will continue to monitor BS ac/hs. Continue to titrate 70/30 insulin--used 20 units bid depending on BS (per Dr. Chalmers Cater)            CBG (last 3)  Recent Labs    12/13/19 0633 12/13/19 1125 12/13/19 1643  GLUCAP 90 166* 98  4/26- BGs controlled- con't regimen 10. PAF: Monitor heart rate tid--continue amiodarone. Off Toprol at this time.              Monitor with increased exertion 11. H/o depression: Mood stable on Zoloft. 12. Constipation: Continue senna S bid.              Adjust bowel meds as  necessary  4/26- doing better- con't regimen. 13. GERD: Continue PPI.  74. H/o asthma: Resume Breo.    LOS: 3 days A FACE TO FACE EVALUATION WAS PERFORMED  Lashana Spang 12/13/2019, 7:32 PM

## 2019-12-13 NOTE — Progress Notes (Signed)
Occupational Therapy Session Note  Patient Details  Name: Brandon Sanchez MRN: [id number] Date of Birth: 1946/03/22  Today's Date: 12/13/2019 OT Individual Time: 1300-1415 OT Individual Time Calculation (min): 75 min    Short Term Goals: Week 1:  OT Short Term Goal 1 (Week 1): STGs = LTGs  Skilled Therapeutic Interventions/Progress Updates:    Pt completed functional mobility from his room down to the ADL apartment with supervision during session.  He then worked on Passenger transport manager transfers with use of the RW and shower seat.  Pt was able to complete sit to stand from the love seat with supervision and completed posterior/anterior transfer into the shower and out.  Discussed his concern with the shower seat as he does not know if it will have handles that he can push up from.  If not, therapist recommended use of a 3:1 as it can be placed in the shower and he can use it for support, having adequate arm rests for sit to stand.  While in the apartment also began education on completion of simple meal prep with use of the RW.  Based on pt's setup, he will be able to use the counter tops and surfaces for transporting items.  He likes to eat in his living room however, and because of this recommend use of a walker tray for home if the RW is the best choice for mobility.  Pt voiced understanding and we will continue to address in therapy.  Finished session with transfer back to the room via walker with supervision.  Pt was left sitting up in the wheelchair with call button and phone in reach.    Therapy Documentation Precautions:  Precautions Precautions: Fall Precaution Comments: L hemiparesis Restrictions Weight Bearing Restrictions: No  Pain: Pain Assessment Pain Scale: Faces Faces Pain Scale: Hurts a little bit Pain Type: Chronic pain Pain Location: Knee Pain Orientation: Right Pain Descriptors / Indicators: Discomfort Pain Onset: With Activity Pain  Intervention(s): Repositioned ADL: See Care Tool Section for some details of mobility and selfcare  Therapy/Group: Individual Therapy  Tajuana Kniskern OTR/L 12/13/2019, 3:39 PM

## 2019-12-13 NOTE — Progress Notes (Signed)
Inpatient Rehabilitation  Patient information reviewed and entered into eRehab system by Chriss Mannan M. Derold Dorsch, M.A., CCC/SLP, PPS Coordinator.  Information including medical coding, functional ability and quality indicators will be reviewed and updated through discharge.    

## 2019-12-13 NOTE — Progress Notes (Signed)
Occupational Therapy Session Note  Patient Details  Name: Brandon Sanchez MRN: [id number] Date of Birth: 09/10/1945  Today's Date: 12/13/2019 OT Individual Time: 0800-0904 OT Individual Time Calculation (min): 64 min    Short Term Goals: Week 1:  OT Short Term Goal 1 (Week 1): STGs = LTGs  Skilled Therapeutic Interventions/Progress Updates:    Pt completed supine to sit to begin session with overall supervision.  He then completed short distance functional mobility in the room to gather his shirt and shorts for dressing at the sink.  He was able to then transfer over to the sink and complete doffing of the soiled brief and donn a new one after setup from therapist.  He also completed washing his front and back peri area.  He was able to complete donning shorts as well as socks and shoes with setup assist as well.  He stood to complete oral hygiene, donning deodorant, and combing over his hair, all with supervision.  He completed functional mobility up the hallway next with use of the RW for support and min guard assist.  Finished session with return to the recliner and pt left sitting up with call button and phone in reach.     Therapy Documentation Precautions:  Precautions Precautions: Fall Precaution Comments: L hemiparesis Restrictions Weight Bearing Restrictions: No  Pain: Pain Assessment Pain Scale: Faces Pain Score: 0-No pain Faces Pain Scale: Hurts little more Pain Type: Chronic pain Pain Location: Knee Pain Orientation: Right Pain Descriptors / Indicators: Discomfort Pain Onset: With Activity Pain Intervention(s): Repositioned;Emotional support ADL: See Care Tool Section for some details of mobility and selfcare  Therapy/Group: Individual Therapy  Britain Anagnos OTR/L 12/13/2019, 12:14 PM

## 2019-12-13 NOTE — Care Management (Signed)
Patient Details  Name: Brandon Sanchez MRN: [id number] Date of Birth: [birth year]  Today's Date: 12/13/2019  Problem List:  Patient Active Problem List   Diagnosis Date Noted  . Basal ganglia stroke (HCC) 12/10/2019  . CHF (congestive heart failure) (HCC)   . Left-sided weakness   . Slow transit constipation   . Pain in toes of both feet   . Cerebral embolism with cerebral infarction 12/04/2019  . CVA (cerebral vascular accident) (HCC) 12/04/2019  . Acute left-sided weakness 12/03/2019  . Mixed diabetic hyperlipidemia associated with type 2 diabetes mellitus (HCC) 12/03/2019  . Paroxysmal atrial fibrillation (HCC)   . Secondary hypercoagulable state (HCC) 09/28/2019  . Atrial fibrillation with rapid ventricular response (HCC) 09/20/2019  . CAP (community acquired pneumonia) 09/20/2019  . Obesity 05/12/2015  . Uncontrolled type 2 diabetes mellitus with hypoglycemia without coma, with long-term current use of insulin (HCC) 02/04/2008  . HLD (hyperlipidemia) 02/04/2008  . Essential hypertension 02/04/2008  . ALLERGIC RHINITIS 02/04/2008  . 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 02/04/2008   Past Medical History:  Past Medical History:  Diagnosis Date  . Allergic rhinitis, cause unspecified   . Asthma   . Cataract cortical, senile, bilateral   . Macular degeneration of left eye   . Other and unspecified hyperlipidemia   . Type II or unspecified type diabetes mellitus without mention of complication, not stated as uncontrolled   . Unspecified essential hypertension    Past Surgical History:  Past Surgical History:  Procedure Laterality Date  . CARDIOVERSION N/A 09/22/2019   Procedure: CARDIOVERSION;  Surgeon: [name], MD;  Location: [facility] ENDOSCOPY;  Service: Endoscopy;  Laterality: N/A;  . CARDIOVERSION N/A 10/18/2019   Procedure: CARDIOVERSION;  Surgeon: Jake Bathe, MD;  Location: [facility] ENDOSCOPY;  Service: Cardiovascular;  Laterality: N/A;  .  TEE WITHOUT CARDIOVERSION N/A 09/22/2019   Procedure: TRANSESOPHAGEAL ECHOCARDIOGRAM (TEE);  Surgeon: [name], MD;  Location: [facility] ENDOSCOPY;  Service: Endoscopy;  Laterality: N/A;   Social History:  reports that he has never smoked. He has never used smokeless tobacco. He reports previous alcohol use. He reports that he does not use drugs.  Family / Support Systems Marital Status: Divorced Patient Roles: Parent Children: Jermar Colter Anticipated Caregiver: daughter + [employer] care agency Ability/Limitations of Caregiver: Min A Caregiver Availability: 24/7  Social History Preferred language: English Religion:  Read: Yes Write: Yes Employment Status: Retired Date Retired/Disabled/Unemployed: Pensions consultant   Abuse/Neglect Abuse/Neglect Assessment Can Be Completed: Yes Physical Abuse: Denies Verbal Abuse: Denies Sexual Abuse: Denies Exploitation of patient/patient's resources: Denies Self-Neglect: Denies  Emotional Status Pt's affect, behavior and adjustment status: Restricted affect, normal mood and behavior  Patient / Family Perceptions, Expectations & Goals Pt/Family understanding of illness & functional limitations: Patient appears to have a fair understanding of current health and functional status Premorbid pt/family roles/activities: Independent prior to admission had a housekeeper monthly and [employer] assisted with transportation Anticipated changes in roles/activities/participation: May need 24/7 supervision for a while after discharge Pt/family expectations/goals: Would like to be independent with ADLs at discharge  [employer]: None Premorbid Home Care/DME Agencies: Other (Comment)([employer]) Transportation available at discharge: Daughter and [employer] to manage transportation at discharge Resource referrals recommended: Neuropsychology  Discharge Planning Living Arrangements: Alone Support Systems: Children,  Friends/neighbors Type of Residence: Private residence Insurance Resources: [employer], Media planner (specify)([employer]PP[employer]) Money Management: Patient Does the patient have any problems obtaining your medications?: No Home Management: Programmer, applications monthly and  daughter will manage home initially at discharge Patient/Family Preliminary Plans: Discharge home with daughter assisting for a while and then hired care givers to attend to patient [employer] Barriers to Discharge: Home environment access/layout, Decreased caregiver support Sw Barriers to Discharge Comments: 17 steps to B+B, 1/2 bath on main; 8 step entry of home with left rail Social Work Anticipated Follow Up Needs: [place] Additional Notes/Comments: Daughter coming in from [place] to live with the patient for a while and then resume [employer] and housekeeper and handyman/lawn man Expected length of stay: 7 days  Clinical Impression Very organized gentleman pleased and surprised by his progress since the stroke. Reported last week he was unable to move the left side , he reported "both arm and leg were heavy and felt nailed to the ground", and now he is able to move it better. Notes daughter purchased a shower chair and he will need to manage multi steps to get into the home and up to a B+B at discharge. Daughter coming in from [place], [place] to stay with him and get him settled and then he will have hired assistance.   Dorien Chihuahua B 12/13/2019, 3:18 PM

## 2019-12-13 NOTE — Progress Notes (Signed)
Physical Therapy Session Note  Patient Details  Name: Brandon Sanchez MRN: [id number] Date of Birth: Jan 30, 1946  Today's Date: 12/13/2019 PT Individual Time: 1002-1100 PT Individual Time Calculation (min): 58 min    Short Term Goals: Week 1:  PT Short Term Goal 1 (Week 1): STGs = LTGs  Skilled Therapeutic Interventions/Progress Updates:    Pt seated in recliner upon PT arrival, agreeable to therapy tx and denies pain. Pt transferred to standing with supervision and ambulated from room>gym x 200 ft with RW and supervision, decreased gait speed overall. Once in the gym pt ambulated x 80 ft this session without AD and with min assist, slight L lateral lean and imbalance noted. Pt participated in berg balance test as detailed below, scored 38/56 and discussed these results with the patient. At this time recommending RW for mobility for safety at home, educated patient that we will continue to work on gait without AD in therapy to challenge balance and gait. Pt ambulated x 20 ft to the steps without AD and min assist, ascended/descended 8 steps this session with single rail and min assist, step to pattern. Pt worked on dynamic standing balance while on airex this session without UE support to perform mini squats on airex x 10, feet apart eyes closed on airex, feet together eyes open on airex 2 x 30 sec, and feet together eyes closed on airex x 30 sec - CGA/min assist. Pt ambulated x 200 ft back to room with RW and supervision, cues for increased gait speed. Pt left in recliner at end of session, needs in reach and chair alarm set.    Therapy Documentation Precautions:  Precautions Precautions: Fall Precaution Comments: L hemiparesis Restrictions Weight Bearing Restrictions: No  Balance Balance Balance Assessed: Yes Standardized Balance Assessment Standardized Balance Assessment: Berg Balance Test Berg Balance Test Sit to Stand: Able to stand without using hands and stabilize  independently Standing Unsupported: Able to stand safely 2 minutes Sitting with Back Unsupported but Feet Supported on Floor or Stool: Able to sit safely and securely 2 minutes Stand to Sit: Sits safely with minimal use of hands Transfers: Able to transfer safely, minor use of hands Standing Unsupported with Eyes Closed: Able to stand 10 seconds with supervision Standing Ubsupported with Feet Together: Able to place feet together independently and stand for 1 minute with supervision From Standing, Reach Forward with Outstretched Arm: Reaches forward but needs supervision From Standing Position, Pick up Object from Floor: Able to pick up shoe, needs supervision From Standing Position, Turn to Look Behind Over each Shoulder: Looks behind from both sides and weight shifts well Turn 360 Degrees: Able to turn 360 degrees safely but slowly Standing Unsupported, Alternately Place Feet on Step/Stool: Able to complete >2 steps/needs minimal assist Standing Unsupported, One Foot in Front: Loses balance while stepping or standing Standing on One Leg: Tries to lift leg/unable to hold 3 seconds but remains standing independently Total Score: 38  Therapy/Group: Individual Therapy  Cresenciano Genre, PT, DPT, CSRS 12/13/2019, 7:52 AM

## 2019-12-14 ENCOUNTER — Inpatient Hospital Stay (HOSPITAL_COMMUNITY): Payer: Federal, State, Local not specified - PPO | Admitting: Occupational Therapy

## 2019-12-14 ENCOUNTER — Inpatient Hospital Stay (HOSPITAL_COMMUNITY): Payer: Federal, State, Local not specified - PPO | Admitting: Physical Therapy

## 2019-12-14 LAB — GLUCOSE, CAPILLARY
Glucose-Capillary: 72 mg/dL (ref 70–99)
Glucose-Capillary: 132 mg/dL — ABNORMAL HIGH (ref 70–99)
Glucose-Capillary: 131 mg/dL — ABNORMAL HIGH (ref 70–99)
Glucose-Capillary: 75 mg/dL (ref 70–99)

## 2019-12-14 MED ORDER — INSULIN ASPART PROT & ASPART (70-30 MIX) 100 UNIT/ML ~~LOC~~ SUSP
14.0000 [IU] | Freq: Two times a day (BID) | SUBCUTANEOUS | Status: DC
  Administered 2019-12-14 – 2019-12-16 (×6): 14 [IU] via SUBCUTANEOUS
  Filled 2019-12-14: qty 10

## 2019-12-14 NOTE — Progress Notes (Signed)
Occupational Therapy Session Note  Patient Details  Name: Brandon Sanchez MRN: [id number] Date of Birth: 14-Jan-1946  Today's Date: 12/14/2019 OT Individual Time: 0800-0902 OT Individual Time Calculation (min): 62 min    Short Term Goals: Week 1:  OT Short Term Goal 1 (Week 1): STGs = LTGs  Skilled Therapeutic Interventions/Progress Updates:    Session 1: (2694-8546) Pt completed functional mobility during session with supervision using the RW for support.  He was able to complete thirteen mins on the Nustep at level 5 resistance.  He was able to maintain 50-60 steps per min throughout with O2 sats at 96% and HR at 91 post exercise.  Next, had him transfer over to the therapy mat with supervision.  He then worked on standing balance with use of the foam mat.  He needed min assist for stepping on and off of the pad with min guard assist for static standing with eyes open and with eyes closed.  LOB to the left when attempting to complete weightshifts and rotate to look behind him.   Finished session with transfer back to the room with use of the RW and supervision.  He was left sitting up in the wheelchair with call button and phone in reach.  Session 2: (1300-1416)  Pt worked on bathing and dressing during session.  He was able to ambulate from the bedside chair to the tub bench with supervision using the RW for support.  He was able to remove all clothing with min guard assist using the grab bars for support in standing.  He was then able to complete dressing in standing with min guard assist.  He then transferred out to the sink where he completed grooming tasks with setup and blow dried his hair.  Pt finished session with transfer back to the recliner where he was left with the call button and phone in reach.    Therapy Documentation Precautions:  Precautions Precautions: Fall Precaution Comments: L hemiparesis Restrictions Weight Bearing Restrictions: No   Pain: Pain Assessment Pain  Scale: Faces Pain Score: 0-No pain ADL: See Care Tool Section for some details of mobility and selfcare  Therapy/Group: Individual Therapy  Haydan Wedig OTR/L 12/14/2019, 12:20 PM

## 2019-12-14 NOTE — Progress Notes (Signed)
[place] PHYSICAL MEDICINE & REHABILITATION PROGRESS NOTE   Subjective/Complaints: No issues overnight tolerated therapy well yesterday.  Is pleased that his therapist thought that he should be able to discharge home at the end of the week  ROS:  Pt denies chest pain, shortness of breath, nausea vomiting diarrhea, bladder issues   Objective:   No results found. Recent Labs    12/13/19 0657  WBC 9.9  HGB 9.6*  HCT 32.2*  PLT 289   Recent Labs    12/13/19 0657  NA 142  K 3.6  CL 107  CO2 27  GLUCOSE 98  BUN 10  CREATININE 1.03  CALCIUM 8.8*    Intake/Output Summary (Last 24 hours) at 12/14/2019 0840 Last data filed at 12/14/2019 4270 Gross per 24 hour  Intake 916 ml  Output --  Net 916 ml     Physical Exam: Vital Signs Blood pressure 129/74, pulse 65, temperature 97.9 F (36.6 C), temperature source Oral, resp. rate 18, height 6\' 2"  (1.88 m), weight 129.7 kg, SpO2 96 %.  General: No acute distress Mood and affect are appropriate Heart: Regular rate and rhythm no rubs murmurs or extra sounds Lungs: Clear to auscultation, breathing unlabored, no rales or wheezes Abdomen: Positive bowel sounds, soft nontender to palpation, nondistended Extremities: No clubbing, cyanosis, or edema Skin: No evidence of breakdown, no evidence of rash Neurologic: Cranial nerves II through XII intact, motor strength is 5/5 in bilateral deltoid, bicep, tricep, grip, hip flexor, knee extensors, ankle dorsiflexor and plantar flexor Sensory exam normal sensation to light touch and proprioception in bilateral upper and lower extremities Normal finger to thumb opposition bilaterally Musculoskeletal: Full range of motion in all 4 extremities. No joint swelling Trace pedal edema     Assessment/Plan: 1. 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  Physiatrist is  providing close team supervision and 24 hour management of active medical problems listed below.  Physiatrist and rehab team continue to assess barriers to discharge/monitor patient progress toward functional and medical goals  Care Tool:  Bathing    Body parts bathed by patient: Front perineal area, Buttocks   Body parts bathed by helper: Right lower leg, Left lower leg     Bathing assist Assist Level: Supervision/Verbal cueing     Upper Body Dressing/Undressing Upper body dressing   What is the patient wearing?: Hospital gown only    Upper body assist Assist Level: Contact Guard/Touching assist    Lower Body Dressing/Undressing Lower body dressing      What is the patient wearing?: Incontinence brief     Lower body assist Assist for lower body dressing: Minimal Assistance - Patient > 75%     Toileting Toileting Toileting Activity did not occur (Clothing management and hygiene only): N/A (no void or bm)(condom cath in place, no BM)  Toileting assist Assist for toileting: Contact Guard/Touching assist     Transfers Chair/bed transfer  Transfers assist     Chair/bed transfer assist level: Supervision/Verbal cueing     Locomotion Ambulation   Ambulation assist      Assist level: Minimal Assistance - Patient > 75% Assistive device: No Device Max distance: 80 ft   Walk 10 feet activity   Assist     Assist level: Minimal Assistance - Patient > 75% Assistive device: No Device   Walk 50 feet activity   Assist    Assist level: Minimal Assistance -  Patient > 75% Assistive device: No Device    Walk 150 feet activity   Assist    Assist level: Contact Guard/Touching assist Assistive device: Walker-rolling    Walk 10 feet on uneven surface  activity   Assist     Assist level: Contact Guard/Touching assist(Per report ) Assistive device: Aeronautical engineer Will patient use wheelchair at discharge?: No              Wheelchair 50 feet with 2 turns activity    Assist            Wheelchair 150 feet activity     Assist          Blood pressure 129/74, pulse 65, temperature 97.9 F (36.6 C), temperature source Oral, resp. rate 18, height 6\' 2"  (1.88 m), weight 129.7 kg, SpO2 96 %.  Medical Problem List and Plan: 1.  Left hemiparesis, deficits with endurance, and self-care secondary to acute right basal ganglia/CR infarct.             -patient may shower             -ELOS/Goals: 8-12 days/supervision/mod I   Continue CIR PT, OT, team conference in a.m. 2.  Antithrombotics: -DVT/anticoagulation:  Pharmaceutical: Other (comment)Eliquis              -antiplatelet therapy: Low dose ASA 3. Pain Management: Tylenol prn effective for total pain.              Monitor with increased ambulation 4. Mood: LCSW to follow for evaluation and support.              -antipsychotic agents: NA 5. Neuropsych: This patient is capable of making decisions on his own behalf. 6. Skin/Wound Care: Routine pressure relief measures.  7. Fluids/Electrolytes/Nutrition: Monitor I/Os.  CMP ordered. 8. HTN: Monitor BP tid--continue to hold lasix and metoprolol for now.         Vitals:   12/14/19 0433 12/14/19 0758  BP: 129/74   Pulse: 66 65  Resp: 17 18  Temp: 97.9 F (36.6 C)   SpO2: 97% 96%  4/26- BP controlled- con't meds 9. T2DM with hyperglycemia: Intake has been good--will continue to monitor BS ac/hs. Continue to titrate 70/30 insulin--used 20 units bid depending on BS (per Dr. Chalmers Cater)            CBG (last 3)  Recent Labs    12/13/19 1643 12/13/19 2108 12/14/19 0611  GLUCAP 98 91 72  CBGs controlled but running on the low side will reduce to 14 units of 70/30 insulin 10. PAF: Monitor heart rate tid--continue amiodarone. Off Toprol at this time.              Monitor with increased exertion 11. H/o depression: Mood stable on Zoloft. 12. Constipation: Continue senna S bid.               Adjust bowel meds as necessary  4/26- doing better- con't regimen. 13. GERD: Continue PPI.  86. H/o asthma: Resume Breo.  No respiratory distress or wheezing #15.  Likely venous stasis causing mild lower extremity swelling.  May use support hose  16.  Normochromic normocytic anemia we will check guaiacs LOS: 4 days A FACE TO FACE EVALUATION WAS PERFORMED  Charlett Blake 12/14/2019, 8:40 AM

## 2019-12-14 NOTE — Progress Notes (Signed)
Physical Therapy Session Note  Patient Details  Name: Brandon Sanchez MRN: [id number] Date of Birth: 05/23/1946  Today's Date: 12/14/2019 PT Individual Time: 1006-1117 PT Individual Time Calculation (min): 71 min   Short Term Goals: Week 1:  PT Short Term Goal 1 (Week 1): STGs = LTGs  Skilled Therapeutic Interventions/Progress Updates:    Pt received sitting recliner and agreeable to therapy session. Reports that one of his main concerns at discharge is frequency of urination - pt planning to follow-up with urologist and educated pt on physical therapists with pelvic health speciality that could be beneficial for this. Sit<>stands with and without AD with supervision throughout session though demonstrates compensation via use of B UE to push into stand - reports has been doing this ~4years. Gait ~41ft with R HHA and CGA for steadying. Gait ~23ft to main therapy gym using RW with close supervision and CGA when turning.  Performed the following standing balance tasks: - static standing without UE support eyes open, eyes closed, and head turns with normal BOS and narrow BOS - close supervision/CGA - alternate foot taps on 4" step: forward and laterally each side x15 reps each with CGA/min assist for steadying throughout - most challenged when tapping L foot up laterally - 3 cone taps with most difficulty taping laterally with L foot during R stance min assist for balance -  dynamic standing balance of forward/backwards stepping over hockey stick without UE support min assist progressed to CGA  Gait ~12ft x2 to/from // bars without UE support and CGA for steadying - on walk back pt demonstrated increased B LE step length and increased gait speed. In // bars for safety, but no UE support, performed lateral side stepping over hockey stick retraining lateral stepping with focus on increased step length and speed.  Repeated pre-extension task of sitting on EOM to forward trunk flexion and anterior  pelvic tilt pushing against manual resistance 5sets to fatigue with cuing for sustained isometric contraction at end range and breathing. Transitioned to repeated sit<>stands with UE support on knees or no UE support targeting increased anterior weight shift followed by trunk/hip/knee extension with cuing for increased gluteal activation - x10reps. \  Gait ~241ft back to room using RW with close supervision progressed to CGA as pt demonstrated decreased L LE foot clearance and step length with fatigue. Pt left seated EOB with NT present to assume care and assist with peri-care.   Therapy Documentation Precautions:  Precautions Precautions: Fall Precaution Comments: L hemiparesis Restrictions Weight Bearing Restrictions: No  Pain:  Reports R ankle pain/weakness rated as 4/10 and B knee discomfort from chronic "arthritis." Provided seated rest breaks and offered ice but pt deferred.    Therapy/Group: Individual Therapy  Ginny Forth, PT, DPT 12/14/2019, 7:55 AM

## 2019-12-15 ENCOUNTER — Ambulatory Visit: Payer: Federal, State, Local not specified - PPO | Admitting: Cardiology

## 2019-12-15 ENCOUNTER — Inpatient Hospital Stay (HOSPITAL_COMMUNITY): Payer: Federal, State, Local not specified - PPO

## 2019-12-15 ENCOUNTER — Inpatient Hospital Stay (HOSPITAL_COMMUNITY): Payer: Federal, State, Local not specified - PPO | Admitting: Occupational Therapy

## 2019-12-15 ENCOUNTER — Inpatient Hospital Stay (HOSPITAL_COMMUNITY): Payer: Federal, State, Local not specified - PPO | Admitting: Physical Therapy

## 2019-12-15 LAB — GLUCOSE, CAPILLARY
Glucose-Capillary: 129 mg/dL — ABNORMAL HIGH (ref 70–99)
Glucose-Capillary: 127 mg/dL — ABNORMAL HIGH (ref 70–99)
Glucose-Capillary: 96 mg/dL (ref 70–99)
Glucose-Capillary: 84 mg/dL (ref 70–99)

## 2019-12-15 LAB — OCCULT BLOOD X 1 CARD TO LAB, STOOL
Fecal Occult Bld: POSITIVE — AB
Fecal Occult Bld: POSITIVE — AB
Fecal Occult Bld: NEGATIVE

## 2019-12-15 MED ORDER — FERROUS SULFATE 220 (44 FE) MG/5ML PO ELIX
220.0000 mg | ORAL_SOLUTION | Freq: Every day | ORAL | Status: DC
  Administered 2019-12-16: 220 mg via ORAL
  Filled 2019-12-15 (×2): qty 5

## 2019-12-15 NOTE — Progress Notes (Signed)
Physical Therapy Session Note  Patient Details  Name: Brandon Sanchez MRN: [id number] Date of Birth: 1945/08/23  Today's Date: 12/15/2019 PT Individual Time: 0802-0902 PT Individual Time Calculation (min): 60 min   Short Term Goals: Week 1:  PT Short Term Goal 1 (Week 1): STGs = LTGs  Skilled Therapeutic Interventions/Progress Updates: Pt presents sitting in recliner and agreeable to therapy.  Pt performed doff gown and donned overhead shirt w/ supervision and occasional cueing.  Pt performed multiple sit to stand transfers w/ close supervision and increased time.  Pt performed multiple short distance gait w/o AD and supervision.  Pt requires occasional verbal cues for safe turns and negotiation in confined spaces.  Pt amb multiple trials w/ RW and supervision up to 200', including turns to return to seat.  Pt negotiated 4 steps w/ 1 rail and step-to gait pattern w/ CGA only and cueing for hand advancement.  Pt performed multiple standing balance activities including toe-taps to 4" platform, both forward and laterally, marching, sidestepping, step-ups to 3" step laterally w/ c/o soreness to right knee.  Pt performed reaching to pick up items from floor w/o LOB.  Pt returned to room and sat in recliner w/ all needs in reach.     Therapy Documentation Precautions:  Precautions Precautions: Fall Precaution Comments: L hemiparesis Restrictions Weight Bearing Restrictions: No General:   Vital Signs: Therapy Vitals Pulse Rate: 81 Resp: 20 Patient Position (if appropriate): Sitting Oxygen Therapy SpO2: 96 % O2 Device: Room Air Pain: pt states right ankle/knee pain 2/10, requested pain meds of nursing after therapy. Pain Assessment Pain Scale: 0-10 Pain Score: 3  Pain Type: Acute pain Pain Location: Ankle Pain Orientation: Right Pain Descriptors / Indicators: Aching Pain Frequency: Occasional Pain Onset: With Activity Patients Stated Pain Goal: 0 Pain Intervention(s): Medication  (See eMAR) Mobility:      Therapy/Group: Individual Therapy  [name] 12/15/2019, 10:19 AM

## 2019-12-15 NOTE — Plan of Care (Signed)
  Problem: Consults Goal: RH STROKE PATIENT EDUCATION Description: See Patient Education module for education specifics  Outcome: Progressing Goal: Diabetes Guidelines if Diabetic/Glucose > 140 Description: If diabetic or lab glucose is > 140 mg/dl - Initiate Diabetes/Hyperglycemia Guidelines & Document Interventions  Outcome: Progressing   Problem: RH BLADDER ELIMINATION Goal: RH STG MANAGE BLADDER WITH ASSISTANCE Description: STG Manage Bladder With supervision Assistance Outcome: Progressing   Problem: RH SKIN INTEGRITY Goal: RH STG MAINTAIN SKIN INTEGRITY WITH ASSISTANCE Description: STG Maintain Skin Integrity With supervision Assistance. Outcome: Progressing   Problem: RH SAFETY Goal: RH STG ADHERE TO SAFETY PRECAUTIONS W/ASSISTANCE/DEVICE Description: STG Adhere to Safety Precautions With supervision Assistance appropriate assistive Device. Outcome: Progressing   Problem: RH PAIN MANAGEMENT Goal: RH STG PAIN MANAGED AT OR BELOW PT'S PAIN GOAL Description: <3 on a 0-10 pain scale Outcome: Progressing   Problem: RH KNOWLEDGE DEFICIT Goal: RH STG INCREASE KNOWLEDGE OF DIABETES Description: Patient will demonstrate knowledge of diabetic medications, dietary restrictions, and follow up care with the MD with min assist from CIR staff. Outcome: Progressing Goal: RH STG INCREASE KNOWLEDGE OF HYPERTENSION Description: Patient will demonstrate knowledge of HTN medications, dietary restrictions, and follow up care with the MD with min assist from CIR staff.  Outcome: Progressing Goal: RH STG INCREASE KNOWLEGDE OF HYPERLIPIDEMIA Description: Patient will demonstrate knowledge of HLD medications, dietary restrictions, and follow up care with the MD with min assist from CIR staff.  Outcome: Progressing Goal: RH STG INCREASE KNOWLEDGE OF STROKE PROPHYLAXIS Description: Patient will demonstrate increased knowledge of medications used to prevent future strokes with min assist from CIR  staff. Outcome: Progressing

## 2019-12-15 NOTE — Progress Notes (Signed)
Physical Therapy Session Note  Patient Details  Name: ANSEN SAYEGH MRN: [id number] Date of Birth: 1946/04/19  Today's Date: 12/15/2019 PT Individual Time: 5631-4970 PT Individual Time Calculation (min): 34 min   Short Term Goals: Week 1:  PT Short Term Goal 1 (Week 1): STGs = LTGs  Skilled Therapeutic Interventions/Progress Updates:     Pt received sitting in recliner and agreeable to therapy. Denies pain. Sit>stand and 200' ambulation with RW with supervision and verbal cues for upright gaze to improve posture and balance and increased proximity to RW for safety. Seated rest break prior to stair training.  Pt performs x2 bouts of x16 6" steps with BHRs, with supervision from PT. PT demonstrates correct sequencing of ascending/descending steps and pt demos good carryover with occasional verbal cues needed for reminder. Pt requires extended seated rest break between bouts and following stair training.  200' ambulation back to room with RW and supervision. Pt left seated in recliner with all needs within reach.  Therapy Documentation Precautions:  Precautions Precautions: Fall Precaution Comments: L hemiparesis Restrictions Weight Bearing Restrictions: No    Therapy/Group: Individual Therapy  Beau Fanny, PT, DPT 12/15/2019, 3:43 PM

## 2019-12-15 NOTE — Progress Notes (Signed)
[place] PHYSICAL MEDICINE & REHABILITATION PROGRESS NOTE   Subjective/Complaints:  Slept well, discussed d/c meds and cardiology f/u   ROS:  Pt denies chest pain, shortness of breath, nausea vomiting diarrhea, bladder issues   Objective:   No results found. Recent Labs    12/13/19 0657  WBC 9.9  HGB 9.6*  HCT 32.2*  PLT 289   Recent Labs    12/13/19 0657  NA 142  K 3.6  CL 107  CO2 27  GLUCOSE 98  BUN 10  CREATININE 1.03  CALCIUM 8.8*    Intake/Output Summary (Last 24 hours) at 12/15/2019 0806 Last data filed at 12/14/2019 1700 Gross per 24 hour  Intake 1400 ml  Output -  Net 1400 ml     Physical Exam: Vital Signs Blood pressure 120/84, pulse 81, temperature 98 F (36.7 C), resp. rate 20, height _0  (1.88 m), weight 129.7 kg, SpO2 96 %.   General: No acute distress Mood and affect are appropriate Heart: Regular rate and rhythm no rubs murmurs or extra sounds Lungs: Clear to auscultation, breathing unlabored, no rales or wheezes Abdomen: Positive bowel sounds, soft nontender to palpation, nondistended Extremities: No clubbing, cyanosis, or edema Skin: No evidence of breakdown, no evidence of rash Neurologic: Cranial nerves II through XII intact, motor strength is 5/5 in bilateral deltoid, bicep, tricep, grip, hip flexor, knee extensors, ankle dorsiflexor and plantar flexor Sensory exam normal sensation to light touch and proprioception in bilateral upper and lower extremities Cerebellar exam normal finger to nose to finger as well as heel to shin in bilateral upper and lower extremities Musculoskeletal: Full range of motion in all 4 extremities. No joint swelling Trace pedal edema     Assessment/Plan: 1. 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  Physiatrist is providing close team supervision and 24 hour management of active  medical problems listed below.  Physiatrist and rehab team continue to assess barriers to discharge/monitor patient progress toward functional and medical goals  Care Tool:  Bathing    Body parts bathed by patient: Right arm, Left arm, Chest, Abdomen, Front perineal area, Buttocks, Right upper leg, Face, Left lower leg, Right lower leg, Left upper leg   Body parts bathed by helper: Right lower leg, Left lower leg     Bathing assist Assist Level: Supervision/Verbal cueing     Upper Body Dressing/Undressing Upper body dressing   What is the patient wearing?: Pull over shirt    Upper body assist Assist Level: Supervision/Verbal cueing    Lower Body Dressing/Undressing Lower body dressing      What is the patient wearing?: Underwear/pull up, Pants     Lower body assist Assist for lower body dressing: Minimal Assistance - Patient > 75%     Toileting Toileting Toileting Activity did not occur (Clothing management and hygiene only): N/A (no void or bm)(condom cath in place, no BM)  Toileting assist Assist for toileting: Contact Guard/Touching assist     Transfers Chair/bed transfer  Transfers assist     Chair/bed transfer assist level: Supervision/Verbal cueing     Locomotion Ambulation   Ambulation assist      Assist level: Contact Guard/Touching assist Assistive device: Walker-rolling Max distance: 272f   Walk 10 feet activity   Assist     Assist level: Contact Guard/Touching assist Assistive device: Walker-rolling   Walk 50 feet activity   Assist  Assist level: Supervision/Verbal cueing Assistive device: Walker-rolling    Walk 150 feet activity   Assist    Assist level: Contact Guard/Touching assist Assistive device: Walker-rolling    Walk 10 feet on uneven surface  activity   Assist     Assist level: Contact Guard/Touching assist(Per report ) Assistive device: Aeronautical engineer Will patient use  wheelchair at discharge?: No             Wheelchair 50 feet with 2 turns activity    Assist            Wheelchair 150 feet activity     Assist          Blood pressure 120/84, pulse 81, temperature 98 F (36.7 C), resp. rate 20, height _0  (1.88 m), weight 129.7 kg, SpO2 96 %.  Medical Problem List and Plan: 1.  Left hemiparesis, deficits with endurance, and self-care secondary to acute right basal ganglia/CR infarct.             -patient may shower             -ELOS/Goals: 8-12 days/supervision/mod I  Team conference today please see physician documentation under team conference tab, met with team  to discuss problems,progress, and goals. Formulized individual treatment plan based on medical history, underlying problem and comorbidities.  2.  Antithrombotics: -DVT/anticoagulation:  Pharmaceutical: Other (comment)Eliquis              -antiplatelet therapy: Low dose ASA 3. Pain Management: Tylenol prn effective for total pain.              Monitor with increased ambulation 4. Mood: LCSW to follow for evaluation and support.              -antipsychotic agents: NA 5. Neuropsych: This patient is capable of making decisions on his own behalf. 6. Skin/Wound Care: Routine pressure relief measures.  7. Fluids/Electrolytes/Nutrition: Monitor I/Os.  CMP ordered. 8. HTN: Monitor BP tid--continue to hold lasix and metoprolol for now.         Vitals:   12/15/19 0408 12/15/19 0733  BP: 120/84   Pulse: 83 81  Resp: 20 20  Temp: 98 F (36.7 C)   SpO2: 95% 96%  controlled  4/28 9. T2DM with hyperglycemia: Intake has been good--will continue to monitor BS ac/hs. Continue to titrate 70/30 insulin--used 20 units bid depending on BS (per Dr. Chalmers Cater)            CBG (last 3)  Recent Labs    12/14/19 1646 12/14/19 2126 12/15/19 0601  GLUCAP 131* 75 129*  Controlled 4/28 14 units of 70/30 insulin BID  10. PAF: Monitor heart rate tid--continue amiodarone. Off Toprol at  this time.             Has f/u appt with cardiology on 5/17 11. H/o depression: Mood stable on Zoloft. 12. Constipation: Continue senna S bid.              Adjust bowel meds as necessary  4/26- doing better- con't regimen. 13. GERD: Continue PPI.  66. H/o asthma: Resume Breo.  No respiratory distress or wheezing #15.  Likely venous stasis causing mild lower extremity swelling.  May use support hose  16.  Normochromic normocytic anemia we will check guaiacs LOS: 5 days A FACE TO FACE EVALUATION WAS PERFORMED  Charlett Blake 12/15/2019, 8:06 AM

## 2019-12-15 NOTE — Patient Care Conference (Signed)
Inpatient RehabilitationTeam Conference and Plan of Care Update Date: 12/15/2019   Time: 10:30 AM   Patient Name: Brandon Sanchez      Medical Record Number: [id number]  Date of Birth: [birth year] Sex: Male         Room/Bed: 4W02C/4W02C-01 Payor Info: Payor: MEDICARE / Plan: MEDICARE PART A AND B / Product Type: *No Product type* /    Admit Date/Time:  12/10/2019  2:49 PM  Primary Diagnosis:  Cerebral embolism with cerebral infarction [facility])  Patient Active Problem List   Diagnosis Date Noted  . Basal ganglia stroke (HCC) 12/10/2019  . CHF (congestive heart failure) (HCC)   . Left-sided weakness   . Slow transit constipation   . Pain in toes of both feet   . Cerebral embolism with cerebral infarction 12/04/2019  . CVA (cerebral vascular accident) (HCC) 12/04/2019  . Acute left-sided weakness 12/03/2019  . Mixed diabetic hyperlipidemia associated with type 2 diabetes mellitus (HCC) 12/03/2019  . Paroxysmal atrial fibrillation (HCC)   . Secondary hypercoagulable state (HCC) 09/28/2019  . Atrial fibrillation with rapid ventricular response (HCC) 09/20/2019  . CAP (community acquired pneumonia) 09/20/2019  . Obesity 05/12/2015  . Uncontrolled type 2 diabetes mellitus with hypoglycemia without coma, with long-term current use of insulin (HCC) 02/04/2008  . HLD (hyperlipidemia) 02/04/2008  . Essential hypertension 02/04/2008  . ALLERGIC RHINITIS 02/04/2008  . 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 02/04/2008    Expected Discharge Date: Expected Discharge Date: 12/17/19  Team Members Present: Physician leading conference: Dr. Claudette Laws Care Coodinator Present: Cheyenne Adas, RN, BSN, CRRN;Christina Vita Barley, BSW;Genie Darrell Hauk, RN, MSN Nurse Present: Other (comment)(Tomeka Pugh, LPN) PT Present: Malachi Pro, PT OT Present: Perrin Maltese, OT SLP Present: Suzzette Righter, CF-SLP PPS Coordinator present : Edson Snowball, Park Breed, SLP     Current  Status/Progress Goal Weekly Team Focus  Bowel/Bladder   incontinent of b/b; LBM: 04/26  time toilet while awake  assist with toileting needs prn   Swallow/Nutrition/ Hydration             ADL's   supervsion for all bathing, dressing, toileting, simulated shower transfers with use of the RW for support  modified independent to independent  selfcare retraining, balance retraining, transfer training, DME education, therapeutic exercise, pt education   Mobility   CGA bed mobility, supervision sit<>stands and stand pivots using RW, gait up to 217ft using RW with CGA/supervision, 8 steps using B HRs with min assist  mod-I overall at ambulatory level  pt education, standing balance, gait training with LRAD, stair navigation, B LE strengthening   Communication             Safety/Cognition/ Behavioral Observations            Pain   no c/o pain  remain pain free  assess pain level QS and prn   Skin   skin intact  maintain skin integrity  assess skin QS and prn    Rehab Goals Patient on target to meet rehab goals: Yes *See Care Plan and progress notes for long and short-term goals.     Barriers to Discharge  Current Status/Progress Possible Resolutions Date Resolved   Nursing                  PT  Inaccessible home environment;Decreased caregiver support                 OT  SLP                SW Home environment access/layout;Decreased caregiver support 17 steps to B+B, 1/2 bath on main; 8 step entry of home with left rail Obtained 2 walkers (1 for each level of home), 3n1 and OP [facility]          Discharge Planning/Teaching Needs:  Home with daughter assisting as needed  Medications, safety tips, transfers, etc.   Team Discussion: MD BP and HR controlled, AFib, chronic venous insufficiency, DM controlled.  RN inc urine, leg swelling, wears depends.  OT set up B/D/toileting/transfers, goals mod I.  PT CGA bed, S sit to stand, gait 200' RW, CGA/S  stairs 8, mod I goals.  Has a 2 story town house.   Revisions to Treatment Plan: N/A     Medical Summary Current Status: Working on balance activities with therapy.,  Blood pressure well controlled, atrial fibrillation with rate control Weekly Focus/Goal: Discharge planning follow-up with multiple MDs as outpatient  Barriers to Discharge: Medical stability   Possible Resolutions to Barriers: Continue rehabilitation program, transition to home environment   Continued Need for Acute Rehabilitation Level of Care: The patient requires daily medical management by a physician with specialized training in physical medicine and rehabilitation for the following reasons: Direction of a multidisciplinary physical rehabilitation program to maximize functional independence : Yes Medical management of patient stability for increased activity during participation in an intensive rehabilitation regime.: Yes Analysis of laboratory values and/or radiology reports with any subsequent need for medication adjustment and/or medical intervention. : Yes   I attest that I was present, lead the team conference, and concur with the assessment and plan of the team.   Jodell Cipro M 12/15/2019, 2:35 PM   Team conference was held via web/ teleconference due to [place] - 19

## 2019-12-15 NOTE — Progress Notes (Signed)
Occupational Therapy Session Note  Patient Details  Name: Brandon Sanchez MRN: [id number] Date of Birth: [birth year]  Today's Date: 12/15/2019 OT Individual Time: 0950-1030 OT Individual Time Calculation (min): 40 min    Short Term Goals: Week 1:  OT Short Term Goal 1 (Week 1): STGs = LTGs  Skilled Therapeutic Interventions/Progress Updates:     Session 1: (0950-1030) Pt completed functional mobility with use of the RW for support and supervision to the therapy gym.  Once in the gym he worked on BUE strengthening exercises with use of the medium orange resistance band.  He needed mod demonstrational cueing for completion of shoulder horizontal abduction, shoulder flexion, shoulder extension, and triceps extension.  He was able to complete 10-15 repetitions for each exercise.  Finished session with ambulation back to the room at supervision with use of the RW.  He was left in the recliner per request to complete session.   Session 2: (4008-6761) Pt completed functional mobility down to the dayroom with close supervision using the RW for support.  He then worked on dynamic standing balance with use of the Wii.  Had him work on stepping on and off of the foam pad as well as standing on the foam to take his turn.  Close supervision for balance.  Also incorporated staggered stance with one LE on the foam and the other on the floor.  Min assist was needed when having him try and step backwards on the foam as well.  When attempting to step backwards with the LLE after achieving staggered stance, he demonstrated decreased ability to complete full step backwards with the LLE to reach his starting position.  He needed at least two steps to bring the LLE all the way back.  Finished session with transfer back to the room via walker with supervision and pt left sitting up in the recliner to rest.    Therapy Documentation Precautions:  Precautions Precautions: Fall Precaution Comments: L  hemiparesis Restrictions Weight Bearing Restrictions: No  Pain: Pain Assessment Pain Scale: Faces Pain Score: 3  Faces Pain Scale: No hurt Pain Type: Acute pain Pain Location: Ankle Pain Orientation: Right Pain Descriptors / Indicators: Aching Pain Frequency: Occasional Pain Onset: With Activity Patients Stated Pain Goal: 0 Pain Intervention(s): Medication (See eMAR) ADL: See Care Tool Section for some details of mobility and selfcare  Therapy/Group: Individual Therapy  Felicite Zeimet OTR/L 12/15/2019, 10:46 AM

## 2019-12-16 ENCOUNTER — Inpatient Hospital Stay (HOSPITAL_COMMUNITY): Payer: Federal, State, Local not specified - PPO

## 2019-12-16 ENCOUNTER — Inpatient Hospital Stay (HOSPITAL_COMMUNITY): Payer: Federal, State, Local not specified - PPO | Admitting: Occupational Therapy

## 2019-12-16 LAB — GLUCOSE, CAPILLARY
Glucose-Capillary: 81 mg/dL (ref 70–99)
Glucose-Capillary: 127 mg/dL — ABNORMAL HIGH (ref 70–99)
Glucose-Capillary: 111 mg/dL — ABNORMAL HIGH (ref 70–99)
Glucose-Capillary: 73 mg/dL (ref 70–99)

## 2019-12-16 MED ORDER — SENNOSIDES-DOCUSATE SODIUM 8.6-50 MG PO TABS
2.0000 | ORAL_TABLET | Freq: Two times a day (BID) | ORAL | Status: DC
  Administered 2019-12-16: 2 via ORAL
  Administered 2019-12-16: 1 via ORAL
  Filled 2019-12-16: qty 2

## 2019-12-16 NOTE — Plan of Care (Signed)
  Problem: Consults Goal: RH STROKE PATIENT EDUCATION Description: Patient and family will be able to verbalize understanding of how to identify stroke symptoms prior to discharge with minimal assistance. Outcome: Progressing Goal: Diabetes Guidelines if Diabetic/Glucose > 140 Description: If diabetic or lab glucose is > 140 mg/dl - Initiate Diabetes/Hyperglycemia Guidelines & Document Interventions  Patient and family will be able to verbalize signs and symptoms of hyperglycemia with minimal assistance prior to discharge. Outcome: Progressing   Problem: RH BLADDER ELIMINATION Goal: RH STG MANAGE BLADDER WITH ASSISTANCE Description: STG Manage Bladder With supervision Assistance Outcome: Progressing   Problem: RH SKIN INTEGRITY Goal: RH STG MAINTAIN SKIN INTEGRITY WITH ASSISTANCE Description: STG Maintain Skin Integrity With supervision Assistance. Outcome: Progressing   Problem: RH SAFETY Goal: RH STG ADHERE TO SAFETY PRECAUTIONS W/ASSISTANCE/DEVICE Description: STG Adhere to Safety Precautions With supervision Assistance appropriate assistive Device. Outcome: Progressing   Problem: RH PAIN MANAGEMENT Goal: RH STG PAIN MANAGED AT OR BELOW PT'S PAIN GOAL Description: <3 on a 0-10 pain scale Outcome: Progressing   Problem: RH KNOWLEDGE DEFICIT Goal: RH STG INCREASE KNOWLEDGE OF DIABETES Description: Patient will demonstrate knowledge of diabetic medications, dietary restrictions, and follow up care with the MD with min assist from CIR staff. Outcome: Progressing Goal: RH STG INCREASE KNOWLEDGE OF HYPERTENSION Description: Patient will demonstrate knowledge of HTN medications, dietary restrictions, and follow up care with the MD with min assist from CIR staff.  Outcome: Progressing Goal: RH STG INCREASE KNOWLEGDE OF HYPERLIPIDEMIA Description: Patient will demonstrate knowledge of HLD medications, dietary restrictions, and follow up care with the MD with min assist from CIR  staff.  Outcome: Progressing Goal: RH STG INCREASE KNOWLEDGE OF STROKE PROPHYLAXIS Description: Patient will demonstrate increased knowledge of medications used to prevent future strokes with min assist from CIR staff. Outcome: Progressing

## 2019-12-16 NOTE — Progress Notes (Signed)
Occupational Therapy Discharge Summary  Patient Details  Name: Brandon Sanchez MRN: [id number] Date of Birth: [birth year]  Today's Date: 12/16/2019 OT Individual Time: 2542-7062 OT Individual Time Calculation (min): 61 min   Session Note:  Pt up in the recliner to start session.  Had him complete functional mobility down to the dayroom with use of the RW and modified independence.  Educated pt on BUE therex and provided handout.  He was able to return demonstrate completion of bilateral shoulder external rotation with medium resistance orange band as well as for elbow flexion.  One set of 10 reps was completed so pt would be comfortable.  Other exercises for the shoulders and elbow were reviewed on the sheet, but pt was familiar with them from yesterday's session, to they were not attempted.  Next, had pt work in standing with use of the Wii balance board.  He was able to work on ankle and hip strategies in standing with close supervision.  He maintained standing for 10 mins before needing a rest break.  Pt still exhibits slower balance reactions than normal with use of the board, requiring need for an assistive device for mobility.  Finished session with return to the room and pt left sitting up in the recliner with call button and phone in reach.  Pt was made modified independent in the room in anticipation of discharge home tomorrow.    Patient has met 11 of 11 long term goals due to improved activity tolerance, improved balance and ability to compensate for deficits.  Patient to discharge at overall Modified Independent level.  Patient's care partner is independent to provide the necessary physical assistance at discharge.    Reasons goals not met: NA  Recommendation:  Feel pt does not warrant further OT treatment at this time.   Equipment: 3:1  Reasons for discharge: treatment goals met and discharge from hospital  Patient/family agrees with progress made and goals achieved: Yes  OT  Discharge Precautions/Restrictions  Precautions Precautions: Fall Precaution Comments: L hemiparesis Restrictions Weight Bearing Restrictions: No  Pain  See Pain FLow sheet  ADL ADL Eating: Independent Where Assessed-Eating: Chair Grooming: Independent Where Assessed-Grooming: Standing at sink Upper Body Bathing: Independent Where Assessed-Upper Body Bathing: Shower Lower Body Bathing: Modified independent Where Assessed-Lower Body Bathing: Shower Upper Body Dressing: Independent Where Assessed-Upper Body Dressing: Chair Lower Body Dressing: Modified independent Where Assessed-Lower Body Dressing: Chair Toileting: Modified independent Where Assessed-Toileting: Glass blower/designer: Diplomatic Services operational officer Method: Counselling psychologist: Energy manager: Medical sales representative Method: Optometrist: Facilities manager: Modified independent Social research officer, government Method: Heritage manager: Civil engineer, contracting with back Vision Baseline Vision/History: Wears glasses Wears Glasses: At all times Patient Visual Report: No change from baseline Perception  Perception: Within Functional Limits Praxis Praxis: Intact Cognition Overall Cognitive Status: Within Functional Limits for tasks assessed Arousal/Alertness: Awake/alert Orientation Level: Oriented X4 Attention: Focused;Sustained Focused Attention: Appears intact Sustained Attention: Appears intact Memory: Appears intact Awareness: Appears intact Problem Solving: Appears intact Executive Function: Reasoning;Sequencing Sequencing: Appears intact Safety/Judgment: Appears intact Sensation Sensation Light Touch: Appears Intact Hot/Cold: Appears Intact Proprioception: Appears Intact Stereognosis: Appears Intact Coordination Gross Motor Movements are Fluid and Coordinated: Yes Fine Motor Movements are  Fluid and Coordinated: Yes Finger Nose Finger Test: [facility] Motor  Motor Motor - Discharge Observations: Generalized weakness overall Mobility  Bed Mobility Bed Mobility: Supine to Sit;Sit to Supine Supine to Sit: Independent Sit to Supine: Independent Transfers  Sit to Stand: Independent with assistive device Stand to Sit: Independent with assistive device  Trunk/Postural Assessment  Cervical Assessment Cervical Assessment: Exceptions to WFL(forward head) Thoracic Assessment Thoracic Assessment: Exceptions to WFL(thoracic rounding with scapular abduction at rest) Lumbar Assessment Lumbar Assessment: Exceptions to WFL(posterior pelvic tilt at rest)  Balance Balance Balance Assessed: Yes Static Sitting Balance Static Sitting - Balance Support: Feet supported Static Sitting - Level of Assistance: 7: Independent Dynamic Sitting Balance Dynamic Sitting - Level of Assistance: 7: Independent Static Standing Balance Static Standing - Balance Support: During functional activity Static Standing - Level of Assistance: 6: Modified independent (Device/Increase time) Dynamic Standing Balance Dynamic Standing - Level of Assistance: 6: Modified independent (Device/Increase time) Extremity/Trunk Assessment RUE Assessment RUE Assessment: Within Functional Limits LUE Assessment LUE Assessment: Within Functional Limits   Morning Halberg OTR/L 12/16/2019, 12:40 PM

## 2019-12-16 NOTE — Discharge Summary (Signed)
Physician Discharge Summary  Patient ID: Brandon Sanchez MRN: [id number] DOB/AGE: 12/12/1945 74 y.o.  Admit date: 12/10/2019 Discharge date: 12/17/2019  Discharge Diagnoses:  Principal Problem:   Cerebral embolism with cerebral infarction Active Problems:   Uncontrolled type 2 diabetes mellitus with hypoglycemia without coma, with long-term current use of insulin (HCC)   Essential hypertension   Pain in toes of both feet   Anemia   Discharged Condition: Stable   Significant Diagnostic Studies: N/A   Labs:  Basic Metabolic Panel: Recent Labs  Lab 12/10/19 0433 12/11/19 0501 12/13/19 0657  NA 143 143 142  K 3.8 3.3* 3.6  CL 106 106 107  CO2 28 29 27   GLUCOSE 77 82 98  BUN 11 10 10   CREATININE 0.96 1.04 1.03  CALCIUM 9.0 8.9 8.8*    CBC: Recent Labs  Lab 12/10/19 0433 12/11/19 0501 12/13/19 0657  WBC 8.8 9.7 9.9  NEUTROABS  --  5.2  --   HGB 9.1* 9.1* 9.6*  HCT 30.9* 29.9* 32.2*  MCV 89.6 89.0 89.0  PLT 293 301 289    CBG: Recent Labs  Lab 12/16/19 0628 12/16/19 1134 12/16/19 1640 12/16/19 2117 12/17/19 0601  GLUCAP 81 127* 111* 73 92    Brief HPI:   Brandon Sanchez is a 74 y.o. Rh male with history of T2DM, recent diagnosis of A. 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  2D echo showed EF of 55 to 60% with moderate aortic valve stenosis and mild concentric LVH with grade 3 DD.  Dr. 12/05/2019  felt the stroke was due to large vessel disease likely right CCA mixed plaque and low-dose ASA was added to Eliquis for stroke prevention. Lipitor was increased to 80 mg a day.  Patient had fluctuating left he continued to have functional decline-sided symptoms due to hypertension therefore metoprolol and Lasix were  discontinued.  Patient with resultant left-sided weakness affecting ADLs and mobility.  CIR was recommended due to functional decline.   Hospital Course: Brandon Sanchez was admitted to rehab 12/10/2019 for inpatient therapies to consist of PT and OT at least three hours five days a week. Past admission physiatrist, therapy team and rehab RN have worked together to provide customized collaborative inpatient rehab.  He was maintained on low-dose aspirin with Eliquis for secondary stroke prevention. Blood pressures were monitored on TID basis and have been relatively stable off of Lasix and metoprolol.  Edema lower extremity is at baseline and no respiratory symptoms reported.  His diabetes has been monitored with ac/hs CBG checks and SSI was use prn for tighter BS control.  His blood sugars have been well controlled on 16 units twice daily and he was advised to continue monitoring blood sugars AC at bedtime basis.  If blood sugars start trending over 120, he can titrate up to home dose or follow-up with Dr. Leroy Kennedy for further input.   His heart rate has been monitored on TID basis and has been controlled on amiodarone.  His p.o. intake has been good and he is continent of bowel and bladder.  Follow-up labs showed that transient hypokalemia has resolved and renal status is within normal limits.  CBC revealed  ABLA is slowly improving and platelets are stable.  Iron supplement was added  and recommend repeat CBC in 1 to 2 weeks to monitor for recovery.  He has had issues with constipation and bowel programs has been augmented with good results. He has made gains during rehab stay and is modified independent with use of RW. He will  continue to receive follow up outpatient PT and OT  after discharge   Rehab course: During patient's stay in rehab weekly team conferences were held to monitor patient's progress, set goals and discuss barriers to discharge. At admission, patient required min assist with ADLs and  mobility. He has had improvement in activity tolerance, balance, postural control as well as ability to compensate for deficits. He is able to complete ADL tasks at modified independent level.  He is m encouraged to use his insulin daily odified independent for transfers and to ambulate 56' with RW and requires supervision with cues to ambulate 100' without RW.  His daughter will provide supervision for a few days to help transition to home after discharge.    Disposition: Home  Diet: Heart Healthy/Carb Modified.   Special Instructions: 1.  Monitor blood sugars ac/hs.  2.  No driving or strenuous activity till cleared by MD.  3. Recommend repeat CBC in 1-2 weeks to monitor H/H.  Allergies as of 12/17/2019   No Known Allergies     Medication List    STOP taking these medications   acetaminophen 500 MG tablet Commonly known as: TYLENOL   furosemide 40 MG tablet Commonly known as: LASIX   Glucosamine Chondr 1500 Complx Caps   potassium chloride 10 MEQ tablet Commonly known as: KLOR-CON     TAKE these medications   albuterol 108 (90 Base) MCG/ACT inhaler Commonly known as: ProAir HFA 2 puffs every 4 hours as needed only  if your can't catch your breath What changed:   how much to take  how to take this  when to take this  reasons to take this  additional instructions   amiodarone 200 MG tablet Commonly known as: PACERONE Take 1 tablet (200 mg total) by mouth daily.   apixaban 5 MG Tabs tablet Commonly known as: ELIQUIS Take 1 tablet (5 mg total) by mouth 2 (two) times daily.   aspirin 81 MG EC tablet Take 1 tablet (81 mg total) by mouth daily.   atorvastatin 80 MG tablet Commonly known as: LIPITOR Take 1 tablet (80 mg total) by mouth every evening. What changed:   medication strength  how much to take   Breo Ellipta 200-25 MCG/INH Aepb Generic drug: fluticasone furoate-vilanterol Inhale 1 puff into the lungs daily as needed (shortness of breath).    insulin aspart protamine- aspart (70-30) 100 UNIT/ML injection Commonly known as: NOVOLOG MIX 70/30 Inject 0.16 mLs (16 Units total) into the skin 2 (two) times daily at 8 am and 10 pm. Dose varies - Prescription is for 20 units with each meal (twice daily). However administration depends on what types of meals being taken.   iron polysaccharides 150 MG capsule Commonly known as: NIFEREX Take 1 capsule (150 mg total) by mouth daily.   multivitamin with minerals Tabs tablet Take 1 tablet by mouth daily.   omeprazole 40 MG capsule Commonly known as: PRILOSEC Take 40 mg by mouth daily as needed (heart burn).   OneTouch Verio test strip Generic drug: glucose blood 1 each by Other route in the morning, at noon, and at bedtime.   polyethylene glycol 17 g packet Commonly known as: MIRALAX / GLYCOLAX Take 17 g by  mouth daily.   PreviDent 5000 Booster Plus 1.1 % Pste Generic drug: Sodium Fluoride Place 1 application onto teeth in the morning and at bedtime.   senna-docusate 8.6-50 MG tablet Commonly known as: Senokot-S Take 2 tablets by mouth 2 (two) times daily.   sertraline 100 MG tablet Commonly known as: ZOLOFT Take 100 mg by mouth daily.      Follow-up Information    Kirsteins, Luanna Salk, MD Follow up.   Specialty: Physical Medicine and Rehabilitation Why: Office will call you with follow up appointment Contact information: [place] [address] [phone]        [place]. Call.   Why: for follow up appointment Contact information: [place] [address] [phone]       Deland Pretty, MD. Call on 12/20/2019.   Specialty: Internal Medicine Why: for post hospital follow up appointment Contact information: [place] [address] [phone]        Buford Dresser, MD Follow up on 01/05/2020.   Specialty: Cardiology Why:  Appointment at 1:20 pm Contact information: [place] [address] [phone]        Constance Haw, MD Follow up on 12/21/2019.   Specialty: Cardiology Why: Virtual visit appt 2:30 pm Contact information: [place]  [address] [phone]           Signed: Bary Leriche 12/19/2019, 11:33 PM

## 2019-12-16 NOTE — Progress Notes (Signed)
Team Conference Report to Patient/Family  Team Conference discussion was reviewed with the patient, including goals, any changes in plan of care and target discharge date.  Patient expressed understanding and is in agreement.  The patient has a target discharge date of 12/17/19. Follow up OP PT is scheduled and no family education is required. Therapy to speak with daughter via phone if she has questions. DME ordered and patient aware of out of pocket charges for equipment not covered by insurance.  Brandon Sanchez 12/16/2019, 12:28 PM

## 2019-12-16 NOTE — Progress Notes (Signed)
Physical Therapy Session Note  Patient Details  Name: Brandon Sanchez MRN: [id number] Date of Birth: [birth year]  Today's Date: 12/16/2019 PT Individual Time: 1030-1115 PT Individual Time Calculation (min): 45 min   Short Term Goals: Week 1:  PT Short Term Goal 1 (Week 1): STGs = LTGs Week 2:    Week 3:     Skilled Therapeutic Interventions/Progress Updates:    PAIN pt denies pain Pt initially oob in recliner and agreeble to treatment session focusing on balance and functional mobility.   STS and Gait 536ft+ from pt room to ortho gym w/supervision, occasional mild decreased clearance of LLE thru swing, pt requires seated recovery due to dyspnea.  Car transfer performed using RW w/verbal cues and w/supervision  Ramp ascends/descends w/RW w/ supervision  Gt 54ft thru mulch/Uneven surface performed w/ supervision w/RW  Pt rested in sitting for recovery.  Gait 234ft w/RW w/supervision, turn/sit to mat w/supervision.  Picking up object from floor - pt performs using RW for balance and supervision.  Dynamic Balance: Sidestepping 5 steps to L/R repeated x 2 w/close supervision/no AD Turning 360 - slowly but w/supervision Backing x 5 steps w/cga , short/slow steps, pt describes feeling unsteady w/this.  Gait 175 w/RW w/supervision to room, turn sit transfer to recliner w/supervision. Pt left oob in recliner w/chair alarm set and needs in reach.  Using RW pt performs all gait tasks w/supervision only.  Therapy Documentation Precautions:  Precautions Precautions: Fall Precaution Comments: L hemiparesis Restrictions Weight Bearing Restrictions: No    Therapy/Group: Individual Therapy  Rada Hay, PT   [name] 12/16/2019, 12:25 PM

## 2019-12-16 NOTE — Progress Notes (Signed)
[facility] PHYSICAL MEDICINE & REHABILITATION PROGRESS NOTE   Subjective/Complaints:  No issues overnite per pt.  + constipation , hard to get stool out but stool is not hard   ROS:  Pt denies CP, SOB, N/V/D    Objective:   No results found. No results for input(s): WBC, HGB, HCT, PLT in the last 72 hours. No results for input(s): NA, K, CL, CO2, GLUCOSE, BUN, CREATININE, CALCIUM in the last 72 hours.  Intake/Output Summary (Last 24 hours) at 12/16/2019 0729 Last data filed at 12/15/2019 1826 Gross per 24 hour  Intake 708 ml  Output --  Net 708 ml     Physical Exam: Vital Signs Blood pressure 132/67, pulse 77, temperature 97.6 F (36.4 C), temperature source Oral, resp. rate 20, height 6' 2"  (1.88 m), weight 128.7 kg, SpO2 96 %.   General: No acute distress Mood and affect are appropriate Heart: Regular rate and rhythm no rubs murmurs or extra sounds Lungs: Clear to auscultation, breathing unlabored, no rales or wheezes Abdomen: Positive bowel sounds, soft nontender to palpation, nondistended Extremities: No clubbing, cyanosis, or edema Skin: No evidence of breakdown, no evidence of rash Neurologic: Cranial nerves II through XII intact, motor strength is 5/5 in bilateral deltoid, bicep, tricep, grip, hip flexor, knee extensors, ankle dorsiflexor and plantar flexor Sensory exam normal sensation to light touch and proprioception in bilateral upper and lower extremities Cerebellar exam normal finger to nose to finger as well as heel to shin in bilateral upper and lower extremities Musculoskeletal: Full range of motion in all 4 extremities. No joint swelling  Trace pedal edema     Assessment/Plan: 1. 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  Physiatrist is providing close team supervision and 24 hour management of active medical problems listed  below.  Physiatrist and rehab team continue to assess barriers to discharge/monitor patient progress toward functional and medical goals  Care Tool:  Bathing    Body parts bathed by patient: Right arm, Left arm, Chest, Abdomen, Front perineal area, Buttocks, Right upper leg, Face, Left lower leg, Right lower leg, Left upper leg   Body parts bathed by helper: Right lower leg, Left lower leg     Bathing assist Assist Level: Supervision/Verbal cueing     Upper Body Dressing/Undressing Upper body dressing   What is the patient wearing?: Pull over shirt    Upper body assist Assist Level: Supervision/Verbal cueing    Lower Body Dressing/Undressing Lower body dressing      What is the patient wearing?: Underwear/pull up, Pants     Lower body assist Assist for lower body dressing: Minimal Assistance - Patient > 75%     Toileting Toileting Toileting Activity did not occur (Clothing management and hygiene only): N/A (no void or bm)(condom cath in place, no BM)  Toileting assist Assist for toileting: Contact Guard/Touching assist     Transfers Chair/bed transfer  Transfers assist     Chair/bed transfer assist level: Supervision/Verbal cueing     Locomotion Ambulation   Ambulation assist      Assist level: Supervision/Verbal cueing Assistive device: Walker-rolling Max distance: 200   Walk 10 feet activity   Assist     Assist level: Supervision/Verbal cueing Assistive device: Walker-rolling   Walk 50 feet activity   Assist    Assist level: Supervision/Verbal cueing Assistive device: Walker-rolling    Walk 150 feet activity   Assist  Assist level: Supervision/Verbal cueing Assistive device: Walker-rolling    Walk 10 feet on uneven surface  activity   Assist     Assist level: Contact Guard/Touching assist(Per report ) Assistive device: Aeronautical engineer Will patient use wheelchair at discharge?: No              Wheelchair 50 feet with 2 turns activity    Assist            Wheelchair 150 feet activity     Assist          Blood pressure 132/67, pulse 77, temperature 97.6 F (36.4 C), temperature source Oral, resp. rate 20, height 6' 2"  (1.88 m), weight 128.7 kg, SpO2 96 %.  Medical Problem List and Plan: 1.  Left hemiparesis, deficits with endurance, and self-care secondary to acute right basal ganglia/CR infarct.             -patient may shower             -ELOS/Goals:Plan D/C in am with OP PT, OT  Team conference today please see physician documentation under team conference tab, met with team  to discuss problems,progress, and goals. Formulized individual treatment plan based on medical history, underlying problem and comorbidities.  2.  Antithrombotics: -DVT/anticoagulation:  Pharmaceutical: Other (comment)Eliquis              -antiplatelet therapy: Low dose ASA 3. Pain Management: Tylenol prn effective for total pain.              Monitor with increased ambulation 4. Mood: LCSW to follow for evaluation and support.              -antipsychotic agents: NA 5. Neuropsych: This patient is capable of making decisions on his own behalf. 6. Skin/Wound Care: Routine pressure relief measures.  7. Fluids/Electrolytes/Nutrition: Monitor I/Os.  CMP ordered. 8. HTN: Monitor BP tid--continue to hold lasix and metoprolol for now.         Vitals:   12/15/19 2021 12/16/19 0352  BP: (!) 119/59 132/67  Pulse: 69 77  Resp: 16 20  Temp: 97.8 F (36.6 C) 97.6 F (36.4 C)  SpO2: 98% 96%  controlled  4/29 9. T2DM with hyperglycemia: Intake has been good--will continue to monitor BS ac/hs. Continue to titrate 70/30 insulin--used 20 units bid depending on BS (per Dr. Chalmers Cater)            CBG (last 3)  Recent Labs    12/15/19 1643 12/15/19 2117 12/16/19 0628  GLUCAP 96 84 81  Controlled 4/29 14 units of 70/30 insulin BID  10. PAF: Monitor heart rate tid--continue amiodarone. Off  Toprol at this time.             Has f/u appt with cardiology on 5/17 11. H/o depression: Mood stable on Zoloft. 12. Constipation: Continue senna S bid. Increase to 2 tabs              Adjust bowel meds as necessary  4/26- doing better- con't regimen. 13. GERD: Continue PPI.  32. H/o asthma: Resume Breo.  No respiratory distress or wheezing #15.  Likely venous stasis causing mild lower extremity swelling.  May use support hose  16.  Normochromic normocytic anemia we will check guaiacs LOS: 6 days A FACE TO FACE EVALUATION WAS PERFORMED  Charlett Blake 12/16/2019, 7:29 AM

## 2019-12-16 NOTE — Progress Notes (Signed)
Physical Therapy Session Note  Patient Details  Name: Brandon Sanchez MRN: [id number] Date of Birth: August 25, 1945  Today's Date: 12/16/2019 PT Individual Time: 6270-3500 PT Individual Time Calculation (min): 39 min   Short Term Goals: Week 1:  PT Short Term Goal 1 (Week 1): STGs = LTGs  Skilled Therapeutic Interventions/Progress Updates:    Session focused on d/c planning education, functional mobility training with and without RW (to increase challenge for balance), and NMR to address dynamic standing balance. Pt performs basic transfers and gait with RW at modified independent level throughout session of distances about 150'. Supervision for gait without AD shorter distances within the gym space. On compliant surface focused on NMR for strengthening and balance re-training with minimal UE support including standing heel raises x 15 reps, standing toe raises x 15 reps, mini squats within limited range x 15 reps, step ups x 10 reps, and lateral step ups x 10 reps each side (all on compliant surface). D/c planning discussed and educated on role and process of OPPT as well as activities he can do in his home (HEP).   Therapy Documentation Precautions:  Precautions Precautions: Fall Precaution Comments: L hemiparesis Restrictions Weight Bearing Restrictions: No   Pain:  Denies pain. Reports some intermittent R knee pain which he reports is chronic. No intervention needed.  Therapy/Group: Individual Therapy  Karolee Stamps Darrol Poke, PT, DPT, CBIS  12/16/2019, 1:45 PM

## 2019-12-16 NOTE — Care Management (Signed)
   The overall goal for the admission was met for:   Discharge location: Home with Daughter to assist in patient's home.  Length of Stay: 7 days with discharge 12/17/19  Discharge activity level:Pt performs basic transfers and gait with RW at modified independent level throughout session of distances about 150'. Supervision for gait without AD shorter distances.  Home/community participation: Clinical research associate provided included: MD, RD, PT, OT, SLP, RN, CM, TR, Pharmacy, Neuropsych and SW  Financial Services: Medicare and Private Insurance: [place] [employer]  Follow-up services arranged: Outpatient: PT, DME: RWx2, 3n1 and Patient/Family has no preference for HH/DME agencies  Comments (or additional information):  Dec 20, 2019 @ 0845 a.m.  [facility]; [place], [place] [address] [phone]  Patient/Family verbalized understanding of follow-up arrangements: Yes  Individual responsible for coordination of the follow-up plan: Self: [phone] Daughter Tanya Marvin :[phone]  Confirmed correct DME delivered: Margarito Liner 12/16/2019    Margarito Liner

## 2019-12-16 NOTE — Progress Notes (Signed)
Physical Therapy Discharge Summary  Patient Details  Name: Brandon Sanchez MRN: [id number] Date of Birth: [birth year]  Today's Date: 12/16/2019 PT Individual Time: 6578-4696 PT Individual Time Calculation (min): 55 min    Patient has met 8 of 8 long term goals due to improved activity tolerance, improved balance, improved postural control, increased strength and improved coordination.  Patient to discharge at an ambulatory level Modified Independent.   Patient's care partner is independent to provide the necessary physical assistance at discharge.  Reasons goals not met: NA  Recommendation:  Patient will benefit from ongoing skilled PT services in outpatient setting to continue to advance safe functional mobility, address ongoing impairments in strength, balance, endurance, and minimize fall risk.  Equipment: 2 RWs  Reasons for discharge: treatment goals met and discharge from hospital  Patient/family agrees with progress made and goals achieved: Yes   Skilled Therapeutic Interventions:  Pt received seated in recliner and agreeable to therapy. Denies pain. Performs sit to stand transfer and stand step to bed independently. Sit<>supine independent without use of bed features. Pt ambulates 150' with RW and mod(I).   Pt performs BERG balance assessment as detailed below. PT educates on performing tandem stance in corner of house as part of HEP to improve balance. Pt takes rest breaks as needed throughout assessment.  Pt ambulates 80' with RW and mod(I) and final 100' back to room without AD to challenge dynamic balance, and with supervision from PT for verbal cues for upright gaze to improve balance and posture.  PT educates on proper sizing of RW and addresses pt's concerns regarding DC. Pt left seated in recliner with all needs within reach.   PT Discharge Precautions/Restrictions Precautions Precautions: Fall Precaution Comments: L hemiparesis Restrictions Weight Bearing  Restrictions: No PVision/Perception  Perception Perception: Within Functional Limits Praxis Praxis: Intact  Cognition Overall Cognitive Status: Within Functional Limits for tasks assessed Arousal/Alertness: Awake/alert Orientation Level: Oriented X4 Attention: Focused;Sustained Focused Attention: Appears intact Sustained Attention: Appears intact Memory: Appears intact Awareness: Appears intact Problem Solving: Appears intact Executive Function: Reasoning;Sequencing Sequencing: Appears intact Safety/Judgment: Appears intact Sensation Sensation Light Touch: Appears Intact Hot/Cold: Appears Intact Proprioception: Appears Intact Stereognosis: Appears Intact Coordination Gross Motor Movements are Fluid and Coordinated: Yes Fine Motor Movements are Fluid and Coordinated: Yes Finger Nose Finger Test: [facility] Motor  Motor Motor - Discharge Observations: Generalized weakness overall  Mobility Bed Mobility Bed Mobility: Supine to Sit;Sit to Supine Supine to Sit: Independent Sit to Supine: Independent Transfers Transfers: Sit to Stand;Stand to Sit;Stand Pivot Transfers Sit to Stand: Independent with assistive device Stand to Sit: Independent with assistive device Stand Pivot Transfers: Independent with assistive device Transfer (Assistive device): Rolling walker Locomotion  Gait Ambulation: Yes Gait Assistance: Independent with assistive device Gait Distance (Feet): 150 Feet Assistive device: Rolling walker Gait Gait: Yes Gait Pattern: Impaired Gait Pattern: Trunk flexed Gait velocity: decreased Stairs / Additional Locomotion Stairs: Yes Stairs Assistance: Independent with assistive device Stair Management Technique: Two rails Number of Stairs: 17 Height of Stairs: 6 Ramp: Independent with assistive device Curb: Independent with assistive device Wheelchair Mobility Wheelchair Mobility: No  Trunk/Postural Assessment  Cervical Assessment Cervical Assessment:  Exceptions to WFL(forward head) Thoracic Assessment Thoracic Assessment: Exceptions to WFL(thoracic rounding with scapular abduction at rest) Lumbar Assessment Lumbar Assessment: Exceptions to WFL(posterior pelvic tilt at rest)  Balance Balance Balance Assessed: Yes Standardized Balance Assessment Standardized Balance Assessment: Berg Balance Test Berg Balance Test Sit to Stand: Able to stand without using hands and stabilize independently  Standing Unsupported: Able to stand safely 2 minutes Sitting with Back Unsupported but Feet Supported on Floor or Stool: Able to sit safely and securely 2 minutes Stand to Sit: Sits safely with minimal use of hands Transfers: Able to transfer safely, minor use of hands Standing Unsupported with Eyes Closed: Able to stand 10 seconds safely Standing Ubsupported with Feet Together: Able to place feet together independently and stand 1 minute safely From Standing, Reach Forward with Outstretched Arm: Can reach confidently >25 cm (10") From Standing Position, Pick up Object from Floor: Able to pick up shoe safely and easily From Standing Position, Turn to Look Behind Over each Shoulder: Looks behind from both sides and weight shifts well Turn 360 Degrees: Able to turn 360 degrees safely but slowly Standing Unsupported, Alternately Place Feet on Step/Stool: Able to stand independently and complete 8 steps >20 seconds Standing Unsupported, One Foot in Front: Able to plae foot ahead of the other independently and hold 30 seconds Standing on One Leg: Able to lift leg independently and hold 5-10 seconds Total Score: 51 Static Sitting Balance Static Sitting - Balance Support: Feet supported Static Sitting - Level of Assistance: 7: Independent Dynamic Sitting Balance Dynamic Sitting - Level of Assistance: 7: Independent Static Standing Balance Static Standing - Balance Support: During functional activity Static Standing - Level of Assistance: 6: Modified  independent (Device/Increase time) Dynamic Standing Balance Dynamic Standing - Balance Support: Bilateral upper extremity supported;During functional activity Dynamic Standing - Level of Assistance: 6: Modified independent (Device/Increase time) Extremity Assessment  RUE Assessment RUE Assessment: Within Functional Limits LUE Assessment LUE Assessment: Within Functional Limits RLE Assessment General Strength Comments: [facility] LLE Assessment General Strength Comments: WFL    Breck Coons, PT, DPT 12/16/2019, 3:53 PM

## 2019-12-17 ENCOUNTER — Encounter (HOSPITAL_COMMUNITY): Payer: Federal, State, Local not specified - PPO | Admitting: Occupational Therapy

## 2019-12-17 ENCOUNTER — Other Ambulatory Visit: Payer: Self-pay | Admitting: Physical Medicine and Rehabilitation

## 2019-12-17 ENCOUNTER — Ambulatory Visit (HOSPITAL_COMMUNITY): Payer: Federal, State, Local not specified - PPO | Admitting: Physical Therapy

## 2019-12-17 DIAGNOSIS — I639 Cerebral infarction, unspecified: Secondary | ICD-10-CM

## 2019-12-17 LAB — GLUCOSE, CAPILLARY: Glucose-Capillary: 92 mg/dL (ref 70–99)

## 2019-12-17 MED ORDER — POLYSACCHARIDE IRON COMPLEX 150 MG PO CAPS: 150.0000 mg | ORAL_CAPSULE | Freq: Every day | ORAL | 1 refills | Status: DC

## 2019-12-17 MED ORDER — ATORVASTATIN CALCIUM 80 MG PO TABS: 40.0000 mg | ORAL_TABLET | Freq: Every evening | ORAL | 0 refills | Status: DC

## 2019-12-17 MED ORDER — SENNOSIDES-DOCUSATE SODIUM 8.6-50 MG PO TABS: 2.0000 | ORAL_TABLET | Freq: Two times a day (BID) | ORAL | 0 refills | Status: DC

## 2019-12-17 MED ORDER — ATORVASTATIN CALCIUM 80 MG PO TABS: 80.0000 mg | ORAL_TABLET | Freq: Every evening | ORAL | 0 refills | Status: AC

## 2019-12-17 NOTE — Progress Notes (Signed)
[facility] PHYSICAL MEDICINE & REHABILITATION PROGRESS NOTE   Subjective/Complaints:  No issues overnight we discussed no driving until patient seen in office.  No other problems in therapy.  ROS:  Pt denies CP, SOB, N/V/D    Objective:   No results found. No results for input(s): WBC, HGB, HCT, PLT in the last 72 hours. No results for input(s): NA, K, CL, CO2, GLUCOSE, BUN, CREATININE, CALCIUM in the last 72 hours.  Intake/Output Summary (Last 24 hours) at 12/17/2019 0903 Last data filed at 12/16/2019 1828 Gross per 24 hour  Intake 596 ml  Output -  Net 596 ml     Physical Exam: Vital Signs Blood pressure 116/60, pulse 69, temperature 98.2 F (36.8 C), resp. rate 16, height _0  (1.88 m), weight 128.7 kg, SpO2 96 %.    General: No acute distress Mood and affect are appropriate Heart: Regular rate and rhythm no rubs murmurs or extra sounds Lungs: Clear to auscultation, breathing unlabored, no rales or wheezes Abdomen: Positive bowel sounds, soft nontender to palpation, nondistended Extremities: No clubbing, cyanosis, or edema  Neurologic: Cranial nerves II through XII intact, motor strength is 5/5 in bilateral deltoid, bicep, tricep, grip, hip flexor, knee extensors, ankle dorsiflexor and plantar flexor No evidence of dysdiadochokinesis with rapid alternating movements of supination pronation bilateral upper extremities Trace pedal edema     Assessment/Plan:  1. Functional deficits secondary to right basal ganglia infarct  Stable for D/C today F/u PCP in 3-4 weeks F/u PM&R 2 weeks See D/C summary  See D/C instructions  Care Tool:  Bathing    Body parts bathed by patient: Right arm, Left arm, Chest, Abdomen, Front perineal area, Buttocks, Right upper leg, Face, Left lower leg, Right lower leg, Left upper leg   Body parts bathed by helper: Right lower leg, Left lower leg     Bathing assist Assist Level: Independent with assistive device     Upper Body  Dressing/Undressing Upper body dressing   What is the patient wearing?: Pull over shirt    Upper body assist Assist Level: Independent    Lower Body Dressing/Undressing Lower body dressing      What is the patient wearing?: Underwear/pull up, Pants     Lower body assist Assist for lower body dressing: Independent with assitive device     Toileting Toileting Toileting Activity did not occur (Clothing management and hygiene only): N/A (no void or bm)(condom cath in place, no BM)  Toileting assist Assist for toileting: Independent with assistive device     Transfers Chair/bed transfer  Transfers assist     Chair/bed transfer assist level: Independent with assistive device Chair/bed transfer assistive device: Programmer, multimedia   Ambulation assist      Assist level: Independent with assistive device Assistive device: Walker-rolling Max distance: 150'   Walk 10 feet activity   Assist     Assist level: Independent with assistive device Assistive device: Walker-rolling   Walk 50 feet activity   Assist    Assist level: Independent with assistive device Assistive device: Walker-rolling    Walk 150 feet activity   Assist    Assist level: Independent with assistive device Assistive device: Walker-rolling    Walk 10 feet on uneven surface  activity   Assist     Assist level: Supervision/Verbal cueing Assistive device: Walker-rolling   Wheelchair     Assist Will patient use wheelchair at discharge?: No  Wheelchair 50 feet with 2 turns activity    Assist            Wheelchair 150 feet activity     Assist          Blood pressure 116/60, pulse 69, temperature 98.2 F (36.8 C), resp. rate 16, height _0  (1.88 m), weight 128.7 kg, SpO2 96 %.  Medical Problem List and Plan: 1.  Left hemiparesis, deficits with endurance, and self-care secondary to acute right basal ganglia/CR infarct.              -patient may shower             -ELOS/Goals:Plan D/C in am with OP PT, OT  Team conference today please see physician documentation under team conference tab, met with team  to discuss problems,progress, and goals. Formulized individual treatment plan based on medical history, underlying problem and comorbidities.  2.  Antithrombotics: -DVT/anticoagulation:  Pharmaceutical: Other (comment)Eliquis              -antiplatelet therapy: Low dose ASA 3. Pain Management: Tylenol prn effective for total pain.              Monitor with increased ambulation 4. Mood: LCSW to follow for evaluation and support.              -antipsychotic agents: NA 5. Neuropsych: This patient is capable of making decisions on his own behalf. 6. Skin/Wound Care: Routine pressure relief measures.  7. Fluids/Electrolytes/Nutrition: Monitor I/Os.  CMP ordered. 8. HTN: Monitor BP tid--continue to hold lasix and metoprolol for now.         Vitals:   12/17/19 0600 12/17/19 0827  BP: 116/60   Pulse: 69   Resp: 16   Temp: 98.2 F (36.8 C)   SpO2: 96% 96%  controlled  4/30 9. T2DM with hyperglycemia: Intake has been good--will continue to monitor BS ac/hs. Continue to titrate 70/30 insulin--used 20 units bid depending on BS (per Dr. Chalmers Cater)            CBG (last 3)  Recent Labs    12/16/19 1640 12/16/19 2117 12/17/19 0601  GLUCAP 111* 73 92  Controlled 4/30 14 units of 70/30 insulin BID  10. PAF: Monitor heart rate tid--continue amiodarone. Off Toprol at this time.             Has f/u appt with cardiology on 5/17 11. H/o depression: Mood stable on Zoloft. 12. Constipation: Continue senna S bid. Increase to 2 tabs              Adjust bowel meds as necessary  4/26- doing better- con't regimen. 13. GERD: Continue PPI.  27. H/o asthma: Resume Breo.  No respiratory distress or wheezing #15.  Likely venous stasis causing mild lower extremity swelling.  May use support hose  16.  Normochromic normocytic anemia we  will check guaiacs LOS: 7 days A FACE TO FACE EVALUATION WAS PERFORMED  Charlett Blake 12/17/2019, 9:03 AM

## 2019-12-17 NOTE — Discharge Instructions (Signed)
Inpatient Rehab Discharge Instructions  Brandon Sanchez Discharge date and time:  12/17/19  Activities/Precautions/ Functional Status: Activity: no lifting, driving, or strenuous exercise  till cleared by MD Diet: cardiac diet and diabetic diet Wound Care: none needed    Functional status:  ___ No restrictions     ___ Walk up steps independently ___ 24/7 supervision/assistance   ___ Walk up steps with assistance _X__ Intermittent supervision/assistance  ___ Bathe/dress independently ___ Walk with walker     ___ Bathe/dress with assistance ___ Walk Independently    _X__ Shower independently ___ Walk with assistance    ___ Shower with assistance _X__ No alcohol     ___ Return to work/school ________  COMMUNITY REFERRALS UPON DISCHARGE:  Outpatient: PT  Agency: [facility] Phone:[phone]  Appointment Date/Time:Monday, Dec 20, 2019 @ 08:45 a.m.  Medical Equipment/Items Ordered:RWx2 and 3n1 commode  Agency/Supplier:[facility]   Special Instructions: 1. Monitor blood sugars before meals and at bedtime. Use sliding scale as per home regimen   STROKE/TIA DISCHARGE INSTRUCTIONS SMOKING Cigarette smoking nearly doubles your risk of having a stroke & is the single most alterable risk factor  If you smoke or have smoked in the last 12 months, you are advised to quit smoking for your health.  Most of the excess cardiovascular risk related to smoking disappears within a year of stopping.  Ask you doctor about anti-smoking medications  [place] Quit Line: 1-800-QUIT NOW  Free Smoking Cessation Classes [phone]  CHOLESTEROL Know your levels; limit fat & cholesterol in your diet  Lipid Panel     Component Value Date/Time   CHOL 96 12/04/2019 0503   TRIG 59 12/04/2019 0503   HDL 32 (L) 12/04/2019 0503   CHOLHDL 3.0 12/04/2019 0503   VLDL 12 12/04/2019 0503   LDLCALC 52 12/04/2019 0503      Many patients benefit from  treatment even if their cholesterol is at goal.  Goal: Total Cholesterol (CHOL) less than 160  Goal:  Triglycerides (TRIG) less than 150  Goal:  HDL greater than 40  Goal:  LDL (LDLCALC) less than 100   BLOOD PRESSURE American Stroke Association blood pressure target is less that 120/80 mm/Hg  Your discharge blood pressure is:  BP: 132/67  Monitor your blood pressure  Limit your salt and alcohol intake  Many individuals will require more than one medication for high blood pressure  DIABETES (A1c is a blood sugar average for last 3 months) Goal HGBA1c is under 7% (HBGA1c is blood sugar average for last 3 months)  Diabetes:     Lab Results  Component Value Date   HGBA1C 7.6 (H) 12/04/2019     Your HGBA1c can be lowered with medications, healthy diet, and exercise.  Check your blood sugar as directed by your physician  Call your physician if you experience unexplained or low blood sugars.  PHYSICAL ACTIVITY/REHABILITATION Goal is 30 minutes at least 4 days per week  Activity: Increase activity slowly, and No driving, Therapies: see above Return to work: N/A  Activity decreases your risk of heart attack and stroke and makes your heart stronger.  It helps control your weight and blood pressure; helps you relax and can improve your mood.  Participate in a regular exercise program.  Talk with your doctor about the best form of exercise for you (dancing, walking, swimming, cycling).  DIET/WEIGHT Goal is to maintain a healthy weight  Your discharge diet is:  Diet Order  Diet heart healthy/carb modified Room service appropriate? Yes; Fluid consistency: Thin  Diet effective now             liquids Your height is:  Height: 6\' 2"  (188 cm) Your current weight is: 283 lbs Your Body Mass Index (BMI) is:  BMI (Calculated): 36.41  Following the type of diet specifically designed for you will help prevent another stroke.  Your goal weight  is: 194 lbs  Your goal Body  Mass Index (BMI) is 19-24.  Healthy food habits can help reduce 3 risk factors for stroke:  High cholesterol, hypertension, and excess weight.  RESOURCES Stroke/Support Group:  Call [phone]   STROKE EDUCATION PROVIDED/REVIEWED AND GIVEN TO PATIENT Stroke warning signs and symptoms How to activate emergency medical system (call 911). Medications prescribed at discharge. Need for follow-up after discharge. Personal risk factors for stroke. Pneumonia vaccine given:  Flu vaccine given:  My questions have been answered, the writing is legible, and I understand these instructions.  I will adhere to these goals & educational materials that have been provided to me after my discharge from the hospital.     My questions have been answered and I understand these instructions. I will adhere to these goals and the provided educational materials after my discharge from the hospital.  Patient/Caregiver Signature _______________________________ Date __________  Clinician Signature _______________________________________ Date __________  Please bring this form and your medication list with you to all your follow-up doctor's appointments. Information on my medicine - ELIQUIS (apixaban)  This medication education was reviewed with me or my healthcare representative as part of my discharge preparation.    Why was Eliquis prescribed for you? Eliquis was prescribed for you to reduce the risk of a blood clot forming that can cause a stroke if you have a medical condition called atrial fibrillation (a type of irregular heartbeat).  What do You need to know about Eliquis ? Take your Eliquis TWICE DAILY - one tablet in the morning and one tablet in the evening with or without food. If you have difficulty swallowing the tablet whole please discuss with your pharmacist how to take the medication safely.  Take Eliquis exactly as prescribed by your doctor and DO NOT stop taking Eliquis without talking  to the doctor who prescribed the medication.  Stopping may increase your risk of developing a stroke.  Refill your prescription before you run out.  After discharge, you should have regular check-up appointments with your healthcare provider that is prescribing your Eliquis.  In the future your dose may need to be changed if your kidney function or weight changes by a significant amount or as you get older.  What do you do if you miss a dose? If you miss a dose, take it as soon as you remember on the same day and resume taking twice daily.  Do not take more than one dose of ELIQUIS at the same time to make up a missed dose.  Important Safety Information A possible side effect of Eliquis is bleeding. You should call your healthcare provider right away if you experience any of the following: ? Bleeding from an injury or your nose that does not stop. ? Unusual colored urine (red or dark brown) or unusual colored stools (red or black). ? Unusual bruising for unknown reasons. ? A serious fall or if you hit your head (even if there is no bleeding).  Some medicines may interact with Eliquis and might increase your risk of bleeding or clotting  while on Eliquis. To help avoid this, consult your healthcare provider or pharmacist prior to using any new prescription or non-prescription medications, including herbals, vitamins, non-steroidal anti-inflammatory drugs (NSAIDs) and supplements.  This website has more information on Eliquis (apixaban): http://www.eliquis.com/eliquis/home

## 2019-12-17 NOTE — Progress Notes (Signed)
Patient discharged to home with daughter. All belongings pack by patient and daughter. Patient discharged at 1115.

## 2019-12-17 NOTE — Plan of Care (Signed)
  Problem: Consults Goal: RH STROKE PATIENT EDUCATION Description: Patient and family will be able to verbalize understanding of how to identify stroke symptoms prior to discharge with minimal assistance. Outcome: Completed/Met Goal: Diabetes Guidelines if Diabetic/Glucose > 140 Description: If diabetic or lab glucose is > 140 mg/dl - Initiate Diabetes/Hyperglycemia Guidelines & Document Interventions  Patient and family will be able to verbalize signs and symptoms of hyperglycemia with minimal assistance prior to discharge. Outcome: Completed/Met   Problem: RH BLADDER ELIMINATION Goal: RH STG MANAGE BLADDER WITH ASSISTANCE Description: STG Manage Bladder With supervision Assistance Outcome: Completed/Met   Problem: RH SKIN INTEGRITY Goal: RH STG MAINTAIN SKIN INTEGRITY WITH ASSISTANCE Description: STG Maintain Skin Integrity With supervision Assistance. Outcome: Completed/Met   Problem: RH SAFETY Goal: RH STG ADHERE TO SAFETY PRECAUTIONS W/ASSISTANCE/DEVICE Description: STG Adhere to Safety Precautions With supervision Assistance appropriate assistive Device. Outcome: Completed/Met   Problem: RH PAIN MANAGEMENT Goal: RH STG PAIN MANAGED AT OR BELOW PT'S PAIN GOAL Description: <3 on a 0-10 pain scale Outcome: Completed/Met   Problem: RH KNOWLEDGE DEFICIT Goal: RH STG INCREASE KNOWLEDGE OF DIABETES Description: Patient will demonstrate knowledge of diabetic medications, dietary restrictions, and follow up care with the MD with min assist from CIR staff. Outcome: Completed/Met Goal: RH STG INCREASE KNOWLEDGE OF HYPERTENSION Description: Patient will demonstrate knowledge of HTN medications, dietary restrictions, and follow up care with the MD with min assist from [place] staff.  Outcome: Completed/Met Goal: RH STG INCREASE KNOWLEGDE OF HYPERLIPIDEMIA Description: Patient will demonstrate knowledge of HLD medications, dietary restrictions, and follow up care with the MD with min assist  from CIR staff.  Outcome: Completed/Met Goal: RH STG INCREASE KNOWLEDGE OF STROKE PROPHYLAXIS Description: Patient will demonstrate increased knowledge of medications used to prevent future strokes with min assist from CIR staff. Outcome: Completed/Met

## 2019-12-19 ENCOUNTER — Other Ambulatory Visit: Payer: Self-pay | Admitting: Physical Medicine and Rehabilitation

## 2019-12-19 DIAGNOSIS — D649 Anemia, unspecified: Secondary | ICD-10-CM

## 2019-12-20 ENCOUNTER — Other Ambulatory Visit: Payer: Self-pay | Admitting: *Deleted

## 2019-12-20 ENCOUNTER — Other Ambulatory Visit: Payer: Self-pay

## 2019-12-20 ENCOUNTER — Ambulatory Visit: Payer: Medicare Other | Attending: Internal Medicine

## 2019-12-20 VITALS — BP 174/86

## 2019-12-20 DIAGNOSIS — M6281 Muscle weakness (generalized): Secondary | ICD-10-CM | POA: Insufficient documentation

## 2019-12-20 DIAGNOSIS — R2689 Other abnormalities of gait and mobility: Secondary | ICD-10-CM | POA: Insufficient documentation

## 2019-12-20 DIAGNOSIS — I69354 Hemiplegia and hemiparesis following cerebral infarction affecting left non-dominant side: Secondary | ICD-10-CM | POA: Insufficient documentation

## 2019-12-20 NOTE — Therapy (Signed)
[facility] [address] [place], [place], [address] Phone: [phone]   Fax:  [phone]  Physical Therapy Evaluation  Patient Details  Name: Brandon Sanchez MRN: [id number] Date of Birth: 1946-01-18 Referring Provider (PT): Delle Reining   Encounter Date: 12/20/2019  PT End of Session - 12/20/19 0856    Visit Number  1    Number of Visits  17    Date for PT Re-Evaluation  03/19/20   90 day cert, 60 day poc   Authorization Type  medicare so 10th visit progress note, FOTO    PT Start Time  0848    PT Stop Time  0932    PT Time Calculation (min)  44 min    Activity Tolerance  Patient tolerated treatment well    Behavior During Therapy  [facility] for tasks assessed/performed       Past Medical History:  Diagnosis Date  . Allergic rhinitis, cause unspecified   . Asthma   . Cataract cortical, senile, bilateral   . Macular degeneration of left eye   . Other and unspecified hyperlipidemia   . Type II or unspecified type diabetes mellitus without mention of complication, not stated as uncontrolled   . Unspecified essential hypertension     Past Surgical History:  Procedure Laterality Date  . CARDIOVERSION N/A 09/22/2019   Procedure: CARDIOVERSION;  Surgeon: [name], MD;  Location: [facility] ENDOSCOPY;  Service: Endoscopy;  Laterality: N/A;  . CARDIOVERSION N/A 10/18/2019   Procedure: CARDIOVERSION;  Surgeon: Jake Bathe, MD;  Location: [facility] ENDOSCOPY;  Service: Cardiovascular;  Laterality: N/A;  . 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 When pt went to ER he  was having slurred speech and left hemiparesis. Pt repots that he could not move left side at all when occurred. He was in inpatient rehab 4/23 to 4/30. Reports he has had a lot of improvement. Left rehab on walker but has not used since coming home and did not bring it to therapy today. Pt was completely independent prior. 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    Pertinent History  Pt is a 74 yo male presenting with slurred speech and L-sided weakness.  PMH includes: paroxysmal A. 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    Patient Stated Goals  Pt would like to be able to walk further.    Currently in Pain?  Yes    Pain Score  0-No pain    Pain Location  Knee    Pain Orientation  Right    Pain Descriptors / Indicators  Shooting    Pain Onset  More than a month ago    Pain Frequency  Intermittent    Aggravating Factors   sit to stand at times         [facility] PT Assessment - 12/20/19 0859      Assessment   Medical Diagnosis  right basal ganglais/corona radiata infarct    Referring Provider (PT)  Reesa Chew    Onset Date/Surgical Date  12/03/19    Hand Dominance  Right    Prior Therapy  inpatient rehab       Precautions   Precautions  Fall      Balance Screen   Has the patient fallen in the past 6 months  Yes    How many times?  1   at times of stroke   Has the patient had a decrease in activity level because of a fear of falling?   No    Is the patient reluctant to leave their home because of a fear of falling?   No      Home Environment   Living Environment  Private residence    Living Arrangements  Alone    Available Help at Discharge  Family   daughter in town for a week or so   Type of [place] to enter    Entrance Stairs-Number of Steps  8    [place]  Two level;1/2 bath on main level;Bed/bath upstairs    Alternate Level Stairs-Number of Steps  flight    Alternate Level Stairs-Rails  Right    [place] - 2 wheels;Shower seat   bars around toilet   Additional Comments  has been going step-to on steps for some time due to knees      Prior Function   Level of Independence  Independent with community mobility without device   had not been driving due to a-fib so aide helped with drive   Vocation  Retired    Leisure  gardening, reading, sports and movies      Cognition   Overall Cognitive Status  Within Functional Limits for tasks assessed      Observation/Other Assessments-Edema    Edema  --   nonpitting edema lower legs     Sensation   Light Touch  Impaired by gross assessment    Additional Comments  Pt reports some decreased light touch in toes which was there before the stroke      Coordination   Gross Motor Movements are Fluid and Coordinated  Yes   intact RAMs   Fine Motor Movements are Fluid and Coordinated  Yes   intact finger opposition     ROM / Strength   AROM / PROM / Strength  Strength      Strength   Strength Assessment Site  Shoulder;Elbow;Hand;Hip;Knee;Ankle    Right/Left Shoulder  Right;Left    Right Shoulder Flexion  5/5    Left Shoulder Flexion  4+/5    Right/Left Elbow  Right;Left    Right Elbow Flexion  5/5    Right Elbow Extension  5/5    Left Elbow Flexion  4+/5    Left Elbow Extension  4+/5    Right/Left hand  Right;Left    Right Hand Gross Grasp  Functional    Left Hand Gross Grasp  Functional    Right/Left Hip  Right;Left    Right Hip Flexion  5/5    Right Hip ABduction  4+/5    Left Hip Flexion  4/5    Left Hip ABduction  3+/5    Right/Left Knee  Right;Left    Right Knee Flexion  5/5    Right Knee Extension  5/5    Left Knee Flexion  4+/5    Left Knee Extension  4+/5  Right/Left Ankle  Right;Left    Right Ankle Dorsiflexion  5/5    Left Ankle  Dorsiflexion  4+/5      Bed Mobility   Bed Mobility  Supine to Sit;Sit to Supine    Supine to Sit  Independent    Sit to Supine  Independent      Transfers   Transfers  Sit to Stand;Stand to Sit    Sit to Stand  5: Supervision    Stand to Sit  5: Supervision      Ambulation/Gait   Ambulation/Gait  Yes    Ambulation/Gait Assistance  5: Supervision    Ambulation Distance (Feet)  50 Feet    Assistive device  None    Gait Pattern  Step-through pattern;Decreased step length - right;Decreased step length - left;Decreased stride length;Decreased trunk rotation    Ambulation Surface  Level;Indoor    Gait velocity  23.19 sec=0.5653m/s    Stairs  Yes    Stairs Assistance  5: Supervision    Stair Management Technique  Two rails;Step to pattern    Number of Stairs  4    Height of Stairs  6      Standardized Balance Assessment   Standardized Balance Assessment  Timed Up and Go Test      Timed Up and Go Test   TUG  Normal TUG    Normal TUG (seconds)  26                Objective measurements completed on examination: See above findings.              PT Education - 12/20/19 1859    Education Details  PT plan of care. Instructed to monitor BP as was elevated today. Pt reports it has been running well and he will monitor.    Person(s) Educated  Patient    Methods  Explanation    Comprehension  Verbalized understanding       PT Short Term Goals - 12/20/19 1908      PT SHORT TERM GOAL #1   Title  Pt will be independent with initial HEP for strengthening and balance.    Time  4    Period  Weeks    Status  New    Target Date  01/19/20      PT SHORT TERM GOAL #2   Title  FGA will be assessed and LTG written to further assess balance.    Time  4    Period  Weeks    Status  New    Target Date  01/19/20      PT SHORT TERM GOAL #3   Title  Pt will decrease TUG from 26 sec to <20 sec for improved balance and functional mobility.    Baseline  26 sec on 12/20/19     Time  4    Period  Weeks    Status  New    Target Date  01/19/20      PT SHORT TERM GOAL #4   Title  Pt will ambulate > 300' on level surfaces without AD independently for improved household mobility.    Time  4    Period  Weeks    Status  New    Target Date  01/19/20        PT Long Term Goals - 12/20/19 1911      PT LONG TERM GOAL #1   Title  Pt will be independent with progressive HEP for strength,  and balance to continue gains on own.    Time  8    Period  Weeks    Status  New    Target Date  02/18/20      PT LONG TERM GOAL #2   Title  Pt will increase gait speed form 0.73m/s to >0.29m/s for improved gait safety.    Baseline  0.15m/s on 12/20/19    Time  8    Period  Weeks    Status  New    Target Date  02/18/20      PT LONG TERM GOAL #3   Title  Pt will ambulate >500' on varied surfaces independently for improved household mobility.    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 Pt has right hemiparesis. He presents with balance and gait deficits as well. Gait speed of 0.61m/s indicated household ambulator but not safe for community. Pt is walking without AD on level surfaces supervision. Fall risk based on TUG of 26 sec. Pt will benefit from skilled PT to address strength, balance and functional mobility deficits.    Personal Factors and Comorbidities  Comorbidity 3+    Comorbidities  paroxysmal A. 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    Examination-Activity Limitations  Stairs;Locomotion Level;Transfers    Examination-Participation Restrictions  Community  Activity;Yard Work    Conservation officer, historic buildings  Evolving/Moderate complexity    Clinical Decision Making  Moderate    Rehab Potential  Good    PT Frequency  2x / week   plus eval   PT Duration  8 weeks    PT Treatment/Interventions  ADLs/Self Care Home Management;Cryotherapy;Moist Heat;DME Instruction;Gait training;Stair training;Functional mobility training;Therapeutic activities;Neuromuscular re-education;Balance training;Therapeutic exercise;Patient/family education;Passive range of motion;Manual techniques;Vestibular    PT Next Visit Plan  Assess FGA. Initiate strengthening HEP especially left hip abduction, gait training, balance training.    Consulted and Agree with Plan of Care  Patient       Patient will benefit from skilled therapeutic intervention in order to improve the following deficits and impairments:  Abnormal gait, Decreased activity tolerance, Decreased knowledge of use of DME, Decreased strength, Decreased mobility, Impaired sensation, Decreased balance  Visit Diagnosis: Other abnormalities of gait and mobility  Muscle weakness (generalized)  Hemiplegia and hemiparesis following cerebral infarction affecting left non-dominant side [facility])     Problem List Patient Active Problem List   Diagnosis Date Noted  . Anemia 12/19/2019  . Basal ganglia stroke (HCC) 12/10/2019  . Left-sided weakness   . Slow transit constipation   . Pain in toes of both feet   . Cerebral embolism with cerebral infarction 12/04/2019  . CVA (cerebral vascular accident) (HCC) 12/04/2019  . Acute left-sided weakness 12/03/2019  . Mixed diabetic hyperlipidemia associated with type 2 diabetes mellitus (HCC) 12/03/2019  . Paroxysmal atrial fibrillation (HCC)   . Secondary hypercoagulable state (HCC) 09/28/2019  . Atrial  fibrillation with rapid ventricular response (HCC) 09/20/2019  . CAP (community acquired pneumonia) 09/20/2019  . Obesity 05/12/2015  . Uncontrolled type 2 diabetes  mellitus with hypoglycemia without coma, with long-term current use of insulin (HCC) 02/04/2008  . HLD (hyperlipidemia) 02/04/2008  . Essential hypertension 02/04/2008  . ALLERGIC RHINITIS 02/04/2008  . 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 02/04/2008    Ronn Melena, PT, DPT, NCS 12/20/2019, 7:15 PM  [facility] [address] [place], [place], [address] Phone: [phone]   Fax:  [phone]  Name: MEKHAI VENUTO MRN: [id number] Date of Birth: 01-May-1946

## 2019-12-20 NOTE — Patient Outreach (Signed)
[facility]) Care Management  12/20/2019  DONNA SILVERMAN 1946-02-06 [id number]   Noted that member was discharged from inpatient rehab on 4/30. Call placed to member for follow up, state this is not a good time to talk.  Request made for member to call this care manager back at his earliest convenience, if no call back will follow up within the next 3-4 business days.  Kemper Durie, [place], MSN [facility] Care Management  [facility] Hights Manager [phone]

## 2019-12-21 ENCOUNTER — Telehealth: Payer: Federal, State, Local not specified - PPO | Admitting: Cardiology

## 2019-12-21 DIAGNOSIS — R3915 Urgency of urination: Secondary | ICD-10-CM | POA: Diagnosis not present

## 2019-12-21 DIAGNOSIS — R3121 Asymptomatic microscopic hematuria: Secondary | ICD-10-CM | POA: Diagnosis not present

## 2019-12-21 DIAGNOSIS — N3281 Overactive bladder: Secondary | ICD-10-CM | POA: Diagnosis not present

## 2019-12-22 ENCOUNTER — Encounter: Payer: Self-pay | Admitting: Cardiology

## 2019-12-23 ENCOUNTER — Other Ambulatory Visit: Payer: Self-pay

## 2019-12-23 ENCOUNTER — Ambulatory Visit: Payer: Medicare Other

## 2019-12-23 ENCOUNTER — Other Ambulatory Visit: Payer: Self-pay | Admitting: *Deleted

## 2019-12-23 DIAGNOSIS — I69354 Hemiplegia and hemiparesis following cerebral infarction affecting left non-dominant side: Secondary | ICD-10-CM

## 2019-12-23 DIAGNOSIS — R2689 Other abnormalities of gait and mobility: Secondary | ICD-10-CM

## 2019-12-23 DIAGNOSIS — M6281 Muscle weakness (generalized): Secondary | ICD-10-CM | POA: Diagnosis not present

## 2019-12-23 NOTE — Therapy (Signed)
[facility] [address] [place], [place], [address] Phone: [phone]   Fax:  [phone]  Physical Therapy Treatment  Patient Details  Name: Brandon Sanchez MRN: [id number] Date of Birth: August 29, 1945 Referring Provider (PT): Delle Reining   Encounter Date: 12/23/2019  PT End of Session - 12/23/19 1021    Visit Number  2    Number of Visits  17    Date for PT Re-Evaluation  03/19/20   90 day cert, 60 day poc   Authorization Type  medicare so 10th visit progress note, FOTO    PT Start Time  1016    PT Stop Time  1101    PT Time Calculation (min)  45 min    Equipment Utilized During Treatment  Gait belt    Activity Tolerance  Patient tolerated treatment well    Behavior During Therapy  [facility] for tasks assessed/performed       Past Medical History:  Diagnosis Date  . Allergic rhinitis, cause unspecified   . Asthma   . Cataract cortical, senile, bilateral   . Macular degeneration of left eye   . Other and unspecified hyperlipidemia   . Type II or unspecified type diabetes mellitus without mention of complication, not stated as uncontrolled   . Unspecified essential hypertension     Past Surgical History:  Procedure Laterality Date  . CARDIOVERSION N/A 09/22/2019   Procedure: CARDIOVERSION;  Surgeon: [name], MD;  Location: [facility] ENDOSCOPY;  Service: Endoscopy;  Laterality: N/A;  . CARDIOVERSION N/A 10/18/2019   Procedure: CARDIOVERSION;  Surgeon: Jake Bathe, MD;  Location: [facility] ENDOSCOPY;  Service: Cardiovascular;  Laterality: N/A;  . TEE WITHOUT CARDIOVERSION N/A 09/22/2019   Procedure: TRANSESOPHAGEAL ECHOCARDIOGRAM (TEE);  Surgeon: [name], MD;  Location: [facility] ENDOSCOPY;  Service: Endoscopy;  Laterality: N/A;    There were no vitals filed for this visit.  Subjective Assessment - 12/23/19 1018    Subjective  Patient reports he is doing well. Has not been walking with RW in home or to Dr  appointment without any issues. No falls.    Pertinent History  Pt is a 74 yo male presenting with slurred speech and L-sided weakness.  PMH includes: paroxysmal A. 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    Patient Stated Goals  Pt would like to be able to walk further.    Currently in Pain?  No/denies    Pain Onset  More than a month ago         [facility] PT Assessment - 12/23/19 1022      Functional Gait  Assessment   Gait assessed   Yes    Gait Level Surface  Walks 20 ft, slow speed, abnormal gait pattern, evidence for imbalance or deviates 10-15 in outside of the 12 in walkway width. Requires more than 7 sec to ambulate 20 ft.    Change in Gait Speed  Able to change speed, demonstrates mild gait deviations, deviates 6-10 in outside of the 12 in walkway width, or no gait deviations, unable to achieve a major change in velocity, or uses a change in velocity, or uses an assistive device.    Gait with Horizontal Head Turns  Performs head turns smoothly with slight change in gait velocity (eg, minor disruption to smooth gait path), deviates 6-10 in outside 12 in walkway width, or uses an assistive device.  Gait with Vertical Head Turns  Performs task with slight change in gait velocity (eg, minor disruption to smooth gait path), deviates 6 - 10 in outside 12 in walkway width or uses assistive device    Gait and Pivot Turn  Pivot turns safely in greater than 3 sec and stops with no loss of balance, or pivot turns safely within 3 sec and stops with mild imbalance, requires small steps to catch balance.    Step Over Obstacle  Is able to step over one shoe box (4.5 in total height) but must slow down and adjust steps to clear box safely. May require verbal cueing.    Gait with Narrow Base of Support  Ambulates less than 4 steps heel to toe or cannot perform without assistance.     Gait with Eyes Closed  Walks 20 ft, slow speed, abnormal gait pattern, evidence for imbalance, deviates 10-15 in outside 12 in walkway width. Requires more than 9 sec to ambulate 20 ft.    Ambulating Backwards  Walks 20 ft, slow speed, abnormal gait pattern, evidence for imbalance, deviates 10-15 in outside 12 in walkway width.    Steps  Two feet to a stair, must use rail.    Total Score  13    FGA comment:  13/30 = High Fall Risk                   [facility] Adult PT Treatment/Exercise - 12/23/19 1303      Transfers   Transfers  Sit to Stand;Stand to Sit    Sit to Stand  5: Supervision    Stand to Sit  5: Supervision      Ambulation/Gait   Ambulation/Gait  Yes    Ambulation/Gait Assistance  5: Supervision    Ambulation Distance (Feet)  250 Feet    Assistive device  None    Gait Pattern  Step-through pattern;Decreased step length - right;Decreased step length - left;Decreased stride length;Decreased trunk rotation    Ambulation Surface  Level;Indoor      Exercises   Exercises  Knee/Hip      Knee/Hip Exercises: Stretches   Passive Hamstring Stretch  Both;2 reps;30 seconds    Passive Hamstring Stretch Limitations  verbal cues on form      Knee/Hip Exercises: Standing   Heel Raises  Both;2 sets;10 reps    Heel Raises Limitations  with UE from // bars    Hip ADduction  1 set    Hip Abduction  Both;1 set;10 reps;Stengthening;Knee straight    Abduction Limitations  completed with light UE support from // bars, verbal cues for knee extension    Hip Extension  Stengthening;Both;2 sets;10 reps;Knee straight    Extension Limitations  verbal cues for reduced range to allow for proper knee extension with completion. completed with light UE support from // bars.     Other Standing Knee Exercises  toe raises with UE support from // bars, 2 x 10 reps. vebral cues to avoid posterior lean with completion             PT Education - 12/23/19 1102    Education Details  Educated on  Initial HEP (see patient instructions)    Person(s) Educated  Patient    Methods  Explanation;Demonstration;Handout    Comprehension  Verbalized understanding       PT Short Term Goals - 12/20/19 1908      PT SHORT TERM GOAL #1   Title  Pt will be independent with  initial HEP for strengthening and balance.    Time  4    Period  Weeks    Status  New    Target Date  01/19/20      PT SHORT TERM GOAL #2   Title  FGA will be assessed and LTG written to further assess balance.    Time  4    Period  Weeks    Status  New    Target Date  01/19/20      PT SHORT TERM GOAL #3   Title  Pt will decrease TUG from 26 sec to <20 sec for improved balance and functional mobility.    Baseline  26 sec on 12/20/19    Time  4    Period  Weeks    Status  New    Target Date  01/19/20      PT SHORT TERM GOAL #4   Title  Pt will ambulate > 300' on level surfaces without AD independently for improved household mobility.    Time  4    Period  Weeks    Status  New    Target Date  01/19/20        PT Long Term Goals - 12/20/19 1911      PT LONG TERM GOAL #1   Title  Pt will be independent with progressive HEP for strength, and balance to continue gains on own.    Time  8    Period  Weeks    Status  New    Target Date  02/18/20      PT LONG TERM GOAL #2   Title  Pt will increase gait speed form 0.56m/s to >0.34m/s for improved gait safety.    Baseline  0.52m/s on 12/20/19    Time  8    Period  Weeks    Status  New    Target Date  02/18/20      PT LONG TERM GOAL #3   Title  Pt will ambulate >500' on varied surfaces independently for improved household mobility.    Time  8    Period  Weeks    Status  New    Target Date  02/18/20      PT LONG TERM GOAL #4   Title  FGA TBD    Time  8    Period  Weeks    Status  New    Target Date  02/18/20            Plan - 12/23/19 1307    Clinical Impression Statement  Today's skilled PT session focused on further gait assessment with  completion of FGA, patient scored a 13/30 demonstrating high fall risk. Patient had difficulty with tandem walking, backwards walking, and negotiating over obstacles. Initiated HEP today with focus on BLE strengthening and stretching. Patient will continue to benefit from skilled PT services to address strength, balance, and improve overlal functional mobility.    Personal Factors and Comorbidities  Comorbidity 3+    Comorbidities  paroxysmal A. 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    Examination-Activity Limitations  Stairs;Locomotion Level;Transfers    Examination-Participation Restrictions  Community Activity;Yard Work    Stability/Clinical Decision Making  Evolving/Moderate complexity    Rehab Potential  Good    PT Frequency  2x / week   plus eval   PT Duration  8 weeks    PT  Treatment/Interventions  ADLs/Self Care Home Management;Cryotherapy;Moist Heat;DME Instruction;Gait training;Stair training;Functional mobility training;Therapeutic activities;Neuromuscular re-education;Balance training;Therapeutic exercise;Patient/family education;Passive range of motion;Manual techniques;Vestibular    PT Next Visit Plan  How is HEP going? Continue gait training and balance training.    Consulted and Agree with Plan of Care  Patient       Patient will benefit from skilled therapeutic intervention in order to improve the following deficits and impairments:  Abnormal gait, Decreased activity tolerance, Decreased knowledge of use of DME, Decreased strength, Decreased mobility, Impaired sensation, Decreased balance  Visit Diagnosis: Other abnormalities of gait and mobility  Muscle weakness (generalized)  Hemiplegia and hemiparesis following cerebral infarction affecting left non-dominant side [facility])     Problem List Patient Active Problem List   Diagnosis Date Noted  .  Anemia 12/19/2019  . Basal ganglia stroke (HCC) 12/10/2019  . Left-sided weakness   . Slow transit constipation   . Pain in toes of both feet   . Cerebral embolism with cerebral infarction 12/04/2019  . CVA (cerebral vascular accident) (HCC) 12/04/2019  . Acute left-sided weakness 12/03/2019  . Mixed diabetic hyperlipidemia associated with type 2 diabetes mellitus (HCC) 12/03/2019  . Paroxysmal atrial fibrillation (HCC)   . Secondary hypercoagulable state (HCC) 09/28/2019  . Atrial fibrillation with rapid ventricular response (HCC) 09/20/2019  . CAP (community acquired pneumonia) 09/20/2019  . Obesity 05/12/2015  . Uncontrolled type 2 diabetes mellitus with hypoglycemia without coma, with long-term current use of insulin (HCC) 02/04/2008  . HLD (hyperlipidemia) 02/04/2008  . Essential hypertension 02/04/2008  . ALLERGIC RHINITIS 02/04/2008  . 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 02/04/2008    Tempie Donning, PT, DPT 12/23/2019, 1:11 PM  [facility] [address] [place], [place], [address] Phone: [phone]   Fax:  [phone]  Name: Brandon Sanchez MRN: [id number] Date of Birth: [birth year]

## 2019-12-23 NOTE — Patient Instructions (Signed)
Access Code: [email or web address] URL: [email or web address] Date: 12/23/2019 Prepared by: Jethro Bastos  Exercises Toe Raises with Counter Support - 1 x daily - 7 x weekly - 2 sets - 10 reps Heel rises with counter support - 1 x daily - 7 x weekly - 2 sets - 10 reps Standing Hip Abduction with Counter Support - 1 x daily - 7 x weekly - 2 sets - 10 reps Standing Hip Extension with Counter Support - 1 x daily - 7 x weekly - 2 sets - 10 reps Seated Hamstring Stretch - 1 x daily - 7 x weekly - 1 sets - 3 reps - 30 hold

## 2019-12-23 NOTE — Patient Outreach (Signed)
[facility]) Care Management  12/23/2019  Brandon Sanchez 06/11/1946 [id number]   Call placed to member to follow up on hospital discharge, no answer.  HIPAA compliant voice message left, will follow up within the next 3-4 business days.  Kemper Durie, [place], MSN [facility] Care Management  [facility] Manager [phone]

## 2019-12-27 ENCOUNTER — Other Ambulatory Visit: Payer: Self-pay

## 2019-12-27 ENCOUNTER — Encounter: Payer: Self-pay | Admitting: Cardiology

## 2019-12-27 ENCOUNTER — Ambulatory Visit (INDEPENDENT_AMBULATORY_CARE_PROVIDER_SITE_OTHER): Payer: Medicare Other | Admitting: Cardiology

## 2019-12-27 VITALS — BP 132/74 | HR 79 | Temp 97.2°F | Ht 75.0 in | Wt 290.0 lb

## 2019-12-27 DIAGNOSIS — Z8673 Personal history of transient ischemic attack (TIA), and cerebral infarction without residual deficits: Secondary | ICD-10-CM | POA: Diagnosis not present

## 2019-12-27 DIAGNOSIS — I480 Paroxysmal atrial fibrillation: Secondary | ICD-10-CM

## 2019-12-27 DIAGNOSIS — Z7189 Other specified counseling: Secondary | ICD-10-CM | POA: Diagnosis not present

## 2019-12-27 DIAGNOSIS — R600 Localized edema: Secondary | ICD-10-CM | POA: Diagnosis not present

## 2019-12-27 DIAGNOSIS — I6523 Occlusion and stenosis of bilateral carotid arteries: Secondary | ICD-10-CM

## 2019-12-27 NOTE — Progress Notes (Signed)
Cardiology Office Note:    Date:  12/27/2019   ID:  Brandon Sanchez, DOB [birth year], MRN [id number]  PCP:  Merri Brunette, MD  Cardiologist:  Jodelle Red, MD  Referring MD: Merri Brunette, MD   CC: follow up  History of Present Illness:    Brandon Sanchez is a 74 y.o. male with a hx of asthma, type II diabetes, hypertension, hyperlipidemia, atrial fibrillation who is seen for follow up.   Cardiac history: admitted 09/19/19 with atrial fibrillation with RVR. Has known history of type II diabetes, hypertension, and hyperlipidemia. He was also treated for community acquired pneumonia during that admission. He was started on anticoagulation, and rate control was attempted. Course was complicated by hypotension. He underwent TEE-CV with initial return to sinus rhythm. However after discharge, he returned to afib with RVR.  He was admitted 11/2019 with CVA and underwent inpatient rehab.  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 Hospital notes reviewed. His presenting symptoms were acute left sided weakness and facial droop. He was outside the tPA window. He was continued on apixaban and atorvastatin (increased from 40 mg to 80 mg), with aspirin added. He completed an additional week of inpatient rehab prior to returning home.  Feeling improved since discharge. Did well with inpatient rehab, now doing outpatient PT. Saw a urologist for overactive bladder, medication seems to be helping. Did have blood in his urine, has upcoming CT and cystoscopy. Has not been told if he will be holding his anticoagulation prior. We discussed that this is common.  Swelling improved after atrial fibrillation was treated, but now has mild swelling. Stopped furosemide and metoprolol in the hospital. Appears that this was stopped due to well controlled blood pressure; no change in kidney function. His legs are swollen today. Weighs himself daily; lost weight  after the afib (his estimate is 50 lbs). Since the stroke, has gained about 20 lbs back. Diet has not changed. Lowest weight 270 lbs, now up near 290 lbs. Was taking furosemide 40 mg twice a day, and dropped to 40 mg daily just before the hospitalization.  Has had occasional high reading (highest briefly 170, but not sustained. Most 130s-140s). Heart rate has been stable 70s-80s. Reviewed his home logs.  Denies chest pain, shortness of breath at rest. No PND, orthopnea. No syncope or palpitations.  Past Medical History:  Diagnosis Date  . Allergic rhinitis, cause unspecified   . Asthma   . Cataract cortical, senile, bilateral   . Macular degeneration of left eye   . Other and unspecified hyperlipidemia   . Type II or unspecified type diabetes mellitus without mention of complication, not stated as uncontrolled   . Unspecified essential hypertension     Past Surgical History:  Procedure Laterality Date  . CARDIOVERSION N/A 09/22/2019   Procedure: CARDIOVERSION;  Surgeon: [name], MD;  Location: [facility] ENDOSCOPY;  Service: Endoscopy;  Laterality: N/A;  . CARDIOVERSION N/A 10/18/2019   Procedure: CARDIOVERSION;  Surgeon: Jake Bathe, MD;  Location: [facility] ENDOSCOPY;  Service: Cardiovascular;  Laterality: N/A;  . TEE WITHOUT CARDIOVERSION N/A 09/22/2019   Procedure: TRANSESOPHAGEAL ECHOCARDIOGRAM (TEE);  Surgeon: [name], MD;  Location: [facility] ENDOSCOPY;  Service: Endoscopy;  Laterality: N/A;    Current Medications: Current Outpatient Medications on File Prior to Visit  Medication Sig  . albuterol (PROAIR HFA) 108 (90 BASE) MCG/ACT inhaler 2 puffs every 4 hours as needed only  if your can't catch your breath (Patient taking differently:  Inhale 2 puffs into the lungs every 4 (four) hours as needed for wheezing. )  . amiodarone (PACERONE) 200 MG tablet Take 1 tablet (200 mg total) by mouth daily.  Marland Kitchen apixaban (ELIQUIS) 5 MG TABS tablet Take 1 tablet (5 mg total) by mouth 2  (two) times daily.  Marland Kitchen aspirin EC 81 MG EC tablet Take 1 tablet (81 mg total) by mouth daily.  Marland Kitchen atorvastatin (LIPITOR) 80 MG tablet Take 1 tablet (80 mg total) by mouth every evening.  Marland Kitchen BREO ELLIPTA 200-25 MCG/INH AEPB Inhale 1 puff into the lungs daily as needed (shortness of breath).   . insulin aspart protamine- aspart (NOVOLOG MIX 70/30) (70-30) 100 UNIT/ML injection Inject 0.16 mLs (16 Units total) into the skin 2 (two) times daily at 8 am and 10 pm. Dose varies - Prescription is for 20 units with each meal (twice daily). However administration depends on what types of meals being taken.  . iron polysaccharides (NIFEREX) 150 MG capsule Take 1 capsule (150 mg total) by mouth daily.  . Multiple Vitamin (MULTIVITAMIN WITH MINERALS) TABS tablet Take 1 tablet by mouth daily.  Marland Kitchen omeprazole (PRILOSEC) 40 MG capsule Take 40 mg by mouth daily as needed (heart burn).   Letta Pate VERIO test strip 1 each by Other route in the morning, at noon, and at bedtime.   . polyethylene glycol (MIRALAX / GLYCOLAX) 17 g packet Take 17 g by mouth daily.  Marland Kitchen PREVIDENT 5000 BOOSTER PLUS 1.1 % PSTE Place 1 application onto teeth in the morning and at bedtime.   . senna-docusate (SENOKOT-S) 8.6-50 MG tablet Take 2 tablets by mouth 2 (two) times daily.  . sertraline (ZOLOFT) 100 MG tablet Take 100 mg by mouth daily.   No current facility-administered medications on file prior to visit.     Allergies:   Patient has no known allergies.   Social History   Tobacco Use  . Smoking status: Never Smoker  . Smokeless tobacco: Never Used  Substance Use Topics  . Alcohol use: Not Currently    Comment: Used to drink 2 L bottle of wine/day. Quit 09/18/19  . Drug use: Never    Family History: family history includes Colon cancer in his father; Heart disease in his paternal grandfather.  ROS:   Please see the history of present illness.  Additional pertinent ROS negative except as noted in HPI   EKGs/Labs/Other  Studies Reviewed:    The following studies were reviewed today: Echo 12/04/19 1. Left ventricular ejection fraction, by estimation, is 55 to 60%. The  left ventricle has normal function. The left ventricle has no regional  wall motion abnormalities. The left ventricular internal cavity size was  mildly dilated. There is mild  concentric left ventricular hypertrophy. Left ventricular diastolic  parameters are consistent with Grade III diastolic dysfunction  (restrictive). Elevated left ventricular end-diastolic pressure.  2. Right ventricular systolic function is normal. The right ventricular  size is mildly enlarged.  3. Left atrial size was mildly dilated.  4. Right atrial size was mild to moderately dilated.  5. The mitral valve is grossly normal. Trivial mitral valve  regurgitation. No evidence of mitral stenosis.  6. The aortic valve is tricuspid. Aortic valve regurgitation is mild to  moderate. Moderate aortic valve stenosis.  7. The inferior vena cava is dilated in size with >50% respiratory  variability, suggesting right atrial pressure of 8 mmHg.   Echo 09/20/19 1. Left ventricular ejection fraction, by visual estimation, is 50 to  55%. The left ventricle has normal function. There is mildly increased  left ventricular hypertrophy.  2. Definity contrast agent was given IV to delineate the left ventricular  endocardial borders.  3. Left ventricular diastolic function could not be evaluated.  4. Mildly dilated left ventricular internal cavity size.  5. The left ventricle has no regional wall motion abnormalities.  6. Global right ventricle has normal systolic function.The right  ventricular size is mildly enlarged.  7. Left atrial size was mildly dilated.  8. Right atrial size was mildly dilated.  9. Mild mitral annular calcification.  10. Trivial mitral valve regurgitation. No evidence of mitral stenosis.  11. The tricuspid valve is normal in structure.  Tricuspid valve  regurgitation is trivial.  12. The aortic valve was not well visualized. Aortic valve regurgitation  is not visualized. Mild to moderate aortic valve stenosis.  13. The pulmonic valve was not well visualized. Pulmonic valve  regurgitation is not visualized.  14. The inferior vena cava is dilated in size with <50% respiratory  variability, suggesting right atrial pressure of 15 mmHg.  15. Technically difficult; definity used; low normal LV systolic function;  mild LVH; mild LVE; calcified aortic valve with mild to moderate AS (mean  gradient 19 mmHg); mild LAE/RAE/RVE.   EKG:  EKG is personally reviewed.  The ekg ordered today demonstrates NSR, PRWP, long QT  Recent Labs: 09/20/2019: B Natriuretic Peptide 289.1 09/28/2019: TSH 2.357 12/06/2019: Magnesium 2.1 12/11/2019: ALT 16 12/13/2019: BUN 10; Creatinine, Ser 1.03; Hemoglobin 9.6; Platelets 289; Potassium 3.6; Sodium 142  Recent Lipid Panel    Component Value Date/Time   CHOL 96 12/04/2019 0503   TRIG 59 12/04/2019 0503   HDL 32 (L) 12/04/2019 0503   CHOLHDL 3.0 12/04/2019 0503   VLDL 12 12/04/2019 0503   LDLCALC 52 12/04/2019 0503    Physical Exam:    VS:  BP 132/74   Pulse 79   Temp (!) 97.2 F (36.2 C)   Ht 6\' 3"  (1.905 m)   Wt 290 lb (131.5 kg)   SpO2 95%   BMI 36.25 kg/m     Wt Readings from Last 3 Encounters:  12/16/19 283 lb 11.7 oz (128.7 kg)  12/04/19 272 lb 7.8 oz (123.6 kg)  11/24/19 270 lb (122.5 kg)    GEN: Well nourished, well developed in no acute distress HEENT: Normal, moist mucous membranes NECK: No JVD CARDIAC: regular rhythm, normal S1 and S2, no rubs or gallops. 2/6 systolic murmur. VASCULAR: Radial and DP pulses 2+ bilaterally. No carotid bruits RESPIRATORY:  Clear to auscultation without rales, wheezing or rhonchi  ABDOMEN: Soft, non-tender, non-distended MUSCULOSKELETAL:  Ambulates independently SKIN: Warm and dry. Bilateral LE edema, L>R. 2+ edema on left, 1+ edema on  right NEUROLOGIC:  Alert and oriented x 3. No focal neuro deficits noted. PSYCHIATRIC:  Normal affect   ASSESSMENT:    1. Bilateral leg edema   2. Paroxysmal atrial fibrillation (HCC)   3. History of CVA (cerebrovascular accident)   4. Cardiac risk counseling   5. Counseling on health promotion and disease prevention    PLAN:    Paroxysmal atrial fibrillation with RVR:  -has remained in NSR post cardioversion on amiodarone. In NSR today. -on apixaban for anticoagulation -was planned for ablation with Dr. 01/24/20, but this is on hold with recent stroke. Recommended speaking with neurology re: timing for anesthesia to reschedule ablation -instructed on red flag warning signs that need immediate medical attention  Recent hospitalization for CVA: -on  aspirin, apixaban, and atorvastatin  LE edema:  -worsened, as well as weight gain.  -will increase to 40 mg BID lasix for one week, then return to 40 mg lasix daily  Possible OSA: sleep study didn't meet criteria for CPAP. Recommended to avoid laying supine.  CV risk counseling and prevention: -recommend heart healthy/Mediterranean diet, with whole grains, fruits, vegetable, fish, lean meats, nuts, and olive oil. Limit salt. -recommend moderate walking, 3-5 times/week for 30-50 minutes each session. Aim for at least 150 minutes.week. Goal should be pace of 3 miles/hours, or walking 1.5 miles in 30 minutes -recommend avoidance of tobacco products. Avoid excess alcohol.  Plan for follow up: 3 mos  Total time of encounter: 60 minutes total time of encounter, including 50 minutes spent in face-to-face patient care. This time includes coordination of care and counseling regarding recent hospitalization and plan as above. Remainder of non-face-to-face time involved reviewing chart documents/testing relevant to the patient encounter and documentation in the medical record. In at 11:58 AM, out at 12:48 PM  Buford Dresser, MD, PhD [facility]   Medication Adjustments/Labs and Tests Ordered: Current medicines are reviewed at length with the patient today.  Concerns regarding medicines are outlined above.  No orders of the defined types were placed in this encounter.  Meds ordered this encounter  Medications  . furosemide (LASIX) 40 MG tablet    Sig: Take 1 tablet (40 mg total) by mouth daily.    Dispense:  90 tablet    Refill:  3    Patient Instructions  Medication Instructions:  Restart furosemide (lasix) 40 mg twice a day for a week, then change to 40 mg furosemide daily after.  *If you need a refill on your cardiac medications before your next appointment, please call your pharmacy*   Follow-Up: At [facility], you and your health needs are our priority.  As part of our continuing mission to provide you with exceptional heart care, we have created designated Provider Care Teams.  These Care Teams include your primary Cardiologist (physician) and Advanced Practice Providers (APPs -  Physician Assistants and Nurse Practitioners) who all work together to provide you with the care you need, when you need it.  We recommend signing up for the patient portal called "MyChart".  Sign up information is provided on this After Visit Summary.  MyChart is used to connect with patients for Virtual Visits (Telemedicine).  Patients are able to view lab/test results, encounter notes, upcoming appointments, etc.  Non-urgent messages can be sent to your provider as well.   To learn more about what you can do with MyChart, go to [email or web address].    Your next appointment:   3 month(s)  The format for your next appointment:   In Person  Provider:   Buford Dresser, MD   Other Instructions Continue to take blood pressures and daily weights.   Follow up with Dr. Macky Lower office on reschedule of ablation. Recommend discussing with neurology when he is ok for anesthesia. May also want to wait until  after PT completed.   Signed, Buford Dresser, MD PhD 12/27/2019   [place]

## 2019-12-27 NOTE — Patient Instructions (Addendum)
Medication Instructions:  Restart furosemide (lasix) 40 mg twice a day for a week, then change to 40 mg furosemide daily after.  *If you need a refill on your cardiac medications before your next appointment, please call your pharmacy*   Follow-Up: At [facility], you and your health needs are our priority.  As part of our continuing mission to provide you with exceptional heart care, we have created designated Provider Care Teams.  These Care Teams include your primary Cardiologist (physician) and Advanced Practice Providers (APPs -  Physician Assistants and Nurse Practitioners) who all work together to provide you with the care you need, when you need it.  We recommend signing up for the patient portal called "MyChart".  Sign up information is provided on this After Visit Summary.  MyChart is used to connect with patients for Virtual Visits (Telemedicine).  Patients are able to view lab/test results, encounter notes, upcoming appointments, etc.  Non-urgent messages can be sent to your provider as well.   To learn more about what you can do with MyChart, go to [email or web address].    Your next appointment:   3 month(s)  The format for your next appointment:   In Person  Provider:   Jodelle Red, MD   Other Instructions Continue to take blood pressures and daily weights.   Follow up with Dr. Gershon Crane office on reschedule of ablation. Recommend discussing with neurology when he is ok for anesthesia. May also want to wait until after PT completed.

## 2019-12-28 ENCOUNTER — Ambulatory Visit: Payer: Medicare Other

## 2019-12-28 DIAGNOSIS — M6281 Muscle weakness (generalized): Secondary | ICD-10-CM | POA: Diagnosis not present

## 2019-12-28 DIAGNOSIS — R2689 Other abnormalities of gait and mobility: Secondary | ICD-10-CM

## 2019-12-28 DIAGNOSIS — I69354 Hemiplegia and hemiparesis following cerebral infarction affecting left non-dominant side: Secondary | ICD-10-CM | POA: Diagnosis not present

## 2019-12-28 NOTE — Therapy (Signed)
[facility] [address] [place], [place], [address] Phone: [phone]   Fax:  [phone]  Physical Therapy Treatment  Patient Details  Name: Brandon Sanchez MRN: [id number] Date of Birth: 14-May-1946 Referring Provider (PT): Reesa Chew   Encounter Date: 12/28/2019  PT End of Session - 12/28/19 1408    Visit Number  3    Number of Visits  17    Date for PT Re-Evaluation  85/27/78   90 day cert, 60 day poc   Authorization Type  medicare so 10th visit progress note, FOTO    PT Start Time  1406    PT Stop Time  1444    PT Time Calculation (min)  38 min    Equipment Utilized During Treatment  Gait belt    Activity Tolerance  Patient tolerated treatment well    Behavior During Therapy  WFL for tasks assessed/performed       Past Medical History:  Diagnosis Date  . Allergic rhinitis, cause unspecified   . Asthma   . Cataract cortical, senile, bilateral   . Macular degeneration of left eye   . Other and unspecified hyperlipidemia   . Type II or unspecified type diabetes mellitus without mention of complication, not stated as uncontrolled   . Unspecified essential hypertension     Past Surgical History:  Procedure Laterality Date  . CARDIOVERSION N/A 09/22/2019   Procedure: CARDIOVERSION;  Surgeon: Donato Heinz, MD;  Location: [facility] ENDOSCOPY;  Service: Endoscopy;  Laterality: N/A;  . CARDIOVERSION N/A 10/18/2019   Procedure: CARDIOVERSION;  Surgeon: Jerline Pain, MD;  Location: [facility] ENDOSCOPY;  Service: Cardiovascular;  Laterality: N/A;  . TEE WITHOUT CARDIOVERSION N/A 09/22/2019   Procedure: TRANSESOPHAGEAL ECHOCARDIOGRAM (TEE);  Surgeon: Donato Heinz, MD;  Location: [facility] ENDOSCOPY;  Service: Endoscopy;  Laterality: N/A;    There were no vitals filed for this visit.  Subjective Assessment - 12/28/19 1409    Subjective  Pt reports that he saw the cardiologist yesterday and they started him on  furosemide which has really helped with swelling in legs.    Pertinent History  Pt is a 74 yo male presenting with slurred speech and L-sided weakness.  PMH includes: paroxysmal A. 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    Patient Stated Goals  Pt would like to be able to walk further.    Currently in Pain?  No/denies    Pain Onset  More than a month ago                       [facility] Adult PT Treatment/Exercise - 12/28/19 1412      Transfers   Transfers  Sit to Stand;Stand to Sit    Sit to Stand  5: Supervision    Stand to Sit  5: Supervision      Ambulation/Gait   Ambulation/Gait  Yes    Ambulation/Gait Assistance  5: Supervision    Ambulation/Gait Assistance Details  Verbal cues for larger step length and to relax arms. PT helped facilitate rotation at pelvis    Ambulation Distance (Feet)  230 Feet    Assistive device  None    Gait Pattern  Step-through pattern;Decreased step length - right;Decreased step length - left;Decreased trunk rotation;Decreased arm swing - right;Decreased arm swing - left    Ambulation Surface  Level;Indoor  Neuro Re-ed    Neuro Re-ed Details   Reciprocal steps over 3 foam beams with 1 UE support x 2 laps then x 2 laps without UE support, side stepping over 3 foam beams x 2 laps. Standing on foam beam 30 sec x 2, tandem stance 30 sec each position, standing on pillow feet together x 30 sec eyes open and x 30 sec eyes closed. Close SBA/CGA for safety. Pt had increased sway on foam beam having to touch a couple times initially      Exercises   Exercises  Other Exercises    Other Exercises   Reviewed HEP: seated hamstring stretch x 30 sec each leg. Standing at // bars: raising up on toes and back on heels x 10, hip abd x 10 with verbal cues for form, hip extension x 10 with verbal cues for form to keep leg straight. Side  stepping along bars without UE support x 3 laps.              PT Education - 12/28/19 1824    Education Details  Pt to continue with current HEP    Person(s) Educated  Patient    Methods  Explanation    Comprehension  Verbalized understanding       PT Short Term Goals - 12/28/19 1828      PT SHORT TERM GOAL #1   Title  Pt will be independent with initial HEP for strengthening and balance.    Time  4    Period  Weeks    Status  New    Target Date  01/19/20      PT SHORT TERM GOAL #2   Title  FGA will be assessed and LTG written to further assess balance.    Baseline  FGA was 13/30    Time  4    Period  Weeks    Status  Achieved    Target Date  01/19/20      PT SHORT TERM GOAL #3   Title  Pt will decrease TUG from 26 sec to <20 sec for improved balance and functional mobility.    Baseline  26 sec on 12/20/19    Time  4    Period  Weeks    Status  New    Target Date  01/19/20      PT SHORT TERM GOAL #4   Title  Pt will ambulate > 300' on level surfaces without AD independently for improved household mobility.    Time  4    Period  Weeks    Status  New    Target Date  01/19/20        PT Long Term Goals - 12/28/19 1828      PT LONG TERM GOAL #1   Title  Pt will be independent with progressive HEP for strength, and balance to continue gains on own.    Time  8    Period  Weeks    Status  New      PT LONG TERM GOAL #2   Title  Pt will increase gait speed form 0.58m/s to >0.108m/s for improved gait safety.    Baseline  0.88m/s on 12/20/19    Time  8    Period  Weeks    Status  New      PT LONG TERM GOAL #3   Title  Pt will ambulate >500' on varied surfaces independently for improved household mobility.    Time  8  Period  Weeks    Status  New      PT LONG TERM GOAL #4   Title  Pt will increase FGA from 13 to >19/30 for improved balance and decreased fall risk.    Baseline  13/30    Time  8    Period  Weeks    Status  New            Plan -  12/28/19 1825    Clinical Impression Statement  PT worked on increasing step length today. He was able to demonstrate improvement after practice.    Personal Factors and Comorbidities  Comorbidity 3+    Comorbidities  paroxysmal A. 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    Examination-Activity Limitations  Stairs;Locomotion Level;Transfers    Examination-Participation Restrictions  Community Activity;Yard Work    Conservation officer, historic buildings  Evolving/Moderate complexity    Rehab Potential  Good    PT Frequency  2x / week   plus eval   PT Duration  8 weeks    PT Treatment/Interventions  ADLs/Self Care Home Management;Cryotherapy;Moist Heat;DME Instruction;Gait training;Stair training;Functional mobility training;Therapeutic activities;Neuromuscular re-education;Balance training;Therapeutic exercise;Patient/family education;Passive range of motion;Manual techniques;Vestibular    PT Next Visit Plan  Add balance exercises to HEP. Continue gait training working on increasing step length and arm swing and balance training.    Consulted and Agree with Plan of Care  Patient       Patient will benefit from skilled therapeutic intervention in order to improve the following deficits and impairments:  Abnormal gait, Decreased activity tolerance, Decreased knowledge of use of DME, Decreased strength, Decreased mobility, Impaired sensation, Decreased balance  Visit Diagnosis: Other abnormalities of gait and mobility  Muscle weakness (generalized)     Problem List Patient Active Problem List   Diagnosis Date Noted  . History of CVA (cerebrovascular accident) 12/27/2019  . Bilateral leg edema 12/27/2019  . Anemia 12/19/2019  . Basal ganglia stroke (HCC) 12/10/2019  . Left-sided weakness   . Slow transit constipation   . Pain in toes of both feet   . Cerebral  embolism with cerebral infarction 12/04/2019  . CVA (cerebral vascular accident) (HCC) 12/04/2019  . Acute left-sided weakness 12/03/2019  . Mixed diabetic hyperlipidemia associated with type 2 diabetes mellitus (HCC) 12/03/2019  . Paroxysmal atrial fibrillation (HCC)   . Secondary hypercoagulable state (HCC) 09/28/2019  . Atrial fibrillation with rapid ventricular response (HCC) 09/20/2019  . CAP (community acquired pneumonia) 09/20/2019  . Obesity 05/12/2015  . Uncontrolled type 2 diabetes mellitus with hypoglycemia without coma, with long-term current use of insulin (HCC) 02/04/2008  . HLD (hyperlipidemia) 02/04/2008  . Essential hypertension 02/04/2008  . ALLERGIC RHINITIS 02/04/2008  . 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 02/04/2008    Ronn Melena, PT, DPT, NCS 12/28/2019, 6:29 PM  [facility] [address] [place], [place], [address] Phone: [phone]   Fax:  [phone]  Name: Brandon Sanchez MRN: [id number] Date of Birth: 04-23-1946

## 2019-12-29 ENCOUNTER — Other Ambulatory Visit: Payer: Self-pay | Admitting: *Deleted

## 2019-12-29 DIAGNOSIS — N200 Calculus of kidney: Secondary | ICD-10-CM | POA: Diagnosis not present

## 2019-12-29 DIAGNOSIS — R3121 Asymptomatic microscopic hematuria: Secondary | ICD-10-CM | POA: Diagnosis not present

## 2019-12-29 NOTE — Patient Outreach (Signed)
[facility]) Care Management  12/29/2019  Brandon Sanchez Oct 11, 1945 [id number]   Outreach attempt #2, successful.  Call placed to member to follow up on hospital discharge.  Report he has made much improvement since having his stroke.  He has 2 walkers in his home but state he is barely using them due to increased strength.  He is active with outpatient PT, attending 2-3 days a week.  Has follow up appointment with PCP on 5/25 and with neurology on 6/3.  He daughter has worked with him on securing a transportation services that takes him to all of his appointments.  Denies concern regarding stroke but report having follow up with urology today for CT and cystoscopy due to having some blood in his urine.  He will follow up again with urology next week.  Denies any other urgent concerns, advised to contact this care manager with questions.  Will follow up within the next month.  THN CM Care Plan Problem One     Most Recent Value  Care Plan Problem One  Risk for hospitalization related to A-fib as evidenced by recent admission  Role Documenting the Problem One  Care Management [place] for Problem One  Active  [facility] Long Term Goal   Member will not be readmitted to hospital with stroke related complications within the next 31 days  THN Long Term Goal Start Date  12/29/19 [facility] admission]  Interventions for Problem One Long Term Goal  Discharge AVS reviewed with member.  Confirmed he is active with outpatient rehab for physical therapy, increasing strength.  Discussed using home exercises to continue strenght building  THN CM Short Term Goal #1   Member will report checking blood pressure and heart rate daily over the next 4 weeks  THN CM Short Term Goal #1 Start Date  12/29/19 [Date reset]  Interventions for Short Term Goal #1  Discussed risk of recurrent stroke, educated on importance of daily monitoring  THN CM Short Term Goal #2   Member will have appointment  scheduled with PCP within the next 3 weeks  THN CM Short Term Goal #2 Start Date  11/09/19  [facility] CM Short Term Goal #2 Met Date  12/29/19  THN CM Short Term Goal #3  Member will report attending follow up within neurology within the next 4 weeks  THN CM Short Term Goal #3 Start Date  12/29/19  Interventions for Short [place]ern Goal #3  Reviewed upcoming appointment with member.  Confirmed mode of transportation, advised of complications if no follow up     Valente David, Therapist, sports, MSN [place] [phone]

## 2019-12-30 ENCOUNTER — Ambulatory Visit: Payer: Medicare Other

## 2019-12-30 ENCOUNTER — Other Ambulatory Visit: Payer: Self-pay

## 2019-12-30 DIAGNOSIS — M6281 Muscle weakness (generalized): Secondary | ICD-10-CM

## 2019-12-30 DIAGNOSIS — I69354 Hemiplegia and hemiparesis following cerebral infarction affecting left non-dominant side: Secondary | ICD-10-CM | POA: Diagnosis not present

## 2019-12-30 DIAGNOSIS — R2689 Other abnormalities of gait and mobility: Secondary | ICD-10-CM | POA: Diagnosis not present

## 2019-12-30 NOTE — Therapy (Signed)
[place] [address] [place], [place], [address] Phone: [phone]   Fax:  [phone]  Physical Therapy Treatment  Patient Details  Name: Brandon Sanchez MRN: [id number] Date of Birth: 25-Sep-1945 Referring Provider (PT): Reesa Chew   Encounter Date: 12/30/2019  PT End of Session - 12/30/19 1017    Visit Number  4    Number of Visits  17    Date for PT Re-Evaluation  35/57/32   90 day cert, 60 day poc   Authorization Type  medicare so 10th visit progress note, FOTO    PT Start Time  1015    PT Stop Time  1059    PT Time Calculation (min)  44 min    Equipment Utilized During Treatment  Gait belt    Activity Tolerance  Patient tolerated treatment well    Behavior During Therapy  [facility] for tasks assessed/performed       Past Medical History:  Diagnosis Date  . Allergic rhinitis, cause unspecified   . Asthma   . Cataract cortical, senile, bilateral   . Macular degeneration of left eye   . Other and unspecified hyperlipidemia   . Type II or unspecified type diabetes mellitus without mention of complication, not stated as uncontrolled   . Unspecified essential hypertension     Past Surgical History:  Procedure Laterality Date  . CARDIOVERSION N/A 09/22/2019   Procedure: CARDIOVERSION;  Surgeon: Donato Heinz, MD;  Location: [facility] ENDOSCOPY;  Service: Endoscopy;  Laterality: N/A;  . CARDIOVERSION N/A 10/18/2019   Procedure: CARDIOVERSION;  Surgeon: Jerline Pain, MD;  Location: [facility] ENDOSCOPY;  Service: Cardiovascular;  Laterality: N/A;  . TEE WITHOUT CARDIOVERSION N/A 09/22/2019   Procedure: TRANSESOPHAGEAL ECHOCARDIOGRAM (TEE);  Surgeon: Donato Heinz, MD;  Location: [facility] ENDOSCOPY;  Service: Endoscopy;  Laterality: N/A;    There were no vitals filed for this visit.  Subjective Assessment - 12/30/19 1018    Subjective  Reports feeling well, had CT scan yesterday at urologist. Reports HEP is going  well, no issues. No falls.    Pertinent History  Pt is a 74 yo male presenting with slurred speech and L-sided weakness.  PMH includes: paroxysmal A. 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    Patient Stated Goals  Pt would like to be able to walk further.    Currently in Pain?  No/denies    Pain Onset  More than a month ago                        [facility] Adult PT Treatment/Exercise - 12/30/19 1020      Ambulation/Gait   Ambulation/Gait  Yes    Ambulation/Gait Assistance  5: Supervision    Ambulation/Gait Assistance Details  Reports mild SOB at end of walk during recovery. Verbal cues for improved step length during ambulation.     Ambulation Distance (Feet)  350 Feet    Assistive device  None    Gait Pattern  Step-through pattern;Decreased step length - right;Decreased step length - left;Decreased trunk rotation;Decreased arm swing - right;Decreased arm swing - left    Ambulation Surface  Level;Indoor      Neuro Re-ed    Neuro Re-ed Details   At countertop: Completed reciprocal forward stepping over orange hurdles w/o UE support, pt demo alternating pattern. Completed side stepping over orange hurdles, requiring verbal  cues for increased hip/knee flexion and foot placement.           Balance Exercises - 12/30/19 1102      Balance Exercises: Standing   Standing Eyes Opened  Narrow base of support (BOS);Foam/compliant surface;Head turns;Other reps (comment)   2 x10 reps of horizontal and vertical head turns   Standing Eyes Closed  Narrow base of support (BOS);Foam/compliant surface;3 reps;30 secs    Tandem Stance  Eyes open;Intermittent upper extremity support;3 reps;30 secs   alternating leading LE   Other Standing Exercises Comments  Add Balance Exercises to HEP, educated pt to complete in a corner within the home with a chair placed in front  for safety.         PT Education - 12/30/19 1100    Education Details  HEP Update (see patient instructions; new additions highlighted)    Person(s) Educated  Patient    Methods  Explanation;Demonstration;Handout    Comprehension  Verbalized understanding       PT Short Term Goals - 12/28/19 1828      PT SHORT TERM GOAL #1   Title  Pt will be independent with initial HEP for strengthening and balance.    Time  4    Period  Weeks    Status  New    Target Date  01/19/20      PT SHORT TERM GOAL #2   Title  FGA will be assessed and LTG written to further assess balance.    Baseline  FGA was 13/30    Time  4    Period  Weeks    Status  Achieved    Target Date  01/19/20      PT SHORT TERM GOAL #3   Title  Pt will decrease TUG from 26 sec to <20 sec for improved balance and functional mobility.    Baseline  26 sec on 12/20/19    Time  4    Period  Weeks    Status  New    Target Date  01/19/20      PT SHORT TERM GOAL #4   Title  Pt will ambulate > 300' on level surfaces without AD independently for improved household mobility.    Time  4    Period  Weeks    Status  New    Target Date  01/19/20        PT Long Term Goals - 12/28/19 1828      PT LONG TERM GOAL #1   Title  Pt will be independent with progressive HEP for strength, and balance to continue gains on own.    Time  8    Period  Weeks    Status  New      PT LONG TERM GOAL #2   Title  Pt will increase gait speed form 0.75m/s to >0.50m/s for improved gait safety.    Baseline  0.21m/s on 12/20/19    Time  8    Period  Weeks    Status  New      PT LONG TERM GOAL #3   Title  Pt will ambulate >500' on varied surfaces independently for improved household mobility.    Time  8    Period  Weeks    Status  New      PT LONG TERM GOAL #4   Title  Pt will increase FGA from 13 to >19/30 for improved balance and decreased fall risk.    Baseline  13/30  Time  8    Period  Weeks    Status  New             Plan - 12/30/19 1249    Clinical Impression Statement  Continued gait training in today's therapy sessionm with continued focus on improve step length and increased distances to further promote endurance. Completed balance activites, and added to HEP for further improvements outside of therapy session. Pt will continue to benefit from skilled PT services to address balance, strength, and overall functional mobility.    Personal Factors and Comorbidities  Comorbidity 3+    Comorbidities  paroxysmal A. 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    Examination-Activity Limitations  Stairs;Locomotion Level;Transfers    Examination-Participation Restrictions  Community Activity;Yard Work    Conservation officer, historic buildings  Evolving/Moderate complexity    Rehab Potential  Good    PT Frequency  2x / week   plus eval   PT Duration  8 weeks    PT Treatment/Interventions  ADLs/Self Care Home Management;Cryotherapy;Moist Heat;DME Instruction;Gait training;Stair training;Functional mobility training;Therapeutic activities;Neuromuscular re-education;Balance training;Therapeutic exercise;Patient/family education;Passive range of motion;Manual techniques;Vestibular    PT Next Visit Plan  Gait training working on increasing step length,arm swing, and improved ambulation distances for endurance. Balance training. How is HEP additions?    Consulted and Agree with Plan of Care  Patient       Patient will benefit from skilled therapeutic intervention in order to improve the following deficits and impairments:  Abnormal gait, Decreased activity tolerance, Decreased knowledge of use of DME, Decreased strength, Decreased mobility, Impaired sensation, Decreased balance  Visit Diagnosis: Other abnormalities of gait and mobility  Muscle weakness (generalized)  Hemiplegia and  hemiparesis following cerebral infarction affecting left non-dominant side [facility])     Problem List Patient Active Problem List   Diagnosis Date Noted  . History of CVA (cerebrovascular accident) 12/27/2019  . Bilateral leg edema 12/27/2019  . Anemia 12/19/2019  . Basal ganglia stroke (HCC) 12/10/2019  . Left-sided weakness   . Slow transit constipation   . Pain in toes of both feet   . Cerebral embolism with cerebral infarction 12/04/2019  . CVA (cerebral vascular accident) (HCC) 12/04/2019  . Acute left-sided weakness 12/03/2019  . Mixed diabetic hyperlipidemia associated with type 2 diabetes mellitus (HCC) 12/03/2019  . Paroxysmal atrial fibrillation (HCC)   . Secondary hypercoagulable state (HCC) 09/28/2019  . Atrial fibrillation with rapid ventricular response (HCC) 09/20/2019  . CAP (community acquired pneumonia) 09/20/2019  . Obesity 05/12/2015  . Uncontrolled type 2 diabetes mellitus with hypoglycemia without coma, with long-term current use of insulin (HCC) 02/04/2008  . HLD (hyperlipidemia) 02/04/2008  . Essential hypertension 02/04/2008  . ALLERGIC RHINITIS 02/04/2008  . 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 02/04/2008    Tempie Donning, PT, DPT 12/30/2019, 12:53 PM  [facility] [address] [place], [place], [address] Phone: [phone]   Fax:  [phone]  Name: Brandon Sanchez MRN: [id number] Date of Birth: 1945-11-13

## 2019-12-30 NOTE — Patient Instructions (Signed)
Access Code: [email or web address] URL: [email or web address] Date: 12/23/2019 Prepared by: Jethro Bastos  Exercises Toe Raises with Counter Support - 1 x daily - 7 x weekly - 2 sets - 10 reps Heel rises with counter support - 1 x daily - 7 x weekly - 2 sets - 10 reps Standing Hip Abduction with Counter Support - 1 x daily - 7 x weekly - 2 sets - 10 reps Standing Hip Extension with Counter Support - 1 x daily - 7 x weekly - 2 sets - 10 reps Seated Hamstring Stretch - 1 x daily - 7 x weekly - 1 sets - 3 reps - 30 hold Romberg Stance Eyes Closed on Foam Pad - 1 x daily - 7 x weekly - 1 sets - 3 reps - 30 hold Romberg Stance with Head Nods on Foam Pad - 1 x daily - 7 x weekly - 1 sets - 3 reps - 30 hold Tandem Stance - 1 x daily - 7 x weekly - 1 sets - 3 reps - 30 hold

## 2020-01-03 ENCOUNTER — Other Ambulatory Visit: Payer: Self-pay

## 2020-01-03 ENCOUNTER — Ambulatory Visit: Payer: Medicare Other

## 2020-01-03 DIAGNOSIS — I69354 Hemiplegia and hemiparesis following cerebral infarction affecting left non-dominant side: Secondary | ICD-10-CM | POA: Diagnosis not present

## 2020-01-03 DIAGNOSIS — M6281 Muscle weakness (generalized): Secondary | ICD-10-CM | POA: Diagnosis not present

## 2020-01-03 DIAGNOSIS — R2689 Other abnormalities of gait and mobility: Secondary | ICD-10-CM

## 2020-01-03 NOTE — Therapy (Signed)
[facility] [address] [place], [place], [address] Phone: [phone]   Fax:  [phone]  Physical Therapy Treatment  Patient Details  Name: Brandon Sanchez MRN: [id number] Date of Birth: [birth year] Referring Provider (PT): Delle Reining   Encounter Date: 01/03/2020  PT End of Session - 01/03/20 1016    Visit Number  5    Number of Visits  17    Date for PT Re-Evaluation  03/19/20   90 day cert, 60 day poc   Authorization Type  medicare so 10th visit progress note, FOTO    PT Start Time  1015    PT Stop Time  1055    PT Time Calculation (min)  40 min    Equipment Utilized During Treatment  Gait belt    Activity Tolerance  Patient tolerated treatment well    Behavior During Therapy  [facility] for tasks assessed/performed       Past Medical History:  Diagnosis Date  . Allergic rhinitis, cause unspecified   . Asthma   . Cataract cortical, senile, bilateral   . Macular degeneration of left eye   . Other and unspecified hyperlipidemia   . Type II or unspecified type diabetes mellitus without mention of complication, not stated as uncontrolled   . Unspecified essential hypertension     Past Surgical History:  Procedure Laterality Date  . CARDIOVERSION N/A 09/22/2019   Procedure: CARDIOVERSION;  Surgeon: [name], MD;  Location: [facility] ENDOSCOPY;  Service: Endoscopy;  Laterality: N/A;  . CARDIOVERSION N/A 10/18/2019   Procedure: CARDIOVERSION;  Surgeon: Jake Bathe, MD;  Location: [facility] ENDOSCOPY;  Service: Cardiovascular;  Laterality: N/A;  . TEE WITHOUT CARDIOVERSION N/A 09/22/2019   Procedure: TRANSESOPHAGEAL ECHOCARDIOGRAM (TEE);  Surgeon: [name], MD;  Location: [facility] ENDOSCOPY;  Service: Endoscopy;  Laterality: N/A;    There were no vitals filed for this visit.  Subjective Assessment - 01/03/20 1016    Subjective  Pt reports that he is doing well. Has appointment with Dr. Wynn Banker tomorrow  and neurologist next week. Pt reports he was able to roll his garbage can down his drive this morning and did well.    Pertinent History  Pt is a 74 yo male presenting with slurred speech and L-sided weakness.  PMH includes: paroxysmal A. 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    Patient Stated Goals  Pt would like to be able to walk further.    Currently in Pain?  No/denies    Pain Onset  More than a month ago                        [facility] Adult PT Treatment/Exercise - 01/03/20 1019      Transfers   Transfers  Sit to Stand;Stand to Sit    Sit to Stand  6: Modified independent (Device/Increase time)    Stand to Sit  6: Modified independent (Device/Increase time)      Ambulation/Gait   Ambulation/Gait  Yes    Ambulation/Gait Assistance  5: Supervision    Ambulation/Gait Assistance Details  Pt was cued to try to increase step length and relax arms for more trunk rotation which patient was able to do.    Ambulation Distance (Feet)  550 Feet    Assistive device  None    Gait Pattern  Step-through pattern    Ambulation  Surface  Level;Indoor    Gait velocity  11.87 sec=0.84 sec      Neuro Re-ed    Neuro Re-ed Details   Standing in corner: feet together on pillow eyes closed 30 sec x 2, feet together on pillow with head turns up/down and left/right x 10 each, tandem stance 30 sec x 2 each position. By // bars: marching gait over blue mat 6' x 4 with cues to go slow and controlled, side stepping over blue mat 6' x 6, backwards gait and then forward march 6' x 6. Pt had decreased step with left foot. Standing on rockerboard  each position trying to maintain level 30 sec x 2 with occasional UE support CGA then light UE support on // bars weight shifting x 10 each position.             PT Education - 01/03/20 1206    Education Details  PT instructed pt  to work on backwards walking along counter versus static hip extension.    Person(s) Educated  Patient    Methods  Explanation;Demonstration    Comprehension  Verbalized understanding;Returned demonstration       PT Short Term Goals - 12/28/19 1828      PT SHORT TERM GOAL #1   Title  Pt will be independent with initial HEP for strengthening and balance.    Time  4    Period  Weeks    Status  New    Target Date  01/19/20      PT SHORT TERM GOAL #2   Title  FGA will be assessed and LTG written to further assess balance.    Baseline  FGA was 13/30    Time  4    Period  Weeks    Status  Achieved    Target Date  01/19/20      PT SHORT TERM GOAL #3   Title  Pt will decrease TUG from 26 sec to <20 sec for improved balance and functional mobility.    Baseline  26 sec on 12/20/19    Time  4    Period  Weeks    Status  New    Target Date  01/19/20      PT SHORT TERM GOAL #4   Title  Pt will ambulate > 300' on level surfaces without AD independently for improved household mobility.    Time  4    Period  Weeks    Status  New    Target Date  01/19/20        PT Long Term Goals - 01/03/20 1207      PT LONG TERM GOAL #1   Title  Pt will be independent with progressive HEP for strength, and balance to continue gains on own.    Time  8    Period  Weeks    Status  New      PT LONG TERM GOAL #2   Title  Pt will increase gait speed form 0.28m/s to >0.36m/s for improved gait safety.    Baseline  0.78m/s on 12/20/19, 01/03/20 gait speed=.045m/s    Time  8    Period  Weeks    Status  Achieved      PT LONG TERM GOAL #3   Title  Pt will ambulate >500' on varied surfaces independently for improved household mobility.    Time  8    Period  Weeks    Status  New  PT LONG TERM GOAL #4   Title  Pt will increase FGA from 13 to >19/30 for improved balance and decreased fall risk.    Baseline  13/30    Time  8    Period  Weeks    Status  New            Plan - 01/03/20 1207     Clinical Impression Statement  Pt continues to show improvement in step length with gait. Gait speed much improved up to 0.10m/s meeting LTG for this and indicating improved safety with community mobility. Pt was challenged on rockerboard.    Personal Factors and Comorbidities  Comorbidity 3+    Comorbidities  paroxysmal A. 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    Examination-Activity Limitations  Stairs;Locomotion Level;Transfers    Examination-Participation Restrictions  Community Activity;Yard Work    Conservation officer, historic buildings  Evolving/Moderate complexity    Rehab Potential  Good    PT Frequency  2x / week   plus eval   PT Duration  8 weeks    PT Treatment/Interventions  ADLs/Self Care Home Management;Cryotherapy;Moist Heat;DME Instruction;Gait training;Stair training;Functional mobility training;Therapeutic activities;Neuromuscular re-education;Balance training;Therapeutic exercise;Patient/family education;Passive range of motion;Manual techniques;Vestibular    PT Next Visit Plan  Gait training working on increasing step length,arm swing, and improved ambulation distances for endurance. Balance training. How is HEP additions?    Consulted and Agree with Plan of Care  Patient       Patient will benefit from skilled therapeutic intervention in order to improve the following deficits and impairments:  Abnormal gait, Decreased activity tolerance, Decreased knowledge of use of DME, Decreased strength, Decreased mobility, Impaired sensation, Decreased balance  Visit Diagnosis: Other abnormalities of gait and mobility  Muscle weakness (generalized)     Problem List Patient Active Problem List   Diagnosis Date Noted  . History of CVA (cerebrovascular accident) 12/27/2019  . Bilateral leg edema 12/27/2019  . Anemia 12/19/2019  . Basal ganglia stroke (HCC)  12/10/2019  . Left-sided weakness   . Slow transit constipation   . Pain in toes of both feet   . Cerebral embolism with cerebral infarction 12/04/2019  . CVA (cerebral vascular accident) (HCC) 12/04/2019  . Acute left-sided weakness 12/03/2019  . Mixed diabetic hyperlipidemia associated with type 2 diabetes mellitus (HCC) 12/03/2019  . Paroxysmal atrial fibrillation (HCC)   . Secondary hypercoagulable state (HCC) 09/28/2019  . Atrial fibrillation with rapid ventricular response (HCC) 09/20/2019  . CAP (community acquired pneumonia) 09/20/2019  . Obesity 05/12/2015  . Uncontrolled type 2 diabetes mellitus with hypoglycemia without coma, with long-term current use of insulin (HCC) 02/04/2008  . HLD (hyperlipidemia) 02/04/2008  . Essential hypertension 02/04/2008  . ALLERGIC RHINITIS 02/04/2008  . 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 02/04/2008    Ronn Melena, PT, DPT, NCS 01/03/2020, 12:09 PM  [facility] [address] [place], [place], [address] Phone: [phone]   Fax:  [phone]  Name: Brandon Sanchez MRN: [id number] Date of Birth: Jan 15, 1946

## 2020-01-04 ENCOUNTER — Encounter: Payer: Self-pay | Admitting: Registered Nurse

## 2020-01-04 ENCOUNTER — Encounter: Payer: Medicare Other | Attending: Registered Nurse | Admitting: Registered Nurse

## 2020-01-04 VITALS — BP 109/70 | HR 77 | Temp 97.2°F | Ht 75.0 in | Wt 279.0 lb

## 2020-01-04 DIAGNOSIS — I63131 Cerebral infarction due to embolism of right carotid artery: Secondary | ICD-10-CM

## 2020-01-04 DIAGNOSIS — R531 Weakness: Secondary | ICD-10-CM | POA: Diagnosis not present

## 2020-01-04 DIAGNOSIS — Z794 Long term (current) use of insulin: Secondary | ICD-10-CM | POA: Insufficient documentation

## 2020-01-04 DIAGNOSIS — R600 Localized edema: Secondary | ICD-10-CM | POA: Diagnosis not present

## 2020-01-04 DIAGNOSIS — E11649 Type 2 diabetes mellitus with hypoglycemia without coma: Secondary | ICD-10-CM | POA: Diagnosis not present

## 2020-01-04 NOTE — Progress Notes (Signed)
Subjective:    Patient ID: Brandon Sanchez, male    DOB: [birth year], 74 y.o.   MRN: [id number]  HPI: Brandon Sanchez is a 74 y.o. 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 He presented to [facility] Emergency Department on 12/03/2019 via EMS with complaints of Left- Sided weakness and left facial  Droop. Neurology was consulted:Low dose ASA was added to Eliquis for stroke prevention. CT Head WO Contrast:  IMPRESSION: 1. No acute intracranial abnormality. 2. Mild generalized atrophy and white matter disease. This likely reflects the sequela of chronic microvascular ischemia. 3. ASPECTS is 10/10. 4. Mild sinus disease.  CTA Head W/WO Contrast:  IMPRESSION: 1. No emergent large vessel occlusion 2. Atherosclerotic changes in the distal common carotid arteries and at the carotid bifurcations bilaterally significant stenosis. 3.  Aortic Atherosclerosis (ICD10-I70.0). 4. Degenerative changes of the cervical spine including fusion at C3-4 and C6-7. MRI Brain WO Contrast:  IMPRESSION: 1. 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 2. Mild chronic small vessel ischemic disease. 3. Negative head MRA.  He was admitted t Inpatient rehabilitation on 12/10/2019 and discharged home on 12/17/2019. He is receiving Outpatient Therapy at [facility]. He denies pain and rated his pain 0. Also reports he has a good appetite.   This provider spoke with his daughter Brandon Sanchez on the phone and all questions was answered.   Brandon Sanchez would like to resume driving,  Cognitive testing was performed, this was discussed with Dr Carlis Abbott since Dr Wynn Banker is on vacation. Dr. Marijean Niemann also performed cognitive testing. This will be discussed with Dr Wynn Banker and this provider  will call him on Thursday, he verbalizes understanding. Dr. Carlis Abbott discussed with Brandon Sanchez about having family accompany him while driving, he states he doesn't have family or friends here to assist him with the above. He is paying for transportation at this time. His daughter lives in [place], the above will be discussed with Dr Wynn Banker, he verbalizes understanding.    Pain Inventory Average Pain 1 Pain Right Now 0 My pain is dull  In the last 24 hours, has pain interfered with the following? General activity 0 Relation with others 0 Enjoyment of life 0 What TIME of day is your pain at its worst? daytime Sleep (in general) Good  Pain is worse with: walking Pain improves with: pacing activities Relief from Meds: 0  Mobility walk without assistance how many minutes can you walk? 5 ability to climb steps?  yes do you drive?  no  Function retired  Neuro/Psych bladder control problems weakness tremor  Prior Studies Any changes since last visit?  no  Physicians involved in your care Any changes since last visit?  no   Family History  Problem Relation Age of Onset  . Colon cancer Father   . Heart disease Paternal Grandfather    Social History   Socioeconomic History  . Marital status: Divorced    Spouse name: Not on file  . Number of children: Not on file  . Years of education: Not on file  . Highest education level: Professional school degree (e.g., MD, DDS, DVM, JD)  Occupational History  . Occupation: Armed forces logistics/support/administrative officer  Tobacco Use  . Smoking status: Never Smoker  . Smokeless tobacco: Never Used  Substance and Sexual Activity  .  Alcohol use: Not Currently    Comment: Used to drink 2 L bottle of wine/day. Quit 09/18/19  . Drug use: Never  . Sexual activity: Not Currently  Other Topics Concern  . Not on file  Social History Narrative  . Not on file   Social Determinants of Health   Financial Resource Strain:   . Difficulty of Paying Living  Expenses:   Food Insecurity: No Food Insecurity  . Worried About Programme researcher, broadcasting/film/video in the Last Year: Never true  . Ran Out of Food in the Last Year: Never true  Transportation Needs: No Transportation Needs  . Lack of Transportation (Medical): No  . Lack of Transportation (Non-Medical): No  Physical Activity:   . Days of Exercise per Week:   . Minutes of Exercise per Session:   Stress:   . Feeling of Stress :   Social Connections:   . Frequency of Communication with Friends and Family:   . Frequency of Social Gatherings with Friends and Family:   . Attends Religious Services:   . Active Member of Clubs or Organizations:   . Attends Banker Meetings:   Marland Kitchen Marital Status:    Past Surgical History:  Procedure Laterality Date  . CARDIOVERSION N/A 09/22/2019   Procedure: CARDIOVERSION;  Surgeon: [name], MD;  Location: [facility] ENDOSCOPY;  Service: Endoscopy;  Laterality: N/A;  . CARDIOVERSION N/A 10/18/2019   Procedure: CARDIOVERSION;  Surgeon: Jake Bathe, MD;  Location: [facility] ENDOSCOPY;  Service: Cardiovascular;  Laterality: N/A;  . TEE WITHOUT CARDIOVERSION N/A 09/22/2019   Procedure: TRANSESOPHAGEAL ECHOCARDIOGRAM (TEE);  Surgeon: [name], MD;  Location: [facility] ENDOSCOPY;  Service: Endoscopy;  Laterality: N/A;   Past Medical History:  Diagnosis Date  . Allergic rhinitis, cause unspecified   . Asthma   . Cataract cortical, senile, bilateral   . Macular degeneration of left eye   . Other and unspecified hyperlipidemia   . Type II or unspecified type diabetes mellitus without mention of complication, not stated as uncontrolled   . Unspecified essential hypertension    BP 109/70   Pulse 77   Temp (!) 97.2 F (36.2 C)   Ht 6\' 3"  (1.905 m)   Wt 279 lb (126.6 kg)   SpO2 95%   BMI 34.87 kg/m   Opioid Risk Score:   Fall Risk Score:  `1  Depression screen PHQ 2/9  Depression screen [facility] 2/9 01/04/2020 09/27/2019  Decreased Interest 0 0  Down,  Depressed, Hopeless 0 0  PHQ - 2 Score 0 0  Altered sleeping 0 -  Tired, decreased energy 0 -  Change in appetite 0 -  Feeling bad or failure about yourself  0 -  Trouble concentrating 0 -  Moving slowly or fidgety/restless 0 -  Suicidal thoughts 0 -  PHQ-9 Score 0 -    Review of Systems  Constitutional: Positive for unexpected weight change.  HENT: Negative.   Eyes: Negative.   Respiratory: Positive for shortness of breath.   Cardiovascular: Positive for leg swelling.  Gastrointestinal: Negative.   Endocrine: Negative.   Genitourinary: Negative.   Musculoskeletal: Negative.   Allergic/Immunologic: Negative.   Neurological: Positive for tremors and weakness.  Hematological: Negative.   Psychiatric/Behavioral: Negative.   All other systems reviewed and are negative.      Objective:   Physical Exam Vitals and nursing note reviewed.  Constitutional:      Appearance: Normal appearance.  Cardiovascular:     Rate and Rhythm: Normal  rate and regular rhythm.     Pulses: Normal pulses.     Heart sounds: Normal heart sounds.  Pulmonary:     Effort: Pulmonary effort is normal.     Breath sounds: Normal breath sounds.  Musculoskeletal:     Cervical back: Normal range of motion and neck supple.     Right lower leg: Edema present.     Left lower leg: Edema present.     Comments: Normal Muscle Bulk and Muscle Testing Reveals:  Upper Extremities: Full ROM and Muscle Strength on Right 5/5 and Left 4/5 Lower Extremities: Full ROM and Muscle Strength 5/5 Bilateral Lower Extremities with Edema Noted Arises from Table Slowly Narrow Based Gait   Skin:    General: Skin is warm and dry.  Neurological:     Mental Status: He is alert and oriented to person, place, and time.  Psychiatric:        Mood and Affect: Mood normal.        Behavior: Behavior normal.           Assessment & Plan:  1.Cerebral embolism with cerebral infarction/left sided weakness: Continue Outpatient  Therapy at [facility]. He has a scheduled appointment with [facility] Neurology.  2.Uncontrolled type 2 DM with hypoglycemia without coma, with long -term current use of insulin.Continue current Medication Regimen: PCP Following.   20  minutes of face to face patient care time was spent during this visit. All questions were encouraged and answered.  F/U in 4- 6 weeks with Dr Letta Pate.

## 2020-01-05 ENCOUNTER — Ambulatory Visit: Payer: Federal, State, Local not specified - PPO | Admitting: Cardiology

## 2020-01-05 DIAGNOSIS — N200 Calculus of kidney: Secondary | ICD-10-CM | POA: Diagnosis not present

## 2020-01-05 DIAGNOSIS — R3121 Asymptomatic microscopic hematuria: Secondary | ICD-10-CM | POA: Diagnosis not present

## 2020-01-05 DIAGNOSIS — N3281 Overactive bladder: Secondary | ICD-10-CM | POA: Diagnosis not present

## 2020-01-06 ENCOUNTER — Other Ambulatory Visit: Payer: Self-pay

## 2020-01-06 ENCOUNTER — Ambulatory Visit: Payer: Medicare Other

## 2020-01-06 DIAGNOSIS — I69354 Hemiplegia and hemiparesis following cerebral infarction affecting left non-dominant side: Secondary | ICD-10-CM

## 2020-01-06 DIAGNOSIS — R2689 Other abnormalities of gait and mobility: Secondary | ICD-10-CM | POA: Diagnosis not present

## 2020-01-06 DIAGNOSIS — M6281 Muscle weakness (generalized): Secondary | ICD-10-CM

## 2020-01-06 NOTE — Therapy (Signed)
[facility] [address] [place], [place], [address] Phone: [phone]   Fax:  [phone]  Physical Therapy Treatment  Patient Details  Name: Brandon Sanchez MRN: [id number] Date of Birth: 1946-07-02 Referring Provider (PT): Delle Reining   Encounter Date: 01/06/2020  PT End of Session - 01/06/20 1115    Visit Number  6    Number of Visits  17    Date for PT Re-Evaluation  03/19/20   90 day cert, 60 day poc   Authorization Type  medicare so 10th visit progress note, FOTO    PT Start Time  1015    PT Stop Time  1059    PT Time Calculation (min)  44 min    Equipment Utilized During Treatment  Gait belt    Activity Tolerance  Patient tolerated treatment well    Behavior During Therapy  [facility] for tasks assessed/performed       Past Medical History:  Diagnosis Date  . Allergic rhinitis, cause unspecified   . Asthma   . Cataract cortical, senile, bilateral   . Macular degeneration of left eye   . Other and unspecified hyperlipidemia   . Type II or unspecified type diabetes mellitus without mention of complication, not stated as uncontrolled   . Unspecified essential hypertension     Past Surgical History:  Procedure Laterality Date  . CARDIOVERSION N/A 09/22/2019   Procedure: CARDIOVERSION;  Surgeon: [name], MD;  Location: [facility] ENDOSCOPY;  Service: Endoscopy;  Laterality: N/A;  . CARDIOVERSION N/A 10/18/2019   Procedure: CARDIOVERSION;  Surgeon: Jake Bathe, MD;  Location: [facility] ENDOSCOPY;  Service: Cardiovascular;  Laterality: N/A;  . TEE WITHOUT CARDIOVERSION N/A 09/22/2019   Procedure: TRANSESOPHAGEAL ECHOCARDIOGRAM (TEE);  Surgeon: [name], MD;  Location: [facility] ENDOSCOPY;  Service: Endoscopy;  Laterality: N/A;    There were no vitals filed for this visit.  Subjective Assessment - 01/06/20 1017    Subjective  Patient reportings doing well, no new issues. Reports that he had appt at Dr.  Wynn Sanchez tomorrow where they spoke on gradually increasing driving in the near future.    Pertinent History  Pt is a 74 yo male presenting with slurred speech and L-sided weakness.  PMH includes: paroxysmal A. 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    Patient Stated Goals  Pt would like to be able to walk further.    Currently in Pain?  No/denies    Pain Onset  More than a month ago                        [facility] Adult PT Treatment/Exercise - 01/06/20 1043      Transfers   Transfers  Sit to Stand;Stand to Sit    Sit to Stand  6: Modified independent (Device/Increase time)    Stand to Sit  6: Modified independent (Device/Increase time)      Ambulation/Gait   Ambulation/Gait  Yes    Ambulation/Gait Assistance  5: Supervision    Ambulation/Gait Assistance Details  supv for safety, vebral cues for improved arm swing.     Ambulation Distance (Feet)  1050 Feet   1 x 400 ft, 1 x 650 ft.    Assistive device  None    Gait Pattern  Step-through pattern    Ambulation Surface  Level;Indoor    Gait Comments  Completed  gait training focused on increased distance and improved endurance, 1 x 400 ft, 1 x 650 ft. Rest break of 2 minutes between. Rated RPE at end of completion of both at 6-7/10. Mild SOB.       Neuro Re-ed    Neuro Re-ed Details   Completed reciprocal stepping over obstacles (orange hurdles) x 6 laps. Pt requiring verbal cues once for improved hip/knee flexion, overall patient demonstrating improved toe clearnace with stepping over obstacle in today's session. In // bars, completed standing on blue foam beam 2 x 1 min each w/o UE support.           Balance Exercises - 01/06/20 1111      Balance Exercises: Standing   Rockerboard  Anterior/posterior;EO;Limitations    Rockerboard Limitations  focused on holding steady 2 x 1 min.     Tandem Gait   Forward;Intermittent upper extremity support;4 reps;Limitations    Tandem Gait Limitations  x 6 laps in //  bars, initially use light UE support, completed final 4 laps w/o UE support    Other Standing Exercises  On rockerboard (positioned ant/post), completed alternating toe taps to 4" step w/ 1 UE in // bars, 2 x 10 reps.         PT Education - 01/06/20 1114    Education Details  Educated on initiating walking program at home on level surfaces for improved endurance.    Person(s) Educated  Patient    Methods  Explanation    Comprehension  Verbalized understanding       PT Short Term Goals - 12/28/19 1828      PT SHORT TERM GOAL #1   Title  Pt will be independent with initial HEP for strengthening and balance.    Time  4    Period  Weeks    Status  New    Target Date  01/19/20      PT SHORT TERM GOAL #2   Title  FGA will be assessed and LTG written to further assess balance.    Baseline  FGA was 13/30    Time  4    Period  Weeks    Status  Achieved    Target Date  01/19/20      PT SHORT TERM GOAL #3   Title  Pt will decrease TUG from 26 sec to <20 sec for improved balance and functional mobility.    Baseline  26 sec on 12/20/19    Time  4    Period  Weeks    Status  New    Target Date  01/19/20      PT SHORT TERM GOAL #4   Title  Pt will ambulate > 300' on level surfaces without AD independently for improved household mobility.    Time  4    Period  Weeks    Status  New    Target Date  01/19/20        PT Long Term Goals - 01/03/20 1207      PT LONG TERM GOAL #1   Title  Pt will be independent with progressive HEP for strength, and balance to continue gains on own.    Time  8    Period  Weeks    Status  New      PT LONG TERM GOAL #2   Title  Pt will increase gait speed form 0.27m/s to >0.27m/s for improved gait safety.    Baseline  0.69m/s on 12/20/19, 01/03/20 gait speed=.056m/s  Time  8    Period  Weeks    Status  Achieved      PT LONG TERM GOAL #3    Title  Pt will ambulate >500' on varied surfaces independently for improved household mobility.    Time  8    Period  Weeks    Status  New      PT LONG TERM GOAL #4   Title  Pt will increase FGA from 13 to >19/30 for improved balance and decreased fall risk.    Baseline  13/30    Time  8    Period  Weeks    Status  New            Plan - 01/06/20 1120    Clinical Impression Statement  Today's skilled session included further gait training focused on improved endurance with increasing distances. Overall patient continue to demo improvements with step length, still require verbal cues for improved arm swing. Patient continues to demo progress toward goals and will continue to benefit from skilled PT services.    Personal Factors and Comorbidities  Comorbidity 3+    Comorbidities  paroxysmal A. 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    Examination-Activity Limitations  Stairs;Locomotion Level;Transfers    Examination-Participation Restrictions  Community Activity;Yard Work    Conservation officer, historic buildings  Evolving/Moderate complexity    Rehab Potential  Good    PT Frequency  2x / week   plus eval   PT Duration  8 weeks    PT Treatment/Interventions  ADLs/Self Care Home Management;Cryotherapy;Moist Heat;DME Instruction;Gait training;Stair training;Functional mobility training;Therapeutic activities;Neuromuscular re-education;Balance training;Therapeutic exercise;Patient/family education;Passive range of motion;Manual techniques;Vestibular    PT Next Visit Plan  Able to start walking program? continue gait training (step length, arm swing). Balance Training. Attempt using poles with gait for improved arm swing?    Consulted and Agree with Plan of Care  Patient       Patient will benefit from skilled therapeutic intervention in order to improve the following  deficits and impairments:  Abnormal gait, Decreased activity tolerance, Decreased knowledge of use of DME, Decreased strength, Decreased mobility, Impaired sensation, Decreased balance  Visit Diagnosis: Other abnormalities of gait and mobility  Muscle weakness (generalized)  Hemiplegia and hemiparesis following cerebral infarction affecting left non-dominant side [facility])     Problem List Patient Active Problem List   Diagnosis Date Noted  . History of CVA (cerebrovascular accident) 12/27/2019  . Bilateral leg edema 12/27/2019  . Anemia 12/19/2019  . Basal ganglia stroke (HCC) 12/10/2019  . Left-sided weakness   . Slow transit constipation   . Pain in toes of both feet   . Cerebral embolism with cerebral infarction 12/04/2019  . CVA (cerebral vascular accident) (HCC) 12/04/2019  . Acute left-sided weakness 12/03/2019  . Mixed diabetic hyperlipidemia associated with type 2 diabetes mellitus (HCC) 12/03/2019  . Paroxysmal atrial fibrillation (HCC)   . Secondary hypercoagulable state (HCC) 09/28/2019  . Atrial fibrillation with rapid ventricular response (HCC) 09/20/2019  . CAP (community acquired pneumonia) 09/20/2019  . Obesity 05/12/2015  . Uncontrolled type 2 diabetes mellitus with hypoglycemia without coma, with long-term current use of insulin (HCC) 02/04/2008  . HLD (hyperlipidemia) 02/04/2008  . Essential hypertension 02/04/2008  . ALLERGIC RHINITIS 02/04/2008  . 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 02/04/2008    Tempie Donning, PT, DPT 01/06/2020, 11:28 AM  [facility] [address] [place], [place], [address] Phone: [phone]   Fax:  [phone]  Name: LAMARCUS SPIRA MRN: [id number] Date of Birth: 04-22-1946

## 2020-01-10 ENCOUNTER — Ambulatory Visit: Payer: Medicare Other

## 2020-01-10 ENCOUNTER — Other Ambulatory Visit: Payer: Self-pay

## 2020-01-10 DIAGNOSIS — R2689 Other abnormalities of gait and mobility: Secondary | ICD-10-CM | POA: Diagnosis not present

## 2020-01-10 DIAGNOSIS — M6281 Muscle weakness (generalized): Secondary | ICD-10-CM

## 2020-01-10 DIAGNOSIS — I69354 Hemiplegia and hemiparesis following cerebral infarction affecting left non-dominant side: Secondary | ICD-10-CM | POA: Diagnosis not present

## 2020-01-10 NOTE — Therapy (Signed)
[facility] [address] [place], [place], [address] Phone: [phone]   Fax:  [phone]  Physical Therapy Treatment  Patient Details  Name: Brandon Sanchez MRN: [id number] Date of Birth: 05/20/1946 Referring Provider (PT): Reesa Chew   Encounter Date: 01/10/2020  PT End of Session - 01/10/20 1021    Visit Number  7    Number of Visits  17    Date for PT Re-Evaluation  66/06/30   90 day cert, 60 day poc   Authorization Type  medicare so 10th visit progress note, FOTO    PT Start Time  1020   PT running a little behind   PT Stop Time  1059    PT Time Calculation (min)  39 min    Equipment Utilized During Treatment  Gait belt    Activity Tolerance  Patient tolerated treatment well    Behavior During Therapy  WFL for tasks assessed/performed       Past Medical History:  Diagnosis Date  . Allergic rhinitis, cause unspecified   . Asthma   . Cataract cortical, senile, bilateral   . Macular degeneration of left eye   . Other and unspecified hyperlipidemia   . Type II or unspecified type diabetes mellitus without mention of complication, not stated as uncontrolled   . Unspecified essential hypertension     Past Surgical History:  Procedure Laterality Date  . CARDIOVERSION N/A 09/22/2019   Procedure: CARDIOVERSION;  Surgeon: Donato Heinz, MD;  Location: [facility] ENDOSCOPY;  Service: Endoscopy;  Laterality: N/A;  . CARDIOVERSION N/A 10/18/2019   Procedure: CARDIOVERSION;  Surgeon: Jerline Pain, MD;  Location: [facility] ENDOSCOPY;  Service: Cardiovascular;  Laterality: N/A;  . TEE WITHOUT CARDIOVERSION N/A 09/22/2019   Procedure: TRANSESOPHAGEAL ECHOCARDIOGRAM (TEE);  Surgeon: Donato Heinz, MD;  Location[facility] ENDOSCOPY;  Service: Endoscopy;  Laterality: N/A;    There were no vitals filed for this visit.  Subjective Assessment - 01/10/20 1021    Subjective  Pt reports he has been doing well. Exercises  going well. Has been walking some. Pt feels he is doing as good as he was prior to CVA.    Pertinent History  Pt is a 74 yo male presenting with slurred speech and L-sided weakness.  PMH includes: paroxysmal A. 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    Patient Stated Goals  Pt would like to be able to walk further.    Currently in Pain?  No/denies    Pain Onset  More than a month ago         [facility] PT Assessment - 01/10/20 1023      Functional Gait  Assessment   Gait assessed   Yes    Gait Level Surface  Walks 20 ft in less than 7 sec but greater than 5.5 sec, uses assistive device, slower speed, mild gait deviations, or deviates 6-10 in outside of the 12 in walkway width.    Change in Gait Speed  Able to smoothly change walking speed without loss of balance or gait deviation. Deviate no more than 6 in outside of the 12 in walkway width.    Gait with Horizontal Head Turns  Performs head turns smoothly with no change in gait. Deviates no more than 6 in outside 12 in walkway width    Gait with Vertical Head Turns  Performs head turns with no change  in gait. Deviates no more than 6 in outside 12 in walkway width.    Gait and Pivot Turn  Pivot turns safely within 3 sec and stops quickly with no loss of balance.    Step Over Obstacle  Is able to step over 2 stacked shoe boxes taped together (9 in total height) without changing gait speed. No evidence of imbalance.    Gait with Narrow Base of Support  Is able to ambulate for 10 steps heel to toe with no staggering.    Gait with Eyes Closed  Walks 20 ft, uses assistive device, slower speed, mild gait deviations, deviates 6-10 in outside 12 in walkway width. Ambulates 20 ft in less than 9 sec but greater than 7 sec.    Ambulating Backwards  Walks 20 ft, uses assistive device, slower speed, mild gait deviations, deviates 6-10 in  outside 12 in walkway width.    Steps  Two feet to a stair, must use rail.    Total Score  25                    [facility] Adult PT Treatment/Exercise - 01/10/20 1023      Transfers   Transfers  Sit to Stand;Stand to Sit    Sit to Stand  6: Modified independent (Device/Increase time)    Stand to Sit  6: Modified independent (Device/Increase time)      Ambulation/Gait   Ambulation/Gait  Yes    Ambulation/Gait Assistance  7: Independent;5: Supervision    Ambulation/Gait Assistance Details  Pt with better arm swing today after cuing with larger step length. First bout was on level surface inside. Second bout outside. Pt was independent on level surface inside and supervision outside. Pt had a couple scuffs outside on left foot. He was able to keep balance. Was instructed to increase foot clearance on grass.     Ambulation Distance (Feet)  230 Feet   850' x1   Assistive device  None    Gait Pattern  Step-through pattern    Ambulation Surface  Level;Indoor;Outdoor;Paved;Grass      Standardized Balance Assessment   Standardized Balance Assessment  Timed Up and Go Test      Timed Up and Go Test   TUG  Normal TUG    Normal TUG (seconds)  13.6   13.9 and 13.3     Neuro Re-ed    Neuro Re-ed Details   Standing balance corner exercises: standing in corner on pillow feet together x 30 sec eyes closed then with head turn x 10 up/down and left/right x 10 each. Tandem stance x 30 sec each position. Increased sway with tandem especially with right leg posterior.             PT Education - 01/10/20 1712    Education Details  Discussed results of retesting with TUG and FGA indicating lower fall risk. Discussed possible early d/c the next 2-3 visits as progressing faster than anticipated.    Person(s) Educated  Patient    Methods  Explanation    Comprehension  Verbalized understanding       PT Short Term Goals - 01/10/20 1038      PT SHORT TERM GOAL #1   Title  Pt will be  independent with initial HEP for strengthening and balance.    Baseline  Pt has been performing his initial HEP as prescribed.    Time  4    Period  Weeks    Status  Achieved    Target Date  01/19/20      PT SHORT TERM GOAL #2   Title  FGA will be assessed and LTG written to further assess balance.    Baseline  FGA was 13/30    Time  4    Period  Weeks    Status  Achieved    Target Date  01/19/20      PT SHORT TERM GOAL #3   Title  Pt will decrease TUG from 26 sec to <20 sec for improved balance and functional mobility.    Baseline  26 sec on 12/20/19, 01/10/20 TUG=13.6 sec    Time  4    Period  Weeks    Status  Achieved    Target Date  01/19/20      PT SHORT TERM GOAL #4   Title  Pt will ambulate > 300' on level surfaces without AD independently for improved household mobility.    Baseline  Pt has met this goal for level surfaces.    Time  4    Period  Weeks    Status  Achieved    Target Date  01/19/20        PT Long Term Goals - 01/10/20 1049      PT LONG TERM GOAL #1   Title  Pt will be independent with progressive HEP for strength, and balance to continue gains on own.    Time  8    Period  Weeks    Status  New      PT LONG TERM GOAL #2   Title  Pt will increase gait speed form 0.52ms to >0.653m for improved gait safety.    Baseline  0.4325mon 12/20/19, 01/03/20 gait speed=.13m49m  Time  8    Period  Weeks    Status  Achieved      PT LONG TERM GOAL #3   Title  Pt will ambulate >500' on varied surfaces independently for improved household mobility.    Time  8    Period  Weeks    Status  New      PT LONG TERM GOAL #4   Title  Pt will increase FGA from 13 to >19/30 for improved balance and decreased fall risk.    Baseline  13/30, 01/10/20 FGA=25/30    Time  8    Period  Weeks    Status  Achieved            Plan - 01/10/20 1713    Clinical Impression Statement  PT reassessed goals as patient progressing faster than anticipated. Improved on TUG and FGA  scores significantly indicating lower fall risk. Pt did well with gait on level surfaces meeting STG for gait. Continues to show progress with gait and balance.    PT Frequency  2x / week    PT Duration  8 weeks    PT Treatment/Interventions  ADLs/Self Care Home Management;Cryotherapy;Moist Heat;DME Instruction;Gait training;Stair training;Functional mobility training;Therapeutic activities;Neuromuscular re-education;Balance training;Therapeutic exercise;Patient/family education;Passive range of motion;Manual techniques;Vestibular    PT Next Visit Plan  Plan to discharge early in next 2-3 vistis as progressing faster than anticipated. continue gait training (step length, arm swing). Did not need to try poles with gait as pt has improved arm swing today. Balance Training.    Consulted and Agree with Plan of Care  Patient       Patient will benefit from skilled therapeutic intervention in order to improve the following deficits and  impairments:     Visit Diagnosis: Other abnormalities of gait and mobility  Muscle weakness (generalized)     Problem List Patient Active Problem List   Diagnosis Date Noted  . History of CVA (cerebrovascular accident) 12/27/2019  . Bilateral leg edema 12/27/2019  . Anemia 12/19/2019  . Basal ganglia stroke ([place]) 12/10/2019  . Left-sided weakness   . Slow transit constipation   . Pain in toes of both feet   . Cerebral embolism with cerebral infarction 12/04/2019  . CVA (cerebral vascular accident) ([place]) 12/04/2019  . Acute left-sided weakness 12/03/2019  . Mixed diabetic hyperlipidemia associated with type 2 diabetes mellitus ([place]) 12/03/2019  . Paroxysmal atrial fibrillation (HCC)   . Secondary hypercoagulable state ([place]) 09/28/2019  . Atrial fibrillation with rapid ventricular response ([place]) 09/20/2019  . CAP (community acquired pneumonia) 09/20/2019  . Obesity 05/12/2015  . Uncontrolled type 2 diabetes mellitus with hypoglycemia without coma, with  long-term current use of insulin ([place]) 02/04/2008  . HLD (hyperlipidemia) 02/04/2008  . Essential hypertension 02/04/2008  . ALLERGIC RHINITIS 02/04/2008  . 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 52/58/9483    Electa Sniff, PT, DPT, NCS 01/10/2020, 5:16 PM  [facility] [address] [place], [place], [address] Phone: [phone]   Fax:  [phone]  Name: WESAM GEARHART MRN: [id number] Date of Birth: Sep 08, 1945

## 2020-01-11 ENCOUNTER — Inpatient Hospital Stay (HOSPITAL_COMMUNITY): Admission: RE | Admit: 2020-01-11 | Payer: Federal, State, Local not specified - PPO | Source: Ambulatory Visit

## 2020-01-11 DIAGNOSIS — Z09 Encounter for follow-up examination after completed treatment for conditions other than malignant neoplasm: Secondary | ICD-10-CM | POA: Diagnosis not present

## 2020-01-11 DIAGNOSIS — I6349 Cerebral infarction due to embolism of other cerebral artery: Secondary | ICD-10-CM | POA: Diagnosis not present

## 2020-01-11 DIAGNOSIS — I10 Essential (primary) hypertension: Secondary | ICD-10-CM | POA: Diagnosis not present

## 2020-01-11 DIAGNOSIS — E782 Mixed hyperlipidemia: Secondary | ICD-10-CM | POA: Diagnosis not present

## 2020-01-11 DIAGNOSIS — E1129 Type 2 diabetes mellitus with other diabetic kidney complication: Secondary | ICD-10-CM | POA: Diagnosis not present

## 2020-01-11 DIAGNOSIS — I480 Paroxysmal atrial fibrillation: Secondary | ICD-10-CM | POA: Diagnosis not present

## 2020-01-13 ENCOUNTER — Other Ambulatory Visit: Payer: Self-pay

## 2020-01-13 ENCOUNTER — Ambulatory Visit: Payer: Medicare Other

## 2020-01-13 DIAGNOSIS — M6281 Muscle weakness (generalized): Secondary | ICD-10-CM | POA: Diagnosis not present

## 2020-01-13 DIAGNOSIS — R2689 Other abnormalities of gait and mobility: Secondary | ICD-10-CM

## 2020-01-13 DIAGNOSIS — E1129 Type 2 diabetes mellitus with other diabetic kidney complication: Secondary | ICD-10-CM | POA: Diagnosis not present

## 2020-01-13 DIAGNOSIS — I69354 Hemiplegia and hemiparesis following cerebral infarction affecting left non-dominant side: Secondary | ICD-10-CM

## 2020-01-13 DIAGNOSIS — I10 Essential (primary) hypertension: Secondary | ICD-10-CM | POA: Diagnosis not present

## 2020-01-13 NOTE — Therapy (Signed)
[facility] [address] [place], [place], [address] Phone: [phone]   Fax:  [phone]  Physical Therapy Treatment  Patient Details  Name: Brandon Sanchez MRN: [id number] Date of Birth: 10/15/1945 Referring Provider (PT): Reesa Chew   Encounter Date: 01/13/2020  PT End of Session - 01/13/20 1020    Visit Number  8    Number of Visits  17    Date for PT Re-Evaluation  13/24/40   90 day cert, 60 day poc   Authorization Type  medicare so 10th visit progress note, FOTO    PT Start Time  1015    PT Stop Time  1100    PT Time Calculation (min)  45 min    Equipment Utilized During Treatment  Gait belt    Activity Tolerance  Patient tolerated treatment well    Behavior During Therapy  [facility] for tasks assessed/performed       Past Medical History:  Diagnosis Date  . Allergic rhinitis, cause unspecified   . Asthma   . Cataract cortical, senile, bilateral   . Macular degeneration of left eye   . Other and unspecified hyperlipidemia   . Type II or unspecified type diabetes mellitus without mention of complication, not stated as uncontrolled   . Unspecified essential hypertension     Past Surgical History:  Procedure Laterality Date  . CARDIOVERSION N/A 09/22/2019   Procedure: CARDIOVERSION;  Surgeon: Donato Heinz, MD;  Location: [facility] ENDOSCOPY;  Service: Endoscopy;  Laterality: N/A;  . CARDIOVERSION N/A 10/18/2019   Procedure: CARDIOVERSION;  Surgeon: Jerline Pain, MD;  Location: [facility] ENDOSCOPY;  Service: Cardiovascular;  Laterality: N/A;  . TEE WITHOUT CARDIOVERSION N/A 09/22/2019   Procedure: TRANSESOPHAGEAL ECHOCARDIOGRAM (TEE);  Surgeon: Donato Heinz, MD;  Location: [facility] ENDOSCOPY;  Service: Endoscopy;  Laterality: N/A;    There were no vitals filed for this visit.  Subjective Assessment - 01/13/20 1017    Subjective  Patient reporting doing well. Reports balance seems to be better. Patient  reports that he has a stationary foot bike. No pain.    Pertinent History  Pt is a 74 yo male presenting with slurred speech and L-sided weakness.  PMH includes: paroxysmal A. 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    Patient Stated Goals  Pt would like to be able to walk further.    Currently in Pain?  No/denies    Pain Onset  More than a month ago                        [facility] Adult PT Treatment/Exercise - 01/13/20 1021      Transfers   Transfers  Sit to Stand;Stand to Sit    Sit to Stand  6: Modified independent (Device/Increase time)    Stand to Sit  6: Modified independent (Device/Increase time)      Ambulation/Gait   Ambulation/Gait  Yes    Ambulation/Gait Assistance  6: Modified independent (Device/Increase time)    Ambulation/Gait Assistance Details  Pt requiring one instance of verbal cues for improved arm swing, overall demo improved gait pattern and endurance. Pt reporting that SOB was 2/10 at end of ambulation.     Ambulation Distance (Feet)  600 Feet    Assistive device  None    Gait Pattern  Step-through pattern    Ambulation Surface  Level;Indoor  High Level Balance   High Level Balance Activities  Head turns;Marching forwards    High Level Balance Comments  Completed gait with high level balance actvities, completed horizontal head turns x 2 laps, vertical head turns x 2 laps, and marching forwards x 2 laps. Patient demo increased difficulty with hip/knee flexion with marching forwards but able to keep balance throughout. CGA for safety.       Knee/Hip Exercises: Aerobic   Nustep  Completed nustep w/ BLE/BUE on Level 3 x 7 min, with focus on kepeing steps per minute >/= 65 for improved endurance.           Balance Exercises - 01/13/20 1053      Balance Exercises: Standing   Tandem Stance  Eyes open;Eyes  closed;Intermittent upper extremity support;3 reps;30 secs;Limitations    Tandem Stance Time  completed 3 x 30 seconds with EO, 3 x 30 seconds with EC     Rockerboard  Anterior/posterior;EO;Limitations    Rockerboard Limitations  on rockerboard, completed alternating toe taps around the world x 4 cones w/ light UE support, completed alternating toe taps to 2 cones w/ No UE support and CGA for steadying x 10 reps.     Balance Beam  Standing on foam balance beam, 2 x 30 secs with EO, 3 x 30 secs w/ EC. Increased sway with EC.           PT Short Term Goals - 01/10/20 1038      PT SHORT TERM GOAL #1   Title  Pt will be independent with initial HEP for strengthening and balance.    Baseline  Pt has been performing his initial HEP as prescribed.    Time  4    Period  Weeks    Status  Achieved    Target Date  01/19/20      PT SHORT TERM GOAL #2   Title  FGA will be assessed and LTG written to further assess balance.    Baseline  FGA was 13/30    Time  4    Period  Weeks    Status  Achieved    Target Date  01/19/20      PT SHORT TERM GOAL #3   Title  Pt will decrease TUG from 26 sec to <20 sec for improved balance and functional mobility.    Baseline  26 sec on 12/20/19, 01/10/20 TUG=13.6 sec    Time  4    Period  Weeks    Status  Achieved    Target Date  01/19/20      PT SHORT TERM GOAL #4   Title  Pt will ambulate > 300' on level surfaces without AD independently for improved household mobility.    Baseline  Pt has met this goal for level surfaces.    Time  4    Period  Weeks    Status  Achieved    Target Date  01/19/20        PT Long Term Goals - 01/10/20 1049      PT LONG TERM GOAL #1   Title  Pt will be independent with progressive HEP for strength, and balance to continue gains on own.    Time  8    Period  Weeks    Status  New      PT LONG TERM GOAL #2   Title  Pt will increase gait speed form 0.32ms to >0.617m for improved gait safety.    Baseline  0.75ms on  12/20/19, 01/03/20 gait speed=.834m    Time  8    Period  Weeks    Status  Achieved      PT LONG TERM GOAL #3   Title  Pt will ambulate >500' on varied surfaces independently for improved household mobility.    Time  8    Period  Weeks    Status  New      PT LONG TERM GOAL #4   Title  Pt will increase FGA from 13 to >19/30 for improved balance and decreased fall risk.    Baseline  13/30, 01/10/20 FGA=25/30    Time  8    Period  Weeks    Status  Achieved            Plan - 01/13/20 1057    Clinical Impression Statement  Today's skilled PT session focused on continued gait training and therex for improved endurance. Also completed standing and high level balance activites. Patient continues to demo progress with therapy services regarding balance, endurance, and overall mobility.    PT Frequency  2x / week    PT Duration  8 weeks    PT Treatment/Interventions  ADLs/Self Care Home Management;Cryotherapy;Moist Heat;DME Instruction;Gait training;Stair training;Functional mobility training;Therapeutic activities;Neuromuscular re-education;Balance training;Therapeutic exercise;Patient/family education;Passive range of motion;Manual techniques;Vestibular    PT Next Visit Plan  Plan to discharge early in next 2-3 vistis as progressing faster than anticipated. Balance training (eyes closed, complaint surfaces), Continue Gait training focused on endurance and improved distances.    Consulted and Agree with Plan of Care  Patient       Patient will benefit from skilled therapeutic intervention in order to improve the following deficits and impairments:     Visit Diagnosis: Other abnormalities of gait and mobility  Muscle weakness (generalized)  Hemiplegia and hemiparesis following cerebral infarction affecting left non-dominant side ([facility]    Problem List Patient Active Problem List   Diagnosis Date Noted  . History of CVA (cerebrovascular accident) 12/27/2019  . Bilateral leg edema  12/27/2019  . Anemia 12/19/2019  . Basal ganglia stroke (HCSummerland04/23/2021  . Left-sided weakness   . Slow transit constipation   . Pain in toes of both feet   . Cerebral embolism with cerebral infarction 12/04/2019  . CVA (cerebral vascular accident) (HCOzark04/17/2021  . Acute left-sided weakness 12/03/2019  . Mixed diabetic hyperlipidemia associated with type 2 diabetes mellitus (HCStoutsville04/16/2021  . Paroxysmal atrial fibrillation (HCC)   . Secondary hypercoagulable state (HCLewis and Clark Village02/04/2020  . Atrial fibrillation with rapid ventricular response (HCBenning[facility]02/08/2019  . CAP (community acquired pneumonia) 09/20/2019  . Obesity 05/12/2015  . Uncontrolled type 2 diabetes mellitus with hypoglycemia without coma, with long-term current use of insulin (HCRichmond06/18/2009  . HLD (hyperlipidemia) 02/04/2008  . Essential hypertension 02/04/2008  . ALLERGIC RHINITIS 02/04/2008  . 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 0641/66/0630  KaJones BalesPT, DPT 01/13/2020, 11:02 AM  [place]Moj[facility] [place]ates[address]uRoeland ParkrDundasNC[place]hone: [phone] Fax:  [phone]: WiKELLI EGOLFRN: [id number] of Birth: 5/09-01-1946

## 2020-01-18 ENCOUNTER — Other Ambulatory Visit: Payer: Self-pay

## 2020-01-18 ENCOUNTER — Ambulatory Visit: Payer: Medicare Other | Attending: Internal Medicine

## 2020-01-18 DIAGNOSIS — M6281 Muscle weakness (generalized): Secondary | ICD-10-CM

## 2020-01-18 DIAGNOSIS — R2689 Other abnormalities of gait and mobility: Secondary | ICD-10-CM | POA: Diagnosis not present

## 2020-01-18 NOTE — Therapy (Signed)
[facility] [address] [place], [place], [address] Phone: [phone]   Fax:  [phone]  Physical Therapy Treatment/ Discharge Summary  Patient Details  Name: Brandon Sanchez MRN: [id number] Date of Birth: 09-09-1945 Referring Provider (PT): Reesa Chew  PHYSICAL THERAPY DISCHARGE SUMMARY  Visits from Start of Care: 9  Current functional level related to goals / functional outcomes: See clinical impression.   Remaining deficits: Pt has chronic knee pain from arthritis.   Education / Equipment: HEP  Plan: Patient agrees to discharge.  Patient goals were met. Patient is being discharged due to meeting the stated rehab goals.  ?????       Encounter Date: 01/18/2020  PT End of Session - 01/18/20 1023    Visit Number  9    Number of Visits  17    Date for PT Re-Evaluation  21/19/41   90 day cert, 60 day poc   Authorization Type  medicare so 10th visit progress note, FOTO    PT Start Time  1020    PT Stop Time  1055   finished early as d/c visit   PT Time Calculation (min)  35 min    Equipment Utilized During Treatment  Gait belt    Activity Tolerance  Patient tolerated treatment well    Behavior During Therapy  WFL for tasks assessed/performed       Past Medical History:  Diagnosis Date  . Allergic rhinitis, cause unspecified   . Asthma   . Cataract cortical, senile, bilateral   . Macular degeneration of left eye   . Other and unspecified hyperlipidemia   . Type II or unspecified type diabetes mellitus without mention of complication, not stated as uncontrolled   . Unspecified essential hypertension     Past Surgical History:  Procedure Laterality Date  . CARDIOVERSION N/A 09/22/2019   Procedure: CARDIOVERSION;  Surgeon: Donato Heinz, MD;  Location: [facility] ENDOSCOPY;  Service: Endoscopy;  Laterality: N/A;  . CARDIOVERSION N/A 10/18/2019   Procedure: CARDIOVERSION;  Surgeon: Jerline Pain,  MD;  Location: [facility] ENDOSCOPY;  Service: Cardiovascular;  Laterality: N/A;  . TEE WITHOUT CARDIOVERSION N/A 09/22/2019   Procedure: TRANSESOPHAGEAL ECHOCARDIOGRAM (TEE);  Surgeon: Donato Heinz, MD;  Location: [facility] ENDOSCOPY;  Service: Endoscopy;  Laterality: N/A;    There were no vitals filed for this visit.  Subjective Assessment - 01/18/20 1023    Subjective  Pt reports that he is doing well. Sees neurologist on Thursday.    Pertinent History  Pt is a 74 yo male presenting with slurred speech and L-sided weakness.  PMH includes: paroxysmal A. 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    Patient Stated Goals  Pt would like to be able to walk further.    Currently in Pain?  No/denies    Pain Onset  More than a month ago                        [facility] Adult PT Treatment/Exercise - 01/18/20 1025      Ambulation/Gait   Ambulation/Gait  Yes    Ambulation/Gait Assistance  7: Independent    Ambulation/Gait Assistance Details  Pt was given occasional verbal cues to increase foot clearance as scuffs left foot at times.    Ambulation Distance (Feet)  850 Feet    Assistive device  None  Gait Pattern  Step-through pattern    Ambulation Surface  Level;Unlevel;Indoor;Outdoor;Paved;Grass    Curb  7: Independent      Neuro Re-ed    Neuro Re-ed Details   Standing on pillow with feet together eyes closed 30 sec x 2 without UE support, Feet together on foam with head nods 10 x 2, tandem stance 30 sec x 2 each position. Supervision for safety as was not in corner in clinic. Advised to perform in corner at home.      Exercises   Exercises  Other Exercises    Other Exercises   Pt performed at bar: raising up on toes/back on heels 10 x 2, Side stepping without UE support 6' x 8, standing hip extension 10 x 2 bilateral with verbal cues for form.  Backwards gait with  1 UE support 6' x 4. Seated hamstring stretch x 30 sec each leg with verbal cues to hold the stretch.              PT Education - 01/18/20 1113    Education Details  Reviewed HEP. Discharge today as planned    Person(s) Educated  Patient    Methods  Explanation;Demonstration    Comprehension  Verbalized understanding;Returned demonstration       PT Short Term Goals - 01/10/20 1038      PT SHORT TERM GOAL #1   Title  Pt will be independent with initial HEP for strengthening and balance.    Baseline  Pt has been performing his initial HEP as prescribed.    Time  4    Period  Weeks    Status  Achieved    Target Date  01/19/20      PT SHORT TERM GOAL #2   Title  FGA will be assessed and LTG written to further assess balance.    Baseline  FGA was 13/30    Time  4    Period  Weeks    Status  Achieved    Target Date  01/19/20      PT SHORT TERM GOAL #3   Title  Pt will decrease TUG from 26 sec to <20 sec for improved balance and functional mobility.    Baseline  26 sec on 12/20/19, 01/10/20 TUG=13.6 sec    Time  4    Period  Weeks    Status  Achieved    Target Date  01/19/20      PT SHORT TERM GOAL #4   Title  Pt will ambulate > 300' on level surfaces without AD independently for improved household mobility.    Baseline  Pt has met this goal for level surfaces.    Time  4    Period  Weeks    Status  Achieved    Target Date  01/19/20        PT Long Term Goals - 01/18/20 1034      PT LONG TERM GOAL #1   Title  Pt will be independent with progressive HEP for strength, and balance to continue gains on own.    Time  8    Period  Weeks    Status  Achieved      PT LONG TERM GOAL #2   Title  Pt will increase gait speed form 0.66ms to >0.617m for improved gait safety.    Baseline  0.4333mon 12/20/19, 01/03/20 gait speed=.61m60m  Time  8    Period  Weeks    Status  Achieved  PT LONG TERM GOAL #3   Title  Pt will ambulate >500' on varied surfaces independently  for improved household mobility.    Baseline  850' independently    Time  8    Period  Weeks    Status  Achieved      PT LONG TERM GOAL #4   Title  Pt will increase FGA from 13 to >19/30 for improved balance and decreased fall risk.    Baseline  13/30, 01/10/20 FGA=25/30    Time  8    Period  Weeks    Status  Achieved            Plan - 01/18/20 1114    Clinical Impression Statement  Pt has progressed faster than anticipated. He feels he is doing as good as was prior to CVA and denies any increased difficulty with any tasks. They only thing he has not returned to is driving as awaiting clearance from MD. Pt has met all goals. Is ambulating without AD on varied surfaces independently. He is now in low fall risk categories having improved on TUG and FGA scores. PT discharging early and patient in agreement.    PT Frequency  2x / week    PT Duration  8 weeks    PT Treatment/Interventions  ADLs/Self Care Home Management;Cryotherapy;Moist Heat;DME Instruction;Gait training;Stair training;Functional mobility training;Therapeutic activities;Neuromuscular re-education;Balance training;Therapeutic exercise;Patient/family education;Passive range of motion;Manual techniques;Vestibular    PT Next Visit Plan  Discharge today    Consulted and Agree with Plan of Care  Patient       Patient will benefit from skilled therapeutic intervention in order to improve the following deficits and impairments:     Visit Diagnosis: Other abnormalities of gait and mobility  Muscle weakness (generalized)     Problem List Patient Active Problem List   Diagnosis Date Noted  . History of CVA (cerebrovascular accident) 12/27/2019  . Bilateral leg edema 12/27/2019  . Anemia 12/19/2019  . Basal ganglia stroke ([place]) 12/10/2019  . Left-sided weakness   . Slow transit constipation   . Pain in toes of both feet   . Cerebral embolism with cerebral infarction 12/04/2019  . CVA (cerebral vascular accident)  ([place]) 12/04/2019  . Acute left-sided weakness 12/03/2019  . Mixed diabetic hyperlipidemia associated with type 2 diabetes mellitus ([place]) 12/03/2019  . Paroxysmal atrial fibrillation (HCC)   . Secondary hypercoagulable state ([place]) 09/28/2019  . Atrial fibrillation with rapid ventricular response ([place]) 09/20/2019  . CAP (community acquired pneumonia) 09/20/2019  . Obesity 05/12/2015  . Uncontrolled type 2 diabetes mellitus with hypoglycemia without coma, with long-term current use of insulin ([place]) 02/04/2008  . HLD (hyperlipidemia) 02/04/2008  . Essential hypertension 02/04/2008  . ALLERGIC RHINITIS 02/04/2008  . 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 32/07/2481    Electa Sniff, PT, DPT, NCS 01/18/2020, 11:16 AM  [facility] [address] [place], [place], [address] Phone: [phone]   Fax:  [phone]  Name: Brandon Sanchez MRN: [id number] Date of Birth: July 28, 1946

## 2020-01-20 ENCOUNTER — Other Ambulatory Visit: Payer: Self-pay

## 2020-01-20 ENCOUNTER — Ambulatory Visit: Payer: Medicare Other

## 2020-01-20 ENCOUNTER — Ambulatory Visit (INDEPENDENT_AMBULATORY_CARE_PROVIDER_SITE_OTHER): Payer: Medicare Other | Admitting: Adult Health

## 2020-01-20 ENCOUNTER — Encounter: Payer: Self-pay | Admitting: Adult Health

## 2020-01-20 VITALS — BP 109/66 | HR 78 | Ht 75.0 in | Wt 274.0 lb

## 2020-01-20 DIAGNOSIS — I639 Cerebral infarction, unspecified: Secondary | ICD-10-CM

## 2020-01-20 DIAGNOSIS — E782 Mixed hyperlipidemia: Secondary | ICD-10-CM

## 2020-01-20 DIAGNOSIS — Z794 Long term (current) use of insulin: Secondary | ICD-10-CM

## 2020-01-20 DIAGNOSIS — E11649 Type 2 diabetes mellitus with hypoglycemia without coma: Secondary | ICD-10-CM | POA: Diagnosis not present

## 2020-01-20 DIAGNOSIS — I480 Paroxysmal atrial fibrillation: Secondary | ICD-10-CM

## 2020-01-20 DIAGNOSIS — I6381 Other cerebral infarction due to occlusion or stenosis of small artery: Secondary | ICD-10-CM

## 2020-01-20 DIAGNOSIS — I6523 Occlusion and stenosis of bilateral carotid arteries: Secondary | ICD-10-CM | POA: Diagnosis not present

## 2020-01-20 DIAGNOSIS — I10 Essential (primary) hypertension: Secondary | ICD-10-CM | POA: Diagnosis not present

## 2020-01-20 NOTE — Patient Instructions (Signed)
Continue aspirin 81 mg daily and Eliquis (apixaban) daily  and atorvastatin 80 mg daily for secondary stroke prevention  Continue to follow up with PCP regarding cholesterol, blood pressure and diabetes management as well as ongoing prescribing of atorvastatin  Continue to follow with cardiology for atrial fibrillation monitoring and Eliquis prescribing  Will plan on placing order for repeat carotid ultrasound after follow up visit  Continue to monitor blood pressure at home  Maintain strict control of hypertension with blood pressure goal below 130/90, diabetes with hemoglobin A1c goal below 6.5% and cholesterol with LDL cholesterol (bad cholesterol) goal below 70 mg/dL. I also advised the patient to eat a healthy diet with plenty of whole grains, cereals, fruits and vegetables, exercise regularly and maintain ideal body weight.  Followup in the future with me in 3 months or call earlier if needed       Thank you for coming to see [place] at [facility]. I hope we have been able to provide you high quality care today.  You may receive a patient satisfaction survey over the next few weeks. We would appreciate your feedback and comments so that we may continue to improve ourselves and the health of our patients.

## 2020-01-20 NOTE — Progress Notes (Signed)
[facility] [address] [place]. [place] [address] [phone]       HOSPITAL FOLLOW UP NOTE  [name] Date of Birth:  Feb 18, 1946 Medical Record Number:  [id number]   Reason for Referral:  hospital stroke follow up    SUBJECTIVE:   CHIEF COMPLAINT:  Chief Complaint  Patient presents with  . Follow-up    treatment rm, alone, pt states he is doing well, PT discharged him 01/18/20    HPI:   [name] is a 74 y.o. male with history of atrial fibrillation and is on Eliquis, asthma, Htn and DM  who presented on 12/03/2019 with acute onset of left hemiplegia, left facial droop and dysarthria.   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 fib could be potential etiology but he is on Eliquis and stroke location not typical for A. fib related infarct.  CTA showed atherosclerotic changes in the distal common carotid arteries at the carotid bifurcations bilaterally significant stenosis.  Continuation of Eliquis for atrial fibrillation with addition of aspirin 81 mg daily for secondary stroke prevention.  History of HTN with hypotension during admission with fluctuation of neuro symptoms and discontinue metoprolol and lowered furosemide dosage.  History of HLD on atorvastatin 40 mg daily with LDL 52 and increase atorvastatin dosage to 80 mg daily.  DM with A1c 7.6.  Other stroke risk factors include advanced age, prior EtOH use and obesity but no prior stroke history.  Residual deficits of mild left facial droop and left hemiparesis.  Evaluated by therapies who recommended discharge to CIR for functional decline.  Stroke: right BG/CR small scattered infarcts, likely due to right CCA mixed plaque. Although afib could be potential etiology, but he is on eliquis and stroke location not typical for afib related infarct.   CT Head - No acute intracranial abnormality.   CTA H&N - No emergent  large vessel occlusion. Atherosclerotic changes in the distal common carotid arteries and at the carotid bifurcations bilaterally significant stenosis.   MRI head right BG/CR small infarcts  MRA head - normal  2D Echo EF 55-60%  [employer] Virus 2 - negative  LDL - 52  HgbA1c - 7.6  VTE prophylaxis - Eliquis  Eliquis (apixaban) daily prior to admission, now on aspirin 81 mg daily and Eliquis (apixaban) daily. Continue on discharge  Patient counseled to be compliant with his antithrombotic medications  Ongoing aggressive stroke risk factor management  Therapy recommendations: CIR  Disposition:  CIR  Today, 01/20/2020, [name] is being seen for hospital follow-up.  He is accompanied by his daughter via telephone during visit.  He was discharged from CIR to home with outpatient therapies on 12/17/2019.  He has recovered well from a stroke standpoint with complete resolution and return to baseline.  Discharged from outpatient PT on 01/18/2020 due to meeting all goals.  He has continued on aspirin 81 mg daily and Eliquis without bleeding or bruising.  Continues on atorvastatin 80 mg daily without myalgias.  Blood pressure today 109/66 -monitors at home routinely and currently has been ranging 110-120/70-80s.  He was restarted on Lasix due to lower extremity edema with blood pressure ranging 130-150 prior to restarting.  Glucose levels stable and continues to follow with endocrinology for monitoring and management.  Continues to follow closely with PCP, endocrinology and cardiology. He did undergo sleep study prior to his stroke on 11/24/2019 which was negative for sleep apnea.  No concerns at this time.    ROS:   14 system review of systems performed and negative with exception of no complaints  PMH:  Past Medical History:  Diagnosis Date  . Allergic rhinitis, cause unspecified   . Asthma   . Cataract cortical, senile, bilateral   . Macular degeneration of left eye   . Other and  unspecified hyperlipidemia   . Type II or unspecified type diabetes mellitus without mention of complication, not stated as uncontrolled   . Unspecified essential hypertension     PSH:  Past Surgical History:  Procedure Laterality Date  . CARDIOVERSION N/A 09/22/2019   Procedure: CARDIOVERSION;  Surgeon: [name], MD;  Location: [facility] ENDOSCOPY;  Service: Endoscopy;  Laterality: N/A;  . CARDIOVERSION N/A 10/18/2019   Procedure: CARDIOVERSION;  Surgeon: Jake Bathe, MD;  Location: [facility] ENDOSCOPY;  Service: Cardiovascular;  Laterality: N/A;  . TEE WITHOUT CARDIOVERSION N/A 09/22/2019   Procedure: TRANSESOPHAGEAL ECHOCARDIOGRAM (TEE);  Surgeon: [name], MD;  Location: [facility] ENDOSCOPY;  Service: Endoscopy;  Laterality: N/A;    Social History:  Social History   Socioeconomic History  . Marital status: Divorced    Spouse name: Not on file  . Number of children: Not on file  . Years of education: Not on file  . Highest education level: Professional school degree (e.g., MD, DDS, DVM, JD)  Occupational History  . Occupation: Armed forces logistics/support/administrative officer  Tobacco Use  . Smoking status: Never Smoker  . Smokeless tobacco: Never Used  Substance and Sexual Activity  . Alcohol use: Not Currently    Comment: Used to drink 2 L bottle of wine/day. Quit 09/18/19  . Drug use: Never  . Sexual activity: Not Currently  Other Topics Concern  . Not on file  Social History Narrative  . Not on file   Social Determinants of Health   Financial Resource Strain:   . Difficulty of Paying Living Expenses:   Food Insecurity: No Food Insecurity  . Worried About Programme researcher, broadcasting/film/video in the Last Year: Never true  . Ran Out of Food in the Last Year: Never true  Transportation Needs: No Transportation Needs  . Lack of Transportation (Medical): No  . Lack of Transportation (Non-Medical): No  Physical Activity:   . Days of Exercise per Week:   . Minutes of Exercise per Session:   Stress:   .  Feeling of Stress :   Social Connections:   . Frequency of Communication with Friends and Family:   . Frequency of Social Gatherings with Friends and Family:   . Attends Religious Services:   . Active Member of Clubs or Organizations:   . Attends Banker Meetings:   Marland Kitchen Marital Status:   Intimate Partner Violence:   . Fear of Current or Ex-Partner:   . Emotionally Abused:   Marland Kitchen Physically Abused:   . Sexually Abused:     Family History:  Family History  Problem Relation Age of Onset  . Colon cancer Father   . Heart disease Paternal Grandfather     Medications:   Current Outpatient Medications on File Prior to Visit  Medication Sig Dispense Refill  . albuterol (PROAIR HFA) 108 (90 BASE) MCG/ACT inhaler 2 puffs every 4 hours as needed only  if your can't catch your breath (Patient taking differently: Inhale 2 puffs into the lungs every 4 (four) hours as needed for wheezing. ) 1 Inhaler 1  . amiodarone (PACERONE) 200 MG tablet Take 1 tablet (200  mg total) by mouth daily. 90 tablet 2  . apixaban (ELIQUIS) 5 MG TABS tablet Take 1 tablet (5 mg total) by mouth 2 (two) times daily. 60 tablet 6  . aspirin EC 81 MG EC tablet Take 1 tablet (81 mg total) by mouth daily. 60 tablet 0  . atorvastatin (LIPITOR) 80 MG tablet Take 1 tablet (80 mg total) by mouth every evening. 30 tablet 0  . BREO ELLIPTA 200-25 MCG/INH AEPB Inhale 1 puff into the lungs daily as needed (shortness of breath).     . furosemide (LASIX) 40 MG tablet Take 40 mg by mouth. Pt was instructed to take 2x/day for a week and then down to 1x/day.    . insulin aspart protamine- aspart (NOVOLOG MIX 70/30) (70-30) 100 UNIT/ML injection Inject 0.16 mLs (16 Units total) into the skin 2 (two) times daily at 8 am and 10 pm. Dose varies - Prescription is for 20 units with each meal (twice daily). However administration depends on what types of meals being taken. 10 mL 0  . iron polysaccharides (NIFEREX) 150 MG capsule Take 1  capsule (150 mg total) by mouth daily. 30 capsule 1  . Multiple Vitamin (MULTIVITAMIN WITH MINERALS) TABS tablet Take 1 tablet by mouth daily.    Marland Kitchen NOVOLOG MIX 70/30 FLEXPEN (70-30) 100 UNIT/ML FlexPen     . omeprazole (PRILOSEC) 40 MG capsule Take 40 mg by mouth daily as needed (heart burn).     Glory Rosebush VERIO test strip 1 each by Other route in the morning, at noon, and at bedtime.     . polyethylene glycol (MIRALAX / GLYCOLAX) 17 g packet Take 17 g by mouth daily. 14 each 0  . PREVIDENT 5000 BOOSTER PLUS 1.1 % PSTE Place 1 application onto teeth in the morning and at bedtime.     . senna-docusate (SENOKOT-S) 8.6-50 MG tablet Take 2 tablets by mouth 2 (two) times daily. 120 tablet 0  . sertraline (ZOLOFT) 100 MG tablet Take 100 mg by mouth daily.    Marland Kitchen MYRBETRIQ 25 MG TB24 tablet Take 25 mg by mouth daily.     No current facility-administered medications on file prior to visit.    Allergies:  No Known Allergies    OBJECTIVE:  Physical Exam  Vitals:   01/20/20 0906  BP: 109/66  Pulse: 78  Weight: 274 lb (124.3 kg)  Height: 6\' 3"  (1.905 m)   Body mass index is 34.25 kg/m. No exam data present  Post stroke PHQ 2-9 Depression screen [facility] 2/9 01/20/2020  Decreased Interest 0  Down, Depressed, Hopeless 0  PHQ - 2 Score 0  Altered sleeping -  Tired, decreased energy -  Change in appetite -  Feeling bad or failure about yourself  -  Trouble concentrating -  Moving slowly or fidgety/restless -  Suicidal thoughts -  PHQ-9 Score -     General: well developed, well nourished,  pleasant elderly Caucasian male, seated, in no evident distress Head: head normocephalic and atraumatic.   Neck: supple with no carotid or supraclavicular bruits Cardiovascular: regular rate and rhythm, no murmurs Musculoskeletal: no deformity Skin:  no rash/petichiae Vascular:  Normal pulses all extremities; 2+ edema BLE L>R (chronic)   Neurologic Exam Mental Status: Awake and fully alert.   Fluent  speech and language.  Oriented to place and time. Recent and remote memory intact. Attention span, concentration and fund of knowledge appropriate. Mood and affect appropriate.  Cranial Nerves: Fundoscopic exam reveals sharp disc margins. Pupils  equal, briskly reactive to light. Extraocular movements full without nystagmus. Visual fields full to confrontation. Hearing intact. Facial sensation intact. Face, tongue, palate moves normally and symmetrically.  Motor: Normal bulk and tone. Normal strength in all tested extremity muscles. Sensory.: intact to touch , pinprick , position and vibratory sensation.  Coordination: Rapid alternating movements normal in all extremities. Finger-to-nose and heel-to-shin performed accurately bilaterally. Gait and Station: Arises from chair without difficulty. Stance is normal. Gait demonstrates normal stride length and balance without use of assistive device. Reflexes: 1+ and symmetric. Toes downgoing.     NIHSS  0 Modified Rankin  0 CHA2DS2-VASc Score = 6  (?2 oral anticoagulation recommended) Age in Years: 2-74 +1  Sex: Male 0  Hypertension History:  Yes +1  Diabetes Mellitus:  Yes +1 Congestive Heart Failure History:  No 0             Vascular Disease History (prior MI, aortic arthrosclerosis, PAD):  Yes +1                           Stroke/TIA/Thromboembolism History:  Yes +2 HAS-BLED 3     ASSESSMENT: RUSHI CHASEN is a 74 y.o. year old male presented with acute onset of ophthalmoplegia, left facial droop and dysarthria on 12/03/2019 with stroke work-up revealing right BG/CR small scattered infarcts likely secondary to right CCA mixed plaque.  Noted that A. fib could be potential etiology but he is on Eliquis and stroke formation notable for A. fib related infarct.  Recommended adding aspirin in addition to Eliquis at discharge.  Vascular risk factors include PAF on Eliquis, CCA stenosis, HTN, HLD and DM.  Recently underwent sleep study which was  negative for sleep apnea.  Recovered well from a stroke standpoint without residual deficits.     PLAN:  1. Right BG/CR stroke:  -No residual deficits -Continue aspirin 81 mg daily and Eliquis (apixaban) daily  and atorvastatin 80 mg daily for secondary stroke prevention.  -Maintain strict control of hypertension with blood pressure goal below 130/90, diabetes with hemoglobin A1c goal below 6.5% and cholesterol with LDL cholesterol (bad cholesterol) goal below 70 mg/dL.  I also advised the patient to eat a healthy diet with plenty of whole grains, cereals, fruits and vegetables, exercise regularly with at least 30 minutes of continuous activity daily and maintain ideal body weight. 2. Atrial fibrillation: Continuation of Eliquis and ongoing follow-up with cardiology for monitoring and management 3. Carotid artery disease: Continuation of aspirin and increased dose of statin along with importance of managing HTN, HLD and DM.  Also discussed importance of avoiding hypotension.  Will obtain carotid Doppler 6 months post stroke around 05/2020 for surveillance monitoring 4. HTN: Stable.  Continue to follow with PCP for monitoring management 5. HLD: Continuation of atorvastatin and continue to follow with PCP for prescribing, monitoring and management 6. DMII: Controlled.  Continue to follow with PCP for monitoring and management    Follow up in 3 months or call earlier if needed   I spent 45 minutes of face-to-face and non-face-to-face time with patient and daughter via telephone.  This included previsit chart review, lab review, study review, order entry, electronic health record documentation, patient education regarding recent stroke, importance of managing stroke risk factors and answered all questions to patient satisfaction     Brandon Sanchez, [facility]  [facility] [address] [place], [place] [address]  Phone [phone] Fax [phone] Note:  This  document was prepared with digital dictation and possible smart phrase technology. Any transcriptional errors that result from this process are unintentional.

## 2020-01-21 NOTE — Progress Notes (Signed)
I agree with the above plan 

## 2020-01-24 ENCOUNTER — Telehealth: Payer: Self-pay | Admitting: Cardiology

## 2020-01-24 NOTE — Telephone Encounter (Signed)
New message   Patient's daughter states that he is now ready to schedule ablation. Please call to discuss.

## 2020-01-24 NOTE — Telephone Encounter (Signed)
Advised patient that Dr. Elberta Fortis and his nurse Roanna Raider were not in the office today but I would forward them the message and Roanna Raider will be in touch with him soon.

## 2020-01-25 ENCOUNTER — Ambulatory Visit: Payer: Federal, State, Local not specified - PPO

## 2020-01-25 ENCOUNTER — Other Ambulatory Visit: Payer: Self-pay | Admitting: *Deleted

## 2020-01-25 NOTE — Patient Outreach (Signed)
[facility]) Care Management  01/25/2020  Brandon Sanchez 04/09/1946 [id number]   Call placed to member to follow up on stroke recovery, no answer, unable to leave voice message.  Will follow up within the next 3-4 business days.  Kemper Durie, [place], MSN [facility] Care Management  [facility] Manager [phone]

## 2020-01-25 NOTE — Telephone Encounter (Signed)
dpr on file. Scheduled with dtr. Scheduled OV w/ Camnitz on 7/1 to further discuss ablation. 7/30 date held for procedure. Dtr agreeable to plan.

## 2020-01-27 ENCOUNTER — Ambulatory Visit: Payer: Federal, State, Local not specified - PPO

## 2020-01-31 ENCOUNTER — Other Ambulatory Visit: Payer: Self-pay | Admitting: *Deleted

## 2020-01-31 NOTE — Patient Outreach (Signed)
[facility]) Care Management  01/31/2020  Brandon Sanchez [birth year] [id number]   Outreach attempt #2, successful.  Call placed to member to follow up on stroke recovery.  He report he is doing well, completed his therapy course with outpatient rehab earlier than expected due to his speedy progress.  He has now been released not only from therapy sessions but he is able to drive too.  Was seen by neurology on 6/3, no ongoing issues, will follow up on 9/9.  He was last seen by cardiology on 5/10, next visit scheduled for 8/13.  He will see the MD in preparation for his ablation on 7/1, scheduled for procedure on 7/30.  Has continued to monitor blood pressure, HR, and blood sugar daily, report all have been stable, no complications.  Denies any urgent concerns at this time, encouraged to contact this care manager with questions.  Will follow up within the next month.  [facility] CM Care Plan Problem One     Most Recent Value  Care Plan Problem One Risk for hospitalization related to A-fib as evidenced by recent admission  Role Documenting the Problem One Care Management [place] for Problem One Active  [facility] Long Term Goal  Member will not be readmitted to hospital with stroke related complications within the next 31 days  [facility] Long Term Goal Start Date 12/29/19  [New admission]  [facility] Long Term Goal Met Date 01/31/20  [facility] CM Short Term Goal #1  Member will report checking blood pressure and heart rate daily over the next 4 weeks  [facility] CM Short Term Goal #1 Start Date 12/29/19  [Date reset]  [facility] CM Short Term Goal #1 Met Date 01/31/20  [facility] CM Short Term Goal #3 Member will report attending follow up within neurology within the next 4 weeks  [facility] CM Short Term Goal #3 Start Date 12/29/19  [facility] CM Short Term Goal #3 Met Date 01/31/20     Valente David, RN, MSN [facility] [phone]

## 2020-02-01 ENCOUNTER — Ambulatory Visit: Payer: Federal, State, Local not specified - PPO

## 2020-02-03 ENCOUNTER — Ambulatory Visit: Payer: Federal, State, Local not specified - PPO

## 2020-02-08 ENCOUNTER — Ambulatory Visit: Payer: Federal, State, Local not specified - PPO

## 2020-02-09 MED ORDER — FUROSEMIDE 40 MG PO TABS: 40.0000 mg | ORAL_TABLET | Freq: Every day | ORAL | 3 refills | Status: DC

## 2020-02-10 ENCOUNTER — Other Ambulatory Visit: Payer: Self-pay | Admitting: Physical Medicine and Rehabilitation

## 2020-02-10 ENCOUNTER — Ambulatory Visit: Payer: Federal, State, Local not specified - PPO

## 2020-02-11 ENCOUNTER — Encounter: Payer: Medicare Other | Attending: Registered Nurse | Admitting: Physical Medicine & Rehabilitation

## 2020-02-11 ENCOUNTER — Encounter: Payer: Self-pay | Admitting: Physical Medicine & Rehabilitation

## 2020-02-11 ENCOUNTER — Other Ambulatory Visit: Payer: Self-pay

## 2020-02-11 VITALS — BP 130/74 | HR 70 | Temp 97.3°F | Ht 75.0 in | Wt 280.0 lb

## 2020-02-11 DIAGNOSIS — E11649 Type 2 diabetes mellitus with hypoglycemia without coma: Secondary | ICD-10-CM | POA: Insufficient documentation

## 2020-02-11 DIAGNOSIS — R531 Weakness: Secondary | ICD-10-CM | POA: Diagnosis present

## 2020-02-11 DIAGNOSIS — Z794 Long term (current) use of insulin: Secondary | ICD-10-CM | POA: Diagnosis present

## 2020-02-11 DIAGNOSIS — R600 Localized edema: Secondary | ICD-10-CM | POA: Diagnosis present

## 2020-02-11 DIAGNOSIS — I6523 Occlusion and stenosis of bilateral carotid arteries: Secondary | ICD-10-CM

## 2020-02-11 DIAGNOSIS — Z8673 Personal history of transient ischemic attack (TIA), and cerebral infarction without residual deficits: Secondary | ICD-10-CM

## 2020-02-11 NOTE — Progress Notes (Signed)
Subjective:    Patient ID: Brandon Sanchez, male    DOB: 05-11-1946, 74 y.o.   MRN: [id number]  74 y.o. Rh male with history of T2DM, recent diagnosis of A. 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  CTA head/neck was negative for LVO, showed atherosclerotic changes distal CCA and carotid bifurcation and 50% stenosis of L-VA.Marland Kitchen  MRI brain done revealing small right brain infarct.  2D echo showed EF of 55 to 60% with moderate aortic valve stenosis and mild concentric LVH with grade 3 DD.  Dr. Roda Shutters  felt the stroke was due to large vessel disease likely right CCA mixed plaque and low-dose ASA was added to Eliquis for stroke prevention. Lipitor was increased to 80 mg a day.  Patient had fluctuating left he continued to have functional decline-sided symptoms due to hypertension therefore metoprolol and Lasix were discontinued.  Patient with resultant left-sided weakness affecting ADLs and mobility.  CIR was recommended due to functional decline.  Admit date: 12/10/2019 Discharge date: 12/17/2019  HPI Mod I ADLs, No use of AD for mobility  No falls Back to driving  Chronic bilateral knee pain limits pt more than anything  Finished outpt therapy  Has seen Neuro NP Has seen PCP PA has return visit next week Had pre existing tremor   Uses stationary bike for exercise, also has [employer] membership Pain Inventory Average Pain 2 Pain Right Now 1 My pain is sharp and dull  In the last 24 hours, has pain interfered with the following? General activity 0 Relation with others 0 Enjoyment of life 0 What TIME of day is your pain at its worst? morning Sleep (in general) Good  Pain is worse with: walking and bending Pain improves with: therapy/exercise Relief from Meds: none  Mobility walk without assistance how many minutes can you walk? 20-30 ability to climb steps?  yes do you drive?   yes  Function retired  Neuro/Psych bladder control problems tremor  Prior Studies Any changes since last visit?  no  Physicians involved in your care Any changes since last visit?  no   Family History  Problem Relation Age of Onset  . Colon cancer Father   . Heart disease Paternal Grandfather    Social History   Socioeconomic History  . Marital status: Divorced    Spouse name: Not on file  . Number of children: Not on file  . Years of education: Not on file  . Highest education level: Professional school degree (e.g., MD, DDS, DVM, JD)  Occupational History  . Occupation: Armed forces logistics/support/administrative officer  Tobacco Use  . Smoking status: Never Smoker  . Smokeless tobacco: Never Used  Substance and Sexual Activity  . Alcohol use: Not Currently    Comment: Used to drink 2 L bottle of wine/day. Quit 09/18/19  . Drug use: Never  . Sexual activity: Not Currently  Other Topics Concern  . Not on file  Social History Narrative  . Not on file   Social Determinants of Health   Financial Resource Strain:   . Difficulty of Paying Living Expenses:   Food Insecurity: No Food Insecurity  . Worried About Programme researcher, broadcasting/film/video in the Last Year: Never true  . Ran Out of Food in the Last Year: Never true  Transportation Needs: No Transportation Needs  . Lack of Transportation (Medical): No  . Lack of Transportation (Non-Medical): No  Physical Activity:   . Days of  Exercise per Week:   . Minutes of Exercise per Session:   Stress:   . Feeling of Stress :   Social Connections:   . Frequency of Communication with Friends and Family:   . Frequency of Social Gatherings with Friends and Family:   . Attends Religious Services:   . Active Member of Clubs or Organizations:   . Attends Archivist Meetings:   Marland Kitchen Marital Status:    Past Surgical History:  Procedure Laterality Date  . CARDIOVERSION N/A 09/22/2019   Procedure: CARDIOVERSION;  Surgeon: Donato Heinz, MD;  Location: [facility]  ENDOSCOPY;  Service: Endoscopy;  Laterality: N/A;  . CARDIOVERSION N/A 10/18/2019   Procedure: CARDIOVERSION;  Surgeon: Jerline Pain, MD;  Location: [facility] ENDOSCOPY;  Service: Cardiovascular;  Laterality: N/A;  . TEE WITHOUT CARDIOVERSION N/A 09/22/2019   Procedure: TRANSESOPHAGEAL ECHOCARDIOGRAM (TEE);  Surgeon: Donato Heinz, MD;  Location: [facility] ENDOSCOPY;  Service: Endoscopy;  Laterality: N/A;   Past Medical History:  Diagnosis Date  . Allergic rhinitis, cause unspecified   . Asthma   . Cataract cortical, senile, bilateral   . Macular degeneration of left eye   . Other and unspecified hyperlipidemia   . Type II or unspecified type diabetes mellitus without mention of complication, not stated as uncontrolled   . Unspecified essential hypertension    BP 130/74   Pulse 70   Temp (!) 97.3 F (36.3 C)   Ht 6\' 3"  (1.905 m)   Wt 280 lb (127 kg)   SpO2 97%   BMI 35.00 kg/m   Opioid Risk Score:   Fall Risk Score:  `1  Depression screen PHQ 2/9  Depression screen [facility] 2/9 01/20/2020 01/04/2020 09/27/2019  Decreased Interest 0 0 0  Down, Depressed, Hopeless 0 0 0  PHQ - 2 Score 0 0 0  Altered sleeping - 0 -  Tired, decreased energy - 0 -  Change in appetite - 0 -  Feeling bad or failure about yourself  - 0 -  Trouble concentrating - 0 -  Moving slowly or fidgety/restless - 0 -  Suicidal thoughts - 0 -  PHQ-9 Score - 0 -    Review of Systems  Constitutional: Negative.   HENT: Negative.   Eyes: Negative.   Cardiovascular: Positive for leg swelling.  Gastrointestinal: Negative.   Endocrine:       Low blood sugar  Genitourinary: Negative.   Musculoskeletal: Negative.   Skin: Negative.   Allergic/Immunologic: Negative.   Neurological: Positive for tremors.  Hematological: Negative.   Psychiatric/Behavioral: Negative.   All other systems reviewed and are negative.      Objective:   Physical Exam Vitals and nursing note reviewed.  Constitutional:      Appearance: He  is obese.  HENT:     Head: Normocephalic and atraumatic.  Eyes:     General: No visual field deficit. Skin:    General: Skin is warm and dry.  Neurological:     Mental Status: He is alert and oriented to person, place, and time.     Cranial Nerves: No dysarthria or facial asymmetry.     Motor: Tremor present. No atrophy or abnormal muscle tone.     Coordination: Coordination is intact. Romberg sign negative. Finger-Nose-Finger Test normal. Rapid alternating movements normal.     Gait: Tandem walk abnormal.     Comments: Motor strength is 5/5 bilateral deltoid bicep tricep grip hip flexor knee extensor ankle dorsiflexor Sensation intact light touch bilateral upper and  lower limbs Tremor left-sided greater right-sided upper extremities mainly with intention No evidence of cogwheeling           Assessment & Plan:  1.  History of right carotid artery distribution infarct with essential resolution of symptoms.  Patient is now approximately 2 months post stroke he has made an excellent functional recovery.  He has no observable neurologic deficits other than problems with tandem gait which the patient attributes to his knee arthritis. Risk factors, atrial fibrillation and hyperlipidemia, he will follow-up with neurology as well as primary care on this. He is also scheduled for a cardiac ablation procedure with Dr. Elberta Fortis, probably will need to be off Eliquis for the procedure, will need neuro input on this  Patient is at a modified independent level and driving he is back to his prior functional status.  He will follow-up with physical medicine rehab on a as needed basis

## 2020-02-11 NOTE — Patient Instructions (Addendum)
Follow up with Neurology, primary care , and cardiology  Rec resume exercise stationary bike or aquatic as needed

## 2020-02-17 ENCOUNTER — Encounter: Payer: Self-pay | Admitting: Cardiology

## 2020-02-17 ENCOUNTER — Other Ambulatory Visit: Payer: Self-pay

## 2020-02-17 ENCOUNTER — Ambulatory Visit (INDEPENDENT_AMBULATORY_CARE_PROVIDER_SITE_OTHER): Payer: Medicare Other | Admitting: Cardiology

## 2020-02-17 DIAGNOSIS — I6523 Occlusion and stenosis of bilateral carotid arteries: Secondary | ICD-10-CM

## 2020-02-17 DIAGNOSIS — I4891 Unspecified atrial fibrillation: Secondary | ICD-10-CM

## 2020-02-17 NOTE — Patient Instructions (Addendum)
Medication Instructions:  Your physician recommends that you continue on your current medications as directed. Please refer to the Current Medication list given to you today.  *If you need a refill on your cardiac medications before your next appointment, please call your pharmacy*   Lab Work: None ordered If you have labs (blood work) drawn today and your tests are completely normal, you will receive your results only by: Marland Kitchen MyChart Message (if you have MyChart) OR . A paper copy in the mail If you have any lab test that is abnormal or we need to change your treatment, we will call you to review the results.   Testing/Procedures: Your physician has requested that you have cardiac CT within 7 days PRIOR to your ablation. Cardiac computed tomography (CT) is a painless test that uses an x-ray machine to take clear, detailed pictures of your heart. Please see the instructions below located under "other instructions".   Your physician has recommended that you have an ablation. Catheter ablation is a medical procedure used to treat some cardiac arrhythmias (irregular heartbeats). During catheter ablation, a long, thin, flexible tube is put into a blood vessel in your groin (upper thigh), or neck. This tube is called an ablation catheter. It is then guided to your heart through the blood vessel. Radio frequency waves destroy small areas of heart tissue where abnormal heartbeats may cause an arrhythmia to start. Please see the instructions below located under "other instructions".    Follow-Up: At [facility], you and your health needs are our priority.  As part of our continuing mission to provide you with exceptional heart care, we have created designated Provider Care Teams.  These Care Teams include your primary Cardiologist (physician) and Advanced Practice Providers (APPs -  Physician Assistants and Nurse Practitioners) who all work together to provide you with the care you need, when you need  it.  We recommend signing up for the patient portal called "MyChart".  Sign up information is provided on this After Visit Summary.  MyChart is used to connect with patients for Virtual Visits (Telemedicine).  Patients are able to view lab/test results, encounter notes, upcoming appointments, etc.  Non-urgent messages can be sent to your provider as well.   To learn more about what you can do with MyChart, go to [email or web address].    Your next appointment:   1 month(s)  The format for your next appointment:   In Person  Provider:   Allegra Lai, MD   Thank you for choosing [place]!!   Trinidad Curet, RN [phone]    Other Instructions  CT INSTRUCTIONS  Your cardiac CT will be scheduled at:  [facility] [address] [place], [place] [address] [phone]  Please arrive at the [facility] main entrance of [facility] on _____________ @ ______________, please arrive 30 minutes prior to test start time. Proceed to the [facility] Radiology Department (first floor) to check-in and test prep.  Please follow these instructions carefully (unless otherwise directed):  Hold all erectile dysfunction medications at least 3 days (72 hrs) prior to test.  On the Night Before the Test: . Be sure to Drink plenty of water. . Do not consume any caffeinated/decaffeinated beverages or chocolate 12 hours prior to your test. . Do not take any antihistamines 12 hours prior to your test.  On the Day of the Test: . Drink plenty of water. Do not drink any water within one hour of the test. . Do  not eat any food 4 hours prior to the test. . You may take your regular medications prior to the test.  . Take metoprolol (Lopressor) 100 mg two hours prior to test. . HOLD Furosemide/Hydrochlorothiazide morning of the test. .  After the Test: . Drink plenty of water. . After receiving IV contrast, you may experience a mild flushed feeling. This is  normal. . On occasion, you may experience a mild rash up to 24 hours after the test. This is not dangerous. If this occurs, you can take Benadryl 25 mg and increase your fluid intake. . If you experience trouble breathing, this can be serious. If it is severe call 911 IMMEDIATELY. If it is mild, please call our office. . If you take any of these medications: Glipizide/Metformin, Avandament, Glucavance, please do not take 48 hours after completing test unless otherwise instructed.  Once we have confirmed authorization from your insurance company, we will call you to set up a date and time for your test. Based on how quickly your insurance processes prior authorizations requests, please allow up to 4 weeks to be contacted for scheduling your Cardiac CT appointment. Be advised that routine Cardiac CT appointments could be scheduled as many as 8 weeks after your provider has ordered it.  For non-scheduling related questions, please contact the cardiac imaging nurse navigator should you have any questions/concerns: Marchia Bond, Cardiac Imaging Nurse Navigator Burley Saver, Interim Cardiac Imaging Nurse Bee and Vascular Services Direct Office Dial: [phone]   For scheduling needs, including cancellations and rescheduling, please call Vivien Rota at [phone], option 3.      Electrophysiology/Ablation Procedure Instructions   You are scheduled for a(n)  ablation on 03/15/2020 with Dr. Allegra Lai.   1.   Pre procedure testing-             A.  LAB WORK --- On ______________  for your pre procedure blood work.     2. On the day of your procedure 03/15/2020 you will go to [facility] [phone] [address]) at 9:30 am.  Dennis Bast will go to the main entrance A [employer]) and enter where the [employer] are.  Your driver will drop you off and you will head down the hallway to ADMITTING.  You may have one support person come in to the hospital with you.  They will be asked to  wait in the waiting room.   3.   Do not eat or drink after midnight prior to your procedure.   4.   Do NOT take any medications the morning of your procedure.   5.  Plan for an overnight stay.  If you use your phone frequently bring your phone charger.   6. You will follow up with the AFIB clinic 4 weeks after your procedure.  You will follow up with Dr. Curt Bears  3 months after your procedure.  These appointments will be made for you.   * If you have ANY questions please call the office [phone] and ask for Tanay Misuraca RN or send me a MyChart message   * Occasionally, EP Studies and ablations can become lengthy.  Please make your family aware of this before your procedure starts.  Average time ranges from 2-8 hours for EP studies/ablations.  Your physician will call your family after the procedure with the results.

## 2020-02-17 NOTE — H&P (View-Only) (Signed)
 Electrophysiology Office Note   Date:  02/17/2020   ID:  Brandon Sanchez, DOB 11/04/1945, MRN [id number]  PCP:  Pharr, Walter, MD  Cardiologist:  Brandon Sanchez:  Brandon Chimento Martin Kathryn Linarez, MD    Chief Complaint: AF   History of Present Illness: Brandon Sanchez is a 74 y.o. male who is being seen today for the evaluation of AF at the request of Pharr, Walter, MD. Presenting today for electrophysiology evaluation.  He has a history significant for asthma, type 2 diabetes, hypertension, hyperlipidemia.  He has a recent diagnosis of atrial fibrillation.  He presented to the hospital 09/19/2019.  He was admitted with atrial fibrillation and rapid rates.  He underwent TEE and cardioversion.  He unfortunately went back into atrial fibrillation.  He was loaded on amiodarone and is now status post cardioversion 10/18/2019.  Today, denies symptoms of palpitations, chest pain, shortness of breath, orthopnea, PND, lower extremity edema, claudication, dizziness, presyncope, syncope, bleeding, or neurologic sequela. The patient is tolerating medications without difficulties. Since last being seen, he has had no further episodes of atrial fibrillation. He has been compliant with his medications. He is felt well and is without complaints today.   Past Medical History:  Diagnosis Date  . Allergic rhinitis, cause unspecified   . Asthma   . Cataract cortical, senile, bilateral   . Macular degeneration of left eye   . Other and unspecified hyperlipidemia   . Type II or unspecified type diabetes mellitus without mention of complication, not stated as uncontrolled   . Unspecified essential hypertension    Past Surgical History:  Procedure Laterality Date  . CARDIOVERSION N/A 09/22/2019   Procedure: CARDIOVERSION;  Surgeon: Schumann, Brandon L, MD;  Location: [facility]COPY;  Service: Endoscopy;  Laterality: N/A;  . CARDIOVERSION N/A 10/18/2019   Procedure: CARDIOVERSION;  Surgeon:  Brandon Sanchez, Mark C, MD;  Location: [facility] ENDOSCOPY;  Service: Cardiovascular;  Laterality: N/A;  . TEE WITHOUT CARDIOVERSION N/A 09/22/2019   Procedure: TRANSESOPHAGEAL ECHOCARDIOGRAM (TEE);  Surgeon: Schumann, Brandon L, MD;  Location: [facility] ENDOSCOPY;  Service: Endoscopy;  Laterality: N/A;     Current Outpatient Medications  Medication Sig Dispense Refill  . albuterol (PROAIR HFA) 108 (90 BASE) MCG/ACT inhaler 2 puffs every 4 hours as needed only  if your can't catch your breath (Patient taking differently: Inhale 2 puffs into the lungs every 4 (four) hours as needed for wheezing. ) 1 Inhaler 1  . amiodarone (PACERONE) 200 MG tablet Take 1 tablet (200 mg total) by mouth daily. 90 tablet 2  . apixaban (ELIQUIS) 5 MG TABS tablet Take 1 tablet (5 mg total) by mouth 2 (two) times daily. 60 tablet 6  . aspirin EC 81 MG EC tablet Take 1 tablet (81 mg total) by mouth daily. 60 tablet 0  . atorvastatin (LIPITOR) 80 MG tablet Take 1 tablet (80 mg total) by mouth every evening. 30 tablet 0  . BREO ELLIPTA 200-25 MCG/INH AEPB Inhale 1 puff into the lungs daily as needed (shortness of breath).     . furosemide (LASIX) 40 MG tablet Take 1 tablet (40 mg total) by mouth daily. 90 tablet 3  . insulin aspart protamine- aspart (NOVOLOG MIX 70/30) (70-30) 100 UNIT/ML injection Inject 0.16 mLs (16 Units total) into the skin 2 (two) times daily at 8 am and 10 pm. Dose varies - Prescription is for 20 units with each meal (twice daily). However administration depends on what types of meals being taken. 10 mL 0  .   Multiple Vitamin (MULTIVITAMIN WITH MINERALS) TABS tablet Take 1 tablet by mouth daily.    Marland Kitchen MYRBETRIQ 25 MG TB24 tablet Take 25 mg by mouth daily.    Marland Kitchen omeprazole (PRILOSEC) 40 MG capsule Take 40 mg by mouth daily as needed (heart burn).     Letta Pate VERIO test strip 1 each by Other route in the morning, at noon, and at bedtime.     . polyethylene glycol (MIRALAX / GLYCOLAX) 17 g packet Take 17 g by mouth daily.  14 each 0  . PREVIDENT 5000 BOOSTER PLUS 1.1 % PSTE Place 1 application onto teeth in the morning and at bedtime.     . sertraline (ZOLOFT) 100 MG tablet Take 100 mg by mouth daily.     No current facility-administered medications for this visit.    Allergies:   Patient has no known allergies.   Social History:  The patient  reports that he has never smoked. He has never used smokeless tobacco. He reports previous alcohol use. He reports that he does not use drugs.   Family History:  The patient's family history includes Colon cancer in his father; Heart disease in his paternal grandfather.   ROS:  Please see the history of present illness.   Otherwise, review of systems is positive for none.   All other systems are reviewed and negative.   PHYSICAL EXAM: VS:  BP 118/66   Pulse 69   Ht 6\' 3"  (1.905 m)   Wt 282 lb (127.9 kg)   BMI 35.25 kg/m  , BMI Body mass index is 35.25 kg/m. GEN: Well nourished, well developed, in no acute distress  HEENT: normal  Neck: no JVD, carotid bruits, or masses Cardiac: RRR; no murmurs, rubs, or gallops,no edema  Respiratory:  clear to auscultation bilaterally, normal work of breathing GI: soft, nontender, nondistended, + BS MS: no deformity or atrophy  Skin: warm and dry Neuro:  Strength and sensation are intact Psych: euthymic mood, full affect  EKG:  EKG is ordered today. Personal review of the ekg ordered shows sinus rhythm  Recent Labs: 09/20/2019: B Natriuretic Peptide 289.1 09/28/2019: TSH 2.357 12/06/2019: Magnesium 2.1 12/11/2019: ALT 16 12/13/2019: BUN 10; Creatinine, Ser 1.03; Hemoglobin 9.6; Platelets 289; Potassium 3.6; Sodium 142    Lipid Panel     Component Value Date/Time   CHOL 96 12/04/2019 0503   TRIG 59 12/04/2019 0503   HDL 32 (Sanchez) 12/04/2019 0503   CHOLHDL 3.0 12/04/2019 0503   VLDL 12 12/04/2019 0503   LDLCALC 52 12/04/2019 0503     Wt Readings from Last 3 Encounters:  02/17/20 282 lb (127.9 kg)  02/11/20 280 lb  (127 kg)  01/20/20 274 lb (124.3 kg)      Other studies Reviewed: Additional studies/ records that were reviewed today include: TTE 09/20/19  Review of the above records today demonstrates:  1. Left ventricular ejection fraction, by visual estimation, is 50 to  55%. The left ventricle has normal function. There is mildly increased  left ventricular hypertrophy.  2. Definity contrast agent was given IV to delineate the left ventricular  endocardial borders.  3. Left ventricular diastolic function could not be evaluated.  4. Mildly dilated left ventricular internal cavity size.  5. The left ventricle has no regional wall motion abnormalities.  6. Global right ventricle has normal systolic function.The right  ventricular size is mildly enlarged.  7. Left atrial size was mildly dilated.  8. Right atrial size was mildly dilated.  9.  Mild mitral annular calcification.  10. Trivial mitral valve regurgitation. No evidence of mitral stenosis.  11. The tricuspid valve is normal in structure. Tricuspid valve  regurgitation is trivial.  12. The aortic valve was not well visualized. Aortic valve regurgitation  is not visualized. Mild to moderate aortic valve stenosis.  13. The pulmonic valve was not well visualized. Pulmonic valve  regurgitation is not visualized.  14. The inferior vena cava is dilated in size with <50% respiratory  variability, suggesting right atrial pressure of 15 mmHg.  15. Technically difficult; definity used; low normal LV systolic function;  mild LVH; mild LVE; calcified aortic valve with mild to moderate AS (mean  gradient 19 mmHg); mild LAE/RAE/RVE.    ASSESSMENT AND PLAN:  1.  Persistent atrial fibrillation: Currently on amiodarone, Eliquis, metoprolol.  CHA2DS2-VASc of 2.  I have discussed with him getting off of amiodarone which he is agreeable to.  We Leane Loring plan for ablation.  Risks and benefits were discussed include bleeding, tamponade, heart block,  stroke, damage surrounding organs.  He understands these risks and is agreed to the procedure.   2.  Hyperlipidemia: Per primary cardiology  Case discussed with primary cardiology  Current medicines are reviewed at length with the patient today.   The patient does not have concerns regarding his medicines.  The following changes were made today: None  Labs/ tests ordered today include:  Orders Placed This Encounter  Procedures  . CT CARDIAC MORPH/PULM VEIN W/CM&W/O CA SCORE  . EKG 12-Lead     Disposition:   FU with Bernadene Garside 3 months  Signed, Juanmiguel Defelice Jorja Loa, MD  02/17/2020 5:02 PM     [facility] [address] [place] [address] [phone] (office) [phone] (fax)

## 2020-02-17 NOTE — Progress Notes (Signed)
Electrophysiology Office Note   Date:  02/17/2020   ID:  Brandon, Sanchez 1946/02/10, MRN [id number]  PCP:  Merri Brunette, MD  Cardiologist:  Cristal Deer Primary Electrophysiologist:  Tavion Senkbeil Jorja Loa, MD    Chief Complaint: AF   History of Present Illness: Brandon Sanchez is a 74 y.o. male who is being seen today for the evaluation of AF at the request of Merri Brunette, MD. Presenting today for electrophysiology evaluation.  He has a history significant for asthma, type 2 diabetes, hypertension, hyperlipidemia.  He has a recent diagnosis of atrial fibrillation.  He presented to the hospital 09/19/2019.  He was admitted with atrial fibrillation and rapid rates.  He underwent TEE and cardioversion.  He unfortunately went back into atrial fibrillation.  He was loaded on amiodarone and is now status post cardioversion 10/18/2019.  Today, denies symptoms of palpitations, chest pain, shortness of breath, orthopnea, PND, lower extremity edema, claudication, dizziness, presyncope, syncope, bleeding, or neurologic sequela. The patient is tolerating medications without difficulties. Since last being seen, he has had no further episodes of atrial fibrillation. He has been compliant with his medications. He is felt well and is without complaints today.   Past Medical History:  Diagnosis Date  . Allergic rhinitis, cause unspecified   . Asthma   . Cataract cortical, senile, bilateral   . Macular degeneration of left eye   . Other and unspecified hyperlipidemia   . Type II or unspecified type diabetes mellitus without mention of complication, not stated as uncontrolled   . Unspecified essential hypertension    Past Surgical History:  Procedure Laterality Date  . CARDIOVERSION N/A 09/22/2019   Procedure: CARDIOVERSION;  Surgeon: [name], MD;  Location: [facility] ENDOSCOPY;  Service: Endoscopy;  Laterality: N/A;  . CARDIOVERSION N/A 10/18/2019   Procedure: CARDIOVERSION;  Surgeon:  Jake Bathe, MD;  Location: [facility] ENDOSCOPY;  Service: Cardiovascular;  Laterality: N/A;  . TEE WITHOUT CARDIOVERSION N/A 09/22/2019   Procedure: TRANSESOPHAGEAL ECHOCARDIOGRAM (TEE);  Surgeon: [name], MD;  Location: [facility] ENDOSCOPY;  Service: Endoscopy;  Laterality: N/A;     Current Outpatient Medications  Medication Sig Dispense Refill  . albuterol (PROAIR HFA) 108 (90 BASE) MCG/ACT inhaler 2 puffs every 4 hours as needed only  if your can't catch your breath (Patient taking differently: Inhale 2 puffs into the lungs every 4 (four) hours as needed for wheezing. ) 1 Inhaler 1  . amiodarone (PACERONE) 200 MG tablet Take 1 tablet (200 mg total) by mouth daily. 90 tablet 2  . apixaban (ELIQUIS) 5 MG TABS tablet Take 1 tablet (5 mg total) by mouth 2 (two) times daily. 60 tablet 6  . aspirin EC 81 MG EC tablet Take 1 tablet (81 mg total) by mouth daily. 60 tablet 0  . atorvastatin (LIPITOR) 80 MG tablet Take 1 tablet (80 mg total) by mouth every evening. 30 tablet 0  . BREO ELLIPTA 200-25 MCG/INH AEPB Inhale 1 puff into the lungs daily as needed (shortness of breath).     . furosemide (LASIX) 40 MG tablet Take 1 tablet (40 mg total) by mouth daily. 90 tablet 3  . insulin aspart protamine- aspart (NOVOLOG MIX 70/30) (70-30) 100 UNIT/ML injection Inject 0.16 mLs (16 Units total) into the skin 2 (two) times daily at 8 am and 10 pm. Dose varies - Prescription is for 20 units with each meal (twice daily). However administration depends on what types of meals being taken. 10 mL 0  .  Multiple Vitamin (MULTIVITAMIN WITH MINERALS) TABS tablet Take 1 tablet by mouth daily.    Marland Kitchen MYRBETRIQ 25 MG TB24 tablet Take 25 mg by mouth daily.    Marland Kitchen omeprazole (PRILOSEC) 40 MG capsule Take 40 mg by mouth daily as needed (heart burn).     Letta Pate VERIO test strip 1 each by Other route in the morning, at noon, and at bedtime.     . polyethylene glycol (MIRALAX / GLYCOLAX) 17 g packet Take 17 g by mouth daily.  14 each 0  . PREVIDENT 5000 BOOSTER PLUS 1.1 % PSTE Place 1 application onto teeth in the morning and at bedtime.     . sertraline (ZOLOFT) 100 MG tablet Take 100 mg by mouth daily.     No current facility-administered medications for this visit.    Allergies:   Patient has no known allergies.   Social History:  The patient  reports that he has never smoked. He has never used smokeless tobacco. He reports previous alcohol use. He reports that he does not use drugs.   Family History:  The patient's family history includes Colon cancer in his father; Heart disease in his paternal grandfather.   ROS:  Please see the history of present illness.   Otherwise, review of systems is positive for none.   All other systems are reviewed and negative.   PHYSICAL EXAM: VS:  BP 118/66   Pulse 69   Ht 6\' 3"  (1.905 m)   Wt 282 lb (127.9 kg)   BMI 35.25 kg/m  , BMI Body mass index is 35.25 kg/m. GEN: Well nourished, well developed, in no acute distress  HEENT: normal  Neck: no JVD, carotid bruits, or masses Cardiac: RRR; no murmurs, rubs, or gallops,no edema  Respiratory:  clear to auscultation bilaterally, normal work of breathing GI: soft, nontender, nondistended, + BS MS: no deformity or atrophy  Skin: warm and dry Neuro:  Strength and sensation are intact Psych: euthymic mood, full affect  EKG:  EKG is ordered today. Personal review of the ekg ordered shows sinus rhythm  Recent Labs: 09/20/2019: B Natriuretic Peptide 289.1 09/28/2019: TSH 2.357 12/06/2019: Magnesium 2.1 12/11/2019: ALT 16 12/13/2019: BUN 10; Creatinine, Ser 1.03; Hemoglobin 9.6; Platelets 289; Potassium 3.6; Sodium 142    Lipid Panel     Component Value Date/Time   CHOL 96 12/04/2019 0503   TRIG 59 12/04/2019 0503   HDL 32 (L) 12/04/2019 0503   CHOLHDL 3.0 12/04/2019 0503   VLDL 12 12/04/2019 0503   LDLCALC 52 12/04/2019 0503     Wt Readings from Last 3 Encounters:  02/17/20 282 lb (127.9 kg)  02/11/20 280 lb  (127 kg)  01/20/20 274 lb (124.3 kg)      Other studies Reviewed: Additional studies/ records that were reviewed today include: TTE 09/20/19  Review of the above records today demonstrates:  1. Left ventricular ejection fraction, by visual estimation, is 50 to  55%. The left ventricle has normal function. There is mildly increased  left ventricular hypertrophy.  2. Definity contrast agent was given IV to delineate the left ventricular  endocardial borders.  3. Left ventricular diastolic function could not be evaluated.  4. Mildly dilated left ventricular internal cavity size.  5. The left ventricle has no regional wall motion abnormalities.  6. Global right ventricle has normal systolic function.The right  ventricular size is mildly enlarged.  7. Left atrial size was mildly dilated.  8. Right atrial size was mildly dilated.  9.  Mild mitral annular calcification.  10. Trivial mitral valve regurgitation. No evidence of mitral stenosis.  11. The tricuspid valve is normal in structure. Tricuspid valve  regurgitation is trivial.  12. The aortic valve was not well visualized. Aortic valve regurgitation  is not visualized. Mild to moderate aortic valve stenosis.  13. The pulmonic valve was not well visualized. Pulmonic valve  regurgitation is not visualized.  14. The inferior vena cava is dilated in size with <50% respiratory  variability, suggesting right atrial pressure of 15 mmHg.  15. Technically difficult; definity used; low normal LV systolic function;  mild LVH; mild LVE; calcified aortic valve with mild to moderate AS (mean  gradient 19 mmHg); mild LAE/RAE/RVE.    ASSESSMENT AND PLAN:  1.  Persistent atrial fibrillation: Currently on amiodarone, Eliquis, metoprolol.  CHA2DS2-VASc of 2.  I have discussed with him getting off of amiodarone which he is agreeable to.  We Zayana Salvador plan for ablation.  Risks and benefits were discussed include bleeding, tamponade, heart block,  stroke, damage surrounding organs.  He understands these risks and is agreed to the procedure.   2.  Hyperlipidemia: Per primary cardiology  Case discussed with primary cardiology  Current medicines are reviewed at length with the patient today.   The patient does not have concerns regarding his medicines.  The following changes were made today: None  Labs/ tests ordered today include:  Orders Placed This Encounter  Procedures  . CT CARDIAC MORPH/PULM VEIN W/CM&W/O CA SCORE  . EKG 12-Lead     Disposition:   FU with Martez Weiand 3 months  Signed, Damare Serano Jorja Loa, MD  02/17/2020 5:02 PM     [facility] [address] [place] [address] [phone] (office) [phone] (fax)

## 2020-02-18 ENCOUNTER — Telehealth: Payer: Self-pay | Admitting: Cardiology

## 2020-02-18 NOTE — Telephone Encounter (Signed)
New Message  Patient is calling in to confirm appointment of 03/17/20 at 7:30 am. States that he got the message and it works for him.

## 2020-02-18 NOTE — Addendum Note (Signed)
Addended by: Baird Lyons on: 02/18/2020 04:31 PM   Modules accepted: Orders

## 2020-02-18 NOTE — Telephone Encounter (Signed)
Pt informed that his procedure will be on 7/30. Aware I will follow up week after next to further go over instructions. Patient verbalized understanding and agreeable to plan.

## 2020-02-23 ENCOUNTER — Encounter: Payer: Federal, State, Local not specified - PPO | Admitting: Occupational Therapy

## 2020-02-23 ENCOUNTER — Ambulatory Visit: Payer: Federal, State, Local not specified - PPO

## 2020-02-24 ENCOUNTER — Ambulatory Visit (HOSPITAL_COMMUNITY): Payer: Federal, State, Local not specified - PPO | Admitting: Physician Assistant

## 2020-02-29 DIAGNOSIS — M545 Low back pain: Secondary | ICD-10-CM | POA: Diagnosis not present

## 2020-03-01 ENCOUNTER — Encounter: Payer: Federal, State, Local not specified - PPO | Admitting: Occupational Therapy

## 2020-03-01 NOTE — Telephone Encounter (Signed)
Called pt to go over instructions w/ new date/time of 7/30. Reports he is having lower back issues, seen by MD yesterday.  He is currently taking a prescribed muscle relaxer. He does think he will be able to lay flat on his back for upcoming procedure at the end of the month. Pt advised to continue current treatment plan on his back, pt will reach out by next Tuesday to determine if can proceed or will need to reschedule. Aware will wait to arrange covid screening once we know if proceeding w/ ablation. Pt agreeable to plan.

## 2020-03-03 ENCOUNTER — Other Ambulatory Visit: Payer: Self-pay | Admitting: *Deleted

## 2020-03-03 ENCOUNTER — Telehealth: Payer: Self-pay | Admitting: Cardiology

## 2020-03-03 DIAGNOSIS — Z01812 Encounter for preprocedural laboratory examination: Secondary | ICD-10-CM

## 2020-03-03 DIAGNOSIS — I4891 Unspecified atrial fibrillation: Secondary | ICD-10-CM

## 2020-03-03 NOTE — Patient Outreach (Signed)
[facility]) Care Management  03/03/2020  Brandon Sanchez 1946-06-13 [id number]   Call placed to member to follow up on management of medical conditions and preparation for ablation.  State he has been doing well, denies any shortness of breath or chest discomfort.  He has cardiac CT scheduled for next week, awaiting call from cardiology office regarding labs needed prior to CT.  Was last seen in by cardiologist on 7/1, procedure remains scheduled for 7/30.  He will then have follow up on 8/13 and will see Neurologist on 9/9.  Was last seen by PCP this past Tuesday. Denies any urgent concerns, encouraged to contact this care manager with questions.  Will follow up with member within the next 3 weeks, after procedure.    Goals Addressed              This Visit's Progress   .  Patient Stated: to have this procedure and get better (pt-stated)        CARE PLAN ENTRY (see longitudinal plan of care for additional care plan information)  Current Barriers:  Marland Kitchen Knowledge Deficits related to ablation procedure  Nurse Case Manager Clinical Goal(s):  Marland Kitchen Over the next 28 days, patient will attend all scheduled medical appointments: cardiology and PCP  . Over the next 28 days, patient will demonstrate improved health management independence as evidenced bycompletion of ablation . Over the next 31 days, patient will verbalize basic understanding of A-fib disease process and self health management plan as evidenced by conversion of arrythmia  Interventions:  . Inter-disciplinary care team collaboration (see longitudinal plan of care) . Provided education to patient re: process of ablation . Reviewed medications with patient and discussed importance of taking a ordered . Discussed plans with patient for ongoing care management follow up and provided patient with direct contact information for care management team . Reviewed scheduled/upcoming provider appointments including:    Patient Self Care Activities:  . Attends all scheduled provider appointments . Performs ADL's independently . Calls provider office for new concerns or questions  Initial goal documentation       Kemper Durie, RN, MSN [facility] Care Management  [facility] Manager [phone]

## 2020-03-03 NOTE — Telephone Encounter (Signed)
Order placed for BMET.  Left message for pt to call back and schedule a date to come by for labs.  Advised to do at least 2 days prior to give it time to result.

## 2020-03-03 NOTE — Telephone Encounter (Signed)
Eugine is calling stating [facility] called him yesterday stating he needs labs performed prior to his CT. Please advise.

## 2020-03-06 ENCOUNTER — Other Ambulatory Visit: Payer: Self-pay

## 2020-03-06 ENCOUNTER — Other Ambulatory Visit: Payer: Medicare Other | Admitting: *Deleted

## 2020-03-06 DIAGNOSIS — I4891 Unspecified atrial fibrillation: Secondary | ICD-10-CM

## 2020-03-06 LAB — BASIC METABOLIC PANEL
BUN/Creatinine Ratio: 25 — ABNORMAL HIGH (ref 10–24)
BUN: 32 mg/dL — ABNORMAL HIGH (ref 8–27)
CO2: 27 mmol/L (ref 20–29)
Calcium: 9.5 mg/dL (ref 8.6–10.2)
Chloride: 101 mmol/L (ref 96–106)
Creatinine, Ser: 1.27 mg/dL (ref 0.76–1.27)
GFR calc Af Amer: 64 mL/min/{1.73_m2} (ref >59)
GFR calc non Af Amer: 56 mL/min/{1.73_m2} — ABNORMAL LOW (ref >59)
Glucose: 102 mg/dL — ABNORMAL HIGH (ref 65–99)
Potassium: 4.4 mmol/L (ref 3.5–5.2)
Sodium: 141 mmol/L (ref 134–144)

## 2020-03-06 NOTE — Telephone Encounter (Signed)
Pt aware to stop by office, after CT on Friday, for CBC pre procedure blood work. Pt agreeable to plan.

## 2020-03-06 NOTE — Telephone Encounter (Signed)
Patient is calling to follow up in regards to having additional kidney lab work. However, no current orders available.

## 2020-03-09 ENCOUNTER — Telehealth (HOSPITAL_COMMUNITY): Payer: Self-pay | Admitting: *Deleted

## 2020-03-09 NOTE — Telephone Encounter (Signed)
Reaching out to patient to offer assistance regarding upcoming cardiac imaging study; pt verbalizes understanding of appt date/time, parking situation and where to check in, pre-test NPO status, and verified current allergies; name and call back number provided for further questions should they arise  [facility] [phone] office [phone] cell

## 2020-03-10 ENCOUNTER — Other Ambulatory Visit: Payer: Medicare Other | Admitting: *Deleted

## 2020-03-10 ENCOUNTER — Other Ambulatory Visit: Payer: Self-pay

## 2020-03-10 ENCOUNTER — Ambulatory Visit (HOSPITAL_COMMUNITY)
Admission: RE | Admit: 2020-03-10 | Discharge: 2020-03-10 | Disposition: A | Discharge status: Home / Self Care | Payer: Medicare Other | Source: Ambulatory Visit | Attending: Cardiology | Admitting: Cardiology

## 2020-03-10 DIAGNOSIS — Z01812 Encounter for preprocedural laboratory examination: Secondary | ICD-10-CM | POA: Diagnosis not present

## 2020-03-10 DIAGNOSIS — I4891 Unspecified atrial fibrillation: Secondary | ICD-10-CM

## 2020-03-10 LAB — CBC
Hematocrit: 38.9 % (ref 37.5–51.0)
Hemoglobin: 12.8 g/dL — ABNORMAL LOW (ref 13.0–17.7)
MCH: 28.4 pg (ref 26.6–33.0)
MCHC: 32.9 g/dL (ref 31.5–35.7)
MCV: 86 fL (ref 79–97)
Platelets: 177 10*3/uL (ref 150–450)
RBC: 4.5 x10E6/uL (ref 4.14–5.80)
RDW: 15.1 % (ref 11.6–15.4)
WBC: 8.7 10*3/uL (ref 3.4–10.8)

## 2020-03-10 MED ORDER — IOHEXOL 350 MG/ML SOLN
80.0000 mL | Freq: Once | INTRAVENOUS | Status: AC | PRN
  Administered 2020-03-10: 80 mL via INTRAVENOUS

## 2020-03-14 ENCOUNTER — Other Ambulatory Visit: Payer: Self-pay | Admitting: Physical Medicine and Rehabilitation

## 2020-03-15 ENCOUNTER — Other Ambulatory Visit (HOSPITAL_COMMUNITY)
Admission: RE | Admit: 2020-03-15 | Discharge: 2020-03-15 | Disposition: A | Discharge status: Home / Self Care | Payer: Medicare Other | Source: Ambulatory Visit | Attending: Cardiology | Admitting: Cardiology

## 2020-03-15 DIAGNOSIS — Z20822 Contact with and (suspected) exposure to covid-19: Secondary | ICD-10-CM | POA: Diagnosis not present

## 2020-03-15 DIAGNOSIS — Z01812 Encounter for preprocedural laboratory examination: Secondary | ICD-10-CM | POA: Insufficient documentation

## 2020-03-15 LAB — SARS CORONAVIRUS 2 (TAT 6-24 HRS): SARS Coronavirus 2: NEGATIVE

## 2020-03-16 ENCOUNTER — Encounter (HOSPITAL_COMMUNITY): Payer: Self-pay | Admitting: Cardiology

## 2020-03-17 ENCOUNTER — Ambulatory Visit (HOSPITAL_COMMUNITY)
Admission: RE | Admit: 2020-03-17 | Discharge: 2020-03-17 | Disposition: A | Discharge status: Home / Self Care | Payer: Medicare Other | Attending: Cardiology | Admitting: Cardiology

## 2020-03-17 ENCOUNTER — Other Ambulatory Visit: Payer: Self-pay

## 2020-03-17 ENCOUNTER — Encounter (HOSPITAL_COMMUNITY)
Admission: RE | Disposition: A | Discharge status: Home / Self Care | Payer: Federal, State, Local not specified - PPO | Source: Home / Self Care | Attending: Cardiology

## 2020-03-17 ENCOUNTER — Ambulatory Visit (HOSPITAL_COMMUNITY): Payer: Medicare Other | Admitting: Certified Registered Nurse Anesthetist

## 2020-03-17 DIAGNOSIS — Z7982 Long term (current) use of aspirin: Secondary | ICD-10-CM | POA: Diagnosis not present

## 2020-03-17 DIAGNOSIS — I10 Essential (primary) hypertension: Secondary | ICD-10-CM | POA: Diagnosis not present

## 2020-03-17 DIAGNOSIS — E785 Hyperlipidemia, unspecified: Secondary | ICD-10-CM | POA: Insufficient documentation

## 2020-03-17 DIAGNOSIS — Z79899 Other long term (current) drug therapy: Secondary | ICD-10-CM | POA: Diagnosis not present

## 2020-03-17 DIAGNOSIS — Z794 Long term (current) use of insulin: Secondary | ICD-10-CM | POA: Insufficient documentation

## 2020-03-17 DIAGNOSIS — Z7951 Long term (current) use of inhaled steroids: Secondary | ICD-10-CM | POA: Insufficient documentation

## 2020-03-17 DIAGNOSIS — Z7901 Long term (current) use of anticoagulants: Secondary | ICD-10-CM | POA: Diagnosis not present

## 2020-03-17 DIAGNOSIS — J45909 Unspecified asthma, uncomplicated: Secondary | ICD-10-CM | POA: Insufficient documentation

## 2020-03-17 DIAGNOSIS — I4891 Unspecified atrial fibrillation: Secondary | ICD-10-CM | POA: Diagnosis not present

## 2020-03-17 DIAGNOSIS — D649 Anemia, unspecified: Secondary | ICD-10-CM | POA: Diagnosis not present

## 2020-03-17 DIAGNOSIS — I4819 Other persistent atrial fibrillation: Secondary | ICD-10-CM | POA: Diagnosis not present

## 2020-03-17 DIAGNOSIS — E119 Type 2 diabetes mellitus without complications: Secondary | ICD-10-CM | POA: Insufficient documentation

## 2020-03-17 HISTORY — PX: ATRIAL FIBRILLATION ABLATION: EP1191

## 2020-03-17 LAB — POCT ACTIVATED CLOTTING TIME
Activated Clotting Time: 230 seconds
Activated Clotting Time: 307 seconds
Activated Clotting Time: 307 seconds
Activated Clotting Time: 323 seconds
Activated Clotting Time: 318 seconds
Activated Clotting Time: 323 seconds

## 2020-03-17 LAB — GLUCOSE, CAPILLARY
Glucose-Capillary: 148 mg/dL — ABNORMAL HIGH (ref 70–99)
Glucose-Capillary: 179 mg/dL — ABNORMAL HIGH (ref 70–99)

## 2020-03-17 SURGERY — ATRIAL FIBRILLATION ABLATION
Anesthesia: General

## 2020-03-17 MED ORDER — HEPARIN SODIUM (PORCINE) 1000 UNIT/ML IJ SOLN
INTRAMUSCULAR | Status: AC
  Filled 2020-03-17: qty 1

## 2020-03-17 MED ORDER — PROTAMINE SULFATE 10 MG/ML IV SOLN
INTRAVENOUS | Status: DC | PRN
  Administered 2020-03-17: 70 mg via INTRAVENOUS

## 2020-03-17 MED ORDER — LIDOCAINE 2% (20 MG/ML) 5 ML SYRINGE
INTRAMUSCULAR | Status: DC | PRN
  Administered 2020-03-17: 80 mg via INTRAVENOUS

## 2020-03-17 MED ORDER — PROPOFOL 10 MG/ML IV BOLUS
INTRAVENOUS | Status: DC | PRN
  Administered 2020-03-17: 120 mg via INTRAVENOUS
  Administered 2020-03-17: 20 mg via INTRAVENOUS

## 2020-03-17 MED ORDER — HEPARIN (PORCINE) IN NACL 1000-0.9 UT/500ML-% IV SOLN
INTRAVENOUS | Status: AC
  Filled 2020-03-17: qty 500

## 2020-03-17 MED ORDER — HEPARIN SODIUM (PORCINE) 1000 UNIT/ML IJ SOLN
INTRAMUSCULAR | Status: DC | PRN
  Administered 2020-03-17: 1000 [IU] via INTRAVENOUS

## 2020-03-17 MED ORDER — SODIUM CHLORIDE 0.9% FLUSH: 3.0000 mL | Freq: Two times a day (BID) | INTRAVENOUS | Status: DC

## 2020-03-17 MED ORDER — ROCURONIUM BROMIDE 10 MG/ML (PF) SYRINGE
PREFILLED_SYRINGE | INTRAVENOUS | Status: DC | PRN
  Administered 2020-03-17: 80 mg via INTRAVENOUS

## 2020-03-17 MED ORDER — SUGAMMADEX SODIUM 200 MG/2ML IV SOLN
INTRAVENOUS | Status: DC | PRN
  Administered 2020-03-17: 200 mg via INTRAVENOUS

## 2020-03-17 MED ORDER — DEXAMETHASONE SODIUM PHOSPHATE 10 MG/ML IJ SOLN
INTRAMUSCULAR | Status: DC | PRN
Start: 2020-03-17 — End: 2020-03-17
  Administered 2020-03-17: 10 mg via INTRAVENOUS

## 2020-03-17 MED ORDER — SODIUM CHLORIDE 0.9% FLUSH: 3.0000 mL | INTRAVENOUS | Status: DC | PRN

## 2020-03-17 MED ORDER — ACETAMINOPHEN 325 MG PO TABS
650.0000 mg | ORAL_TABLET | ORAL | Status: DC | PRN
  Filled 2020-03-17: qty 2

## 2020-03-17 MED ORDER — HEPARIN SODIUM (PORCINE) 1000 UNIT/ML IJ SOLN
INTRAMUSCULAR | Status: DC | PRN
  Administered 2020-03-17: 16000 [IU] via INTRAVENOUS
  Administered 2020-03-17: 3000 [IU] via INTRAVENOUS
  Administered 2020-03-17: 4000 [IU] via INTRAVENOUS
  Administered 2020-03-17: 10000 [IU] via INTRAVENOUS
  Administered 2020-03-17 (×2): 2000 [IU] via INTRAVENOUS

## 2020-03-17 MED ORDER — SODIUM CHLORIDE 0.9 % IV SOLN: 250.0000 mL | INTRAVENOUS | Status: DC | PRN

## 2020-03-17 MED ORDER — ONDANSETRON HCL 4 MG/2ML IJ SOLN
INTRAMUSCULAR | Status: DC | PRN
  Administered 2020-03-17: 4 mg via INTRAVENOUS

## 2020-03-17 MED ORDER — SODIUM CHLORIDE 0.9 % IV SOLN: INTRAVENOUS | Status: DC

## 2020-03-17 MED ORDER — ONDANSETRON HCL 4 MG/2ML IJ SOLN: 4.0000 mg | Freq: Four times a day (QID) | INTRAMUSCULAR | Status: DC | PRN

## 2020-03-17 MED ORDER — HEPARIN (PORCINE) IN NACL 1000-0.9 UT/500ML-% IV SOLN
INTRAVENOUS | Status: DC | PRN
  Administered 2020-03-17 (×5): 500 mL

## 2020-03-17 MED ORDER — FENTANYL CITRATE (PF) 100 MCG/2ML IJ SOLN
INTRAMUSCULAR | Status: DC | PRN
  Administered 2020-03-17: 100 ug via INTRAVENOUS

## 2020-03-17 MED ORDER — PHENYLEPHRINE HCL-NACL 10-0.9 MG/250ML-% IV SOLN
INTRAVENOUS | Status: DC | PRN
  Administered 2020-03-17: 20 ug/min via INTRAVENOUS

## 2020-03-17 SURGICAL SUPPLY — 23 items
BAG SNAP BAND KOVER 36X36 (MISCELLANEOUS) ×2 IMPLANT
BLANKET WARM UNDERBOD FULL ACC (MISCELLANEOUS) ×2 IMPLANT
CATH 8FR REPROCESSED SOUNDSTAR (CATHETERS) ×2 IMPLANT
CATH MAPPNG PENTARAY F 2-6-2MM (CATHETERS) ×1 IMPLANT
CATH SMTCH THERMOCOOL SF DF (CATHETERS) ×2 IMPLANT
CATH WEBSTER BI DIR CS D-F CRV (CATHETERS) ×2 IMPLANT
COVER SWIFTLINK CONNECTOR (BAG) ×2 IMPLANT
DEVICE CLOSURE PERCLS PRGLD 6F (VASCULAR PRODUCTS) ×5 IMPLANT
PACK EP LATEX FREE (CUSTOM PROCEDURE TRAY) ×2
PACK EP LF (CUSTOM PROCEDURE TRAY) ×1 IMPLANT
PAD PRO RADIOLUCENT 2001M-C (PAD) ×2 IMPLANT
PATCH CARTO3 (PAD) ×2 IMPLANT
PENTARAY F 2-6-2MM (CATHETERS) ×2
PERCLOSE PROGLIDE 6F (VASCULAR PRODUCTS) ×10
SHEATH BAYLIS SUREFLEX  M 8.5 (SHEATH) ×1
SHEATH BAYLIS SUREFLEX M 8.5 (SHEATH) ×1 IMPLANT
SHEATH BAYLIS TRANSSEPTAL 98CM (NEEDLE) ×2 IMPLANT
SHEATH CARTO VIZIGO SM CVD (SHEATH) ×2 IMPLANT
SHEATH PINNACLE 7F 10CM (SHEATH) ×2 IMPLANT
SHEATH PINNACLE 8F 10CM (SHEATH) ×4 IMPLANT
SHEATH PINNACLE 9F 10CM (SHEATH) ×2 IMPLANT
SHEATH PROBE COVER 6X72 (BAG) ×2 IMPLANT
TUBING SMART ABLATE COOLFLOW (TUBING) ×2 IMPLANT

## 2020-03-17 NOTE — Discharge Instructions (Signed)
Cardiac Ablation, Care After This sheet gives you information about how to care for yourself after your procedure. Your health care provider may also give you more specific instructions. If you have problems or questions, contact your health care provider. What can I expect after the procedure? After the procedure, it is common to have:  Bruising around your puncture site.  Tenderness around your puncture site.  Skipped heartbeats.  Tiredness (fatigue). Follow these instructions at home: Puncture site care   Follow instructions from your health care provider about how to take care of your puncture site. Make sure you: ? Wash your hands with soap and water before you change your bandage (dressing). If soap and water are not available, use hand sanitizer. ? Change your dressing as told by your health care provider. ? Leave stitches (sutures), skin glue, or adhesive strips in place. These skin closures may need to stay in place for up to 2 weeks. If adhesive strip edges start to loosen and curl up, you may trim the loose edges. Do not remove adhesive strips completely unless your health care provider tells you to do that.  Check your puncture site every day for signs of infection. Check for: ? Redness, swelling, or pain. ? Fluid or blood. If your puncture site starts to bleed, lie down on your back, apply firm pressure to the area, and contact your health care provider. ? Warmth. ? Pus or a bad smell. Driving  Ask your health care provider when it is safe for you to drive again after the procedure.  Do not drive or use heavy machinery while taking prescription pain medicine.  Do not drive for 24 hours if you were given a medicine to help you relax (sedative) during your procedure. Activity  Avoid activities that take a lot of effort for at least 3 days after your procedure.  Do not lift anything that is heavier than 10 lb (4.5 kg), or the limit that you are told, until your health  care provider says that it is safe.  Return to your normal activities as told by your health care provider. Ask your health care provider what activities are safe for you. General instructions  Take over-the-counter and prescription medicines only as told by your health care provider.  Do not use any products that contain nicotine or tobacco, such as cigarettes and e-cigarettes. If you need help quitting, ask your health care provider.  Do not take baths, swim, or use a hot tub until your health care provider approves.  Do not drink alcohol for 24 hours after your procedure.  Keep all follow-up visits as told by your health care provider. This is important. Contact a health care provider if:  You have redness, mild swelling, or pain around your puncture site.  You have fluid or blood coming from your puncture site that stops after applying firm pressure to the area.  Your puncture site feels warm to the touch.  You have pus or a bad smell coming from your puncture site.  You have a fever.  You have chest pain or discomfort that spreads to your neck, jaw, or arm.  You are sweating a lot.  You feel nauseous.  You have a fast or irregular heartbeat.  You have shortness of breath.  You are dizzy or light-headed and feel the need to lie down.  You have pain or numbness in the arm or leg closest to your puncture site. Get help right away if:  Your puncture   site suddenly swells.  Your puncture site is bleeding and the bleeding does not stop after applying firm pressure to the area. These symptoms may represent a serious problem that is an emergency. Do not wait to see if the symptoms will go away. Get medical help right away. Call your local emergency services (911 in the [place] Do not drive yourself to the hospital. Summary  After the procedure, it is normal to have bruising and tenderness at the puncture site in your groin, neck, or forearm.  Check your puncture site every  day for signs of infection.  Get help right away if your puncture site is bleeding and the bleeding does not stop after applying firm pressure to the area. This is a medical emergency. This information is not intended to replace advice given to you by your health care provider. Make sure you discuss any questions you have with your health care provider. Document Revised: 07/18/2017 Document Reviewed: 11/14/2016 Elsevier Patient Education  2020 [employer].    Post procedure care instructions No driving for 4 days. No lifting over 5 lbs for 1 week. No vigorous or sexual activity for 1 week. You may return to work/your usual activities on 03/24/2020. Keep procedure site clean & dry. If you notice increased pain, swelling, bleeding or pus, call/return!  You may shower, but no soaking baths/hot tubs/pools for 1 week.    You have an appointment set up with the Atrial Fibrillation Clinic.  Multiple studies have shown that being followed by a dedicated atrial fibrillation clinic in addition to the standard care you receive from your other physicians improves health. We believe that enrollment in the atrial fibrillation clinic will allow [place] to better care for you.   The phone number to the Atrial Fibrillation Clinic is [phone]. The clinic is staffed Monday through Friday from 8:30am to 5pm.  Parking Directions: The clinic is located in the Heart and Vascular Building connected to [facility]. 1)From [address] turn on to [address] and go to the 3rd entrance  (Heart and Vascular entrance) on the right. 2)Look to the right for Heart &Vascular Parking Garage. 3)A code for the entrance is required, the code is 4008.   4)Take the elevators to the 1st floor. Registration is in the room with the glass walls at the end of the hallway.  If you have any trouble parking or locating the clinic, please don't hesitate to call [phone].

## 2020-03-17 NOTE — Progress Notes (Signed)
Left groin with area of firmness noted medial and distal to left groin site; client without c/o of discomfort; pressure held x 15 min and left groin soft

## 2020-03-17 NOTE — Transfer of Care (Signed)
Immediate Anesthesia Transfer of Care Note  Patient: ISA KOHLENBERG  Procedure(s) Performed: ATRIAL FIBRILLATION ABLATION (N/A )  Patient Location: Cath Lab  Anesthesia Type:General  Level of Consciousness: awake, alert  and oriented  Airway & Oxygen Therapy: Patient Spontanous Breathing and Patient connected to nasal cannula oxygen  Post-op Assessment: Report given to RN and Post -op Vital signs reviewed and stable  Post vital signs: Reviewed and stable  Last Vitals:  Vitals Value Taken Time  BP 134/69 03/17/20 1200  Temp    Pulse 70 03/17/20 1201  Resp 17 03/17/20 1201  SpO2 97 % 03/17/20 1201  Vitals shown include unvalidated device data.  Last Pain:  Vitals:   03/17/20 0602  TempSrc:   PainSc: 0-No pain         Complications: No complications documented.

## 2020-03-17 NOTE — Progress Notes (Signed)
Dr Elberta Fortis in to check client and check both groins; client up and walked and tolerated well;bilat groins stable, no bleeding or hematoma

## 2020-03-17 NOTE — Anesthesia Preprocedure Evaluation (Addendum)
Anesthesia Evaluation  Patient identified by MRN, date of birth, ID band Patient awake    Reviewed: Allergy & Precautions, NPO status , Patient's Chart, lab work & pertinent test results  History of Anesthesia Complications Negative for: history of anesthetic complications  Airway Mallampati: III  TM Distance: >3 FB Neck ROM: Full    Dental  (+) Dental Advisory Given, Teeth Intact   Pulmonary neg shortness of breath, asthma , neg sleep apnea, neg recent URI,    breath sounds clear to auscultation       Cardiovascular hypertension, Pt. on medications  Rhythm:Regular  1. Left ventricular ejection fraction, by estimation, is 55 to 60%. The  left ventricle has normal function. The left ventricle has no regional  wall motion abnormalities. The left ventricular internal cavity size was  mildly dilated. There is mild  concentric left ventricular hypertrophy. Left ventricular diastolic  parameters are consistent with Grade III diastolic dysfunction  (restrictive). Elevated left ventricular end-diastolic pressure.  2. Right ventricular systolic function is normal. The right ventricular  size is mildly enlarged.  3. Left atrial size was mildly dilated.  4. Right atrial size was mild to moderately dilated.  5. The mitral valve is grossly normal. Trivial mitral valve  regurgitation. No evidence of mitral stenosis.  6. The aortic valve is tricuspid. Aortic valve regurgitation is mild to  moderate. Moderate aortic valve stenosis.  7. The inferior vena cava is dilated in size with >50% respiratory  variability, suggesting right atrial pressure of 8 mmHg.    Neuro/Psych CVA, No Residual Symptoms negative psych ROS   GI/Hepatic Neg liver ROS, GERD  Medicated and Controlled,  Endo/Other  diabetes, Insulin Dependent  Renal/GU negative Renal ROS     Musculoskeletal   Abdominal   Peds  Hematology  (+) Blood dyscrasia, anemia ,  Eliquis   Anesthesia Other Findings   Reproductive/Obstetrics                            Anesthesia Physical Anesthesia Plan  ASA: III  Anesthesia Plan: General   Post-op Pain Management:    Induction: Intravenous  PONV Risk Score and Plan: 2 and Ondansetron and Dexamethasone  Airway Management Planned: Oral ETT  Additional Equipment: None  Intra-op Plan:   Post-operative Plan: Extubation in OR  Informed Consent: I have reviewed the patients History and Physical, chart, labs and discussed the procedure including the risks, benefits and alternatives for the proposed anesthesia with the patient or authorized representative who has indicated his/her understanding and acceptance.     Dental advisory given  Plan Discussed with: CRNA and Surgeon  Anesthesia Plan Comments:         Anesthesia Quick Evaluation

## 2020-03-17 NOTE — Interval H&P Note (Signed)
History and Physical Interval Note:  Brandon Sanchez has presented today for surgery, with the diagnosis of atrial fibrillation.  The various methods of treatment have been discussed with the patient and family. After consideration of risks, benefits and other options for treatment, the patient has consented to  Procedure(s): Catheter ablation as a surgical intervention .  Risks include but not limited to bleeding, tamponade, heart block, stroke, damage to surrounding organs, among others. The patient's history has been reviewed, patient examined, no change in status, stable for surgery.  I have reviewed the patient's chart and labs.  Questions were answered to the patient's satisfaction.    Sahith Nurse Elberta Fortis, MD 03/17/2020 7:06 AM

## 2020-03-17 NOTE — Anesthesia Postprocedure Evaluation (Signed)
Anesthesia Post Note  Patient: LANG ZINGG  Procedure(s) Performed: ATRIAL FIBRILLATION ABLATION (N/A )     Patient location during evaluation: PACU Anesthesia Type: General Level of consciousness: awake and alert Pain management: pain level controlled Vital Signs Assessment: post-procedure vital signs reviewed and stable Respiratory status: spontaneous breathing, nonlabored ventilation, respiratory function stable and patient connected to nasal cannula oxygen Cardiovascular status: blood pressure returned to baseline and stable Postop Assessment: no apparent nausea or vomiting Anesthetic complications: no   No complications documented.  Last Vitals:  Vitals:   03/17/20 1300 03/17/20 1315  BP: (!) 130/59 120/70  Pulse: 77 77  Resp: 16 18  Temp:    SpO2: 91% 91%    Last Pain:  Vitals:   03/17/20 1202  TempSrc:   PainSc: 0-No pain                 Beryle Lathe

## 2020-03-17 NOTE — Anesthesia Procedure Notes (Signed)
Procedure Name: Intubation Date/Time: 03/17/2020 7:47 AM Performed by: Candis Shine, CRNA Pre-anesthesia Checklist: Patient identified, Emergency Drugs available, Suction available and Patient being monitored Patient Re-evaluated:Patient Re-evaluated prior to induction Oxygen Delivery Method: Circle System Utilized Preoxygenation: Pre-oxygenation with 100% oxygen Induction Type: IV induction Ventilation: Mask ventilation without difficulty and Oral airway inserted - appropriate to patient size Laryngoscope Size: Mac and 4 Grade View: Grade I Tube type: Oral Tube size: 7.5 mm Number of attempts: 1 Airway Equipment and Method: Stylet and Oral airway Placement Confirmation: ETT inserted through vocal cords under direct vision,  positive ETCO2 and breath sounds checked- equal and bilateral Secured at: 23 cm Tube secured with: Tape Dental Injury: Teeth and Oropharynx as per pre-operative assessment

## 2020-03-20 ENCOUNTER — Other Ambulatory Visit: Payer: Self-pay | Admitting: *Deleted

## 2020-03-20 ENCOUNTER — Encounter (HOSPITAL_COMMUNITY): Payer: Self-pay | Admitting: Cardiology

## 2020-03-20 NOTE — Patient Outreach (Signed)
[facility]) Care Management  03/20/2020  Brandon Sanchez 12/25/1945 [id number]   Call placed to member to follow up on discharge after having ablation.  Report procedure was successful, denies any shortness of breath or chest discomfort.  He has continued to monitor his blood pressure and HR, ranging 110's-120's/80s, and HR 60-70's.  He has follow up with cardiologist on 8/13 and with A-fib clinic on 8/27.  Denies any urgent concerns, agrees to follow up within the next month.  Goals Addressed              This Visit's Progress   .  Patient Stated: to have this procedure and get better (pt-stated)   On track     CARE PLAN ENTRY (see longitudinal plan of care for additional care plan information)  Current Barriers:  Marland Kitchen Knowledge Deficits related to ablation procedure  Nurse Case Manager Clinical Goal(s):  Marland Kitchen Over the next 28 days, patient will attend all scheduled medical appointments: cardiology and PCP  . Over the next 28 days, patient will demonstrate improved health management independence as evidenced bycompletion of ablation . Over the next 31 days, patient will verbalize basic understanding of A-fib disease process and self health management plan as evidenced by conversion of arrythmia  Interventions:  . Inter-disciplinary care team collaboration (see longitudinal plan of care) . Provided education to patient re: process of ablation . Reviewed medications with patient and discussed importance of taking a ordered . Discussed plans with patient for ongoing care management follow up and provided patient with direct contact information for care management team . Reviewed scheduled/upcoming provider appointments including:   Patient Self Care Activities:  . Attends all scheduled provider appointments . Performs ADL's independently . Calls provider office for new concerns or questions  Initial goal documentation       Kemper Durie, RN, MSN [facility] Care  Management  [facility] Manager [phone]

## 2020-03-23 NOTE — Telephone Encounter (Signed)
Per Dr. Elberta Fortis - have AFib clinic follow up with pt (forwarding to [facility] as I am out of the office)

## 2020-03-31 ENCOUNTER — Ambulatory Visit: Payer: Medicare Other | Admitting: Cardiology

## 2020-04-14 ENCOUNTER — Ambulatory Visit (HOSPITAL_COMMUNITY)
Admission: RE | Admit: 2020-04-14 | Discharge: 2020-04-14 | Disposition: A | Discharge status: Home / Self Care | Payer: Medicare Other | Source: Ambulatory Visit | Attending: Physician Assistant | Admitting: Physician Assistant

## 2020-04-14 ENCOUNTER — Other Ambulatory Visit: Payer: Self-pay

## 2020-04-14 VITALS — BP 120/62 | HR 65 | Ht 75.0 in | Wt 293.8 lb

## 2020-04-14 DIAGNOSIS — E119 Type 2 diabetes mellitus without complications: Secondary | ICD-10-CM | POA: Insufficient documentation

## 2020-04-14 DIAGNOSIS — Z79899 Other long term (current) drug therapy: Secondary | ICD-10-CM | POA: Insufficient documentation

## 2020-04-14 DIAGNOSIS — E785 Hyperlipidemia, unspecified: Secondary | ICD-10-CM | POA: Diagnosis not present

## 2020-04-14 DIAGNOSIS — Z6836 Body mass index (BMI) 36.0-36.9, adult: Secondary | ICD-10-CM | POA: Insufficient documentation

## 2020-04-14 DIAGNOSIS — Z8673 Personal history of transient ischemic attack (TIA), and cerebral infarction without residual deficits: Secondary | ICD-10-CM | POA: Diagnosis not present

## 2020-04-14 DIAGNOSIS — J45909 Unspecified asthma, uncomplicated: Secondary | ICD-10-CM | POA: Diagnosis not present

## 2020-04-14 DIAGNOSIS — I10 Essential (primary) hypertension: Secondary | ICD-10-CM | POA: Insufficient documentation

## 2020-04-14 DIAGNOSIS — I4819 Other persistent atrial fibrillation: Secondary | ICD-10-CM | POA: Insufficient documentation

## 2020-04-14 DIAGNOSIS — D6869 Other thrombophilia: Secondary | ICD-10-CM | POA: Insufficient documentation

## 2020-04-14 DIAGNOSIS — E669 Obesity, unspecified: Secondary | ICD-10-CM | POA: Insufficient documentation

## 2020-04-14 DIAGNOSIS — Z7982 Long term (current) use of aspirin: Secondary | ICD-10-CM | POA: Diagnosis not present

## 2020-04-14 DIAGNOSIS — Z7901 Long term (current) use of anticoagulants: Secondary | ICD-10-CM | POA: Insufficient documentation

## 2020-04-14 NOTE — Progress Notes (Signed)
Primary Care Physician: Merri Brunette, MD Primary Cardiologist: Dr Cristal Deer Primary Electrophysiologist: Dr Elberta Fortis Referring Physician: Dr Fletcher Anon is a 74 y.o. male with a history of asthma, type II diabetes, dyslipidemia, hypertension, prior CVA, and persistent atrial fibrillation who presents for follow up in the [facility].  The patient was initially diagnosed with atrial fibrillation on 09/20/19 after presenting with symptoms of worsening fatigue and SOB. He was found to be in afib with RVR in the setting of sepsis from CAP. Patient was started on Eliquis for a CHADS2VASC score of 5. He underwent TEE/DCCV on 09/22/19. Echo showed preserved EF 50-55% with mildly dilated LA. He does admit to drinking 2-3 glasses of wine daily prior to his hospitalization. He also states that he snores and frequently naps during the day. Patient is s/p repeat DCCV 10/18/19.   On follow up today, patient was admitted 11/2019 for an acute CVA. Eliquis was continued per neurology. He is s/p ablation with Dr Elberta Fortis on 03/17/20. He has done well since his procedure with no symptoms of afib. He denies CP, swallowing, or groin issues.   Today, he denies symptoms of palpitations, chest pain, orthopnea, PND, dizziness, presyncope, syncope, bleeding, or neurologic sequela. The patient is tolerating medications without difficulties and is otherwise without complaint today.    Atrial Fibrillation Risk Factors:  he does not have symptoms of sleep apnea. Negative sleep study. he does not have a history of rheumatic fever. he does have a history of alcohol use. The patient does not have a history of early familial atrial fibrillation or other arrhythmias.  he has a BMI of Body mass index is 36.72 kg/m.Marland Kitchen Filed Weights   04/14/20 1051  Weight: 133.3 kg    Family History  Problem Relation Age of Onset  . Colon cancer Father   . Heart disease Paternal Grandfather       Atrial Fibrillation Management history:  Previous antiarrhythmic drugs: amiodarone Previous cardioversions: 09/22/19, 10/18/19 Previous ablations: 03/17/20 CHADS2VASC score: 5 Anticoagulation history: Eliquis   Past Medical History:  Diagnosis Date  . Allergic rhinitis, cause unspecified   . Asthma   . Cataract cortical, senile, bilateral   . Macular degeneration of left eye   . Other and unspecified hyperlipidemia   . Type II or unspecified type diabetes mellitus without mention of complication, not stated as uncontrolled   . Unspecified essential hypertension    Past Surgical History:  Procedure Laterality Date  . ATRIAL FIBRILLATION ABLATION N/A 03/17/2020   Procedure: ATRIAL FIBRILLATION ABLATION;  Surgeon: Regan Lemming, MD;  Location: [facility] INVASIVE CV LAB;  Service: Cardiovascular;  Laterality: N/A;  . CARDIOVERSION N/A 09/22/2019   Procedure: CARDIOVERSION;  Surgeon: [name], MD;  Location: [facility] ENDOSCOPY;  Service: Endoscopy;  Laterality: N/A;  . CARDIOVERSION N/A 10/18/2019   Procedure: CARDIOVERSION;  Surgeon: Jake Bathe, MD;  Location: [facility] ENDOSCOPY;  Service: Cardiovascular;  Laterality: N/A;  . TEE WITHOUT CARDIOVERSION N/A 09/22/2019   Procedure: TRANSESOPHAGEAL ECHOCARDIOGRAM (TEE);  Surgeon: [name], MD;  Location: [facility] ENDOSCOPY;  Service: Endoscopy;  Laterality: N/A;    Current Outpatient Medications  Medication Sig Dispense Refill  . albuterol (PROAIR HFA) 108 (90 BASE) MCG/ACT inhaler 2 puffs every 4 hours as needed only  if your can't catch your breath (Patient taking differently: Inhale 2 puffs into the lungs every 4 (four) hours as needed for wheezing. ) 1 Inhaler 1  . amiodarone (PACERONE) 200  MG tablet Take 1 tablet (200 mg total) by mouth daily. 90 tablet 2  . apixaban (ELIQUIS) 5 MG TABS tablet Take 1 tablet (5 mg total) by mouth 2 (two) times daily. 60 tablet 6  . aspirin EC 81 MG EC tablet Take 1 tablet (81 mg total) by  mouth daily. 60 tablet 0  . atorvastatin (LIPITOR) 80 MG tablet Take 1 tablet (80 mg total) by mouth every evening. 30 tablet 0  . B-D ULTRAFINE III SHORT PEN 31G X 8 MM MISC SMARTSIG:Pre-Filled Pen Syringe SUB-Q 3 Times Daily    . BREO ELLIPTA 200-25 MCG/INH AEPB Inhale 1 puff into the lungs daily as needed (shortness of breath).     . cyclobenzaprine (FLEXERIL) 10 MG tablet Take 10 mg by mouth as needed. For back pain    . furosemide (LASIX) 40 MG tablet Take 1 tablet (40 mg total) by mouth daily. 90 tablet 3  . insulin aspart protamine- aspart (NOVOLOG MIX 70/30) (70-30) 100 UNIT/ML injection Inject 0.16 mLs (16 Units total) into the skin 2 (two) times daily at 8 am and 10 pm. Dose varies - Prescription is for 20 units with each meal (twice daily). However administration depends on what types of meals being taken. (Patient taking differently: Inject 20 Units into the skin 2 (two) times daily at 8 am and 10 pm. Dose varies - Prescription is for 20 units with each meal (twice daily). However administration depends on what types of meals being taken.) 10 mL 0  . Multiple Vitamin (MULTIVITAMIN WITH MINERALS) TABS tablet Take 1 tablet by mouth daily.    Marland Kitchen MYRBETRIQ 25 MG TB24 tablet Take 25 mg by mouth daily.    Marland Kitchen omeprazole (PRILOSEC) 40 MG capsule Take 40 mg by mouth daily as needed (heart burn).     Letta Pate VERIO test strip 1 each by Other route in the morning, at noon, and at bedtime.     . polyethylene glycol (MIRALAX / GLYCOLAX) 17 g packet Take 17 g by mouth daily. 14 each 0  . PREVIDENT 5000 BOOSTER PLUS 1.1 % PSTE Place 1 application onto teeth in the morning and at bedtime.     . sertraline (ZOLOFT) 100 MG tablet Take 100 mg by mouth daily.     No current facility-administered medications for this encounter.    No Known Allergies  Social History   Socioeconomic History  . Marital status: Divorced    Spouse name: Not on file  . Number of children: Not on file  . Years of  education: Not on file  . Highest education level: Professional school degree (e.g., MD, DDS, DVM, JD)  Occupational History  . Occupation: Armed forces logistics/support/administrative officer  Tobacco Use  . Smoking status: Never Smoker  . Smokeless tobacco: Never Used  Substance and Sexual Activity  . Alcohol use: Not Currently    Comment: Used to drink 2 L bottle of wine/day. Quit 09/18/19  . Drug use: Never  . Sexual activity: Not Currently  Other Topics Concern  . Not on file  Social History Narrative  . Not on file   Social Determinants of Health   Financial Resource Strain:   . Difficulty of Paying Living Expenses: Not on file  Food Insecurity: No Food Insecurity  . Worried About Programme researcher, broadcasting/film/video in the Last Year: Never true  . Ran Out of Food in the Last Year: Never true  Transportation Needs: No Transportation Needs  . Lack of Transportation (Medical): No  .  Lack of Transportation (Non-Medical): No  Physical Activity:   . Days of Exercise per Week: Not on file  . Minutes of Exercise per Session: Not on file  Stress:   . Feeling of Stress : Not on file  Social Connections:   . Frequency of Communication with Friends and Family: Not on file  . Frequency of Social Gatherings with Friends and Family: Not on file  . Attends Religious Services: Not on file  . Active Member of Clubs or Organizations: Not on file  . Attends BankerClub or Organization Meetings: Not on file  . Marital Status: Not on file  Intimate Partner Violence:   . Fear of Current or Ex-Partner: Not on file  . Emotionally Abused: Not on file  . Physically Abused: Not on file  . Sexually Abused: Not on file     ROS- All systems are reviewed and negative except as per the HPI above.  Physical Exam: Vitals:   04/14/20 1051  BP: 120/62  Pulse: 65  Weight: 133.3 kg  Height: 6\' 3"  (1.905 m)    GEN- The patient is well appearing obese male, alert and oriented x 3 today.   HEENT-head normocephalic, atraumatic, sclera clear,  conjunctiva pink, hearing intact, trachea midline. Lungs- Clear to ausculation bilaterally, normal work of breathing Heart- Regular rate and rhythm, no rubs or gallops, 2/6 systolic murmur GI- soft, NT, ND, + BS Extremities- no clubbing, cyanosis, or edema MS- no significant deformity or atrophy Skin- no rash or lesion Psych- euthymic mood, full affect Neuro- strength and sensation are intact   Wt Readings from Last 3 Encounters:  04/14/20 133.3 kg  03/17/20 (!) 124.7 kg  02/17/20 127.9 kg    EKG today demonstrates SR HR 65, LAFB, slow R wave prog, PR 184, QRS 112, QTc 438  Echo 09/20/19 demonstrated  1. Left ventricular ejection fraction, by visual estimation, is 50 to  55%. The left ventricle has normal function. There is mildly increased  left ventricular hypertrophy.  2. Definity contrast agent was given IV to delineate the left ventricular  endocardial borders.  3. Left ventricular diastolic function could not be evaluated.  4. Mildly dilated left ventricular internal cavity size.  5. The left ventricle has no regional wall motion abnormalities.  6. Global right ventricle has normal systolic function.The right  ventricular size is mildly enlarged.  7. Left atrial size was mildly dilated.  8. Right atrial size was mildly dilated.  9. Mild mitral annular calcification.  10. Trivial mitral valve regurgitation. No evidence of mitral stenosis.  11. The tricuspid valve is normal in structure. Tricuspid valve  regurgitation is trivial.  12. The aortic valve was not well visualized. Aortic valve regurgitation  is not visualized. Mild to moderate aortic valve stenosis.  13. The pulmonic valve was not well visualized. Pulmonic valve  regurgitation is not visualized.  14. The inferior vena cava is dilated in size with <50% respiratory  variability, suggesting right atrial pressure of 15 mmHg.  15. Technically difficult; definity used; low normal LV systolic function;  mild  LVH; mild LVE; calcified aortic valve with mild to moderate AS (mean  gradient 19 mmHg); mild LAE/RAE/RVE.   Epic records are reviewed at length today  CHA2DS2-VASc Score = 5  The patient's score is based upon: CHF History: 0 HTN History: 1 Age : 1 Diabetes History: 1 Stroke History: 2 Vascular Disease History: 0 Gender: 0      ASSESSMENT AND PLAN: 1. Persistent Atrial Fibrillation (ICD10:  I48.19) The patient's CHA2DS2-VASc score is 5, indicating a 7.2% annual risk of stroke.   S/p afib ablation with Dr Elberta Fortis 03/17/20. Patient appears to be maintaining SR. Continue amiodarone 200 mg daily for now. Continue Eliquis 5 mg BID  2. Secondary Hypercoagulable State (ICD10:  D68.69) The patient is at significant risk for stroke/thromboembolism based upon his CHA2DS2-VASc Score of 5.  Continue Apixaban (Eliquis).   3. Obesity Body mass index is 36.72 kg/m. Lifestyle modification was discussed and encouraged including regular physical activity and weight reduction.  4. HTN Stable, no changes today.    Follow up with Dr Cristal Deer and Dr Elberta Fortis as scheduled.    Jorja Loa PA-C Af[facility] [address] [place], [place] [address] [phone] 04/14/2020 12:20 PM

## 2020-04-19 ENCOUNTER — Other Ambulatory Visit: Payer: Self-pay | Admitting: *Deleted

## 2020-04-19 NOTE — Patient Outreach (Signed)
[facility]) Care Management  04/19/2020  Brandon Sanchez [birth year] [id number]   Call placed to member to follow up on ongoing management of chronic medical conditions, including post stroke and post ablation for A-fib.  He report he is doing well, was seen by PCP, no issues noted.  Member has remained independent, monitoring blood pressure and HR daily, denies any abnormal readings.  He confirmed he attended appointment with A-fib clinic on 8/27, follow up with cardiology on 9/14.  Also has follow up with neurology on 9/9.  Denies any urgent concerns at this time, will follow up within the next month.  Encouraged to contact this care manager with questions.  Goals Addressed              This Visit's Progress   .  COMPLETED: Patient Stated: to have this procedure and get better (pt-stated)        CARE PLAN ENTRY (see longitudinal plan of care for additional care plan information)  Current Barriers:  Marland Kitchen Knowledge Deficits related to ablation procedure  Nurse Case Manager Clinical Goal(s):  Marland Kitchen Over the next 28 days, patient will attend all scheduled medical appointments: cardiology and PCP  . Over the next 28 days, patient will demonstrate improved health management independence as evidenced bycompletion of ablation . Over the next 31 days, patient will verbalize basic understanding of A-fib disease process and self health management plan as evidenced by conversion of arrythmia  Interventions:  . Inter-disciplinary care team collaboration (see longitudinal plan of care) . Provided education to patient re: process of ablation . Reviewed medications with patient and discussed importance of taking a ordered . Discussed plans with patient for ongoing care management follow up and provided patient with direct contact information for care management team . Reviewed scheduled/upcoming provider appointments including:   Patient Self Care Activities:  . Attends all scheduled  provider appointments . Performs ADL's independently . Calls provider office for new concerns or questions  Initial goal documentation     .  Stay healthy and out of the hospital (pt-stated)        CARE PLAN ENTRY (see longitudinal plan of care for additional care plan information)  Current Barriers:  Marland Kitchen Knowledge Deficits related to management of chronic medical conditions  Nurse Case Manager Clinical Goal(s):  Marland Kitchen Over the next 28 days, patient will verbalize understanding of plan for managing chronic medical conditions (post stroke, A-fib) . Over the next 28 days, patient will attend all scheduled medical appointments: Neuro . Over the next 45 days, patient will demonstrate improved health management independence as evidenced bymaintaining independence and no readmissions  Interventions:  . Inter-disciplinary care team collaboration (see longitudinal plan of care) . Discussed plans with patient for ongoing care management follow up and provided patient with direct contact information for care management team . Reviewed scheduled/upcoming provider appointments including:   Patient Self Care Activities:  . Attends all scheduled provider appointments . Performs ADL's independently   Initial goal documentation       Kemper Durie, RN, MSN [facility] Care Management  [facility] Manager [phone]

## 2020-04-27 ENCOUNTER — Encounter: Payer: Self-pay | Admitting: Adult Health

## 2020-04-27 ENCOUNTER — Ambulatory Visit (INDEPENDENT_AMBULATORY_CARE_PROVIDER_SITE_OTHER): Payer: Medicare Other | Admitting: Adult Health

## 2020-04-27 ENCOUNTER — Other Ambulatory Visit: Payer: Self-pay

## 2020-04-27 VITALS — BP 120/60 | HR 68 | Ht 75.0 in | Wt 299.8 lb

## 2020-04-27 DIAGNOSIS — E11649 Type 2 diabetes mellitus with hypoglycemia without coma: Secondary | ICD-10-CM

## 2020-04-27 DIAGNOSIS — E782 Mixed hyperlipidemia: Secondary | ICD-10-CM | POA: Diagnosis not present

## 2020-04-27 DIAGNOSIS — I639 Cerebral infarction, unspecified: Secondary | ICD-10-CM | POA: Diagnosis not present

## 2020-04-27 DIAGNOSIS — I480 Paroxysmal atrial fibrillation: Secondary | ICD-10-CM | POA: Diagnosis not present

## 2020-04-27 DIAGNOSIS — Z794 Long term (current) use of insulin: Secondary | ICD-10-CM | POA: Diagnosis not present

## 2020-04-27 DIAGNOSIS — I10 Essential (primary) hypertension: Secondary | ICD-10-CM

## 2020-04-27 DIAGNOSIS — I6523 Occlusion and stenosis of bilateral carotid arteries: Secondary | ICD-10-CM | POA: Diagnosis not present

## 2020-04-27 DIAGNOSIS — I6381 Other cerebral infarction due to occlusion or stenosis of small artery: Secondary | ICD-10-CM

## 2020-04-27 NOTE — Patient Instructions (Addendum)
You are cleared to proceed with your cataract procedure from a stroke standpoint.  If your blood thinners need to be held for procedure, your ophthalmologist will send our office a clearance form which will be completed and sent back.  Continue aspirin 81 mg daily and Eliquis (apixaban) daily  and atorvastatin 80 mg daily for secondary stroke prevention  Continue to follow with cardiology for Eliquis management and atrial fibrillation monitoring Recommend routine monitoring of carotid stenosis which can be completed with cardiology  Continue to follow up with PCP regarding cholesterol, and blood pressure and endocrinology for diabetic management  Maintain strict control of hypertension with blood pressure goal below 130/90, diabetes with hemoglobin A1c goal below 7.0% and cholesterol with LDL cholesterol (bad cholesterol) goal below 70 mg/dL.    Follow-up in 6 months or call earlier if needed     Thank you for coming to see [place] at [facility]. I hope we have been able to provide you high quality care today.  You may receive a patient satisfaction survey over the next few weeks. We would appreciate your feedback and comments so that we may continue to improve ourselves and the health of our patients.    Stroke Prevention Some medical conditions and behaviors are associated with a higher chance of having a stroke. You can help prevent a stroke by making nutrition, lifestyle, and other changes, including managing any medical conditions you may have. What nutrition changes can be made?   Eat healthy foods. You can do this by: ? Choosing foods high in fiber, such as fresh fruits and vegetables and whole grains. ? Eating at least 5 or more servings of fruits and vegetables a day. Try to fill half of your plate at each meal with fruits and vegetables. ? Choosing lean protein foods, such as lean cuts of meat, poultry without skin, fish, tofu, beans, and nuts. ? Eating low-fat  dairy products. ? Avoiding foods that are high in salt (sodium). This can help lower blood pressure. ? Avoiding foods that have saturated fat, trans fat, and cholesterol. This can help prevent high cholesterol. ? Avoiding processed and premade foods.  Follow your health care provider's specific guidelines for losing weight, controlling high blood pressure (hypertension), lowering high cholesterol, and managing diabetes. These may include: ? Reducing your daily calorie intake. ? Limiting your daily sodium intake to 1,500 milligrams (mg). ? Using only healthy fats for cooking, such as olive oil, canola oil, or sunflower oil. ? Counting your daily carbohydrate intake. What lifestyle changes can be made?  Maintain a healthy weight. Talk to your health care provider about your ideal weight.  Get at least 30 minutes of moderate physical activity at least 5 days a week. Moderate activity includes brisk walking, biking, and swimming.  Do not use any products that contain nicotine or tobacco, such as cigarettes and e-cigarettes. If you need help quitting, ask your health care provider. It may also be helpful to avoid exposure to secondhand smoke.  Limit alcohol intake to no more than 1 drink a day for nonpregnant women and 2 drinks a day for men. One drink equals 12 oz of beer, 5 oz of wine, or 1 oz of hard liquor.  Stop any illegal drug use.  Avoid taking birth control pills. Talk to your health care provider about the risks of taking birth control pills if: ? You are over 31 years old. ? You smoke. ? You get migraines. ? You have ever had a  blood clot. What other changes can be made?  Manage your cholesterol levels. ? Eating a healthy diet is important for preventing high cholesterol. If cholesterol cannot be managed through diet alone, you may also need to take medicines. ? Take any prescribed medicines to control your cholesterol as told by your health care provider.  Manage your  diabetes. ? Eating a healthy diet and exercising regularly are important parts of managing your blood sugar. If your blood sugar cannot be managed through diet and exercise, you may need to take medicines. ? Take any prescribed medicines to control your diabetes as told by your health care provider.  Control your hypertension. ? To reduce your risk of stroke, try to keep your blood pressure below 130/80. ? Eating a healthy diet and exercising regularly are an important part of controlling your blood pressure. If your blood pressure cannot be managed through diet and exercise, you may need to take medicines. ? Take any prescribed medicines to control hypertension as told by your health care provider. ? Ask your health care provider if you should monitor your blood pressure at home. ? Have your blood pressure checked every year, even if your blood pressure is normal. Blood pressure increases with age and some medical conditions.  Get evaluated for sleep disorders (sleep apnea). Talk to your health care provider about getting a sleep evaluation if you snore a lot or have excessive sleepiness.  Take over-the-counter and prescription medicines only as told by your health care provider. Aspirin or blood thinners (antiplatelets or anticoagulants) may be recommended to reduce your risk of forming blood clots that can lead to stroke.  Make sure that any other medical conditions you have, such as atrial fibrillation or atherosclerosis, are managed. What are the warning signs of a stroke? The warning signs of a stroke can be easily remembered as BEFAST.  B is for balance. Signs include: ? Dizziness. ? Loss of balance or coordination. ? Sudden trouble walking.  E is for eyes. Signs include: ? A sudden change in vision. ? Trouble seeing.  F is for face. Signs include: ? Sudden weakness or numbness of the face. ? The face or eyelid drooping to one side.  A is for arms. Signs include: ? Sudden  weakness or numbness of the arm, usually on one side of the body.  S is for speech. Signs include: ? Trouble speaking (aphasia). ? Trouble understanding.  T is for time. ? These symptoms may represent a serious problem that is an emergency. Do not wait to see if the symptoms will go away. Get medical help right away. Call your local emergency services (911 in the [place] Do not drive yourself to the hospital.  Other signs of stroke may include: ? A sudden, severe headache with no known cause. ? Nausea or vomiting. ? Seizure. Where to find more information For more information, visit:  American Stroke Association: www.strokeassociation.org  National Stroke Association: www.stroke.org Summary  You can prevent a stroke by eating healthy, exercising, not smoking, limiting alcohol intake, and managing any medical conditions you may have.  Do not use any products that contain nicotine or tobacco, such as cigarettes and e-cigarettes. If you need help quitting, ask your health care provider. It may also be helpful to avoid exposure to secondhand smoke.  Remember BEFAST for warning signs of stroke. Get help right away if you or a loved one has any of these signs. This information is not intended to replace advice given to you  by your health care provider. Make sure you discuss any questions you have with your health care provider. Document Revised: 07/18/2017 Document Reviewed: 09/10/2016 Elsevier Patient Education  2020 [employer].

## 2020-04-27 NOTE — Progress Notes (Signed)
[facility] [address] [place]. [place] [address] [phone]       STROKE FOLLOW UP NOTE  Brandon Sanchez Date of Birth:  [birth year] Medical Record Number:  [id number]   Reason for Referral: stroke follow up    SUBJECTIVE:   CHIEF COMPLAINT:  Chief Complaint  Patient presents with  . Follow-up    3 mo stroke f/u; doing well without concerns  . room 5    alone    HPI:   Today, 04/27/2020, Brandon Sanchez returns for stroke follow-up  Doing well from a stroke standpoint without residual deficits and denies new or reoccurring stroke/TIA symptoms  Remains on aspirin 81 mg daily and Eliquis without bleeding or bruising for secondary stroke prevention and atrial fibrillation Remains on atorvastatin 80 mg daily without myalgias Blood pressure today 120/60 Glucose levels avg 130-140 per patient  He is requesting clearance to pursue cataract surgery  No further concerns at this time     History provided for reference purposes only Initial visit 01/25/2020 JM: Brandon Sanchez is being seen for hospital follow-up.  He is accompanied by his daughter via telephone during visit.  He was discharged from CIR to home with outpatient therapies on 12/17/2019.  He has recovered well from a stroke standpoint with complete resolution and return to baseline.  Discharged from outpatient PT on 01/18/2020 due to meeting all goals.  He has continued on aspirin 81 mg daily and Eliquis without bleeding or bruising.  Continues on atorvastatin 80 mg daily without myalgias.  Blood pressure today 109/66 -monitors at home routinely and currently has been ranging 110-120/70-80s.  He was restarted on Lasix due to lower extremity edema with blood pressure ranging 130-150 prior to restarting.  Glucose levels stable and continues to follow with endocrinology for monitoring and management.  Continues to follow closely with PCP, endocrinology and cardiology. He did undergo sleep study prior to his  stroke on 11/24/2019 which was negative for sleep apnea.  No concerns at this time.  Stroke admission 12/03/2019 Brandon Sanchez is a 74 y.o. male with history of atrial fibrillation and is on Eliquis, asthma, Htn and DM  who presented on 12/03/2019 with acute onset of left hemiplegia, left facial droop and dysarthria.   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 fib could be potential etiology but he is on Eliquis and stroke location not typical for A. fib related infarct.  CTA showed atherosclerotic changes in the distal common carotid arteries at the carotid bifurcations bilaterally significant stenosis.  Continuation of Eliquis for atrial fibrillation with addition of aspirin 81 mg daily for secondary stroke prevention.  History of HTN with hypotension during admission with fluctuation of neuro symptoms and discontinue metoprolol and lowered furosemide dosage.  History of HLD on atorvastatin 40 mg daily with LDL 52 and increase atorvastatin dosage to 80 mg daily.  DM with A1c 7.6.  Other stroke risk factors include advanced age, prior EtOH use and obesity but no prior stroke history.  Residual deficits of mild left facial droop and left hemiparesis.  Evaluated by therapies who recommended discharge to CIR for functional decline.  Stroke: right BG/CR small scattered infarcts, likely due to right CCA mixed plaque. Although afib could be potential etiology, but he is on eliquis and stroke location not typical for afib related infarct.   CT Head - No acute intracranial abnormality.   CTA H&N - No emergent large  vessel occlusion. Atherosclerotic changes in the distal common carotid arteries and at the carotid bifurcations bilaterally significant stenosis.   MRI head right BG/CR small infarcts  MRA head - normal  2D Echo EF 55-60%  [employer] Corona Virus 2 - negative  LDL - 52  HgbA1c - 7.6  VTE prophylaxis - Eliquis  Eliquis  (apixaban) daily prior to admission, now on aspirin 81 mg daily and Eliquis (apixaban) daily. Continue on discharge  Patient counseled to be compliant with his antithrombotic medications  Ongoing aggressive stroke risk factor management  Therapy recommendations: CIR  Disposition:  CIR     ROS:   14 system review of systems performed and negative with exception of no complaints  PMH:  Past Medical History:  Diagnosis Date  . Allergic rhinitis, cause unspecified   . Asthma   . Cataract cortical, senile, bilateral   . Macular degeneration of left eye   . Other and unspecified hyperlipidemia   . Type II or unspecified type diabetes mellitus without mention of complication, not stated as uncontrolled   . Unspecified essential hypertension     PSH:  Past Surgical History:  Procedure Laterality Date  . ATRIAL FIBRILLATION ABLATION N/A 03/17/2020   Procedure: ATRIAL FIBRILLATION ABLATION;  Surgeon: Regan Lemmingamnitz, Will Martin, MD;  Location: [facility] INVASIVE CV LAB;  Service: Cardiovascular;  Laterality: N/A;  . CARDIOVERSION N/A 09/22/2019   Procedure: CARDIOVERSION;  Surgeon: Little IshikawaSchumann, Christopher L, MD;  [facility];  Service: Endoscopy;  Laterality: N/A;  . CARDIOVERSION N/A 10/18/2019   Procedure: CARDIOVERSION;  Surgeon: Jake BatheSkains, Mark C, MD;  [facility];  Service: Cardiovascular;  Laterality: N/A;  . TEE WITHOUT CARDIOVERSION N/A 09/22/2019   Procedure: TRANSESOPHAGEAL ECHOCARDIOGRAM (TEE);  Surgeon: Little IshikawaSchumann, Christopher L, MD;  [facility];  Service: Endoscopy;  Laterality: N/A;    Social History:  Social History   Socioeconomic History  . Marital status: Divorced    Spouse name: Not on file  . Number of children: Not on file  . Years of education: Not on file  . Highest education level: Professional school degree (e.g., MD, DDS, DVM, JD)  Occupational History  . 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 Smoking status: Never Smoker  . Smokeless  tobacco: Never Used  Substance and Sexual Activity  . Alcohol use: Not Currently    Comment: Used to drink 2 L bottle of wine/day. Quit 09/18/19  . Drug use: Never  . Sexual activity: Not Currently  Other Topics Concern  . Not on file  Social History Narrative  . Not on file   Social Determinants of Health   Financial Resource Strain:   . Difficulty of Paying Living Expenses: Not on file  Food Insecurity: No Food Insecurity  . Worried About Programme researcher, broadcasting/film/videounning Out of Food in the Last Year: Never true  . Ran Out of Food in the Last Year: Never true  Transportation Needs: No Transportation Needs  . Lack of Transportation (Medical): No  . Lack of Transportation (Non-Medical): No  Physical Activity:   . Days of Exercise per Week: Not on file  . Minutes of Exercise per Session: Not on file  Stress:   . Feeling of Stress : Not on file  Social Connections:   . Frequency of Communication with Friends and Family: Not on file  . Frequency of Social Gatherings with Friends and Family: Not on file  . Attends Religious Services: Not on file  . Active Member of Clubs or Organizations: Not on file  .  Attends Banker Meetings: Not on file  . Marital Status: Not on file  Intimate Partner Violence:   . Fear of Current or Ex-Partner: Not on file  . Emotionally Abused: Not on file  . Physically Abused: Not on file  . Sexually Abused: Not on file    Family History:  Family History  Problem Relation Age of Onset  . Colon cancer Father   . Heart disease Paternal Grandfather     Medications:   Current Outpatient Medications on File Prior to Visit  Medication Sig Dispense Refill  . albuterol (PROAIR HFA) 108 (90 BASE) MCG/ACT inhaler 2 puffs every 4 hours as needed only  if your can't catch your breath (Patient taking differently: Inhale 2 puffs into the lungs every 4 (four) hours as needed for wheezing. ) 1 Inhaler 1  . amiodarone (PACERONE) 200 MG tablet Take 1 tablet (200 mg total) by  mouth daily. 90 tablet 2  . apixaban (ELIQUIS) 5 MG TABS tablet Take 1 tablet (5 mg total) by mouth 2 (two) times daily. 60 tablet 6  . aspirin EC 81 MG EC tablet Take 1 tablet (81 mg total) by mouth daily. 60 tablet 0  . atorvastatin (LIPITOR) 80 MG tablet Take 1 tablet (80 mg total) by mouth every evening. 30 tablet 0  . B-D ULTRAFINE III SHORT PEN 31G X 8 MM MISC SMARTSIG:Pre-Filled Pen Syringe SUB-Q 3 Times Daily    . BREO ELLIPTA 200-25 MCG/INH AEPB Inhale 1 puff into the lungs daily as needed (shortness of breath).     . cyclobenzaprine (FLEXERIL) 10 MG tablet Take 10 mg by mouth as needed. For back pain    . furosemide (LASIX) 40 MG tablet Take 1 tablet (40 mg total) by mouth daily. 90 tablet 3  . insulin aspart protamine- aspart (NOVOLOG MIX 70/30) (70-30) 100 UNIT/ML injection Inject 0.16 mLs (16 Units total) into the skin 2 (two) times daily at 8 am and 10 pm. Dose varies - Prescription is for 20 units with each meal (twice daily). However administration depends on what types of meals being taken. (Patient taking differently: Inject 20 Units into the skin 2 (two) times daily at 8 am and 10 pm. Dose varies - Prescription is for 20 units with each meal (twice daily). However administration depends on what types of meals being taken.) 10 mL 0  . Multiple Vitamin (MULTIVITAMIN WITH MINERALS) TABS tablet Take 1 tablet by mouth daily.    Marland Kitchen MYRBETRIQ 25 MG TB24 tablet Take 25 mg by mouth daily.    Marland Kitchen omeprazole (PRILOSEC) 40 MG capsule Take 40 mg by mouth daily as needed (heart burn).     Letta Pate VERIO test strip 1 each by Other route in the morning, at noon, and at bedtime.     . polyethylene glycol (MIRALAX / GLYCOLAX) 17 g packet Take 17 g by mouth daily. 14 each 0  . PREVIDENT 5000 BOOSTER PLUS 1.1 % PSTE Place 1 application onto teeth in the morning and at bedtime.     . sertraline (ZOLOFT) 100 MG tablet Take 100 mg by mouth daily.     No current facility-administered medications on file  prior to visit.    Allergies:  No Known Allergies    OBJECTIVE:  Physical Exam  Vitals:   04/27/20 0856  BP: 120/60  Pulse: 68  Weight: 299 lb 12.8 oz (136 kg)  Height: 6\' 3"  (1.905 m)   Body mass index is 37.47 kg/m. No  exam data present   General: well developed, well nourished, pleasant elderly Caucasian male, seated, in no evident distress Head: head normocephalic and atraumatic.   Neck: supple with no carotid or supraclavicular bruits Cardiovascular: regular rate and rhythm, no murmurs Musculoskeletal: no deformity Skin:  no rash/petichiae Vascular:  Normal pulses all extremities; 2+ edema BLE L>R (chronic)   Neurologic Exam Mental Status: Awake and fully alert. Fluent speech and language.  Oriented to place and time. Recent and remote memory intact. Attention span, concentration and fund of knowledge appropriate. Mood and affect appropriate.  Cranial Nerves: Pupils equal, briskly reactive to light. Extraocular movements full without nystagmus. Visual fields full to confrontation. Hearing intact. Facial sensation intact. Face, tongue, palate moves normally and symmetrically.  Motor: Normal bulk and tone. Normal strength in all tested extremity muscles. Sensory.: intact to touch , pinprick , position and vibratory sensation.  Coordination: Rapid alternating movements normal in all extremities. Finger-to-nose and heel-to-shin performed accurately bilaterally. Gait and Station: Arises from chair without difficulty. Stance is normal. Gait demonstrates  mild gait impairment secondary to bilateral chronic knee pain but able to ambulate without assistive device Reflexes: 1+ and symmetric. Toes downgoing.       ASSESSMENT: MAVEN Brandon Sanchez is a 74 y.o. year old male presented with acute onset of ophthalmoplegia, left facial droop and dysarthria on 12/03/2019 with stroke work-up revealing right BG/CR small scattered infarcts likely secondary to right CCA mixed plaque.  Noted  that A. fib could be potential etiology but he is on Eliquis and stroke formation notable for A. fib related infarct.  Recommended adding aspirin in addition to Eliquis at discharge.  Vascular risk factors include PAF on Eliquis, CCA stenosis, HTN, HLD and DM.  Evaluated for sleep apnea which was negative.     PLAN:  1. Right BG/CR stroke:  a. Recovered well without residual deficits b. Advised patient he is cleared to pursue cataract surgery from a stroke standpoint.  He was advised that if blood thinners need to be held prior to procedure, ophthalmologist will fax a clearance request.  He will also need clearance by cardiology.  Advised if blood thinners are to be held, there will be a small but acceptable preprocedure risk of recurrent stroke off therapy and will be recommended to restart immediately after procedure once hemodynamically stable c. Continue aspirin 81 mg daily and Eliquis (apixaban) daily  and atorvastatin 80 mg daily for secondary stroke prevention.  d. Discussed importance of close PCP follow-up for aggressive stroke risk factor management 2. Atrial fibrillation:  a. Successful atrial fibrillation ablation 03/17/2020 b. Continuation of Eliquis managed by cardiology 3. Carotid artery disease:  a. Continuation of aspirin and increased dose of statin along with importance of managing HTN, HLD and DM.  Also discussed importance of avoiding hypotension.   b. Routine surveillance monitoring with carotid ultrasound can be completed by cardiology -advised patient if cardiologist unable to monitor, to call office so order can be placed 4. HTN:  a. BP goal<130/90.   b. Stable today. c. Managed by cardiology 5. HLD:  a. LDL goal<70.   b. Reports recent lab work by PCP which was satisfactory c. Advised to continue atorvastatin 80 mg daily.   d. Managed and monitored by PCP 6. DMII:  a. A1c goal<7.   b. Stable with average BG 130s per patient c. Managed by  endocrinology    Follow-up in 6 months or call earlier if needed   I spent 30 minutes of face-to-face and  non-face-to-face time with patient.  This included previsit chart review, lab review, study review, order entry, electronic health record documentation, patient education regarding prior stroke, importance of managing stroke risk factors, clearance information and answered all other questions to patient satisfaction    Ihor Austin, [facility]  [facility] [address] [place], [place] [address]  Phone [phone] Fax [phone] Note: This document was prepared with digital dictation and possible smart phrase technology. Any transcriptional errors that result from this process are unintentional.

## 2020-04-27 NOTE — Progress Notes (Signed)
I agree with the above plan 

## 2020-05-02 ENCOUNTER — Other Ambulatory Visit: Payer: Self-pay

## 2020-05-02 ENCOUNTER — Ambulatory Visit (INDEPENDENT_AMBULATORY_CARE_PROVIDER_SITE_OTHER): Payer: Medicare Other | Admitting: Cardiology

## 2020-05-02 ENCOUNTER — Encounter: Payer: Self-pay | Admitting: Cardiology

## 2020-05-02 VITALS — BP 130/68 | HR 88 | Temp 97.3°F | Ht 75.0 in | Wt 296.0 lb

## 2020-05-02 DIAGNOSIS — Z8673 Personal history of transient ischemic attack (TIA), and cerebral infarction without residual deficits: Secondary | ICD-10-CM

## 2020-05-02 DIAGNOSIS — R600 Localized edema: Secondary | ICD-10-CM

## 2020-05-02 DIAGNOSIS — Z7189 Other specified counseling: Secondary | ICD-10-CM | POA: Diagnosis not present

## 2020-05-02 DIAGNOSIS — I6523 Occlusion and stenosis of bilateral carotid arteries: Secondary | ICD-10-CM | POA: Diagnosis not present

## 2020-05-02 DIAGNOSIS — I480 Paroxysmal atrial fibrillation: Secondary | ICD-10-CM

## 2020-05-02 NOTE — Patient Instructions (Signed)
Medication Instructions:  Your Physician recommend you continue on your current medication as directed.    *If you need a refill on your cardiac medications before your next appointment, please call your pharmacy*   Lab Work: None ordered   Testing/Procedures: None ordered    Follow-Up: At [facility], you and your health needs are our priority.  As part of our continuing mission to provide you with exceptional heart care, we have created designated Provider Care Teams.  These Care Teams include your primary Cardiologist (physician) and Advanced Practice Providers (APPs -  Physician Assistants and Nurse Practitioners) who all work together to provide you with the care you need, when you need it.  We recommend signing up for the patient portal called "MyChart".  Sign up information is provided on this After Visit Summary.  MyChart is used to connect with patients for Virtual Visits (Telemedicine).  Patients are able to view lab/test results, encounter notes, upcoming appointments, etc.  Non-urgent messages can be sent to your provider as well.   To learn more about what you can do with MyChart, go to [email or web address].    Your next appointment:   5-6 month(s)  The format for your next appointment:   In Person  Provider:   Jodelle Red, MD

## 2020-05-02 NOTE — Progress Notes (Signed)
Cardiology Office Note:    Date:  05/02/2020   ID:  Brandon Sanchez, DOB 05/05/1946, MRN [id number]  PCP:  Merri Brunette, MD  Cardiologist:  Jodelle Red, MD  EP: Dr. Elberta Fortis  Referring MD: Merri Brunette, MD   CC: follow up  History of Present Illness:    Brandon Sanchez is a 74 y.o. male with a hx of asthma, type II diabetes, hypertension, hyperlipidemia, atrial fibrillation who is seen for follow up.   Cardiac history: admitted 09/19/19 with atrial fibrillation with RVR. Has known history of type II diabetes, hypertension, and hyperlipidemia. He was also treated for community acquired pneumonia during that admission. He was started on anticoagulation, and rate control was attempted. Course was complicated by hypotension. He underwent TEE-CV with initial return to sinus rhythm. However after discharge, he returned to afib with RVR.   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 Hospital notes reviewed. His presenting symptoms were acute left sided weakness and facial droop. He was outside the tPA window. He was continued on apixaban and atorvastatin (increased from 40 mg to 80 mg), with aspirin added. He completed an additional week of inpatient rehab prior to returning home.  Today: Last seen by afib clinic 04/14/20; s/p afib ablation by Dr. Elberta Fortis on 03/17/20.  Daughter included via speakerphone today.  Doing well post ablation, no issues. Blood pressure has been a bit higher this week, highest 138 systolic, lowest 662 systolic. HR has been 60s-70s, has been steady.   Has questions today: what if he needs another ablation, how will he know/how to monitor, when to stop amiodarone. We reviewed these in general, and I recommended he discuss further with Dr. Elberta Fortis in November.  Told at recent neurology visit that the cardiologist would follow his carotid arteries; discussed the indications for rechecking this in the future.  Pending  cataract surgery in the future; we discussed that this does not require cardiac clearance given low risk surgery. At this time, if anticoagulation would need to be held, more concern would be for recent stroke history than afib history. He plans to meet with his opthalmologist to see if he needs to hold anticoagulation and if so, for how long. Neurology has requested that a clearance form be sent to them, and they noted that he is at small but acceptable risk if anticoagulation needs to be held.   Denies chest pain, shortness of breath at rest or with normal exertion. No PND, orthopnea, LE edema or unexpected weight gain. No syncope or palpitations. Taking furosemide once daily.  No hematuria, no melena/hematochezia.   Past Medical History:  Diagnosis Date  . Allergic rhinitis, cause unspecified   . Asthma   . Cataract cortical, senile, bilateral   . Macular degeneration of left eye   . Other and unspecified hyperlipidemia   . Type II or unspecified type diabetes mellitus without mention of complication, not stated as uncontrolled   . Unspecified essential hypertension     Past Surgical History:  Procedure Laterality Date  . ATRIAL FIBRILLATION ABLATION N/A 03/17/2020   Procedure: ATRIAL FIBRILLATION ABLATION;  Surgeon: Regan Lemming, MD;  Location: [facility] INVASIVE CV LAB;  Service: Cardiovascular;  Laterality: N/A;  . CARDIOVERSION N/A 09/22/2019   Procedure: CARDIOVERSION;  Surgeon: [name], MD;  Location: [facility] ENDOSCOPY;  Service: Endoscopy;  Laterality: N/A;  . CARDIOVERSION N/A 10/18/2019   Procedure: CARDIOVERSION;  Surgeon: Jake Bathe, MD;  Location: [facility] ENDOSCOPY;  Service: Cardiovascular;  Laterality:  N/A;  . TEE WITHOUT CARDIOVERSION N/A 09/22/2019   Procedure: TRANSESOPHAGEAL ECHOCARDIOGRAM (TEE);  Surgeon: Little IshikawaSchumann, [place] Lebeau L, MD;  [facility];  Service: Endoscopy;  Laterality: N/A;    Current Medications: Current Outpatient Medications on File  Prior to Visit  Medication Sig  . albuterol (PROAIR HFA) 108 (90 BASE) MCG/ACT inhaler 2 puffs every 4 hours as needed only  if your can't catch your breath (Patient taking differently: Inhale 2 puffs into the lungs every 4 (four) hours as needed for wheezing. )  . amiodarone (PACERONE) 200 MG tablet Take 1 tablet (200 mg total) by mouth daily.  Marland Kitchen. apixaban (ELIQUIS) 5 MG TABS tablet Take 1 tablet (5 mg total) by mouth 2 (two) times daily.  Marland Kitchen. aspirin EC 81 MG EC tablet Take 1 tablet (81 mg total) by mouth daily.  Marland Kitchen. atorvastatin (LIPITOR) 80 MG tablet Take 1 tablet (80 mg total) by mouth every evening.  . B-D ULTRAFINE III SHORT PEN 31G X 8 MM MISC SMARTSIG:Pre-Filled Pen Syringe SUB-Q 3 Times Daily  . BREO ELLIPTA 200-25 MCG/INH AEPB Inhale 1 puff into the lungs daily as needed (shortness of breath).   . cyclobenzaprine (FLEXERIL) 10 MG tablet Take 10 mg by mouth as needed. For back pain  . furosemide (LASIX) 40 MG tablet Take 1 tablet (40 mg total) by mouth daily.  . insulin aspart protamine- aspart (NOVOLOG MIX 70/30) (70-30) 100 UNIT/ML injection Inject 0.16 mLs (16 Units total) into the skin 2 (two) times daily at 8 am and 10 pm. Dose varies - Prescription is for 20 units with each meal (twice daily). However administration depends on what types of meals being taken. (Patient taking differently: Inject 20 Units into the skin 2 (two) times daily at 8 am and 10 pm. Dose varies - Prescription is for 20 units with each meal (twice daily). However administration depends on what types of meals being taken.)  . Multiple Vitamin (MULTIVITAMIN WITH MINERALS) TABS tablet Take 1 tablet by mouth daily.  Marland Kitchen. MYRBETRIQ 25 MG TB24 tablet Take 25 mg by mouth daily.  Marland Kitchen. omeprazole (PRILOSEC) 40 MG capsule Take 40 mg by mouth daily as needed (heart burn).   Letta Pate. ONETOUCH VERIO test strip 1 each by Other route in the morning, at noon, and at bedtime.   . polyethylene glycol (MIRALAX / GLYCOLAX) 17 g packet Take 17 g by  mouth daily.  Marland Kitchen. PREVIDENT 5000 BOOSTER PLUS 1.1 % PSTE Place 1 application onto teeth in the morning and at bedtime.   . sertraline (ZOLOFT) 100 MG tablet Take 100 mg by mouth daily.   No current facility-administered medications on file prior to visit.     Allergies:   Patient has no known allergies.   Social History   Tobacco Use  . Smoking status: Never Smoker  . Smokeless tobacco: Never Used  Substance Use Topics  . Alcohol use: Not Currently    Comment: Used to drink 2 L bottle of wine/day. Quit 09/18/19  . Drug use: Never    Family History: family history includes Colon cancer in his father; Heart disease in his paternal grandfather.  ROS:   Please see the history of present illness.  Additional pertinent ROS negative except as noted in HPI   EKGs/Labs/Other Studies Reviewed:    The following studies were reviewed today: Echo 12/04/19 1. Left ventricular ejection fraction, by estimation, is 55 to 60%. The  left ventricle has normal function. The left ventricle has no regional  wall  motion abnormalities. The left ventricular internal cavity size was  mildly dilated. There is mild  concentric left ventricular hypertrophy. Left ventricular diastolic  parameters are consistent with Grade III diastolic dysfunction  (restrictive). Elevated left ventricular end-diastolic pressure.  2. Right ventricular systolic function is normal. The right ventricular  size is mildly enlarged.  3. Left atrial size was mildly dilated.  4. Right atrial size was mild to moderately dilated.  5. The mitral valve is grossly normal. Trivial mitral valve  regurgitation. No evidence of mitral stenosis.  6. The aortic valve is tricuspid. Aortic valve regurgitation is mild to  moderate. Moderate aortic valve stenosis.  7. The inferior vena cava is dilated in size with >50% respiratory  variability, suggesting right atrial pressure of 8 mmHg.   Echo 09/20/19 1. Left ventricular ejection  fraction, by visual estimation, is 50 to  55%. The left ventricle has normal function. There is mildly increased  left ventricular hypertrophy.  2. Definity contrast agent was given IV to delineate the left ventricular  endocardial borders.  3. Left ventricular diastolic function could not be evaluated.  4. Mildly dilated left ventricular internal cavity size.  5. The left ventricle has no regional wall motion abnormalities.  6. Global right ventricle has normal systolic function.The right  ventricular size is mildly enlarged.  7. Left atrial size was mildly dilated.  8. Right atrial size was mildly dilated.  9. Mild mitral annular calcification.  10. Trivial mitral valve regurgitation. No evidence of mitral stenosis.  11. The tricuspid valve is normal in structure. Tricuspid valve  regurgitation is trivial.  12. The aortic valve was not well visualized. Aortic valve regurgitation  is not visualized. Mild to moderate aortic valve stenosis.  13. The pulmonic valve was not well visualized. Pulmonic valve  regurgitation is not visualized.  14. The inferior vena cava is dilated in size with <50% respiratory  variability, suggesting right atrial pressure of 15 mmHg.  15. Technically difficult; definity used; low normal LV systolic function;  mild LVH; mild LVE; calcified aortic valve with mild to moderate AS (mean  gradient 19 mmHg); mild LAE/RAE/RVE.   EKG:  EKG is personally reviewed.  The ekg ordered today demonstrates NSR at 88 bpm, LAFB, IVCD  Recent Labs: 09/20/2019: B Natriuretic Peptide 289.1 09/28/2019: TSH 2.357 12/06/2019: Magnesium 2.1 12/11/2019: ALT 16 03/06/2020: BUN 32; Creatinine, Ser 1.27; Potassium 4.4; Sodium 141 03/10/2020: Hemoglobin 12.8; Platelets 177  Recent Lipid Panel    Component Value Date/Time   CHOL 96 12/04/2019 0503   TRIG 59 12/04/2019 0503   HDL 32 (L) 12/04/2019 0503   CHOLHDL 3.0 12/04/2019 0503   VLDL 12 12/04/2019 0503   LDLCALC 52  12/04/2019 0503    Physical Exam:    VS:  BP 130/68   Pulse 88   Temp (!) 97.3 F (36.3 C)   Ht 6\' 3"  (1.905 m)   Wt 296 lb (134.3 kg)   SpO2 93%   BMI 37.00 kg/m     Wt Readings from Last 3 Encounters:  05/02/20 296 lb (134.3 kg)  04/27/20 299 lb 12.8 oz (136 kg)  04/14/20 293 lb 12.8 oz (133.3 kg)    GEN: Well nourished, well developed in no acute distress HEENT: Normal, moist mucous membranes NECK: No JVD CARDIAC: regular rhythm, normal S1 and S2, no rubs or gallops. 2/6 systolic murmur. VASCULAR: Radial and DP pulses 2+ bilaterally. No carotid bruits RESPIRATORY:  Clear to auscultation without rales, wheezing or rhonchi  ABDOMEN:  Soft, non-tender, non-distended MUSCULOSKELETAL:  Ambulates independently SKIN: Warm and dry, bilateral trace ankle edema NEUROLOGIC:  Alert and oriented x 3. No focal neuro deficits noted. Bilateral hand tremor PSYCHIATRIC:  Normal affect   ASSESSMENT:    1. Paroxysmal atrial fibrillation (HCC)   2. History of CVA (cerebrovascular accident)   3. Bilateral leg edema   4. Cardiac risk counseling   5. Counseling on health promotion and disease prevention    PLAN:    Paroxysmal atrial fibrillation with RVR:  -In NSR today. -on apixaban for anticoagulation -s/p ablation by Dr. Elberta Fortis. On amiodarone. Has upcoming follow up with Dr. Elberta Fortis to discuss timing of stopping amiodarone -instructed on red flag warning signs that need immediate medical attention  History of CVA: -on aspirin, apixaban, and atorvastatin  LE edema:  -only trace swelling today  Possible OSA: sleep study didn't meet criteria for CPAP. Recommended to avoid laying supine.  CV risk counseling and prevention: -recommend heart healthy/Mediterranean diet, with whole grains, fruits, vegetable, fish, lean meats, nuts, and olive oil. Limit salt. -recommend moderate walking, 3-5 times/week for 30-50 minutes each session. Aim for at least 150 minutes.week. Goal should be  pace of 3 miles/hours, or walking 1.5 miles in 30 minutes -recommend avoidance of tobacco products. Avoid excess alcohol.  Plan for follow up: 5-6 months  Jodelle Red, MD, PhD [place]  [facility]   Medication Adjustments/Labs and Tests Ordered: Current medicines are reviewed at length with the patient today.  Concerns regarding medicines are outlined above.  Orders Placed This Encounter  Procedures  . EKG 12-Lead   No orders of the defined types were placed in this encounter.   Patient Instructions  Medication Instructions:  Your Physician recommend you continue on your current medication as directed.    *If you need a refill on your cardiac medications before your next appointment, please call your pharmacy*   Lab Work: None ordered   Testing/Procedures: None ordered    Follow-Up: At [facility], you and your health needs are our priority.  As part of our continuing mission to provide you with exceptional heart care, we have created designated Provider Care Teams.  These Care Teams include your primary Cardiologist (physician) and Advanced Practice Providers (APPs -  Physician Assistants and Nurse Practitioners) who all work together to provide you with the care you need, when you need it.  We recommend signing up for the patient portal called "MyChart".  Sign up information is provided on this After Visit Summary.  MyChart is used to connect with patients for Virtual Visits (Telemedicine).  Patients are able to view lab/test results, encounter notes, upcoming appointments, etc.  Non-urgent messages can be sent to your provider as well.   To learn more about what you can do with MyChart, go to [email or web address].    Your next appointment:   5-6 month(s)  The format for your next appointment:   In Person  Provider:   Jodelle Red, MD     Signed, Jodelle Red, MD PhD 05/02/2020   [facility]

## 2020-05-04 ENCOUNTER — Encounter: Payer: Self-pay | Admitting: Cardiology

## 2020-05-19 ENCOUNTER — Other Ambulatory Visit: Payer: Self-pay | Admitting: *Deleted

## 2020-05-19 NOTE — Patient Outreach (Signed)
[facility] [address] [facility]) Care Management  05/19/2020  Brandon Sanchez [birth year] [id number]   Call placed to member to follow up on management of A-fib.  Was seen in the A-fib clinic on 9/14, will follow up in November for medication review.  Was also seen on 9/9 by neurologist, next visit in 6 months.  He also has appointment with PCP in November, with endocrinologist on 10/5.  Report his heart rate has remained stable, blood sugars 150-180's.  Denies any urgent concerns, planning to have cataract surgery in the near future.  Encouraged to contact this care manager with questions, will follow up within the next month.  Goals Addressed            This Visit's Progress   . [facility] - Keep or Improve My Strength       Follow Up Date 06/21/20   - eat healthy to increase strength - increase activity or exercise time a little every week    Why is this important?   Before the stroke you probably did not think much about being safe when you are up and about.  Now, it may be harder for you to get around.  It may also be easier for you to trip or fall.  It is common to have muscle weakness after a stroke. You may also feel like you cannot control an arm or leg.  It will be helpful to work with a physical therapist to get your strength and muscle control back.  It is good to stay as active as you can. Walking and stretching help you stay strong and flexible.  The physical therapist will develop an exercise program just for you.     Notes:     . [facility] - Make and Keep All Appointments       Follow Up Date 06/21/20   - ask family or friend for a ride - call to cancel if needed - keep a calendar with appointment dates    Why is this important?   Part of staying healthy is seeing the doctor for follow-up care.  If you forget your appointments, there are some things you can do to stay on track.    Notes:     . [facility] - Track and Manage Heart Rate and Rhythm       Follow Up Date 06/21/20   -  check pulse (heart) rate before taking medicine - check pulse (heart) rate once a day - keep all lab appointments    Why is this important?   Atrial fibrillation may have no symptoms. Sometimes the symptoms get worse or happen more often.  It is important to keep track of what your symptoms are and when they happen.  A change in symptoms is important to discuss with your doctor or nurse.  Being active and healthy eating will also help you manage your heart condition.     Notes:       Kemper Durie, RN, MSN [facility] Care Management  [facility] Manager [phone]

## 2020-05-23 DIAGNOSIS — Z23 Encounter for immunization: Secondary | ICD-10-CM | POA: Diagnosis not present

## 2020-05-23 DIAGNOSIS — E1129 Type 2 diabetes mellitus with other diabetic kidney complication: Secondary | ICD-10-CM | POA: Diagnosis not present

## 2020-05-24 ENCOUNTER — Other Ambulatory Visit (HOSPITAL_COMMUNITY): Payer: Self-pay | Admitting: Physician Assistant

## 2020-06-09 DIAGNOSIS — E1129 Type 2 diabetes mellitus with other diabetic kidney complication: Secondary | ICD-10-CM | POA: Diagnosis not present

## 2020-06-09 DIAGNOSIS — E782 Mixed hyperlipidemia: Secondary | ICD-10-CM | POA: Diagnosis not present

## 2020-06-09 DIAGNOSIS — E7800 Pure hypercholesterolemia, unspecified: Secondary | ICD-10-CM | POA: Diagnosis not present

## 2020-06-09 DIAGNOSIS — I10 Essential (primary) hypertension: Secondary | ICD-10-CM | POA: Diagnosis not present

## 2020-06-09 DIAGNOSIS — E039 Hypothyroidism, unspecified: Secondary | ICD-10-CM | POA: Diagnosis not present

## 2020-06-09 DIAGNOSIS — Z125 Encounter for screening for malignant neoplasm of prostate: Secondary | ICD-10-CM | POA: Diagnosis not present

## 2020-06-20 ENCOUNTER — Other Ambulatory Visit: Payer: Self-pay

## 2020-06-20 ENCOUNTER — Encounter: Payer: Self-pay | Admitting: Cardiology

## 2020-06-20 ENCOUNTER — Ambulatory Visit (INDEPENDENT_AMBULATORY_CARE_PROVIDER_SITE_OTHER): Payer: Medicare Other | Admitting: Cardiology

## 2020-06-20 VITALS — BP 128/66 | HR 81 | Ht 75.0 in | Wt 318.8 lb

## 2020-06-20 DIAGNOSIS — R3915 Urgency of urination: Secondary | ICD-10-CM | POA: Diagnosis not present

## 2020-06-20 DIAGNOSIS — I4819 Other persistent atrial fibrillation: Secondary | ICD-10-CM

## 2020-06-20 DIAGNOSIS — I6523 Occlusion and stenosis of bilateral carotid arteries: Secondary | ICD-10-CM | POA: Diagnosis not present

## 2020-06-20 DIAGNOSIS — N200 Calculus of kidney: Secondary | ICD-10-CM | POA: Diagnosis not present

## 2020-06-20 DIAGNOSIS — Z125 Encounter for screening for malignant neoplasm of prostate: Secondary | ICD-10-CM | POA: Diagnosis not present

## 2020-06-20 NOTE — Progress Notes (Signed)
Electrophysiology Office Note   Date:  06/20/2020   ID:  Brandon, Sanchez [birth year], MRN [id number]  PCP:  Merri Brunette, MD  Cardiologist:  Cristal Deer Primary Electrophysiologist:  Yadira Hada Jorja Loa, MD    Chief Complaint: AF   History of Present Illness: Brandon Sanchez is a 74 y.o. male who is being seen today for the evaluation of AF at the request of Merri Brunette, MD. Presenting today for electrophysiology evaluation.  He has a history of asthma, type 2 diabetes, hypertension, hyperlipidemia.  He had a diagnosis of atrial fibrillation when he was hospitalized 09/19/2019.  He had atrial fibrillation with rapid rates.  He underwent TEE and cardioversion and was started on amiodarone.  He is now status post AF ablation 03/17/2020.    Today, denies symptoms of palpitations, chest pain, shortness of breath, orthopnea, PND, lower extremity edema, claudication, dizziness, presyncope, syncope, bleeding, or neurologic sequela. The patient is tolerating medications without difficulties.  Since his ablation he has done well.  He has had no further episodes of atrial fibrillation.  He has felt well overall without complaint.  He is able to do all of his daily activities restriction.   Past Medical History:  Diagnosis Date  . Allergic rhinitis, cause unspecified   . Asthma   . Cataract cortical, senile, bilateral   . Macular degeneration of left eye   . Other and unspecified hyperlipidemia   . Type II or unspecified type diabetes mellitus without mention of complication, not stated as uncontrolled   . Unspecified essential hypertension    Past Surgical History:  Procedure Laterality Date  . ATRIAL FIBRILLATION ABLATION N/A 03/17/2020   Procedure: ATRIAL FIBRILLATION ABLATION;  Surgeon: Regan Lemming, MD;  Location: [facility] INVASIVE CV LAB;  Service: Cardiovascular;  Laterality: N/A;  . CARDIOVERSION N/A 09/22/2019   Procedure: CARDIOVERSION;  Surgeon: [name],  MD;  Location: [facility] ENDOSCOPY;  Service: Endoscopy;  Laterality: N/A;  . CARDIOVERSION N/A 10/18/2019   Procedure: CARDIOVERSION;  Surgeon: Jake Bathe, MD;  Location: [facility] ENDOSCOPY;  Service: Cardiovascular;  Laterality: N/A;  . TEE WITHOUT CARDIOVERSION N/A 09/22/2019   Procedure: TRANSESOPHAGEAL ECHOCARDIOGRAM (TEE);  Surgeon: [name], MD;  Location: [facility] ENDOSCOPY;  Service: Endoscopy;  Laterality: N/A;     Current Outpatient Medications  Medication Sig Dispense Refill  . albuterol (PROAIR HFA) 108 (90 BASE) MCG/ACT inhaler 2 puffs every 4 hours as needed only  if your can't catch your breath 1 Inhaler 1  . aspirin EC 81 MG EC tablet Take 1 tablet (81 mg total) by mouth daily. 60 tablet 0  . atorvastatin (LIPITOR) 80 MG tablet Take 1 tablet (80 mg total) by mouth every evening. 30 tablet 0  . B-D ULTRAFINE III SHORT PEN 31G X 8 MM MISC SMARTSIG:Pre-Filled Pen Syringe SUB-Q 3 Times Daily    . BREO ELLIPTA 200-25 MCG/INH AEPB Inhale 1 puff into the lungs daily as needed (shortness of breath).     . cyclobenzaprine (FLEXERIL) 10 MG tablet Take 10 mg by mouth as needed. For back pain    . ELIQUIS 5 MG TABS tablet TAKE 1 TABLET(5 MG) BY MOUTH TWICE DAILY 60 tablet 6  . furosemide (LASIX) 40 MG tablet Take 1 tablet (40 mg total) by mouth daily. 90 tablet 3  . Multiple Vitamin (MULTIVITAMIN WITH MINERALS) TABS tablet Take 1 tablet by mouth daily.    Marland Kitchen MYRBETRIQ 25 MG TB24 tablet Take 25 mg by mouth daily.    Marland Kitchen  NOVOLOG MIX 70/30 FLEXPEN (70-30) 100 UNIT/ML FlexPen Inject 30 Units into the skin in the morning and at bedtime.    Marland Kitchen omeprazole (PRILOSEC) 40 MG capsule Take 40 mg by mouth daily as needed (heart burn).     Letta Pate VERIO test strip 1 each by Other route in the morning, at noon, and at bedtime.     . polyethylene glycol (MIRALAX / GLYCOLAX) 17 g packet Take 17 g by mouth daily. 14 each 0  . PREVIDENT 5000 BOOSTER PLUS 1.1 % PSTE Place 1 application onto teeth in the morning  and at bedtime.     . sertraline (ZOLOFT) 100 MG tablet Take 100 mg by mouth daily.     No current facility-administered medications for this visit.    Allergies:   Patient has no known allergies.   Social History:  The patient  reports that he has never smoked. He has never used smokeless tobacco. He reports previous alcohol use. He reports that he does not use drugs.   Family History:  The patient's family history includes Colon cancer in his father; Heart disease in his paternal grandfather.   ROS:  Please see the history of present illness.   Otherwise, review of systems is positive for none.   All other systems are reviewed and negative.   PHYSICAL EXAM: VS:  BP 128/66   Pulse 81   Ht 6\' 3"  (1.905 m)   Wt (!) 318 lb 12.8 oz (144.6 kg)   SpO2 95%   BMI 39.85 kg/m  , BMI Body mass index is 39.85 kg/m. GEN: Well nourished, well developed, in no acute distress  HEENT: normal  Neck: no JVD, carotid bruits, or masses Cardiac: RRR; no murmurs, rubs, or gallops,no edema  Respiratory:  clear to auscultation bilaterally, normal work of breathing GI: soft, nontender, nondistended, + BS MS: no deformity or atrophy  Skin: warm and dry Neuro:  Strength and sensation are intact Psych: euthymic mood, full affect  EKG:  EKG is ordered today. Personal review of the ekg ordered shows sinus rhythm rate 81, left axis  Recent Labs: 09/20/2019: B Natriuretic Peptide 289.1 09/28/2019: TSH 2.357 12/06/2019: Magnesium 2.1 12/11/2019: ALT 16 03/06/2020: BUN 32; Creatinine, Ser 1.27; Potassium 4.4; Sodium 141 03/10/2020: Hemoglobin 12.8; Platelets 177    Lipid Panel     Component Value Date/Time   CHOL 96 12/04/2019 0503   TRIG 59 12/04/2019 0503   HDL 32 (L) 12/04/2019 0503   CHOLHDL 3.0 12/04/2019 0503   VLDL 12 12/04/2019 0503   LDLCALC 52 12/04/2019 0503     Wt Readings from Last 3 Encounters:  06/20/20 (!) 318 lb 12.8 oz (144.6 kg)  05/02/20 296 lb (134.3 kg)  04/27/20 299 lb 12.8  oz (136 kg)      Other studies Reviewed: Additional studies/ records that were reviewed today include: TTE 09/20/19  Review of the above records today demonstrates:  1. Left ventricular ejection fraction, by visual estimation, is 50 to  55%. The left ventricle has normal function. There is mildly increased  left ventricular hypertrophy.  2. Definity contrast agent was given IV to delineate the left ventricular  endocardial borders.  3. Left ventricular diastolic function could not be evaluated.  4. Mildly dilated left ventricular internal cavity size.  5. The left ventricle has no regional wall motion abnormalities.  6. Global right ventricle has normal systolic function.The right  ventricular size is mildly enlarged.  7. Left atrial size was mildly dilated.  8. Right atrial size was mildly dilated.  9. Mild mitral annular calcification.  10. Trivial mitral valve regurgitation. No evidence of mitral stenosis.  11. The tricuspid valve is normal in structure. Tricuspid valve  regurgitation is trivial.  12. The aortic valve was not well visualized. Aortic valve regurgitation  is not visualized. Mild to moderate aortic valve stenosis.  13. The pulmonic valve was not well visualized. Pulmonic valve  regurgitation is not visualized.  14. The inferior vena cava is dilated in size with <50% respiratory  variability, suggesting right atrial pressure of 15 mmHg.  15. Technically difficult; definity used; low normal LV systolic function;  mild LVH; mild LVE; calcified aortic valve with mild to moderate AS (mean  gradient 19 mmHg); mild LAE/RAE/RVE.    ASSESSMENT AND PLAN:  1.  Persistent atrial fibrillation: Currently on amiodarone (monitoring for high risk medication), Eliquis, Toprol.  CHA2DS2-VASc of 2 status post AF ablation 03/17/2020.  Fortunately he remains in sinus rhythm since his ablation.  Due to that we Lavelle Berland plan to stop amiodarone.  2.  Hyperlipidemia: Plan per primary  cardiology   Current medicines are reviewed at length with the patient today.   The patient does not have concerns regarding his medicines.  The following changes were made today: Stop amiodarone  Labs/ tests ordered today include:  Orders Placed This Encounter  Procedures  . EKG 12-Lead     Disposition:   FU with Tamu Golz 3 months  Signed, Chrishun Scheer Jorja Loa, MD  06/20/2020 10:14 AM     [facility] [address] [place] [address] [phone] (office) [phone] (fax)

## 2020-06-20 NOTE — Patient Instructions (Addendum)
Medication Instructions:  Your physician has recommended you make the following change in your medication:  1. STOP Amiodarone  *If you need a refill on your cardiac medications before your next appointment, please call your pharmacy*   Lab Work: None ordered   Testing/Procedures: None ordered   Follow-Up: At [facility], you and your health needs are our priority.  As part of our continuing mission to provide you with exceptional heart care, we have created designated Provider Care Teams.  These Care Teams include your primary Cardiologist (physician) and Advanced Practice Providers (APPs -  Physician Assistants and Nurse Practitioners) who all work together to provide you with the care you need, when you need it.  Your next appointment:   3 month(s)  The format for your next appointment:   In Person  Provider:   Will Camnitz, MD   Thank you for choosing [facility]!!   Tu Bayle, RN [phone]    Other Instructions    

## 2020-06-21 ENCOUNTER — Other Ambulatory Visit: Payer: Self-pay | Admitting: *Deleted

## 2020-06-21 NOTE — Patient Outreach (Signed)
[facility]) Care Management  06/21/2020  Brandon Sanchez Feb 11, 1946 [id number]   Call placed to member to follow up on management of A-fib.  State he is doing well, was seen by Dr. Elberta Fortis yesterday, amiodarone stopped as he has remained in sinus rhythm.  He has continued to check blood pressure and heart rate daily, stable and will continue to monitor.  Report he still has some swelling in his legs/ankles but state it has gotten better.  He was taken off of Lasix but has now restarted, only taking a third of what he was previously.  Noted his weight has increased from 296 pounds in September to 318 pounds yesterday.  Denies shortness of breath. State the 318 is a decrease from his highest weight.  He will continue to monitor daily and notify Dr. Cristal Deer (regular cardiologist) about weight change/concern.  He has also been watching his diet, low sodium and low carb/sugar.  Denies any urgent concerns, encouraged to contact this care manager with questions.  Agrees to follow up within the next month.  Goals Addressed            This Visit's Progress   . [facility] - Keep or Improve My Strength   On track    Follow Up Date 12/15   - eat healthy to increase strength - increase activity or exercise time a little every week    Why is this important?   Before the stroke you probably did not think much about being safe when you are up and about.  Now, it may be harder for you to get around.  It may also be easier for you to trip or fall.  It is common to have muscle weakness after a stroke. You may also feel like you cannot control an arm or leg.  It will be helpful to work with a physical therapist to get your strength and muscle control back.  It is good to stay as active as you can. Walking and stretching help you stay strong and flexible.  The physical therapist will develop an exercise program just for you.     Notes:   11/3 - encouraged again to start ecercise regime     . COMPLETED: [facility] - Make and Keep All Appointments   On track    Follow Up Date 06/21/20   - ask family or friend for a ride - call to cancel if needed - keep a calendar with appointment dates    Why is this important?   Part of staying healthy is seeing the doctor for follow-up care.  If you forget your appointments, there are some things you can do to stay on track.    Notes:     . [facility] - Track and Manage Heart Rate and Rhythm   On track    Follow Up Date 07/20/2020 (date updated)   - check pulse (heart) rate before taking medicine - check pulse (heart) rate once a day - keep all lab appointments    Why is this important?   Atrial fibrillation may have no symptoms. Sometimes the symptoms get worse or happen more often.  It is important to keep track of what your symptoms are and when they happen.  A change in symptoms is important to discuss with your doctor or nurse.  Being active and healthy eating will also help you manage your heart condition.     Notes:   11/2 - Medication change, encouraged to continue monitoring  Bellarae Lizer, RN, MSN [facility] Care Management  Community Care Manager [phone]  

## 2020-06-26 DIAGNOSIS — E782 Mixed hyperlipidemia: Secondary | ICD-10-CM | POA: Diagnosis not present

## 2020-06-26 DIAGNOSIS — E1129 Type 2 diabetes mellitus with other diabetic kidney complication: Secondary | ICD-10-CM | POA: Diagnosis not present

## 2020-06-26 DIAGNOSIS — Z7901 Long term (current) use of anticoagulants: Secondary | ICD-10-CM | POA: Diagnosis not present

## 2020-06-26 DIAGNOSIS — I6349 Cerebral infarction due to embolism of other cerebral artery: Secondary | ICD-10-CM | POA: Diagnosis not present

## 2020-06-26 DIAGNOSIS — Z23 Encounter for immunization: Secondary | ICD-10-CM | POA: Diagnosis not present

## 2020-06-26 DIAGNOSIS — I480 Paroxysmal atrial fibrillation: Secondary | ICD-10-CM | POA: Diagnosis not present

## 2020-06-26 DIAGNOSIS — R7989 Other specified abnormal findings of blood chemistry: Secondary | ICD-10-CM | POA: Diagnosis not present

## 2020-06-26 DIAGNOSIS — Z0000 Encounter for general adult medical examination without abnormal findings: Secondary | ICD-10-CM | POA: Diagnosis not present

## 2020-06-30 DIAGNOSIS — H25043 Posterior subcapsular polar age-related cataract, bilateral: Secondary | ICD-10-CM | POA: Diagnosis not present

## 2020-06-30 DIAGNOSIS — H35372 Puckering of macula, left eye: Secondary | ICD-10-CM | POA: Diagnosis not present

## 2020-06-30 DIAGNOSIS — E119 Type 2 diabetes mellitus without complications: Secondary | ICD-10-CM | POA: Diagnosis not present

## 2020-06-30 DIAGNOSIS — H25013 Cortical age-related cataract, bilateral: Secondary | ICD-10-CM | POA: Diagnosis not present

## 2020-06-30 DIAGNOSIS — H2512 Age-related nuclear cataract, left eye: Secondary | ICD-10-CM | POA: Diagnosis not present

## 2020-06-30 DIAGNOSIS — H2513 Age-related nuclear cataract, bilateral: Secondary | ICD-10-CM | POA: Diagnosis not present

## 2020-06-30 DIAGNOSIS — H18413 Arcus senilis, bilateral: Secondary | ICD-10-CM | POA: Diagnosis not present

## 2020-07-07 DIAGNOSIS — E1122 Type 2 diabetes mellitus with diabetic chronic kidney disease: Secondary | ICD-10-CM | POA: Diagnosis not present

## 2020-07-07 DIAGNOSIS — I129 Hypertensive chronic kidney disease with stage 1 through stage 4 chronic kidney disease, or unspecified chronic kidney disease: Secondary | ICD-10-CM | POA: Diagnosis not present

## 2020-07-07 DIAGNOSIS — N1832 Chronic kidney disease, stage 3b: Secondary | ICD-10-CM | POA: Diagnosis not present

## 2020-07-12 DIAGNOSIS — Z1212 Encounter for screening for malignant neoplasm of rectum: Secondary | ICD-10-CM | POA: Diagnosis not present

## 2020-07-12 DIAGNOSIS — Z1211 Encounter for screening for malignant neoplasm of colon: Secondary | ICD-10-CM | POA: Diagnosis not present

## 2020-07-21 ENCOUNTER — Other Ambulatory Visit: Payer: Self-pay | Admitting: *Deleted

## 2020-07-21 NOTE — Patient Outreach (Signed)
[facility]) Care Management  07/21/2020  Brandon Sanchez January 28, 1946 [id number]   Outgoing call placed to member, state he remains well.  Has cataract surgery scheduled for 1/15 and 1/30.  He was seen recently by PCP, last cardiology visit on 11/2, no new medication changes.  HR, BP, and weight all remain stable.  Next appointments with cardiology in February, with neurology in March.  Denies any urgent concerns, agrees to follow up within the next month.  If remain stable, will consider transition to health coach.  Goals Addressed            This Visit's Progress   . [facility] - Keep or Improve My Strength   On track    Follow Up Date 08/22/2020   - eat healthy to increase strength - increase activity or exercise time a little every week    Why is this important?   Before the stroke you probably did not think much about being safe when you are up and about.  Now, it may be harder for you to get around.  It may also be easier for you to trip or fall.  It is common to have muscle weakness after a stroke. You may also feel like you cannot control an arm or leg.  It will be helpful to work with a physical therapist to get your strength and muscle control back.  It is good to stay as active as you can. Walking and stretching help you stay strong and flexible.  The physical therapist will develop an exercise program just for you.     Notes:   11/3 - encouraged again to start ecercise regime 12/3 - Discussed use of Glucosamine to help with joint discomfort, will discuss with MD    . COMPLETED: [facility] - Track and Manage Heart Rate and Rhythm       Follow Up Date 07/20/2020 (date updated)   - check pulse (heart) rate before taking medicine - check pulse (heart) rate once a day - keep all lab appointments    Why is this important?   Atrial fibrillation may have no symptoms. Sometimes the symptoms get worse or happen more often.  It is important to keep track of what your  symptoms are and when they happen.  A change in symptoms is important to discuss with your doctor or nurse.  Being active and healthy eating will also help you manage your heart condition.     Notes:   11/2 - Medication change, encouraged to continue monitoring      Kemper Durie, RN, MSN [facility] Care Management  [facility] Manager [phone]

## 2020-07-23 LAB — COLOGUARD: COLOGUARD: NEGATIVE

## 2020-08-19 HISTORY — PX: CATARACT EXTRACTION, BILATERAL: SHX1313

## 2020-08-21 ENCOUNTER — Other Ambulatory Visit: Payer: Self-pay | Admitting: *Deleted

## 2020-08-21 ENCOUNTER — Telehealth: Payer: Self-pay | Admitting: *Deleted

## 2020-08-21 NOTE — Patient Outreach (Signed)
[facility]) Care Management  08/21/2020  Brandon Sanchez 16-Feb-1946 [id number]   Outgoing call placed to member to follow up on management of chronic conditions and plan for cataract surgery.  He report he is doing well, no abnormal readings with blood pressure and heart rate.  Was cleared by cardiology for cataract surgery, does not have to stop blood thinner for procedure.  Daughter will be in town to provide transportation and support.  Denies any urgent concerns, agrees to ongoing chronic disease management.  Will place referral to health coach and notify primary MD of transition.  Goals Addressed            This Visit's Progress   . [facility] - Keep or Improve My Strength   On track    Follow Up Date 08/22/2020   - eat healthy to increase strength - increase activity or exercise time a little every week    Why is this important?   Before the stroke you probably did not think much about being safe when you are up and about.  Now, it may be harder for you to get around.  It may also be easier for you to trip or fall.  It is common to have muscle weakness after a stroke. You may also feel like you cannot control an arm or leg.  It will be helpful to work with a physical therapist to get your strength and muscle control back.  It is good to stay as active as you can. Walking and stretching help you stay strong and flexible.  The physical therapist will develop an exercise program just for you.     Notes:   11/3 - encouraged again to start ecercise regime 12/3 - Discussed use of Glucosamine to help with joint discomfort, will discuss with MD        Kemper Durie, RN, MSN [facility] Care Management  [facility] Manager [phone]

## 2020-08-21 NOTE — Telephone Encounter (Signed)
Received form for clearance to inbox.

## 2020-08-21 NOTE — Telephone Encounter (Signed)
Signed and placed in outbox

## 2020-08-21 NOTE — Telephone Encounter (Signed)
Fax confirmation received for clearance (along with ofv note and med list/allergy list). [facility] [phone], [phone].

## 2020-08-22 ENCOUNTER — Other Ambulatory Visit: Payer: Self-pay | Admitting: *Deleted

## 2020-08-28 DIAGNOSIS — H2512 Age-related nuclear cataract, left eye: Secondary | ICD-10-CM | POA: Diagnosis not present

## 2020-09-05 ENCOUNTER — Other Ambulatory Visit: Payer: Self-pay | Admitting: *Deleted

## 2020-09-05 NOTE — Patient Outreach (Signed)
[facility]) Care Management  09/05/2020  Brandon Sanchez November 28, 1945 [id number]   RN Health Coach received a voice mail message from patient.  Per patient he does not want to participate in the [facility] Care Management Program. He stated he does not want anyone to call or come to his home.  Plan: RN will send a closure letter   Gean Maidens BSN RN [facility] Care Management [phone]

## 2020-09-18 DIAGNOSIS — H2511 Age-related nuclear cataract, right eye: Secondary | ICD-10-CM | POA: Diagnosis not present

## 2020-09-20 ENCOUNTER — Ambulatory Visit: Payer: Federal, State, Local not specified - PPO | Admitting: *Deleted

## 2020-09-29 ENCOUNTER — Encounter: Payer: Self-pay | Admitting: Cardiology

## 2020-09-29 ENCOUNTER — Ambulatory Visit (INDEPENDENT_AMBULATORY_CARE_PROVIDER_SITE_OTHER): Payer: Medicare Other | Admitting: Cardiology

## 2020-09-29 ENCOUNTER — Other Ambulatory Visit: Payer: Self-pay

## 2020-09-29 VITALS — BP 118/64 | HR 97 | Ht 75.0 in | Wt 329.8 lb

## 2020-09-29 DIAGNOSIS — I4819 Other persistent atrial fibrillation: Secondary | ICD-10-CM

## 2020-09-29 NOTE — Patient Instructions (Signed)
Medication Instructions:  Your physician recommends that you continue on your current medications as directed. Please refer to the Current Medication list given to you today.  *If you need a refill on your cardiac medications before your next appointment, please call your pharmacy*   Lab Work: None ordered If you have labs (blood work) drawn today and your tests are completely normal, you will receive your results only by: . MyChart Message (if you have MyChart) OR . A paper copy in the mail If you have any lab test that is abnormal or we need to change your treatment, we will call you to review the results.   Testing/Procedures: None ordered   Follow-Up: At [facility], you and your health needs are our priority.  As part of our continuing mission to provide you with exceptional heart care, we have created designated Provider Care Teams.  These Care Teams include your primary Cardiologist (physician) and Advanced Practice Providers (APPs -  Physician Assistants and Nurse Practitioners) who all work together to provide you with the care you need, when you need it.  We recommend signing up for the patient portal called "MyChart".  Sign up information is provided on this After Visit Summary.  MyChart is used to connect with patients for Virtual Visits (Telemedicine).  Patients are able to view lab/test results, encounter notes, upcoming appointments, etc.  Non-urgent messages can be sent to your provider as well.   To learn more about what you can do with MyChart, go to [email or web address]://[email or web address].com.    Your next appointment:   6 month(s)  The format for your next appointment:   In Person  Provider:   Will Camnitz, MD   Thank you for choosing [facility]!!   Mashell Sieben, RN [phone]    Other Instructions    

## 2020-09-29 NOTE — Progress Notes (Signed)
Electrophysiology Office Note   Date:  09/29/2020   ID:  Brandon Sanchez, Suit [birth year], MRN [id number]  PCP:  Brandon Brunette, MD  Cardiologist:  Brandon Sanchez Primary Electrophysiologist:  Brandon Sanchez Brandon Loa, MD    Chief Complaint: AF   History of Present Illness: Brandon Sanchez is a 75 y.o. male who is being seen today for the evaluation of AF at the request of Brandon Brunette, MD. Presenting today for electrophysiology evaluation.  He has a history significant for asthma, type 2 diabetes, hypertension, hyperlipidemia who presented to the hospital 09/19/2019 with rapid atrial fibrillation underwent TEE cardioversion and was loaded on amiodarone.  He is now status post AF ablation 03/17/2020.  Today, denies symptoms of palpitations, chest pain, shortness of breath, orthopnea, PND, lower extremity edema, claudication, dizziness, presyncope, syncope, bleeding, or neurologic sequela. The patient is tolerating medications without difficulties.  Since last being seen he has done well.  He is unaware of further episodes of atrial fibrillation.  He has no chest pain or shortness of breath.  Is able do all daily activities without restriction.   Past Medical History:  Diagnosis Date  . Allergic rhinitis, cause unspecified   . Asthma   . Cataract cortical, senile, bilateral   . Macular degeneration of left eye   . Other and unspecified hyperlipidemia   . Type II or unspecified type diabetes mellitus without mention of complication, not stated as uncontrolled   . Unspecified essential hypertension    Past Surgical History:  Procedure Laterality Date  . ATRIAL FIBRILLATION ABLATION N/A 03/17/2020   Procedure: ATRIAL FIBRILLATION ABLATION;  Surgeon: Brandon Lemming, MD;  [facility]: [facility];  Service: Cardiovascular;  Laterality: N/A;  . CARDIOVERSION N/A 09/22/2019   Procedure: CARDIOVERSION;  Surgeon: Brandon Ishikawa, MD;  Location: [facility] ENDOSCOPY;  Service: Endoscopy;   Laterality: N/A;  . CARDIOVERSION N/A 10/18/2019   Procedure: CARDIOVERSION;  Surgeon: Brandon Bathe, MD;  Location: [facility] ENDOSCOPY;  Service: Cardiovascular;  Laterality: N/A;  . TEE WITHOUT CARDIOVERSION N/A 09/22/2019   Procedure: TRANSESOPHAGEAL ECHOCARDIOGRAM (TEE);  Surgeon: Brandon Ishikawa, MD;  Location: [facility] ENDOSCOPY;  Service: Endoscopy;  Laterality: N/A;     Current Outpatient Medications  Medication Sig Dispense Refill  . albuterol (PROAIR HFA) 108 (90 BASE) MCG/ACT inhaler 2 puffs every 4 hours as needed only  if your can't catch your breath 1 Inhaler 1  . aspirin EC 81 MG EC tablet Take 1 tablet (81 mg total) by mouth daily. 60 tablet 0  . atorvastatin (LIPITOR) 80 MG tablet Take 1 tablet (80 mg total) by mouth every evening. 30 tablet 0  . B-D ULTRAFINE III SHORT PEN 31G X 8 MM MISC SMARTSIG:Pre-Filled Pen Syringe SUB-Q 3 Times Daily    . BREO ELLIPTA 200-25 MCG/INH AEPB Inhale 1 puff into the lungs daily as needed (shortness of breath).     Marland Kitchen ELIQUIS 5 MG TABS tablet TAKE 1 TABLET(5 MG) BY MOUTH TWICE DAILY 60 tablet 6  . furosemide (LASIX) 40 MG tablet Take 1 tablet (40 mg total) by mouth daily. 90 tablet 3  . Multiple Vitamin (MULTIVITAMIN WITH MINERALS) TABS tablet Take 1 tablet by mouth daily.    Marland Kitchen MYRBETRIQ 25 MG TB24 tablet Take 25 mg by mouth daily.    Marland Kitchen NOVOLOG MIX 70/30 FLEXPEN (70-30) 100 UNIT/ML FlexPen Inject 30 Units into the skin in the morning and at bedtime.    Marland Kitchen omeprazole (PRILOSEC) 40 MG capsule Take 40 mg by  mouth daily as needed (heart burn).    Letta Pate VERIO test strip 1 each by Other route in the morning, at noon, and at bedtime.     . polyethylene glycol (MIRALAX / GLYCOLAX) 17 g packet Take 17 g by mouth daily. 14 each 0  . PREVIDENT 5000 BOOSTER PLUS 1.1 % PSTE Place 1 application onto teeth in the morning and at bedtime.     . sertraline (ZOLOFT) 100 MG tablet Take 100 mg by mouth daily.     No current facility-administered medications for  this visit.    Allergies:   Patient has no known allergies.   Social History:  The patient  reports that he has never smoked. He has never used smokeless tobacco. He reports previous alcohol use. He reports that he does not use drugs.   Family History:  The patient's family history includes Colon cancer in his father; Heart disease in his paternal grandfather.   ROS:  Please see the history of present illness.   Otherwise, review of systems is positive for none.   All other systems are reviewed and negative.   PHYSICAL EXAM: VS:  BP 118/64   Pulse 97   Ht 6\' 3"  (1.905 m)   Wt (!) 329 lb 12.8 oz (149.6 kg)   SpO2 94%   BMI 41.22 kg/m  , BMI Body mass index is 41.22 kg/m. GEN: Well nourished, well developed, in no acute distress  HEENT: normal  Neck: no JVD, carotid bruits, or masses Cardiac: RRR; no murmurs, rubs, or gallops,no edema  Respiratory:  clear to auscultation bilaterally, normal work of breathing GI: soft, nontender, nondistended, + BS MS: no deformity or atrophy  Skin: warm and dry Neuro:  Strength and sensation are intact Psych: euthymic mood, full affect  EKG:  EKG is ordered today. Personal review of the ekg ordered shows sinus rhythm, rate 97  Recent Labs: 12/06/2019: Magnesium 2.1 12/11/2019: ALT 16 03/06/2020: BUN 32; Creatinine, Ser 1.27; Potassium 4.4; Sodium 141 03/10/2020: Hemoglobin 12.8; Platelets 177    Lipid Panel     Component Value Date/Time   CHOL 96 12/04/2019 0503   TRIG 59 12/04/2019 0503   HDL 32 (L) 12/04/2019 0503   CHOLHDL 3.0 12/04/2019 0503   VLDL 12 12/04/2019 0503   LDLCALC 52 12/04/2019 0503     Wt Readings from Last 3 Encounters:  09/29/20 (!) 329 lb 12.8 oz (149.6 kg)  06/20/20 (!) 318 lb 12.8 oz (144.6 kg)  05/02/20 296 lb (134.3 kg)      Other studies Reviewed: Additional studies/ records that were reviewed today include: TTE 09/20/19  Review of the above records today demonstrates:  1. Left ventricular ejection  fraction, by visual estimation, is 50 to  55%. The left ventricle has normal function. There is mildly increased  left ventricular hypertrophy.  2. Definity contrast agent was given IV to delineate the left ventricular  endocardial borders.  3. Left ventricular diastolic function could not be evaluated.  4. Mildly dilated left ventricular internal cavity size.  5. The left ventricle has no regional wall motion abnormalities.  6. Global right ventricle has normal systolic function.The right  ventricular size is mildly enlarged.  7. Left atrial size was mildly dilated.  8. Right atrial size was mildly dilated.  9. Mild mitral annular calcification.  10. Trivial mitral valve regurgitation. No evidence of mitral stenosis.  11. The tricuspid valve is normal in structure. Tricuspid valve  regurgitation is trivial.  12. The aortic  valve was not well visualized. Aortic valve regurgitation  is not visualized. Mild to moderate aortic valve stenosis.  13. The pulmonic valve was not well visualized. Pulmonic valve  regurgitation is not visualized.  14. The inferior vena cava is dilated in size with <50% respiratory  variability, suggesting right atrial pressure of 15 mmHg.  15. Technically difficult; definity used; low normal LV systolic function;  mild LVH; mild LVE; calcified aortic valve with mild to moderate AS (mean  gradient 19 mmHg); mild LAE/RAE/RVE.    ASSESSMENT AND PLAN:  1.  Persistent atrial fibrillation: Currently on Eliquis and Toprol-XL.  CHA2DS2-VASc of 2.  Is status post AF ablation 03/17/2020.  He remains in sinus rhythm.  Continue with current management.  2.  Hyperlipidemia: Plan per primary cardiology and PCP   Current medicines are reviewed at length with the patient today.   The patient does not have concerns regarding his medicines.  The following changes were made today: None  Labs/ tests ordered today include:  Orders Placed This Encounter  Procedures  .  EKG 12-Lead     Disposition:   FU with Hadi Dubin 6 months  Signed, Addis Bennie Brandon Loa, MD  09/29/2020 10:39 AM     [facility] [address] [place] [address] [phone] (office) [phone] (fax)

## 2020-10-02 ENCOUNTER — Encounter: Payer: Self-pay | Admitting: Cardiology

## 2020-10-02 ENCOUNTER — Ambulatory Visit (INDEPENDENT_AMBULATORY_CARE_PROVIDER_SITE_OTHER): Payer: Medicare Other | Admitting: Cardiology

## 2020-10-02 ENCOUNTER — Other Ambulatory Visit: Payer: Self-pay

## 2020-10-02 VITALS — BP 122/68 | HR 98 | Ht 75.0 in | Wt 327.4 lb

## 2020-10-02 DIAGNOSIS — Z8673 Personal history of transient ischemic attack (TIA), and cerebral infarction without residual deficits: Secondary | ICD-10-CM

## 2020-10-02 DIAGNOSIS — R011 Cardiac murmur, unspecified: Secondary | ICD-10-CM

## 2020-10-02 DIAGNOSIS — D6869 Other thrombophilia: Secondary | ICD-10-CM

## 2020-10-02 DIAGNOSIS — I480 Paroxysmal atrial fibrillation: Secondary | ICD-10-CM

## 2020-10-02 DIAGNOSIS — Z7189 Other specified counseling: Secondary | ICD-10-CM

## 2020-10-02 NOTE — Progress Notes (Signed)
Cardiology Office Note:    Date:  10/02/2020   ID:  Brandon Sanchez, DOB October 05, 1945, MRN [id number]  PCP:  Merri Brunette, MD  Cardiologist:  Jodelle Red, MD  EP: Dr. Elberta Fortis  Referring MD: Merri Brunette, MD   CC: follow up  History of Present Illness:    Brandon Sanchez is a 75 y.o. male with a hx of asthma, type II diabetes, hypertension, hyperlipidemia, atrial fibrillation who is seen for follow up.   Cardiac history: admitted 09/19/19 with atrial fibrillation with RVR. Has known history of type II diabetes, hypertension, and hyperlipidemia. He was also treated for community acquired pneumonia during that admission. He was started on anticoagulation, and rate control was attempted. Course was complicated by hypotension. He underwent TEE-CV with initial return to sinus rhythm. However after discharge, he returned to afib with RVR.   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 Hospital notes reviewed. His presenting symptoms were acute left sided weakness and facial droop. He was outside the tPA window. He was continued on apixaban and atorvastatin (increased from 40 mg to 80 mg), with aspirin added. He completed an additional week of inpatient rehab prior to returning home.  S/p afib ablation by Dr. Elberta Fortis on 03/17/20.   Today: Daughter present via speakerphone today. Had two cataracts surgery, did well. Saw Dr. Elberta Fortis on 2/11. Heart rate tends to be in the 70s-80s at home, in the 90s at doctor's visits. Discussed today.   He was told he had mitral valve prolapse years ago. Told at preop that he had a murmur. Asking about this, reviewed his echoes and talked about murmurs in general. No MVP noted, moderate aortic stenosis on echo.  Asking about why he was on multiple antihypertensives in the past and now only requires lasix. We discussed this. Takes BP at home, sometimes multiple times/day. Always well controlled. No edema in the  AM, mild by the end of the day.  Denies chest pain, shortness of breath at rest or with normal exertion. No PND, orthopnea, change in LE edema or unexpected weight gain. No syncope or palpitations.  No bleedings issues. Recent stool test negative for blood per report.  Diabetes well controlled. On aspirin given CVA, statin. Discussed SGLT2i and GLP1RA given his ASCVD history (CVA). Has upcoming appt with Dr. Talmage Nap, will send note to her as well.  Past Medical History:  Diagnosis Date  . Allergic rhinitis, cause unspecified   . Asthma   . Cataract cortical, senile, bilateral   . Macular degeneration of left eye   . Other and unspecified hyperlipidemia   . Type II or unspecified type diabetes mellitus without mention of complication, not stated as uncontrolled   . Unspecified essential hypertension     Past Surgical History:  Procedure Laterality Date  . ATRIAL FIBRILLATION ABLATION N/A 03/17/2020   Procedure: ATRIAL FIBRILLATION ABLATION;  Surgeon: Regan Lemming, MD;  Location: [facility] INVASIVE CV LAB;  Service: Cardiovascular;  Laterality: N/A;  . CARDIOVERSION N/A 09/22/2019   Procedure: CARDIOVERSION;  Surgeon: [name], MD;  Location: [facility] ENDOSCOPY;  Service: Endoscopy;  Laterality: N/A;  . CARDIOVERSION N/A 10/18/2019   Procedure: CARDIOVERSION;  Surgeon: Jake Bathe, MD;  Location: [facility] ENDOSCOPY;  Service: Cardiovascular;  Laterality: N/A;  . TEE WITHOUT CARDIOVERSION N/A 09/22/2019   Procedure: TRANSESOPHAGEAL ECHOCARDIOGRAM (TEE);  Surgeon: [name], MD;  Location: [facility] ENDOSCOPY;  Service: Endoscopy;  Laterality: N/A;    Current Medications: Current Outpatient Medications on  File Prior to Visit  Medication Sig  . albuterol (PROAIR HFA) 108 (90 BASE) MCG/ACT inhaler 2 puffs every 4 hours as needed only  if your can't catch your breath  . aspirin EC 81 MG EC tablet Take 1 tablet (81 mg total) by mouth daily.  Marland Kitchen atorvastatin (LIPITOR) 80 MG tablet Take  1 tablet (80 mg total) by mouth every evening.  . B-D ULTRAFINE III SHORT PEN 31G X 8 MM MISC SMARTSIG:Pre-Filled Pen Syringe SUB-Q 3 Times Daily  . BREO ELLIPTA 200-25 MCG/INH AEPB Inhale 1 puff into the lungs daily as needed (shortness of breath).   Marland Kitchen ELIQUIS 5 MG TABS tablet TAKE 1 TABLET(5 MG) BY MOUTH TWICE DAILY  . furosemide (LASIX) 40 MG tablet Take 1 tablet (40 mg total) by mouth daily.  . Multiple Vitamin (MULTIVITAMIN WITH MINERALS) TABS tablet Take 1 tablet by mouth daily.  Marland Kitchen MYRBETRIQ 25 MG TB24 tablet Take 25 mg by mouth daily.  Marland Kitchen NOVOLOG MIX 70/30 FLEXPEN (70-30) 100 UNIT/ML FlexPen Inject 30 Units into the skin in the morning and at bedtime.  Marland Kitchen omeprazole (PRILOSEC) 40 MG capsule Take 40 mg by mouth daily as needed (heart burn).  Letta Pate VERIO test strip 1 each by Other route in the morning, at noon, and at bedtime.   . polyethylene glycol (MIRALAX / GLYCOLAX) 17 g packet Take 17 g by mouth daily.  Marland Kitchen PREVIDENT 5000 BOOSTER PLUS 1.1 % PSTE Place 1 application onto teeth in the morning and at bedtime.   . sertraline (ZOLOFT) 100 MG tablet Take 100 mg by mouth daily.   No current facility-administered medications on file prior to visit.     Allergies:   Patient has no known allergies.   Social History   Tobacco Use  . Smoking status: Never Smoker  . Smokeless tobacco: Never Used  Substance Use Topics  . Alcohol use: Not Currently    Comment: Used to drink 2 L bottle of wine/day. Quit 09/18/19  . Drug use: Never    Family History: family history includes Colon cancer in his father; Heart disease in his paternal grandfather.  ROS:   Please see the history of present illness.  Additional pertinent ROS negative except as noted in HPI   EKGs/Labs/Other Studies Reviewed:    The following studies were reviewed today: Echo 12/04/19 1. Left ventricular ejection fraction, by estimation, is 55 to 60%. The  left ventricle has normal function. The left ventricle has no  regional  wall motion abnormalities. The left ventricular internal cavity size was  mildly dilated. There is mild  concentric left ventricular hypertrophy. Left ventricular diastolic  parameters are consistent with Grade III diastolic dysfunction  (restrictive). Elevated left ventricular end-diastolic pressure.  2. Right ventricular systolic function is normal. The right ventricular  size is mildly enlarged.  3. Left atrial size was mildly dilated.  4. Right atrial size was mild to moderately dilated.  5. The mitral valve is grossly normal. Trivial mitral valve  regurgitation. No evidence of mitral stenosis.  6. The aortic valve is tricuspid. Aortic valve regurgitation is mild to  moderate. Moderate aortic valve stenosis.  7. The inferior vena cava is dilated in size with >50% respiratory  variability, suggesting right atrial pressure of 8 mmHg.   Echo 09/20/19 1. Left ventricular ejection fraction, by visual estimation, is 50 to  55%. The left ventricle has normal function. There is mildly increased  left ventricular hypertrophy.  2. Definity contrast agent was given IV  to delineate the left ventricular  endocardial borders.  3. Left ventricular diastolic function could not be evaluated.  4. Mildly dilated left ventricular internal cavity size.  5. The left ventricle has no regional wall motion abnormalities.  6. Global right ventricle has normal systolic function.The right  ventricular size is mildly enlarged.  7. Left atrial size was mildly dilated.  8. Right atrial size was mildly dilated.  9. Mild mitral annular calcification.  10. Trivial mitral valve regurgitation. No evidence of mitral stenosis.  11. The tricuspid valve is normal in structure. Tricuspid valve  regurgitation is trivial.  12. The aortic valve was not well visualized. Aortic valve regurgitation  is not visualized. Mild to moderate aortic valve stenosis.  13. The pulmonic valve was not well  visualized. Pulmonic valve  regurgitation is not visualized.  14. The inferior vena cava is dilated in size with <50% respiratory  variability, suggesting right atrial pressure of 15 mmHg.  15. Technically difficult; definity used; low normal LV systolic function;  mild LVH; mild LVE; calcified aortic valve with mild to moderate AS (mean  gradient 19 mmHg); mild LAE/RAE/RVE.   EKG:  EKG is personally reviewed.  The ekg ordered 06/20/20 demonstrates NSR at 81 bpm, LAFB, IVCD  Recent Labs: 12/06/2019: Magnesium 2.1 12/11/2019: ALT 16 03/06/2020: BUN 32; Creatinine, Ser 1.27; Potassium 4.4; Sodium 141 03/10/2020: Hemoglobin 12.8; Platelets 177  Recent Lipid Panel    Component Value Date/Time   CHOL 96 12/04/2019 0503   TRIG 59 12/04/2019 0503   HDL 32 (L) 12/04/2019 0503   CHOLHDL 3.0 12/04/2019 0503   VLDL 12 12/04/2019 0503   LDLCALC 52 12/04/2019 0503    Physical Exam:    VS:  BP 122/68   Pulse 98   Ht 6\' 3"  (1.905 m)   Wt (!) 327 lb 6.4 oz (148.5 kg)   BMI 40.92 kg/m     Wt Readings from Last 3 Encounters:  10/02/20 (!) 327 lb 6.4 oz (148.5 kg)  09/29/20 (!) 329 lb 12.8 oz (149.6 kg)  06/20/20 (!) 318 lb 12.8 oz (144.6 kg)    GEN: Well nourished, well developed in no acute distress HEENT: Normal, moist mucous membranes NECK: No JVD CARDIAC: regular rhythm, normal S1 and S2, no rubs or gallops. 2 separate systolic murmurs, both 2/6. Aortic stenosis murmur RUSB, MR murmur LSB--different qualities VASCULAR: Radial and DP pulses 2+ bilaterally. No carotid bruits RESPIRATORY:  Clear to auscultation without rales, wheezing or rhonchi  ABDOMEN: Soft, non-tender, non-distended MUSCULOSKELETAL:  Ambulates independently SKIN: Warm and dry, trivial bilateral LE edema NEUROLOGIC:  Alert and oriented x 3. No focal neuro deficits noted. PSYCHIATRIC:  Normal affect   ASSESSMENT:    1. Paroxysmal atrial fibrillation (HCC)   2. History of CVA (cerebrovascular accident)   3.  Secondary hypercoagulable state (HCC)   4. Cardiac risk counseling   5. Murmur, cardiac    PLAN:    Paroxysmal atrial fibrillation with RVR:  -In NSR today. -on apixaban for anticoagulation -s/p ablation by Dr. 13/02/21.  -instructed on red flag warning signs that need immediate medical attention  History of CVA: -on aspirin, apixaban, and atorvastatin  LE edema:  -only trivial swelling today  Murmurs: -able to determine 2 different murmurs based on quality. Still 2/6. Harsher AS murmur on right, blowing MR murmur on left sternal border. 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 Does have moderate AS. Monitor periodically.  Possible OSA: sleep study  didn't meet criteria for CPAP. Recommended to avoid laying supine.  CV risk counseling and prevention: -recommend heart healthy/Mediterranean diet, with whole grains, fruits, vegetable, fish, lean meats, nuts, and olive oil. Limit salt. -recommend moderate walking, 3-5 times/week for 30-50 minutes each session. Aim for at least 150 minutes.week. Goal should be pace of 3 miles/hours, or walking 1.5 miles in 30 minutes -recommend avoidance of tobacco products. Avoid excess alcohol.  Plan for follow up: 9 mos, to stagger with Dr. Annye Asaamnitz  Sheddrick Lattanzio, MD, PhD, [facility] [place]  [facility]   Medication Adjustments/Labs and Tests Ordered: Current medicines are reviewed at length with the patient today.  Concerns regarding medicines are outlined above.  No orders of the defined types were placed in this encounter.  No orders of the defined types were placed in this encounter.   Patient Instructions  Medication Instructions:  Your Physician recommend you continue on your current medication as directed.    *If you need a refill on your cardiac medications before your next appointment, please call your pharmacy*   Lab Work: None   Testing/Procedures: None   Follow-Up: At [facility], you and your health needs are our priority.  As part of our continuing mission to provide you with exceptional heart care, we have created designated Provider Care Teams.  These Care Teams include your primary Cardiologist (physician) and Advanced Practice Providers (APPs -  Physician Assistants and Nurse Practitioners) who all work together to provide you with the care you need, when you need it.  We recommend signing up for the patient portal called "MyChart".  Sign up information is provided on this After Visit Summary.  MyChart is used to connect with patients for Virtual Visits (Telemedicine).  Patients are able to view lab/test results, encounter notes, upcoming appointments, etc.  Non-urgent messages can be sent to your provider as well.   To learn more about what you can do with MyChart, go to [email or web address].    Your next appointment:   9 month(s)  The format for your next appointment:   In Person  Provider:   Jodelle RedBridgette Ifeanyichukwu Wickham, MD       Signed, Jodelle RedBridgette Tycen Dockter, MD PhD 10/02/2020   [facility]

## 2020-10-02 NOTE — Patient Instructions (Signed)
Medication Instructions:  Your Physician recommend you continue on your current medication as directed.    *If you need a refill on your cardiac medications before your next appointment, please call your pharmacy*   Lab Work: None   Testing/Procedures: None   Follow-Up: At [facility], you and your health needs are our priority.  As part of our continuing mission to provide you with exceptional heart care, we have created designated Provider Care Teams.  These Care Teams include your primary Cardiologist (physician) and Advanced Practice Providers (APPs -  Physician Assistants and Nurse Practitioners) who all work together to provide you with the care you need, when you need it.  We recommend signing up for the patient portal called "MyChart".  Sign up information is provided on this After Visit Summary.  MyChart is used to connect with patients for Virtual Visits (Telemedicine).  Patients are able to view lab/test results, encounter notes, upcoming appointments, etc.  Non-urgent messages can be sent to your provider as well.   To learn more about what you can do with MyChart, go to [email or web address].    Your next appointment:   9 month(s)  The format for your next appointment:   In Person  Provider:   Bridgette Christopher, MD     

## 2020-10-25 ENCOUNTER — Other Ambulatory Visit: Payer: Self-pay

## 2020-10-25 ENCOUNTER — Ambulatory Visit (INDEPENDENT_AMBULATORY_CARE_PROVIDER_SITE_OTHER): Payer: Medicare Other | Admitting: Adult Health

## 2020-10-25 ENCOUNTER — Encounter: Payer: Self-pay | Admitting: Adult Health

## 2020-10-25 VITALS — BP 122/74 | HR 86 | Ht 75.0 in | Wt 325.0 lb

## 2020-10-25 DIAGNOSIS — I6381 Other cerebral infarction due to occlusion or stenosis of small artery: Secondary | ICD-10-CM

## 2020-10-25 NOTE — Patient Instructions (Signed)
Continue aspirin 81 mg daily and Eliquis (apixaban) daily  and atorvastatin  for secondary stroke prevention  Once you are ready, would recommend obtaining a carotid ultrasound for check for carotid stenosis   Continue to follow up with PCP regarding cholesterol and blood pressure management  Continue to follow up with endocrinology for diabetic management Continue to follow with cariology as scheduled for atrial fibrillation and eliquis management  Maintain strict control of hypertension with blood pressure goal below 130/90, diabetes with hemoglobin A1c goal below 6.5% and cholesterol with LDL cholesterol (bad cholesterol) goal below 70 mg/dL.      Followup in the future with me in 6 months or call earlier if needed     Thank you for coming to see [place] at [facility]. I hope we have been able to provide you high quality care today.  You may receive a patient satisfaction survey over the next few weeks. We would appreciate your feedback and comments so that we may continue to improve ourselves and the health of our patients.

## 2020-10-25 NOTE — Progress Notes (Signed)
[facility] [address] [place]. [place] [address] [phone]       STROKE FOLLOW UP NOTE  [name] Date of Birth:  01-06-1946 Medical Record Number:  [id number]   Reason for Referral: stroke follow up    SUBJECTIVE:   CHIEF COMPLAINT:  Chief Complaint  Patient presents with  . Follow-up    Tr alone Pt is well, no complaints on stroke standpoint      HPI:   Today, 10/25/2020, Brandon Sanchez returns for 18-month stroke follow-up unaccompanied  He has been doing well since prior visit without new or recurrent stroke/TIA symptoms Compliant on aspirin, Eliquis and atorvastatin  -denies side effects Blood pressure today 122/74 - monitors at home and typically stable  Glucose levels stable per patient report - has f/u with endocrinology next month  Routinely followed by cardiology and PCP - OV notes personally reviewed Lipid panel 05/2020 LDL 81 A1c previously around "6 something" per patient in October showing improvement from prior result -unable to personally view via epic  No new concerns at this time    History provided for reference purposes only Update 04/27/2020 JM: Brandon Sanchez returns for stroke follow-up Doing well from a stroke standpoint without residual deficits and denies new or reoccurring stroke/TIA symptoms Remains on aspirin 81 mg daily and Eliquis without bleeding or bruising for secondary stroke prevention and atrial fibrillation Remains on atorvastatin 80 mg daily without myalgias Blood pressure today 120/60 Glucose levels avg 130-140 per patient He is requesting clearance to pursue cataract surgery No further concerns at this time  Initial visit 01/25/2020 JM: Brandon Sanchez is being seen for hospital follow-up.  He is accompanied by his daughter via telephone during visit.  He was discharged from CIR to home with outpatient therapies on 12/17/2019.  He has recovered well from a stroke standpoint with complete resolution and  return to baseline.  Discharged from outpatient PT on 01/18/2020 due to meeting all goals.  He has continued on aspirin 81 mg daily and Eliquis without bleeding or bruising.  Continues on atorvastatin 80 mg daily without myalgias.  Blood pressure today 109/66 -monitors at home routinely and currently has been ranging 110-120/70-80s.  He was restarted on Lasix due to lower extremity edema with blood pressure ranging 130-150 prior to restarting.  Glucose levels stable and continues to follow with endocrinology for monitoring and management.  Continues to follow closely with PCP, endocrinology and cardiology. He did undergo sleep study prior to his stroke on 11/24/2019 which was negative for sleep apnea.  No concerns at this time.  Stroke admission 12/03/2019 Brandon Sanchez is a 75 y.o. male with history of atrial fibrillation and is on Eliquis, asthma, Htn and DM  who presented on 12/03/2019 with acute onset of left hemiplegia, left facial droop and dysarthria.   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 fib could be potential etiology but he is on Eliquis and stroke location not typical for A. fib related infarct.  CTA showed atherosclerotic changes in the distal common carotid arteries at the carotid bifurcations bilaterally significant stenosis.  Continuation of Eliquis for atrial fibrillation with addition of aspirin 81 mg daily for secondary stroke prevention.  History of HTN with hypotension during admission with fluctuation of neuro symptoms and discontinue metoprolol and lowered furosemide dosage.  History of HLD on atorvastatin 40 mg daily with LDL 52 and increase atorvastatin dosage to 80 mg daily.  DM with A1c 7.6.  Other stroke risk factors include advanced age, prior EtOH use and obesity but no prior stroke history.  Residual deficits of mild left facial droop and left hemiparesis.  Evaluated by therapies who recommended discharge to  CIR for functional decline.  Stroke: right BG/CR small scattered infarcts, likely due to right CCA mixed plaque. Although afib could be potential etiology, but he is on eliquis and stroke location not typical for afib related infarct.   CT Head - No acute intracranial abnormality.   CTA H&N - No emergent large vessel occlusion. Atherosclerotic changes in the distal common carotid arteries and at the carotid bifurcations bilaterally significant stenosis.   MRI head right BG/CR small infarcts  MRA head - normal  2D Echo EF 55-60%  [employer] Virus 2 - negative  LDL - 52  HgbA1c - 7.6  VTE prophylaxis - Eliquis  Eliquis (apixaban) daily prior to admission, now on aspirin 81 mg daily and Eliquis (apixaban) daily. Continue on discharge  Patient counseled to be compliant with his antithrombotic medications  Ongoing aggressive stroke risk factor management  Therapy recommendations: CIR  Disposition:  CIR     ROS:   14 system review of systems performed and negative with exception of no complaints  PMH:  Past Medical History:  Diagnosis Date  . Allergic rhinitis, cause unspecified   . Asthma   . Cataract cortical, senile, bilateral   . Macular degeneration of left eye   . Other and unspecified hyperlipidemia   . Type II or unspecified type diabetes mellitus without mention of complication, not stated as uncontrolled   . Unspecified essential hypertension     PSH:  Past Surgical History:  Procedure Laterality Date  . ATRIAL FIBRILLATION ABLATION N/A 03/17/2020   Procedure: ATRIAL FIBRILLATION ABLATION;  Surgeon: Regan Lemming, MD;  Location: [facility] INVASIVE CV LAB;  Service: Cardiovascular;  Laterality: N/A;  . CARDIOVERSION N/A 09/22/2019   Procedure: CARDIOVERSION;  Surgeon: [name], MD;  Location: [facility] ENDOSCOPY;  Service: Endoscopy;  Laterality: N/A;  . CARDIOVERSION N/A 10/18/2019   Procedure: CARDIOVERSION;  Surgeon: Jake Bathe, MD;  Location:  [facility] ENDOSCOPY;  Service: Cardiovascular;  Laterality: N/A;  . CATARACT EXTRACTION, BILATERAL  08/2020  . TEE WITHOUT CARDIOVERSION N/A 09/22/2019   Procedure: TRANSESOPHAGEAL ECHOCARDIOGRAM (TEE);  Surgeon: [name], MD;  Location: [facility] ENDOSCOPY;  Service: Endoscopy;  Laterality: N/A;    Social History:  Social History   Socioeconomic History  . Marital status: Divorced    Spouse name: Not on file  . Number of children: Not on file  . Years of education: Not on file  . Highest education level: Professional school degree (e.g., MD, DDS, DVM, JD)  Occupational History  . Occupation: Armed forces logistics/support/administrative officer  Tobacco Use  . Smoking status: Never Smoker  . Smokeless tobacco: Never Used  Substance and Sexual Activity  . Alcohol use: Not Currently    Comment: Used to drink 2 L bottle of wine/day. Quit 09/18/19  . Drug use: Never  . Sexual activity: Not Currently  Other Topics Concern  . Not on file  Social History Narrative  . Not on file   Social Determinants of Health   Financial Resource Strain: Not on file  Food Insecurity: Not on file  Transportation Needs: Not on file  Physical Activity: Not on file  Stress: Not on file  Social Connections: Not on file  Intimate Partner Violence: Not on file    Family History:  Family History  Problem Relation Age of Onset  . Colon cancer Father   . Heart disease Paternal Grandfather     Medications:   Current Outpatient Medications on File Prior to Visit  Medication Sig Dispense Refill  . albuterol (PROAIR HFA) 108 (90 BASE) MCG/ACT inhaler 2 puffs every 4 hours as needed only  if your can't catch your breath 1 Inhaler 1  . aspirin EC 81 MG EC tablet Take 1 tablet (81 mg total) by mouth daily. 60 tablet 0  . atorvastatin (LIPITOR) 80 MG tablet Take 1 tablet (80 mg total) by mouth every evening. 30 tablet 0  . B-D ULTRAFINE III SHORT PEN 31G X 8 MM MISC SMARTSIG:Pre-Filled Pen Syringe SUB-Q 3 Times Daily    . BREO ELLIPTA  200-25 MCG/INH AEPB Inhale 1 puff into the lungs daily as needed (shortness of breath).     Marland Kitchen. ELIQUIS 5 MG TABS tablet TAKE 1 TABLET(5 MG) BY MOUTH TWICE DAILY 60 tablet 6  . furosemide (LASIX) 40 MG tablet Take 1 tablet (40 mg total) by mouth daily. 90 tablet 3  . Multiple Vitamin (MULTIVITAMIN WITH MINERALS) TABS tablet Take 1 tablet by mouth daily.    Marland Kitchen. MYRBETRIQ 25 MG TB24 tablet Take 25 mg by mouth daily.    Marland Kitchen. NOVOLOG MIX 70/30 FLEXPEN (70-30) 100 UNIT/ML FlexPen Inject 30 Units into the skin in the morning and at bedtime.    Marland Kitchen. omeprazole (PRILOSEC) 40 MG capsule Take 40 mg by mouth daily as needed (heart burn).    Letta Pate. ONETOUCH VERIO test strip 1 each by Other route in the morning, at noon, and at bedtime.     . polyethylene glycol (MIRALAX / GLYCOLAX) 17 g packet Take 17 g by mouth daily. 14 each 0  . PREVIDENT 5000 BOOSTER PLUS 1.1 % PSTE Place 1 application onto teeth in the morning and at bedtime.     . sertraline (ZOLOFT) 100 MG tablet Take 100 mg by mouth daily.     No current facility-administered medications on file prior to visit.    Allergies:  No Known Allergies    OBJECTIVE:  Physical Exam  Vitals:   10/25/20 0940  BP: 122/74  Pulse: 86  Weight: (!) 325 lb (147.4 kg)  Height: 6\' 3"  (1.905 m)   Body mass index is 40.62 kg/m. No exam data present   General: well developed, well nourished, pleasant elderly Caucasian male, seated, in no evident distress Head: head normocephalic and atraumatic.   Neck: supple with no carotid or supraclavicular bruits Cardiovascular: regular rate and rhythm, no murmurs   Neurologic Exam Mental Status: Awake and fully alert. Fluent speech and language.  Oriented to place and time. Recent and remote memory intact. Attention span, concentration and fund of knowledge appropriate. Mood and affect appropriate.  Cranial Nerves: Pupils equal, briskly reactive to light. Extraocular movements full without nystagmus. Visual fields full to  confrontation. Hearing intact. Facial sensation intact. Face, tongue, palate moves normally and symmetrically.  Motor: Normal bulk and tone. Normal strength in all tested extremity muscles. Sensory.: intact to touch , pinprick , position and vibratory sensation.  Coordination: Rapid alternating movements normal in all extremities. Finger-to-nose and heel-to-shin performed accurately bilaterally. Gait and Station: Arises from chair without difficulty. Stance is normal. Gait demonstrates  mild gait impairment secondary to bilateral chronic knee pain but able to ambulate without assistive device Reflexes: 1+ and symmetric. Toes downgoing.       ASSESSMENT: Leroy KennedyWilliam C Tamargo is a  75 y.o. year old male presented with acute onset of ophthalmoplegia, left facial droop and dysarthria on 12/03/2019 with stroke work-up revealing right BG/CR small scattered infarcts likely secondary to right CCA mixed plaque.  Noted that A. fib could be potential etiology but he is on Eliquis and stroke formation notable for A. fib related infarct.  Recommended adding aspirin in addition to Eliquis at discharge.  Vascular risk factors include PAF on Eliquis, CCA stenosis, HTN, HLD and DM.  Evaluated for sleep apnea which was negative.     PLAN:  1. Right BG/CR stroke:  a. Recovered well without residual deficits b. Continue aspirin 81 mg daily and Eliquis (apixaban) daily  and atorvastatin 80 mg daily for secondary stroke prevention.  c. Discussed secondary stroke prevention measures and importance of close PCP follow-up for aggressive stroke risk factor management 2. Atrial fibrillation:  a. On Eliquis 5 mg twice daily with CHA2DS2-VASc score of at least 5 (age, HTN, stroke, DM)  b. S/p successful ablation 03/17/2020 c. Routinely monitored by cardiology 3. R carotid plaque: Recommend obtaining carotid ultrasound for surveillance monitoring - he wishes to hold off at this time as he has been undergoing increased  stressors and will call office when he is ready to pursue.  Discussed monitoring for reoccurring or new stroke/TIA symptoms and calling 911 immediately if this should occur 4. HTN:  a. BP goal<130/90.  Stable today. Managed by cardiology 5. HLD:  a. LDL goal<70.  LDL 81 (05/2020). On atorvastatin 80 mg daily. F/u with PCP for ongoing monitoring and management 6. DMII:  a. A1c goal<7.  Stable per patient. Managed by endocrinology    Follow-up in 6 months or call earlier if needed   CC:  [facility] provider: Dr. Henrietta Hoover, Zollie Beckers, MD    I spent 30 minutes of face-to-face and non-face-to-face time with patient.  This included previsit chart review, lab review, study review, order entry, electronic health record documentation, patient education regarding prior stroke, importance of managing stroke risk factors, and answered all other questions to patient satisfaction  Ihor Austin, [facility]  [facility] [address] [place], [place] [address]  Phone [phone] Fax [phone] Note: This document was prepared with digital dictation and possible smart phrase technology. Any transcriptional errors that result from this process are unintentional.

## 2020-11-05 NOTE — Progress Notes (Signed)
I agree with the above plan 

## 2020-11-23 IMAGING — MR MR HEAD W/O CM
12 of 14 series · 34 of 48 positions shown · non-contrast
Comparison: Head CT and CTA 12/03/2019

CLINICAL DATA: TIA. Left-sided weakness and facial droop.

EXAM:
MRI HEAD WITHOUT CONTRAST
MRA HEAD WITHOUT CONTRAST
TECHNIQUE: Multiplanar, multiecho pulse sequences of the brain and surrounding
structures were obtained without intravenous contrast. Angiographic
images of the head were obtained using MRA technique without
contrast.

[Series 9: DWI · axial · 3.0mm · 0.88mm/px · z∈[-25,+120]mm · 7 of 102 slices shown (1 of 4)]
[im 1/102]
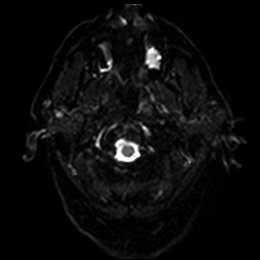
[im 17/102]
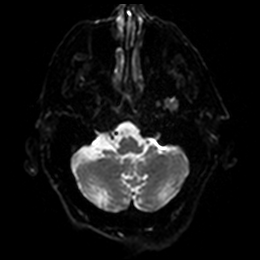
[im 34/102]
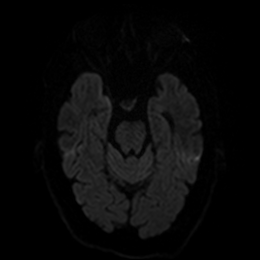
[im 51/102]
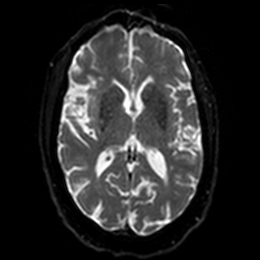
[im 68/102]
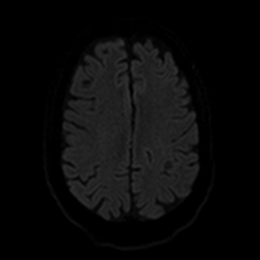
[im 85/102]
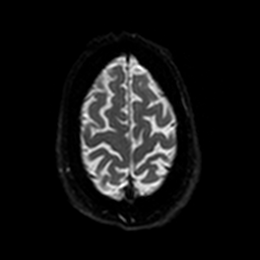
[im 102/102]
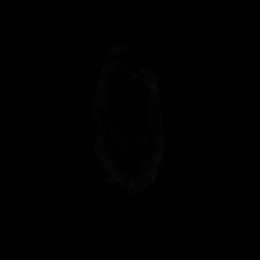

[Series 10: DWI · axial · 3.0mm · 0.88mm/px · z∈[-25,+120]mm · 3 of 51 slices shown (2 of 4)]
[im 1/51]
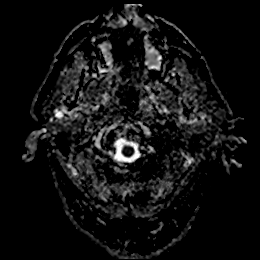
[im 26/51]
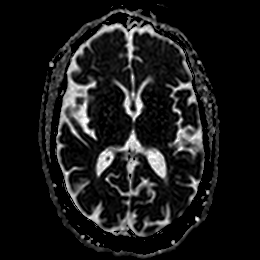
[im 51/51]
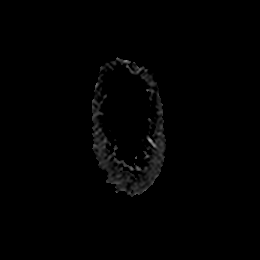

[Series 11: DWI · coronal · 4.0mm · 0.88mm/px · 5 of 78 slices shown (3 of 4)]
[im 1/78]
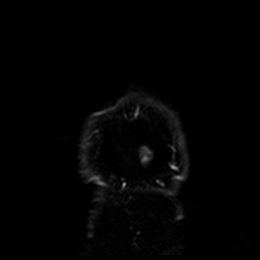
[im 20/78]
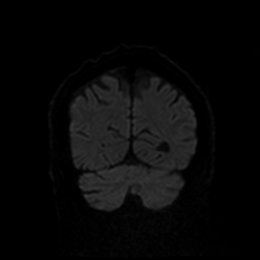
[im 39/78]
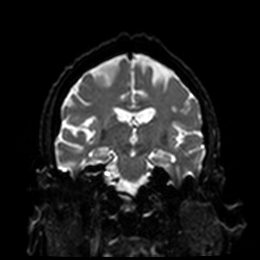
[im 58/78]
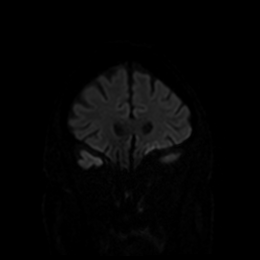
[im 78/78]
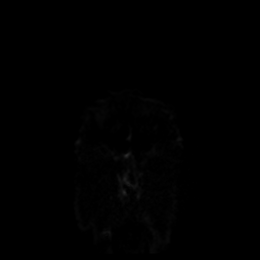

[Series 12: DWI · coronal · 4.0mm · 0.88mm/px · 2 of 39 slices shown (4 of 4)]
[im 1/39]
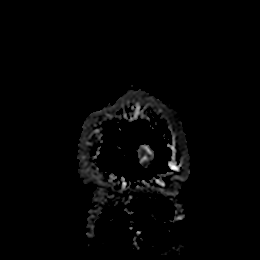
[im 39/39]
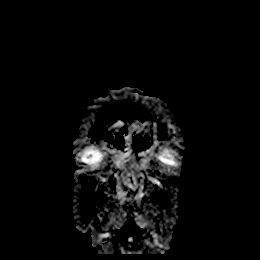

[Series 13: T1 · sagittal · 5.0mm · 0.75mm/px · 1 of 23 slices shown]
[im 1/23]
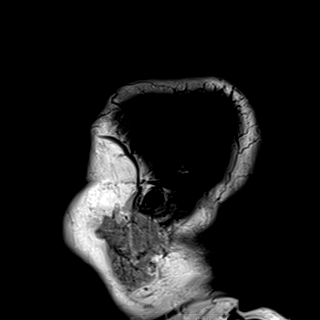

[Series 14: T2 · axial · 5.0mm · 0.72mm/px · 1 of 25 slices shown (1 of 2)]
[im 1/25]
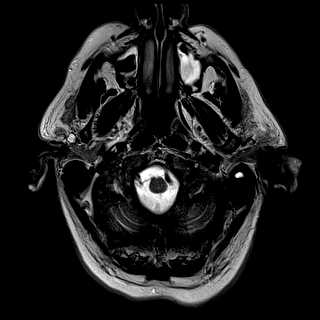

[Series 15: FLAIR · axial · 5.0mm · 0.45mm/px · 1 of 25 slices shown]
[im 1/25]
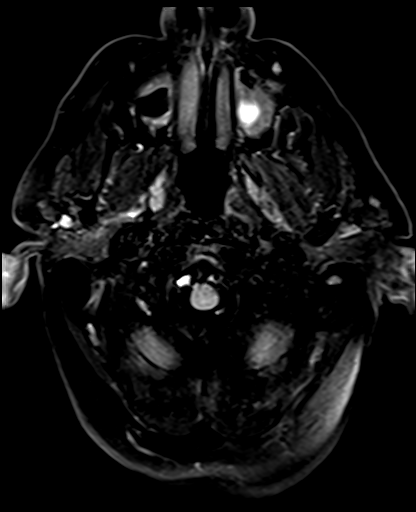

[Series 16: mag_images · axial · 3.0mm · 0.90mm/px · z∈[-35,+125]mm · 3 of 56 slices shown]
[im 1/56]
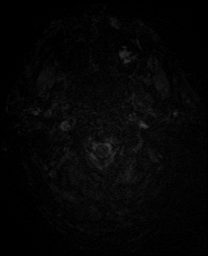
[im 28/56]
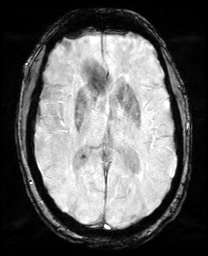
[im 56/56]
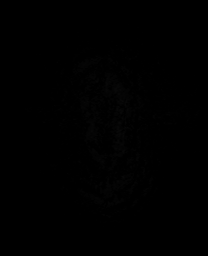

[Series 17: pha_images · axial · 3.0mm · 0.90mm/px · z∈[-35,+122]mm · 3 of 55 slices shown]
[im 1/55]
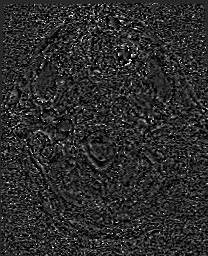
[im 28/55]
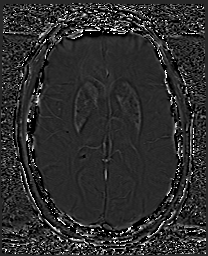
[im 55/55]
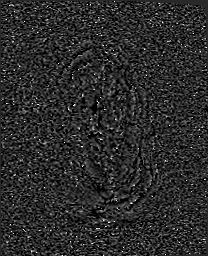

[Series 18: swi_images · axial · 3.0mm · 0.90mm/px · z∈[-35,+125]mm · 3 of 56 slices shown]
[im 1/56]
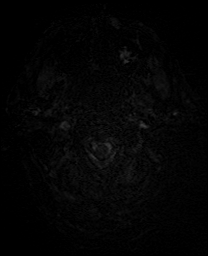
[im 28/56]
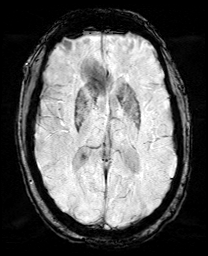
[im 56/56]
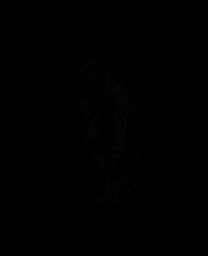

[Series 19: mip_images(sw) · axial · 24.0mm · 0.90mm/px · z∈[-25,+115]mm · 3 of 49 slices shown]
[im 1/49]
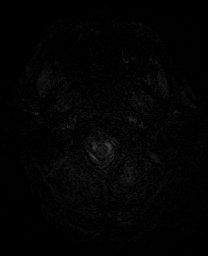
[im 25/49]
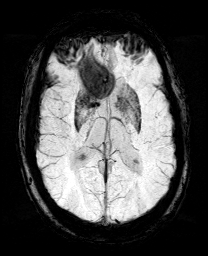
[im 49/49]
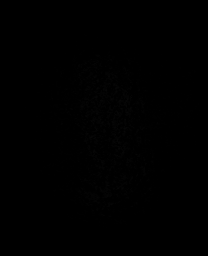

[Series 21: T2 · coronal · 5.0mm · 0.34mm/px · 2 of 33 slices shown (2 of 2)]
[im 1/33]
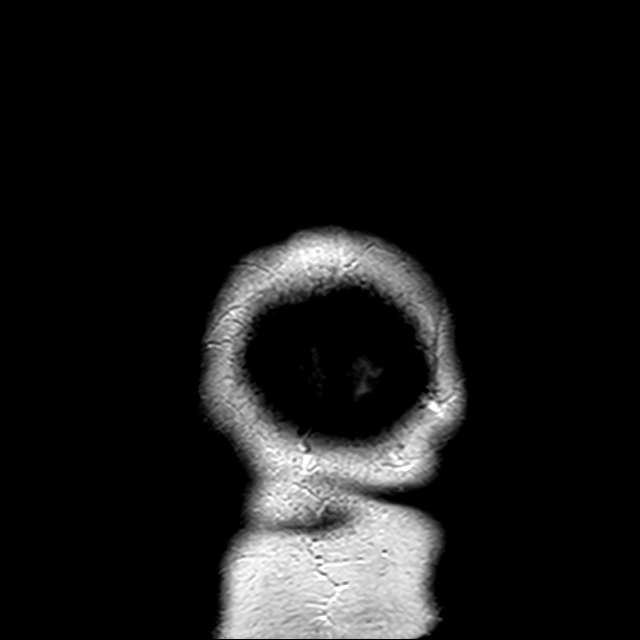
[im 33/33]
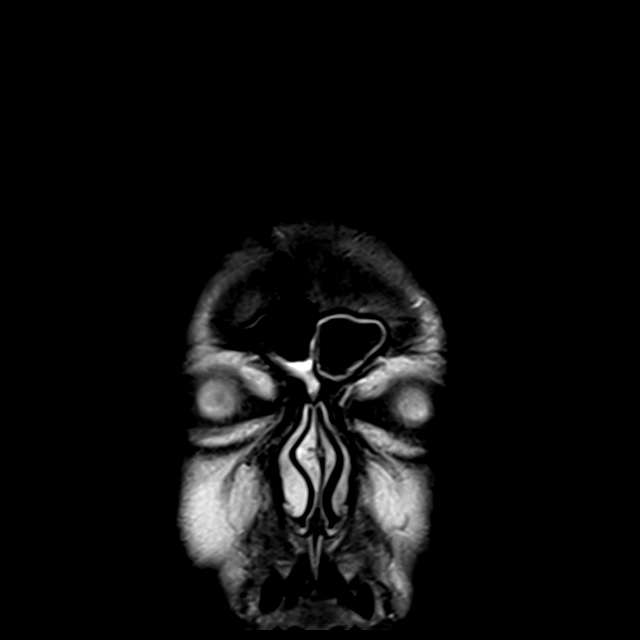

[34 of 48 positions shown; findings below may reference images not displayed]

FINDINGS: MRI HEAD FINDINGS

Brain: There is a small acute infarct involving the right corona
radiata and posterior right lentiform nucleus. T2 hyperintensities
in the cerebral white matter bilaterally are nonspecific but
compatible with mild chronic small vessel ischemic disease. Mild
cerebral atrophy is within normal limits for age. No intracranial
hemorrhage, mass, midline shift, or extra-axial fluid collection is
identified.

Vascular: Major intracranial vascular flow voids are preserved.

Skull and upper cervical spine: Decreased bone marrow T1 signal
intensity in the skull and included upper cervical spine,
nonspecific but may be related to the patient's known anemia.

Sinuses/Orbits: Unremarkable orbits. Mild mucosal thickening in the
paranasal sinuses. Trace left mastoid fluid.

Other: None.

MRA HEAD FINDINGS

The visualized distal vertebral arteries are patent to the basilar
with a fenestration partially visualized in the right vertebral
artery, a normal variant. The left PICA and right AICA appear
dominant. Patent SCAs are seen bilaterally. The basilar artery is
widely patent. There is a moderately large right posterior
communicating artery. Both PCAs are patent without evidence of
significant proximal stenosis.

The internal carotid arteries are widely patent from skull base to
carotid termini. ACAs and MCAs are patent without evidence of
proximal branch occlusion or significant proximal stenosis. No
aneurysm is identified.
IMPRESSION: 1. Small acute posterior right basal ganglia/corona radiata infarct.
2. Mild chronic small vessel ischemic disease.
3. Negative head MRA.

## 2020-12-13 ENCOUNTER — Emergency Department (HOSPITAL_COMMUNITY)
Admission: EM | Admit: 2020-12-13 | Discharge: 2020-12-13 | Disposition: A | Discharge status: Home / Self Care | Payer: Medicare Other | Attending: Emergency Medicine | Admitting: Emergency Medicine

## 2020-12-13 ENCOUNTER — Encounter (HOSPITAL_COMMUNITY): Payer: Self-pay

## 2020-12-13 ENCOUNTER — Telehealth: Payer: Self-pay | Admitting: Nephrology

## 2020-12-13 ENCOUNTER — Other Ambulatory Visit: Payer: Self-pay

## 2020-12-13 ENCOUNTER — Emergency Department (HOSPITAL_COMMUNITY): Payer: Medicare Other

## 2020-12-13 DIAGNOSIS — R600 Localized edema: Secondary | ICD-10-CM | POA: Insufficient documentation

## 2020-12-13 DIAGNOSIS — E1169 Type 2 diabetes mellitus with other specified complication: Secondary | ICD-10-CM | POA: Insufficient documentation

## 2020-12-13 DIAGNOSIS — E860 Dehydration: Secondary | ICD-10-CM | POA: Diagnosis not present

## 2020-12-13 DIAGNOSIS — Z7901 Long term (current) use of anticoagulants: Secondary | ICD-10-CM | POA: Diagnosis not present

## 2020-12-13 DIAGNOSIS — J4540 Moderate persistent asthma, uncomplicated: Secondary | ICD-10-CM | POA: Insufficient documentation

## 2020-12-13 DIAGNOSIS — N200 Calculus of kidney: Secondary | ICD-10-CM | POA: Diagnosis not present

## 2020-12-13 DIAGNOSIS — Z794 Long term (current) use of insulin: Secondary | ICD-10-CM | POA: Diagnosis not present

## 2020-12-13 DIAGNOSIS — K429 Umbilical hernia without obstruction or gangrene: Secondary | ICD-10-CM | POA: Diagnosis not present

## 2020-12-13 DIAGNOSIS — E11649 Type 2 diabetes mellitus with hypoglycemia without coma: Secondary | ICD-10-CM | POA: Diagnosis not present

## 2020-12-13 DIAGNOSIS — Z7982 Long term (current) use of aspirin: Secondary | ICD-10-CM | POA: Insufficient documentation

## 2020-12-13 DIAGNOSIS — I700 Atherosclerosis of aorta: Secondary | ICD-10-CM | POA: Diagnosis not present

## 2020-12-13 DIAGNOSIS — I480 Paroxysmal atrial fibrillation: Secondary | ICD-10-CM | POA: Diagnosis not present

## 2020-12-13 DIAGNOSIS — E669 Obesity, unspecified: Secondary | ICD-10-CM | POA: Insufficient documentation

## 2020-12-13 DIAGNOSIS — R1031 Right lower quadrant pain: Secondary | ICD-10-CM | POA: Diagnosis present

## 2020-12-13 DIAGNOSIS — E782 Mixed hyperlipidemia: Secondary | ICD-10-CM | POA: Insufficient documentation

## 2020-12-13 DIAGNOSIS — R1084 Generalized abdominal pain: Secondary | ICD-10-CM | POA: Diagnosis not present

## 2020-12-13 DIAGNOSIS — R0902 Hypoxemia: Secondary | ICD-10-CM | POA: Diagnosis not present

## 2020-12-13 DIAGNOSIS — I10 Essential (primary) hypertension: Secondary | ICD-10-CM | POA: Insufficient documentation

## 2020-12-13 DIAGNOSIS — R1030 Lower abdominal pain, unspecified: Secondary | ICD-10-CM | POA: Diagnosis not present

## 2020-12-13 DIAGNOSIS — N132 Hydronephrosis with renal and ureteral calculous obstruction: Secondary | ICD-10-CM | POA: Diagnosis not present

## 2020-12-13 LAB — CBC
HCT: 41.8 % (ref 39.0–52.0)
Hemoglobin: 13.5 g/dL (ref 13.0–17.0)
MCH: 30.5 pg (ref 26.0–34.0)
MCHC: 32.3 g/dL (ref 30.0–36.0)
MCV: 94.4 fL (ref 80.0–100.0)
Platelets: 220 10*3/uL (ref 150–400)
RBC: 4.43 MIL/uL (ref 4.22–5.81)
RDW: 13.7 % (ref 11.5–15.5)
WBC: 14 10*3/uL — ABNORMAL HIGH (ref 4.0–10.5)
nRBC: 0 % (ref 0.0–0.2)

## 2020-12-13 LAB — COMPREHENSIVE METABOLIC PANEL
ALT: 16 U/L (ref 0–44)
AST: 22 U/L (ref 15–41)
Albumin: 3.8 g/dL (ref 3.5–5.0)
Alkaline Phosphatase: 83 U/L (ref 38–126)
Anion gap: 13 (ref 5–15)
BUN: 19 mg/dL (ref 8–23)
CO2: 25 mmol/L (ref 22–32)
Calcium: 9.5 mg/dL (ref 8.9–10.3)
Chloride: 105 mmol/L (ref 98–111)
Creatinine, Ser: 1.79 mg/dL — ABNORMAL HIGH (ref 0.61–1.24)
GFR, Estimated: 40 mL/min — ABNORMAL LOW (ref >60)
Glucose, Bld: 163 mg/dL — ABNORMAL HIGH (ref 70–99)
Potassium: 3.5 mmol/L (ref 3.5–5.1)
Sodium: 143 mmol/L (ref 135–145)
Total Bilirubin: 1.1 mg/dL (ref 0.3–1.2)
Total Protein: 7.2 g/dL (ref 6.5–8.1)

## 2020-12-13 LAB — URINALYSIS, ROUTINE W REFLEX MICROSCOPIC
Bilirubin Urine: NEGATIVE
Glucose, UA: NEGATIVE mg/dL
Ketones, ur: NEGATIVE mg/dL
Leukocytes,Ua: NEGATIVE
Nitrite: NEGATIVE
Protein, ur: 30 mg/dL — AB
Specific Gravity, Urine: 1.019 (ref 1.005–1.030)
pH: 5 (ref 5.0–8.0)

## 2020-12-13 MED ORDER — SODIUM CHLORIDE 0.9 % IV BOLUS
500.0000 mL | Freq: Once | INTRAVENOUS | Status: AC
  Administered 2020-12-13: 500 mL via INTRAVENOUS

## 2020-12-13 MED ORDER — ONDANSETRON 4 MG PO TBDP
4.0000 mg | ORAL_TABLET | Freq: Three times a day (TID) | ORAL | 0 refills | Status: DC | PRN
Start: 2020-12-13 — End: 2021-05-10

## 2020-12-13 NOTE — ED Notes (Signed)
Pt ambulated well to the bathroom  

## 2020-12-13 NOTE — ED Notes (Signed)
Patient transported to CT 

## 2020-12-13 NOTE — ED Triage Notes (Signed)
Right flank pain started yesterday with nausea and vomiting today. Hx of kidney stones.

## 2020-12-13 NOTE — Telephone Encounter (Addendum)
Received page via our answer service from [name]. Has been experiencing right sided abd pain. Reported as pain in his flank radiating to his groin. Has not been able to urinate but does have the feeling of urgency. He also reports constipation, diminished appetite, and nausea/vomiting. Instructed him to proceed to the ER to expedite a workup. It is possible that he may be having nephrolithiasis however it is difficult to ascertain this over the phone. Patient reports that he lives alone and does not have a ride therefore prefers to call 911 which is reasonable given the severity of his symptoms. Patient follows with Dr. Kathrene Bongo. Baseline Cr seems to be around 1.7. If any questions/concerns, please contact the covering nephrologist.  Anthony Sar, MD [facility]

## 2020-12-13 NOTE — ED Triage Notes (Signed)
Emergency Medicine Provider Triage Evaluation Note  Brandon Sanchez , a 75 y.o. male  was evaluated in triage.  Pt complains of feeling unwell. Difficulty urinating and BM. Recently moved in the last few days. Little PO intake. Some flank pain, lower right. NBNB emesis. Hx of kidney stones, sees urology. No hx of aneurysm, AAA that patient is aware of. RLQ pain.  Review of Systems  Positive: Flank pain, emesis, difficulty urinating Negative: HA, paresthesias, weakness  Physical Exam  Ht 6\' 3"  (1.905 m)   Wt (!) 147 kg   BMI 40.51 kg/m  Gen:   Awake, no distress   HEENT:  Atraumatic  Resp:  Normal effort  Cardiac:  Normal rate  Abd:   Nondistended, nontender  MSK:   Moves extremities without difficulty  Neuro:  Speech clear   Medical Decision Making  Medically screening exam initiated at 6:39 AM.  Appropriate orders placed.  was informed that the remainder of the evaluation will be completed by another provider, this initial triage assessment does not replace that evaluation, and the importance of remaining in the ED until their evaluation is complete.  Clinical Impression  Flank pain, emesis, difficulty urinating    Dorothy Polhemus A, PA-C 12/13/20 [phone]

## 2020-12-13 NOTE — Discharge Instructions (Signed)
As discussed you have been diagnosed with a kidney stone and mild dehydration.  It is important to stay aware of your fluid intake.  If you develop new, or concerning changes such as an inability to eat or drink, fever that does not improve with Tylenol, or inability to urinate, do not hesitate to return here.  Otherwise follow-up with your physician.

## 2020-12-13 NOTE — ED Notes (Signed)
Discharge was delayed due to moving patient in the hallway, sicker patient needed his room .

## 2020-12-13 NOTE — ED Provider Notes (Signed)
[facility] Provider Note   CSN: [id number] Arrival date & time: 12/13/20  0601     History Chief Complaint  Patient presents with  . Flank Pain    [facility] is a 75 y.o. male.  HPI Patient with multiple medical issues including nephrolithiasis, CKD, no abdominal surgery now presents with 3 days of abdominal pain.  Onset was without clear precipitant.  Since that time he has had episodes of abdominal discomfort, largely in the right lower area, though with some generalized pain. Last attempted p.o. intake was water, which resulted in emesis.  Prior to that the patient attempted eating a sandwich last night which also resulted in emesis.  Decreased bowel movements, increased sense of need to urinate. No fever.  No relief with anything, and as above, oral intake seems to make symptoms worse.     Past Medical History:  Diagnosis Date  . Allergic rhinitis, cause unspecified   . Asthma   . Cataract cortical, senile, bilateral   . Macular degeneration of left eye   . Other and unspecified hyperlipidemia   . Type II or unspecified type diabetes mellitus without mention of complication, not stated as uncontrolled   . Unspecified essential hypertension     Patient Active Problem List   Diagnosis Date Noted  . Persistent atrial fibrillation (HCC) 04/14/2020  . History of CVA (cerebrovascular accident) 12/27/2019  . Bilateral leg edema 12/27/2019  . Anemia 12/19/2019  . Basal ganglia stroke (HCC) 12/10/2019  . Left-sided weakness   . Slow transit constipation   . Pain in toes of both feet   . Cerebral embolism with cerebral infarction 12/04/2019  . CVA (cerebral vascular accident) (HCC) 12/04/2019  . Acute left-sided weakness 12/03/2019  . Mixed diabetic hyperlipidemia associated with type 2 diabetes mellitus (HCC) 12/03/2019  . Paroxysmal atrial fibrillation (HCC)   . Secondary hypercoagulable state (HCC) 09/28/2019  . Atrial  fibrillation with rapid ventricular response (HCC) 09/20/2019  . CAP (community acquired pneumonia) 09/20/2019  . Obesity 05/12/2015  . Uncontrolled type 2 diabetes mellitus with hypoglycemia without coma, with long-term current use of insulin (HCC) 02/04/2008  . HLD (hyperlipidemia) 02/04/2008  . Essential hypertension 02/04/2008  . ALLERGIC RHINITIS 02/04/2008  . 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 02/04/2008    Past Surgical History:  Procedure Laterality Date  . ATRIAL FIBRILLATION ABLATION N/A 03/17/2020   Procedure: ATRIAL FIBRILLATION ABLATION;  Surgeon: Regan Lemming, MD;  [facility];  Service: Cardiovascular;  Laterality: N/A;  . CARDIOVERSION N/A 09/22/2019   Procedure: CARDIOVERSION;  Surgeon: [name], MD;  Location[facility] END[facility];  Service: Endoscopy;  Laterality: N/A;  . CARDIOVERSION N/A 10/18/2019   Procedure: CARDIOVERSION;  Surgeon: Jake Bathe, MD;  Location: [facility]OSCOPY;  Service: Cardiovascular;  Laterality: N/A;  . CATARACT EXTRACTION, BILATERAL  08/2020  . TEE WITHOUT CARDIOVERSION N/A 09/22/2019   Procedure: TRANSESOPHAGEAL ECHOCARDIOGRAM (TEE);  Surgeon: [name], MD;  Location: [facility] ENDOSCOPY;  Service: Endoscopy;  Laterality: N/A;       Family History  Problem Relation Age of Onset  . Colon cancer Father   . Heart disease Paternal Grandfather     Social History   Tobacco Use  . Smoking status: Never Smoker  . Smokeless tobacco: Never Used  Substance Use Topics  . Alcohol use: Not Currently    Comment: Used to drink 2 L bottle of wine/day. Quit 09/18/19  . Drug use: Never  Home Medications Prior to Admission medications   Medication Sig Start Date End Date Taking? Authorizing Provider  albuterol (PROAIR HFA) 108 (90 BASE) MCG/ACT inhaler 2 puffs every 4 hours as needed only  if your can't catch your breath 05/12/15   Nyoka Cowden, MD  aspirin EC 81 MG EC tablet Take 1  tablet (81 mg total) by mouth daily. 12/11/19   Rolly Salter, MD  atorvastatin (LIPITOR) 80 MG tablet Take 1 tablet (80 mg total) by mouth every evening. 12/17/19   Love, Evlyn Kanner, PA-C  B-D ULTRAFINE III SHORT PEN 31G X 8 MM MISC SMARTSIG:Pre-Filled Pen Syringe SUB-Q 3 Times Daily 04/05/20   [provider]  BREO ELLIPTA 200-25 MCG/INH AEPB Inhale 1 puff into the lungs daily as needed (shortness of breath).  08/24/19   [provider]  ELIQUIS 5 MG TABS tablet TAKE 1 TABLET(5 MG) BY MOUTH TWICE DAILY 05/24/20   Fenton, Clint R, PA  furosemide (LASIX) 40 MG tablet Take 1 tablet (40 mg total) by mouth daily. 02/09/20   Jodelle Red, MD  Multiple Vitamin (MULTIVITAMIN WITH MINERALS) TABS tablet Take 1 tablet by mouth daily.    [provider]  MYRBETRIQ 25 MG TB24 tablet Take 25 mg by mouth daily. 01/06/20   [provider]  NOVOLOG MIX 70/30 FLEXPEN (70-30) 100 UNIT/ML FlexPen Inject 30 Units into the skin in the morning and at bedtime. 05/23/20   [provider]  omeprazole (PRILOSEC) 40 MG capsule Take 40 mg by mouth daily as needed (heart burn).    [provider]  [facility] VERIO test strip 1 each by Other route in the morning, at noon, and at bedtime.  07/26/19   [provider]  polyethylene glycol (MIRALAX / GLYCOLAX) 17 g packet Take 17 g by mouth daily. 12/11/19   Rolly Salter, MD  PREVIDENT 5000 BOOSTER PLUS 1.1 % PSTE Place 1 application onto teeth in the morning and at bedtime.  04/13/19   [provider]  sertraline (ZOLOFT) 100 MG tablet Take 100 mg by mouth daily.    [provider]    Allergies    Patient has no known allergies.  Review of Systems   Review of Systems  Constitutional:       Per HPI, otherwise negative  HENT:       Per HPI, otherwise negative  Respiratory:       Per HPI, otherwise negative  Cardiovascular:       Per HPI, otherwise negative  Gastrointestinal: Positive for  abdominal pain, nausea and vomiting.  Endocrine:       Negative aside from HPI  Genitourinary:       Neg aside from HPI   Musculoskeletal:       Per HPI, otherwise negative  Skin: Negative.   Neurological: Negative for syncope.    Physical Exam Updated Vital Signs BP (!) 158/65   Pulse 85   Temp 98.1 F (36.7 C) (Axillary)   Resp (!) 24   Ht 6\' 3"  (1.905 m)   Wt (!) 147 kg   SpO2 95%   BMI 40.51 kg/m   Physical Exam Vitals and nursing note reviewed.  Constitutional:      General: He is not in acute distress.    Appearance: He is well-developed. He is obese.  HENT:     Head: Normocephalic and atraumatic.  Eyes:     Conjunctiva/sclera: Conjunctivae normal.  Cardiovascular:     Rate and Rhythm:  Normal rate and regular rhythm.  Pulmonary:     Effort: Pulmonary effort is normal. No respiratory distress.     Breath sounds: No stridor.  Abdominal:     General: There is no distension.     Tenderness: There is abdominal tenderness. There is guarding.     Comments: Right lower tenderness  Musculoskeletal:     Right lower leg: Edema present.     Left lower leg: Edema present.  Skin:    General: Skin is warm and dry.  Neurological:     Mental Status: He is alert and oriented to person, place, and time.     ED Results / Procedures / Treatments   Labs (all labs ordered are listed, but only abnormal results are displayed) Labs Reviewed  CBC - Abnormal; Notable for the following components:      Result Value   WBC 14.0 (*)    All other components within normal limits  COMPREHENSIVE METABOLIC PANEL - Abnormal; Notable for the following components:   Glucose, Bld 163 (*)    Creatinine, Ser 1.79 (*)    GFR, Estimated 40 (*)    All other components within normal limits  URINALYSIS, ROUTINE W REFLEX MICROSCOPIC - Abnormal; Notable for the following components:   APPearance HAZY (*)    Hgb urine dipstick LARGE (*)    Protein, ur 30 (*)    Bacteria, UA RARE (*)    All  other components within normal limits     Radiology CT Abdomen Pelvis Wo Contrast  Result Date: 12/13/2020 CLINICAL DATA:  Acute right flank pain. EXAM: CT ABDOMEN AND PELVIS WITHOUT CONTRAST TECHNIQUE: Multidetector CT imaging of the abdomen and pelvis was performed following the standard protocol without IV contrast. COMPARISON:  Dec 29, 2019. FINDINGS: Lower chest: No acute abnormality. Hepatobiliary: No focal liver abnormality is seen. No gallstones, gallbladder wall thickening, or biliary dilatation. Pancreas: Unremarkable. No pancreatic ductal dilatation or surrounding inflammatory changes. Spleen: Normal in size without focal abnormality. Adrenals/Urinary Tract: Adrenal glands appear normal. Nonobstructive left nephrolithiasis is noted. Mild right hydroureteronephrosis is noted secondary to 3 mm calculus at right ureterovesical junction. Mild perinephric stranding is noted. Urinary bladder is decompressed. Stomach/Bowel: Stomach is within normal limits. Appendix appears normal. No evidence of bowel wall thickening, distention, or inflammatory changes. Vascular/Lymphatic: Aortic atherosclerosis. No enlarged abdominal or pelvic lymph nodes. Reproductive: Prostate is unremarkable. Other: Small fat containing periumbilical hernia is noted. No ascites is noted. Musculoskeletal: No acute or significant osseous findings. IMPRESSION: Mild right hydroureteronephrosis is noted secondary to 3 mm calculus at right ureterovesical junction. Nonobstructive left nephrolithiasis. Small fat containing periumbilical hernia. Aortic Atherosclerosis (ICD10-I70.0). Electronically Signed   By: Lupita RaiderJames  Green Jr M.D.   On: 12/13/2020 10:46    Procedures Procedures   Medications Ordered in ED Medications  sodium chloride 0.9 % bolus 500 mL (0 mLs Intravenous Stopped 12/13/20 1118)    ED Course  I have reviewed the triage vital signs and the nursing notes.  Pertinent labs & imaging results that were available during  my care of the patient were reviewed by me and considered in my medical decision making (see chart for details).   Update: Patient awake and alert, in no distress.  He and I discussed his CT findings at length.    12:03 PM Urinalysis with hematuria, no evidence for infection.  Patient does have mild leukocytosis, but is afebrile, low suspicion for concurrent infection, high suspicion for 3 mm stone contributing to his symptoms. No  evidence for other GI processes, obstruction.  Patient and I discussed kidney stones, likely course of disease, need for follow-up.  Patient appropriate for discharge.  12:28 PM Patient and I again discussed his evaluation today, and he demonstrated his lab progression on monitor in his room.  Specifically discussed dehydration, need for fluid resuscitation, and return precautions. Patient discharged in stable condition. MDM Rules/Calculators/A&P MDM Number of Diagnoses or Management Options Dehydration: new, needed workup Nephrolithiasis: new, needed workup   Amount and/or Complexity of Data Reviewed Clinical lab tests: reviewed and ordered Tests in the radiology section of CPT: ordered and reviewed Tests in the medicine section of CPT: ordered and reviewed Decide to obtain previous medical records or to obtain history from someone other than the patient: yes Review and summarize past medical records: yes Independent visualization of images, tracings, or specimens: no  Risk of Complications, Morbidity, and/or Mortality Presenting problems: high Diagnostic procedures: high Management options: high  Critical Care Total time providing critical care: < 30 minutes  Patient Progress Patient progress: improved  Final Clinical Impression(s) / ED Diagnoses Final diagnoses:  Nephrolithiasis  Dehydration    Rx / DC Orders ED Discharge Orders         Ordered    ondansetron (ZOFRAN ODT) 4 MG disintegrating tablet  Every 8 hours PRN        12/13/20 1230            Gerhard Munch, MD 12/13/20 1234

## 2020-12-21 ENCOUNTER — Other Ambulatory Visit (HOSPITAL_COMMUNITY): Payer: Self-pay | Admitting: Physician Assistant

## 2020-12-21 DIAGNOSIS — I10 Essential (primary) hypertension: Secondary | ICD-10-CM | POA: Diagnosis not present

## 2020-12-21 DIAGNOSIS — E7801 Familial hypercholesterolemia: Secondary | ICD-10-CM | POA: Diagnosis not present

## 2020-12-21 DIAGNOSIS — E1129 Type 2 diabetes mellitus with other diabetic kidney complication: Secondary | ICD-10-CM | POA: Diagnosis not present

## 2020-12-21 NOTE — Telephone Encounter (Signed)
Eliquis 5mg  refill request received. Patient is 75 years old, weight-147kg, Crea-1.79 on 12/13/2020, Diagnosis-Afib, and last seen by Dr. 12/15/2020 on 10/02/2020. Dose is appropriate based on dosing criteria. Will send in refill to requested pharmacy.

## 2020-12-26 DIAGNOSIS — N200 Calculus of kidney: Secondary | ICD-10-CM | POA: Diagnosis not present

## 2020-12-26 DIAGNOSIS — Z8673 Personal history of transient ischemic attack (TIA), and cerebral infarction without residual deficits: Secondary | ICD-10-CM | POA: Diagnosis not present

## 2020-12-26 DIAGNOSIS — E1129 Type 2 diabetes mellitus with other diabetic kidney complication: Secondary | ICD-10-CM | POA: Diagnosis not present

## 2020-12-26 DIAGNOSIS — I10 Essential (primary) hypertension: Secondary | ICD-10-CM | POA: Diagnosis not present

## 2020-12-26 DIAGNOSIS — J4540 Moderate persistent asthma, uncomplicated: Secondary | ICD-10-CM | POA: Diagnosis not present

## 2020-12-26 DIAGNOSIS — N1831 Chronic kidney disease, stage 3a: Secondary | ICD-10-CM | POA: Diagnosis not present

## 2020-12-26 DIAGNOSIS — Z7901 Long term (current) use of anticoagulants: Secondary | ICD-10-CM | POA: Diagnosis not present

## 2020-12-26 DIAGNOSIS — E782 Mixed hyperlipidemia: Secondary | ICD-10-CM | POA: Diagnosis not present

## 2021-01-11 DIAGNOSIS — N1832 Chronic kidney disease, stage 3b: Secondary | ICD-10-CM | POA: Diagnosis not present

## 2021-01-11 DIAGNOSIS — N200 Calculus of kidney: Secondary | ICD-10-CM | POA: Diagnosis not present

## 2021-01-11 DIAGNOSIS — I129 Hypertensive chronic kidney disease with stage 1 through stage 4 chronic kidney disease, or unspecified chronic kidney disease: Secondary | ICD-10-CM | POA: Diagnosis not present

## 2021-01-11 DIAGNOSIS — E1122 Type 2 diabetes mellitus with diabetic chronic kidney disease: Secondary | ICD-10-CM | POA: Diagnosis not present

## 2021-01-18 DIAGNOSIS — E039 Hypothyroidism, unspecified: Secondary | ICD-10-CM | POA: Diagnosis not present

## 2021-01-18 DIAGNOSIS — I10 Essential (primary) hypertension: Secondary | ICD-10-CM | POA: Diagnosis not present

## 2021-01-18 DIAGNOSIS — E1129 Type 2 diabetes mellitus with other diabetic kidney complication: Secondary | ICD-10-CM | POA: Diagnosis not present

## 2021-01-18 DIAGNOSIS — E785 Hyperlipidemia, unspecified: Secondary | ICD-10-CM | POA: Diagnosis not present

## 2021-02-28 DIAGNOSIS — N1832 Chronic kidney disease, stage 3b: Secondary | ICD-10-CM | POA: Diagnosis not present

## 2021-04-19 DIAGNOSIS — E1129 Type 2 diabetes mellitus with other diabetic kidney complication: Secondary | ICD-10-CM | POA: Diagnosis not present

## 2021-04-26 ENCOUNTER — Ambulatory Visit (INDEPENDENT_AMBULATORY_CARE_PROVIDER_SITE_OTHER): Payer: Medicare Other | Admitting: Cardiology

## 2021-04-26 ENCOUNTER — Other Ambulatory Visit: Payer: Self-pay

## 2021-04-26 ENCOUNTER — Encounter: Payer: Self-pay | Admitting: Cardiology

## 2021-04-26 VITALS — BP 122/74 | HR 94 | Ht 74.0 in | Wt 331.2 lb

## 2021-04-26 DIAGNOSIS — I4819 Other persistent atrial fibrillation: Secondary | ICD-10-CM

## 2021-04-26 NOTE — Progress Notes (Signed)
Electrophysiology Office Note   Date:  04/26/2021   ID:  Tam, Delisle 1945-10-09, MRN [id number]  PCP:  Merri Brunette, MD  Cardiologist:  Cristal Deer Primary Electrophysiologist:  Jailen Lung Jorja Loa, MD    Chief Complaint: AF   History of Present Illness: ZOHAR MARONEY is a 75 y.o. male who is being seen today for the evaluation of AF at the request of Merri Brunette, MD. Presenting today for electrophysiology evaluation.  He has a history significant for asthma, type 2 diabetes, hypertension, hyperlipidemia.  He presented to the hospital 09/19/2019 with rapid atrial fibrillation.  He underwent TEE and cardioversion and was loaded on amiodarone.  He is now status post atrial fibrillation ablation 03/17/2020.  Today, denies symptoms of palpitations, chest pain, shortness of breath, orthopnea, PND, lower extremity edema, claudication, dizziness, presyncope, syncope, bleeding, or neurologic sequela. The patient is tolerating medications without difficulties.  Since being seen he has done well.  He has noted no further episodes of atrial fibrillation.  He is able do all of his daily activities.  Despite this, he does note some shortness of breath with exertion.  When he climbs stairs he gets short of breath, as well as when he walks up hills.  He is comfortable at rest.  He was screened for sleep apnea approximately a year ago, and was found to have mild sleep apnea, though CPAP was not recommended.  He has gained a little bit of weight over the last few months, and is working with his endocrinologist to get his blood sugar under better control.   Past Medical History:  Diagnosis Date   Allergic rhinitis, cause unspecified    Asthma    Cataract cortical, senile, bilateral    Macular degeneration of left eye    Other and unspecified hyperlipidemia    Type II or unspecified type diabetes mellitus without mention of complication, not stated as uncontrolled    Unspecified essential  hypertension    Past Surgical History:  Procedure Laterality Date   ATRIAL FIBRILLATION ABLATION N/A 03/17/2020   Procedure: ATRIAL FIBRILLATION ABLATION;  Surgeon: Regan Lemming, MD;  Location: [facility] INVASIVE CV LAB;  Service: Cardiovascular;  Laterality: N/A;   CARDIOVERSION N/A 09/22/2019   Procedure: CARDIOVERSION;  Surgeon: [name], MD;  Location: [facility] ENDOSCOPY;  Service: Endoscopy;  Laterality: N/A;   CARDIOVERSION N/A 10/18/2019   Procedure: CARDIOVERSION;  Surgeon: Jake Bathe, MD;  Location: [facility] ENDOSCOPY;  Service: Cardiovascular;  Laterality: N/A;   CATARACT EXTRACTION, BILATERAL  08/2020   TEE WITHOUT CARDIOVERSION N/A 09/22/2019   Procedure: TRANSESOPHAGEAL ECHOCARDIOGRAM (TEE);  Surgeon: [name], MD;  Location: [facility] ENDOSCOPY;  Service: Endoscopy;  Laterality: N/A;     Current Outpatient Medications  Medication Sig Dispense Refill   albuterol (PROAIR HFA) 108 (90 BASE) MCG/ACT inhaler 2 puffs every 4 hours as needed only  if your can't catch your breath 1 Inhaler 1   aspirin EC 81 MG EC tablet Take 1 tablet (81 mg total) by mouth daily. 60 tablet 0   atorvastatin (LIPITOR) 80 MG tablet Take 1 tablet (80 mg total) by mouth every evening. 30 tablet 0   B-D ULTRAFINE III SHORT PEN 31G X 8 MM MISC SMARTSIG:Pre-Filled Pen Syringe SUB-Q 3 Times Daily     BREO ELLIPTA 200-25 MCG/INH AEPB Inhale 1 puff into the lungs daily as needed (shortness of breath).      ELIQUIS 5 MG TABS tablet TAKE 1 TABLET(5 MG) BY MOUTH TWICE  DAILY 60 tablet 5   furosemide (LASIX) 40 MG tablet Take 1 tablet (40 mg total) by mouth daily. 90 tablet 3   Multiple Vitamin (MULTIVITAMIN WITH MINERALS) TABS tablet Take 1 tablet by mouth daily.     MYRBETRIQ 25 MG TB24 tablet Take 25 mg by mouth daily.     NOVOLOG MIX 70/30 FLEXPEN (70-30) 100 UNIT/ML FlexPen Inject 60 Units into the skin in the morning and at bedtime.     omeprazole (PRILOSEC) 40 MG capsule Take 40 mg by mouth daily as  needed (heart burn).     ondansetron (ZOFRAN ODT) 4 MG disintegrating tablet Take 1 tablet (4 mg total) by mouth every 8 (eight) hours as needed for nausea or vomiting. 20 tablet 0   ONETOUCH VERIO test strip 1 each by Other route in the morning, at noon, and at bedtime.      PREVIDENT 5000 BOOSTER PLUS 1.1 % PSTE Place 1 application onto teeth in the morning and at bedtime.      sertraline (ZOLOFT) 100 MG tablet Take 100 mg by mouth daily.     polyethylene glycol (MIRALAX / GLYCOLAX) 17 g packet Take 17 g by mouth daily. (Patient not taking: Reported on 04/26/2021) 14 each 0   No current facility-administered medications for this visit.    Allergies:   Patient has no known allergies.   Social History:  The patient  reports that he has never smoked. He has never used smokeless tobacco. He reports that he does not currently use alcohol. He reports that he does not use drugs.   Family History:  The patient's family history includes Colon cancer in his father; Heart disease in his paternal grandfather.   ROS:  Please see the history of present illness.   Otherwise, review of systems is positive for none.   All other systems are reviewed and negative.   PHYSICAL EXAM: VS:  BP 122/74   Pulse 94   Ht 6\' 2"  (1.88 m)   Wt (!) 331 lb 3.2 oz (150.2 kg)   SpO2 96%   BMI 42.52 kg/m  , BMI Body mass index is 42.52 kg/m. GEN: Well nourished, well developed, in no acute distress  HEENT: normal  Neck: no JVD, carotid bruits, or masses Cardiac: RRR; no murmurs, rubs, or gallops,no edema  Respiratory:  clear to auscultation bilaterally, normal work of breathing GI: soft, nontender, nondistended, + BS MS: no deformity or atrophy  Skin: warm and dry Neuro:  Strength and sensation are intact Psych: euthymic mood, full affect  EKG:  EKG is ordered today. Personal review of the ekg ordered shows sinus rhythm, rate 94  Recent Labs: 12/13/2020: ALT 16; BUN 19; Creatinine, Ser 1.79; Hemoglobin 13.5;  Platelets 220; Potassium 3.5; Sodium 143    Lipid Panel     Component Value Date/Time   CHOL 96 12/04/2019 0503   TRIG 59 12/04/2019 0503   HDL 32 (L) 12/04/2019 0503   CHOLHDL 3.0 12/04/2019 0503   VLDL 12 12/04/2019 0503   LDLCALC 52 12/04/2019 0503     Wt Readings from Last 3 Encounters:  04/26/21 (!) 331 lb 3.2 oz (150.2 kg)  12/13/20 (!) 324 lb 1.2 oz (147 kg)  10/25/20 (!) 325 lb (147.4 kg)      Other studies Reviewed: Additional studies/ records that were reviewed today include: TTE 09/20/19  Review of the above records today demonstrates:   1. Left ventricular ejection fraction, by visual estimation, is 50 to  55%. The  left ventricle has normal function. There is mildly increased  left ventricular hypertrophy.   2. Definity contrast agent was given IV to delineate the left ventricular  endocardial borders.   3. Left ventricular diastolic function could not be evaluated.   4. Mildly dilated left ventricular internal cavity size.   5. The left ventricle has no regional wall motion abnormalities.   6. Global right ventricle has normal systolic function.The right  ventricular size is mildly enlarged.   7. Left atrial size was mildly dilated.   8. Right atrial size was mildly dilated.   9. Mild mitral annular calcification.  10. Trivial mitral valve regurgitation. No evidence of mitral stenosis.  11. The tricuspid valve is normal in structure. Tricuspid valve  regurgitation is trivial.  12. The aortic valve was not well visualized. Aortic valve regurgitation  is not visualized. Mild to moderate aortic valve stenosis.  13. The pulmonic valve was not well visualized. Pulmonic valve  regurgitation is not visualized.  14. The inferior vena cava is dilated in size with <50% respiratory  variability, suggesting right atrial pressure of 15 mmHg.  15. Technically difficult; definity used; low normal LV systolic function;  mild LVH; mild LVE; calcified aortic valve with mild  to moderate AS (mean  gradient 19 mmHg); mild LAE/RAE/RVE.    ASSESSMENT AND PLAN:  1.  Persistent atrial fibrillation: Currently on Eliquis 5 mg twice daily.  He is status post atrial fibrillation ablation 03/17/2020.  He is fortunately remained in sinus rhythm.  He has noted no further palpitations.  We Parag Dorton continue with current management.  2.  Hyperlipidemia: Continue atorvastatin 80 mg per primary physician.  3.  Shortness of breath: Patient is short of breath when he exerts himself.  I think this is likely due to his morbid obesity.  He is working with his endocrinologist to get his blood sugar under better control and is trying to lose weight.  He was screened for sleep apnea, which is mild and did not require therapy.  Current medicines are reviewed at length with the patient today.   The patient does not have concerns regarding his medicines.  The following changes were made today: None  Labs/ tests ordered today include:  Orders Placed This Encounter  Procedures   EKG 12-Lead      Disposition:   FU with Alila Sotero 12 months  Signed, Euphemia Lingerfelt Jorja Loa, MD  04/26/2021 8:42 AM     [facility] [address] [place] [address] [phone] (office) [phone] (fax)

## 2021-04-26 NOTE — Patient Instructions (Signed)
Medication Instructions:  Your physician recommends that you continue on your current medications as directed. Please refer to the Current Medication list given to you today.  *If you need a refill on your cardiac medications before your next appointment, please call your pharmacy*   Lab Work: None ordered   Testing/Procedures: None ordered   Follow-Up: At [facility], you and your health needs are our priority.  As part of our continuing mission to provide you with exceptional heart care, we have created designated Provider Care Teams.  These Care Teams include your primary Cardiologist (physician) and Advanced Practice Providers (APPs -  Physician Assistants and Nurse Practitioners) who all work together to provide you with the care you need, when you need it.  Your next appointment:   6 month(s)  The format for your next appointment:   In Person  Provider:   Jodelle Red, MD   Your physician recommends that you schedule a follow-up appointment in: 1 year with Dr. Elberta Fortis    Thank you for choosing [facility]!!   Dory Horn, RN [phone]

## 2021-05-03 ENCOUNTER — Ambulatory Visit: Payer: Medicare Other | Admitting: Adult Health

## 2021-05-09 DIAGNOSIS — E1122 Type 2 diabetes mellitus with diabetic chronic kidney disease: Secondary | ICD-10-CM | POA: Diagnosis not present

## 2021-05-09 DIAGNOSIS — N200 Calculus of kidney: Secondary | ICD-10-CM | POA: Diagnosis not present

## 2021-05-09 DIAGNOSIS — N1832 Chronic kidney disease, stage 3b: Secondary | ICD-10-CM | POA: Diagnosis not present

## 2021-05-09 DIAGNOSIS — Z23 Encounter for immunization: Secondary | ICD-10-CM | POA: Diagnosis not present

## 2021-05-09 DIAGNOSIS — I129 Hypertensive chronic kidney disease with stage 1 through stage 4 chronic kidney disease, or unspecified chronic kidney disease: Secondary | ICD-10-CM | POA: Diagnosis not present

## 2021-05-10 ENCOUNTER — Encounter: Payer: Self-pay | Admitting: Adult Health

## 2021-05-10 ENCOUNTER — Ambulatory Visit (INDEPENDENT_AMBULATORY_CARE_PROVIDER_SITE_OTHER): Payer: Medicare Other | Admitting: Adult Health

## 2021-05-10 VITALS — BP 124/62 | HR 82 | Ht 74.0 in | Wt 334.1 lb

## 2021-05-10 DIAGNOSIS — I6381 Other cerebral infarction due to occlusion or stenosis of small artery: Secondary | ICD-10-CM | POA: Diagnosis not present

## 2021-05-10 DIAGNOSIS — I6523 Occlusion and stenosis of bilateral carotid arteries: Secondary | ICD-10-CM

## 2021-05-10 NOTE — Progress Notes (Signed)
[facility] [address] [place]. [place] [address] [phone]       STROKE FOLLOW UP NOTE  [name] Date of Birth:  27-Nov-1945 Medical Record Number:  [id number]   Reason for Referral: stroke follow up    SUBJECTIVE:   CHIEF COMPLAINT:  Chief Complaint  Patient presents with   Cerebrovascular Accident    Rm 3 alone. Doing well. No concerns     HPI:   Brandon Sanchez is a 75 y.o. male with PMHx pertinent for A. fib on Eliquis, HTN, HLD, DM, bilateral carotid stenosis and right BG/CR stroke on 12/03/2019 without residual deficit.  Routinely followed for stroke follow-up.  Today, 05/10/2021, Brandon Sanchez returns for 71-month stroke follow-up.  Overall stable.  Denies new or reoccurring stroke/TIA symptoms.  Compliant on aspirin, Eliquis and atorvastatin without side effects.  Blood pressure today 124/62.  Recent A1c 8.0 (per pt report) down from 8.6 routinely followed by endocrinology.  Does report gradual improvement of blood sugars with making dietary changes.  No new neurological concerns at this time.    History provided for reference purposes only Update 10/25/2020 JM: Brandon Sanchez returns for 6-month stroke follow-up unaccompanied  He has been doing well since prior visit without new or recurrent stroke/TIA symptoms Compliant on aspirin, Eliquis and atorvastatin  -denies side effects Blood pressure today 122/74 - monitors at home and typically stable  Glucose levels stable per patient report - has f/u with endocrinology next month  Routinely followed by cardiology and PCP - OV notes personally reviewed Lipid panel 05/2020 LDL 81 A1c previously around "6 something" per patient in October showing improvement from prior result -unable to personally view via epic  No new concerns at this time  Update 04/27/2020 JM: Brandon Sanchez returns for stroke follow-up Doing well from a stroke standpoint without residual deficits and denies new or reoccurring  stroke/TIA symptoms Remains on aspirin 81 mg daily and Eliquis without bleeding or bruising for secondary stroke prevention and atrial fibrillation Remains on atorvastatin 80 mg daily without myalgias Blood pressure today 120/60 Glucose levels avg 130-140 per patient He is requesting clearance to pursue cataract surgery No further concerns at this time  Initial visit 01/25/2020 JM: Brandon Sanchez is being seen for hospital follow-up.  He is accompanied by his daughter via telephone during visit.  He was discharged from CIR to home with outpatient therapies on 12/17/2019.  He has recovered well from a stroke standpoint with complete resolution and return to baseline.  Discharged from outpatient PT on 01/18/2020 due to meeting all goals.  He has continued on aspirin 81 mg daily and Eliquis without bleeding or bruising.  Continues on atorvastatin 80 mg daily without myalgias.  Blood pressure today 109/66 -monitors at home routinely and currently has been ranging 110-120/70-80s.  He was restarted on Lasix due to lower extremity edema with blood pressure ranging 130-150 prior to restarting.  Glucose levels stable and continues to follow with endocrinology for monitoring and management.  Continues to follow closely with PCP, endocrinology and cardiology. He did undergo sleep study prior to his stroke on 11/24/2019 which was negative for sleep apnea.  No concerns at this time.  Stroke admission 12/03/2019 Brandon Sanchez is a 75 y.o. male with history of atrial fibrillation and is on Eliquis, asthma, Htn and DM  who presented on 12/03/2019 with acute onset of left hemiplegia, left facial droop and dysarthria.   Stroke work-up revealed right BG/CR small scattered infarcts,  likely due to right CCA mixed plaque although A. fib could be potential etiology but he is on Eliquis and stroke location not typical for A. fib related infarct.  CTA showed atherosclerotic changes in the distal common carotid arteries at the carotid  bifurcations bilaterally significant stenosis.  Continuation of Eliquis for atrial fibrillation with addition of aspirin 81 mg daily for secondary stroke prevention.  History of HTN with hypotension during admission with fluctuation of neuro symptoms and discontinue metoprolol and lowered furosemide dosage.  History of HLD on atorvastatin 40 mg daily with LDL 52 and increase atorvastatin dosage to 80 mg daily.  DM with A1c 7.6.  Other stroke risk factors include advanced age, prior EtOH use and obesity but no prior stroke history.  Residual deficits of mild left facial droop and left hemiparesis.  Evaluated by therapies who recommended discharge to CIR for functional decline.   Stroke: right BG/CR small scattered infarcts, likely due to right CCA mixed plaque. Although afib could be potential etiology, but he is on eliquis and stroke location not typical for afib related infarct.  CT Head - No acute intracranial abnormality.  CTA H&N - No emergent large vessel occlusion. Atherosclerotic changes in the distal common carotid arteries and at the carotid bifurcations bilaterally significant stenosis.  MRI head right BG/CR small infarcts MRA head - normal 2D Echo EF 55-60% [employer] Virus 2 - negative LDL - 52 HgbA1c - 7.6 VTE prophylaxis - Eliquis Eliquis (apixaban) daily prior to admission, now on aspirin 81 mg daily and Eliquis (apixaban) daily. Continue on discharge Patient counseled to be compliant with his antithrombotic medications Ongoing aggressive stroke risk factor management Therapy recommendations: CIR Disposition:  CIR     ROS:   14 system review of systems performed and negative with exception of bilateral knee pain and chronic neck pain  PMH:  Past Medical History:  Diagnosis Date   Allergic rhinitis, cause unspecified    Asthma    Cataract cortical, senile, bilateral    Macular degeneration of left eye    Other and unspecified hyperlipidemia    Type II or unspecified  type diabetes mellitus without mention of complication, not stated as uncontrolled    Unspecified essential hypertension     PSH:  Past Surgical History:  Procedure Laterality Date   ATRIAL FIBRILLATION ABLATION N/A 03/17/2020   Procedure: ATRIAL FIBRILLATION ABLATION;  Surgeon: Regan Lemming, MD;  Location: [facility] INVASIVE CV LAB;  Service: Cardiovascular;  Laterality: N/A;   CARDIOVERSION N/A 09/22/2019   Procedure: CARDIOVERSION;  Surgeon: [name], MD;  Location: [facility] ENDOSCOPY;  Service: Endoscopy;  Laterality: N/A;   CARDIOVERSION N/A 10/18/2019   Procedure: CARDIOVERSION;  Surgeon: Jake Bathe, MD;  Location: [facility] ENDOSCOPY;  Service: Cardiovascular;  Laterality: N/A;   CATARACT EXTRACTION, BILATERAL  08/2020   TEE WITHOUT CARDIOVERSION N/A 09/22/2019   Procedure: TRANSESOPHAGEAL ECHOCARDIOGRAM (TEE);  Surgeon: [name], MD;  Location[facility] ENDOSCOPY;  Service: Endoscopy;  Laterality: N/A;    Social History:  Social History   Socioeconomic History   Marital status: Divorced    Spouse name: Not on file   Number of children: Not on file   Years of education: Not on file   Highest education level: Professional school degree (e.g., MD, DDS, DVM, JD)  Occupational History   Occupation: Defense attorney  Tobacco Use   Smoking status: Never   Smokeless tobacco: Never  Substance and Sexual Activity   Alcohol use: Not Currently  Comment: Used to drink 2 L bottle of wine/day. Quit 09/18/19   Drug use: Never   Sexual activity: Not Currently  Other Topics Concern   Not on file  Social History Narrative   Not on file   Social Determinants of Health   Financial Resource Strain: Not on file  Food Insecurity: Not on file  Transportation Needs: Not on file  Physical Activity: Not on file  Stress: Not on file  Social Connections: Not on file  Intimate Partner Violence: Not on file    Family History:  Family History  Problem Relation Age of Onset    Colon cancer Father    Heart disease Paternal Grandfather     Medications:   Current Outpatient Medications on File Prior to Visit  Medication Sig Dispense Refill   albuterol (PROAIR HFA) 108 (90 BASE) MCG/ACT inhaler 2 puffs every 4 hours as needed only  if your can't catch your breath 1 Inhaler 1   aspirin EC 81 MG EC tablet Take 1 tablet (81 mg total) by mouth daily. 60 tablet 0   atorvastatin (LIPITOR) 80 MG tablet Take 1 tablet (80 mg total) by mouth every evening. 30 tablet 0   B-D ULTRAFINE III SHORT PEN 31G X 8 MM MISC SMARTSIG:Pre-Filled Pen Syringe SUB-Q 3 Times Daily     BREO ELLIPTA 200-25 MCG/INH AEPB Inhale 1 puff into the lungs daily as needed (shortness of breath).      ELIQUIS 5 MG TABS tablet TAKE 1 TABLET(5 MG) BY MOUTH TWICE DAILY 60 tablet 5   furosemide (LASIX) 40 MG tablet Take 1 tablet (40 mg total) by mouth daily. 90 tablet 3   Multiple Vitamin (MULTIVITAMIN WITH MINERALS) TABS tablet Take 1 tablet by mouth daily.     MYRBETRIQ 25 MG TB24 tablet Take 25 mg by mouth daily.     NOVOLOG MIX 70/30 FLEXPEN (70-30) 100 UNIT/ML FlexPen Inject 60 Units into the skin in the morning and at bedtime.     ONETOUCH VERIO test strip 1 each by Other route in the morning, at noon, and at bedtime.      polyethylene glycol (MIRALAX / GLYCOLAX) 17 g packet Take 17 g by mouth daily. 14 each 0   PREVIDENT 5000 BOOSTER PLUS 1.1 % PSTE Place 1 application onto teeth in the morning and at bedtime.      sertraline (ZOLOFT) 100 MG tablet Take 100 mg by mouth daily.     No current facility-administered medications on file prior to visit.    Allergies:  No Known Allergies    OBJECTIVE:  Physical Exam  Vitals:   05/10/21 0833  BP: 124/62  Pulse: 82  SpO2: 96%  Weight: (!) 334 lb 2 oz (151.6 kg)  Height: 6\' 2"  (1.88 m)    Body mass index is 42.9 kg/m. No results found.   General: well developed, well nourished, very pleasant elderly Caucasian male, seated, in no evident  distress Head: head normocephalic and atraumatic.   Neck: supple with no carotid or supraclavicular bruits Cardiovascular: regular rate and rhythm, no murmurs  Neurologic Exam Mental Status: Awake and fully alert. Fluent speech and language.  Oriented to place and time. Recent and remote memory intact. Attention span, concentration and fund of knowledge appropriate. Mood and affect appropriate.  Cranial Nerves: Pupils equal, briskly reactive to light. Extraocular movements full without nystagmus. Visual fields full to confrontation. Hearing intact. Facial sensation intact. Face, tongue, palate moves normally and symmetrically.  Motor: Normal bulk and  tone. Normal strength in all tested extremity muscles. Sensory.: intact to touch , pinprick , position and vibratory sensation.  Coordination: Rapid alternating movements normal in all extremities. Finger-to-nose and heel-to-shin performed accurately bilaterally. Gait and Station: Arises from chair without difficulty. Stance is normal. Gait demonstrates  mild gait impairment secondary to bilateral chronic knee pain but able to ambulate without assistive device Reflexes: 1+ and symmetric. Toes downgoing.       ASSESSMENT: Brandon Sanchez is a 75 y.o. year old male presented with acute onset of ophthalmoplegia, left facial droop and dysarthria on 12/03/2019 with stroke work-up revealing right BG/CR small scattered infarcts likely secondary to right CCA mixed plaque.  Noted that A. fib could be potential etiology but he is on Eliquis and stroke formation notable for A. fib related infarct.  Recommended adding aspirin in addition to Eliquis at discharge.  Vascular risk factors include PAF on Eliquis, b/l CCA stenosis, HTN, HLD and DM.  Evaluated for sleep apnea which was negative.     PLAN:  Right BG/CR stroke:  Recovered well without residual deficits Continue aspirin 81 mg daily and Eliquis (apixaban) daily  and atorvastatin 80 mg daily for  secondary stroke prevention.  Discussed secondary stroke prevention measures and importance of close PCP follow-up for aggressive stroke risk factor management Atrial fibrillation:  On Eliquis 5 mg twice daily with CHA2DS2-VASc score of at least 5 (age, HTN, stroke, DM)  S/p successful ablation 03/17/2020 Routinely monitored by cardiologist Dr. Elberta Fortis R carotid plaque: Obtain carotid ultrasound for surveillance monitoring (declined imaging at prior visits) HTN:  BP goal<130/90.  Stable today. Managed by cardiology HLD:  LDL goal<70.  LDL 59 on 12/26/2020 on atorvastatin 80 mg daily per PCP DMII:  A1c goal<7.  A1c 8.0 (per pt report, unable to view via epic) down from 8.6.  Gradually improving making dietary changes.  Managed by endocrinology    Overall stable from stroke standpoint routinely followed by PCP, cardiology and endocrinology for aggressive stroke risk factor management therefore recommend follow-up on an as-needed basis which patient agreed with   CC:  [facility] provider: Dr. Henrietta Hoover, Zollie Beckers, MD    I spent 31 minutes of face-to-face and non-face-to-face time with patient.  This included previsit chart review, lab review, study review, order entry, electronic health record documentation, patient education regarding prior stroke, secondary stroke prevention measures and importance of managing stroke risk factors, indication for carotid ultrasound surveillance monitoring and answered all other questions to patient satisfaction  Ihor Austin, [facility]  [facility] [address] [place], [place] [address]  Phone [phone] Fax [phone] Note: This document was prepared with digital dictation and possible smart phrase technology. Any transcriptional errors that result from this process are unintentional.

## 2021-05-10 NOTE — Progress Notes (Signed)
I agree with the above plan 

## 2021-05-10 NOTE — Patient Instructions (Signed)
Continue aspirin 81 mg daily and Eliquis (apixaban) daily  and atorvastatin for secondary stroke prevention  Continue to follow up with PCP regarding cholesterol, blood pressure and diabetes management  Maintain strict control of hypertension with blood pressure goal below 130/90, diabetes with hemoglobin A1c goal below 7% and cholesterol with LDL cholesterol (bad cholesterol) goal below 70 mg/dL.   You will be called to schedule a carotid ultrasound for surveillance monitoring of known carotid narrowing         Thank you for coming to see Brandon Sanchez at [facility]. I hope we have been able to provide you high quality care today.  You may receive a patient satisfaction survey over the next few weeks. We would appreciate your feedback and comments so that we may continue to improve ourselves and the health of our patients.

## 2021-05-14 ENCOUNTER — Telehealth: Payer: Self-pay | Admitting: Adult Health

## 2021-05-14 NOTE — Telephone Encounter (Signed)
Medicare/bcbs fed no auth require, sent Lupita Leash a message she will reach out to the patient to schedule.

## 2021-05-23 ENCOUNTER — Other Ambulatory Visit: Payer: Self-pay

## 2021-05-23 ENCOUNTER — Ambulatory Visit (HOSPITAL_COMMUNITY)
Admission: RE | Admit: 2021-05-23 | Discharge: 2021-05-23 | Disposition: A | Discharge status: Home / Self Care | Payer: Medicare Other | Source: Ambulatory Visit | Attending: Adult Health | Admitting: Adult Health

## 2021-05-23 ENCOUNTER — Telehealth: Payer: Self-pay

## 2021-05-23 DIAGNOSIS — I6523 Occlusion and stenosis of bilateral carotid arteries: Secondary | ICD-10-CM | POA: Diagnosis not present

## 2021-05-23 NOTE — Telephone Encounter (Signed)
Contacted pt regarding carotid ultrasound, no answer. VM full

## 2021-05-23 NOTE — Progress Notes (Signed)
Carotid artery duplex completed. Refer to "CV Proc" under chart review to view preliminary results.  05/23/2021 8:36 AM Eula Fried., MHA, RVT, RDCS, RDMS

## 2021-05-23 NOTE — Telephone Encounter (Signed)
-----   Message from Ihor Austin, NP sent at 05/23/2021 12:33 PM EDT ----- (Unable to send via MyChart) Please advise patient that recent carotid ultrasound showed minimal to mild narrowing in both carotid arteries and vertebral arteries adequate flow.  No concerning findings.

## 2021-05-23 NOTE — Telephone Encounter (Signed)
Contacted pt again, informed him that recent carotid ultrasound showed minimal to mild narrowing in both carotid arteries and vertebral arteries adequate flow.  No concerning findings per Shanda Bumps. Advised to call the office with questions, he understood and was grateful.

## 2021-06-20 DIAGNOSIS — N200 Calculus of kidney: Secondary | ICD-10-CM | POA: Diagnosis not present

## 2021-06-20 DIAGNOSIS — R3915 Urgency of urination: Secondary | ICD-10-CM | POA: Diagnosis not present

## 2021-06-20 DIAGNOSIS — Z125 Encounter for screening for malignant neoplasm of prostate: Secondary | ICD-10-CM | POA: Diagnosis not present

## 2021-06-25 DIAGNOSIS — E1129 Type 2 diabetes mellitus with other diabetic kidney complication: Secondary | ICD-10-CM | POA: Diagnosis not present

## 2021-06-25 DIAGNOSIS — Z125 Encounter for screening for malignant neoplasm of prostate: Secondary | ICD-10-CM | POA: Diagnosis not present

## 2021-06-25 DIAGNOSIS — I10 Essential (primary) hypertension: Secondary | ICD-10-CM | POA: Diagnosis not present

## 2021-06-25 DIAGNOSIS — E782 Mixed hyperlipidemia: Secondary | ICD-10-CM | POA: Diagnosis not present

## 2021-06-28 DIAGNOSIS — D6859 Other primary thrombophilia: Secondary | ICD-10-CM | POA: Diagnosis not present

## 2021-06-28 DIAGNOSIS — Z6841 Body Mass Index (BMI) 40.0 and over, adult: Secondary | ICD-10-CM | POA: Diagnosis not present

## 2021-06-28 DIAGNOSIS — Z8673 Personal history of transient ischemic attack (TIA), and cerebral infarction without residual deficits: Secondary | ICD-10-CM | POA: Diagnosis not present

## 2021-06-28 DIAGNOSIS — E039 Hypothyroidism, unspecified: Secondary | ICD-10-CM | POA: Diagnosis not present

## 2021-06-28 DIAGNOSIS — Z0000 Encounter for general adult medical examination without abnormal findings: Secondary | ICD-10-CM | POA: Diagnosis not present

## 2021-06-28 DIAGNOSIS — F331 Major depressive disorder, recurrent, moderate: Secondary | ICD-10-CM | POA: Diagnosis not present

## 2021-06-28 DIAGNOSIS — N529 Male erectile dysfunction, unspecified: Secondary | ICD-10-CM | POA: Diagnosis not present

## 2021-06-28 DIAGNOSIS — I480 Paroxysmal atrial fibrillation: Secondary | ICD-10-CM | POA: Diagnosis not present

## 2021-06-28 DIAGNOSIS — J309 Allergic rhinitis, unspecified: Secondary | ICD-10-CM | POA: Diagnosis not present

## 2021-06-28 DIAGNOSIS — I10 Essential (primary) hypertension: Secondary | ICD-10-CM | POA: Diagnosis not present

## 2021-06-28 DIAGNOSIS — E1129 Type 2 diabetes mellitus with other diabetic kidney complication: Secondary | ICD-10-CM | POA: Diagnosis not present

## 2021-06-28 DIAGNOSIS — Z1159 Encounter for screening for other viral diseases: Secondary | ICD-10-CM | POA: Diagnosis not present

## 2021-06-29 ENCOUNTER — Other Ambulatory Visit (HOSPITAL_COMMUNITY): Payer: Self-pay | Admitting: Cardiology

## 2021-06-29 NOTE — Telephone Encounter (Signed)
Rx(s) sent to pharmacy electronically.  

## 2021-07-11 ENCOUNTER — Encounter (HOSPITAL_COMMUNITY): Payer: Self-pay | Admitting: Emergency Medicine

## 2021-07-11 ENCOUNTER — Telehealth: Payer: Self-pay | Admitting: Internal Medicine

## 2021-07-11 ENCOUNTER — Emergency Department (HOSPITAL_COMMUNITY): Payer: Medicare Other

## 2021-07-11 ENCOUNTER — Emergency Department (HOSPITAL_COMMUNITY)
Admission: EM | Admit: 2021-07-11 | Discharge: 2021-07-11 | Disposition: A | Discharge status: Home / Self Care | Payer: Medicare Other | Attending: Emergency Medicine | Admitting: Emergency Medicine

## 2021-07-11 ENCOUNTER — Other Ambulatory Visit: Payer: Self-pay

## 2021-07-11 DIAGNOSIS — R1111 Vomiting without nausea: Secondary | ICD-10-CM | POA: Diagnosis not present

## 2021-07-11 DIAGNOSIS — Z7901 Long term (current) use of anticoagulants: Secondary | ICD-10-CM | POA: Diagnosis not present

## 2021-07-11 DIAGNOSIS — E119 Type 2 diabetes mellitus without complications: Secondary | ICD-10-CM | POA: Diagnosis not present

## 2021-07-11 DIAGNOSIS — R079 Chest pain, unspecified: Secondary | ICD-10-CM | POA: Diagnosis not present

## 2021-07-11 DIAGNOSIS — I10 Essential (primary) hypertension: Secondary | ICD-10-CM | POA: Insufficient documentation

## 2021-07-11 DIAGNOSIS — Z794 Long term (current) use of insulin: Secondary | ICD-10-CM | POA: Diagnosis not present

## 2021-07-11 DIAGNOSIS — R1310 Dysphagia, unspecified: Secondary | ICD-10-CM | POA: Diagnosis not present

## 2021-07-11 DIAGNOSIS — I480 Paroxysmal atrial fibrillation: Secondary | ICD-10-CM | POA: Diagnosis not present

## 2021-07-11 DIAGNOSIS — I491 Atrial premature depolarization: Secondary | ICD-10-CM | POA: Diagnosis not present

## 2021-07-11 DIAGNOSIS — R1319 Other dysphagia: Secondary | ICD-10-CM

## 2021-07-11 DIAGNOSIS — Z79899 Other long term (current) drug therapy: Secondary | ICD-10-CM | POA: Insufficient documentation

## 2021-07-11 DIAGNOSIS — Z7982 Long term (current) use of aspirin: Secondary | ICD-10-CM | POA: Insufficient documentation

## 2021-07-11 DIAGNOSIS — J4540 Moderate persistent asthma, uncomplicated: Secondary | ICD-10-CM | POA: Diagnosis not present

## 2021-07-11 LAB — BASIC METABOLIC PANEL
Anion gap: 8 (ref 5–15)
BUN: 22 mg/dL (ref 8–23)
CO2: 25 mmol/L (ref 22–32)
Calcium: 9.1 mg/dL (ref 8.9–10.3)
Chloride: 106 mmol/L (ref 98–111)
Creatinine, Ser: 1.34 mg/dL — ABNORMAL HIGH (ref 0.61–1.24)
GFR, Estimated: 56 mL/min — ABNORMAL LOW (ref >60)
Glucose, Bld: 145 mg/dL — ABNORMAL HIGH (ref 70–99)
Potassium: 4.4 mmol/L (ref 3.5–5.1)
Sodium: 139 mmol/L (ref 135–145)

## 2021-07-11 LAB — CBC WITH DIFFERENTIAL/PLATELET
Abs Immature Granulocytes: 0.08 10*3/uL — ABNORMAL HIGH (ref 0.00–0.07)
Basophils Absolute: 0.1 10*3/uL (ref 0.0–0.1)
Basophils Relative: 0 %
Eosinophils Absolute: 0.4 10*3/uL (ref 0.0–0.5)
Eosinophils Relative: 3 %
HCT: 40.1 % (ref 39.0–52.0)
Hemoglobin: 12.9 g/dL — ABNORMAL LOW (ref 13.0–17.0)
Immature Granulocytes: 1 %
Lymphocytes Relative: 28 %
Lymphs Abs: 4.1 10*3/uL — ABNORMAL HIGH (ref 0.7–4.0)
MCH: 30.1 pg (ref 26.0–34.0)
MCHC: 32.2 g/dL (ref 30.0–36.0)
MCV: 93.5 fL (ref 80.0–100.0)
Monocytes Absolute: 0.8 10*3/uL (ref 0.1–1.0)
Monocytes Relative: 6 %
Neutro Abs: 9 10*3/uL — ABNORMAL HIGH (ref 1.7–7.7)
Neutrophils Relative %: 62 %
Platelets: 236 10*3/uL (ref 150–400)
RBC: 4.29 MIL/uL (ref 4.22–5.81)
RDW: 13.7 % (ref 11.5–15.5)
WBC: 14.4 10*3/uL — ABNORMAL HIGH (ref 4.0–10.5)
nRBC: 0 % (ref 0.0–0.2)

## 2021-07-11 LAB — TROPONIN I (HIGH SENSITIVITY): Troponin I (High Sensitivity): 10 ng/L (ref <18)

## 2021-07-11 NOTE — Discharge Instructions (Addendum)
Take your omeprazole once daily for the next two weeks.  Call to follow up with your Gastroenterologist.

## 2021-07-11 NOTE — ED Provider Notes (Signed)
[facility] EMERGENCY DEPARTMENT Provider Note   CSN: [id number] Arrival date & time: 07/11/21  6295     History Chief Complaint  Patient presents with   Dysphagia    Brandon Sanchez is a 75 y.o. male.  The history is provided by the patient.  Brandon Sanchez is a 75 y.o. male who presents to the Emergency Department complaining of difficulty swallowing.  He presents to the ED by EMS for evaluation of difficulty swallowing that occurred around 8pm when he sat down to eat his dinner (scrambled eggs).  He only swallowed one bite when it felt like it wasn't going down all the way.  Since that time he has been unable to swallow anything, including saliva.  He did have some intermittent chest discomfort.  No significant sob.  He has a hx/o DM, CVA, afib on eliquis.  He is concerned for hypoglycemia due to being unable to swallow and taking his evening insulin.  He has experienced similar episodes in the past but they normally resolve within in hour.  He has a gastroenterologist but has not had upper endoscopy.      Past Medical History:  Diagnosis Date   Allergic rhinitis, cause unspecified    Asthma    Cataract cortical, senile, bilateral    Macular degeneration of left eye    Other and unspecified hyperlipidemia    Type II or unspecified type diabetes mellitus without mention of complication, not stated as uncontrolled    Unspecified essential hypertension     Patient Active Problem List   Diagnosis Date Noted   Persistent atrial fibrillation (HCC) 04/14/2020   History of CVA (cerebrovascular accident) 12/27/2019   Bilateral leg edema 12/27/2019   Anemia 12/19/2019   Basal ganglia stroke (HCC) 12/10/2019   Left-sided weakness    Slow transit constipation    Pain in toes of both feet    Cerebral embolism with cerebral infarction 12/04/2019   CVA (cerebral vascular accident) (HCC) 12/04/2019   Acute left-sided weakness 12/03/2019   Mixed diabetic  hyperlipidemia associated with type 2 diabetes mellitus (HCC) 12/03/2019   Paroxysmal atrial fibrillation (HCC)    Secondary hypercoagulable state (HCC) 09/28/2019   Atrial fibrillation with rapid ventricular response (HCC) 09/20/2019   CAP (community acquired pneumonia) 09/20/2019   Obesity 05/12/2015   Uncontrolled type 2 diabetes mellitus with hypoglycemia without coma, with long-term current use of insulin (HCC) 02/04/2008   HLD (hyperlipidemia) 02/04/2008   Essential hypertension 02/04/2008   ALLERGIC RHINITIS 02/04/2008   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 02/04/2008    Past Surgical History:  Procedure Laterality Date   ATRIAL FIBRILLATION ABLATION N/A 03/17/2020   Procedure: ATRIAL FIBRILLATION ABLATION;  Surgeon: Regan Lemming, MD;  Location: [facility] INVASIVE CV LAB;  Service: Cardiovascular;  Laterality: N/A;   CARDIOVERSION N/A 09/22/2019   Procedure: CARDIOVERSION;  Surgeon: [name], MD;  Location: [facility] ENDOSCOPY;  Service: Endoscopy;  Laterality: N/A;   CARDIOVERSION N/A 10/18/2019   Procedure: CARDIOVERSION;  Surgeon: Jake Bathe, MD;  Location: [facility] ENDOSCOPY;  Service: Cardiovascular;  Laterality: N/A;   CATARACT EXTRACTION, BILATERAL  08/2020   TEE WITHOUT CARDIOVERSION N/A 09/22/2019   Procedure: TRANSESOPHAGEAL ECHOCARDIOGRAM (TEE);  Surgeon: [name], MD;  Location: [facility] ENDOSCOPY;  Service: Endoscopy;  Laterality: N/A;       Family History  Problem Relation Age of Onset   Colon cancer Father    Heart disease Paternal Grandfather     Social History  Tobacco Use   Smoking status: Never   Smokeless tobacco: Never  Substance Use Topics   Alcohol use: Not Currently    Comment: Used to drink 2 L bottle of wine/day. Quit 09/18/19   Drug use: Never    Home Medications Prior to Admission medications   Medication Sig Start Date End Date Taking? Authorizing Provider  albuterol (PROAIR HFA) 108 (90 BASE) MCG/ACT  inhaler 2 puffs every 4 hours as needed only  if your can't catch your breath 05/12/15   Tanda Rockers, MD  aspirin EC 81 MG EC tablet Take 1 tablet (81 mg total) by mouth daily. 12/11/19   Lavina Hamman, MD  atorvastatin (LIPITOR) 80 MG tablet Take 1 tablet (80 mg total) by mouth every evening. 12/17/19   Love, Ivan Anchors, PA-C  B-D ULTRAFINE III SHORT PEN 31G X 8 MM MISC SMARTSIG:Pre-Filled Pen Syringe SUB-Q 3 Times Daily 04/05/20   [provider]  BREO ELLIPTA 200-25 MCG/INH AEPB Inhale 1 puff into the lungs daily as needed (shortness of breath).  08/24/19   [provider]  ELIQUIS 5 MG TABS tablet TAKE 1 TABLET(5 MG) BY MOUTH TWICE DAILY 06/29/21   Buford Dresser, MD  furosemide (LASIX) 40 MG tablet Take 1 tablet (40 mg total) by mouth daily. 02/09/20   Buford Dresser, MD  Multiple Vitamin (MULTIVITAMIN WITH MINERALS) TABS tablet Take 1 tablet by mouth daily.    [provider]  MYRBETRIQ 25 MG TB24 tablet Take 25 mg by mouth daily. 01/06/20   [provider]  NOVOLOG MIX 70/30 FLEXPEN (70-30) 100 UNIT/ML FlexPen Inject 60 Units into the skin in the morning and at bedtime. 05/23/20   [provider]  [facility] VERIO test strip 1 each by Other route in the morning, at noon, and at bedtime.  07/26/19   [provider]  polyethylene glycol (MIRALAX / GLYCOLAX) 17 g packet Take 17 g by mouth daily. 12/11/19   Lavina Hamman, MD  PREVIDENT 5000 BOOSTER PLUS 1.1 % PSTE Place 1 application onto teeth in the morning and at bedtime.  04/13/19   [provider]  sertraline (ZOLOFT) 100 MG tablet Take 100 mg by mouth daily.    [provider]    Allergies    Patient has no known allergies.  Review of Systems   Review of Systems  All other systems reviewed and are negative.  Physical Exam Updated Vital Signs BP 125/88   Pulse 89   Temp 98.8 F (37.1 C) (Oral)   Resp 17   SpO2 96%   Physical Exam Vitals and  nursing note reviewed.  Constitutional:      Appearance: He is well-developed.  HENT:     Head: Normocephalic and atraumatic.  Cardiovascular:     Rate and Rhythm: Normal rate and regular rhythm.     Heart sounds: Murmur heard.  Pulmonary:     Effort: Pulmonary effort is normal. No respiratory distress.     Breath sounds: Normal breath sounds.  Abdominal:     Palpations: Abdomen is soft.     Tenderness: There is no abdominal tenderness. There is no guarding or rebound.  Musculoskeletal:        General: No tenderness.     Comments: Trace edema to BLE  Skin:    General: Skin is warm and dry.  Neurological:     Mental Status: He is alert and oriented to person, place, and time.  Psychiatric:  Behavior: Behavior normal.    ED Results / Procedures / Treatments   Labs (all labs ordered are listed, but only abnormal results are displayed) Labs Reviewed  BASIC METABOLIC PANEL - Abnormal; Notable for the following components:      Result Value   Glucose, Bld 145 (*)    Creatinine, Ser 1.34 (*)    GFR, Estimated 56 (*)    All other components within normal limits  CBC WITH DIFFERENTIAL/PLATELET - Abnormal; Notable for the following components:   WBC 14.4 (*)    Hemoglobin 12.9 (*)    Neutro Abs 9.0 (*)    Lymphs Abs 4.1 (*)    Abs Immature Granulocytes 0.08 (*)    All other components within normal limits  TROPONIN I (HIGH SENSITIVITY)    EKG EKG Interpretation  Date/Time:  Wednesday July 11 2021 03:03:10 EST Ventricular Rate:  97 PR Interval:  156 QRS Duration: 114 QT Interval:  366 QTC Calculation: 465 R Axis:   -65 Text Interpretation: Sinus rhythm Left anterior fascicular block Abnormal R-wave progression, early transition Consider anterior infarct Confirmed by Quintella Reichert [phone]) on 07/11/2021 4:00:13 AM  Radiology DG Chest 2 View  Result Date: 07/11/2021 CLINICAL DATA:  Chest pain. EXAM: CHEST - 2 VIEW COMPARISON:  12/04/2019. FINDINGS: The heart  size and mediastinal contours are within normal limits. There is mild atherosclerotic calcification of the aorta. Both lungs are clear. No acute osseous abnormality. IMPRESSION: No active cardiopulmonary disease. Electronically Signed   By: Brett Fairy M.D.   On: 07/11/2021 03:44    Procedures Procedures   Medications Ordered in ED Medications - No data to display  ED Course  I have reviewed the triage vital signs and the nursing notes.  Pertinent labs & imaging results that were available during my care of the patient were reviewed by me and considered in my medical decision making (see chart for details).    MDM Rules/Calculators/A&P                          patient with history of diabetes, atrial fibrillation on anticoagulation here for evaluation of inability to swallow after eating eggs this evening. He did have some associated chest discomfort. He was initially spitting out all of his secretions. Upon arrival to the emergency department he was able to swallow water without difficulty and feels like what ever blockage has since passed. Labs with stable leukocytosis, renal insufficiency. Current picture is not consistent with perforation, Boerhaave esophagus, esophageal food impaction, ACS. Discussed with patient recommendation to start taking a PPI and follow-up with his gastroenterologist. Discussed return precautions if he has recurrent symptoms.  Final Clinical Impression(s) / ED Diagnoses Final diagnoses:  Esophageal dysphagia    Rx / DC Orders ED Discharge Orders     None        Quintella Reichert, MD 07/11/21 0510

## 2021-07-11 NOTE — ED Triage Notes (Signed)
PT BIB [facility] for difficulty swallowing.  Pt has hx of slow esophagus.  Pt noticed around 2000 that he wasn't able to swallow his saliva. He had managed to get one bit of scrambled eggs down shortly before this.  Pt is a type 2 diabetic and took insulin around 1945.  Pt also has hx of afib with an ablation about a year ago.  Also hx of stroke a little over a year ago.   EMS VS 170/90, 95% RA, 100 HR, CBG: 128. PT was sinus arrhythmia for EMS.

## 2021-07-11 NOTE — Telephone Encounter (Signed)
Patient called stating that he felt like he has had something stuck in his throat since 2000 on 11/22 with associated chest pain that has progressively worsened since then. He denies any other associated symptoms. He thought that maybe it was a symptom of AF and so called for recommendations. Given his chest pain and concern for possible food impaction, he was instructed to  present to the ED. He decided to call EMS for assistance.

## 2021-07-19 DIAGNOSIS — E1129 Type 2 diabetes mellitus with other diabetic kidney complication: Secondary | ICD-10-CM | POA: Diagnosis not present

## 2021-10-23 ENCOUNTER — Other Ambulatory Visit: Payer: Self-pay

## 2021-10-23 ENCOUNTER — Ambulatory Visit (INDEPENDENT_AMBULATORY_CARE_PROVIDER_SITE_OTHER): Payer: Medicare Other | Admitting: Cardiology

## 2021-10-23 VITALS — BP 114/60 | HR 81 | Ht 75.0 in | Wt 323.4 lb

## 2021-10-23 DIAGNOSIS — E669 Obesity, unspecified: Secondary | ICD-10-CM

## 2021-10-23 DIAGNOSIS — Z8673 Personal history of transient ischemic attack (TIA), and cerebral infarction without residual deficits: Secondary | ICD-10-CM

## 2021-10-23 DIAGNOSIS — R0602 Shortness of breath: Secondary | ICD-10-CM | POA: Diagnosis not present

## 2021-10-23 DIAGNOSIS — E1169 Type 2 diabetes mellitus with other specified complication: Secondary | ICD-10-CM | POA: Diagnosis not present

## 2021-10-23 DIAGNOSIS — I480 Paroxysmal atrial fibrillation: Secondary | ICD-10-CM

## 2021-10-23 DIAGNOSIS — I350 Nonrheumatic aortic (valve) stenosis: Secondary | ICD-10-CM

## 2021-10-23 DIAGNOSIS — D6869 Other thrombophilia: Secondary | ICD-10-CM

## 2021-10-23 DIAGNOSIS — R011 Cardiac murmur, unspecified: Secondary | ICD-10-CM

## 2021-10-23 NOTE — Progress Notes (Signed)
Cardiology Office Note:    Date:  10/25/2021   ID:  Brandon Sanchez, DOB [birth year], MRN [id number]  PCP:  Deland Pretty, MD  Cardiologist:  Buford Dresser, MD  EP: Dr. Curt Bears  Referring MD: Deland Pretty, MD   CC: follow up  History of Present Illness:    Brandon Sanchez is a 76 y.o. male with a hx of asthma, type II diabetes, hypertension, hyperlipidemia, atrial fibrillation who is seen for follow up.   Cardiac history: admitted 09/19/19 with atrial fibrillation with RVR. Has known history of type II diabetes, hypertension, and hyperlipidemia. He was also treated for community acquired pneumonia during that admission. He was started on anticoagulation, and rate control was attempted. Course was complicated by hypotension. He underwent TEE-CV with initial return to sinus rhythm. However after discharge, he returned to afib with RVR.   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 Hospital notes reviewed. His presenting symptoms were acute left sided weakness and facial droop. He was outside the tPA window. He was continued on apixaban and atorvastatin (increased from 40 mg to 80 mg), with aspirin added. He completed an additional week of inpatient rehab prior to returning home.  S/p afib ablation by Dr. Curt Bears on 03/17/20.   Today: He is doing well. However, he reports chronic shortness of breath. Walking from the parking lot to the clinic caused him to become winded. He endorses seeing a pulmonologist in the past and undergoing a pulmonary function test in 2013. He was prescribed albuterol and, later, was given Breo Ellipta. He uses the [employer] frequently but not daily.   He initially saw the pulmonologist because he woke up in the middle of night not being able to breath. He has not had recent episodes of orthopnea. However, 3 to 4 years ago, he lived in [place] for about 1.5 years. During this time, he used the recumbent bike at  the [facility] daily and felt winded for the entire work-out. He noticed his shortness of breath has worsened since high school when he was able to play several sports without issues.   He has difficulty exercising regularly because of his shortness of breath and bilateral knee pain. He endorses a history of 3 knee injuries, torn ligaments in his R ankle, and dislocated shoulder. He reports his diet is healthy but was avoiding green, leafy vegetable because he was told he could not do so while on a blood thinner. He continues to work on weight loss and is open to trying a GLP-1 agonist.  Occasionally, his bilateral LE will swell. He was taking 3 furosemide daily in the past and was taken off when he was hospitalized. He is doing well on furosemide once a day.   He reports undergoing an ablation and 2 cardioversion procedures. He has not had recent Afib episodes. He checks his heart rate 3 to 4 times daily.   He saw neurosurgery several months ago. He was told his blood flow in his neck was normal.   Denies chest pain. No PND, orthopnea or unexpected weight gain. No syncope or palpitations.  Past Medical History:  Diagnosis Date   Allergic rhinitis, cause unspecified    Asthma    Cataract cortical, senile, bilateral    Macular degeneration of left eye    Other and unspecified hyperlipidemia    Type II or unspecified type diabetes mellitus without mention of complication, not stated as uncontrolled    Unspecified essential hypertension  Past Surgical History:  Procedure Laterality Date   ATRIAL FIBRILLATION ABLATION N/A 03/17/2020   Procedure: ATRIAL FIBRILLATION ABLATION;  Surgeon: Constance Haw, MD;  Location: [place] [facility];  Service: Cardiovascular;  Laterality: N/A;   CARDIOVERSION N/A 09/22/2019   Procedure: CARDIOVERSION;  Surgeon: Donato Heinz, MD;  Location: [facility] ENDOSCOPY;  Service: Endoscopy;  Laterality: N/A;   CARDIOVERSION N/A 10/18/2019   Procedure:  CARDIOVERSION;  Surgeon: Jerline Pain, MD;  Location: [facility] ENDOSCOPY;  Service: Cardiovascular;  Laterality: N/A;   CATARACT EXTRACTION, BILATERAL  08/2020   TEE WITHOUT CARDIOVERSION N/A 09/22/2019   Procedure: TRANSESOPHAGEAL ECHOCARDIOGRAM (TEE);  Surgeon: Donato Heinz, MD;  Location: [facility] ENDOSCOPY;  Service: Endoscopy;  Laterality: N/A;    Current Medications: Current Outpatient Medications on File Prior to Visit  Medication Sig   albuterol (PROAIR HFA) 108 (90 BASE) MCG/ACT inhaler 2 puffs every 4 hours as needed only  if your can't catch your breath   aspirin EC 81 MG EC tablet Take 1 tablet (81 mg total) by mouth daily.   atorvastatin (LIPITOR) 80 MG tablet Take 1 tablet (80 mg total) by mouth every evening.   B-D ULTRAFINE III SHORT PEN 31G X 8 MM MISC SMARTSIG:Pre-Filled Pen Syringe SUB-Q 3 Times Daily   BREO ELLIPTA 200-25 MCG/INH AEPB Inhale 1 puff into the lungs daily as needed (shortness of breath).    Cholecalciferol (VITAMIN D3) 50 MCG (2000 UT) TABS Take 50 mcg by mouth daily.   ELIQUIS 5 MG TABS tablet TAKE 1 TABLET(5 MG) BY MOUTH TWICE DAILY   furosemide (LASIX) 40 MG tablet Take 1 tablet (40 mg total) by mouth daily.   Multiple Vitamin (MULTIVITAMIN WITH MINERALS) TABS tablet Take 1 tablet by mouth daily.   MYRBETRIQ 25 MG TB24 tablet Take 25 mg by mouth daily.   NOVOLOG MIX 70/30 FLEXPEN (70-30) 100 UNIT/ML FlexPen Inject 60 Units into the skin in the morning and at bedtime.   ONETOUCH VERIO test strip 1 each by Other route in the morning, at noon, and at bedtime.    polyethylene glycol (MIRALAX / GLYCOLAX) 17 g packet Take 17 g by mouth daily.   PREVIDENT 5000 BOOSTER PLUS 1.1 % PSTE Place 1 application onto teeth in the morning and at bedtime.    sertraline (ZOLOFT) 100 MG tablet Take 100 mg by mouth daily.   No current facility-administered medications on file prior to visit.     Allergies:   Patient has no known allergies.   Social History   Tobacco Use    Smoking status: Never   Smokeless tobacco: Never  Substance Use Topics   Alcohol use: Not Currently    Comment: Used to drink 2 L bottle of wine/day. Quit 09/18/19   Drug use: Never    Family History: family history includes Colon cancer in his father; Heart disease in his paternal grandfather.  ROS:   Please see the history of present illness.   (+) Shortness of breath (+) LE edema Additional pertinent ROS negative except as noted in HPI   EKGs/Labs/Other Studies Reviewed:    The following studies were reviewed today: Carotid US 05/23/21 Right Carotid: Velocities in the right ICA are consistent with a 1-39%  stenosis.   Left Carotid: Velocities in the left ICA are consistent with a 1-39%  stenosis.   Vertebrals:  Bilateral vertebral arteries demonstrate antegrade flow.  Subclavians: Normal flow hemodynamics were seen in bilateral subclavian  arteries.  Ablation 03/17/20 CONCLUSIONS: 1. Atrial fibrillation upon presentation.   2. Successful electrical isolation and anatomical encircling of all four pulmonary veins with radiofrequency current.  A WACA approach was used 3. Additional left atrial ablation was performed with a left atrial roofline 4. Atrial fibrillation successfully cardioverted to sinus rhythm. 5. No early apparent complications.  CT Cardiac Morph 03/10/20 1. The left atrial appendage has chicken wing morphology with 1 major lobe, ostial size 27 x 17 mm and length 39 mm. There is no thrombus in the left atrial appendage.   2. The esophagus runs to the left from the left atrial midline and is in the proximity to the LLPV.   3. The esophagus runs in the left atrial midline and is not in the proximity to any of the pulmonary veins.   4. Calcium score is 1394 that corresponds with 86 percentile for age/sex.  Echo 12/04/19  1. Left ventricular ejection fraction, by estimation, is 55 to 60%. The  left ventricle has normal function. The left  ventricle has no regional  wall motion abnormalities. The left ventricular internal cavity size was  mildly dilated. There is mild  concentric left ventricular hypertrophy. Left ventricular diastolic  parameters are consistent with Grade III diastolic dysfunction  (restrictive). Elevated left ventricular end-diastolic pressure.   2. Right ventricular systolic function is normal. The right ventricular  size is mildly enlarged.   3. Left atrial size was mildly dilated.   4. Right atrial size was mild to moderately dilated.   5. The mitral valve is grossly normal. Trivial mitral valve  regurgitation. No evidence of mitral stenosis.   6. The aortic valve is tricuspid. Aortic valve regurgitation is mild to  moderate. Moderate aortic valve stenosis.   7. The inferior vena cava is dilated in size with >50% respiratory  variability, suggesting right atrial pressure of 8 mmHg.   Echo 09/20/19  1. Left ventricular ejection fraction, by visual estimation, is 50 to  55%. The left ventricle has normal function. There is mildly increased  left ventricular hypertrophy.   2. Definity contrast agent was given IV to delineate the left ventricular  endocardial borders.   3. Left ventricular diastolic function could not be evaluated.   4. Mildly dilated left ventricular internal cavity size.   5. The left ventricle has no regional wall motion abnormalities.   6. Global right ventricle has normal systolic function.The right  ventricular size is mildly enlarged.   7. Left atrial size was mildly dilated.   8. Right atrial size was mildly dilated.   9. Mild mitral annular calcification.  10. Trivial mitral valve regurgitation. No evidence of mitral stenosis.  11. The tricuspid valve is normal in structure. Tricuspid valve  regurgitation is trivial.  12. The aortic valve was not well visualized. Aortic valve regurgitation  is not visualized. Mild to moderate aortic valve stenosis.  13. The pulmonic valve  was not well visualized. Pulmonic valve  regurgitation is not visualized.  14. The inferior vena cava is dilated in size with <50% respiratory  variability, suggesting right atrial pressure of 15 mmHg.  15. Technically difficult; definity used; low normal LV systolic function;  mild LVH; mild LVE; calcified aortic valve with mild to moderate AS (mean  gradient 19 mmHg); mild LAE/RAE/RVE.   EKG:  EKG personally reviewed today 10/23/21: not ordered 06/20/20: NSR at 81 bpm, LAFB, IVCD  Recent Labs: 12/13/2020: ALT 16 07/11/2021: BUN 22; Creatinine, Ser 1.34; Hemoglobin 12.9; Platelets 236; Potassium  4.4; Sodium 139  Recent Lipid Panel    Component Value Date/Time   CHOL 96 12/04/2019 0503   TRIG 59 12/04/2019 0503   HDL 32 (L) 12/04/2019 0503   CHOLHDL 3.0 12/04/2019 0503   VLDL 12 12/04/2019 0503   LDLCALC 52 12/04/2019 0503    Physical Exam:    VS:  BP 114/60 (BP Location: Right Arm, Patient Position: Sitting)    Pulse 81    Ht 6\' 3"  (1.905 m)    Wt (!) 323 lb 6.4 oz (146.7 kg)    SpO2 93%    BMI 40.42 kg/m     Wt Readings from Last 3 Encounters:  10/23/21 (!) 323 lb 6.4 oz (146.7 kg)  05/10/21 (!) 334 lb 2 oz (151.6 kg)  04/26/21 (!) 331 lb 3.2 oz (150.2 kg)    GEN: Well nourished, well developed in no acute distress HEENT: Normal, moist mucous membranes NECK: No JVD CARDIAC: regular rhythm, normal S1 and S2, no rubs or gallops. 2 separate systolic murmurs, both 2/6. Aortic stenosis murmur RUSB, MR murmur LSB--different qualities VASCULAR: Radial and DP pulses 2+ bilaterally. No carotid bruits RESPIRATORY:  Clear to auscultation without rales, wheezing or rhonchi  ABDOMEN: Soft, non-tender, non-distended MUSCULOSKELETAL:  Ambulates independently SKIN: Warm and dry, no edema NEUROLOGIC:  Alert and oriented x 3. No focal neuro deficits noted. PSYCHIATRIC:  Normal affect   ASSESSMENT:    1. Shortness of breath   2. Aortic valve stenosis, etiology of cardiac valve disease  unspecified   3. Paroxysmal atrial fibrillation (HCC)   4. History of CVA (cerebrovascular accident)   5. Secondary hypercoagulable state ([place])   6. Murmur, cardiac   7. Diabetes mellitus type 2 in obese [facility])     PLAN:    Shortness of breath: improves with inhaler, not only exertional. He will start taking his Breo routinely and see if this helps. If not, he will contact Dr. Shelia Media to determine next steps, if repeat PFTs are needed. Appears euvolemic today.  Paroxysmal atrial fibrillation with RVR:  -In NSR today. -on apixaban for anticoagulation -s/p ablation by Dr. Curt Bears.  -instructed on red flag warning signs that need immediate medical attention  History of CVA: -on aspirin, apixaban, and atorvastatin  Type II diabetes: -with history of CVA, as well as BMI >40, would recommend GLP1RA. We discussed this. Defer to his endocrinologist Dr. Chalmers Cater.  Murmurs: -able to determine 2 different murmurs based on quality. Still 2/6. Harsher AS murmur on right, blowing MR murmur on left sternal border. 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 Does have moderate AS. Monitor periodically, due for echo in 1 year.  Possible OSA: sleep study didn't meet criteria for CPAP. Recommended to avoid laying supine.  CV risk counseling and prevention: -recommend heart healthy/Mediterranean diet, with whole grains, fruits, vegetable, fish, lean meats, nuts, and olive oil. Limit salt. -recommend moderate walking, 3-5 times/week for 30-50 minutes each session. Aim for at least 150 minutes.week. Goal should be pace of 3 miles/hours, or walking 1.5 miles in 30 minutes -recommend avoidance of tobacco products. Avoid excess alcohol.  Plan for follow up: 1 year after echo  Buford Dresser, MD, PhD, [facility]   Medication Adjustments/Labs and Tests Ordered: Current medicines are reviewed at length with the patient today.  Concerns regarding medicines  are outlined above.  Orders Placed This Encounter  Procedures   ECHOCARDIOGRAM COMPLETE   No orders of  the defined types were placed in this encounter.   Patient Instructions  Medication Instructions:  Your Physician recommend you continue on your current medication as directed.    *If you need a refill on your cardiac medications before your next appointment, please call your pharmacy*   Lab Work: None ordered today   Testing/Procedures: Your physician has requested that you have an echocardiogram in 1 year. Echocardiography is a painless test that uses sound waves to create images of your heart. It provides your doctor with information about the size and shape of your heart and how well your hearts chambers and valves are working. This procedure takes approximately one hour. There are no restrictions for this procedure. [place], you and your health needs are our priority.  As part of our continuing mission to provide you with exceptional heart care, we have created designated Provider Care Teams.  These Care Teams include your primary Cardiologist (physician) and Advanced Practice Providers (APPs -  Physician Assistants and Nurse Practitioners) who all work together to provide you with the care you need, when you need it.  We recommend signing up for the patient portal called "MyChart".  Sign up information is provided on this After Visit Summary.  MyChart is used to connect with patients for Virtual Visits (Telemedicine).  Patients are able to view lab/test results, encounter notes, upcoming appointments, etc.  Non-urgent messages can be sent to your provider as well.   To learn more about what you can do with MyChart, go to [email or web address].    Your next appointment:   1 year(s)  The format for your next appointment:   In Person  Provider:   Buford Dresser, MD       Wilhemina Bonito as a  scribe for Buford Dresser, MD.,have documented all relevant documentation on the behalf of Buford Dresser, MD,as directed by  Buford Dresser, MD while in the presence of Buford Dresser, MD.  I, Buford Dresser, MD, have reviewed all documentation for this visit. The documentation on 10/25/21 for the exam, diagnosis, procedures, and orders are all accurate and complete.   Signed, Buford Dresser, MD PhD 10/25/2021   [place]

## 2021-10-23 NOTE — Patient Instructions (Signed)
Medication Instructions:  ?Your Physician recommend you continue on your current medication as directed.   ? ?*If you need a refill on your cardiac medications before your next appointment, please call your pharmacy* ? ? ?Lab Work: ?None ordered today ? ? ?Testing/Procedures: ?Your physician has requested that you have an echocardiogram in 1 year. Echocardiography is a painless test that uses sound waves to create images of your heart. It provides your doctor with information about the size and shape of your heart and how well your heart?s chambers and valves are working. This procedure takes approximately one hour. There are no restrictions for this procedure. ?[address] ? ? ? ?Follow-Up: ?At [facility], you and your health needs are our priority.  As part of our continuing mission to provide you with exceptional heart care, we have created designated Provider Care Teams.  These Care Teams include your primary Cardiologist (physician) and Advanced Practice Providers (APPs -  Physician Assistants and Nurse Practitioners) who all work together to provide you with the care you need, when you need it. ? ?We recommend signing up for the patient portal called "MyChart".  Sign up information is provided on this After Visit Summary.  MyChart is used to connect with patients for Virtual Visits (Telemedicine).  Patients are able to view lab/test results, encounter notes, upcoming appointments, etc.  Non-urgent messages can be sent to your provider as well.   ?To learn more about what you can do with MyChart, go to [email or web address].   ? ?Your next appointment:   ?1 year(s) ? ?The format for your next appointment:   ?In Person ? ?Provider:   ?Jodelle Red, MD  ? ? ? ?

## 2021-10-25 ENCOUNTER — Encounter (HOSPITAL_BASED_OUTPATIENT_CLINIC_OR_DEPARTMENT_OTHER): Payer: Self-pay | Admitting: Cardiology

## 2021-10-31 ENCOUNTER — Encounter: Payer: Self-pay | Admitting: Pulmonary Disease

## 2021-10-31 ENCOUNTER — Ambulatory Visit (INDEPENDENT_AMBULATORY_CARE_PROVIDER_SITE_OTHER): Payer: Medicare Other | Admitting: Pulmonary Disease

## 2021-10-31 ENCOUNTER — Other Ambulatory Visit: Payer: Self-pay

## 2021-10-31 VITALS — BP 110/60 | HR 91 | Temp 98.3°F | Ht 75.0 in | Wt 328.0 lb

## 2021-10-31 DIAGNOSIS — R0602 Shortness of breath: Secondary | ICD-10-CM

## 2021-10-31 DIAGNOSIS — J4530 Mild persistent asthma, uncomplicated: Secondary | ICD-10-CM | POA: Diagnosis not present

## 2021-10-31 MED ORDER — LEVALBUTEROL TARTRATE 45 MCG/ACT IN AERO: 2.0000 | INHALATION_SPRAY | RESPIRATORY_TRACT | 3 refills | Status: DC | PRN

## 2021-10-31 NOTE — Progress Notes (Signed)
? ? ?Subjective:  ? ?PATIENT ID: Brandon Sanchez GENDER: male DOB: 25-[name]-1946, MRN: [id number] ? ? ?HPI ? ?Chief Complaint  ?Patient presents with  ? Consult  ?  Patient states that he has shortness of breath with exertion and hx of asthma. Denies cough  ? ? ?Reason for Visit: New consult for shortness of breath ? ?Brandon Sanchez is a 76 year old male with asthma, allergic rhinitis, atrial fibrillation s/p ablation, hypertension, hyperlipidemia, type 2 diabetes, hypothyroidism, CKD stage II, depression who presents as a new consult for shortness of breath. ? ?He was referred by Dr.Pharr, PCP, for asthma.  Per telephone note on 10/23/2021, patient was recently seen on follow-up by his cardiologist for atrial fibrillation with note concerns however continues to have shortness of breath on exertion.  Patient does have asthma and was previously on Advair and has switched to [facility] ? ?He was previously seen by at [facility] in 2012 for asthma and last seen in 2016 when his symptoms were overall well-controlled so managed by his PCP since then. He has had breathing issues in his 57s noticed when jogging and unable to keep with his peers at the time. Since being diagnosed with atrial fibrillation in 2020 he has had worsening shortness of breath with activity. Has DOE when walking 20 feet to to the mailbox. Associated with chest pain sometimes and neck pain with heavy exertion. Denies coughing or wheezing. He is using Breo daily. He does not use albuterol. Sedentary at home. He has seen his cardiologist ? ?Weight 270 lbs during sleep study 11/24/19 ?Current weight 328 lbs today ? ?Asthma Control Test ACT Total Score  ?10/31/2021 17  ? ?Social History: ?Never smoker ? ?I have personally reviewed patient's past medical/family/social history, allergies, current medications. ? ?Past Medical History:  ?Diagnosis Date  ? Allergic rhinitis, cause unspecified   ? Asthma   ? Cataract cortical, senile, bilateral   ? Macular  degeneration of left eye   ? Other and unspecified hyperlipidemia   ? Type II or unspecified type diabetes mellitus without mention of complication, not stated as uncontrolled   ? Unspecified essential hypertension   ?  ? ?Family History  ?Problem Relation Age of Onset  ? Colon cancer Father   ? Heart disease Paternal Grandfather   ?  ? ?Social History  ? ?Occupational History  ? Occupation: Counsellor  ?Tobacco Use  ? Smoking status: Never  ? Smokeless tobacco: Never  ?Vaping Use  ? Vaping Use: Never used  ?Substance and Sexual Activity  ? Alcohol use: Not Currently  ?  Comment: Used to drink 2 L bottle of wine/day. Quit 09/18/19  ? Drug use: Never  ? Sexual activity: Not Currently  ? ? ?No Known Allergies  ? ?Outpatient Medications Prior to Visit  ?Medication Sig Dispense Refill  ? albuterol (PROAIR HFA) 108 (90 BASE) MCG/ACT inhaler 2 puffs every 4 hours as needed only  if your can't catch your breath 1 Inhaler 1  ? aspirin EC 81 MG EC tablet Take 1 tablet (81 mg total) by mouth daily. 60 tablet 0  ? atorvastatin (LIPITOR) 80 MG tablet Take 1 tablet (80 mg total) by mouth every evening. 30 tablet 0  ? B-D ULTRAFINE III SHORT PEN 31G X 8 MM MISC SMARTSIG:Pre-Filled Pen Syringe SUB-Q 3 Times Daily    ? BREO ELLIPTA 200-25 MCG/INH AEPB Inhale 1 puff into the lungs daily as needed (shortness of breath).     ? Cholecalciferol (VITAMIN D3)  50 MCG (2000 UT) TABS Take 50 mcg by mouth daily.    ? ELIQUIS 5 MG TABS tablet TAKE 1 TABLET(5 MG) BY MOUTH TWICE DAILY 180 tablet 2  ? furosemide (LASIX) 40 MG tablet Take 1 tablet (40 mg total) by mouth daily. 90 tablet 3  ? Multiple Vitamin (MULTIVITAMIN WITH MINERALS) TABS tablet Take 1 tablet by mouth daily.    ? MYRBETRIQ 25 MG TB24 tablet Take 25 mg by mouth daily.    ? NOVOLOG MIX 70/30 FLEXPEN (70-30) 100 UNIT/ML FlexPen Inject 60 Units into the skin in the morning and at bedtime.    ? ONETOUCH VERIO test strip 1 each by Other route in the morning, at noon, and at  bedtime.     ? polyethylene glycol (MIRALAX / GLYCOLAX) 17 g packet Take 17 g by mouth daily. 14 each 0  ? PREVIDENT 5000 BOOSTER PLUS 1.1 % PSTE Place 1 application onto teeth in the morning and at bedtime.     ? sertraline (ZOLOFT) 100 MG tablet Take 100 mg by mouth daily.    ? ?No facility-administered medications prior to visit.  ? ? ?Review of Systems  ?Constitutional:  Negative for chills, diaphoresis, fever, malaise/fatigue and weight loss.  ?HENT:  Negative for congestion.   ?Respiratory:  Positive for shortness of breath. Negative for cough, hemoptysis, sputum production and wheezing.   ?Cardiovascular:  Positive for chest pain. Negative for palpitations and leg swelling.  ? ? ?Objective:  ? ?Vitals:  ? 10/31/21 0910  ?BP: 110/60  ?Pulse: 91  ?Temp: 98.3 ?F (36.8 ?C)  ?TempSrc: Oral  ?SpO2: 96%  ?Weight: (!) 328 lb (148.8 kg)  ?Height: 6\' 3"  (1.905 m)  ? ?SpO2: 96 % ?O2 Device: None (Room air) ? ?Physical Exam: ?General: Well-appearing, no acute distress ?HENT: [place], AT ?Eyes: EOMI, no scleral icterus ?Respiratory: Clear to auscultation bilaterally.  No crackles, wheezing or rales ?Cardiovascular: RRR, -M/R/G, no JVD ?Extremities:-Edema,-tenderness ?Neuro: AAO x4, CNII-XII grossly intact ?Psych: Normal mood, normal affect ? ?Data Reviewed: ? ?Imaging: ?CT Cardiac 03/10/20 Lung overread - Subsegemental atelectasis. Small left pleural effusion. No pulmonary nodules, infiltrate or parenchymal changes. ? ?PFT: ?None on file ? ?Sleep: ?Sleep study 11/24/19 - No significant OSA if patient avoids supine position (AHI 1.9). Supine position occurred 2.9% of sleep time with severe OSA (AHI 54.5) ? ?Labs: ?OSH absolute eos 06/29/21-500 ?   ?Assessment & Plan:  ? ?Discussion: ?76 year old male with asthma, allergic rhinitis, atrial fibrillation s/p ablation, hypertension, hyperlipidemia, type 2 diabetes, hypothyroidism, CKD stage II, depression who presents as a new consult for shortness of breath. His atrial fibrillation  has limited his activity so deconditioning could be contributing to his symptoms. Will further evaluate with PFTs to rule out underlying lung disease. Discussed clinical course and management of asthma including bronchodilator regimen and action plan for exacerbation. ? ?Mild persistent asthma ?Shortness of breath ?--ARRANGE pulmonary function tests ?--CONTINUE Breo 200-25 mcg ONE puff ONCE a day ?--START Xopenex TWO puffs AS NEEDED for shortness of breath. OK to use prior to exercise ?--Encouraged regular aerobic exercise daily. Goal work towards 30 minutes of activity daily ?--Consider home sleep study in future ? ?Health Maintenance ?Immunization History  ?Administered Date(s) Administered  ? Influenza Split 08/19/2012  ? Influenza Whole 08/19/2008  ? Influenza, High Dose Seasonal PF 09/18/2016  ? Influenza, Quadrivalent, Recombinant, Inj, Pf 04/25/2017, 05/21/2018, 05/24/2019, 05/23/2020, 05/09/2021  ? Influenza-Unspecified 05/08/2011, 06/12/2012, 04/23/2013  ? PFIZER(Purple Top)SARS-COV-2 Vaccination 10/25/2019, 11/08/2019, 04/05/2020  ? Pneumococcal  Conjugate-13 11/10/2013  ? Pneumococcal Polysaccharide-23 06/12/2012  ? Td 01/08/2018  ? Tdap 09/03/2007  ? Zoster Recombinat (Shingrix) 06/26/2020, 01/01/2021  ? Zoster, Live 10/25/2011  ? ?CT Lung Screen - not qualified. Never smoker ? ?Orders Placed This Encounter  ?Procedures  ? Pulmonary Function Test  ?  Standing Status:   Future  ?  Standing Expiration Date:   11/01/2022  ?  Order Specific Question:   Where should this test be performed?  ?  Answer:   [facility]  ?  Order Specific Question:   Full PFT: includes the following: basic spirometry, spirometry pre & post bronchodilator, diffusion capacity (DLCO), lung volumes  ?  Answer:   Full PFT  ? ?Meds ordered this encounter  ?Medications  ? levalbuterol (XOPENEX HFA) 45 MCG/ACT inhaler  ?  Sig: Inhale 2 puffs into the lungs every 4 (four) hours as needed for wheezing.  ?  Dispense:  1 each  ?  Refill:   3  ? ? ?No follow-ups on file. After PFTs ? ?I have spent a total time of 45-minutes on the day of the appointment reviewing prior documentation, coordinating care and discussing medical diagnosis and plan w

## 2021-10-31 NOTE — Patient Instructions (Addendum)
Mild persistent asthma ?Shortness of breath ?--ARRANGE pulmonary function tests ?--CONTINUE Breo 200-25 mcg ONE puff ONCE a day ?--START Xopenex TWO puffs AS NEEDED for shortness of breath. OK to use prior to exercise ?--Encouraged regular aerobic exercise daily. Goal work towards 30 minutes of activity daily ?--Consider home sleep study in future ? ?Follow-up with me in May with PFTs prior to visit ?

## 2021-11-02 DIAGNOSIS — I129 Hypertensive chronic kidney disease with stage 1 through stage 4 chronic kidney disease, or unspecified chronic kidney disease: Secondary | ICD-10-CM | POA: Diagnosis not present

## 2021-11-02 DIAGNOSIS — N1832 Chronic kidney disease, stage 3b: Secondary | ICD-10-CM | POA: Diagnosis not present

## 2021-11-02 DIAGNOSIS — N200 Calculus of kidney: Secondary | ICD-10-CM | POA: Diagnosis not present

## 2021-11-02 DIAGNOSIS — E1122 Type 2 diabetes mellitus with diabetic chronic kidney disease: Secondary | ICD-10-CM | POA: Diagnosis not present

## 2021-11-05 ENCOUNTER — Encounter: Payer: Self-pay | Admitting: Pulmonary Disease

## 2021-11-26 DIAGNOSIS — E1129 Type 2 diabetes mellitus with other diabetic kidney complication: Secondary | ICD-10-CM | POA: Diagnosis not present

## 2021-11-26 DIAGNOSIS — I10 Essential (primary) hypertension: Secondary | ICD-10-CM | POA: Diagnosis not present

## 2021-12-22 ENCOUNTER — Other Ambulatory Visit (HOSPITAL_COMMUNITY): Payer: Self-pay | Admitting: Cardiology

## 2021-12-24 NOTE — Telephone Encounter (Signed)
Prescription refill request for Eliquis received. ?Indication:Afib ?Last office visit:3/23 ?Scr:1.3 ?Age: 76 ?Weight:148.8 kg ? ?Prescription refilled ? ?

## 2022-01-01 ENCOUNTER — Ambulatory Visit (INDEPENDENT_AMBULATORY_CARE_PROVIDER_SITE_OTHER): Payer: Medicare Other | Admitting: Pulmonary Disease

## 2022-01-01 ENCOUNTER — Encounter: Payer: Self-pay | Admitting: Pulmonary Disease

## 2022-01-01 ENCOUNTER — Encounter: Payer: Self-pay | Admitting: Adult Health

## 2022-01-01 VITALS — BP 112/62 | HR 88 | Ht 73.0 in | Wt 323.0 lb

## 2022-01-01 DIAGNOSIS — R0602 Shortness of breath: Secondary | ICD-10-CM

## 2022-01-01 DIAGNOSIS — J4530 Mild persistent asthma, uncomplicated: Secondary | ICD-10-CM | POA: Diagnosis not present

## 2022-01-01 LAB — PULMONARY FUNCTION TEST
DL/VA % pred: 102 %
DL/VA: 4.04 ml/min/mmHg/L
DLCO cor % pred: 96 %
DLCO cor: 26.76 ml/min/mmHg
DLCO unc % pred: 96 %
DLCO unc: 26.76 ml/min/mmHg
FEF 25-75 Post: 1.61 L/sec
FEF 25-75 Pre: 1.06 L/sec
FEF2575-%Change-Post: 52 %
FEF2575-%Pred-Post: 62 %
FEF2575-%Pred-Pre: 41 %
FEV1-%Change-Post: 13 %
FEV1-%Pred-Post: 70 %
FEV1-%Pred-Pre: 61 %
FEV1-Post: 2.45 L
FEV1-Pre: 2.16 L
FEV1FVC-%Change-Post: 7 %
FEV1FVC-%Pred-Pre: 83 %
FEV6-%Change-Post: 6 %
FEV6-%Pred-Post: 81 %
FEV6-%Pred-Pre: 76 %
FEV6-Post: 3.67 L
FEV6-Pre: 3.44 L
FEV6FVC-%Change-Post: 0 %
FEV6FVC-%Pred-Post: 103 %
FEV6FVC-%Pred-Pre: 102 %
FVC-%Change-Post: 5 %
FVC-%Pred-Post: 78 %
FVC-%Pred-Pre: 73 %
FVC-Post: 3.74 L
FVC-Pre: 3.54 L
Post FEV1/FVC ratio: 66 %
Post FEV6/FVC ratio: 98 %
Pre FEV1/FVC ratio: 61 %
Pre FEV6/FVC Ratio: 97 %
RV % pred: 135 %
RV: 3.67 L
TLC % pred: 96 %
TLC: 7.36 L

## 2022-01-01 MED ORDER — FLUTICASONE-SALMETEROL 250-50 MCG/ACT IN AEPB: 1.0000 | INHALATION_SPRAY | Freq: Two times a day (BID) | RESPIRATORY_TRACT | 5 refills | Status: DC

## 2022-01-01 NOTE — Patient Instructions (Signed)
?  Mild persistent asthma - uncontrolled ?Shortness of breath ?--STOP Breo ?--START Advair 250-50 mcg ONE puff TWICE a day. Rinse mouth out after use ?--CONTINUE Xopenex TWO puffs AS NEEDED for shortness of breath. OK to use prior to exercise ?--Encouraged regular aerobic exercise daily. Goal work towards 30 minutes of activity daily ?--Consider home sleep study in future ?

## 2022-01-01 NOTE — Progress Notes (Signed)
PFT done today. 

## 2022-01-01 NOTE — Progress Notes (Signed)
? ? ?Subjective:  ? ?PATIENT ID: Brandon Sanchez GENDER: male DOB: 1945/10/07, MRN: [id number] ? ? ?HPI ? ?Chief Complaint  ?Patient presents with  ? Follow-up  ?  Pft results  ? ? ?Reason for Visit: Follow-up PFT ? ?Brandon Sanchez is a 76 year old male with asthma, allergic rhinitis, atrial fibrillation s/p ablation, hypertension, hyperlipidemia, type 2 diabetes, hypothyroidism, CKD stage II, depression who presents as a new consult for shortness of breath. ? ?Initial consult ?He was referred by Dr.Pharr, PCP, for asthma.  Per telephone note on 10/23/2021, patient was recently seen on follow-up by his cardiologist for atrial fibrillation with note concerns however continues to have shortness of breath on exertion.  Patient does have asthma and was previously on Advair and has switched to [facility]. ? ?He was previously seen by at [facility] in 2012 for asthma and last seen in 2016 when his symptoms were overall well-controlled so managed by his PCP since then. He has had breathing issues in his 57s noticed when jogging and unable to keep with his peers at the time. Since being diagnosed with atrial fibrillation in 2020 he has had worsening shortness of breath with activity. Has DOE when walking 20 feet to to the mailbox. Associated with chest pain sometimes and neck pain with heavy exertion. Denies coughing or wheezing. He is using Breo daily. He does not use albuterol. Sedentary at home. He has seen his cardiologist ? ?01/01/22 ?Compliant with Breo. He reports he continues to have shortness of breath with exertion. Walking short distances to his mailbox will wind him. Does not use his albuterol inhaler even though he has symptoms frequent. [facility]. Denies nocturnal symptoms. No exacerbations since our last visit. ? ?Weight 270 lbs during sleep study 11/24/19 ?Current weight 328 lbs today ? ?Asthma Control Test ACT Total Score  ?10/31/2021 ? 9:08 AM 17  ? ?Social  History: ?Never smoker ? ?Past Medical History:  ?Diagnosis Date  ? Allergic rhinitis, cause unspecified   ? Asthma   ? Cataract cortical, senile, bilateral   ? Macular degeneration of left eye   ? Other and unspecified hyperlipidemia   ? Type II or unspecified type diabetes mellitus without mention of complication, not stated as uncontrolled   ? Unspecified essential hypertension   ?  ? ?Family History  ?Problem Relation Age of Onset  ? Colon cancer Father   ? Heart disease Paternal Grandfather   ?  ? ?Social History  ? ?Occupational History  ? Occupation: Counsellor  ?Tobacco Use  ? Smoking status: Never  ? Smokeless tobacco: Never  ?Vaping Use  ? Vaping Use: Never used  ?Substance and Sexual Activity  ? Alcohol use: Not Currently  ?  Comment: Used to drink 2 L bottle of wine/day. Quit 09/18/19  ? Drug use: Never  ? Sexual activity: Not Currently  ? ? ?No Known Allergies  ? ?Outpatient Medications Prior to Visit  ?Medication Sig Dispense Refill  ? albuterol (PROAIR HFA) 108 (90 BASE) MCG/ACT inhaler 2 puffs every 4 hours as needed only  if your can't catch your breath 1 Inhaler 1  ? aspirin EC 81 MG EC tablet Take 1 tablet (81 mg total) by mouth daily. 60 tablet 0  ? atorvastatin (LIPITOR) 80 MG tablet Take 1 tablet (80 mg total) by mouth every evening. 30 tablet 0  ? B-D ULTRAFINE III SHORT PEN 31G X 8 MM MISC SMARTSIG:Pre-Filled Pen Syringe SUB-Q 3 Times  Daily    ? BREO ELLIPTA 200-25 MCG/INH AEPB Inhale 1 puff into the lungs daily as needed (shortness of breath).     ? Cholecalciferol (VITAMIN D3) 50 MCG (2000 UT) TABS Take 50 mcg by mouth daily.    ? ELIQUIS 5 MG TABS tablet TAKE 1 TABLET(5 MG) BY MOUTH TWICE DAILY 180 tablet 2  ? furosemide (LASIX) 40 MG tablet Take 1 tablet (40 mg total) by mouth daily. 90 tablet 3  ? levalbuterol (XOPENEX HFA) 45 MCG/ACT inhaler Inhale 2 puffs into the lungs every 4 (four) hours as needed for wheezing. 1 each 3  ? Multiple Vitamin (MULTIVITAMIN WITH MINERALS) TABS  tablet Take 1 tablet by mouth daily.    ? MYRBETRIQ 25 MG TB24 tablet Take 25 mg by mouth daily.    ? NOVOLOG MIX 70/30 FLEXPEN (70-30) 100 UNIT/ML FlexPen Inject 60 Units into the skin in the morning and at bedtime.    ? ONETOUCH VERIO test strip 1 each by Other route in the morning, at noon, and at bedtime.     ? polyethylene glycol (MIRALAX / GLYCOLAX) 17 g packet Take 17 g by mouth daily. 14 each 0  ? PREVIDENT 5000 BOOSTER PLUS 1.1 % PSTE Place 1 application onto teeth in the morning and at bedtime.     ? sertraline (ZOLOFT) 100 MG tablet Take 100 mg by mouth daily.    ? ?No facility-administered medications prior to visit.  ? ? ?Review of Systems  ?Constitutional:  Negative for chills, diaphoresis, fever, malaise/fatigue and weight loss.  ?HENT:  Negative for congestion.   ?Respiratory:  Positive for shortness of breath. Negative for cough, hemoptysis, sputum production and wheezing.   ?Cardiovascular:  Negative for chest pain, palpitations and leg swelling.  ? ? ?Objective:  ? ?Vitals:  ? 01/01/22 0956  ?BP: 112/62  ?Pulse: 88  ?SpO2: 97%  ?Weight: (!) 323 lb (146.5 kg)  ?Height: 6\' 1"  (1.854 m)  ? ?SpO2: 97 % ?O2 Device: None (Room air) ? ?Physical Exam: ?General: Well-appearing, no acute distress ?HENT: [place], AT ?Eyes: EOMI, no scleral icterus ?Respiratory: Clear to auscultation bilaterally.  No crackles, wheezing or rales ?Cardiovascular: RRR, -M/R/G, no JVD ?Extremities:-Edema,-tenderness ?Neuro: AAO x4, CNII-XII grossly intact ?Psych: Normal mood, normal affect ? ?Data Reviewed: ? ?Imaging: ?CT Cardiac 03/10/20 Lung overread - Subsegemental atelectasis. Small left pleural effusion. No pulmonary nodules, infiltrate or parenchymal changes. ? ?PFT: ?01/01/22 ?FVC 3.54 (73%) FEV1 2.45 (70%) Ratio 61  TLC 96% RV 135% DLCO 96% ?Interpretation: Mild obstructive defect with significant bronchodilator response consistent with asthma. Air trapping present. ? ?Sleep: ?Sleep study 11/24/19 - No significant OSA if patient  avoids supine position (AHI 1.9). Supine position occurred 2.9% of sleep time with severe OSA (AHI 54.5) ? ?Labs: ?OSH absolute eos 06/29/21-500 ?   ?Assessment & Plan:  ? ?Discussion: ?75 year old male with asthma, allergic rhinitis, atrial fibrillation s/p ablation, hypertension, hyperlipidemia, type 2 diabetes, hypothyroidism, CKD stage II, depression who presents for follow-up for shortness of breath.  His atrial fibrillation is limited his activities of deconditioning could be contributing to his symptoms.  PFTs were reviewed consistent with asthma. Discussed clinical course and management of asthma including bronchodilator regimen and action plan for exacerbation. ? ?Mild persistent asthma - uncontrolled ?Shortness of breath ?--STOP Breo ?--START Advair 250-50 mcg ONE puff TWICE a day ?--CONTINUE Xopenex TWO puffs AS NEEDED for shortness of breath. OK to use prior to exercise ?--Encouraged regular aerobic exercise daily. Goal work towards 30  minutes of activity daily ?--Consider home sleep study in future ? ?Health Maintenance ?Immunization History  ?Administered Date(s) Administered  ? Influenza Split 08/19/2012  ? Influenza Whole 08/19/2008  ? Influenza, High Dose Seasonal PF 09/18/2016  ? Influenza, Quadrivalent, Recombinant, Inj, Pf 04/25/2017, 05/21/2018, 05/24/2019, 05/23/2020, 05/09/2021  ? Influenza-Unspecified 05/08/2011, 06/12/2012, 04/23/2013  ? PFIZER(Purple Top)SARS-COV-2 Vaccination 10/25/2019, 11/08/2019, 04/05/2020  ? Pneumococcal Conjugate-13 11/10/2013  ? Pneumococcal Polysaccharide-23 06/12/2012  ? Td 01/08/2018  ? Tdap 09/03/2007  ? Zoster Recombinat (Shingrix) 06/26/2020, 01/01/2021  ? Zoster, Live 10/25/2011  ? ?No orders of the defined types were placed in this encounter. ? ?Meds ordered this encounter  ?Medications  ? fluticasone-salmeterol (ADVAIR DISKUS) 250-50 MCG/ACT AEPB  ?  Sig: Inhale 1 puff into the lungs in the morning and at bedtime.  ?  Dispense:  60 each  ?  Refill:  5   ? ? ?Return in about 3 months (around 04/03/2022).  ? ?I have spent a total time of 32-minutes on the day of the appointment reviewing prior documentation, coordinating care and discussing medical diagnosis and plan with

## 2022-01-07 ENCOUNTER — Telehealth: Payer: Self-pay | Admitting: General Practice

## 2022-01-07 NOTE — Telephone Encounter (Signed)
Sleep study assessment reviewed from 11/24/19. Split night was ordered however did not meet split night criteria. Did not have sufficient respiratory events to warrant CPAP trial per standing protocol.

## 2022-01-07 NOTE — Telephone Encounter (Signed)
Called [facility] to follow-up with partial fax re split night sleep study. Only received 4 of 16 pages.  Left message with medical records. [place], [place]

## 2022-04-08 ENCOUNTER — Ambulatory Visit (INDEPENDENT_AMBULATORY_CARE_PROVIDER_SITE_OTHER): Payer: Medicare Other | Admitting: Pulmonary Disease

## 2022-04-08 ENCOUNTER — Encounter: Payer: Self-pay | Admitting: Pulmonary Disease

## 2022-04-08 VITALS — BP 130/60 | HR 85 | Temp 98.0°F | Ht 74.0 in | Wt 326.0 lb

## 2022-04-08 DIAGNOSIS — J4530 Mild persistent asthma, uncomplicated: Secondary | ICD-10-CM | POA: Diagnosis not present

## 2022-04-08 MED ORDER — FLUTICASONE-SALMETEROL 250-50 MCG/ACT IN AEPB: 1.0000 | INHALATION_SPRAY | Freq: Two times a day (BID) | RESPIRATORY_TRACT | 5 refills | Status: DC

## 2022-04-08 NOTE — Progress Notes (Signed)
Subjective:   PATIENT ID: Brandon Sanchez GENDER: male DOB: 17-Apr-1946, MRN: [id number]   HPI  Chief Complaint  Patient presents with   Follow-up    asthma    Reason for Visit: Follow-up PFT  Brandon Sanchez is a 76 year old male with asthma, allergic rhinitis, atrial fibrillation s/p ablation, hypertension, hyperlipidemia, type 2 diabetes, hypothyroidism, CKD stage II, depression who presents for follow-up  Initial consult He was initially referred by Dr.Pharr, PCP, for asthma.  Per telephone note on 10/23/2021, patient was recently seen on follow-up by his cardiologist for atrial fibrillation with note concerns however continues to have shortness of breath on exertion.  Patient does have asthma and was previously on Advair and has switched to [facility].  He was previously seen by at [facility] in 2012 for asthma and last seen in 2016 when his symptoms were overall well-controlled so managed by his PCP since then. He has had breathing issues in his 61s noticed when jogging and unable to keep with his peers at the time. Since being diagnosed with atrial fibrillation in 2020 he has had worsening shortness of breath with activity. Has DOE when walking 20 feet to to the mailbox. Associated with chest pain sometimes and neck pain with heavy exertion. Denies coughing or wheezing. He is using Breo daily. He does not use albuterol. Sedentary at home. He has seen his cardiologist  01/01/22 Compliant with Breo. He reports he continues to have shortness of breath with exertion. Walking short distances to his mailbox will wind him. Does not use his albuterol inhaler even though he has symptoms frequent. [facility]. Denies nocturnal symptoms. No exacerbations since our last visit.  04/08/22 Since our last visit he had stopped Breo. Has felt Advair improved shortness of breath. Denies coughing or wheezing. Able to walk short distances and [facility]. Only has symptoms with moderate-heavy exertion but recovers with rest. Does not use xopenex. Will use albuterol proactively with exercise.  Weight 270 lbs during sleep study 11/24/19 Current weight 328 lbs today  Asthma Control Test ACT Total Score  04/08/2022  8:57 AM 18  10/31/2021  9:08 AM 17   Prior inhalers: Breo - ineffective. Improved on higher ICS dosing  Social History: Never smoker  Past Medical History:  Diagnosis Date   Allergic rhinitis, cause unspecified    Asthma    Cataract cortical, senile, bilateral    Macular degeneration of left eye    Other and unspecified hyperlipidemia    Type II or unspecified type diabetes mellitus without mention of complication, not stated as uncontrolled    Unspecified essential hypertension      Family History  Problem Relation Age of Onset   Colon cancer Father    Heart disease Paternal Grandfather      Social History   Occupational History   Occupation: [employer] logistics/support/administrative officer  Tobacco Use   Smoking status: Never   Smokeless tobacco: Never  Vaping Use   Vaping Use: Never used  Substance and Sexual Activity   Alcohol use: Not Currently    Comment: Used to drink 2 L bottle of wine/day. Quit 09/18/19   Drug use: Never   Sexual activity: Not Currently    No Known Allergies   Outpatient Medications Prior to Visit  Medication Sig Dispense Refill   aspirin EC 81 MG EC tablet Take 1 tablet (81 mg total) by mouth daily. 60 tablet  0   atorvastatin (LIPITOR) 80 MG tablet Take 1 tablet (80 mg total) by mouth every evening. 30 tablet 0   B-D ULTRAFINE III SHORT PEN 31G X 8 MM MISC SMARTSIG:Pre-Filled Pen Syringe SUB-Q 3 Times Daily     Cholecalciferol (VITAMIN D3) 50 MCG (2000 UT) TABS Take 50 mcg by mouth daily.     ELIQUIS 5 MG TABS tablet TAKE 1 TABLET(5 MG) BY MOUTH TWICE DAILY 180 tablet 2   furosemide (LASIX) 40 MG tablet Take 1 tablet (40 mg total) by mouth daily. 90 tablet 3   Multiple Vitamin  (MULTIVITAMIN WITH MINERALS) TABS tablet Take 1 tablet by mouth daily.     MYRBETRIQ 25 MG TB24 tablet Take 25 mg by mouth daily.     NOVOLOG MIX 70/30 FLEXPEN (70-30) 100 UNIT/ML FlexPen Inject 60 Units into the skin in the morning and at bedtime.     ONETOUCH VERIO test strip 1 each by Other route in the morning, at noon, and at bedtime.      polyethylene glycol (MIRALAX / GLYCOLAX) 17 g packet Take 17 g by mouth daily. 14 each 0   PREVIDENT 5000 BOOSTER PLUS 1.1 % PSTE Place 1 application onto teeth in the morning and at bedtime.      sertraline (ZOLOFT) 100 MG tablet Take 100 mg by mouth daily.     fluticasone-salmeterol (ADVAIR DISKUS) 250-50 MCG/ACT AEPB Inhale 1 puff into the lungs in the morning and at bedtime. 60 each 5   albuterol (PROAIR HFA) 108 (90 BASE) MCG/ACT inhaler 2 puffs every 4 hours as needed only  if your can't catch your breath 1 Inhaler 1   BREO ELLIPTA 200-25 MCG/INH AEPB Inhale 1 puff into the lungs daily as needed (shortness of breath).      levalbuterol (XOPENEX HFA) 45 MCG/ACT inhaler Inhale 2 puffs into the lungs every 4 (four) hours as needed for wheezing. 1 each 3   No facility-administered medications prior to visit.    Review of Systems  Constitutional:  Negative for chills, diaphoresis, fever, malaise/fatigue and weight loss.  HENT:  Negative for congestion.   Respiratory:  Positive for shortness of breath. Negative for cough, hemoptysis, sputum production and wheezing.   Cardiovascular:  Negative for chest pain, palpitations and leg swelling.     Objective:   Vitals:   04/08/22 0859  BP: 130/60  Pulse: 85  Temp: 98 F (36.7 C)  TempSrc: Oral  SpO2: 96%  Weight: (!) 326 lb (147.9 kg)  Height: 6\' 2"  (1.88 m)   SpO2: 96 % O2 Device: None (Room air)  Physical Exam: General: Well-appearing, no acute distress HENT: , AT Eyes: EOMI, no scleral icterus Respiratory: Clear to auscultation bilaterally.  No crackles, wheezing or  rales Cardiovascular: RRR, -M/R/G, no JVD Extremities:-Edema,-tenderness Neuro: AAO x4, CNII-XII grossly intact Psych: Normal mood, normal affect  Data Reviewed:  Imaging: CT Cardiac 03/10/20 Lung overread - Subsegemental atelectasis. Small left pleural effusion. No pulmonary nodules, infiltrate or parenchymal changes.  PFT: 01/01/22 FVC 3.54 (73%) FEV1 2.45 (70%) Ratio 61  TLC 96% RV 135% DLCO 96% Interpretation: Mild obstructive defect with significant bronchodilator response consistent with asthma. Air trapping present.  Sleep: Sleep study 11/24/19 - No significant OSA if patient avoids supine position (AHI 1.9). Supine position occurred 2.9% of sleep time with severe OSA (AHI 54.5)  Labs: OSH absolute eos 06/29/21-500    Assessment & Plan:   Discussion: 76 year old male with asthma, allergic rhinitis, atrial fibrillation s/p ablation, HTN,  HLD, type 2 diabetes, hypothyroidism, CKD stage II, depression who presents for follow-up for shortness of breath.  Symptoms on increase ICS/LABA have improved. Discussed clinical course and management of asthma including bronchodilator regimen and action plan for exacerbation.  Mild persistent asthma - improved --CONTINUE Advair 250-50 mcg ONE puff TWICE a day --CONTINUE Albuterol TWO puffs AS NEEDED for shortness of breath. OK to use prior to exercise --STOP Xopenex (this is a duplicate with less side effects) --Encouraged regular aerobic exercise daily. Goal work towards 30 minutes of activity daily --Consider home sleep study in the future if symptoms worsen. Ok as long he avoids supine position  Health Maintenance Immunization History  Administered Date(s) Administered   Influenza Split 08/19/2012   Influenza Whole 08/19/2008   Influenza, High Dose Seasonal PF 09/18/2016   Influenza, Quadrivalent, Recombinant, Inj, Pf 04/25/2017, 05/21/2018, 05/24/2019, 05/23/2020, 05/09/2021   Influenza-Unspecified 05/08/2011, 06/12/2012, 04/23/2013    PFIZER(Purple Top)SARS-COV-2 Vaccination 10/25/2019, 11/08/2019, 04/05/2020   Pneumococcal Conjugate-13 11/10/2013   Pneumococcal Polysaccharide-23 06/12/2012   Td 01/08/2018   Tdap 09/03/2007   Zoster Recombinat (Shingrix) 06/26/2020, 01/01/2021   Zoster, Live 10/25/2011   No orders of the defined types were placed in this encounter.  Meds ordered this encounter  Medications   fluticasone-salmeterol (ADVAIR DISKUS) 250-50 MCG/ACT AEPB    Sig: Inhale 1 puff into the lungs in the morning and at bedtime.    Dispense:  60 each    Refill:  5    Return in about 6 months (around 10/09/2022).   I have spent a total time of 31-minutes on the day of the appointment including chart review, data review, collecting history, coordinating care and discussing medical diagnosis and plan with the patient/family. Past medical history, allergies, medications were reviewed. Pertinent imaging, labs and tests included in this note have been reviewed and interpreted independently by me.  Betta Balla Rodman Pickle, MD [facility] Office Number [phone]

## 2022-04-08 NOTE — Patient Instructions (Addendum)
Mild persistent asthma - improved --CONTINUE Advair 250-50 mcg ONE puff TWICE a day --CONTINUE Albuterol TWO puffs AS NEEDED for shortness of breath. OK to use prior to exercise --STOP Xopenex (this is a duplicate with less side effects) --Encouraged regular aerobic exercise daily. Goal work towards 30 minutes of activity daily  Follow-up with me in 6 months

## 2022-04-10 ENCOUNTER — Encounter: Payer: Self-pay | Admitting: Pulmonary Disease

## 2022-05-23 ENCOUNTER — Encounter: Payer: Self-pay | Admitting: Pulmonary Disease

## 2022-05-23 ENCOUNTER — Encounter (HOSPITAL_BASED_OUTPATIENT_CLINIC_OR_DEPARTMENT_OTHER): Payer: Self-pay

## 2022-05-23 NOTE — Telephone Encounter (Signed)
Please advise 

## 2022-05-23 NOTE — Telephone Encounter (Signed)
Dr. Loanne Drilling, please advise on pt's message. Questioning how he should take/order and spacing between COVID booster, flu and RSV vaccine. Thanks.

## 2022-05-28 DIAGNOSIS — E1129 Type 2 diabetes mellitus with other diabetic kidney complication: Secondary | ICD-10-CM | POA: Diagnosis not present

## 2022-05-28 DIAGNOSIS — Z23 Encounter for immunization: Secondary | ICD-10-CM | POA: Diagnosis not present

## 2022-06-07 ENCOUNTER — Other Ambulatory Visit: Payer: Self-pay | Admitting: Pulmonary Disease

## 2022-06-26 DIAGNOSIS — N200 Calculus of kidney: Secondary | ICD-10-CM | POA: Diagnosis not present

## 2022-06-26 DIAGNOSIS — Z125 Encounter for screening for malignant neoplasm of prostate: Secondary | ICD-10-CM | POA: Diagnosis not present

## 2022-06-26 DIAGNOSIS — R3915 Urgency of urination: Secondary | ICD-10-CM | POA: Diagnosis not present

## 2022-06-27 DIAGNOSIS — E1129 Type 2 diabetes mellitus with other diabetic kidney complication: Secondary | ICD-10-CM | POA: Diagnosis not present

## 2022-06-27 DIAGNOSIS — R3915 Urgency of urination: Secondary | ICD-10-CM | POA: Diagnosis not present

## 2022-06-27 DIAGNOSIS — E039 Hypothyroidism, unspecified: Secondary | ICD-10-CM | POA: Diagnosis not present

## 2022-07-01 DIAGNOSIS — F331 Major depressive disorder, recurrent, moderate: Secondary | ICD-10-CM | POA: Diagnosis not present

## 2022-07-01 DIAGNOSIS — J4540 Moderate persistent asthma, uncomplicated: Secondary | ICD-10-CM | POA: Diagnosis not present

## 2022-07-01 DIAGNOSIS — E039 Hypothyroidism, unspecified: Secondary | ICD-10-CM | POA: Diagnosis not present

## 2022-07-01 DIAGNOSIS — Z23 Encounter for immunization: Secondary | ICD-10-CM | POA: Diagnosis not present

## 2022-07-01 DIAGNOSIS — R251 Tremor, unspecified: Secondary | ICD-10-CM | POA: Diagnosis not present

## 2022-07-01 DIAGNOSIS — Z0000 Encounter for general adult medical examination without abnormal findings: Secondary | ICD-10-CM | POA: Diagnosis not present

## 2022-07-01 DIAGNOSIS — I480 Paroxysmal atrial fibrillation: Secondary | ICD-10-CM | POA: Diagnosis not present

## 2022-07-01 DIAGNOSIS — R3915 Urgency of urination: Secondary | ICD-10-CM | POA: Diagnosis not present

## 2022-07-01 DIAGNOSIS — E1129 Type 2 diabetes mellitus with other diabetic kidney complication: Secondary | ICD-10-CM | POA: Diagnosis not present

## 2022-07-01 DIAGNOSIS — R0609 Other forms of dyspnea: Secondary | ICD-10-CM | POA: Diagnosis not present

## 2022-07-01 DIAGNOSIS — R252 Cramp and spasm: Secondary | ICD-10-CM | POA: Diagnosis not present

## 2022-07-01 DIAGNOSIS — I10 Essential (primary) hypertension: Secondary | ICD-10-CM | POA: Diagnosis not present

## 2022-07-03 ENCOUNTER — Telehealth (HOSPITAL_BASED_OUTPATIENT_CLINIC_OR_DEPARTMENT_OTHER): Payer: Self-pay | Admitting: Cardiology

## 2022-07-03 ENCOUNTER — Encounter (HOSPITAL_BASED_OUTPATIENT_CLINIC_OR_DEPARTMENT_OTHER): Payer: Self-pay

## 2022-07-03 NOTE — Telephone Encounter (Signed)
Spoke with daughter regarding patient  Patient has been having shortness of breath and chest pain with exertion  Per daughter. did see pulmonologist who felt it was a lung issue  Inhaler helps with symptoms  Explained to daughter that when Dr Cristal Deer saw patient in March was having shortness of breath, asked if worse. Per daughter shortness of breath no worse but no better He does not weigh himself daily and daughter does not think any swelling Confirmed taking Lasix 40 mg daily  Patient did see PCP recently who did labs, BNP elevated Mychart message to patient asking to bring copy of labs to follow up  Advised daughter if patient develops chest pain and it does not go away go to ED for evaluation. Also take it easy until seen in office next week and had added to cancellation list

## 2022-07-03 NOTE — Telephone Encounter (Signed)
Daughter called concerned that the patient has had additional blood work done and wants to know if the patient needs to be seen sooner.  Daughter stated the patient is currently not having chest pains.  Patient has appointment scheduled on 11/21.

## 2022-07-09 ENCOUNTER — Ambulatory Visit (INDEPENDENT_AMBULATORY_CARE_PROVIDER_SITE_OTHER): Payer: Medicare Other | Admitting: Cardiology

## 2022-07-09 ENCOUNTER — Encounter (HOSPITAL_BASED_OUTPATIENT_CLINIC_OR_DEPARTMENT_OTHER): Payer: Self-pay | Admitting: Cardiology

## 2022-07-09 VITALS — BP 132/62 | Ht 74.0 in | Wt 322.2 lb

## 2022-07-09 DIAGNOSIS — D6869 Other thrombophilia: Secondary | ICD-10-CM | POA: Diagnosis not present

## 2022-07-09 DIAGNOSIS — I480 Paroxysmal atrial fibrillation: Secondary | ICD-10-CM

## 2022-07-09 DIAGNOSIS — I350 Nonrheumatic aortic (valve) stenosis: Secondary | ICD-10-CM

## 2022-07-09 DIAGNOSIS — Z8673 Personal history of transient ischemic attack (TIA), and cerebral infarction without residual deficits: Secondary | ICD-10-CM

## 2022-07-09 DIAGNOSIS — R0609 Other forms of dyspnea: Secondary | ICD-10-CM | POA: Diagnosis not present

## 2022-07-09 DIAGNOSIS — R079 Chest pain, unspecified: Secondary | ICD-10-CM

## 2022-07-09 NOTE — Progress Notes (Unsigned)
Cardiology Office Note:    Date:  07/10/2022   ID:  Brandon Sanchez, DOB [birth year], MRN [id number]  PCP:  Deland Pretty, MD  Cardiologist:  Buford Dresser, MD  EP: Dr. Curt Bears  Referring MD: Deland Pretty, MD   CC: follow up  History of Present Illness:    Brandon Sanchez is a 76 y.o. male with a hx of asthma, type II diabetes, hypertension, hyperlipidemia, atrial fibrillation, CVA who is seen for follow up.   Cardiac history: admitted 09/19/19 with atrial fibrillation with RVR. Has known history of type II diabetes, hypertension, and hyperlipidemia. He was also treated for community acquired pneumonia during that admission. He was started on anticoagulation, and rate control was attempted. Course was complicated by hypotension. He underwent TEE-CV with initial return to sinus rhythm. However after discharge, he returned to afib with RVR.   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 Hospital notes reviewed. His presenting symptoms were acute left sided weakness and facial droop. He was outside the tPA window. He was continued on apixaban and atorvastatin (increased from 40 mg to 80 mg), with aspirin added. He completed an additional week of inpatient rehab prior to returning home.  S/p afib ablation by Dr. Curt Bears on 03/17/20.   Today: Feels that breathing has progressively gotten worse since March of this year. Feels that if he walks more than 15 yards, his shortness of breath limits him and his chest gets tight. Follows with Dr. Loanne Drilling in pulmonology for his asthma. Started advair.  Saw Dr. Shelia Media, recommended to be evaluated by cardiology prior to intensifying his exercise regimen. We discussed options for this today, see below.  Denies shortness of breath at rest or with normal exertion. No PND, orthopnea, LE edema or unexpected weight gain. No syncope or palpitations.   Past Medical History:  Diagnosis Date   Allergic  rhinitis, cause unspecified    Asthma    Cataract cortical, senile, bilateral    Macular degeneration of left eye    Other and unspecified hyperlipidemia    Type II or unspecified type diabetes mellitus without mention of complication, not stated as uncontrolled    Unspecified essential hypertension     Past Surgical History:  Procedure Laterality Date   ATRIAL FIBRILLATION ABLATION N/A 03/17/2020   Procedure: ATRIAL FIBRILLATION ABLATION;  Surgeon: Constance Haw, MD;  Location: [facility];  Service: Cardiovascular;  Laterality: N/A;   CARDIOVERSION N/A 09/22/2019   Procedure: CARDIOVERSION;  Surgeon: Donato Heinz, MD;  Location: [facility] ENDOSCOPY;  Service: Endoscopy;  Laterality: N/A;   CARDIOVERSION N/A 10/18/2019   Procedure: CARDIOVERSION;  Surgeon: Jerline Pain, MD;  Location: [facility] ENDOSCOPY;  Service: Cardiovascular;  Laterality: N/A;   CATARACT EXTRACTION, BILATERAL  08/2020   TEE WITHOUT CARDIOVERSION N/A 09/22/2019   Procedure: TRANSESOPHAGEAL ECHOCARDIOGRAM (TEE);  Surgeon: Donato Heinz, MD;  Location: [facility] ENDOSCOPY;  Service: Endoscopy;  Laterality: N/A;    Current Medications: Current Outpatient Medications on File Prior to Visit  Medication Sig   aspirin EC 81 MG EC tablet Take 1 tablet (81 mg total) by mouth daily.   atorvastatin (LIPITOR) 80 MG tablet Take 1 tablet (80 mg total) by mouth every evening.   B-D ULTRAFINE III SHORT PEN 31G X 8 MM MISC SMARTSIG:Pre-Filled Pen Syringe SUB-Q 3 Times Daily   Cholecalciferol (VITAMIN D3) 50 MCG (2000 UT) TABS Take 50 mcg by mouth daily.   ELIQUIS 5 MG TABS tablet TAKE 1 TABLET(5 MG)  BY MOUTH TWICE DAILY   fluticasone-salmeterol (ADVAIR) 250-50 MCG/ACT AEPB INHALE 1 PUFF INTO THE LUNGS IN THE MORNING AND AT BEDTIME   furosemide (LASIX) 40 MG tablet Take 1 tablet (40 mg total) by mouth daily.   levalbuterol (XOPENEX HFA) 45 MCG/ACT inhaler 1 puff as needed Inhalation every 6 hrs   Multiple Vitamin  (MULTIVITAMIN WITH MINERALS) TABS tablet Take 1 tablet by mouth daily.   MYRBETRIQ 25 MG TB24 tablet Take 25 mg by mouth daily.   NOVOLOG MIX 70/30 FLEXPEN (70-30) 100 UNIT/ML FlexPen Inject 60 Units into the skin in the morning and at bedtime.   ONETOUCH VERIO test strip 1 each by Other route in the morning, at noon, and at bedtime.    polyethylene glycol (MIRALAX / GLYCOLAX) 17 g packet Take 17 g by mouth daily.   PREVIDENT 5000 BOOSTER PLUS 1.1 % PSTE Place 1 application onto teeth in the morning and at bedtime.    sertraline (ZOLOFT) 100 MG tablet Take 100 mg by mouth daily.   No current facility-administered medications on file prior to visit.     Allergies:   Patient has no known allergies.   Social History   Tobacco Use   Smoking status: Never   Smokeless tobacco: Never  Vaping Use   Vaping Use: Never used  Substance Use Topics   Alcohol use: Not Currently    Comment: Used to drink 2 L bottle of wine/day. Quit 09/18/19   Drug use: Never    Family History: family history includes Colon cancer in his father; Heart disease in his paternal grandfather.  ROS:   Please see the history of present illness.   (+) Shortness of breath (+) LE edema Additional pertinent ROS negative except as noted in HPI   EKGs/Labs/Other Studies Reviewed:    The following studies were reviewed today: Carotid US 05/23/21 Right Carotid: Velocities in the right ICA are consistent with a 1-39%  stenosis.   Left Carotid: Velocities in the left ICA are consistent with a 1-39%  stenosis.   Vertebrals:  Bilateral vertebral arteries demonstrate antegrade flow.  Subclavians: Normal flow hemodynamics were seen in bilateral subclavian               arteries.  Ablation 03/17/20 CONCLUSIONS: 1. Atrial fibrillation upon presentation.   2. Successful electrical isolation and anatomical encircling of all four pulmonary veins with radiofrequency current.  A WACA approach was used 3. Additional left  atrial ablation was performed with a left atrial roofline 4. Atrial fibrillation successfully cardioverted to sinus rhythm. 5. No early apparent complications.  CT Cardiac Morph 03/10/20 1. The left atrial appendage has chicken wing morphology with 1 major lobe, ostial size 27 x 17 mm and length 39 mm. There is no thrombus in the left atrial appendage.   2. The esophagus runs to the left from the left atrial midline and is in the proximity to the LLPV.   3. The esophagus runs in the left atrial midline and is not in the proximity to any of the pulmonary veins.   4. Calcium score is 1394 that corresponds with 86 percentile for age/sex.  Echo 12/04/19  1. Left ventricular ejection fraction, by estimation, is 55 to 60%. The  left ventricle has normal function. The left ventricle has no regional  wall motion abnormalities. The left ventricular internal cavity size was  mildly dilated. There is mild  concentric left ventricular hypertrophy. Left ventricular diastolic  parameters are consistent with Grade III diastolic  dysfunction  (restrictive). Elevated left ventricular end-diastolic pressure.   2. Right ventricular systolic function is normal. The right ventricular  size is mildly enlarged.   3. Left atrial size was mildly dilated.   4. Right atrial size was mild to moderately dilated.   5. The mitral valve is grossly normal. Trivial mitral valve  regurgitation. No evidence of mitral stenosis.   6. The aortic valve is tricuspid. Aortic valve regurgitation is mild to  moderate. Moderate aortic valve stenosis.   7. The inferior vena cava is dilated in size with >50% respiratory  variability, suggesting right atrial pressure of 8 mmHg.   Echo 09/20/19  1. Left ventricular ejection fraction, by visual estimation, is 50 to  55%. The left ventricle has normal function. There is mildly increased  left ventricular hypertrophy.   2. Definity contrast agent was given IV to delineate the left  ventricular  endocardial borders.   3. Left ventricular diastolic function could not be evaluated.   4. Mildly dilated left ventricular internal cavity size.   5. The left ventricle has no regional wall motion abnormalities.   6. Global right ventricle has normal systolic function.The right  ventricular size is mildly enlarged.   7. Left atrial size was mildly dilated.   8. Right atrial size was mildly dilated.   9. Mild mitral annular calcification.  10. Trivial mitral valve regurgitation. No evidence of mitral stenosis.  11. The tricuspid valve is normal in structure. Tricuspid valve  regurgitation is trivial.  12. The aortic valve was not well visualized. Aortic valve regurgitation  is not visualized. Mild to moderate aortic valve stenosis.  13. The pulmonic valve was not well visualized. Pulmonic valve  regurgitation is not visualized.  14. The inferior vena cava is dilated in size with <50% respiratory  variability, suggesting right atrial pressure of 15 mmHg.  15. Technically difficult; definity used; low normal LV systolic function;  mild LVH; mild LVE; calcified aortic valve with mild to moderate AS (mean  gradient 19 mmHg); mild LAE/RAE/RVE.   EKG:  EKG personally reviewed today 07/09/22: NSR at 82 bpm, IVCD, LAFB 10/23/21: not ordered 06/20/20: NSR at 81 bpm, LAFB, IVCD  Recent Labs: 07/11/2021: BUN 22; Creatinine, Ser 1.34; Hemoglobin 12.9; Platelets 236; Potassium 4.4; Sodium 139  Recent Lipid Panel    Component Value Date/Time   CHOL 96 12/04/2019 0503   TRIG 59 12/04/2019 0503   HDL 32 (L) 12/04/2019 0503   CHOLHDL 3.0 12/04/2019 0503   VLDL 12 12/04/2019 0503   LDLCALC 52 12/04/2019 0503    Physical Exam:    VS:  BP 132/62   Ht 6\' 2"  (1.88 m)   Wt (!) 322 lb 3.2 oz (146.1 kg)   SpO2 95%   BMI 41.37 kg/m     Wt Readings from Last 3 Encounters:  07/09/22 (!) 322 lb 3.2 oz (146.1 kg)  04/08/22 (!) 326 lb (147.9 kg)  01/01/22 (!) 323 lb (146.5 kg)     GEN: Well nourished, well developed in no acute distress HEENT: Normal, moist mucous membranes NECK: No JVD CARDIAC: regular rhythm, normal S1 and S2, no rubs or gallops. 2 different systolic murmurs, both 2/6. VASCULAR: Radial and DP pulses 2+ bilaterally. No carotid bruits RESPIRATORY:  Clear to auscultation without rales, wheezing or rhonchi  ABDOMEN: Soft, non-tender, non-distended MUSCULOSKELETAL:  Ambulates independently SKIN: Warm and dry, no edema NEUROLOGIC:  Alert and oriented x 3. No focal neuro deficits noted. PSYCHIATRIC:  Normal affect  ASSESSMENT:    1. Chest pain of uncertain etiology   2. Dyspnea on exertion   3. Paroxysmal atrial fibrillation (HCC)   4. History of CVA (cerebrovascular accident)   5. Secondary hypercoagulable state ([place])   6. Nonrheumatic aortic valve stenosis     PLAN:    Chest tightness on exertion Dyspnea on exertion -discussed treadmill stress, nuclear stress/lexiscan, and CT coronary angiography. Discussed pros and cons of each, including but not limited to false positive/false negative risk, radiation risk, and risk of IV contrast dye. Based on shared decision making, decision was made to nuclear stress test. Cardiac PET would be ideal but the wait list for this is several months out at that time. -we discussed treadmill vs. Lexiscan. He used to be very active, has been too nervous to push himself. After shared decision making, will start with treadmill nuclear, can convert to [place] if needed. Will need to be a 2 day test.  Shared Decision Making/Informed Consent The risks [chest pain, shortness of breath, cardiac arrhythmias, dizziness, blood pressure fluctuations, myocardial infarction, stroke/transient ischemic attack, nausea, vomiting, allergic reaction, radiation exposure, metallic taste sensation and life-threatening complications (estimated to be 1 in 10,000)], benefits (risk stratification, diagnosing coronary artery disease,  treatment guidance) and alternatives of a nuclear stress test were discussed in detail with Mr. Delorbe and he agrees to proceed.   Murmurs Moderate aortic stenosis -he is due for repeat echo in three months. However, will move this up given symptoms. Also need to make sure that the aortic stenosis has not progressed to severe, as I would not treadmill stress him if his AS is now severe -scheduled echo for 12/13 with stress 12/18, time to review results  Paroxysmal atrial fibrillation with RVR:  -In NSR today. -on apixaban for anticoagulation -s/p ablation by Dr. Curt Bears.  -instructed on red flag warning signs that need immediate medical attention  History of CVA: -on aspirin, apixaban, and atorvastatin  Type II diabetes: -with history of CVA, as well as BMI >40, would recommend GLP1RA. We discussed this. Defer to his endocrinologist Dr. Chalmers Cater.  Possible OSA: sleep study didn't meet criteria for CPAP. Recommended to avoid laying supine.  CV risk counseling and prevention: -recommend heart healthy/Mediterranean diet, with whole grains, fruits, vegetable, fish, lean meats, nuts, and olive oil. Limit salt. -recommend moderate walking, 3-5 times/week for 30-50 minutes each session. Aim for at least 150 minutes.week. Goal should be pace of 3 miles/hours, or walking 1.5 miles in 30 minutes -recommend avoidance of tobacco products. Avoid excess alcohol.  Plan for follow up: if testing unremarkable, follow up in 6 mos, otherwise will bring back sooner to discuss next steps  Total time of encounter: 47 minutes total time of encounter, including 42 minutes spent in face-to-face patient care. This time includes coordination of care and counseling regarding above conditions. Remainder of non-face-to-face time involved reviewing chart documents/testing relevant to the patient encounter and documentation in the medical record.  Buford Dresser, MD, PhD, [facility]     Medication Adjustments/Labs and Tests Ordered: Current medicines are reviewed at length with the patient today.  Concerns regarding medicines are outlined above.  Orders Placed This Encounter  Procedures   MYOCARDIAL PERFUSION IMAGING   EKG 12-Lead   ECHOCARDIOGRAM COMPLETE   No orders of the defined types were placed in this encounter.   Patient Instructions  Medication Instructions:  The current medical regimen is effective;  continue present plan and medications.  *If you need  a refill on your cardiac medications before your next appointment, please call your pharmacy*  Testing/Procedures:  Echocardiogram - Your physician has requested that you have an echocardiogram. Echocardiography is a painless test that uses sound waves to create images of your heart. It provides your doctor with information about the size and shape of your heart and how well your heart's chambers and valves are working. This procedure takes approximately one hour. There are no restrictions for this procedure.   Your physician has requested that you have an Exercise Myoview. A cardiac stress test is a cardiological test that measures the heart's ability to respond to external stress in a controlled clinical environment. The stress response is induced by exercise (exercise-treadmill). For further information please visit [email or web address]. If you have questions or concerns about your appointment, you can call the Nuclear Lab at [phone].   Follow-Up: At [facility], you and your health needs are our priority.  As part of our continuing mission to provide you with exceptional heart care, we have created designated Provider Care Teams.  These Care Teams include your primary Cardiologist (physician) and Advanced Practice Providers (APPs -  Physician Assistants and Nurse Practitioners) who all work together to provide you with the care you need, when you need it.  We recommend signing up for the  patient portal called "MyChart".  Sign up information is provided on this After Visit Summary.  MyChart is used to connect with patients for Virtual Visits (Telemedicine).  Patients are able to view lab/test results, encounter notes, upcoming appointments, etc.  Non-urgent messages can be sent to your provider as well.   To learn more about what you can do with MyChart, go to [email or web address].    Your next appointment:   6 month(s)  The format for your next appointment:   In Person  Provider:   Buford Dresser, MD          Signed, Buford Dresser, MD PhD 07/10/2022   [place]

## 2022-07-09 NOTE — Patient Instructions (Signed)
Medication Instructions:  The current medical regimen is effective;  continue present plan and medications.  *If you need a refill on your cardiac medications before your next appointment, please call your pharmacy*  Testing/Procedures:  Echocardiogram - Your physician has requested that you have an echocardiogram. Echocardiography is a painless test that uses sound waves to create images of your heart. It provides your doctor with information about the size and shape of your heart and how well your heart's chambers and valves are working. This procedure takes approximately one hour. There are no restrictions for this procedure.   Your physician has requested that you have an Exercise Myoview. A cardiac stress test is a cardiological test that measures the heart's ability to respond to external stress in a controlled clinical environment. The stress response is induced by exercise (exercise-treadmill). For further information please visit [email or web address] If you have questions or concerns about your appointment, you can call the Nuclear Lab at [phone].   Follow-Up: At [facility], you and your health needs are our priority.  As part of our continuing mission to provide you with exceptional heart care, we have created designated Provider Care Teams.  These Care Teams include your primary Cardiologist (physician) and Advanced Practice Providers (APPs -  Physician Assistants and Nurse Practitioners) who all work together to provide you with the care you need, when you need it.  We recommend signing up for the patient portal called "MyChart".  Sign up information is provided on this After Visit Summary.  MyChart is used to connect with patients for Virtual Visits (Telemedicine).  Patients are able to view lab/test results, encounter notes, upcoming appointments, etc.  Non-urgent messages can be sent to your provider as well.   To learn more about what you can do with MyChart, go to  [email or web address].    Your next appointment:   6 month(s)  The format for your next appointment:   In Person  Provider:   Jodelle Red, MD

## 2022-07-10 ENCOUNTER — Encounter (HOSPITAL_BASED_OUTPATIENT_CLINIC_OR_DEPARTMENT_OTHER): Payer: Self-pay | Admitting: Cardiology

## 2022-07-14 ENCOUNTER — Encounter (HOSPITAL_BASED_OUTPATIENT_CLINIC_OR_DEPARTMENT_OTHER): Payer: Self-pay

## 2022-07-14 DIAGNOSIS — R079 Chest pain, unspecified: Secondary | ICD-10-CM

## 2022-07-15 DIAGNOSIS — R079 Chest pain, unspecified: Secondary | ICD-10-CM | POA: Diagnosis not present

## 2022-07-15 NOTE — Telephone Encounter (Signed)
Patient needs lexi rescheduled due to change in the order

## 2022-07-15 NOTE — Telephone Encounter (Signed)
Okay to switch to [employer].  Alver Sorrow, NP

## 2022-07-16 LAB — BASIC METABOLIC PANEL
BUN/Creatinine Ratio: 16 (ref 10–24)
BUN: 22 mg/dL (ref 8–27)
CO2: 23 mmol/L (ref 20–29)
Calcium: 10.2 mg/dL (ref 8.6–10.2)
Chloride: 106 mmol/L (ref 96–106)
Creatinine, Ser: 1.41 mg/dL — ABNORMAL HIGH (ref 0.76–1.27)
Glucose: 142 mg/dL — ABNORMAL HIGH (ref 70–99)
Potassium: 4.1 mmol/L (ref 3.5–5.2)
Sodium: 147 mmol/L — ABNORMAL HIGH (ref 134–144)
eGFR: 52 mL/min/{1.73_m2} — ABNORMAL LOW (ref >59)

## 2022-07-17 ENCOUNTER — Encounter (HOSPITAL_BASED_OUTPATIENT_CLINIC_OR_DEPARTMENT_OTHER): Payer: Self-pay

## 2022-07-29 ENCOUNTER — Other Ambulatory Visit: Payer: Self-pay

## 2022-07-29 ENCOUNTER — Encounter (INDEPENDENT_AMBULATORY_CARE_PROVIDER_SITE_OTHER): Payer: Medicare Other

## 2022-07-29 ENCOUNTER — Emergency Department (HOSPITAL_COMMUNITY)
Admission: EM | Admit: 2022-07-29 | Discharge: 2022-07-30 | Disposition: A | Discharge status: Home / Self Care | Payer: Medicare Other | Attending: Emergency Medicine | Admitting: Emergency Medicine

## 2022-07-29 ENCOUNTER — Emergency Department (HOSPITAL_COMMUNITY): Payer: Medicare Other

## 2022-07-29 ENCOUNTER — Encounter (HOSPITAL_BASED_OUTPATIENT_CLINIC_OR_DEPARTMENT_OTHER): Payer: Self-pay

## 2022-07-29 DIAGNOSIS — J45909 Unspecified asthma, uncomplicated: Secondary | ICD-10-CM | POA: Diagnosis not present

## 2022-07-29 DIAGNOSIS — I10 Essential (primary) hypertension: Secondary | ICD-10-CM | POA: Insufficient documentation

## 2022-07-29 DIAGNOSIS — I700 Atherosclerosis of aorta: Secondary | ICD-10-CM | POA: Diagnosis not present

## 2022-07-29 DIAGNOSIS — I499 Cardiac arrhythmia, unspecified: Secondary | ICD-10-CM | POA: Diagnosis present

## 2022-07-29 DIAGNOSIS — R000 Tachycardia, unspecified: Secondary | ICD-10-CM

## 2022-07-29 DIAGNOSIS — J9811 Atelectasis: Secondary | ICD-10-CM | POA: Diagnosis not present

## 2022-07-29 DIAGNOSIS — E119 Type 2 diabetes mellitus without complications: Secondary | ICD-10-CM | POA: Diagnosis not present

## 2022-07-29 DIAGNOSIS — R002 Palpitations: Secondary | ICD-10-CM | POA: Diagnosis not present

## 2022-07-29 LAB — CBC
HCT: 38.4 % — ABNORMAL LOW (ref 39.0–52.0)
Hemoglobin: 13 g/dL (ref 13.0–17.0)
MCH: 30.1 pg (ref 26.0–34.0)
MCHC: 33.9 g/dL (ref 30.0–36.0)
MCV: 88.9 fL (ref 80.0–100.0)
Platelets: 211 10*3/uL (ref 150–400)
RBC: 4.32 MIL/uL (ref 4.22–5.81)
RDW: 13.4 % (ref 11.5–15.5)
WBC: 11.4 10*3/uL — ABNORMAL HIGH (ref 4.0–10.5)
nRBC: 0 % (ref 0.0–0.2)

## 2022-07-29 MED ORDER — DILTIAZEM HCL 25 MG/5ML IV SOLN
10.0000 mg | Freq: Once | INTRAVENOUS | Status: AC
  Administered 2022-07-29: 10 mg via INTRAVENOUS
  Filled 2022-07-29: qty 5

## 2022-07-29 MED ORDER — SODIUM CHLORIDE 0.9 % IV BOLUS
500.0000 mL | Freq: Once | INTRAVENOUS | Status: AC
  Administered 2022-07-29: 500 mL via INTRAVENOUS

## 2022-07-29 NOTE — ED Triage Notes (Signed)
Pt arrives to ED c/o fluctuating HR. Pt with hx of a-fib, rates per EMS 80-120's. Pt's rates normally controlled. CBG 139

## 2022-07-29 NOTE — Telephone Encounter (Signed)
Pt. Responding to you!  

## 2022-07-29 NOTE — Telephone Encounter (Signed)
Duplicate encounter. See separate MyChart 07/29/22.   Alver Sorrow, NP

## 2022-07-29 NOTE — ED Provider Notes (Signed)
[facility] DEPT [facility] Emergency Department Provider Note MRN:  [id number]  Arrival date & time: 07/30/22     Chief Complaint   Irregular Heart Beat   History of Present Illness   Brandon Sanchez is a 76 y.o. year-old male with a history of asthma, A-fib, diabetes presenting to the ED with chief complaint of irregular heartbeat.  Irregular heartbeat all day today, woke up and his heart rate was faster than normal.  He says his baseline is between 70 and 90 but this morning it was between 100 and 120.  Some chest pressure today with exertion which is not uncommon for him.  Some shortness of breath with exertion which he attributes to his asthma, again not uncommon.  Denies any fever or cough or cold-like symptoms, no abdominal pain, no leg pain or swelling.  This evening the heart rate seem to be changing very frequently and so he is here for evaluation.  He needed cardioversion and ablation 3 years ago for A-fib but has not had problems since.  Review of Systems  A thorough review of systems was obtained and all systems are negative except as noted in the HPI and PMH.   Patient's Health History    Past Medical History:  Diagnosis Date   Allergic rhinitis, cause unspecified    Asthma    Cataract cortical, senile, bilateral    Macular degeneration of left eye    Other and unspecified hyperlipidemia    Type II or unspecified type diabetes mellitus without mention of complication, not stated as uncontrolled    Unspecified essential hypertension     Past Surgical History:  Procedure Laterality Date   ATRIAL FIBRILLATION ABLATION N/A 03/17/2020   Procedure: ATRIAL FIBRILLATION ABLATION;  Surgeon: Regan Lemming, MD;  [facility] INVAS[facility] CV LAB;  Service: Cardiovascular;  Laterality: N/A;   CARDIOVERSION N/A 09/22/2019   Procedure: CARDIOVERSION;  Surgeon: [name], MD;  Location: [facility] ENDOSCOPY;  Service: Endoscopy;  Laterality: N/A;   CARDIOVERSION N/A  10/18/2019   Procedure: CARDIOVERSION;  Surgeon: Jake Bathe, MD;  Location: [facility] ENDOSCOPY;  Service: Cardiovascular;  Laterality: N/A;   CATARACT EXTRACTION, BILATERAL  08/2020   TEE WITHOUT CARDIOVERSION N/A 09/22/2019   Procedure: TRANSESOPHAGEAL ECHOCARDIOGRAM (TEE);  Surgeon: [name], MD;  Location: [facility] ENDOSCOPY;  Service: Endoscopy;  Laterality: N/A;    Family History  Problem Relation Age of Onset   Colon cancer Father    Heart disease Paternal Grandfather     Social History   Socioeconomic History   Marital status: Divorced    Spouse name: Not on file   Number of children: Not on file   Years of education: Not on file   Highest education level: Professional school degree (e.g., MD, DDS, DVM, JD)  Occupational History   Occupation: Defense attorney  Tobacco Use   Smoking status: Never   Smokeless tobacco: Never  Vaping Use   Vaping Use: Never used  Substance and Sexual Activity   Alcohol use: Not Currently    Comment: Used to drink 2 L bottle of wine/day. Quit 09/18/19   Drug use: Never   Sexual activity: Not Currently  Other Topics Concern   Not on file  Social History Narrative   Not on file   Social Determinants of Health   Financial Resource Strain: Not on file  Food Insecurity: No Food Insecurity (09/27/2019)   Hunger Vital Sign    Worried About Running Out of Food in the Last  Year: Never true    Ran Out of Food in the Last Year: Never true  Transportation Needs: No Transportation Needs (09/27/2019)   PRAPARE - Administrator, Civil Service (Medical): No    Lack of Transportation (Non-Medical): No  Physical Activity: Not on file  Stress: Not on file  Social Connections: Not on file  Intimate Partner Violence: Not on file     Physical Exam   Vitals:   07/29/22 2345 07/30/22 0030  BP: 111/67 113/66  Pulse: (!) 114   Resp: 12 18  Temp:    SpO2: 95%     CONSTITUTIONAL: Well-appearing, NAD NEURO/PSYCH:  Alert and oriented x  3, no focal deficits EYES:  eyes equal and reactive ENT/NECK:  no LAD, no JVD CARDIO: Tachycardic rate, well-perfused, normal S1 and S2 PULM:  CTAB no wheezing or rhonchi GI/GU:  non-distended, non-tender MSK/SPINE:  No gross deformities, no edema SKIN:  no rash, atraumatic   *Additional and/or pertinent findings included in MDM below  Diagnostic and Interventional Summary    EKG Interpretation  Date/Time:  Monday July 29 2022 23:10:52 EST Ventricular Rate:  114 PR Interval:  34 QRS Duration: 112 QT Interval:  348 QTC Calculation: 480 R Axis:   -69 Text Interpretation: Sinus tachycardia vs afib Abnormal R-wave progression, early transition LVH with secondary repolarization abnormality Inferior infarct, old Confirmed by Kennis Carina [phone]) on 07/29/2022 11:18:14 PM       Labs Reviewed  CBC - Abnormal; Notable for the following components:      Result Value   WBC 11.4 (*)    HCT 38.4 (*)    All other components within normal limits  COMPREHENSIVE METABOLIC PANEL - Abnormal; Notable for the following components:   Glucose, Bld 130 (*)    BUN 30 (*)    Creatinine, Ser 1.42 (*)    Albumin 3.4 (*)    GFR, Estimated 52 (*)    All other components within normal limits  MAGNESIUM  TROPONIN I (HIGH SENSITIVITY)    DG Chest Port 1 View  Final Result      Medications  sodium chloride 0.9 % bolus 500 mL (500 mLs Intravenous New Bag/Given 07/29/22 2328)  diltiazem (CARDIZEM) injection 10 mg (10 mg Intravenous Given 07/29/22 2338)     Procedures  /  Critical Care Procedures  ED Course and Medical Decision Making  Initial Impression and Ddx Tachycardia, suspicious for recurrence of A-fib.  EKG is a bit unclear, looks like A-fib, will provide some fluids, small dose diltiazem, reassess.  Other considerations include electrolyte disturbance, ACS given the chest pain.  Past medical/surgical history that increases complexity of ED encounter: A-fib  Interpretation of  Diagnostics I personally reviewed the EKG and my interpretation is as follows: Sinus tachycardia  Labs reassuring with no significant blood count or electrolyte disturbance  Patient Reassessment and Ultimate Disposition/Management Clinical Course as of 07/30/22 0050  Tue Jul 30, 2022  0007 Repeat EKG seems more convincing of sinus tachycardia.  P waves noted in V1 [MB]    Clinical Course User Index [MB] Sabas Sous, MD     Patient continues to feel well, no symptoms, really had no new symptoms all day.  Was noting some elevated heart rates on his pulse oximeter at home.  He explains that his cardiologist has been debating starting him on metoprolol, he has a cardiology appointment tomorrow.  And so given this good follow-up plan, and lack of signs of emergent process, patient  is appropriate for discharge.  Patient management required discussion with the following services or consulting groups:  None  Complexity of Problems Addressed Acute illness or injury that poses threat of life of bodily function  Additional Data Reviewed and Analyzed Further history obtained from: Recent Consult notes  Additional Factors Impacting ED Encounter Risk Consideration of hospitalization  Elmer Sow. Pilar Plate, MD [facility] Emergency [facility] [email or web address]  Final Clinical Impressions(s) / ED Diagnoses     ICD-10-CM   1. Palpitations  R00.2     2. Tachycardia  R00.0       ED Discharge Orders     None        Discharge Instructions Discussed with and Provided to Patient:     Discharge Instructions      You were evaluated in the Emergency Department and after careful evaluation, we did not find any emergent condition requiring admission or further testing in the hospital.  Your exam/testing today is overall reassuring.  Recommend close follow-up with your cardiologist to recheck your heart rate and discuss any needed medication  adjustments.  Please return to the Emergency Department if you experience any worsening of your condition.   Thank you for allowing [place] to be a part of your care.       Sabas Sous, MD 07/30/22 [phone]

## 2022-07-30 DIAGNOSIS — R002 Palpitations: Secondary | ICD-10-CM | POA: Diagnosis not present

## 2022-07-30 LAB — COMPREHENSIVE METABOLIC PANEL
ALT: 19 U/L (ref 0–44)
AST: 28 U/L (ref 15–41)
Albumin: 3.4 g/dL — ABNORMAL LOW (ref 3.5–5.0)
Alkaline Phosphatase: 92 U/L (ref 38–126)
Anion gap: 11 (ref 5–15)
BUN: 30 mg/dL — ABNORMAL HIGH (ref 8–23)
CO2: 22 mmol/L (ref 22–32)
Calcium: 9.1 mg/dL (ref 8.9–10.3)
Chloride: 106 mmol/L (ref 98–111)
Creatinine, Ser: 1.42 mg/dL — ABNORMAL HIGH (ref 0.61–1.24)
GFR, Estimated: 52 mL/min — ABNORMAL LOW (ref >60)
Glucose, Bld: 130 mg/dL — ABNORMAL HIGH (ref 70–99)
Potassium: 3.7 mmol/L (ref 3.5–5.1)
Sodium: 139 mmol/L (ref 135–145)
Total Bilirubin: 0.4 mg/dL (ref 0.3–1.2)
Total Protein: 6.7 g/dL (ref 6.5–8.1)

## 2022-07-30 LAB — MAGNESIUM: Magnesium: 1.9 mg/dL (ref 1.7–2.4)

## 2022-07-30 LAB — TROPONIN I (HIGH SENSITIVITY): Troponin I (High Sensitivity): 16 ng/L (ref <18)

## 2022-07-30 MED ORDER — METOPROLOL SUCCINATE ER 25 MG PO TB24: 25.0000 mg | ORAL_TABLET | Freq: Every day | ORAL | 2 refills | Status: DC

## 2022-07-30 NOTE — ED Notes (Signed)
Pt able to understand discharge teaching. Pt discharged to lobby awaiting cab

## 2022-07-30 NOTE — Discharge Instructions (Signed)
You were evaluated in the Emergency Department and after careful evaluation, we did not find any emergent condition requiring admission or further testing in the hospital.  Your exam/testing today is overall reassuring.  Recommend close follow-up with your cardiologist to recheck your heart rate and discuss any needed medication adjustments.  Please return to the Emergency Department if you experience any worsening of your condition.   Thank you for allowing [place] to be a part of your care.

## 2022-07-31 ENCOUNTER — Telehealth (HOSPITAL_BASED_OUTPATIENT_CLINIC_OR_DEPARTMENT_OTHER): Payer: Self-pay | Admitting: *Deleted

## 2022-07-31 ENCOUNTER — Ambulatory Visit (INDEPENDENT_AMBULATORY_CARE_PROVIDER_SITE_OTHER): Payer: Medicare Other

## 2022-07-31 DIAGNOSIS — I350 Nonrheumatic aortic (valve) stenosis: Secondary | ICD-10-CM

## 2022-07-31 LAB — ECHOCARDIOGRAM COMPLETE
AR max vel: 0.58 cm2
AV Area VTI: 0.59 cm2
AV Area mean vel: 0.48 cm2
AV Mean grad: 51 mmHg
AV Peak grad: 77.1 mmHg
Ao pk vel: 4.39 m/s
Area-P 1/2: 3.12 cm2
S' Lateral: 2.89 cm

## 2022-07-31 MED ORDER — PERFLUTREN LIPID MICROSPHERE
1.0000 mL | INTRAVENOUS | Status: AC | PRN
  Administered 2022-07-31: 1 mL via INTRAVENOUS

## 2022-07-31 NOTE — Addendum Note (Signed)
Addended by: Marlene Lard on: 07/31/2022 09:52 AM   Modules accepted: Orders

## 2022-07-31 NOTE — Telephone Encounter (Signed)
EKG performed today while patient here for Echocardiogram. EKG reviewed by Gillian Shields, NP with the following instructions.   Have patient start metoprolol as previously recommended and fu in 2-3 weeks with either Dr. Cristal Deer or Gillian Shields, NP.   Patient scheduled for 12/27 with Gillian Shields, NP.

## 2022-07-31 NOTE — Telephone Encounter (Signed)
Can go go ahead with the EKG today while he is here for echo, please? Just to ensure still sinus tach and not atrial fib. TY!  Alver Sorrow, NP

## 2022-07-31 NOTE — Telephone Encounter (Signed)
Advised patient, referral placed Per Dr Cristal Deer ok to cancel nuclear stress test next week, patient aware

## 2022-07-31 NOTE — Telephone Encounter (Signed)
-----   Message from Jodelle Red, MD sent at 07/31/2022  5:46 PM EST ----- Echo shows that aortic valve narrowing is now severe. This has progressed since the last ultrasound. I would like to refer you to our structural heart team to see if you are a good fit for a minimally invasive valve procedure called TAVR.

## 2022-08-01 ENCOUNTER — Encounter (HOSPITAL_BASED_OUTPATIENT_CLINIC_OR_DEPARTMENT_OTHER): Payer: Self-pay

## 2022-08-05 ENCOUNTER — Ambulatory Visit (HOSPITAL_COMMUNITY): Payer: Medicare Other

## 2022-08-06 ENCOUNTER — Ambulatory Visit (HOSPITAL_COMMUNITY): Payer: Medicare Other

## 2022-08-09 ENCOUNTER — Encounter (HOSPITAL_BASED_OUTPATIENT_CLINIC_OR_DEPARTMENT_OTHER): Payer: Self-pay

## 2022-08-09 ENCOUNTER — Ambulatory Visit: Payer: Medicare Other | Attending: Cardiovascular Disease | Admitting: Cardiovascular Disease

## 2022-08-09 ENCOUNTER — Encounter: Payer: Self-pay | Admitting: Cardiovascular Disease

## 2022-08-09 VITALS — BP 118/60 | HR 122 | Ht 74.0 in | Wt 319.6 lb

## 2022-08-09 DIAGNOSIS — R000 Tachycardia, unspecified: Secondary | ICD-10-CM

## 2022-08-09 DIAGNOSIS — I350 Nonrheumatic aortic (valve) stenosis: Secondary | ICD-10-CM | POA: Diagnosis not present

## 2022-08-09 DIAGNOSIS — Z01812 Encounter for preprocedural laboratory examination: Secondary | ICD-10-CM | POA: Insufficient documentation

## 2022-08-09 MED ORDER — METOPROLOL SUCCINATE ER 50 MG PO TB24: 50.0000 mg | ORAL_TABLET | Freq: Every day | ORAL | 3 refills | Status: DC

## 2022-08-09 NOTE — Progress Notes (Signed)
 Structural Heart Clinic Consult Note  Chief Complaint  Patient presents with   New Patient (Initial Visit)    Severe aortic stenosis    History of Present Illness: 76 yo male with history of chronic diastolic CHF, asthma, DM, HTN, hyperlipidemia, paroxysmal atrial fibrillation, prior CVA and severe aortic stenosis who is here today as a new consult, referred by Dr. Zekiah Caruth, for further discussion regarding his aortic stenosis and possible TAVR. He has atrial fibrillation and has undergone atrial fibrillation ablation in July 2021. He has been on Eliquis. He had a stroke in April of 2021. Echo 07/31/22 with LVEF=50-55%. Mild LVH. Severe aortic stenosis with mean gradient 51 mmHg, peak gradient 77 mmHg, AVA 0.48 cm2, DI 0.19, SVI 19.   He tells me today that he has progressive dyspnea with exertion, chest pain with exertion, LE edema (for years) and fatigue. No dizziness or near syncope. He lives alone in [place] , [place]. He is a retired defense attorney. He has one daughter who lives in [place], [place]. She is on the phone today during our visit. He has no active dental issues. He sees a dentist regularly.   Primary Care Physician: Pharr, Walter, MD Primary Cardiologist: Margi Edmundson Referring Cardiologist: Renly Guedes  Past Medical History:  Diagnosis Date   Allergic rhinitis, cause unspecified    Asthma    Atrial fibrillation (HCC)    Cataract cortical, senile, bilateral    Macular degeneration of left eye    Other and unspecified hyperlipidemia    Type II or unspecified type diabetes mellitus without mention of complication, not stated as uncontrolled    Unspecified essential hypertension     Past Surgical History:  Procedure Laterality Date   ATRIAL FIBRILLATION ABLATION N/A 03/17/2020   Procedure: ATRIAL FIBRILLATION ABLATION;  Surgeon: Camnitz, Will Martin, MD;  [facility] INVAS[facility] CV LAB;  Service: Cardiovascular;  Laterality: N/A;   CARDIOVERSION N/A 09/22/2019    Procedure: CARDIOVERSION;  Surgeon: Schumann, Lucindia Lemley L, MD;  Location: [facility] ENDOSCOPY;  Service: Endoscopy;  Laterality: N/A;   CARDIOVERSION N/A 10/18/2019   Procedure: CARDIOVERSION;  Surgeon: Skains, Mark C, MD;  Location: [facility] ENDOSCOPY;  Service: Cardiovascular;  Laterality: N/A;   CATARACT EXTRACTION, BILATERAL  08/2020   TEE WITHOUT CARDIOVERSION N/A 09/22/2019   Procedure: TRANSESOPHAGEAL ECHOCARDIOGRAM (TEE);  Surgeon: Schumann, Malek Skog L, MD;  Location: [facility] ENDOSCOPY;  Service: Endoscopy;  Laterality: N/A;   TONSILLECTOMY      Current Outpatient Medications  Medication Sig Dispense Refill   aspirin EC 81 MG EC tablet Take 1 tablet (81 mg total) by mouth daily. 60 tablet 0   atorvastatin (LIPITOR) 80 MG tablet Take 1 tablet (80 mg total) by mouth every evening. 30 tablet 0   B-D ULTRAFINE III SHORT PEN 31G X 8 MM MISC SMARTSIG:Pre-Filled Pen Syringe SUB-Q 3 Times Daily     Cholecalciferol (VITAMIN D3) 50 MCG (2000 UT) TABS Take 50 mcg by mouth daily.     ELIQUIS 5 MG TABS tablet TAKE 1 TABLET(5 MG) BY MOUTH TWICE DAILY 180 tablet 2   fluticasone-salmeterol (ADVAIR) 250-50 MCG/ACT AEPB INHALE 1 PUFF INTO THE LUNGS IN THE MORNING AND AT BEDTIME 60 each 11   furosemide (LASIX) 40 MG tablet Take 1 tablet (40 mg total) by mouth daily. 90 tablet 3   levalbuterol (XOPENEX HFA) 45 MCG/ACT inhaler 1 puff as needed Inhalation every 6 hrs     metoprolol succinate (TOPROL-XL) 50 MG 24 hr tablet Take 1 tablet (50 mg total) by mouth daily. Take   with or immediately following a meal. 90 tablet 3   Multiple Vitamin (MULTIVITAMIN WITH MINERALS) TABS tablet Take 1 tablet by mouth daily.     MYRBETRIQ 25 MG TB24 tablet Take 25 mg by mouth daily.     NOVOLOG MIX 70/30 FLEXPEN (70-30) 100 UNIT/ML FlexPen Inject 60 Units into the skin in the morning and at bedtime.     ONETOUCH VERIO test strip 1 each by Other route in the morning, at noon, and at bedtime.      polyethylene glycol (MIRALAX / GLYCOLAX)  17 g packet Take 17 g by mouth daily. 14 each 0   PREVIDENT 5000 BOOSTER PLUS 1.1 % PSTE Place 1 application onto teeth in the morning and at bedtime.      sertraline (ZOLOFT) 100 MG tablet Take 100 mg by mouth daily.     No current facility-administered medications for this visit.    No Known Allergies  Social History   Socioeconomic History   Marital status: Divorced    Spouse name: Not on file   Number of children: 1   Years of education: Not on file   Highest education level: Professional school degree (e.g., MD, DDS, DVM, JD)  Occupational History   Occupation: Defense attorney  Tobacco Use   Smoking status: Never   Smokeless tobacco: Never  Vaping Use   Vaping Use: Never used  Substance and Sexual Activity   Alcohol use: Not Currently    Comment: Used to drink 2 L bottle of wine/day. Quit 09/18/19   Drug use: Never   Sexual activity: Not Currently  Other Topics Concern   Not on file  Social History Narrative   Not on file   Social Determinants of Health   Financial Resource Strain: Not on file  Food Insecurity: No Food Insecurity (09/27/2019)   Hunger Vital Sign    Worried About Running Out of Food in the Last Year: Never true    Ran Out of Food in the Last Year: Never true  Transportation Needs: No Transportation Needs (09/27/2019)   PRAPARE - Administrator, Civil Service (Medical): No    Lack of Transportation (Non-Medical): No  Physical Activity: Not on file  Stress: Not on file  Social Connections: Not on file  Intimate Partner Violence: Not on file    Family History  Problem Relation Age of Onset   Colon cancer Father    Heart disease Paternal Grandfather     Review of Systems:  As stated in the HPI and otherwise negative.   BP 118/60   Pulse (!) 122   Ht 6\' 2"  (1.88 m)   Wt (!) 319 lb 9.6 oz (145 kg)   SpO2 98%   BMI 41.03 kg/m   Physical Examination: General: Well developed, well nourished, NAD  HEENT: OP clear, mucus membranes  moist  SKIN: warm, dry. No rashes. Neuro: No focal deficits  Musculoskeletal: Muscle strength 5/5 all ext  Psychiatric: Mood and affect normal  Neck: No JVD, no carotid bruits, no thyromegaly, no lymphadenopathy.  Lungs:Clear bilaterally, no wheezes, rhonci, crackles Cardiovascular: Regular rate and rhythm. Loud, harsh, late peaking systolic murmur.  Abdomen:Soft. Bowel sounds present. Non-tender.  Extremities: Trace bilateral lower extremity edema. Pulses are 2 + in the bilateral DP/PT.  EKG:  EKG is not ordered today. The ekg ordered today demonstrates  EKG from 07/31/22 with sinus tachycardia  Echo 07/31/22:  1. Left ventricular ejection fraction, by estimation, is 50 to 55%. The  left  ventricle has low normal function. The left ventricle has no regional  wall motion abnormalities. There is mild left ventricular hypertrophy.  Left ventricular diastolic  parameters are consistent with Grade III diastolic dysfunction  (restrictive).   2. Right ventricular systolic function is normal. The right ventricular  size is normal. Tricuspid regurgitation signal is inadequate for assessing  PA pressure.   3. The mitral valve is degenerative. No evidence of mitral valve  regurgitation. Moderate mitral annular calcification.   4. The aortic valve is calcified. Aortic valve regurgitation is mild.  Severe aortic valve stenosis.   5. There is borderline dilatation of the ascending aorta, measuring 39  mm.   6. The inferior vena cava is normal in size with greater than 50%  respiratory variability, suggesting right atrial pressure of 3 mmHg.   Conclusion(s)/Recommendation(s): Compared to prior TTE, AS gradient has  increased, it is now severe.   FINDINGS   Left Ventricle: Left ventricular ejection fraction, by estimation, is 50  to 55%. The left ventricle has low normal function. The left ventricle has  no regional wall motion abnormalities. Definity contrast agent was given  IV to  delineate the left  ventricular endocardial borders. The left ventricular internal cavity size  was normal in size. There is mild left ventricular hypertrophy. Left  ventricular diastolic parameters are consistent with Grade III diastolic  dysfunction (restrictive).   Right Ventricle: The right ventricular size is normal. Right ventricular  systolic function is normal. Tricuspid regurgitation signal is inadequate  for assessing PA pressure.   Left Atrium: Left atrial size was normal in size.   Right Atrium: Right atrial size was normal in size.   Pericardium: There is no evidence of pericardial effusion.   Mitral Valve: The mitral valve is degenerative in appearance. There is  moderate thickening of the mitral valve leaflet(s). Moderate mitral  annular calcification. No evidence of mitral valve regurgitation. The mean  mitral valve gradient is 11.0 mmHg.   Tricuspid Valve: Tricuspid valve regurgitation is not demonstrated.   Aortic Valve: The aortic valve is calcified. Aortic valve regurgitation is  mild. Severe aortic stenosis is present. Aortic valve mean gradient  measures 51.0 mmHg. Aortic valve peak gradient measures 77.1 mmHg. Aortic  valve area, by VTI measures 0.59 cm.   Pulmonic Valve: Pulmonic valve regurgitation is not visualized.   Aorta: There is borderline dilatation of the ascending aorta, measuring 39  mm.   Venous: The inferior vena cava is normal in size with greater than 50%  respiratory variability, suggesting right atrial pressure of 3 mmHg.   IAS/Shunts: No atrial level shunt detected by color flow Doppler.     LEFT VENTRICLE  PLAX 2D  LVIDd:         3.90 cm   Diastology  LVIDs:         2.89 cm   LV e' medial:    8.27 cm/s  LV PW:         2.10 cm   LV E/e' medial:  20.2  LV IVS:        1.89 cm   LV e' lateral:   11.00 cm/s  LVOT diam:     2.00 cm   LV E/e' lateral: 15.2  LV SV:         50  LV SV Index:   19  LVOT Area:     3.14 cm     RIGHT  VENTRICLE  RV Basal diam:  3.78 cm  RV Mid  diam:    3.14 cm  RV S prime:     12.90 cm/s  TAPSE (M-mode): 1.8 cm   LEFT ATRIUM             Index        RIGHT ATRIUM           Index  LA diam:        4.00 cm 1.55 cm/m   RA Area:     24.90 cm  LA Vol (A2C):   59.5 ml 23.04 ml/m  RA Volume:   78.10 ml  30.25 ml/m  LA Vol (A4C):   45.4 ml 17.58 ml/m  LA Biplane Vol: 52.1 ml 20.18 ml/m   AORTIC VALVE  AV Area (Vmax):    0.58 cm  AV Area (Vmean):   0.48 cm  AV Area (VTI):     0.59 cm  AV Vmax:           439.00 cm/s  AV Vmean:          340.000 cm/s  AV VTI:            0.839 m  AV Peak Grad:      77.1 mmHg  AV Mean Grad:      51.0 mmHg  LVOT Vmax:         81.30 cm/s  LVOT Vmean:        52.200 cm/s  LVOT VTI:          0.158 m  LVOT/AV VTI ratio: 0.19    AORTA  Ao Root diam: 2.90 cm  Ao Asc diam:  3.90 cm   MITRAL VALVE  MV Area (PHT): 3.12 cm     SHUNTS  MV Mean grad:  11.0 mmHg    Systemic VTI:  0.16 m  MV Decel Time: 243 msec     Systemic Diam: 2.00 cm  MV E velocity: 167.00 cm/s  MV A velocity: 68.70 cm/s  MV E/A ratio:  2.43   Recent Labs: 07/29/2022: ALT 19; BUN 30; Creatinine, Ser 1.42; Hemoglobin 13.0; Magnesium 1.9; Platelets 211; Potassium 3.7; Sodium 139   Lipid Panel    Component Value Date/Time   CHOL 96 12/04/2019 0503   TRIG 59 12/04/2019 0503   HDL 32 (L) 12/04/2019 0503   CHOLHDL 3.0 12/04/2019 0503   VLDL 12 12/04/2019 0503   LDLCALC 52 12/04/2019 0503     Wt Readings from Last 3 Encounters:  08/09/22 (!) 319 lb 9.6 oz (145 kg)  07/29/22 300 lb (136.1 kg)  07/09/22 (!) 322 lb 3.2 oz (146.1 kg)    Assessment and Plan:   1. Severe Aortic Valve Stenosis: He has severe, stage D aortic valve stenosis. He is NYHA class 3. I have personally reviewed the echo images. The aortic valve is thickened, calcified with limited leaflet mobility. I think he would benefit from AVR. Given advanced age, he is not a good candidate for conventional AVR by surgical  approach. I think he may be a good candidate for TAVR.   I have reviewed the natural history of aortic stenosis with the patient and their family members  who are present today. We have discussed the limitations of medical therapy and the poor prognosis associated with symptomatic aortic stenosis. We have reviewed potential treatment options, including palliative medical therapy, conventional surgical aortic valve replacement, and transcatheter aortic valve replacement. We discussed treatment options in the context of the patient's specific comorbid medical conditions.   He would like to  proceed with planning for TAVR. I will arrange a right and left heart catheterization at [facility] 08/29/21. Risks and benefits of the cath procedure and the valve procedure are reviewed with the patient. After the cath, he will have a cardiac CT, CTA of the chest/abdomen and pelvis and he will then be referred to see one of the CT surgeons on our TAVR team.   -Hold Eliquis for one day before cath. No Lovenox bridge. I reviewed this with our pharmacy team.  BMET and CBD week of cardiac cath.  -Increase Toprol to 50 mg daily given sinus tachycardia.   Labs/ tests ordered today include:  No orders of the defined types were placed in this encounter.  Disposition:   F/U with the valve team.   Signed, Verne Carrow, MD, [facility] 08/09/2022 9:52 AM    [facility] [address] [place], [place], [place]  [address] Phone: [phone]; Fax: [phone]

## 2022-08-09 NOTE — Patient Instructions (Addendum)
Medication Instructions:  Your physician has recommended you make the following change in your medication:  1.) increase metoprolol succinate (Toprol XL) to 50 mg daily  *If you need a refill on your cardiac medications before your next appointment, please call your pharmacy*   Lab Work: Aug 26, 2022 (bmet, cbc)   Testing/Procedures: Your physician has requested that you have a cardiac catheterization. Cardiac catheterization is used to diagnose and/or treat various heart conditions. Doctors may recommend this procedure for a number of different reasons. The most common reason is to evaluate chest pain. Chest pain can be a symptom of coronary artery disease (CAD), and cardiac catheterization can show whether plaque is narrowing or blocking your heart's arteries. This procedure is also used to evaluate the valves, as well as measure the blood flow and oxygen levels in different parts of your heart. For further information please visit [email or web address] Please follow instruction sheet, as given.   Follow-Up: Per Structural Heart Team  Other Instructions  [facility] A DEPT OF [place] A DEPT OF [facility] [address] [place], [place] [address] [id number] [facility] [place] [address] Dept: [phone] Loc: [phone]  GIOVANIE LEFEBRE  08/09/2022  You are scheduled for a Cardiac Catheterization on Thursday, January 11 with Dr. Verne Carrow.  1. Please arrive at the [facility] (Main Entrance A) at [facility]: [address] [place], [place] [address] at 7:00 AM (This time is two hours before your procedure to ensure your preparation). Free valet parking service is available.   Special note: Every effort is made to have your procedure done on time. Please understand that emergencies sometimes delay scheduled procedures.  2. Diet: Do not eat solid foods after midnight.  The patient may have  clear liquids until 5am upon the day of the procedure.  3. Labs: You will need to have blood drawn Monday Aug 26, 2022.  You do not need to be fasting.  4. Medication instructions in preparation for your procedure:   Contrast Allergy: No  DO NOT TAKE ELIQUIS ON WEDNESDAY AND THURSDAY JAN 10 OR 11.  DO NOT TAKE LASIX THURSDAY JAN 11   On the morning of your procedure, take your Aspirin 81 mg and any morning medicines NOT listed above.  You may use sips of water.  5. Plan for one night stay--bring personal belongings. 6. Bring a current list of your medications and current insurance cards. 7. You MUST have a responsible person to drive you home. 8. Someone MUST be with you the first 24 hours after you arrive home or your discharge will be delayed. 9. Please wear clothes that are easy to get on and off and wear slip-on shoes.  Thank you for allowing [place] to care for you!   -- [facility]

## 2022-08-09 NOTE — H&P (View-Only) (Signed)
Structural Heart Clinic Consult Note  Chief Complaint  Patient presents with   New Patient (Initial Visit)    Severe aortic stenosis    History of Present Illness: 76 yo male with history of chronic diastolic CHF, asthma, DM, HTN, hyperlipidemia, paroxysmal atrial fibrillation, prior CVA and severe aortic stenosis who is here today as a new consult, referred by Dr. Cristal Deer, for further discussion regarding his aortic stenosis and possible TAVR. He has atrial fibrillation and has undergone atrial fibrillation ablation in July 2021. He has been on Eliquis. He had a stroke in April of 2021. Echo 07/31/22 with LVEF=50-55%. Mild LVH. Severe aortic stenosis with mean gradient 51 mmHg, peak gradient 77 mmHg, AVA 0.48 cm2, DI 0.19, SVI 19.   He tells me today that he has progressive dyspnea with exertion, chest pain with exertion, LE edema (for years) and fatigue. No dizziness or near syncope. He lives alone in [place], [place]. He is a retired Armed forces logistics/support/administrative officer. He has one daughter who lives in [place], [place]. She is on the phone today during our visit. He has no active dental issues. He sees a Education officer, community regularly.   Primary Care Physician: Merri Brunette, MD Primary Cardiologist: Cristal Deer Referring Cardiologist: Cristal Deer  Past Medical History:  Diagnosis Date   Allergic rhinitis, cause unspecified    Asthma    Atrial fibrillation (HCC)    Cataract cortical, senile, bilateral    Macular degeneration of left eye    Other and unspecified hyperlipidemia    Type II or unspecified type diabetes mellitus without mention of complication, not stated as uncontrolled    Unspecified essential hypertension     Past Surgical History:  Procedure Laterality Date   ATRIAL FIBRILLATION ABLATION N/A 03/17/2020   Procedure: ATRIAL FIBRILLATION ABLATION;  Surgeon: Regan Lemming, MD;  Location: [facility] INVASIVE CV LAB;  Service: Cardiovascular;  Laterality: N/A;   CARDIOVERSION N/A 09/22/2019    Procedure: CARDIOVERSION;  Surgeon: [name], MD;  Location: [facility] ENDOSCOPY;  Service: Endoscopy;  Laterality: N/A;   CARDIOVERSION N/A 10/18/2019   Procedure: CARDIOVERSION;  Surgeon: Jake Bathe, MD;  Location: [facility] ENDOSCOPY;  Service: Cardiovascular;  Laterality: N/A;   CATARACT EXTRACTION, BILATERAL  08/2020   TEE WITHOUT CARDIOVERSION N/A 09/22/2019   Procedure: TRANSESOPHAGEAL ECHOCARDIOGRAM (TEE);  Surgeon: [name], MD;  Location: [facility] ENDOSCOPY;  Service: Endoscopy;  Laterality: N/A;   TONSILLECTOMY      Current Outpatient Medications  Medication Sig Dispense Refill   aspirin EC 81 MG EC tablet Take 1 tablet (81 mg total) by mouth daily. 60 tablet 0   atorvastatin (LIPITOR) 80 MG tablet Take 1 tablet (80 mg total) by mouth every evening. 30 tablet 0   B-D ULTRAFINE III SHORT PEN 31G X 8 MM MISC SMARTSIG:Pre-Filled Pen Syringe SUB-Q 3 Times Daily     Cholecalciferol (VITAMIN D3) 50 MCG (2000 UT) TABS Take 50 mcg by mouth daily.     ELIQUIS 5 MG TABS tablet TAKE 1 TABLET(5 MG) BY MOUTH TWICE DAILY 180 tablet 2   fluticasone-salmeterol (ADVAIR) 250-50 MCG/ACT AEPB INHALE 1 PUFF INTO THE LUNGS IN THE MORNING AND AT BEDTIME 60 each 11   furosemide (LASIX) 40 MG tablet Take 1 tablet (40 mg total) by mouth daily. 90 tablet 3   levalbuterol (XOPENEX HFA) 45 MCG/ACT inhaler 1 puff as needed Inhalation every 6 hrs     metoprolol succinate (TOPROL-XL) 50 MG 24 hr tablet Take 1 tablet (50 mg total) by mouth daily. Take  with or immediately following a meal. 90 tablet 3   Multiple Vitamin (MULTIVITAMIN WITH MINERALS) TABS tablet Take 1 tablet by mouth daily.     MYRBETRIQ 25 MG TB24 tablet Take 25 mg by mouth daily.     NOVOLOG MIX 70/30 FLEXPEN (70-30) 100 UNIT/ML FlexPen Inject 60 Units into the skin in the morning and at bedtime.     ONETOUCH VERIO test strip 1 each by Other route in the morning, at noon, and at bedtime.      polyethylene glycol (MIRALAX / GLYCOLAX)  17 g packet Take 17 g by mouth daily. 14 each 0   PREVIDENT 5000 BOOSTER PLUS 1.1 % PSTE Place 1 application onto teeth in the morning and at bedtime.      sertraline (ZOLOFT) 100 MG tablet Take 100 mg by mouth daily.     No current facility-administered medications for this visit.    No Known Allergies  Social History   Socioeconomic History   Marital status: Divorced    Spouse name: Not on file   Number of children: 1   Years of education: Not on file   Highest education level: Professional school degree (e.g., MD, DDS, DVM, JD)  Occupational History   Occupation: Defense attorney  Tobacco Use   Smoking status: Never   Smokeless tobacco: Never  Vaping Use   Vaping Use: Never used  Substance and Sexual Activity   Alcohol use: Not Currently    Comment: Used to drink 2 L bottle of wine/day. Quit 09/18/19   Drug use: Never   Sexual activity: Not Currently  Other Topics Concern   Not on file  Social History Narrative   Not on file   Social Determinants of Health   Financial Resource Strain: Not on file  Food Insecurity: No Food Insecurity (09/27/2019)   Hunger Vital Sign    Worried About Running Out of Food in the Last Year: Never true    Ran Out of Food in the Last Year: Never true  Transportation Needs: No Transportation Needs (09/27/2019)   PRAPARE - Administrator, Civil Service (Medical): No    Lack of Transportation (Non-Medical): No  Physical Activity: Not on file  Stress: Not on file  Social Connections: Not on file  Intimate Partner Violence: Not on file    Family History  Problem Relation Age of Onset   Colon cancer Father    Heart disease Paternal Grandfather     Review of Systems:  As stated in the HPI and otherwise negative.   BP 118/60   Pulse (!) 122   Ht 6\' 2"  (1.88 m)   Wt (!) 319 lb 9.6 oz (145 kg)   SpO2 98%   BMI 41.03 kg/m   Physical Examination: General: Well developed, well nourished, NAD  HEENT: OP clear, mucus membranes  moist  SKIN: warm, dry. No rashes. Neuro: No focal deficits  Musculoskeletal: Muscle strength 5/5 all ext  Psychiatric: Mood and affect normal  Neck: No JVD, no carotid bruits, no thyromegaly, no lymphadenopathy.  Lungs:Clear bilaterally, no wheezes, rhonci, crackles Cardiovascular: Regular rate and rhythm. Loud, harsh, late peaking systolic murmur.  Abdomen:Soft. Bowel sounds present. Non-tender.  Extremities: Trace bilateral lower extremity edema. Pulses are 2 + in the bilateral DP/PT.  EKG:  EKG is not ordered today. The ekg ordered today demonstrates  EKG from 07/31/22 with sinus tachycardia  Echo 07/31/22:  1. Left ventricular ejection fraction, by estimation, is 50 to 55%. The  left  ventricle has low normal function. The left ventricle has no regional  wall motion abnormalities. There is mild left ventricular hypertrophy.  Left ventricular diastolic  parameters are consistent with Grade III diastolic dysfunction  (restrictive).   2. Right ventricular systolic function is normal. The right ventricular  size is normal. Tricuspid regurgitation signal is inadequate for assessing  PA pressure.   3. The mitral valve is degenerative. No evidence of mitral valve  regurgitation. Moderate mitral annular calcification.   4. The aortic valve is calcified. Aortic valve regurgitation is mild.  Severe aortic valve stenosis.   5. There is borderline dilatation of the ascending aorta, measuring 39  mm.   6. The inferior vena cava is normal in size with greater than 50%  respiratory variability, suggesting right atrial pressure of 3 mmHg.   Conclusion(s)/Recommendation(s): Compared to prior TTE, AS gradient has  increased, it is now severe.   FINDINGS   Left Ventricle: Left ventricular ejection fraction, by estimation, is 50  to 55%. The left ventricle has low normal function. The left ventricle has  no regional wall motion abnormalities. Definity contrast agent was given  IV to  delineate the left  ventricular endocardial borders. The left ventricular internal cavity size  was normal in size. There is mild left ventricular hypertrophy. Left  ventricular diastolic parameters are consistent with Grade III diastolic  dysfunction (restrictive).   Right Ventricle: The right ventricular size is normal. Right ventricular  systolic function is normal. Tricuspid regurgitation signal is inadequate  for assessing PA pressure.   Left Atrium: Left atrial size was normal in size.   Right Atrium: Right atrial size was normal in size.   Pericardium: There is no evidence of pericardial effusion.   Mitral Valve: The mitral valve is degenerative in appearance. There is  moderate thickening of the mitral valve leaflet(s). Moderate mitral  annular calcification. No evidence of mitral valve regurgitation. The mean  mitral valve gradient is 11.0 mmHg.   Tricuspid Valve: Tricuspid valve regurgitation is not demonstrated.   Aortic Valve: The aortic valve is calcified. Aortic valve regurgitation is  mild. Severe aortic stenosis is present. Aortic valve mean gradient  measures 51.0 mmHg. Aortic valve peak gradient measures 77.1 mmHg. Aortic  valve area, by VTI measures 0.59 cm.   Pulmonic Valve: Pulmonic valve regurgitation is not visualized.   Aorta: There is borderline dilatation of the ascending aorta, measuring 39  mm.   Venous: The inferior vena cava is normal in size with greater than 50%  respiratory variability, suggesting right atrial pressure of 3 mmHg.   IAS/Shunts: No atrial level shunt detected by color flow Doppler.     LEFT VENTRICLE  PLAX 2D  LVIDd:         3.90 cm   Diastology  LVIDs:         2.89 cm   LV e' medial:    8.27 cm/s  LV PW:         2.10 cm   LV E/e' medial:  20.2  LV IVS:        1.89 cm   LV e' lateral:   11.00 cm/s  LVOT diam:     2.00 cm   LV E/e' lateral: 15.2  LV SV:         50  LV SV Index:   19  LVOT Area:     3.14 cm     RIGHT  VENTRICLE  RV Basal diam:  3.78 cm  RV Mid  diam:    3.14 cm  RV S prime:     12.90 cm/s  TAPSE (M-mode): 1.8 cm   LEFT ATRIUM             Index        RIGHT ATRIUM           Index  LA diam:        4.00 cm 1.55 cm/m   RA Area:     24.90 cm  LA Vol (A2C):   59.5 ml 23.04 ml/m  RA Volume:   78.10 ml  30.25 ml/m  LA Vol (A4C):   45.4 ml 17.58 ml/m  LA Biplane Vol: 52.1 ml 20.18 ml/m   AORTIC VALVE  AV Area (Vmax):    0.58 cm  AV Area (Vmean):   0.48 cm  AV Area (VTI):     0.59 cm  AV Vmax:           439.00 cm/s  AV Vmean:          340.000 cm/s  AV VTI:            0.839 m  AV Peak Grad:      77.1 mmHg  AV Mean Grad:      51.0 mmHg  LVOT Vmax:         81.30 cm/s  LVOT Vmean:        52.200 cm/s  LVOT VTI:          0.158 m  LVOT/AV VTI ratio: 0.19    AORTA  Ao Root diam: 2.90 cm  Ao Asc diam:  3.90 cm   MITRAL VALVE  MV Area (PHT): 3.12 cm     SHUNTS  MV Mean grad:  11.0 mmHg    Systemic VTI:  0.16 m  MV Decel Time: 243 msec     Systemic Diam: 2.00 cm  MV E velocity: 167.00 cm/s  MV A velocity: 68.70 cm/s  MV E/A ratio:  2.43   Recent Labs: 07/29/2022: ALT 19; BUN 30; Creatinine, Ser 1.42; Hemoglobin 13.0; Magnesium 1.9; Platelets 211; Potassium 3.7; Sodium 139   Lipid Panel    Component Value Date/Time   CHOL 96 12/04/2019 0503   TRIG 59 12/04/2019 0503   HDL 32 (L) 12/04/2019 0503   CHOLHDL 3.0 12/04/2019 0503   VLDL 12 12/04/2019 0503   LDLCALC 52 12/04/2019 0503     Wt Readings from Last 3 Encounters:  08/09/22 (!) 319 lb 9.6 oz (145 kg)  07/29/22 300 lb (136.1 kg)  07/09/22 (!) 322 lb 3.2 oz (146.1 kg)    Assessment and Plan:   1. Severe Aortic Valve Stenosis: He has severe, stage D aortic valve stenosis. He is NYHA class 3. I have personally reviewed the echo images. The aortic valve is thickened, calcified with limited leaflet mobility. I think he would benefit from AVR. Given advanced age, he is not a good candidate for conventional AVR by surgical  approach. I think he may be a good candidate for TAVR.   I have reviewed the natural history of aortic stenosis with the patient and their family members  who are present today. We have discussed the limitations of medical therapy and the poor prognosis associated with symptomatic aortic stenosis. We have reviewed potential treatment options, including palliative medical therapy, conventional surgical aortic valve replacement, and transcatheter aortic valve replacement. We discussed treatment options in the context of the patient's specific comorbid medical conditions.   He would like to  proceed with planning for TAVR. I will arrange a right and left heart catheterization at [facility] 08/29/21. Risks and benefits of the cath procedure and the valve procedure are reviewed with the patient. After the cath, he will have a cardiac CT, CTA of the chest/abdomen and pelvis and he will then be referred to see one of the CT surgeons on our TAVR team.   -Hold Eliquis for one day before cath. No Lovenox bridge. I reviewed this with our pharmacy team.  BMET and CBD week of cardiac cath.  -Increase Toprol to 50 mg daily given sinus tachycardia.   Labs/ tests ordered today include:  No orders of the defined types were placed in this encounter.  Disposition:   F/U with the valve team.   Signed, Verne Carrow, MD, [facility] 08/09/2022 9:52 AM    [facility] [address] [place], [place], [place]  [address] Phone: [phone]; Fax: [phone]

## 2022-08-09 NOTE — Progress Notes (Signed)
Pre Surgical Assessment: 5 M Walk Test  18M=16.27ft  5 Meter Walk Test- trial 1: 5.39 seconds 5 Meter Walk Test- trial 2: 6.57 seconds 5 Meter Walk Test- trial 3: 6.37 seconds 5 Meter Walk Test Average: 6.11 seconds  Pt reports slow ambulation due to "bad knees"

## 2022-08-09 NOTE — Addendum Note (Signed)
Addended by: Lendon Ka on: 08/09/2022 10:19 AM   Modules accepted: Orders

## 2022-08-13 ENCOUNTER — Other Ambulatory Visit: Payer: Self-pay

## 2022-08-13 ENCOUNTER — Emergency Department (HOSPITAL_COMMUNITY)
Admission: EM | Admit: 2022-08-13 | Discharge: 2022-08-13 | Disposition: A | Discharge status: Home / Self Care | Payer: Medicare Other | Attending: Emergency Medicine | Admitting: Emergency Medicine

## 2022-08-13 ENCOUNTER — Encounter (HOSPITAL_BASED_OUTPATIENT_CLINIC_OR_DEPARTMENT_OTHER): Payer: Self-pay

## 2022-08-13 ENCOUNTER — Emergency Department (HOSPITAL_COMMUNITY): Payer: Medicare Other

## 2022-08-13 DIAGNOSIS — R000 Tachycardia, unspecified: Secondary | ICD-10-CM | POA: Diagnosis not present

## 2022-08-13 DIAGNOSIS — K4090 Unilateral inguinal hernia, without obstruction or gangrene, not specified as recurrent: Secondary | ICD-10-CM | POA: Diagnosis not present

## 2022-08-13 DIAGNOSIS — Z7982 Long term (current) use of aspirin: Secondary | ICD-10-CM | POA: Diagnosis not present

## 2022-08-13 DIAGNOSIS — Z794 Long term (current) use of insulin: Secondary | ICD-10-CM | POA: Diagnosis not present

## 2022-08-13 DIAGNOSIS — R109 Unspecified abdominal pain: Secondary | ICD-10-CM | POA: Diagnosis present

## 2022-08-13 DIAGNOSIS — I10 Essential (primary) hypertension: Secondary | ICD-10-CM | POA: Insufficient documentation

## 2022-08-13 DIAGNOSIS — I700 Atherosclerosis of aorta: Secondary | ICD-10-CM | POA: Diagnosis not present

## 2022-08-13 DIAGNOSIS — Z7901 Long term (current) use of anticoagulants: Secondary | ICD-10-CM | POA: Diagnosis not present

## 2022-08-13 DIAGNOSIS — E119 Type 2 diabetes mellitus without complications: Secondary | ICD-10-CM | POA: Insufficient documentation

## 2022-08-13 DIAGNOSIS — N132 Hydronephrosis with renal and ureteral calculous obstruction: Secondary | ICD-10-CM | POA: Diagnosis not present

## 2022-08-13 DIAGNOSIS — J45909 Unspecified asthma, uncomplicated: Secondary | ICD-10-CM | POA: Diagnosis not present

## 2022-08-13 DIAGNOSIS — K429 Umbilical hernia without obstruction or gangrene: Secondary | ICD-10-CM | POA: Diagnosis not present

## 2022-08-13 DIAGNOSIS — N200 Calculus of kidney: Secondary | ICD-10-CM

## 2022-08-13 LAB — CBC
HCT: 41 % (ref 39.0–52.0)
Hemoglobin: 13.2 g/dL (ref 13.0–17.0)
MCH: 30 pg (ref 26.0–34.0)
MCHC: 32.2 g/dL (ref 30.0–36.0)
MCV: 93.2 fL (ref 80.0–100.0)
Platelets: 219 10*3/uL (ref 150–400)
RBC: 4.4 MIL/uL (ref 4.22–5.81)
RDW: 13.8 % (ref 11.5–15.5)
WBC: 13.1 10*3/uL — ABNORMAL HIGH (ref 4.0–10.5)
nRBC: 0 % (ref 0.0–0.2)

## 2022-08-13 LAB — URINALYSIS, ROUTINE W REFLEX MICROSCOPIC

## 2022-08-13 LAB — BASIC METABOLIC PANEL
Anion gap: 12 (ref 5–15)
BUN: 25 mg/dL — ABNORMAL HIGH (ref 8–23)
CO2: 26 mmol/L (ref 22–32)
Calcium: 9.2 mg/dL (ref 8.9–10.3)
Chloride: 103 mmol/L (ref 98–111)
Creatinine, Ser: 1.41 mg/dL — ABNORMAL HIGH (ref 0.61–1.24)
GFR, Estimated: 52 mL/min — ABNORMAL LOW (ref >60)
Glucose, Bld: 172 mg/dL — ABNORMAL HIGH (ref 70–99)
Potassium: 4.2 mmol/L (ref 3.5–5.1)
Sodium: 141 mmol/L (ref 135–145)

## 2022-08-13 LAB — CBG MONITORING, ED: Glucose-Capillary: 166 mg/dL — ABNORMAL HIGH (ref 70–99)

## 2022-08-13 LAB — URINALYSIS, MICROSCOPIC (REFLEX): RBC / HPF: >50 RBC/hpf (ref 0–5)

## 2022-08-13 MED ORDER — OXYCODONE-ACETAMINOPHEN 5-325 MG PO TABS
1.0000 | ORAL_TABLET | Freq: Once | ORAL | Status: AC
  Administered 2022-08-13: 1 via ORAL
  Filled 2022-08-13: qty 1

## 2022-08-13 MED ORDER — CEPHALEXIN 500 MG PO CAPS: 500.0000 mg | ORAL_CAPSULE | Freq: Four times a day (QID) | ORAL | 0 refills | Status: DC

## 2022-08-13 MED ORDER — OXYCODONE-ACETAMINOPHEN 5-325 MG PO TABS: 1.0000 | ORAL_TABLET | Freq: Four times a day (QID) | ORAL | 0 refills | Status: DC | PRN

## 2022-08-13 MED ORDER — ONDANSETRON 4 MG PO TBDP: 4.0000 mg | ORAL_TABLET | Freq: Three times a day (TID) | ORAL | 0 refills | Status: DC | PRN

## 2022-08-13 NOTE — ED Provider Notes (Signed)
[facility] EMERGENCY DEPARTMENT Provider Note   CSN: [id number] Arrival date & time: 08/13/22  0557     History  Chief Complaint  Patient presents with   Flank Pain    Brandon Sanchez is a 76 y.o. male.   Flank Pain  Patient recounts with right flank pain.  Feels like his previous kidney stones.  Began last night.  Improved somewhat now after getting oxycodone.  No fevers.  Heart rate is running fast but states that his heart rate has been fast for the last weeks.  Has seen cardiology for is on metoprolol.  He is on anticoagulation has a bad aortic valve and is due for a heart cath.    Past Medical History:  Diagnosis Date   Allergic rhinitis, cause unspecified    Asthma    Atrial fibrillation (HCC)    Cataract cortical, senile, bilateral    Macular degeneration of left eye    Other and unspecified hyperlipidemia    Type II or unspecified type diabetes mellitus without mention of complication, not stated as uncontrolled    Unspecified essential hypertension     Home Medications Prior to Admission medications   Medication Sig Start Date End Date Taking? Authorizing Provider  cephALEXin (KEFLEX) 500 MG capsule Take 1 capsule (500 mg total) by mouth 4 (four) times daily. 08/13/22  Yes Benjiman Core, MD  ondansetron (ZOFRAN-ODT) 4 MG disintegrating tablet Take 1 tablet (4 mg total) by mouth every 8 (eight) hours as needed for nausea or vomiting. 08/13/22  Yes Benjiman Core, MD  oxyCODONE-acetaminophen (PERCOCET/ROXICET) 5-325 MG tablet Take 1-2 tablets by mouth every 6 (six) hours as needed for severe pain. 08/13/22  Yes Benjiman Core, MD  aspirin EC 81 MG EC tablet Take 1 tablet (81 mg total) by mouth daily. 12/11/19   Rolly Salter, MD  atorvastatin (LIPITOR) 80 MG tablet Take 1 tablet (80 mg total) by mouth every evening. 12/17/19   Love, Evlyn Kanner, PA-C  B-D ULTRAFINE III SHORT PEN 31G X 8 MM MISC SMARTSIG:Pre-Filled Pen Syringe SUB-Q 3 Times  Daily 04/05/20   [provider]  Cholecalciferol (VITAMIN D3) 50 MCG (2000 UT) TABS Take 50 mcg by mouth daily.    [provider]  ELIQUIS 5 MG TABS tablet TAKE 1 TABLET(5 MG) BY MOUTH TWICE DAILY 12/24/21   Jodelle Red, MD  fluticasone-salmeterol (ADVAIR) 250-50 MCG/ACT AEPB INHALE 1 PUFF INTO THE LUNGS IN THE MORNING AND AT BEDTIME 06/07/22   Luciano Cutter, MD  furosemide (LASIX) 40 MG tablet Take 1 tablet (40 mg total) by mouth daily. 02/09/20   Jodelle Red, MD  levalbuterol [facility] HFA) 45 MCG/ACT inhaler 1 puff as needed Inhalation every 6 hrs    [provider]  metoprolol succinate (TOPROL-XL) 50 MG 24 hr tablet Take 1 tablet (50 mg total) by mouth daily. Take with or immediately following a meal. 08/09/22   Kathleene Hazel, MD  Multiple Vitamin (MULTIVITAMIN WITH MINERALS) TABS tablet Take 1 tablet by mouth daily.    [provider]  MYRBETRIQ 25 MG TB24 tablet Take 25 mg by mouth daily. 01/06/20   [provider]  NOVOLOG MIX 70/30 FLEXPEN (70-30) 100 UNIT/ML FlexPen Inject 60 Units into the skin in the morning and at bedtime. 05/23/20   [provider]  [facility] VERIO test strip 1 each by Other route in the morning, at noon, and at bedtime.  07/26/19   [provider]  polyethylene glycol (MIRALAX /  GLYCOLAX) 17 g packet Take 17 g by mouth daily. 12/11/19   Rolly Salter, MD  PREVIDENT 5000 BOOSTER PLUS 1.1 % PSTE Place 1 application onto teeth in the morning and at bedtime.  04/13/19   [provider]  sertraline (ZOLOFT) 100 MG tablet Take 100 mg by mouth daily.    [provider]      Allergies    Patient has no known allergies.    Review of Systems   Review of Systems  Genitourinary:  Positive for flank pain.    Physical Exam Updated Vital Signs BP 124/86   Pulse (!) 117   Temp 98.7 F (37.1 C)   Resp 18   SpO2 95%  Physical Exam Vitals and nursing note  reviewed.  Constitutional:      Comments: Patient appears comfortable.  Cardiovascular:     Rate and Rhythm: Regular rhythm. Tachycardia present.  Abdominal:     Tenderness: There is no abdominal tenderness. There is no guarding.  Genitourinary:    Comments: Some CVA tenderness on the right. Musculoskeletal:     Cervical back: Neck supple.  Skin:    General: Skin is warm.  Neurological:     Mental Status: He is alert and oriented to person, place, and time.     ED Results / Procedures / Treatments   Labs (all labs ordered are listed, but only abnormal results are displayed) Labs Reviewed  URINALYSIS, ROUTINE W REFLEX MICROSCOPIC - Abnormal; Notable for the following components:      Result Value   Color, Urine RED (*)    APPearance TURBID (*)    Glucose, UA   (*)    Value: TEST NOT REPORTED DUE TO COLOR INTERFERENCE OF URINE PIGMENT   Hgb urine dipstick   (*)    Value: TEST NOT REPORTED DUE TO COLOR INTERFERENCE OF URINE PIGMENT   Bilirubin Urine   (*)    Value: TEST NOT REPORTED DUE TO COLOR INTERFERENCE OF URINE PIGMENT   Ketones, ur   (*)    Value: TEST NOT REPORTED DUE TO COLOR INTERFERENCE OF URINE PIGMENT   Protein, ur   (*)    Value: TEST NOT REPORTED DUE TO COLOR INTERFERENCE OF URINE PIGMENT   Nitrite   (*)    Value: TEST NOT REPORTED DUE TO COLOR INTERFERENCE OF URINE PIGMENT   Leukocytes,Ua   (*)    Value: TEST NOT REPORTED DUE TO COLOR INTERFERENCE OF URINE PIGMENT   All other components within normal limits  BASIC METABOLIC PANEL - Abnormal; Notable for the following components:   Glucose, Bld 172 (*)    BUN 25 (*)    Creatinine, Ser 1.41 (*)    GFR, Estimated 52 (*)    All other components within normal limits  CBC - Abnormal; Notable for the following components:   WBC 13.1 (*)    All other components within normal limits  URINALYSIS, MICROSCOPIC (REFLEX) - Abnormal; Notable for the following components:   Bacteria, UA MANY (*)    All other  components within normal limits  CBG MONITORING, ED - Abnormal; Notable for the following components:   Glucose-Capillary 166 (*)    All other components within normal limits  URINE CULTURE    EKG EKG Interpretation  Date/Time:  Tuesday August 13 2022 12:04:21 EST Ventricular Rate:  117 PR Interval:  144 QRS Duration: 106 QT Interval:  346 QTC Calculation: 482 R Axis:   -64 Text Interpretation: Sinus tachycardia Left  axis deviation Minimal voltage criteria for LVH, may be normal variant ( Cornell product ) Septal infarct , age undetermined Abnormal ECG When compared with ECG of 29-Jul-2022 23:53, No significant change since last tracing Confirmed by Benjiman CorePickering, Azilee Pirro [phone]([id number]) on 08/13/2022 12:52:41 PM  Radiology CT Renal Stone Study  Result Date: 08/13/2022 CLINICAL DATA:  Right flank pain EXAM: CT ABDOMEN AND PELVIS WITHOUT CONTRAST TECHNIQUE: Multidetector CT imaging of the abdomen and pelvis was performed following the standard protocol without IV contrast. RADIATION DOSE REDUCTION: This exam was performed according to the departmental dose-optimization program which includes automated exposure control, adjustment of the mA and/or kV according to patient size and/or use of iterative reconstruction technique. COMPARISON:  CT abdomen and pelvis dated December 13, 2020 FINDINGS: Lower chest: Mild centrilobular emphysema. No acute abnormality. Hepatobiliary: No focal liver abnormality is seen. No gallstones, gallbladder wall thickening, or biliary dilatation. Pancreas: Unremarkable. No pancreatic ductal dilatation or surrounding inflammatory changes. Spleen: Normal in size without focal abnormality. Adrenals/Urinary Tract: Bilateral adrenal glands are unremarkable. Asymmetric right perinephric fat stranding. Moderate right hydronephrosis with obstructing 4 mm stone in the mid right ureter on series 3, image 43. Additional bilateral nonobstructing renal stones are seen. No left hydronephrosis.  Bladder is decompressed. Stomach/Bowel: Stomach is within normal limits. Appendix appears normal. No evidence of bowel wall thickening, distention, or inflammatory changes. Vascular/Lymphatic: Aortic atherosclerosis. No enlarged abdominal or pelvic lymph nodes. Reproductive: Prostate is unremarkable. Other: Small fat containing inguinal hernias and tiny periumbilical hernia. No abdominopelvic ascites. Focal subcutaneous soft tissue stranding of the right lower anterior abdominal wall seen on series 3, image 83, slightly increased when compared with the prior exam. Musculoskeletal: No acute or significant osseous findings. IMPRESSION: 1. Moderate right hydronephrosis with obstructing 4 mm stone in the mid right ureter. 2. Additional bilateral nonobstructing renal stones are seen. 3. Focal subcutaneous soft tissue stranding of the right lower anterior abdominal wall, slightly increased when compared with the prior exam, possibly secondary to subcutaneous injection. Correlate with physical exam. 4. Aortic Atherosclerosis (ICD10-I70.0) and Emphysema (ICD10-J43.9). Electronically Signed   By: Allegra LaiLeah  Strickland M.D.   On: 08/13/2022 10:48    Procedures Procedures    Medications Ordered in ED Medications  oxyCODONE-acetaminophen (PERCOCET/ROXICET) 5-325 MG per tablet 1 tablet (1 tablet Oral Given 08/13/22 0605)  oxyCODONE-acetaminophen (PERCOCET/ROXICET) 5-325 MG per tablet 1 tablet (1 tablet Oral Given 08/13/22 1254)    ED Course/ Medical Decision Making/ A&P                           Medical Decision Making Amount and/or Complexity of Data Reviewed Labs: ordered.  Risk Prescription drug management.   Patient with right flank pain with history of kidney stones.  No fevers.  Had some nausea and vomited once.  Pain improved now.  White count mildly elevated.  Good kidney function but urine shows hematuria and does have many bacteria.  CT scan done and showed mid ureteral kidney stone.  Feeling better,  however does increase risk of infected obstructed stone.  Attempting to discuss with urology. Patient is tachycardic.  Appears sinus.  Is had persistent tachycardia for a while now is seeing cardiology for it.  Does not appear to be related to sepsis at this time.  Discussed with Dr. Marlou PorchHerrick from neurology.  Will give antibiotics and cultures been sent.  If worsens will return.  Pain control and antibiotics.        Final Clinical Impression(s) /  ED Diagnoses Final diagnoses:  Kidney stone    Rx / DC Orders ED Discharge Orders          Ordered    cephALEXin (KEFLEX) 500 MG capsule  4 times daily        08/13/22 1350    oxyCODONE-acetaminophen (PERCOCET/ROXICET) 5-325 MG tablet  Every 6 hours PRN        08/13/22 1350    ondansetron (ZOFRAN-ODT) 4 MG disintegrating tablet  Every 8 hours PRN        08/13/22 1350              Benjiman Core, MD 08/13/22 1540

## 2022-08-13 NOTE — ED Triage Notes (Signed)
Patient reports left flank pain this morning , no emesis or hematuria . History of kidney stones.

## 2022-08-13 NOTE — Discharge Instructions (Signed)
Watch for signs of worsening infection.  Return if he develops fever.

## 2022-08-13 NOTE — ED Notes (Signed)
Patient c/o right flank pain 3/10, Pt states "it stones". No s/s of any distress. Will continue to monitor

## 2022-08-14 ENCOUNTER — Ambulatory Visit (HOSPITAL_BASED_OUTPATIENT_CLINIC_OR_DEPARTMENT_OTHER): Payer: Medicare Other | Admitting: Family

## 2022-08-15 LAB — URINE CULTURE: Culture: <10000 — AB

## 2022-08-16 NOTE — Telephone Encounter (Signed)
Update as requested 

## 2022-08-21 ENCOUNTER — Telehealth: Payer: Self-pay

## 2022-08-21 ENCOUNTER — Ambulatory Visit: Payer: Medicare Other | Attending: Cardiology

## 2022-08-21 DIAGNOSIS — R000 Tachycardia, unspecified: Secondary | ICD-10-CM

## 2022-08-21 NOTE — Telephone Encounter (Signed)
Please disregard- opened in error

## 2022-08-21 NOTE — Telephone Encounter (Signed)
     Patient  visit on 12/26  at [facility]   Have you been able to follow up with your primary care physician? Yes next week  The patient was or was not able to obtain any needed medicine or equipment. Yes   Are there diet recommendations that you are having difficulty following? Na   Patient expresses understanding of discharge instructions and education provided has no other needs at this time. Yes     [place], [facility], Care Management  [phone] [address], [place], [place] [address] Phone: [phone] Email: [email or web address]

## 2022-08-21 NOTE — Progress Notes (Unsigned)
Enrolled patient for a 3 day Zio XT monitor to be mailed to patients home  

## 2022-08-23 ENCOUNTER — Encounter: Payer: Self-pay | Admitting: Cardiovascular Disease

## 2022-08-26 ENCOUNTER — Ambulatory Visit: Payer: Medicare Other | Attending: Cardiovascular Disease

## 2022-08-26 DIAGNOSIS — I350 Nonrheumatic aortic (valve) stenosis: Secondary | ICD-10-CM

## 2022-08-26 DIAGNOSIS — Z01812 Encounter for preprocedural laboratory examination: Secondary | ICD-10-CM

## 2022-08-26 LAB — CBC

## 2022-08-27 ENCOUNTER — Telehealth: Payer: Self-pay | Admitting: *Deleted

## 2022-08-27 LAB — CBC
Hematocrit: 37.9 % (ref 37.5–51.0)
Hemoglobin: 12.4 g/dL — ABNORMAL LOW (ref 13.0–17.7)
MCH: 29.5 pg (ref 26.6–33.0)
MCHC: 32.7 g/dL (ref 31.5–35.7)
MCV: 90 fL (ref 79–97)
Platelets: 294 10*3/uL (ref 150–450)
RBC: 4.21 x10E6/uL (ref 4.14–5.80)
RDW: 12.7 % (ref 11.6–15.4)
WBC: 11.3 10*3/uL — ABNORMAL HIGH (ref 3.4–10.8)

## 2022-08-27 LAB — BASIC METABOLIC PANEL
BUN/Creatinine Ratio: 16 (ref 10–24)
BUN: 34 mg/dL — ABNORMAL HIGH (ref 8–27)
CO2: 22 mmol/L (ref 20–29)
Calcium: 9.3 mg/dL (ref 8.6–10.2)
Chloride: 102 mmol/L (ref 96–106)
Creatinine, Ser: 2.18 mg/dL — ABNORMAL HIGH (ref 0.76–1.27)
Glucose: 161 mg/dL — ABNORMAL HIGH (ref 70–99)
Potassium: 4.3 mmol/L (ref 3.5–5.2)
Sodium: 143 mmol/L (ref 134–144)
eGFR: 31 mL/min/{1.73_m2} — ABNORMAL LOW (ref >59)

## 2022-08-27 NOTE — Telephone Encounter (Signed)
Cardiac Catheterization scheduled at [facility] for: Thursday August 29, 2022 10:30 AM Arrival time and place: [facility] Main Entrance A at: 5:30 AM-pre procedure hydration  Nothing to eat after midnight prior to procedure, clear liquids until 5 AM day of procedure.  Medication instructions: -Hold:  Eliquis-none 08/28/22 until post procedure (hold one day before procedure-per Dr Angelena Form)  Lasix-day before and day of procedure-per protocol GFR 31 -Except hold medications usual morning medications can be taken with sips of water including aspirin 81 mg.  Confirmed patient has responsible adult to drive home post procedure and be with patient first 24 hours after arriving home.  Patient reports no new symptoms concerning for COVID-19 in the past 10 days.  Reviewed procedure instructions with patient.

## 2022-08-27 NOTE — Telephone Encounter (Signed)
Patient with history of kidney stones, reports went to ED 08/13/22 for right flank pain, dark urine, given antibiotics and discharged.  Patient reports not sure if he passed kidney stone, all symptoms related to this have resolved.

## 2022-08-29 ENCOUNTER — Encounter (HOSPITAL_COMMUNITY)
Admission: RE | Disposition: A | Discharge status: Home / Self Care | Payer: Self-pay | Source: Ambulatory Visit | Attending: Cardiovascular Disease

## 2022-08-29 ENCOUNTER — Ambulatory Visit (HOSPITAL_COMMUNITY)
Admission: RE | Admit: 2022-08-29 | Discharge: 2022-08-29 | Disposition: A | Discharge status: Home / Self Care | Payer: Medicare Other | Source: Ambulatory Visit | Attending: Cardiovascular Disease | Admitting: Cardiovascular Disease

## 2022-08-29 DIAGNOSIS — I352 Nonrheumatic aortic (valve) stenosis with insufficiency: Secondary | ICD-10-CM | POA: Diagnosis not present

## 2022-08-29 DIAGNOSIS — Z79899 Other long term (current) drug therapy: Secondary | ICD-10-CM | POA: Insufficient documentation

## 2022-08-29 DIAGNOSIS — I5032 Chronic diastolic (congestive) heart failure: Secondary | ICD-10-CM | POA: Diagnosis not present

## 2022-08-29 DIAGNOSIS — I2510 Atherosclerotic heart disease of native coronary artery without angina pectoris: Secondary | ICD-10-CM | POA: Insufficient documentation

## 2022-08-29 DIAGNOSIS — Z794 Long term (current) use of insulin: Secondary | ICD-10-CM | POA: Insufficient documentation

## 2022-08-29 DIAGNOSIS — I350 Nonrheumatic aortic (valve) stenosis: Secondary | ICD-10-CM

## 2022-08-29 DIAGNOSIS — I480 Paroxysmal atrial fibrillation: Secondary | ICD-10-CM | POA: Insufficient documentation

## 2022-08-29 DIAGNOSIS — I110 Hypertensive heart disease with heart failure: Secondary | ICD-10-CM | POA: Diagnosis not present

## 2022-08-29 DIAGNOSIS — Z8673 Personal history of transient ischemic attack (TIA), and cerebral infarction without residual deficits: Secondary | ICD-10-CM | POA: Diagnosis not present

## 2022-08-29 DIAGNOSIS — R000 Tachycardia, unspecified: Secondary | ICD-10-CM | POA: Diagnosis not present

## 2022-08-29 DIAGNOSIS — E785 Hyperlipidemia, unspecified: Secondary | ICD-10-CM | POA: Insufficient documentation

## 2022-08-29 DIAGNOSIS — Z7901 Long term (current) use of anticoagulants: Secondary | ICD-10-CM | POA: Diagnosis not present

## 2022-08-29 DIAGNOSIS — J45909 Unspecified asthma, uncomplicated: Secondary | ICD-10-CM | POA: Diagnosis not present

## 2022-08-29 DIAGNOSIS — I25119 Atherosclerotic heart disease of native coronary artery with unspecified angina pectoris: Secondary | ICD-10-CM

## 2022-08-29 DIAGNOSIS — E119 Type 2 diabetes mellitus without complications: Secondary | ICD-10-CM | POA: Insufficient documentation

## 2022-08-29 HISTORY — PX: RIGHT/LEFT HEART CATH AND CORONARY ANGIOGRAPHY: CATH118266

## 2022-08-29 LAB — POCT I-STAT EG7
Acid-base deficit: 3 mmol/L — ABNORMAL HIGH (ref 0.0–2.0)
Bicarbonate: 22.9 mmol/L (ref 20.0–28.0)
Calcium, Ion: 1.11 mmol/L — ABNORMAL LOW (ref 1.15–1.40)
HCT: 31 % — ABNORMAL LOW (ref 39.0–52.0)
Hemoglobin: 10.5 g/dL — ABNORMAL LOW (ref 13.0–17.0)
O2 Saturation: 60 %
Potassium: 3.5 mmol/L (ref 3.5–5.1)
Sodium: 146 mmol/L — ABNORMAL HIGH (ref 135–145)
TCO2: 24 mmol/L (ref 22–32)
pCO2, Ven: 43.3 mmHg — ABNORMAL LOW (ref 44–60)
pH, Ven: 7.33 (ref 7.25–7.43)
pO2, Ven: 33 mmHg (ref 32–45)

## 2022-08-29 LAB — POCT I-STAT 7, (LYTES, BLD GAS, ICA,H+H)
Acid-base deficit: 3 mmol/L — ABNORMAL HIGH (ref 0.0–2.0)
Bicarbonate: 22.3 mmol/L (ref 20.0–28.0)
Calcium, Ion: 1.23 mmol/L (ref 1.15–1.40)
HCT: 31 % — ABNORMAL LOW (ref 39.0–52.0)
Hemoglobin: 10.5 g/dL — ABNORMAL LOW (ref 13.0–17.0)
O2 Saturation: 93 %
Potassium: 3.8 mmol/L (ref 3.5–5.1)
Sodium: 143 mmol/L (ref 135–145)
TCO2: 24 mmol/L (ref 22–32)
pCO2 arterial: 40.3 mmHg (ref 32–48)
pH, Arterial: 7.351 (ref 7.35–7.45)
pO2, Arterial: 69 mmHg — ABNORMAL LOW (ref 83–108)

## 2022-08-29 LAB — BASIC METABOLIC PANEL
Anion gap: 9 (ref 5–15)
BUN: 29 mg/dL — ABNORMAL HIGH (ref 8–23)
CO2: 24 mmol/L (ref 22–32)
Calcium: 9.1 mg/dL (ref 8.9–10.3)
Chloride: 108 mmol/L (ref 98–111)
Creatinine, Ser: 1.99 mg/dL — ABNORMAL HIGH (ref 0.61–1.24)
GFR, Estimated: 34 mL/min — ABNORMAL LOW (ref >60)
Glucose, Bld: 162 mg/dL — ABNORMAL HIGH (ref 70–99)
Potassium: 3.7 mmol/L (ref 3.5–5.1)
Sodium: 141 mmol/L (ref 135–145)

## 2022-08-29 LAB — GLUCOSE, CAPILLARY
Glucose-Capillary: 150 mg/dL — ABNORMAL HIGH (ref 70–99)
Glucose-Capillary: 185 mg/dL — ABNORMAL HIGH (ref 70–99)

## 2022-08-29 SURGERY — RIGHT/LEFT HEART CATH AND CORONARY ANGIOGRAPHY
Anesthesia: LOCAL

## 2022-08-29 MED ORDER — HEPARIN SODIUM (PORCINE) 1000 UNIT/ML IJ SOLN
INTRAMUSCULAR | Status: DC | PRN
  Administered 2022-08-29: 6000 [IU] via INTRAVENOUS

## 2022-08-29 MED ORDER — FENTANYL CITRATE (PF) 100 MCG/2ML IJ SOLN
INTRAMUSCULAR | Status: DC | PRN
  Administered 2022-08-29: 50 ug via INTRAVENOUS

## 2022-08-29 MED ORDER — VERAPAMIL HCL 2.5 MG/ML IV SOLN
INTRAVENOUS | Status: DC | PRN
  Administered 2022-08-29: 10 mL via INTRA_ARTERIAL

## 2022-08-29 MED ORDER — LIDOCAINE HCL (PF) 1 % IJ SOLN
INTRAMUSCULAR | Status: DC | PRN
  Administered 2022-08-29 (×2): 2 mL

## 2022-08-29 MED ORDER — ONDANSETRON HCL 4 MG/2ML IJ SOLN: 4.0000 mg | Freq: Four times a day (QID) | INTRAMUSCULAR | Status: DC | PRN

## 2022-08-29 MED ORDER — VERAPAMIL HCL 2.5 MG/ML IV SOLN
INTRAVENOUS | Status: AC
  Filled 2022-08-29: qty 2

## 2022-08-29 MED ORDER — SODIUM CHLORIDE 0.9% FLUSH: 3.0000 mL | INTRAVENOUS | Status: DC | PRN

## 2022-08-29 MED ORDER — ACETAMINOPHEN 325 MG PO TABS: 650.0000 mg | ORAL_TABLET | ORAL | Status: DC | PRN

## 2022-08-29 MED ORDER — HEPARIN (PORCINE) IN NACL 1000-0.9 UT/500ML-% IV SOLN
INTRAVENOUS | Status: DC | PRN
  Administered 2022-08-29 (×2): 500 mL

## 2022-08-29 MED ORDER — SODIUM CHLORIDE 0.9 % IV SOLN: 250.0000 mL | INTRAVENOUS | Status: DC | PRN

## 2022-08-29 MED ORDER — HYDRALAZINE HCL 20 MG/ML IJ SOLN: 10.0000 mg | INTRAMUSCULAR | Status: DC | PRN

## 2022-08-29 MED ORDER — HEPARIN SODIUM (PORCINE) 1000 UNIT/ML IJ SOLN
INTRAMUSCULAR | Status: AC
  Filled 2022-08-29: qty 10

## 2022-08-29 MED ORDER — ASPIRIN 81 MG PO CHEW: 81.0000 mg | CHEWABLE_TABLET | ORAL | Status: DC

## 2022-08-29 MED ORDER — SODIUM CHLORIDE 0.9 % WEIGHT BASED INFUSION
1.0000 mL/kg/h | INTRAVENOUS | Status: DC
  Administered 2022-08-29: 1 mL/kg/h via INTRAVENOUS

## 2022-08-29 MED ORDER — MIDAZOLAM HCL 2 MG/2ML IJ SOLN
INTRAMUSCULAR | Status: AC
  Filled 2022-08-29: qty 2

## 2022-08-29 MED ORDER — FENTANYL CITRATE (PF) 100 MCG/2ML IJ SOLN
INTRAMUSCULAR | Status: AC
  Filled 2022-08-29: qty 2

## 2022-08-29 MED ORDER — MIDAZOLAM HCL 2 MG/2ML IJ SOLN
INTRAMUSCULAR | Status: DC | PRN
  Administered 2022-08-29: 2 mg via INTRAVENOUS

## 2022-08-29 MED ORDER — LABETALOL HCL 5 MG/ML IV SOLN: 10.0000 mg | INTRAVENOUS | Status: DC | PRN

## 2022-08-29 MED ORDER — SODIUM CHLORIDE 0.9% FLUSH: 3.0000 mL | Freq: Two times a day (BID) | INTRAVENOUS | Status: DC

## 2022-08-29 MED ORDER — HEPARIN (PORCINE) IN NACL 2000-0.9 UNIT/L-% IV SOLN
INTRAVENOUS | Status: AC
  Filled 2022-08-29: qty 2000

## 2022-08-29 MED ORDER — SODIUM CHLORIDE 0.9 % WEIGHT BASED INFUSION
3.0000 mL/kg/h | INTRAVENOUS | Status: AC
  Administered 2022-08-29: 3 mL/kg/h via INTRAVENOUS

## 2022-08-29 MED ORDER — LIDOCAINE HCL (PF) 1 % IJ SOLN
INTRAMUSCULAR | Status: AC
  Filled 2022-08-29: qty 30

## 2022-08-29 MED ORDER — SODIUM CHLORIDE 0.9 % IV SOLN: INTRAVENOUS | Status: AC

## 2022-08-29 SURGICAL SUPPLY — 14 items
CATH 5FR JL3.5 JR4 ANG PIG MP (CATHETERS) IMPLANT
CATH BALLN WEDGE 5F 110CM (CATHETERS) IMPLANT
CATH EXPO 5F MPA-1 (CATHETERS) IMPLANT
CATH INFINITI 5FR AL1 (CATHETERS) IMPLANT
DEVICE RAD COMP TR BAND LRG (VASCULAR PRODUCTS) IMPLANT
GLIDESHEATH SLEND SS 6F .021 (SHEATH) IMPLANT
GUIDEWIRE .025 260CM (WIRE) IMPLANT
GUIDEWIRE INQWIRE 1.5J.035X260 (WIRE) IMPLANT
INQWIRE 1.5J .035X260CM (WIRE) ×1
KIT HEART LEFT (KITS) ×1 IMPLANT
PACK CARDIAC CATHETERIZATION (CUSTOM PROCEDURE TRAY) ×1 IMPLANT
SHEATH GLIDE SLENDER 4/5FR (SHEATH) IMPLANT
TRANSDUCER W/STOPCOCK (MISCELLANEOUS) ×1 IMPLANT
TUBING CIL FLEX 10 FLL-RA (TUBING) ×1 IMPLANT

## 2022-08-29 NOTE — Discharge Instructions (Signed)
Resume Eliquis tomorrow 08/30/22 if no bleeding from cath sites in the right arm.

## 2022-08-29 NOTE — Interval H&P Note (Signed)
History and Physical Interval Note:  08/29/2022 9:21 AM  Brandon Sanchez  has presented today for surgery, with the diagnosis of severe aortic stenosis.  The various methods of treatment have been discussed with the patient and family. After consideration of risks, benefits and other options for treatment, the patient has consented to  Procedure(s): RIGHT/LEFT HEART CATH AND CORONARY ANGIOGRAPHY (N/A) as a surgical intervention.  The patient's history has been reviewed, patient examined, no change in status, stable for surgery.  I have reviewed the patient's chart and labs.  Questions were answered to the patient's satisfaction.    Cath Lab Visit (complete for each Cath Lab visit)  Clinical Evaluation Leading to the Procedure:   ACS: No.  Non-ACS:    Anginal Classification: CCS II  Anti-ischemic medical therapy: Minimal Therapy (1 class of medications)  Non-Invasive Test Results: No non-invasive testing performed  Prior CABG: No previous CABG        Lauree Chandler

## 2022-08-30 ENCOUNTER — Encounter (HOSPITAL_COMMUNITY): Payer: Self-pay | Admitting: Cardiovascular Disease

## 2022-09-03 ENCOUNTER — Encounter: Payer: Self-pay | Admitting: Cardiovascular Disease

## 2022-09-03 ENCOUNTER — Ambulatory Visit: Payer: Medicare Other | Attending: Cardiovascular Disease | Admitting: Cardiovascular Disease

## 2022-09-03 VITALS — BP 90/60 | HR 114 | Ht 74.0 in | Wt 310.4 lb

## 2022-09-03 DIAGNOSIS — I25118 Atherosclerotic heart disease of native coronary artery with other forms of angina pectoris: Secondary | ICD-10-CM | POA: Diagnosis not present

## 2022-09-03 DIAGNOSIS — I350 Nonrheumatic aortic (valve) stenosis: Secondary | ICD-10-CM | POA: Insufficient documentation

## 2022-09-03 LAB — CBC

## 2022-09-03 NOTE — Patient Instructions (Addendum)
Medication Instructions:  No changes today *If you need a refill on your cardiac medications before your next appointment, please call your pharmacy*   Lab Work: Today: BMET, CBC   Testing/Procedures: none   Follow-Up: After urology visit - see below

## 2022-09-03 NOTE — Progress Notes (Signed)
Structural Heart Clinic Note  Chief Complaint  Patient presents with   Follow-up    CAD, aortic stenosis    History of Present Illness: 77 yo male with history of chronic diastolic CHF, asthma, DM, HTN, hyperlipidemia, paroxysmal atrial fibrillation, prior CVA, nephrolithiasis, severe aortic stenosis and new finding of CAD who is here today in the structural heart clinic for follow up. I saw him as a new consult in  December 2023 to discuss his aortic stenosis and possible TAVR. He has atrial fibrillation and has undergone atrial fibrillation ablation in July 2021. He has been on Eliquis. He had a stroke in April of 2021. Echo 07/31/22 with LVEF=50-55%. Mild LVH. Severe aortic stenosis with mean gradient 51 mmHg, peak gradient 77 mmHg, AVA 0.48 cm2, DI 0.19, SVI 19.  Cardiac cath 08/29/22 with severe mid LAD stenosis. PCI was not performed given his chronic kidney disease. He is here today to discuss the staged PCI.   He continues to have chest pain and dyspnea with exertion. No dizziness or near syncope. He has had blood in his urine over the past few days. He had evidence of kidney stones on his CT in December 2023.   He lives alone in [place], [place]. He is a retired Counsellor. He has one daughter who lives in [place], [place]. He has no active dental issues. He sees a Pharmacist, community regularly.   Primary Care Physician: Corliss Parish, MD Primary Cardiologist: Harrell Gave Referring Cardiologist: Harrell Gave  Past Medical History:  Diagnosis Date   Allergic rhinitis, cause unspecified    Asthma    Atrial fibrillation ([place])    CAD (coronary artery disease)    Cataract cortical, senile, bilateral    Macular degeneration of left eye    Other and unspecified hyperlipidemia    Type II or unspecified type diabetes mellitus without mention of complication, not stated as uncontrolled    Unspecified essential hypertension     Past Surgical History:  Procedure Laterality Date   ATRIAL  FIBRILLATION ABLATION N/A 03/17/2020   Procedure: ATRIAL FIBRILLATION ABLATION;  Surgeon: Constance Haw, MD;  Location: [facility];  Service: Cardiovascular;  Laterality: N/A;   CARDIOVERSION N/A 09/22/2019   Procedure: CARDIOVERSION;  Surgeon: Donato Heinz, MD;  Location: [facility];  Service: Endoscopy;  Laterality: N/A;   CARDIOVERSION N/A 10/18/2019   Procedure: CARDIOVERSION;  Surgeon: Jerline Pain, MD;  Location: [facility] ENDOS[facility];  Service: Cardiovascular;  Laterality: N/A;   CATARACT EXTRACTION, BILATERAL  08/2020   RIGHT/LEFT HEART CATH AND CORONARY ANGIOGRAPHY N/A 08/29/2022   Procedure: RIGHT/LEFT HEART CATH AND CORONARY ANGIOGRAPHY;  Surgeon: Burnell Blanks, MD;  Location: [facility];  Service: Cardiovascular;  Laterality: N/A;   TEE WITHOUT CARDIOVERSION N/A 09/22/2019   Procedure: TRANSESOPHAGEAL ECHOCARDIOGRAM (TEE);  Surgeon: Donato Heinz, MD;  Location: [facility] ENDOSCOPY;  Service: Endoscopy;  Laterality: N/A;   TONSILLECTOMY      Current Outpatient Medications  Medication Sig Dispense Refill   aspirin EC 81 MG EC tablet Take 1 tablet (81 mg total) by mouth daily. 60 tablet 0   atorvastatin (LIPITOR) 80 MG tablet Take 1 tablet (80 mg total) by mouth every evening. 30 tablet 0   B-D ULTRAFINE III SHORT PEN 31G X 8 MM MISC SMARTSIG:Pre-Filled Pen Syringe SUB-Q 3 Times Daily     Cholecalciferol (VITAMIN D3) 50 MCG (2000 UT) TABS Take 2,000 Units by mouth daily.     ELIQUIS 5 MG TABS tablet TAKE 1 TABLET(5  MG) BY MOUTH TWICE DAILY 180 tablet 2   fluticasone-salmeterol (ADVAIR) 250-50 MCG/ACT AEPB INHALE 1 PUFF INTO THE LUNGS IN THE MORNING AND AT BEDTIME 60 each 11   furosemide (LASIX) 40 MG tablet Take 1 tablet (40 mg total) by mouth daily. 90 tablet 3   levalbuterol (XOPENEX HFA) 45 MCG/ACT inhaler Inhale 2 puffs into the lungs every 6 (six) hours as needed for wheezing or shortness of breath.     metoprolol succinate (TOPROL-XL)  50 MG 24 hr tablet Take 1 tablet (50 mg total) by mouth daily. Take with or immediately following a meal. 90 tablet 3   Multiple Vitamin (MULTIVITAMIN WITH MINERALS) TABS tablet Take 1 tablet by mouth daily.     MYRBETRIQ 25 MG TB24 tablet Take 25 mg by mouth daily.     NOVOLOG MIX 70/30 FLEXPEN (70-30) 100 UNIT/ML FlexPen Inject 20-40 Units into the skin in the morning and at bedtime. Sliding Scale     ondansetron (ZOFRAN-ODT) 4 MG disintegrating tablet Take 1 tablet (4 mg total) by mouth every 8 (eight) hours as needed for nausea or vomiting. 8 tablet 0   ONETOUCH VERIO test strip 1 each by Other route in the morning, at noon, and at bedtime.      oxyCODONE-acetaminophen (PERCOCET/ROXICET) 5-325 MG tablet Take 1-2 tablets by mouth every 6 (six) hours as needed for severe pain. 8 tablet 0   polyethylene glycol (MIRALAX / GLYCOLAX) 17 g packet Take 17 g by mouth daily. 14 each 0   PREVIDENT 5000 BOOSTER PLUS 1.1 % PSTE Place 1 application onto teeth in the morning and at bedtime.      sertraline (ZOLOFT) 100 MG tablet Take 100 mg by mouth daily.     No current facility-administered medications for this visit.    No Known Allergies  Social History   Socioeconomic History   Marital status: Divorced    Spouse name: Not on file   Number of children: 1   Years of education: Not on file   Highest education level: Professional school degree (e.g., MD, DDS, DVM, JD)  Occupational History   Occupation: Defense attorney  Tobacco Use   Smoking status: Never   Smokeless tobacco: Never  Vaping Use   Vaping Use: Never used  Substance and Sexual Activity   Alcohol use: Not Currently    Comment: Used to drink 2 L bottle of wine/day. Quit 09/18/19   Drug use: Never   Sexual activity: Not Currently  Other Topics Concern   Not on file  Social History Narrative   Not on file   Social Determinants of Health   Financial Resource Strain: Not on file  Food Insecurity: No Food Insecurity  (09/27/2019)   Hunger Vital Sign    Worried About Running Out of Food in the Last Year: Never true    Ran Out of Food in the Last Year: Never true  Transportation Needs: No Transportation Needs (09/27/2019)   PRAPARE - Administrator, Civil Service (Medical): No    Lack of Transportation (Non-Medical): No  Physical Activity: Not on file  Stress: Not on file  Social Connections: Not on file  Intimate Partner Violence: Not on file    Family History  Problem Relation Age of Onset   Colon cancer Father    Heart disease Paternal Grandfather     Review of Systems:  As stated in the HPI and otherwise negative.   BP 90/60   Pulse (!) 114  Ht 6\' 2"  (1.88 m)   Wt (!) 140.8 kg   SpO2 98%   BMI 39.85 kg/m   Physical Examination: General: Well developed, well nourished, NAD  HEENT: OP clear, mucus membranes moist  SKIN: warm, dry. No rashes. Neuro: No focal deficits  Musculoskeletal: Muscle strength 5/5 all ext  Psychiatric: Mood and affect normal  Neck: No JVD, no carotid bruits, no thyromegaly, no lymphadenopathy.  Lungs:Clear bilaterally, no wheezes, rhonci, crackles Cardiovascular: Regular rate and rhythm. Harsh systolic murmur.  Abdomen:Soft. Bowel sounds present. Non-tender.  Extremities: No lower extremity edema. Pulses are 2 + in the bilateral DP/PT.  EKG:  EKG is not  ordered today. The ekg ordered today demonstrates   Echo 07/31/22:  1. Left ventricular ejection fraction, by estimation, is 50 to 55%. The  left ventricle has low normal function. The left ventricle has no regional  wall motion abnormalities. There is mild left ventricular hypertrophy.  Left ventricular diastolic  parameters are consistent with Grade III diastolic dysfunction  (restrictive).   2. Right ventricular systolic function is normal. The right ventricular  size is normal. Tricuspid regurgitation signal is inadequate for assessing  PA pressure.   3. The mitral valve is degenerative.  No evidence of mitral valve  regurgitation. Moderate mitral annular calcification.   4. The aortic valve is calcified. Aortic valve regurgitation is mild.  Severe aortic valve stenosis.   5. There is borderline dilatation of the ascending aorta, measuring 39  mm.   6. The inferior vena cava is normal in size with greater than 50%  respiratory variability, suggesting right atrial pressure of 3 mmHg.   Conclusion(s)/Recommendation(s): Compared to prior TTE, AS gradient has  increased, it is now severe.   FINDINGS   Left Ventricle: Left ventricular ejection fraction, by estimation, is 50  to 55%. The left ventricle has low normal function. The left ventricle has  no regional wall motion abnormalities. Definity contrast agent was given  IV to delineate the left  ventricular endocardial borders. The left ventricular internal cavity size  was normal in size. There is mild left ventricular hypertrophy. Left  ventricular diastolic parameters are consistent with Grade III diastolic  dysfunction (restrictive).   Right Ventricle: The right ventricular size is normal. Right ventricular  systolic function is normal. Tricuspid regurgitation signal is inadequate  for assessing PA pressure.   Left Atrium: Left atrial size was normal in size.   Right Atrium: Right atrial size was normal in size.   Pericardium: There is no evidence of pericardial effusion.   Mitral Valve: The mitral valve is degenerative in appearance. There is  moderate thickening of the mitral valve leaflet(s). Moderate mitral  annular calcification. No evidence of mitral valve regurgitation. The mean  mitral valve gradient is 11.0 mmHg.   Tricuspid Valve: Tricuspid valve regurgitation is not demonstrated.   Aortic Valve: The aortic valve is calcified. Aortic valve regurgitation is  mild. Severe aortic stenosis is present. Aortic valve mean gradient  measures 51.0 mmHg. Aortic valve peak gradient measures 77.1 mmHg. Aortic   valve area, by VTI measures 0.59 cm.   Pulmonic Valve: Pulmonic valve regurgitation is not visualized.   Aorta: There is borderline dilatation of the ascending aorta, measuring 39  mm.   Venous: The inferior vena cava is normal in size with greater than 50%  respiratory variability, suggesting right atrial pressure of 3 mmHg.   IAS/Shunts: No atrial level shunt detected by color flow Doppler.     LEFT VENTRICLE  PLAX 2D  LVIDd:         3.90 cm   Diastology  LVIDs:         2.89 cm   LV e' medial:    8.27 cm/s  LV PW:         2.10 cm   LV E/e' medial:  20.2  LV IVS:        1.89 cm   LV e' lateral:   11.00 cm/s  LVOT diam:     2.00 cm   LV E/e' lateral: 15.2  LV SV:         50  LV SV Index:   19  LVOT Area:     3.14 cm     RIGHT VENTRICLE  RV Basal diam:  3.78 cm  RV Mid diam:    3.14 cm  RV S prime:     12.90 cm/s  TAPSE (M-mode): 1.8 cm   LEFT ATRIUM             Index        RIGHT ATRIUM           Index  LA diam:        4.00 cm 1.55 cm/m   RA Area:     24.90 cm  LA Vol (A2C):   59.5 ml 23.04 ml/m  RA Volume:   78.10 ml  30.25 ml/m  LA Vol (A4C):   45.4 ml 17.58 ml/m  LA Biplane Vol: 52.1 ml 20.18 ml/m   AORTIC VALVE  AV Area (Vmax):    0.58 cm  AV Area (Vmean):   0.48 cm  AV Area (VTI):     0.59 cm  AV Vmax:           439.00 cm/s  AV Vmean:          340.000 cm/s  AV VTI:            0.839 m  AV Peak Grad:      77.1 mmHg  AV Mean Grad:      51.0 mmHg  LVOT Vmax:         81.30 cm/s  LVOT Vmean:        52.200 cm/s  LVOT VTI:          0.158 m  LVOT/AV VTI ratio: 0.19    AORTA  Ao Root diam: 2.90 cm  Ao Asc diam:  3.90 cm   MITRAL VALVE  MV Area (PHT): 3.12 cm     SHUNTS  MV Mean grad:  11.0 mmHg    Systemic VTI:  0.16 m  MV Decel Time: 243 msec     Systemic Diam: 2.00 cm  MV E velocity: 167.00 cm/s  MV A velocity: 68.70 cm/s  MV E/A ratio:  2.43   Cardiac cath 08/29/22:    Prox RCA to Mid RCA lesion is 30% stenosed.   Mid LAD lesion is 80%  stenosed.   The LAD and Circumflex arise from different ostia.  Severe mid LAD stenosis The Circumflex is a large co-dominant vessel with no obvious disease based on non-selective angiography.  (Unable to selectively engage the Circumflex despite the use of multiple catheters).  The RCA is a moderate caliber co-dominant vessel with mild mid vessel stenosis.  Severe aortic stenosis (mean gradient 45.5 mmHg, peak to peak gradient 62.8 mmHg, AVA 0.65 cm2).    Recommendations: Will plan to stage PCI of the LAD. I will see him back in the office  1-2 weeks from now to recheck his renal function and review the PCI in detail. Will continue workup for TAVR after his PCI of the LAD. I will load his anti-platelet agent when he is seen in the office.    Indications  Severe aortic stenosis [I35.0 (ICD-10-CM)]  Coronary artery disease involving native coronary artery of native heart without angina pectoris [I25.10 (ICD-10-CM)]   Procedural Details  Technical Details Indication: 77 yo male with history of chronic diastolic CHF, asthma, DM, HTN, hyperlipidemia, paroxysmal atrial fibrillation, prior CVA and severe aortic stenosis who is undergoing workup for TAVR.   Procedure: The risks, benefits, complications, treatment options, and expected outcomes were discussed with the patient. The patient and/or family concurred with the proposed plan, giving informed consent. The patient was sedated with Versed and Fentanyl. The IV catheter in the right wrist was changed for a 5 French sheath. Right heart catheterization performed with a balloon tipped catheter. The right wrist was prepped and draped in a sterile fashion. 1% lidocaine was used for local anesthesia. Using the modified Seldinger access technique, a 5 French sheath was placed in the right radial artery using u/s guidance. 3 mg Verapamil was given through the sheath. Weight based IV heparin was given. Standard diagnostic catheters were used to perform  selective coronary angiography. LV pressures measured with the JR4 catheter. All catheter exchanges were performed over an exchange length guidewire.   The sheath was removed from the right radial artery and a hemostasis band was applied at the arteriotomy site on the right wrist.      Estimated blood loss <50 mL.   During this procedure medications were administered to achieve and maintain moderate conscious sedation while the patient's heart rate, blood pressure, and oxygen saturation were continuously monitored and I was present face-to-face 100% of this time.   Medications (Filter: Administrations occurring from 1027 to 1140 on 08/29/22)  important  Continuous medications are totaled by the amount administered until 08/29/22 1140.   fentaNYL (SUBLIMAZE) injection (mcg)  Total dose: 50 mcg Date/Time Rate/Dose/Volume Action   08/29/22 1038 50 mcg Given   midazolam (VERSED) injection (mg)  Total dose: 2 mg Date/Time Rate/Dose/Volume Action   08/29/22 1038 2 mg Given   Heparin (Porcine) in NaCl 1000-0.9 UT/500ML-% SOLN (mL)  Total volume: 1,000 mL Date/Time Rate/Dose/Volume Action   08/29/22 1050 500 mL Given   1050 500 mL Given   lidocaine (PF) (XYLOCAINE) 1 % injection (mL)  Total volume: 4 mL Date/Time Rate/Dose/Volume Action   08/29/22 1052 2 mL Given   1055 2 mL Given   Radial Cocktail/Verapamil only (mL)  Total volume: 10 mL Date/Time Rate/Dose/Volume Action   08/29/22 1057 10 mL Given   heparin sodium (porcine) injection (Units)  Total dose: 6,000 Units Date/Time Rate/Dose/Volume Action   08/29/22 1104 6,000 Units Given    Sedation Time  Sedation Time Physician-1: 49 minutes 24 seconds Radiation/Fluoro  Fluoro time: 14.9 (min) DAP: 90.6 (Gycm2) Cumulative Air Kerma: 1898 (mGy) Complications  Complications documented before study signed (08/29/2022 11:44 AM)   No complications were associated with this study.  Documented by Earlean Polka, RT - 08/29/2022  11:41 AM     Coronary Findings  Diagnostic Dominance: Co-dominant Left Anterior Descending  Vessel is large.  Mid LAD lesion is 80% stenosed.    First Diagonal Branch  Vessel is moderate in size.    Second Diagonal Branch  Vessel is small in size.    Left Circumflex  Vessel is large.  Right Coronary Artery  Prox RCA to Mid RCA lesion is 30% stenosed.    Intervention   No interventions have been documented.   Coronary Diagrams  Diagnostic Dominance: Co-dominant  Intervention   Implants   No implant documentation for this case.   Syngo Images   Show images for CARDIAC CATHETERIZATION Images on Long Term Storage   Show images for Luster, Hechler to Procedure Log  Procedure Log    Hemo Data  Flowsheet Row Most Recent Value  Fick Cardiac Output 6.17 L/min  Fick Cardiac Output Index 2.39 (L/min)/BSA  Aortic Mean Gradient 45.54 mmHg  Aortic Peak Gradient 62.8 mmHg  Aortic Valve Area 0.65  Aortic Value Area Index 0.25 cm2/BSA  RA A Wave 11 mmHg  RA V Wave 13 mmHg  RA Mean 11 mmHg  RV Systolic Pressure 40 mmHg  RV Diastolic Pressure 3 mmHg  RV EDP 7 mmHg  PA Systolic Pressure 41 mmHg  PA Diastolic Pressure 25 mmHg  PA Mean 34 mmHg  PW A Wave 20 mmHg  PW V Wave 19 mmHg  PW Mean 23 mmHg  AO Systolic Pressure 010 mmHg  AO Diastolic Pressure 75 mmHg  AO Mean 89 mmHg  LV Systolic Pressure 932 mmHg  LV Diastolic Pressure 4 mmHg  LV EDP 16 mmHg  AOp Systolic Pressure 87 mmHg  AOp Diastolic Pressure 61 mmHg  AOp Mean Pressure 72 mmHg  LVp Systolic Pressure 355 mmHg  LVp Diastolic Pressure 7 mmHg  LVp EDP Pressure 7 mmHg  QP/QS 1  TPVR Index 14.23 HRUI  TSVR Index 33.48 HRUI  PVR SVR Ratio 0.16  TPVR/TSVR Ratio 0.42    Recent Labs: 07/29/2022: ALT 19; Magnesium 1.9 08/26/2022: Platelets 294 08/29/2022: BUN 29; Creatinine, Ser 1.99; Hemoglobin 10.5; Potassium 3.8; Sodium 143   Lipid Panel    Component Value Date/Time   CHOL 96  12/04/2019 0503   TRIG 59 12/04/2019 0503   HDL 32 (L) 12/04/2019 0503   CHOLHDL 3.0 12/04/2019 0503   VLDL 12 12/04/2019 0503   LDLCALC 52 12/04/2019 0503     Wt Readings from Last 3 Encounters:  09/03/22 (!) 140.8 kg  08/29/22 136.1 kg  08/09/22 (!) 145 kg    Assessment and Plan:   1. Severe Aortic Valve Stenosis: Severe stage D1 aortic valve stenosis. NYHA class 3. I have personally reviewed the echo images. The aortic valve is thickened, calcified with limited leaflet mobility. I think he would benefit from AVR. Given advanced age, he is not a good candidate for conventional AVR by surgical approach. I think he may be a good candidate for TAVR. Following his staged PCI of the LAD, we will proceed with pre-TAVR CT scans and then have him see one of the CT surgeons on our TAVR team.   I have reviewed the natural history of aortic stenosis with the patient and their family members  who are present today. We have discussed the limitations of medical therapy and the poor prognosis associated with symptomatic aortic stenosis. We have reviewed potential treatment options, including palliative medical therapy, conventional surgical aortic valve replacement, and transcatheter aortic valve replacement. We discussed treatment options in the context of the patient's specific comorbid medical conditions.   2. CAD with angina: He has ongoing chest pain with exertion. Cardiac cath 08/29/22 with severe mid LAD stenosis. Will plan staged PCI of the mid LAD when he has been cleared from a urological standpoint. I have reviewed the risks, indications, and alternatives  to cardiac catheterization, possible angioplasty, and stenting with the patient. Risks include but are not limited to bleeding, infection, vascular injury, stroke, myocardial infection, arrhythmia, kidney injury, radiation-related injury in the case of prolonged fluoroscopy use, emergency cardiac surgery, and death. The patient understands the risks  of serious complication is 1-2 in 1000 with diagnostic cardiac cath and 1-2% or less with angioplasty/stenting. -Will continue ASA, statin and beta blocker.  -I will see him back in 2 weeks to arrange his PCi -BMET and CBC today  Labs/ tests ordered today include:   Orders Placed This Encounter  Procedures   Basic Metabolic Panel (BMET)   CBC   Disposition:   F/U with me in 2 weeks.   Signed, Verne Carrow, MD, [facility] 09/03/2022 9:39 AM    [facility] [address] [place], [place], [place]  [address] Phone: [phone]; Fax: [phone]

## 2022-09-04 LAB — BASIC METABOLIC PANEL
BUN/Creatinine Ratio: 15 (ref 10–24)
BUN: 29 mg/dL — ABNORMAL HIGH (ref 8–27)
CO2: 22 mmol/L (ref 20–29)
Calcium: 9.4 mg/dL (ref 8.6–10.2)
Chloride: 104 mmol/L (ref 96–106)
Creatinine, Ser: 1.97 mg/dL — ABNORMAL HIGH (ref 0.76–1.27)
Glucose: 189 mg/dL — ABNORMAL HIGH (ref 70–99)
Potassium: 4.2 mmol/L (ref 3.5–5.2)
Sodium: 143 mmol/L (ref 134–144)
eGFR: 35 mL/min/{1.73_m2} — ABNORMAL LOW (ref >59)

## 2022-09-04 LAB — CBC
Hematocrit: 37.6 % (ref 37.5–51.0)
Hemoglobin: 12.4 g/dL — ABNORMAL LOW (ref 13.0–17.7)
MCH: 29.7 pg (ref 26.6–33.0)
MCHC: 33 g/dL (ref 31.5–35.7)
MCV: 90 fL (ref 79–97)
Platelets: 287 10*3/uL (ref 150–450)
RBC: 4.17 x10E6/uL (ref 4.14–5.80)
RDW: 13 % (ref 11.6–15.4)
WBC: 11.8 10*3/uL — ABNORMAL HIGH (ref 3.4–10.8)

## 2022-09-05 ENCOUNTER — Encounter: Payer: Self-pay | Admitting: Cardiovascular Disease

## 2022-09-05 ENCOUNTER — Encounter (HOSPITAL_BASED_OUTPATIENT_CLINIC_OR_DEPARTMENT_OTHER): Payer: Self-pay

## 2022-09-05 DIAGNOSIS — R000 Tachycardia, unspecified: Secondary | ICD-10-CM | POA: Diagnosis not present

## 2022-09-11 DIAGNOSIS — Z125 Encounter for screening for malignant neoplasm of prostate: Secondary | ICD-10-CM | POA: Diagnosis not present

## 2022-09-11 DIAGNOSIS — N200 Calculus of kidney: Secondary | ICD-10-CM | POA: Diagnosis not present

## 2022-09-14 DIAGNOSIS — R000 Tachycardia, unspecified: Secondary | ICD-10-CM | POA: Diagnosis not present

## 2022-09-25 ENCOUNTER — Encounter: Payer: Self-pay | Admitting: Cardiovascular Disease

## 2022-09-25 ENCOUNTER — Ambulatory Visit: Payer: Medicare Other | Attending: Cardiovascular Disease | Admitting: Cardiovascular Disease

## 2022-09-25 VITALS — BP 88/60 | HR 72 | Ht 74.0 in | Wt 304.4 lb

## 2022-09-25 DIAGNOSIS — I25110 Atherosclerotic heart disease of native coronary artery with unstable angina pectoris: Secondary | ICD-10-CM | POA: Diagnosis not present

## 2022-09-25 DIAGNOSIS — Z01812 Encounter for preprocedural laboratory examination: Secondary | ICD-10-CM | POA: Diagnosis not present

## 2022-09-25 DIAGNOSIS — I350 Nonrheumatic aortic (valve) stenosis: Secondary | ICD-10-CM | POA: Diagnosis not present

## 2022-09-25 MED ORDER — CLOPIDOGREL BISULFATE 75 MG PO TABS: ORAL_TABLET | ORAL | 3 refills | Status: DC

## 2022-09-25 NOTE — Patient Instructions (Signed)
Medication Instructions:  Your physician has recommended you make the following change in your medication:  1.) start clopidogrel (Plavix) 75 mg On day one take FOUR tablets (300 mg), then after that, take ONE tablet daily.   *If you need a refill on your cardiac medications before your next appointment, please call your pharmacy*   Lab Work: Today: cbc, bmet   Testing/Procedures: Your physician has requested that you have a cardiac catheterization. Cardiac catheterization is used to diagnose and/or treat various heart conditions. Doctors may recommend this procedure for a number of different reasons. The most common reason is to evaluate chest pain. Chest pain can be a symptom of coronary artery disease (CAD), and cardiac catheterization can show whether plaque is narrowing or blocking your heart's arteries. This procedure is also used to evaluate the valves, as well as measure the blood flow and oxygen levels in different parts of your heart. For further information please visit [email or web address]. Please follow instruction sheet, as given.   Follow-Up: At [facility], you and your health needs are our priority.  As part of our continuing mission to provide you with exceptional heart care, we have created designated Provider Care Teams.  These Care Teams include your primary Cardiologist (physician) and Advanced Practice Providers (APPs -  Physician Assistants and Nurse Practitioners) who all work together to provide you with the care you need, when you need it.   Your next appointment:   3 week(s)  Provider:   Lauree Chandler, MD         Cardiac/Peripheral Catheterization   You are scheduled for a Cardiac Catheterization on Friday, February 16 with Dr. Lauree Chandler.  1. Please arrive at the Main Entrance A at [facility]: [place], [place] [address] on February 16 at 8:30 AM. This time is four hours before your procedure to ensure  time for IV hydration.        Free valet parking service is available. You will check in at ADMITTING. The support person will be asked to wait in the waiting room.  It is OK to have someone drop you off and come back when you are ready to be discharged.        Special note: Every effort is made to have your procedure done on time. Please understand that emergencies sometimes delay scheduled procedures.   . 2. Diet: Do not eat solid foods after midnight.  You may have clear liquids until 5 AM the day of the procedure.  3. Labs: You will need to have blood drawn today.  4. Medication instructions in preparation for your procedure:   Contrast Allergy: No   Do not take Lasix or any insulin on the morning of your procedure.   Please do not take Eliqius on Feb 14, 15, or 16.  TAKE HALF DOSE OF THE USUAL INSULIN THAT YOU NORMALLY TAKE AT BEDTIME  On the morning of your procedure, take Plavix/Clopidogrel and any morning medicines NOT listed above.  You may use sips of water.  5. Plan to go home the same day, you will only stay overnight if medically necessary. 6. You MUST have a responsible adult to drive you home. 7. An adult MUST be with you the first 24 hours after you arrive home. 8. Bring a current list of your medications, and the last time and date medication taken. 9. Bring ID and current insurance cards. 10.Please wear clothes that are easy to get on and off and wear  slip-on shoes.  Thank you for allowing [place] to care for you!   -- [facility]

## 2022-09-25 NOTE — H&P (View-Only) (Signed)
Structural Heart Clinic Note  Chief Complaint  Patient presents with   Follow-up    CAD, Aortic stenosis    History of Present Illness: 77 yo male with history of chronic diastolic CHF, asthma, DM, HTN, hyperlipidemia, paroxysmal atrial fibrillation, prior CVA, nephrolithiasis, severe aortic stenosis and new finding of CAD who is here today in the structural heart clinic for follow up. I saw him as a new consult in  December 2023 to discuss his aortic stenosis and possible TAVR. He has atrial fibrillation and has undergone atrial fibrillation ablation in July 2021. He has been on Eliquis. He had a stroke in April of 2021. Echo 07/31/22 with LVEF=50-55%. Mild LVH. Severe aortic stenosis with mean gradient 51 mmHg, peak gradient 77 mmHg, AVA 0.48 cm2, DI 0.19, SVI 19.  Cardiac cath 08/29/22 with severe mid LAD stenosis. PCI was not performed given his chronic kidney disease.  I saw him on 09/03/22 to discuss staged PCI but he was having blood in his urine. He has since followed up with Urology and this was felt to be due to his kidney stones.   He continues to have dyspnea and chest pain. No dizziness or near syncope.  His urine has cleared.   He lives alone in [place], [place]. He is a retired Counsellor. He has one daughter who lives in [place], [place]. He has no active dental issues. He sees a Pharmacist, community regularly.   Primary Care Physician: Corliss Parish, MD Primary Cardiologist: Harrell Gave Referring Cardiologist: Harrell Gave  Past Medical History:  Diagnosis Date   Allergic rhinitis, cause unspecified    Asthma    Atrial fibrillation ([place])    CAD (coronary artery disease)    Cataract cortical, senile, bilateral    Macular degeneration of left eye    Other and unspecified hyperlipidemia    Type II or unspecified type diabetes mellitus without mention of complication, not stated as uncontrolled    Unspecified essential hypertension     Past Surgical History:  Procedure  Laterality Date   ATRIAL FIBRILLATION ABLATION N/A 03/17/2020   Procedure: ATRIAL FIBRILLATION ABLATION;  Surgeon: Constance Haw, MD;  Location: [facility];  Service: Cardiovascular;  Laterality: N/A;   CARDIOVERSION N/A 09/22/2019   Procedure: CARDIOVERSION;  Surgeon: Donato Heinz, MD;  Location: [facility];  Service: Endoscopy;  Laterality: N/A;   CARDIOVERSION N/A 10/18/2019   Procedure: CARDIOVERSION;  Surgeon: Jerline Pain, MD;  Location: [facility] ENDOSCOPY;  Service: Cardiovascular;  Laterality: N/A;   CATARACT EXTRACTION, BILATERAL  08/2020   RIGHT/LEFT HEART CATH AND CORONARY ANGIOGRAPHY N/A 08/29/2022   Procedure: RIGHT/LEFT HEART CATH AND CORONARY ANGIOGRAPHY;  Surgeon: Burnell Blanks, MD;  Location: [facility];  Service: Cardiovascular;  Laterality: N/A;   TEE WITHOUT CARDIOVERSION N/A 09/22/2019   Procedure: TRANSESOPHAGEAL ECHOCARDIOGRAM (TEE);  Surgeon: Donato Heinz, MD;  Location: [facility] ENDOSCOPY;  Service: Endoscopy;  Laterality: N/A;   TONSILLECTOMY      Current Outpatient Medications  Medication Sig Dispense Refill   aspirin EC 81 MG EC tablet Take 1 tablet (81 mg total) by mouth daily. 60 tablet 0   atorvastatin (LIPITOR) 80 MG tablet Take 1 tablet (80 mg total) by mouth every evening. 30 tablet 0   B-D ULTRAFINE III SHORT PEN 31G X 8 MM MISC SMARTSIG:Pre-Filled Pen Syringe SUB-Q 3 Times Daily     Cholecalciferol (VITAMIN D3) 50 MCG (2000 UT) TABS Take 2,000 Units by mouth daily.     clopidogrel (PLAVIX)  75 MG tablet Take four tablets (300 mg) by mouth on DAY 1, then take 1 tablet daily 94 tablet 3   ELIQUIS 5 MG TABS tablet TAKE 1 TABLET(5 MG) BY MOUTH TWICE DAILY 180 tablet 2   fluticasone-salmeterol (ADVAIR) 250-50 MCG/ACT AEPB INHALE 1 PUFF INTO THE LUNGS IN THE MORNING AND AT BEDTIME 60 each 11   furosemide (LASIX) 40 MG tablet Take 1 tablet (40 mg total) by mouth daily. 90 tablet 3   levalbuterol (XOPENEX HFA) 45  MCG/ACT inhaler Inhale 2 puffs into the lungs every 6 (six) hours as needed for wheezing or shortness of breath.     metoprolol succinate (TOPROL-XL) 50 MG 24 hr tablet Take 1 tablet (50 mg total) by mouth daily. Take with or immediately following a meal. 90 tablet 3   Multiple Vitamin (MULTIVITAMIN WITH MINERALS) TABS tablet Take 1 tablet by mouth daily.     MYRBETRIQ 25 MG TB24 tablet Take 25 mg by mouth daily.     NOVOLOG MIX 70/30 FLEXPEN (70-30) 100 UNIT/ML FlexPen Inject 20-40 Units into the skin in the morning and at bedtime. Sliding Scale     ONETOUCH VERIO test strip 1 each by Other route in the morning, at noon, and at bedtime.      polyethylene glycol (MIRALAX / GLYCOLAX) 17 g packet Take 17 g by mouth daily. 14 each 0   PREVIDENT 5000 BOOSTER PLUS 1.1 % PSTE Place 1 application onto teeth in the morning and at bedtime.      sertraline (ZOLOFT) 100 MG tablet Take 100 mg by mouth daily.     No current facility-administered medications for this visit.    No Known Allergies  Social History   Socioeconomic History   Marital status: Divorced    Spouse name: Not on file   Number of children: 1   Years of education: Not on file   Highest education level: Professional school degree (e.g., MD, DDS, DVM, JD)  Occupational History   Occupation: Defense attorney  Tobacco Use   Smoking status: Never   Smokeless tobacco: Never  Vaping Use   Vaping Use: Never used  Substance and Sexual Activity   Alcohol use: Not Currently    Comment: Used to drink 2 L bottle of wine/day. Quit 09/18/19   Drug use: Never   Sexual activity: Not Currently  Other Topics Concern   Not on file  Social History Narrative   Not on file   Social Determinants of Health   Financial Resource Strain: Not on file  Food Insecurity: No Food Insecurity (09/27/2019)   Hunger Vital Sign    Worried About Running Out of Food in the Last Year: Never true    Ran Out of Food in the Last Year: Never true   Transportation Needs: No Transportation Needs (09/27/2019)   PRAPARE - Hydrologist (Medical): No    Lack of Transportation (Non-Medical): No  Physical Activity: Not on file  Stress: Not on file  Social Connections: Not on file  Intimate Partner Violence: Not on file    Family History  Problem Relation Age of Onset   Colon cancer Father    Heart disease Paternal Grandfather     Review of Systems:  As stated in the HPI and otherwise negative.   BP (!) 88/60   Pulse 72   Ht 6' 2"$  (1.88 m)   Wt (!) 138.1 kg   SpO2 98%   BMI 39.08 kg/m  Physical Examination: General: Well developed, well nourished, NAD  HEENT: OP clear, mucus membranes moist  SKIN: warm, dry. No rashes. Neuro: No focal deficits  Musculoskeletal: Muscle strength 5/5 all ext  Psychiatric: Mood and affect normal  Neck: No JVD, no carotid bruits, no thyromegaly, no lymphadenopathy.  Lungs:Clear bilaterally, no wheezes, rhonci, crackles Cardiovascular: Regular rate and rhythm. No murmurs, gallops or rubs. Abdomen:Soft. Bowel sounds present. Non-tender.  Extremities: No lower extremity edema. Pulses are 2 + in the bilateral DP/PT.  EKG:  EKG is ordered today. The ekg ordered today demonstrates NSR, incomplete RBBB. LAFB  Echo 07/31/22:  1. Left ventricular ejection fraction, by estimation, is 50 to 55%. The  left ventricle has low normal function. The left ventricle has no regional  wall motion abnormalities. There is mild left ventricular hypertrophy.  Left ventricular diastolic  parameters are consistent with Grade III diastolic dysfunction  (restrictive).   2. Right ventricular systolic function is normal. The right ventricular  size is normal. Tricuspid regurgitation signal is inadequate for assessing  PA pressure.   3. The mitral valve is degenerative. No evidence of mitral valve  regurgitation. Moderate mitral annular calcification.   4. The aortic valve is calcified.  Aortic valve regurgitation is mild.  Severe aortic valve stenosis.   5. There is borderline dilatation of the ascending aorta, measuring 39  mm.   6. The inferior vena cava is normal in size with greater than 50%  respiratory variability, suggesting right atrial pressure of 3 mmHg.   Conclusion(s)/Recommendation(s): Compared to prior TTE, AS gradient has  increased, it is now severe.   FINDINGS   Left Ventricle: Left ventricular ejection fraction, by estimation, is 50  to 55%. The left ventricle has low normal function. The left ventricle has  no regional wall motion abnormalities. Definity contrast agent was given  IV to delineate the left  ventricular endocardial borders. The left ventricular internal cavity size  was normal in size. There is mild left ventricular hypertrophy. Left  ventricular diastolic parameters are consistent with Grade III diastolic  dysfunction (restrictive).   Right Ventricle: The right ventricular size is normal. Right ventricular  systolic function is normal. Tricuspid regurgitation signal is inadequate  for assessing PA pressure.   Left Atrium: Left atrial size was normal in size.   Right Atrium: Right atrial size was normal in size.   Pericardium: There is no evidence of pericardial effusion.   Mitral Valve: The mitral valve is degenerative in appearance. There is  moderate thickening of the mitral valve leaflet(s). Moderate mitral  annular calcification. No evidence of mitral valve regurgitation. The mean  mitral valve gradient is 11.0 mmHg.   Tricuspid Valve: Tricuspid valve regurgitation is not demonstrated.   Aortic Valve: The aortic valve is calcified. Aortic valve regurgitation is  mild. Severe aortic stenosis is present. Aortic valve mean gradient  measures 51.0 mmHg. Aortic valve peak gradient measures 77.1 mmHg. Aortic  valve area, by VTI measures 0.59 cm.   Pulmonic Valve: Pulmonic valve regurgitation is not visualized.   Aorta:  There is borderline dilatation of the ascending aorta, measuring 39  mm.   Venous: The inferior vena cava is normal in size with greater than 50%  respiratory variability, suggesting right atrial pressure of 3 mmHg.   IAS/Shunts: No atrial level shunt detected by color flow Doppler.     LEFT VENTRICLE  PLAX 2D  LVIDd:         3.90 cm   Diastology  LVIDs:  2.89 cm   LV e' medial:    8.27 cm/s  LV PW:         2.10 cm   LV E/e' medial:  20.2  LV IVS:        1.89 cm   LV e' lateral:   11.00 cm/s  LVOT diam:     2.00 cm   LV E/e' lateral: 15.2  LV SV:         50  LV SV Index:   19  LVOT Area:     3.14 cm     RIGHT VENTRICLE  RV Basal diam:  3.78 cm  RV Mid diam:    3.14 cm  RV S prime:     12.90 cm/s  TAPSE (M-mode): 1.8 cm   LEFT ATRIUM             Index        RIGHT ATRIUM           Index  LA diam:        4.00 cm 1.55 cm/m   RA Area:     24.90 cm  LA Vol (A2C):   59.5 ml 23.04 ml/m  RA Volume:   78.10 ml  30.25 ml/m  LA Vol (A4C):   45.4 ml 17.58 ml/m  LA Biplane Vol: 52.1 ml 20.18 ml/m   AORTIC VALVE  AV Area (Vmax):    0.58 cm  AV Area (Vmean):   0.48 cm  AV Area (VTI):     0.59 cm  AV Vmax:           439.00 cm/s  AV Vmean:          340.000 cm/s  AV VTI:            0.839 m  AV Peak Grad:      77.1 mmHg  AV Mean Grad:      51.0 mmHg  LVOT Vmax:         81.30 cm/s  LVOT Vmean:        52.200 cm/s  LVOT VTI:          0.158 m  LVOT/AV VTI ratio: 0.19    AORTA  Ao Root diam: 2.90 cm  Ao Asc diam:  3.90 cm   MITRAL VALVE  MV Area (PHT): 3.12 cm     SHUNTS  MV Mean grad:  11.0 mmHg    Systemic VTI:  0.16 m  MV Decel Time: 243 msec     Systemic Diam: 2.00 cm  MV E velocity: 167.00 cm/s  MV A velocity: 68.70 cm/s  MV E/A ratio:  2.43   Cardiac cath 08/29/22:    Prox RCA to Mid RCA lesion is 30% stenosed.   Mid LAD lesion is 80% stenosed.   The LAD and Circumflex arise from different ostia.  Severe mid LAD stenosis The Circumflex is a large  co-dominant vessel with no obvious disease based on non-selective angiography.  (Unable to selectively engage the Circumflex despite the use of multiple catheters).  The RCA is a moderate caliber co-dominant vessel with mild mid vessel stenosis.  Severe aortic stenosis (mean gradient 45.5 mmHg, peak to peak gradient 62.8 mmHg, AVA 0.65 cm2).    Recommendations: Will plan to stage PCI of the LAD. I will see him back in the office 1-2 weeks from now to recheck his renal function and review the PCI in detail. Will continue workup for TAVR after his PCI of the LAD. I  will load his anti-platelet agent when he is seen in the office.    Indications  Severe aortic stenosis [I35.0 (ICD-10-CM)]  Coronary artery disease involving native coronary artery of native heart without angina pectoris [I25.10 (ICD-10-CM)]   Procedural Details  Technical Details Indication: 77 yo male with history of chronic diastolic CHF, asthma, DM, HTN, hyperlipidemia, paroxysmal atrial fibrillation, prior CVA and severe aortic stenosis who is undergoing workup for TAVR.   Procedure: The risks, benefits, complications, treatment options, and expected outcomes were discussed with the patient. The patient and/or family concurred with the proposed plan, giving informed consent. The patient was sedated with Versed and Fentanyl. The IV catheter in the right wrist was changed for a 5 French sheath. Right heart catheterization performed with a balloon tipped catheter. The right wrist was prepped and draped in a sterile fashion. 1% lidocaine was used for local anesthesia. Using the modified Seldinger access technique, a 5 French sheath was placed in the right radial artery using u/s guidance. 3 mg Verapamil was given through the sheath. Weight based IV heparin was given. Standard diagnostic catheters were used to perform selective coronary angiography. LV pressures measured with the JR4 catheter. All catheter exchanges were performed over an  exchange length guidewire.   The sheath was removed from the right radial artery and a hemostasis band was applied at the arteriotomy site on the right wrist.      Estimated blood loss <50 mL.   During this procedure medications were administered to achieve and maintain moderate conscious sedation while the patient's heart rate, blood pressure, and oxygen saturation were continuously monitored and I was present face-to-face 100% of this time.   Medications (Filter: Administrations occurring from 1027 to 1140 on 08/29/22)  important  Continuous medications are totaled by the amount administered until 08/29/22 1140.   fentaNYL (SUBLIMAZE) injection (mcg)  Total dose: 50 mcg Date/Time Rate/Dose/Volume Action   08/29/22 1038 50 mcg Given   midazolam (VERSED) injection (mg)  Total dose: 2 mg Date/Time Rate/Dose/Volume Action   08/29/22 1038 2 mg Given   Heparin (Porcine) in NaCl 1000-0.9 UT/500ML-% SOLN (mL)  Total volume: 1,000 mL Date/Time Rate/Dose/Volume Action   08/29/22 1050 500 mL Given   1050 500 mL Given   lidocaine (PF) (XYLOCAINE) 1 % injection (mL)  Total volume: 4 mL Date/Time Rate/Dose/Volume Action   08/29/22 1052 2 mL Given   1055 2 mL Given   Radial Cocktail/Verapamil only (mL)  Total volume: 10 mL Date/Time Rate/Dose/Volume Action   08/29/22 1057 10 mL Given   heparin sodium (porcine) injection (Units)  Total dose: 6,000 Units Date/Time Rate/Dose/Volume Action   08/29/22 1104 6,000 Units Given    Sedation Time  Sedation Time Physician-1: 49 minutes 24 seconds Radiation/Fluoro  Fluoro time: 14.9 (min) DAP: 90.6 (Gycm2) Cumulative Air Kerma: [id number] (mGy) Complications  Complications documented before study signed (08/29/2022 [id number] AM)   No complications were associated with this study.  Documented by Biagio Quint, RT - 08/29/2022 11:41 AM     Coronary Findings  Diagnostic Dominance: Co-dominant Left Anterior Descending  Vessel is large.  Mid  LAD lesion is 80% stenosed.    First Diagonal Branch  Vessel is moderate in size.    Second Diagonal Branch  Vessel is small in size.    Left Circumflex  Vessel is large.    Right Coronary Artery  Prox RCA to Mid RCA lesion is 30% stenosed.    Intervention   No interventions have been documented.  Coronary Diagrams  Diagnostic Dominance: Co-dominant  Intervention   Implants   No implant documentation for this case.   Syngo Images   Show images for CARDIAC CATHETERIZATION Images on Long Term Storage   Show images for Jaret, Behn to Procedure Log  Procedure Log    Hemo Data  Flowsheet Row Most Recent Value  Fick Cardiac Output 6.17 L/min  Fick Cardiac Output Index 2.39 (L/min)/BSA  Aortic Mean Gradient 45.54 mmHg  Aortic Peak Gradient 62.8 mmHg  Aortic Valve Area 0.65  Aortic Value Area Index 0.25 cm2/BSA  RA A Wave 11 mmHg  RA V Wave 13 mmHg  RA Mean 11 mmHg  RV Systolic Pressure 40 mmHg  RV Diastolic Pressure 3 mmHg  RV EDP 7 mmHg  PA Systolic Pressure 41 mmHg  PA Diastolic Pressure 25 mmHg  PA Mean 34 mmHg  PW A Wave 20 mmHg  PW V Wave 19 mmHg  PW Mean 23 mmHg  AO Systolic Pressure [id number] mmHg  AO Diastolic Pressure 75 mmHg  AO Mean 89 mmHg  LV Systolic Pressure [id number] mmHg  LV Diastolic Pressure 4 mmHg  LV EDP 16 mmHg  AOp Systolic Pressure 87 mmHg  AOp Diastolic Pressure 61 mmHg  AOp Mean Pressure 72 mmHg  LVp Systolic Pressure [id number] mmHg  LVp Diastolic Pressure 7 mmHg  LVp EDP Pressure 7 mmHg  QP/QS 1  TPVR Index 14.23 HRUI  TSVR Index 33.48 HRUI  PVR SVR Ratio 0.16  TPVR/TSVR Ratio 0.42    Recent Labs: 07/29/2022: ALT 19; Magnesium 1.9 09/03/2022: BUN 29; Creatinine, Ser 1.97; Hemoglobin 12.4; Platelets 287; Potassium 4.2; Sodium 143   Lipid Panel    Component Value Date/Time   CHOL 96 12/04/2019 0503   TRIG 59 12/04/2019 0503   HDL 32 (L) 12/04/2019 0503   CHOLHDL 3.0 12/04/2019 0503   VLDL 12 12/04/2019 0503   LDLCALC  52 12/04/2019 0503     Wt Readings from Last 3 Encounters:  09/25/22 (!) 138.1 kg  09/03/22 (!) 140.8 kg  08/29/22 136.1 kg    Assessment and Plan:   1. Severe Aortic Valve Stenosis: Severe stage D1 aortic valve stenosis. NYHA class 3. I have reviewed the echo images. The aortic valve is thickened, calcified with limited leaflet mobility. I think he would benefit from AVR. Given advanced age, he is not a good candidate for conventional AVR by surgical approach. I think he may be a good candidate for TAVR. Following his staged PCI of the LAD, we will proceed with pre-TAVR CT scans and then have him see one of the CT surgeons on our TAVR team.   I have reviewed the natural history of aortic stenosis with the patient and their family members  who are present today. We have discussed the limitations of medical therapy and the poor prognosis associated with symptomatic aortic stenosis. We have reviewed potential treatment options, including palliative medical therapy, conventional surgical aortic valve replacement, and transcatheter aortic valve replacement. We discussed treatment options in the context of the patient's specific comorbid medical conditions.   2. CAD with unstable angina: He has ongoing chest pain with exertion. Cardiac cath 08/29/22 with severe mid LAD stenosis. Will plan staged PCI of the mid LAD at [facility] on 10/04/22 at noon. He will need orbital atherectomy. He will come in 4 hours early for pre-hydration. Will plan to admit over night post PCI.  I have reviewed the risks, indications, and alternatives to cardiac catheterization, possible angioplasty, and stenting  with the patient. Risks include but are not limited to bleeding, infection, vascular injury, stroke, myocardial infection, arrhythmia, kidney injury, radiation-related injury in the case of prolonged fluoroscopy use, emergency cardiac surgery, and death. The patient understands the risks of serious complication is 1-2 in [id number] with  diagnostic cardiac cath and 1-2% or less with angioplasty/stenting. -Continue ASA, statin and beta blocker.  - Will load Plavix 300 mg today and then begin 75 mg per day - BMET and CBC today  Labs/ tests ordered today include:   Orders Placed This Encounter  Procedures   CBC   Basic metabolic panel   EKG [id number]   Disposition:   F/U with me several weeks after the cath.    Signed, Lauree Chandler, MD, [facility] 09/25/2022 4:25 PM    [facility] [address], [place], [place]  [address] Phone: [phone]; Fax: [phone]

## 2022-09-25 NOTE — Progress Notes (Addendum)
Structural Heart Clinic Note  Chief Complaint  Patient presents with   Follow-up    CAD, Aortic stenosis    History of Present Illness: 77 yo male with history of chronic diastolic CHF, asthma, DM, HTN, hyperlipidemia, paroxysmal atrial fibrillation, prior CVA, nephrolithiasis, severe aortic stenosis and new finding of CAD who is here today in the structural heart clinic for follow up. I saw him as a new consult in  December 2023 to discuss his aortic stenosis and possible TAVR. He has atrial fibrillation and has undergone atrial fibrillation ablation in July 2021. He has been on Eliquis. He had a stroke in April of 2021. Echo 07/31/22 with LVEF=50-55%. Mild LVH. Severe aortic stenosis with mean gradient 51 mmHg, peak gradient 77 mmHg, AVA 0.48 cm2, DI 0.19, SVI 19.  Cardiac cath 08/29/22 with severe mid LAD stenosis. PCI was not performed given his chronic kidney disease.  I saw him on 09/03/22 to discuss staged PCI but he was having blood in his urine. He has since followed up with Urology and this was felt to be due to his kidney stones.   He continues to have dyspnea and chest pain. No dizziness or near syncope.  His urine has cleared.   He lives alone in [place], [place]. He is a retired Counsellor. He has one daughter who lives in [place], [place]. He has no active dental issues. He sees a Pharmacist, community regularly.   Primary Care Physician: Corliss Parish, MD Primary Cardiologist: Harrell Gave Referring Cardiologist: Harrell Gave  Past Medical History:  Diagnosis Date   Allergic rhinitis, cause unspecified    Asthma    Atrial fibrillation ([place])    CAD (coronary artery disease)    Cataract cortical, senile, bilateral    Macular degeneration of left eye    Other and unspecified hyperlipidemia    Type II or unspecified type diabetes mellitus without mention of complication, not stated as uncontrolled    Unspecified essential hypertension     Past Surgical History:  Procedure  Laterality Date   ATRIAL FIBRILLATION ABLATION N/A 03/17/2020   Procedure: ATRIAL FIBRILLATION ABLATION;  Surgeon: Constance Haw, MD;  Location: [facility];  Service: Cardiovascular;  Laterality: N/A;   CARDIOVERSION N/A 09/22/2019   Procedure: CARDIOVERSION;  Surgeon: Donato Heinz, MD;  Location: [facility];  Service: Endoscopy;  Laterality: N/A;   CARDIOVERSION N/A 10/18/2019   Procedure: CARDIOVERSION;  Surgeon: Jerline Pain, MD;  Location: [facility];  Service: Cardiovascular;  Laterality: N/A;   CATARACT EXTRACTION, BILATERAL  08/2020   RIGHT/LEFT HEART CATH AND CORONARY ANGIOGRAPHY N/A 08/29/2022   Procedure: RIGHT/LEFT HEART CATH AND CORONARY ANGIOGRAPHY;  Surgeon: Burnell Blanks, MD;  Location: [facility];  Service: Cardiovascular;  Laterality: N/A;   TEE WITHOUT CARDIOVERSION N/A 09/22/2019   Procedure: TRANSESOPHAGEAL ECHOCARDIOGRAM (TEE);  Surgeon: Donato Heinz, MD;  Location: [facility];  Service: Endoscopy;  Laterality: N/A;   TONSILLECTOMY      Current Outpatient Medications  Medication Sig Dispense Refill   aspirin EC 81 MG EC tablet Take 1 tablet (81 mg total) by mouth daily. 60 tablet 0   atorvastatin (LIPITOR) 80 MG tablet Take 1 tablet (80 mg total) by mouth every evening. 30 tablet 0   B-D ULTRAFINE III SHORT PEN 31G X 8 MM MISC SMARTSIG:Pre-Filled Pen Syringe SUB-Q 3 Times Daily     Cholecalciferol (VITAMIN D3) 50 MCG (2000 UT) TABS Take 2,000 Units by mouth daily.     clopidogrel (PLAVIX)  75 MG tablet Take four tablets (300 mg) by mouth on DAY 1, then take 1 tablet daily 94 tablet 3   ELIQUIS 5 MG TABS tablet TAKE 1 TABLET(5 MG) BY MOUTH TWICE DAILY 180 tablet 2   fluticasone-salmeterol (ADVAIR) 250-50 MCG/ACT AEPB INHALE 1 PUFF INTO THE LUNGS IN THE MORNING AND AT BEDTIME 60 each 11   furosemide (LASIX) 40 MG tablet Take 1 tablet (40 mg total) by mouth daily. 90 tablet 3   levalbuterol (XOPENEX HFA) 45  MCG/ACT inhaler Inhale 2 puffs into the lungs every 6 (six) hours as needed for wheezing or shortness of breath.     metoprolol succinate (TOPROL-XL) 50 MG 24 hr tablet Take 1 tablet (50 mg total) by mouth daily. Take with or immediately following a meal. 90 tablet 3   Multiple Vitamin (MULTIVITAMIN WITH MINERALS) TABS tablet Take 1 tablet by mouth daily.     MYRBETRIQ 25 MG TB24 tablet Take 25 mg by mouth daily.     NOVOLOG MIX 70/30 FLEXPEN (70-30) 100 UNIT/ML FlexPen Inject 20-40 Units into the skin in the morning and at bedtime. Sliding Scale     ONETOUCH VERIO test strip 1 each by Other route in the morning, at noon, and at bedtime.      polyethylene glycol (MIRALAX / GLYCOLAX) 17 g packet Take 17 g by mouth daily. 14 each 0   PREVIDENT 5000 BOOSTER PLUS 1.1 % PSTE Place 1 application onto teeth in the morning and at bedtime.      sertraline (ZOLOFT) 100 MG tablet Take 100 mg by mouth daily.     No current facility-administered medications for this visit.    No Known Allergies  Social History   Socioeconomic History   Marital status: Divorced    Spouse name: Not on file   Number of children: 1   Years of education: Not on file   Highest education level: Professional school degree (e.g., MD, DDS, DVM, JD)  Occupational History   Occupation: Defense attorney  Tobacco Use   Smoking status: Never   Smokeless tobacco: Never  Vaping Use   Vaping Use: Never used  Substance and Sexual Activity   Alcohol use: Not Currently    Comment: Used to drink 2 L bottle of wine/day. Quit 09/18/19   Drug use: Never   Sexual activity: Not Currently  Other Topics Concern   Not on file  Social History Narrative   Not on file   Social Determinants of Health   Financial Resource Strain: Not on file  Food Insecurity: No Food Insecurity (09/27/2019)   Hunger Vital Sign    Worried About Running Out of Food in the Last Year: Never true    Ran Out of Food in the Last Year: Never true   Transportation Needs: No Transportation Needs (09/27/2019)   PRAPARE - Hydrologist (Medical): No    Lack of Transportation (Non-Medical): No  Physical Activity: Not on file  Stress: Not on file  Social Connections: Not on file  Intimate Partner Violence: Not on file    Family History  Problem Relation Age of Onset   Colon cancer Father    Heart disease Paternal Grandfather     Review of Systems:  As stated in the HPI and otherwise negative.   BP (!) 88/60   Pulse 72   Ht '6\' 2"'$  (1.88 m)   Wt (!) 138.1 kg   SpO2 98%   BMI 39.08 kg/m  Physical Examination: General: Well developed, well nourished, NAD  HEENT: OP clear, mucus membranes moist  SKIN: warm, dry. No rashes. Neuro: No focal deficits  Musculoskeletal: Muscle strength 5/5 all ext  Psychiatric: Mood and affect normal  Neck: No JVD, no carotid bruits, no thyromegaly, no lymphadenopathy.  Lungs:Clear bilaterally, no wheezes, rhonci, crackles Cardiovascular: Regular rate and rhythm. No murmurs, gallops or rubs. Abdomen:Soft. Bowel sounds present. Non-tender.  Extremities: No lower extremity edema. Pulses are 2 + in the bilateral DP/PT.  EKG:  EKG is ordered today. The ekg ordered today demonstrates NSR, incomplete RBBB. LAFB  Echo 07/31/22:  1. Left ventricular ejection fraction, by estimation, is 50 to 55%. The  left ventricle has low normal function. The left ventricle has no regional  wall motion abnormalities. There is mild left ventricular hypertrophy.  Left ventricular diastolic  parameters are consistent with Grade III diastolic dysfunction  (restrictive).   2. Right ventricular systolic function is normal. The right ventricular  size is normal. Tricuspid regurgitation signal is inadequate for assessing  PA pressure.   3. The mitral valve is degenerative. No evidence of mitral valve  regurgitation. Moderate mitral annular calcification.   4. The aortic valve is calcified.  Aortic valve regurgitation is mild.  Severe aortic valve stenosis.   5. There is borderline dilatation of the ascending aorta, measuring 39  mm.   6. The inferior vena cava is normal in size with greater than 50%  respiratory variability, suggesting right atrial pressure of 3 mmHg.   Conclusion(s)/Recommendation(s): Compared to prior TTE, AS gradient has  increased, it is now severe.   FINDINGS   Left Ventricle: Left ventricular ejection fraction, by estimation, is 50  to 55%. The left ventricle has low normal function. The left ventricle has  no regional wall motion abnormalities. Definity contrast agent was given  IV to delineate the left  ventricular endocardial borders. The left ventricular internal cavity size  was normal in size. There is mild left ventricular hypertrophy. Left  ventricular diastolic parameters are consistent with Grade III diastolic  dysfunction (restrictive).   Right Ventricle: The right ventricular size is normal. Right ventricular  systolic function is normal. Tricuspid regurgitation signal is inadequate  for assessing PA pressure.   Left Atrium: Left atrial size was normal in size.   Right Atrium: Right atrial size was normal in size.   Pericardium: There is no evidence of pericardial effusion.   Mitral Valve: The mitral valve is degenerative in appearance. There is  moderate thickening of the mitral valve leaflet(s). Moderate mitral  annular calcification. No evidence of mitral valve regurgitation. The mean  mitral valve gradient is 11.0 mmHg.   Tricuspid Valve: Tricuspid valve regurgitation is not demonstrated.   Aortic Valve: The aortic valve is calcified. Aortic valve regurgitation is  mild. Severe aortic stenosis is present. Aortic valve mean gradient  measures 51.0 mmHg. Aortic valve peak gradient measures 77.1 mmHg. Aortic  valve area, by VTI measures 0.59 cm.   Pulmonic Valve: Pulmonic valve regurgitation is not visualized.   Aorta:  There is borderline dilatation of the ascending aorta, measuring 39  mm.   Venous: The inferior vena cava is normal in size with greater than 50%  respiratory variability, suggesting right atrial pressure of 3 mmHg.   IAS/Shunts: No atrial level shunt detected by color flow Doppler.     LEFT VENTRICLE  PLAX 2D  LVIDd:         3.90 cm   Diastology  LVIDs:  2.89 cm   LV e' medial:    8.27 cm/s  LV PW:         2.10 cm   LV E/e' medial:  20.2  LV IVS:        1.89 cm   LV e' lateral:   11.00 cm/s  LVOT diam:     2.00 cm   LV E/e' lateral: 15.2  LV SV:         50  LV SV Index:   19  LVOT Area:     3.14 cm     RIGHT VENTRICLE  RV Basal diam:  3.78 cm  RV Mid diam:    3.14 cm  RV S prime:     12.90 cm/s  TAPSE (M-mode): 1.8 cm   LEFT ATRIUM             Index        RIGHT ATRIUM           Index  LA diam:        4.00 cm 1.55 cm/m   RA Area:     24.90 cm  LA Vol (A2C):   59.5 ml 23.04 ml/m  RA Volume:   78.10 ml  30.25 ml/m  LA Vol (A4C):   45.4 ml 17.58 ml/m  LA Biplane Vol: 52.1 ml 20.18 ml/m   AORTIC VALVE  AV Area (Vmax):    0.58 cm  AV Area (Vmean):   0.48 cm  AV Area (VTI):     0.59 cm  AV Vmax:           439.00 cm/s  AV Vmean:          340.000 cm/s  AV VTI:            0.839 m  AV Peak Grad:      77.1 mmHg  AV Mean Grad:      51.0 mmHg  LVOT Vmax:         81.30 cm/s  LVOT Vmean:        52.200 cm/s  LVOT VTI:          0.158 m  LVOT/AV VTI ratio: 0.19    AORTA  Ao Root diam: 2.90 cm  Ao Asc diam:  3.90 cm   MITRAL VALVE  MV Area (PHT): 3.12 cm     SHUNTS  MV Mean grad:  11.0 mmHg    Systemic VTI:  0.16 m  MV Decel Time: 243 msec     Systemic Diam: 2.00 cm  MV E velocity: 167.00 cm/s  MV A velocity: 68.70 cm/s  MV E/A ratio:  2.43   Cardiac cath 08/29/22:    Prox RCA to Mid RCA lesion is 30% stenosed.   Mid LAD lesion is 80% stenosed.   The LAD and Circumflex arise from different ostia.  Severe mid LAD stenosis The Circumflex is a large  co-dominant vessel with no obvious disease based on non-selective angiography.  (Unable to selectively engage the Circumflex despite the use of multiple catheters).  The RCA is a moderate caliber co-dominant vessel with mild mid vessel stenosis.  Severe aortic stenosis (mean gradient 45.5 mmHg, peak to peak gradient 62.8 mmHg, AVA 0.65 cm2).    Recommendations: Will plan to stage PCI of the LAD. I will see him back in the office 1-2 weeks from now to recheck his renal function and review the PCI in detail. Will continue workup for TAVR after his PCI of the LAD. I  will load his anti-platelet agent when he is seen in the office.    Indications  Severe aortic stenosis [I35.0 (ICD-10-CM)]  Coronary artery disease involving native coronary artery of native heart without angina pectoris [I25.10 (ICD-10-CM)]   Procedural Details  Technical Details Indication: 77 yo male with history of chronic diastolic CHF, asthma, DM, HTN, hyperlipidemia, paroxysmal atrial fibrillation, prior CVA and severe aortic stenosis who is undergoing workup for TAVR.   Procedure: The risks, benefits, complications, treatment options, and expected outcomes were discussed with the patient. The patient and/or family concurred with the proposed plan, giving informed consent. The patient was sedated with Versed and Fentanyl. The IV catheter in the right wrist was changed for a 5 French sheath. Right heart catheterization performed with a balloon tipped catheter. The right wrist was prepped and draped in a sterile fashion. 1% lidocaine was used for local anesthesia. Using the modified Seldinger access technique, a 5 French sheath was placed in the right radial artery using u/s guidance. 3 mg Verapamil was given through the sheath. Weight based IV heparin was given. Standard diagnostic catheters were used to perform selective coronary angiography. LV pressures measured with the JR4 catheter. All catheter exchanges were performed over an  exchange length guidewire.   The sheath was removed from the right radial artery and a hemostasis band was applied at the arteriotomy site on the right wrist.      Estimated blood loss <50 mL.   During this procedure medications were administered to achieve and maintain moderate conscious sedation while the patient's heart rate, blood pressure, and oxygen saturation were continuously monitored and I was present face-to-face 100% of this time.   Medications (Filter: Administrations occurring from 1027 to 1140 on 08/29/22)  important  Continuous medications are totaled by the amount administered until 08/29/22 1140.   fentaNYL (SUBLIMAZE) injection (mcg)  Total dose: 50 mcg Date/Time Rate/Dose/Volume Action   08/29/22 1038 50 mcg Given   midazolam (VERSED) injection (mg)  Total dose: 2 mg Date/Time Rate/Dose/Volume Action   08/29/22 1038 2 mg Given   Heparin (Porcine) in NaCl 1000-0.9 UT/500ML-% SOLN (mL)  Total volume: 1,000 mL Date/Time Rate/Dose/Volume Action   08/29/22 1050 500 mL Given   1050 500 mL Given   lidocaine (PF) (XYLOCAINE) 1 % injection (mL)  Total volume: 4 mL Date/Time Rate/Dose/Volume Action   08/29/22 1052 2 mL Given   1055 2 mL Given   Radial Cocktail/Verapamil only (mL)  Total volume: 10 mL Date/Time Rate/Dose/Volume Action   08/29/22 1057 10 mL Given   heparin sodium (porcine) injection (Units)  Total dose: 6,000 Units Date/Time Rate/Dose/Volume Action   08/29/22 1104 6,000 Units Given    Sedation Time  Sedation Time Physician-1: 49 minutes 24 seconds Radiation/Fluoro  Fluoro time: 14.9 (min) DAP: 90.6 (Gycm2) Cumulative Air Kerma: [id number] (mGy) Complications  Complications documented before study signed (08/29/2022 [id number] AM)   No complications were associated with this study.  Documented by Biagio Quint, RT - 08/29/2022 11:41 AM     Coronary Findings  Diagnostic Dominance: Co-dominant Left Anterior Descending  Vessel is large.  Mid  LAD lesion is 80% stenosed.    First Diagonal Branch  Vessel is moderate in size.    Second Diagonal Branch  Vessel is small in size.    Left Circumflex  Vessel is large.    Right Coronary Artery  Prox RCA to Mid RCA lesion is 30% stenosed.    Intervention   No interventions have been documented.  Coronary Diagrams  Diagnostic Dominance: Co-dominant  Intervention   Implants   No implant documentation for this case.   Syngo Images   Show images for CARDIAC CATHETERIZATION Images on Long Term Storage   Show images for [place], Traw to Procedure Log  Procedure Log    Hemo Data  Flowsheet Row Most Recent Value  Fick Cardiac Output 6.17 L/min  Fick Cardiac Output Index 2.39 (L/min)/BSA  Aortic Mean Gradient 45.54 mmHg  Aortic Peak Gradient 62.8 mmHg  Aortic Valve Area 0.65  Aortic Value Area Index 0.25 cm2/BSA  RA A Wave 11 mmHg  RA V Wave 13 mmHg  RA Mean 11 mmHg  RV Systolic Pressure 40 mmHg  RV Diastolic Pressure 3 mmHg  RV EDP 7 mmHg  PA Systolic Pressure 41 mmHg  PA Diastolic Pressure 25 mmHg  PA Mean 34 mmHg  PW A Wave 20 mmHg  PW V Wave 19 mmHg  PW Mean 23 mmHg  AO Systolic Pressure [id number] mmHg  AO Diastolic Pressure 75 mmHg  AO Mean 89 mmHg  LV Systolic Pressure [id number] mmHg  LV Diastolic Pressure 4 mmHg  LV EDP 16 mmHg  AOp Systolic Pressure 87 mmHg  AOp Diastolic Pressure 61 mmHg  AOp Mean Pressure 72 mmHg  LVp Systolic Pressure [id number] mmHg  LVp Diastolic Pressure 7 mmHg  LVp EDP Pressure 7 mmHg  QP/QS 1  TPVR Index 14.23 HRUI  TSVR Index 33.48 HRUI  PVR SVR Ratio 0.16  TPVR/TSVR Ratio 0.42    Recent Labs: 07/29/2022: ALT 19; Magnesium 1.9 09/03/2022: BUN 29; Creatinine, Ser 1.97; Hemoglobin 12.4; Platelets 287; Potassium 4.2; Sodium 143   Lipid Panel    Component Value Date/Time   CHOL 96 12/04/2019 0503   TRIG 59 12/04/2019 0503   HDL 32 (L) 12/04/2019 0503   CHOLHDL 3.0 12/04/2019 0503   VLDL 12 12/04/2019 0503   LDLCALC  52 12/04/2019 0503     Wt Readings from Last 3 Encounters:  09/25/22 (!) 138.1 kg  09/03/22 (!) 140.8 kg  08/29/22 136.1 kg    STS Risk:  Procedure Type: Isolated AVR PERIOPERATIVE OUTCOME ESTIMATE % Operative Mortality 4.92% Morbidity & Mortality 18.4% Stroke 1.57% Renal Failure 4.06% Reoperation 3.96% Prolonged Ventilation 7.81% Deep Sternal Wound Infection 0.168% [place] Hospital Stay (>14 days) 10.1% Short Hospital Stay (<6 days)* 24.6%  Assessment and Plan:   1. Severe Aortic Valve Stenosis: Severe stage D1 aortic valve stenosis. NYHA class 3. I have reviewed the echo images. The aortic valve is thickened, calcified with limited leaflet mobility. I think he would benefit from AVR. Given advanced age, he is not a good candidate for conventional AVR by surgical approach. I think he may be a good candidate for TAVR. Following his staged PCI of the LAD, we will proceed with pre-TAVR CT scans and then have him see one of the CT surgeons on our TAVR team.   I have reviewed the natural history of aortic stenosis with the patient and their family members  who are present today. We have discussed the limitations of medical therapy and the poor prognosis associated with symptomatic aortic stenosis. We have reviewed potential treatment options, including palliative medical therapy, conventional surgical aortic valve replacement, and transcatheter aortic valve replacement. We discussed treatment options in the context of the patient's specific comorbid medical conditions.   2. CAD with unstable angina: He has ongoing chest pain with exertion. Cardiac cath 08/29/22 with severe mid LAD stenosis. Will plan staged PCI of the  mid LAD at [facility] on 10/04/22 at noon. He will need orbital atherectomy. He will come in 4 hours early for pre-hydration. Will plan to admit over night post PCI.  I have reviewed the risks, indications, and alternatives to cardiac catheterization, possible angioplasty, and stenting  with the patient. Risks include but are not limited to bleeding, infection, vascular injury, stroke, myocardial infection, arrhythmia, kidney injury, radiation-related injury in the case of prolonged fluoroscopy use, emergency cardiac surgery, and death. The patient understands the risks of serious complication is 1-2 in [id number] with diagnostic cardiac cath and 1-2% or less with angioplasty/stenting. -Continue ASA, statin and beta blocker.  - Will load Plavix 300 mg today and then begin 75 mg per day - BMET and CBC today  Labs/ tests ordered today include:   Orders Placed This Encounter  Procedures   CBC   Basic metabolic panel   EKG [id number]   Disposition:   F/U with me several weeks after the cath.    Signed, Lauree Chandler, MD, [facility] 09/25/2022 4:25 PM    [facility] [place], [place], [place]  [address] Phone: [phone]; Fax: [phone]

## 2022-09-26 LAB — BASIC METABOLIC PANEL
BUN/Creatinine Ratio: 16 (ref 10–24)
BUN: 23 mg/dL (ref 8–27)
CO2: 26 mmol/L (ref 20–29)
Calcium: 9.3 mg/dL (ref 8.6–10.2)
Chloride: 103 mmol/L (ref 96–106)
Creatinine, Ser: 1.43 mg/dL — ABNORMAL HIGH (ref 0.76–1.27)
Glucose: 141 mg/dL — ABNORMAL HIGH (ref 70–99)
Potassium: 3.7 mmol/L (ref 3.5–5.2)
Sodium: 144 mmol/L (ref 134–144)
eGFR: 51 mL/min/{1.73_m2} — ABNORMAL LOW (ref >59)

## 2022-09-26 LAB — CBC
Hematocrit: 37.6 % (ref 37.5–51.0)
Hemoglobin: 12.2 g/dL — ABNORMAL LOW (ref 13.0–17.7)
MCH: 29.5 pg (ref 26.6–33.0)
MCHC: 32.4 g/dL (ref 31.5–35.7)
MCV: 91 fL (ref 79–97)
Platelets: 223 10*3/uL (ref 150–450)
RBC: 4.13 x10E6/uL — ABNORMAL LOW (ref 4.14–5.80)
RDW: 13.2 % (ref 11.6–15.4)
WBC: 11.9 10*3/uL — ABNORMAL HIGH (ref 3.4–10.8)

## 2022-09-30 ENCOUNTER — Encounter: Payer: Self-pay | Admitting: Cardiovascular Disease

## 2022-10-02 ENCOUNTER — Telehealth: Payer: Self-pay | Admitting: *Deleted

## 2022-10-02 NOTE — Telephone Encounter (Signed)
Coronary Atherectomy scheduled at [facility] for: Friday October 04, 2022 12 Noon Arrival time and place: [facility] Main Entrance A at: 7:30 AM-pre-procedure hydration  Nothing to eat after midnight prior to procedure, clear liquids until 5 AM day of procedure.  Medication instructions: -Hold:  Eliquis-none 10/02/22 until post procedure  Lasix-AM of procedure  Insulin-AM of procedure-pt reports does not usually take Insulin HS -Other usual morning medications can be taken with sips of water including aspirin 81 mg and Plavix 75 mg.  Confirmed patient has responsible adult to drive home post procedure and be with patient first 24 hours after arriving home.  Reviewed procedure instructions with patient.

## 2022-10-04 ENCOUNTER — Other Ambulatory Visit: Payer: Self-pay

## 2022-10-04 ENCOUNTER — Ambulatory Visit (HOSPITAL_COMMUNITY)
Admission: RE | Disposition: A | Discharge status: Home / Self Care | Payer: Self-pay | Source: Home / Self Care | Attending: Cardiovascular Disease

## 2022-10-04 ENCOUNTER — Ambulatory Visit (HOSPITAL_COMMUNITY)
Admission: RE | Admit: 2022-10-04 | Discharge: 2022-10-05 | Disposition: A | Discharge status: Home / Self Care | Payer: Medicare Other | Attending: Cardiovascular Disease | Admitting: Cardiovascular Disease

## 2022-10-04 DIAGNOSIS — Z7901 Long term (current) use of anticoagulants: Secondary | ICD-10-CM | POA: Insufficient documentation

## 2022-10-04 DIAGNOSIS — I130 Hypertensive heart and chronic kidney disease with heart failure and stage 1 through stage 4 chronic kidney disease, or unspecified chronic kidney disease: Secondary | ICD-10-CM | POA: Insufficient documentation

## 2022-10-04 DIAGNOSIS — J45909 Unspecified asthma, uncomplicated: Secondary | ICD-10-CM | POA: Insufficient documentation

## 2022-10-04 DIAGNOSIS — Z8673 Personal history of transient ischemic attack (TIA), and cerebral infarction without residual deficits: Secondary | ICD-10-CM | POA: Diagnosis not present

## 2022-10-04 DIAGNOSIS — Z955 Presence of coronary angioplasty implant and graft: Secondary | ICD-10-CM | POA: Insufficient documentation

## 2022-10-04 DIAGNOSIS — N183 Chronic kidney disease, stage 3 unspecified: Secondary | ICD-10-CM | POA: Diagnosis not present

## 2022-10-04 DIAGNOSIS — I25110 Atherosclerotic heart disease of native coronary artery with unstable angina pectoris: Secondary | ICD-10-CM | POA: Diagnosis not present

## 2022-10-04 DIAGNOSIS — I2584 Coronary atherosclerosis due to calcified coronary lesion: Secondary | ICD-10-CM | POA: Diagnosis not present

## 2022-10-04 DIAGNOSIS — I200 Unstable angina: Secondary | ICD-10-CM

## 2022-10-04 DIAGNOSIS — E1122 Type 2 diabetes mellitus with diabetic chronic kidney disease: Secondary | ICD-10-CM | POA: Diagnosis not present

## 2022-10-04 DIAGNOSIS — Z794 Long term (current) use of insulin: Secondary | ICD-10-CM | POA: Insufficient documentation

## 2022-10-04 DIAGNOSIS — I352 Nonrheumatic aortic (valve) stenosis with insufficiency: Secondary | ICD-10-CM | POA: Diagnosis not present

## 2022-10-04 DIAGNOSIS — Z7902 Long term (current) use of antithrombotics/antiplatelets: Secondary | ICD-10-CM | POA: Diagnosis not present

## 2022-10-04 DIAGNOSIS — E785 Hyperlipidemia, unspecified: Secondary | ICD-10-CM | POA: Diagnosis not present

## 2022-10-04 DIAGNOSIS — Z7982 Long term (current) use of aspirin: Secondary | ICD-10-CM | POA: Insufficient documentation

## 2022-10-04 DIAGNOSIS — I5032 Chronic diastolic (congestive) heart failure: Secondary | ICD-10-CM | POA: Insufficient documentation

## 2022-10-04 DIAGNOSIS — E119 Type 2 diabetes mellitus without complications: Secondary | ICD-10-CM

## 2022-10-04 DIAGNOSIS — I25119 Atherosclerotic heart disease of native coronary artery with unspecified angina pectoris: Secondary | ICD-10-CM | POA: Diagnosis present

## 2022-10-04 DIAGNOSIS — I480 Paroxysmal atrial fibrillation: Secondary | ICD-10-CM | POA: Diagnosis not present

## 2022-10-04 DIAGNOSIS — I350 Nonrheumatic aortic (valve) stenosis: Secondary | ICD-10-CM

## 2022-10-04 HISTORY — PX: CORONARY STENT INTERVENTION: CATH118234

## 2022-10-04 HISTORY — PX: CORONARY ATHERECTOMY: CATH118238

## 2022-10-04 LAB — GLUCOSE, CAPILLARY: Glucose-Capillary: 151 mg/dL — ABNORMAL HIGH (ref 70–99)

## 2022-10-04 LAB — POCT ACTIVATED CLOTTING TIME
Activated Clotting Time: 266 seconds
Activated Clotting Time: 287 seconds

## 2022-10-04 SURGERY — CORONARY ATHERECTOMY
Anesthesia: LOCAL

## 2022-10-04 MED ORDER — MIRABEGRON ER 25 MG PO TB24
25.0000 mg | ORAL_TABLET | Freq: Every day | ORAL | Status: DC
  Administered 2022-10-05: 25 mg via ORAL
  Filled 2022-10-04: qty 1

## 2022-10-04 MED ORDER — SODIUM CHLORIDE 0.9 % IV SOLN: 250.0000 mL | INTRAVENOUS | Status: DC | PRN

## 2022-10-04 MED ORDER — MIDAZOLAM HCL 2 MG/2ML IJ SOLN
INTRAMUSCULAR | Status: DC | PRN
  Administered 2022-10-04: 1 mg via INTRAVENOUS

## 2022-10-04 MED ORDER — SODIUM CHLORIDE 0.9 % WEIGHT BASED INFUSION
3.0000 mL/kg/h | INTRAVENOUS | Status: DC
  Administered 2022-10-04: 3 mL/kg/h via INTRAVENOUS

## 2022-10-04 MED ORDER — HEPARIN SODIUM (PORCINE) 1000 UNIT/ML IJ SOLN
INTRAMUSCULAR | Status: AC
  Filled 2022-10-04: qty 10

## 2022-10-04 MED ORDER — FENTANYL CITRATE (PF) 100 MCG/2ML IJ SOLN
INTRAMUSCULAR | Status: AC
  Filled 2022-10-04: qty 2

## 2022-10-04 MED ORDER — SODIUM CHLORIDE 0.9% FLUSH: 3.0000 mL | Freq: Two times a day (BID) | INTRAVENOUS | Status: DC

## 2022-10-04 MED ORDER — LIDOCAINE HCL (PF) 1 % IJ SOLN
INTRAMUSCULAR | Status: AC
  Filled 2022-10-04: qty 30

## 2022-10-04 MED ORDER — ASPIRIN 81 MG PO TBEC
81.0000 mg | DELAYED_RELEASE_TABLET | Freq: Every day | ORAL | Status: DC
  Administered 2022-10-05: 81 mg via ORAL
  Filled 2022-10-04: qty 1

## 2022-10-04 MED ORDER — ATORVASTATIN CALCIUM 80 MG PO TABS: 80.0000 mg | ORAL_TABLET | Freq: Every evening | ORAL | Status: DC

## 2022-10-04 MED ORDER — METOPROLOL SUCCINATE ER 25 MG PO TB24: 50.0000 mg | ORAL_TABLET | Freq: Every day | ORAL | Status: DC

## 2022-10-04 MED ORDER — HYDRALAZINE HCL 20 MG/ML IJ SOLN: 10.0000 mg | INTRAMUSCULAR | Status: AC | PRN

## 2022-10-04 MED ORDER — SODIUM CHLORIDE 0.9% FLUSH
3.0000 mL | Freq: Two times a day (BID) | INTRAVENOUS | Status: DC
  Administered 2022-10-04 – 2022-10-05 (×2): 3 mL via INTRAVENOUS

## 2022-10-04 MED ORDER — ACETAMINOPHEN 325 MG PO TABS: 650.0000 mg | ORAL_TABLET | ORAL | Status: DC | PRN

## 2022-10-04 MED ORDER — NITROGLYCERIN 1 MG/10 ML FOR IR/CATH LAB
INTRA_ARTERIAL | Status: AC
  Filled 2022-10-04: qty 10

## 2022-10-04 MED ORDER — SODIUM CHLORIDE 0.9 % IV SOLN: INTRAVENOUS | Status: AC

## 2022-10-04 MED ORDER — CLOPIDOGREL BISULFATE 75 MG PO TABS
75.0000 mg | ORAL_TABLET | Freq: Every day | ORAL | Status: DC
  Administered 2022-10-05: 75 mg via ORAL
  Filled 2022-10-04: qty 1

## 2022-10-04 MED ORDER — SODIUM CHLORIDE 0.9% FLUSH: 3.0000 mL | INTRAVENOUS | Status: DC | PRN

## 2022-10-04 MED ORDER — HEPARIN SODIUM (PORCINE) 1000 UNIT/ML IJ SOLN
INTRAMUSCULAR | Status: DC | PRN
  Administered 2022-10-04: 3000 [IU] via INTRAVENOUS
  Administered 2022-10-04: 2000 [IU] via INTRAVENOUS
  Administered 2022-10-04: 13000 [IU] via INTRAVENOUS

## 2022-10-04 MED ORDER — SODIUM CHLORIDE 0.9 % WEIGHT BASED INFUSION
1.0000 mL/kg/h | INTRAVENOUS | Status: DC
  Administered 2022-10-04: 1 mL/kg/h via INTRAVENOUS

## 2022-10-04 MED ORDER — MOMETASONE FURO-FORMOTEROL FUM 200-5 MCG/ACT IN AERO
2.0000 | INHALATION_SPRAY | Freq: Two times a day (BID) | RESPIRATORY_TRACT | Status: DC
  Administered 2022-10-04 – 2022-10-05 (×2): 2 via RESPIRATORY_TRACT
  Filled 2022-10-04: qty 8.8

## 2022-10-04 MED ORDER — LABETALOL HCL 5 MG/ML IV SOLN: 10.0000 mg | INTRAVENOUS | Status: AC | PRN

## 2022-10-04 MED ORDER — HEPARIN (PORCINE) IN NACL 1000-0.9 UT/500ML-% IV SOLN
INTRAVENOUS | Status: DC | PRN
  Administered 2022-10-04 (×2): 500 mL

## 2022-10-04 MED ORDER — SERTRALINE HCL 100 MG PO TABS
100.0000 mg | ORAL_TABLET | Freq: Every day | ORAL | Status: DC
  Administered 2022-10-05: 100 mg via ORAL
  Filled 2022-10-04: qty 1

## 2022-10-04 MED ORDER — VERAPAMIL HCL 2.5 MG/ML IV SOLN
INTRAVENOUS | Status: AC
  Filled 2022-10-04: qty 2

## 2022-10-04 MED ORDER — FENTANYL CITRATE (PF) 100 MCG/2ML IJ SOLN
INTRAMUSCULAR | Status: DC | PRN
  Administered 2022-10-04: 25 ug via INTRAVENOUS

## 2022-10-04 MED ORDER — IOHEXOL 350 MG/ML SOLN
INTRAVENOUS | Status: DC | PRN
  Administered 2022-10-04: 125 mL

## 2022-10-04 MED ORDER — FUROSEMIDE 20 MG PO TABS
40.0000 mg | ORAL_TABLET | Freq: Every day | ORAL | Status: DC
  Administered 2022-10-04 – 2022-10-05 (×2): 40 mg via ORAL
  Filled 2022-10-04 (×2): qty 2

## 2022-10-04 MED ORDER — ONDANSETRON HCL 4 MG/2ML IJ SOLN: 4.0000 mg | Freq: Four times a day (QID) | INTRAMUSCULAR | Status: DC | PRN

## 2022-10-04 MED ORDER — LIDOCAINE HCL (PF) 1 % IJ SOLN
INTRAMUSCULAR | Status: DC | PRN
  Administered 2022-10-04: 2 mL

## 2022-10-04 MED ORDER — ASPIRIN 81 MG PO CHEW: 81.0000 mg | CHEWABLE_TABLET | ORAL | Status: DC

## 2022-10-04 MED ORDER — MIDAZOLAM HCL 2 MG/2ML IJ SOLN
INTRAMUSCULAR | Status: AC
  Filled 2022-10-04: qty 2

## 2022-10-04 MED ORDER — VERAPAMIL HCL 2.5 MG/ML IV SOLN
INTRAVENOUS | Status: DC | PRN
  Administered 2022-10-04: 10 mL via INTRA_ARTERIAL

## 2022-10-04 SURGICAL SUPPLY — 21 items
BALL SAPPHIRE NC24 3.0X8 (BALLOONS) ×1
BALLN SAPPHIRE 2.0X15 (BALLOONS) ×1
BALLOON SAPPHIRE 2.0X15 (BALLOONS) IMPLANT
BALLOON SAPPHIRE NC24 3.0X8 (BALLOONS) IMPLANT
CATH VISTA GUIDE 6FR JL3.5 (CATHETERS) IMPLANT
CATH VISTA GUIDE 6FR XBLAD3.5 (CATHETERS) IMPLANT
CROWN DIAMONDBACK CLASSIC 1.25 (BURR) IMPLANT
DEVICE RAD COMP TR BAND LRG (VASCULAR PRODUCTS) IMPLANT
GLIDESHEATH SLEND SS 6F .021 (SHEATH) IMPLANT
GUIDEWIRE INQWIRE 1.5J.035X260 (WIRE) IMPLANT
INQWIRE 1.5J .035X260CM (WIRE) ×1
KIT ENCORE 26 ADVANTAGE (KITS) IMPLANT
KIT HEART LEFT (KITS) ×1 IMPLANT
LUBRICANT VIPERSLIDE CORONARY (MISCELLANEOUS) IMPLANT
PACK CARDIAC CATHETERIZATION (CUSTOM PROCEDURE TRAY) ×1 IMPLANT
STENT SYNERGY XD 2.25X24 (Permanent Stent) IMPLANT
SYNERGY XD 2.25X24 (Permanent Stent) ×1 IMPLANT
TRANSDUCER W/STOPCOCK (MISCELLANEOUS) ×1 IMPLANT
TUBING CIL FLEX 10 FLL-RA (TUBING) ×1 IMPLANT
WIRE COUGAR XT STRL 190CM (WIRE) IMPLANT
WIRE VIPERWIRE COR FLEX .012 (WIRE) IMPLANT

## 2022-10-04 NOTE — Interval H&P Note (Signed)
History and Physical Interval Note:  10/04/2022 7:42 AM  Brandon Sanchez  has presented today for surgery, with the diagnosis of cad.  The various methods of treatment have been discussed with the patient and family. After consideration of risks, benefits and other options for treatment, the patient has consented to  Procedure(s): CORONARY ATHERECTOMY (N/A) as a surgical intervention.  The patient's history has been reviewed, patient examined, no change in status, stable for surgery.  I have reviewed the patient's chart and labs.  Questions were answered to the patient's satisfaction.    Cath Lab Visit (complete for each Cath Lab visit)  Clinical Evaluation Leading to the Procedure:   ACS: No.  Non-ACS:    Anginal Classification: CCS III  Anti-ischemic medical therapy: Minimal Therapy (1 class of medications)  Non-Invasive Test Results: No non-invasive testing performed Planned PCI of LAD  Prior CABG: No previous CABG        Lauree Chandler

## 2022-10-05 ENCOUNTER — Encounter (HOSPITAL_COMMUNITY): Payer: Self-pay | Admitting: Cardiovascular Disease

## 2022-10-05 DIAGNOSIS — I352 Nonrheumatic aortic (valve) stenosis with insufficiency: Secondary | ICD-10-CM | POA: Diagnosis not present

## 2022-10-05 DIAGNOSIS — I25119 Atherosclerotic heart disease of native coronary artery with unspecified angina pectoris: Secondary | ICD-10-CM | POA: Diagnosis not present

## 2022-10-05 DIAGNOSIS — I5032 Chronic diastolic (congestive) heart failure: Secondary | ICD-10-CM | POA: Diagnosis present

## 2022-10-05 DIAGNOSIS — I350 Nonrheumatic aortic (valve) stenosis: Secondary | ICD-10-CM | POA: Diagnosis not present

## 2022-10-05 DIAGNOSIS — I480 Paroxysmal atrial fibrillation: Secondary | ICD-10-CM | POA: Diagnosis not present

## 2022-10-05 DIAGNOSIS — Z955 Presence of coronary angioplasty implant and graft: Secondary | ICD-10-CM | POA: Diagnosis not present

## 2022-10-05 DIAGNOSIS — Z7901 Long term (current) use of anticoagulants: Secondary | ICD-10-CM | POA: Diagnosis not present

## 2022-10-05 DIAGNOSIS — N183 Chronic kidney disease, stage 3 unspecified: Secondary | ICD-10-CM | POA: Diagnosis present

## 2022-10-05 DIAGNOSIS — I2584 Coronary atherosclerosis due to calcified coronary lesion: Secondary | ICD-10-CM | POA: Diagnosis not present

## 2022-10-05 DIAGNOSIS — Z7982 Long term (current) use of aspirin: Secondary | ICD-10-CM | POA: Diagnosis not present

## 2022-10-05 DIAGNOSIS — E1122 Type 2 diabetes mellitus with diabetic chronic kidney disease: Secondary | ICD-10-CM | POA: Diagnosis not present

## 2022-10-05 DIAGNOSIS — I130 Hypertensive heart and chronic kidney disease with heart failure and stage 1 through stage 4 chronic kidney disease, or unspecified chronic kidney disease: Secondary | ICD-10-CM | POA: Diagnosis not present

## 2022-10-05 DIAGNOSIS — I25110 Atherosclerotic heart disease of native coronary artery with unstable angina pectoris: Secondary | ICD-10-CM | POA: Diagnosis not present

## 2022-10-05 DIAGNOSIS — Z7902 Long term (current) use of antithrombotics/antiplatelets: Secondary | ICD-10-CM | POA: Diagnosis not present

## 2022-10-05 LAB — CBC
HCT: 36.8 % — ABNORMAL LOW (ref 39.0–52.0)
Hemoglobin: 11.8 g/dL — ABNORMAL LOW (ref 13.0–17.0)
MCH: 29.6 pg (ref 26.0–34.0)
MCHC: 32.1 g/dL (ref 30.0–36.0)
MCV: 92.2 fL (ref 80.0–100.0)
Platelets: 172 10*3/uL (ref 150–400)
RBC: 3.99 MIL/uL — ABNORMAL LOW (ref 4.22–5.81)
RDW: 14.1 % (ref 11.5–15.5)
WBC: 7.9 10*3/uL (ref 4.0–10.5)
nRBC: 0 % (ref 0.0–0.2)

## 2022-10-05 LAB — BASIC METABOLIC PANEL
Anion gap: 9 (ref 5–15)
BUN: 16 mg/dL (ref 8–23)
CO2: 27 mmol/L (ref 22–32)
Calcium: 8.8 mg/dL — ABNORMAL LOW (ref 8.9–10.3)
Chloride: 107 mmol/L (ref 98–111)
Creatinine, Ser: 1.25 mg/dL — ABNORMAL HIGH (ref 0.61–1.24)
GFR, Estimated: >60 mL/min (ref >60)
Glucose, Bld: 128 mg/dL — ABNORMAL HIGH (ref 70–99)
Potassium: 4.1 mmol/L (ref 3.5–5.1)
Sodium: 143 mmol/L (ref 135–145)

## 2022-10-05 LAB — GLUCOSE, CAPILLARY: Glucose-Capillary: 112 mg/dL — ABNORMAL HIGH (ref 70–99)

## 2022-10-05 MED ORDER — CLOPIDOGREL BISULFATE 75 MG PO TABS: 75.0000 mg | ORAL_TABLET | Freq: Every day | ORAL | 3 refills | Status: DC

## 2022-10-05 MED ORDER — METOPROLOL SUCCINATE ER 25 MG PO TB24
25.0000 mg | ORAL_TABLET | Freq: Every day | ORAL | Status: DC
  Administered 2022-10-05: 25 mg via ORAL
  Filled 2022-10-05: qty 1

## 2022-10-05 MED ORDER — METOPROLOL SUCCINATE ER 25 MG PO TB24: 25.0000 mg | ORAL_TABLET | Freq: Every day | ORAL | 3 refills | Status: DC

## 2022-10-05 NOTE — Discharge Summary (Signed)
Discharge Summary    Patient ID: Brandon Sanchez MRN: [id number]; DOB: 04-15-1946  Admit date: 10/04/2022 Discharge date: 10/05/2022  PCP:  Corliss [facility], [facility] Cardiologist:  Buford Dresser, MD  Electrophysiologist:  Will Meredith Leeds, MD  Structural Heart:  Lauree Chandler, MD      Discharge Diagnoses    Principal Problem:   Coronary artery disease involving native coronary artery of native heart with angina pectoris [facility]) Active Problems:   Aortic stenosis, severe   CKD (chronic kidney disease) stage 3, GFR 30-59 ml/min (HCC)   PAF (paroxysmal atrial fibrillation) (HCC)   Chronic heart failure with preserved ejection fraction (HFpEF) ([place])   Diagnostic Studies/Procedures    CORONARY ATHERECTOMY, CORONARY ATHERECTOMY 10/04/2022 Narrative   Mid LAD lesion is 80% stenosed.   A drug-eluting stent was successfully placed using a SYNERGY XD 2.25X24.   Post intervention, there is a 0% residual stenosis.  Severe, heavily calcified stenosis mid LAD Successful orbital atherectomy with PTCA/DES x 1 mid LAD  Recommendations: Will admit to telemetry post PCI for hydration given CKD. BMET in am. I will plan to see him back in the office post cath and if his renal function is stable, we will then plan his pre-TAVR CT scans. Likely d/c home tomorrow. Continue ASA/Plavix. Resume Eliquis tomorrow. I would continue triple therapy for now as we will be holding his Eliquis around the time of his TAVR and I would like for him to be on DAPT during that period of time.      _____________   History of Present Illness     Brandon Sanchez is a 77 y.o. male with a hx of chronic (HFpEF) heart failure with preserved ejection fraction, paroxysmal atrial fibrillation, severe aortic stenosis, hypertension, diabetes mellitus, prior CVA, chronic kidney disease. He has undergone evaluation for TAVR and cardiac catheterization in January 2024  demonstrated severe mLAD stenosis. PCI was not performed at that time due to chronic kidney disease. PCI was further delayed because of hematuria from kidney stones. He was evaluated again by Dr. Angelena Form 09/25/22 and staged PCI was arranged.   Hospital Course     Consultants: None    The patient was admitted 10/04/22 for staged PCI. He presented early for IV hydration in the setting of chronic kidney disease. He underwent successful orbital atherectomy with PTCA/2.25 x 24 mm Synergy DES to the mid LAD. He was admitted overnight for post PCI hydration. Recommendation is to continue ASA and Plavix along with Eliquis (triple therapy) as he will be holding Eliquis around the time of his upcoming TAVR.   His creatinine this morning is stable at 1.25 (1.43 on 09/25/22).   The patient was seen this morning by Dr. Oval Linsey. His BP has been running low. Therefore, his metoprolol dose was reduced to 25 mg. He is doing well without chest pain and is felt to be stable for discharge to home. He has f/u with Dr. Angelena Form on Feb 23.       Did the patient have an acute coronary syndrome (MI, NSTEMI, STEMI, etc) this admission?:  No                               Did the patient have a percutaneous coronary intervention (stent / angioplasty)?:  Yes.     Cath/PCI Registry Performance & Quality Measures: Aspirin prescribed? - Yes ADP Receptor Inhibitor (Plavix/Clopidogrel, Brilinta/Ticagrelor or Effient/Prasugrel)  prescribed (includes medically managed patients)? - Yes High Intensity Statin (Lipitor 40-9m or Crestor 20-449m prescribed? - Yes For EF <40%, was ACEI/ARB prescribed? - Not Applicable (EF >/= 40AB-123456789For EF <40%, Aldosterone Antagonist (Spironolactone or Eplerenone) prescribed? - Not Applicable (EF >/= 40AB-123456789Cardiac Rehab Phase II ordered? - Yes      _____________  Discharge Vitals Blood pressure (!) 103/45, pulse 61, temperature 98.2 F (36.8 C), temperature source Oral, resp. rate 17, height 6' 2"$   (1.88 m), weight 131.5 kg, SpO2 97 %.  Filed Weights   10/04/22 0745  Weight: 131.5 kg    Labs & Radiologic Studies    CBC Recent Labs    10/05/22 0211  WBC 7.9  HGB 11.8*  HCT 36.8*  MCV 92.2  PLT [id number] Basic Metabolic Panel Recent Labs    10/05/22 0552  NA 143  K 4.1  CL 107  CO2 27  GLUCOSE 128*  BUN 16  CREATININE 1.25*  CALCIUM 8.8*    _____________   Disposition   Pt is being discharged home today in good condition.  Follow-up Plans & Appointments     Discharge Instructions     AMB referral to Phase II Cardiac Rehabilitation   Complete by: As directed    Diagnosis:  Coronary Stents PTCA     After initial evaluation and assessments completed: Virtual Based Care may be provided alone or in conjunction with Phase 2 Cardiac Rehab based on patient barriers.: Yes   Intensive Cardiac Rehabilitation (ICR) [facility] only OR Traditional Cardiac Rehabilitation (TCR) *If criteria for ICR are not met will enroll in TCR ([facility]): Yes   Diet - low sodium heart healthy   Complete by: As directed    Discharge wound care:   Complete by: As directed    Call for any swelling, bleeding, bruising or fever   Driving Restrictions   Complete by: As directed    No driving for 1 week   Increase activity slowly   Complete by: As directed    Lifting restrictions   Complete by: As directed    No lifting over 5 lbs for 2 weeks   Sexual Activity Restrictions   Complete by: As directed    None for 1 week        Discharge Medications   Allergies as of 10/05/2022   No Known Allergies      Medication List     TAKE these medications    aspirin EC 81 MG tablet Take 1 tablet (81 mg total) by mouth daily.   atorvastatin 80 MG tablet Commonly known as: LIPITOR Take 1 tablet (80 mg total) by mouth every evening.   B-D ULTRAFINE III SHORT PEN 31G X 8 MM Misc Generic drug: Insulin Pen Needle SMARTSIG:Pre-Filled Pen Syringe SUB-Q 3 Times Daily   clopidogrel  75 MG tablet Commonly known as: PLAVIX Take 1 tablet (75 mg total) by mouth daily. What changed:  how much to take how to take this when to take this additional instructions   Eliquis 5 MG Tabs tablet Generic drug: apixaban TAKE 1 TABLET(5 MG) BY MOUTH TWICE DAILY   fluticasone-salmeterol 250-50 MCG/ACT Aepb Commonly known as: ADVAIR INHALE 1 PUFF INTO THE LUNGS IN THE MORNING AND AT BEDTIME   furosemide 40 MG tablet Commonly known as: LASIX Take 1 tablet (40 mg total) by mouth daily.   GLUCOSAMINE 1500 COMPLEX PO Take 1,500 mg by mouth 2 (two) times daily.   metoprolol succinate 25 MG  24 hr tablet Commonly known as: TOPROL-XL Take 1 tablet (25 mg total) by mouth daily. Start taking on: October 06, 2022 What changed:  medication strength how much to take additional instructions   multivitamin with minerals Tabs tablet Take 1 tablet by mouth daily.   Myrbetriq 25 MG Tb24 tablet Generic drug: mirabegron ER Take 25 mg by mouth daily.   NovoLOG Mix 70/30 FlexPen (70-30) 100 UNIT/ML FlexPen Generic drug: insulin aspart protamine - aspart Inject 30-60 Units into the skin 2 (two) times daily as needed (high blood glucose). Sliding Scale   OneTouch Verio test strip Generic drug: glucose blood 1 each by Other route in the morning, at noon, and at bedtime.   polyethylene glycol 17 g packet Commonly known as: MIRALAX / GLYCOLAX Take 17 g by mouth daily.   sertraline 100 MG tablet Commonly known as: ZOLOFT Take 100 mg by mouth daily.   Vitamin D3 50 MCG (2000 UT) Tabs Generic drug: Cholecalciferol Take 2,000 Units by mouth daily.               Discharge Care Instructions  (From admission, onward)           Start     Ordered   10/05/22 0000  Discharge wound care:       Comments: Call for any swelling, bleeding, bruising or fever   10/05/22 0905              Outstanding Labs/Studies    Duration of Discharge Encounter   Greater than 30  minutes including physician time.  Signed, Richardson Dopp, PA-C 10/05/2022, 9:07 AM

## 2022-10-05 NOTE — Clinical Documentation Improvement (Signed)
CARDIAC REHAB PHASE I   PRE:  Rate/Rhythm: Sinus 66  BP:  Supine: 109/48     SaO2: 96  MODE:  Ambulation: 300 ft   POST:  Rate/Rhythem: 64  BP:    Sitting: 103/45     SaO2: 94% RA  [id number] Patient ambulated in the hallway with rolling walker. Reported mild shortness of breath. Denied chest pain. Tolerated well. Patient assisted back to recliner with call bell within reach. Patient has stent card. Reviewed exercise guidelines and heart health diabetic diet information. Patient said he may consider outpatient cardiac rehab after his evaluation for TAVR.  Harrell Gave RN

## 2022-10-05 NOTE — Progress Notes (Signed)
Rounding Note    Patient Name: Brandon Sanchez Date of Encounter: 10/05/2022  [place] Cardiologist: Buford Dresser, MD   Subjective   Feeling well.  Eager to go home.  No symptoms.   Inpatient Medications    Scheduled Meds:  aspirin EC  81 mg Oral Daily   atorvastatin  80 mg Oral QPM   clopidogrel  75 mg Oral Daily   furosemide  40 mg Oral Daily   metoprolol succinate  50 mg Oral Daily   mirabegron ER  25 mg Oral Daily   mometasone-formoterol  2 puff Inhalation BID   sertraline  100 mg Oral Daily   sodium chloride flush  3 mL Intravenous Q12H   Continuous Infusions:  sodium chloride     PRN Meds: sodium chloride, acetaminophen, ondansetron (ZOFRAN) IV, sodium chloride flush   Vital Signs    Vitals:   10/04/22 2125 10/05/22 0002 10/05/22 0459 10/05/22 0700  BP: (!) 93/55 (!) 96/47 114/66 (!) 109/48  Pulse: 62 66 63 65  Resp:  18 20 17  $ Temp:  98.8 F (37.1 C) 98.9 F (37.2 C) 98.2 F (36.8 C)  TempSrc:  Oral Oral Oral  SpO2: 97% 97% 98% 95%  Weight:      Height:        Intake/Output Summary (Last 24 hours) at 10/05/2022 0758 Last data filed at 10/05/2022 0700 Gross per 24 hour  Intake 1419.04 ml  Output 890 ml  Net 529.04 ml      10/04/2022    7:45 AM 09/25/2022    2:28 PM 09/03/2022    8:16 AM  Last 3 Weights  Weight (lbs) 290 lb 304 lb 6.4 oz 310 lb 6.4 oz  Weight (kg) 131.543 kg 138.075 kg 140.797 kg      Telemetry    Sinus rhythm.  PACs.  PVCs.- Personally Reviewed  ECG     Sinus rhythm.  Rate 63 bpm.  LAFB. - Personally Reviewed  Physical Exam   VS:  BP (!) 109/48 (BP Location: Left Arm)   Pulse 65   Temp 98.2 F (36.8 C) (Oral)   Resp 17   Ht 6' 2"$  (1.88 m)   Wt 131.5 kg   SpO2 97%   BMI 37.23 kg/m  , BMI Body mass index is 37.23 kg/m. GENERAL:  Well appearing.  No acute distress.  HEENT: Pupils equal round and reactive, fundi not visualized, oral mucosa unremarkable NECK:  No jugular venous distention,  waveform within normal limits, carotid upstroke brisk and symmetric, no bruits, no thyromegaly LUNGS:  Clear to auscultation bilaterally HEART:  RRR.  PMI not displaced or sustained,S1 and S2 within normal limits, no S3, no S4, no clicks, no rubs, III/VI systolic murmur at LUSB ABD:  Flat, positive bowel sounds normal in frequency in pitch, no bruits, no rebound, no guarding, no midline pulsatile mass, no hepatomegaly, no splenomegaly EXT:  2 plus pulses throughout, no edema, no cyanosis no clubbing.  R wrist without ecchymosis or bleeding SKIN:  No rashes no nodules NEURO:  Cranial nerves II through XII grossly intact, motor grossly intact throughout PSYCH:  Cognitively intact, oriented to person place and time   Labs    High Sensitivity Troponin:  No results for input(s): "TROPONINIHS" in the last 720 hours.   Chemistry Recent Labs  Lab 10/05/22 0552  NA 143  K 4.1  CL 107  CO2 27  GLUCOSE 128*  BUN 16  CREATININE 1.25*  CALCIUM 8.8*  GFRNONAA >60  ANIONGAP 9    Lipids No results for input(s): "CHOL", "TRIG", "HDL", "LABVLDL", "LDLCALC", "CHOLHDL" in the last 168 hours.  Hematology Recent Labs  Lab 10/05/22 0211  WBC 7.9  RBC 3.99*  HGB 11.8*  HCT 36.8*  MCV 92.2  MCH 29.6  MCHC 32.1  RDW 14.1  PLT 172   Thyroid No results for input(s): "TSH", "FREET4" in the last 168 hours.  BNPNo results for input(s): "BNP", "PROBNP" in the last 168 hours.  DDimer No results for input(s): "DDIMER" in the last 168 hours.   Radiology    CARDIAC CATHETERIZATION  Result Date: 10/04/2022   Mid LAD lesion is 80% stenosed.   A drug-eluting stent was successfully placed using a SYNERGY XD 2.25X24.   Post intervention, there is a 0% residual stenosis. Severe, heavily calcified stenosis mid LAD Successful orbital atherectomy with PTCA/DES x 1 mid LAD Recommendations: Will admit to telemetry post PCI for hydration given CKD. BMET in am. I will plan to see him back in the office post cath  and if his renal function is stable, we will then plan his pre-TAVR CT scans. Likely d/c home tomorrow. Continue ASA/Plavix. Resume Eliquis tomorrow. I would continue triple therapy for now as we will be holding his Eliquis around the time of his TAVR and I would like for him to be on DAPT during that period of time.    Cardiac Studies   LHC 10/04/22:   Mid LAD lesion is 80% stenosed.   A drug-eluting stent was successfully placed using a SYNERGY XD 2.25X24.   Post intervention, there is a 0% residual stenosis.   Severe, heavily calcified stenosis mid LAD Successful orbital atherectomy with PTCA/DES x 1 mid LAD   Recommendations: Will admit to telemetry post PCI for hydration given CKD. BMET in am. I will plan to see him back in the office post cath and if his renal function is stable, we will then plan his pre-TAVR CT scans. Likely d/c home tomorrow. Continue ASA/Plavix. Resume Eliquis tomorrow. I would continue triple therapy for now as we will be holding his Eliquis around the time of his TAVR and I would like for him to be on DAPT during that period of time.    Echo 07/31/22: IMPRESSIONS    1. Left ventricular ejection fraction, by estimation, is 50 to 55%. The  left ventricle has low normal function. The left ventricle has no regional  wall motion abnormalities. There is mild left ventricular hypertrophy.  Left ventricular diastolic  parameters are consistent with Grade III diastolic dysfunction  (restrictive).   2. Right ventricular systolic function is normal. The right ventricular  size is normal. Tricuspid regurgitation signal is inadequate for assessing  PA pressure.   3. The mitral valve is degenerative. No evidence of mitral valve  regurgitation. Moderate mitral annular calcification.   4. The aortic valve is calcified. Aortic valve regurgitation is mild.  Severe aortic valve stenosis.   5. There is borderline dilatation of the ascending aorta, measuring 39  mm.   6. The  inferior vena cava is normal in size with greater than 50%  respiratory variability, suggesting right atrial pressure of 3 mmHg.     Patient Profile     77 y.o. male with HFpEF, DM, HTN, HL, PAF, CVA, severe AS admitted for staged PCI.    Assessment & Plan    # CAD:  # Hyperlipidemia: Cath for TAVR evaluation revealed severe mid LAD stenosis.  Now s/p  successful orbital atherectomy and PCI.  Renal function improving today.  Continue ASA and clopidogrel.  Continue atorvastatin and reduce metoprolol.   # PAF:  Resume Eliquis today.  Will reduce metoprolol to 25 mg due to low blood pressure.  Blood pressure has been in the 0000000 to [id number] systolic.  # Severe AS:  Will follow up with Dr. Angelena Form for TAVR work up.   # CKD 3: Renal function improving with hydration.  Creatinine has decreased 1.4-1.25.  Stable for discharge.  For questions or updates, please contact [place] Please consult [email or web address] for contact info under        Signed, Skeet Latch, MD  10/05/2022, 7:58 AM

## 2022-10-05 NOTE — Hospital Course (Signed)
77 y.o. male with a hx of chronic (HFpEF) heart failure with preserved ejection fraction, paroxysmal atrial fibrillation, severe aortic stenosis, hypertension, diabetes mellitus, prior CVA, chronic kidney disease. He has undergone evaluation for TAVR and cardiac catheterization in January 2024 demonstrated severe mLAD stenosis. PCI was not performed at that time due to chronic kidney disease. PCI was further delayed because of hematuria from kidney stones. He was evaluated again by Dr. Angelena Form 09/25/22 and staged PCI was arranged.  The patient was admitted 10/04/22 for staged PCI. He presented early for IV hydration in the setting of chronic kidney disease. He underwent successful orbital atherectomy with PTCA/2.25 x 24 mm Synergy DES to the mid LAD. He was admitted overnight for post PCI hydration. Recommendation is to continue ASA and Plavix along with Eliquis (triple therapy) as he will be holding Eliquis around the time of his upcoming TAVR.   His creatinine this morning is stable at 1.25 (1.43 on 09/25/22).   The patient was seen this morning by Dr. Marland Kitchen He is doing well *** and is felt to be stable for discharge to home.   CORONARY ATHERECTOMY, CORONARY ATHERECTOMY 10/04/2022 Narrative   Mid LAD lesion is 80% stenosed.   A drug-eluting stent was successfully placed using a SYNERGY XD 2.25X24.   Post intervention, there is a 0% residual stenosis.  Severe, heavily calcified stenosis mid LAD Successful orbital atherectomy with PTCA/DES x 1 mid LAD  Recommendations: Will admit to telemetry post PCI for hydration given CKD. BMET in am. I will plan to see him back in the office post cath and if his renal function is stable, we will then plan his pre-TAVR CT scans. Likely d/c home tomorrow. Continue ASA/Plavix. Resume Eliquis tomorrow. I would continue triple therapy for now as we will be holding his Eliquis around the time of his TAVR and I would like for him to be on DAPT during that period of time.

## 2022-10-05 NOTE — Progress Notes (Signed)
AVS given and reviewed with pt and daughter at bedside. Medications discussed. All questions answered to satisfaction. Pt and daughter verbalized understanding of information given. Pt escorted off the unit with all belongings via wheelchair by staff member.

## 2022-10-07 ENCOUNTER — Encounter (HOSPITAL_COMMUNITY): Payer: Self-pay | Admitting: Cardiovascular Disease

## 2022-10-07 MED FILL — Nitroglycerin IV Soln 100 MCG/ML in D5W: INTRA_ARTERIAL | Qty: 10 | Status: AC

## 2022-10-11 ENCOUNTER — Ambulatory Visit: Payer: Medicare Other | Attending: Cardiovascular Disease | Admitting: Cardiovascular Disease

## 2022-10-11 ENCOUNTER — Encounter: Payer: Self-pay | Admitting: Cardiovascular Disease

## 2022-10-11 VITALS — BP 116/62 | HR 67 | Ht 74.0 in | Wt 294.8 lb

## 2022-10-11 DIAGNOSIS — I350 Nonrheumatic aortic (valve) stenosis: Secondary | ICD-10-CM | POA: Insufficient documentation

## 2022-10-11 DIAGNOSIS — I25110 Atherosclerotic heart disease of native coronary artery with unstable angina pectoris: Secondary | ICD-10-CM | POA: Diagnosis not present

## 2022-10-11 DIAGNOSIS — Z01812 Encounter for preprocedural laboratory examination: Secondary | ICD-10-CM | POA: Insufficient documentation

## 2022-10-11 DIAGNOSIS — I25118 Atherosclerotic heart disease of native coronary artery with other forms of angina pectoris: Secondary | ICD-10-CM | POA: Diagnosis not present

## 2022-10-11 NOTE — Progress Notes (Signed)
Structural Heart Clinic Note  Chief Complaint  Patient presents with   Follow-up    CAD, Aortic stenosis    History of Present Illness: 77 yo male with history of chronic diastolic CHF, asthma, DM, HTN, hyperlipidemia, paroxysmal atrial fibrillation, prior CVA, nephrolithiasis, severe aortic stenosis and new finding of CAD who is here today in the structural heart clinic for follow up. I saw him as a new consult in  December 2023 to discuss his aortic stenosis and possible TAVR. He has atrial fibrillation and has undergone atrial fibrillation ablation in July 2021. He has been on Eliquis. He had a stroke in April of 2021. Echo 07/31/22 with LVEF=50-55%. Mild LVH. Severe aortic stenosis with mean gradient 51 mmHg, peak gradient 77 mmHg, AVA 0.48 cm2, DI 0.19, SVI 19.  Cardiac cath 08/29/22 with severe mid LAD stenosis. PCI was not performed given his chronic kidney disease.  I saw him on 09/03/22 to discuss staged PCI but he was having blood in his urine. He has since followed up with Urology and this was felt to be due to his kidney stones. I saw him in the office 09/25/22 and his urine had cleared. He underwent PCI with coronary atherectomy and stenting of the LAD on 10/04/22. Excellent result in the LAD with stenting. Creatinine 1.25 one day post PCI.   He is here today for follow up. He is feeling much better following the PCI of his LAD. The patient denies any chest pain, dyspnea, palpitations, lower extremity edema, orthopnea, PND, dizziness, near syncope or syncope. He is here today to review planning for his pre-TAVR CT scans.   He lives alone in [place], [place]. He is a retired Counsellor. He has one daughter who lives in [place], [place]. He has no active dental issues. He sees a Pharmacist, community regularly.   Primary Care Physician: Corliss Parish, MD Primary Cardiologist: Harrell Gave Referring Cardiologist: Harrell Gave  Past Medical History:  Diagnosis Date   Allergic rhinitis, cause  unspecified    Asthma    Atrial fibrillation ([place])    CAD (coronary artery disease)    Cataract cortical, senile, bilateral    Macular degeneration of left eye    Other and unspecified hyperlipidemia    Type II or unspecified type diabetes mellitus without mention of complication, not stated as uncontrolled    Unspecified essential hypertension     Past Surgical History:  Procedure Laterality Date   ATRIAL FIBRILLATION ABLATION N/A 03/17/2020   Procedure: ATRIAL FIBRILLATION ABLATION;  Surgeon: Constance Haw, MD;  Location: [facility];  Service: Cardiovascular;  Laterality: N/A;   CARDIOVERSION N/A 09/22/2019   Procedure: CARDIOVERSION;  Surgeon: Donato Heinz, MD;  Location: [facility];  Service: Endoscopy;  Laterality: N/A;   CARDIOVERSION N/A 10/18/2019   Procedure: CARDIOVERSION;  Surgeon: Jerline Pain, MD;  Location: [facility] ENDOS[facility];  Service: Cardiovascular;  Laterality: N/A;   CATARACT EXTRACTION, BILATERAL  08/2020   CORONARY ATHERECTOMY N/A 10/04/2022   Procedure: CORONARY ATHERECTOMY;  Surgeon: Burnell Blanks, MD;  Location: [facility];  Service: Cardiovascular;  Laterality: N/A;   CORONARY STENT INTERVENTION N/A 10/04/2022   Procedure: CORONARY STENT INTERVENTION;  Surgeon: Burnell Blanks, MD;  Location: [facility];  Service: Cardiovascular;  Laterality: N/A;   RIGHT/LEFT HEART CATH AND CORONARY ANGIOGRAPHY N/A 08/29/2022   Procedure: RIGHT/LEFT HEART CATH AND CORONARY ANGIOGRAPHY;  Surgeon: Burnell Blanks, MD;  Location: [facility];  Service: Cardiovascular;  Laterality: N/A;  TEE WITHOUT CARDIOVERSION N/A 09/22/2019   Procedure: TRANSESOPHAGEAL ECHOCARDIOGRAM (TEE);  Surgeon: Donato Heinz, MD;  Location: [facility] ENDOSCOPY;  Service: Endoscopy;  Laterality: N/A;   TONSILLECTOMY      Current Outpatient Medications  Medication Sig Dispense Refill   aspirin EC 81 MG EC tablet Take 1 tablet (81  mg total) by mouth daily. 60 tablet 0   atorvastatin (LIPITOR) 80 MG tablet Take 1 tablet (80 mg total) by mouth every evening. 30 tablet 0   B-D ULTRAFINE III SHORT PEN 31G X 8 MM MISC SMARTSIG:Pre-Filled Pen Syringe SUB-Q 3 Times Daily     Cholecalciferol (VITAMIN D3) 50 MCG (2000 UT) TABS Take 2,000 Units by mouth daily.     clopidogrel (PLAVIX) 75 MG tablet Take 1 tablet (75 mg total) by mouth daily. 90 tablet 3   ELIQUIS 5 MG TABS tablet TAKE 1 TABLET(5 MG) BY MOUTH TWICE DAILY 180 tablet 2   fluticasone-salmeterol (ADVAIR) 250-50 MCG/ACT AEPB INHALE 1 PUFF INTO THE LUNGS IN THE MORNING AND AT BEDTIME 60 each 11   furosemide (LASIX) 40 MG tablet Take 1 tablet (40 mg total) by mouth daily. 90 tablet 3   Glucosamine-Chondroit-Vit C-Mn (GLUCOSAMINE 1500 COMPLEX PO) Take 1,500 mg by mouth 2 (two) times daily.     metoprolol succinate (TOPROL-XL) 25 MG 24 hr tablet Take 1 tablet (25 mg total) by mouth daily. 90 tablet 3   Multiple Vitamin (MULTIVITAMIN WITH MINERALS) TABS tablet Take 1 tablet by mouth daily.     MYRBETRIQ 25 MG TB24 tablet Take 25 mg by mouth daily.     NOVOLOG MIX 70/30 FLEXPEN (70-30) 100 UNIT/ML FlexPen Inject 30-60 Units into the skin 2 (two) times daily as needed (high blood glucose). Sliding Scale     ONETOUCH VERIO test strip 1 each by Other route in the morning, at noon, and at bedtime.      polyethylene glycol (MIRALAX / GLYCOLAX) 17 g packet Take 17 g by mouth daily. 14 each 0   sertraline (ZOLOFT) 100 MG tablet Take 100 mg by mouth daily.     No current facility-administered medications for this visit.    No Known Allergies  Social History   Socioeconomic History   Marital status: Divorced    Spouse name: Not on file   Number of children: 1   Years of education: Not on file   Highest education level: Professional school degree (e.g., MD, DDS, DVM, JD)  Occupational History   Occupation: Defense attorney  Tobacco Use   Smoking status: Never   Smokeless  tobacco: Never  Vaping Use   Vaping Use: Never used  Substance and Sexual Activity   Alcohol use: Not Currently    Comment: Used to drink 2 L bottle of wine/day. Quit 09/18/19   Drug use: Never   Sexual activity: Not Currently  Other Topics Concern   Not on file  Social History Narrative   Not on file   Social Determinants of Health   Financial Resource Strain: Not on file  Food Insecurity: No Food Insecurity (10/04/2022)   Hunger Vital Sign    Worried About Running Out of Food in the Last Year: Never true    Ran Out of Food in the Last Year: Never true  Transportation Needs: No Transportation Needs (10/04/2022)   PRAPARE - Hydrologist (Medical): No    Lack of Transportation (Non-Medical): No  Physical Activity: Not on file  Stress: Not on file  Social Connections: Not on file  Intimate Partner Violence: Not At Risk (10/04/2022)   Humiliation, Afraid, Rape, and Kick questionnaire    Fear of Current or Ex-Partner: No    Emotionally Abused: No    Physically Abused: No    Sexually Abused: No    Family History  Problem Relation Age of Onset   Colon cancer Father    Heart disease Paternal Grandfather     Review of Systems:  As stated in the HPI and otherwise negative.   BP 116/62   Pulse 67   Ht '6\' 2"'$  (1.88 m)   Wt 133.7 kg   SpO2 97%   BMI 37.85 kg/m   Physical Examination: General: Well developed, well nourished, NAD  HEENT: OP clear, mucus membranes moist  SKIN: warm, dry. No rashes. Neuro: No focal deficits  Musculoskeletal: Muscle strength 5/5 all ext  Psychiatric: Mood and affect normal  Neck: No JVD, no carotid bruits, no thyromegaly, no lymphadenopathy.  Lungs:Clear bilaterally, no wheezes, rhonci, crackles Cardiovascular: Regular rate and rhythm. Harsh systolic murmur Abdomen:Soft. Bowel sounds present. Non-tender.  Extremities: No lower extremity edema. Pulses are 2 + in the bilateral DP/PT.  EKG:  EKG is not ordered  today. The ekg ordered today demonstrates   Echo 07/31/22:  1. Left ventricular ejection fraction, by estimation, is 50 to 55%. The  left ventricle has low normal function. The left ventricle has no regional  wall motion abnormalities. There is mild left ventricular hypertrophy.  Left ventricular diastolic  parameters are consistent with Grade III diastolic dysfunction  (restrictive).   2. Right ventricular systolic function is normal. The right ventricular  size is normal. Tricuspid regurgitation signal is inadequate for assessing  PA pressure.   3. The mitral valve is degenerative. No evidence of mitral valve  regurgitation. Moderate mitral annular calcification.   4. The aortic valve is calcified. Aortic valve regurgitation is mild.  Severe aortic valve stenosis.   5. There is borderline dilatation of the ascending aorta, measuring 39  mm.   6. The inferior vena cava is normal in size with greater than 50%  respiratory variability, suggesting right atrial pressure of 3 mmHg.   Conclusion(s)/Recommendation(s): Compared to prior TTE, AS gradient has  increased, it is now severe.   FINDINGS   Left Ventricle: Left ventricular ejection fraction, by estimation, is 50  to 55%. The left ventricle has low normal function. The left ventricle has  no regional wall motion abnormalities. Definity contrast agent was given  IV to delineate the left  ventricular endocardial borders. The left ventricular internal cavity size  was normal in size. There is mild left ventricular hypertrophy. Left  ventricular diastolic parameters are consistent with Grade III diastolic  dysfunction (restrictive).   Right Ventricle: The right ventricular size is normal. Right ventricular  systolic function is normal. Tricuspid regurgitation signal is inadequate  for assessing PA pressure.   Left Atrium: Left atrial size was normal in size.   Right Atrium: Right atrial size was normal in size.   Pericardium:  There is no evidence of pericardial effusion.   Mitral Valve: The mitral valve is degenerative in appearance. There is  moderate thickening of the mitral valve leaflet(s). Moderate mitral  annular calcification. No evidence of mitral valve regurgitation. The mean  mitral valve gradient is 11.0 mmHg.   Tricuspid Valve: Tricuspid valve regurgitation is not demonstrated.   Aortic Valve: The aortic valve is calcified. Aortic valve regurgitation is  mild. Severe aortic stenosis is  present. Aortic valve mean gradient  measures 51.0 mmHg. Aortic valve peak gradient measures 77.1 mmHg. Aortic  valve area, by VTI measures 0.59 cm.   Pulmonic Valve: Pulmonic valve regurgitation is not visualized.   Aorta: There is borderline dilatation of the ascending aorta, measuring 39  mm.   Venous: The inferior vena cava is normal in size with greater than 50%  respiratory variability, suggesting right atrial pressure of 3 mmHg.   IAS/Shunts: No atrial level shunt detected by color flow Doppler.     LEFT VENTRICLE  PLAX 2D  LVIDd:         3.90 cm   Diastology  LVIDs:         2.89 cm   LV e' medial:    8.27 cm/s  LV PW:         2.10 cm   LV E/e' medial:  20.2  LV IVS:        1.89 cm   LV e' lateral:   11.00 cm/s  LVOT diam:     2.00 cm   LV E/e' lateral: 15.2  LV SV:         50  LV SV Index:   19  LVOT Area:     3.14 cm     RIGHT VENTRICLE  RV Basal diam:  3.78 cm  RV Mid diam:    3.14 cm  RV S prime:     12.90 cm/s  TAPSE (M-mode): 1.8 cm   LEFT ATRIUM             Index        RIGHT ATRIUM           Index  LA diam:        4.00 cm 1.55 cm/m   RA Area:     24.90 cm  LA Vol (A2C):   59.5 ml 23.04 ml/m  RA Volume:   78.10 ml  30.25 ml/m  LA Vol (A4C):   45.4 ml 17.58 ml/m  LA Biplane Vol: 52.1 ml 20.18 ml/m   AORTIC VALVE  AV Area (Vmax):    0.58 cm  AV Area (Vmean):   0.48 cm  AV Area (VTI):     0.59 cm  AV Vmax:           439.00 cm/s  AV Vmean:          340.000 cm/s  AV VTI:             0.839 m  AV Peak Grad:      77.1 mmHg  AV Mean Grad:      51.0 mmHg  LVOT Vmax:         81.30 cm/s  LVOT Vmean:        52.200 cm/s  LVOT VTI:          0.158 m  LVOT/AV VTI ratio: 0.19    AORTA  Ao Root diam: 2.90 cm  Ao Asc diam:  3.90 cm   MITRAL VALVE  MV Area (PHT): 3.12 cm     SHUNTS  MV Mean grad:  11.0 mmHg    Systemic VTI:  0.16 m  MV Decel Time: 243 msec     Systemic Diam: 2.00 cm  MV E velocity: 167.00 cm/s  MV A velocity: 68.70 cm/s  MV E/A ratio:  2.43   Cardiac cath 08/29/22:    Prox RCA to Mid RCA lesion is 30% stenosed.   Mid LAD lesion is 80% stenosed.  The LAD and Circumflex arise from different ostia.  Severe mid LAD stenosis The Circumflex is a large co-dominant vessel with no obvious disease based on non-selective angiography.  (Unable to selectively engage the Circumflex despite the use of multiple catheters).  The RCA is a moderate caliber co-dominant vessel with mild mid vessel stenosis.  Severe aortic stenosis (mean gradient 45.5 mmHg, peak to peak gradient 62.8 mmHg, AVA 0.65 cm2).    Recommendations: Will plan to stage PCI of the LAD. I will see him back in the office 1-2 weeks from now to recheck his renal function and review the PCI in detail. Will continue workup for TAVR after his PCI of the LAD. I will load his anti-platelet agent when he is seen in the office.    Indications  Severe aortic stenosis [I35.0 (ICD-10-CM)]  Coronary artery disease involving native coronary artery of native heart without angina pectoris [I25.10 (ICD-10-CM)]   Procedural Details  Technical Details Indication: 77 yo male with history of chronic diastolic CHF, asthma, DM, HTN, hyperlipidemia, paroxysmal atrial fibrillation, prior CVA and severe aortic stenosis who is undergoing workup for TAVR.   Procedure: The risks, benefits, complications, treatment options, and expected outcomes were discussed with the patient. The patient and/or family concurred with the  proposed plan, giving informed consent. The patient was sedated with Versed and Fentanyl. The IV catheter in the right wrist was changed for a 5 French sheath. Right heart catheterization performed with a balloon tipped catheter. The right wrist was prepped and draped in a sterile fashion. 1% lidocaine was used for local anesthesia. Using the modified Seldinger access technique, a 5 French sheath was placed in the right radial artery using u/s guidance. 3 mg Verapamil was given through the sheath. Weight based IV heparin was given. Standard diagnostic catheters were used to perform selective coronary angiography. LV pressures measured with the JR4 catheter. All catheter exchanges were performed over an exchange length guidewire.   The sheath was removed from the right radial artery and a hemostasis band was applied at the arteriotomy site on the right wrist.      Estimated blood loss <50 mL.   During this procedure medications were administered to achieve and maintain moderate conscious sedation while the patient's heart rate, blood pressure, and oxygen saturation were continuously monitored and I was present face-to-face 100% of this time.   Medications (Filter: Administrations occurring from 1027 to 1140 on 08/29/22)  important  Continuous medications are totaled by the amount administered until 08/29/22 1140.   fentaNYL (SUBLIMAZE) injection (mcg)  Total dose: 50 mcg Date/Time Rate/Dose/Volume Action   08/29/22 1038 50 mcg Given   midazolam (VERSED) injection (mg)  Total dose: 2 mg Date/Time Rate/Dose/Volume Action   08/29/22 1038 2 mg Given   Heparin (Porcine) in NaCl 1000-0.9 UT/500ML-% SOLN (mL)  Total volume: 1,000 mL Date/Time Rate/Dose/Volume Action   08/29/22 1050 500 mL Given   1050 500 mL Given   lidocaine (PF) (XYLOCAINE) 1 % injection (mL)  Total volume: 4 mL Date/Time Rate/Dose/Volume Action   08/29/22 1052 2 mL Given   1055 2 mL Given   Radial Cocktail/Verapamil only  (mL)  Total volume: 10 mL Date/Time Rate/Dose/Volume Action   08/29/22 1057 10 mL Given   heparin sodium (porcine) injection (Units)  Total dose: 6,000 Units Date/Time Rate/Dose/Volume Action   08/29/22 1104 6,000 Units Given    Sedation Time  Sedation Time Physician-1: 49 minutes 24 seconds Radiation/Fluoro  Fluoro time: 14.9 (min) DAP: 90.6 (Gycm2)  Cumulative Air Kerma: [id number] (mGy) Complications  Complications documented before study signed (08/29/2022 [id number] AM)   No complications were associated with this study.  Documented by Biagio Quint, RT - 08/29/2022 11:41 AM     Coronary Findings  Diagnostic Dominance: Co-dominant Left Anterior Descending  Vessel is large.  Mid LAD lesion is 80% stenosed.    First Diagonal Branch  Vessel is moderate in size.    Second Diagonal Branch  Vessel is small in size.    Left Circumflex  Vessel is large.    Right Coronary Artery  Prox RCA to Mid RCA lesion is 30% stenosed.    Intervention   No interventions have been documented.   Coronary Diagrams  Diagnostic Dominance: Co-dominant  Intervention   Implants   No implant documentation for this case.   Syngo Images   Show images for CARDIAC CATHETERIZATION Images on Long Term Storage   Show images for Talvin, Borey to Procedure Log  Procedure Log    Hemo Data  Flowsheet Row Most Recent Value  Fick Cardiac Output 6.17 L/min  Fick Cardiac Output Index 2.39 (L/min)/BSA  Aortic Mean Gradient 45.54 mmHg  Aortic Peak Gradient 62.8 mmHg  Aortic Valve Area 0.65  Aortic Value Area Index 0.25 cm2/BSA  RA A Wave 11 mmHg  RA V Wave 13 mmHg  RA Mean 11 mmHg  RV Systolic Pressure 40 mmHg  RV Diastolic Pressure 3 mmHg  RV EDP 7 mmHg  PA Systolic Pressure 41 mmHg  PA Diastolic Pressure 25 mmHg  PA Mean 34 mmHg  PW A Wave 20 mmHg  PW V Wave 19 mmHg  PW Mean 23 mmHg  AO Systolic Pressure [id number] mmHg  AO Diastolic Pressure 75 mmHg  AO Mean 89 mmHg  LV  Systolic Pressure [id number] mmHg  LV Diastolic Pressure 4 mmHg  LV EDP 16 mmHg  AOp Systolic Pressure 87 mmHg  AOp Diastolic Pressure 61 mmHg  AOp Mean Pressure 72 mmHg  LVp Systolic Pressure [id number] mmHg  LVp Diastolic Pressure 7 mmHg  LVp EDP Pressure 7 mmHg  QP/QS 1  TPVR Index 14.23 HRUI  TSVR Index 33.48 HRUI  PVR SVR Ratio 0.16  TPVR/TSVR Ratio 0.42    Recent Labs: 07/29/2022: ALT 19; Magnesium 1.9 10/05/2022: BUN 16; Creatinine, Ser 1.25; Hemoglobin 11.8; Platelets 172; Potassium 4.1; Sodium 143   Lipid Panel    Component Value Date/Time   CHOL 96 12/04/2019 0503   TRIG 59 12/04/2019 0503   HDL 32 (L) 12/04/2019 0503   CHOLHDL 3.0 12/04/2019 0503   VLDL 12 12/04/2019 0503   LDLCALC 52 12/04/2019 0503     Wt Readings from Last 3 Encounters:  10/11/22 133.7 kg  10/04/22 131.5 kg  09/25/22 (!) 138.1 kg    Assessment and Plan:   1. Severe Aortic Valve Stenosis: Severe stage D1 aortic valve stenosis. NYHA class 3. I have reviewed the echo images. The aortic valve is thickened, calcified with limited leaflet mobility. I think he would benefit from AVR. Given advanced age, he is not a good candidate for conventional AVR by surgical approach. I think he may be a good candidate for TAVR. He is doing well following his PCI with stenting of the LAD last week. We will proceed with pre-TAVR CT scans if his renal function is stable. He will then have him see one of the CT surgeons on our TAVR team.   I have again reviewed the natural history of aortic stenosis with the  patient and their family members  who are present today. We have discussed the limitations of medical therapy and the poor prognosis associated with symptomatic aortic stenosis. We have reviewed potential treatment options, including palliative medical therapy, conventional surgical aortic valve replacement, and transcatheter aortic valve replacement. We discussed treatment options in the context of the patient's specific  comorbid medical conditions.   2. CAD with unstable angina: No chest pain following his PCI. Will continue ASA, Plavix, statin and beta blocker. We have reviewed continuing the DAPT along with Eliquis for now because we will be interrupting his Eliquis therapy prior to his TAVR for several days. I do not want to risk LAD stent thrombosis. If he has any bleeding while on triple therapy, we would need to stop his ASA. I would anticipate stopping ASA post TAVR once Eliquis is restarted.  -BMET today  Labs/ tests ordered today include:   Orders Placed This Encounter  Procedures   Basic Metabolic Panel (BMET)   Disposition:   F/U with the valve team after his TAVR   Signed, Lauree Chandler, MD, [facility] 10/11/2022 3:11 PM    [facility] [address], [place], [place]  [id number] Phone: [phone]; Fax: [phone]

## 2022-10-11 NOTE — Progress Notes (Signed)
Pre Surgical Assessment: 5 M Walk Test  42M=16.98f  5 Meter Walk Test- trial 1: 9.59 seconds 5 Meter Walk Test- trial 2: 9.10 seconds 5 Meter Walk Test- trial 3: 10.31 seconds 5 Meter Walk Test Average: 9.66 seconds

## 2022-10-11 NOTE — Patient Instructions (Signed)
Medication Instructions:  No changes *If you need a refill on your cardiac medications before your next appointment, please call your pharmacy*   Lab Work: Today: bmet   Testing/Procedures: Per Structural Heart Team   Follow-Up: Per Structural Heart Team

## 2022-10-12 LAB — BASIC METABOLIC PANEL
BUN/Creatinine Ratio: 15 (ref 10–24)
BUN: 19 mg/dL (ref 8–27)
CO2: 24 mmol/L (ref 20–29)
Calcium: 9.4 mg/dL (ref 8.6–10.2)
Chloride: 102 mmol/L (ref 96–106)
Creatinine, Ser: 1.28 mg/dL — ABNORMAL HIGH (ref 0.76–1.27)
Glucose: 154 mg/dL — ABNORMAL HIGH (ref 70–99)
Potassium: 3.7 mmol/L (ref 3.5–5.2)
Sodium: 146 mmol/L — ABNORMAL HIGH (ref 134–144)
eGFR: 58 mL/min/{1.73_m2} — ABNORMAL LOW (ref >59)

## 2022-10-12 NOTE — Progress Notes (Signed)
Renal function is stable. cdm

## 2022-10-13 ENCOUNTER — Other Ambulatory Visit: Payer: Self-pay

## 2022-10-13 DIAGNOSIS — I350 Nonrheumatic aortic (valve) stenosis: Secondary | ICD-10-CM

## 2022-10-21 ENCOUNTER — Ambulatory Visit (HOSPITAL_COMMUNITY)
Admission: RE | Admit: 2022-10-21 | Discharge: 2022-10-21 | Disposition: A | Discharge status: Home / Self Care | Payer: Medicare Other | Source: Ambulatory Visit | Attending: Cardiovascular Disease | Admitting: Cardiovascular Disease

## 2022-10-21 DIAGNOSIS — Z01810 Encounter for preprocedural cardiovascular examination: Secondary | ICD-10-CM | POA: Diagnosis not present

## 2022-10-21 DIAGNOSIS — I350 Nonrheumatic aortic (valve) stenosis: Secondary | ICD-10-CM | POA: Diagnosis not present

## 2022-10-21 MED ORDER — IOHEXOL 350 MG/ML SOLN
95.0000 mL | Freq: Once | INTRAVENOUS | Status: AC | PRN
  Administered 2022-10-21: 95 mL via INTRAVENOUS

## 2022-10-30 ENCOUNTER — Institutional Professional Consult (permissible substitution) (INDEPENDENT_AMBULATORY_CARE_PROVIDER_SITE_OTHER): Payer: Medicare Other | Admitting: Surgery

## 2022-10-30 ENCOUNTER — Encounter: Payer: Self-pay | Admitting: Surgery

## 2022-10-30 VITALS — BP 85/50 | HR 90 | Resp 18 | Ht 74.0 in | Wt 290.0 lb

## 2022-10-30 DIAGNOSIS — I350 Nonrheumatic aortic (valve) stenosis: Secondary | ICD-10-CM

## 2022-10-30 NOTE — Progress Notes (Signed)
Patient ID: Brandon Sanchez, male   DOB: 03-03-1946, 77 y.o.   MRN: [id number]  [facility] VALVE CLINIC       [address]       [place] [address]             [phone]          CARDIOTHORACIC SURGERY CONSULTATION REPORT  PCP is Corliss Parish, MD Referring Provider is Lauree Chandler, MD Primary Cardiologist is Buford Dresser, MD  Reason for consultation: Severe aortic stenosis  HPI:  The patient is a 77 year old gentleman with a history of diabetes, hypertension, hyperlipidemia, paroxysmal atrial fibrillation, prior CVA, asthma, chronic diastolic congestive heart failure, and atrial fibrillation status post ablation in July 2021.  He has been on Eliquis since he had a stroke in April 2021.  He had an echo in 07/2022 showing severe aortic stenosis with a mean gradient of 51 mmHg and a peak gradient of 77 mmHg.  Aortic valve area was 0.48 cm with a dimensionless index of 0.19 and a low stroke-volume index of 19.  Left ventricular ejection fraction was 50 to 55% with mild LVH.  He started workup for TAVR and cardiac catheterization on 08/29/2022 showed high-grade mid LAD stenosis.  PCI was not performed at that time due to chronic kidney disease and he was seen back by Dr. Angelena Form for discussion of staged PCI but was having hematuria.  This was followed up by urology and it was felt to be due to his kidney stones.  He subsequently underwent atherectomy and stenting of the LAD lesion on 10/04/2022 with excellent results.  He was having shortness of breath and substernal chest discomfort prior to the stenting procedure but has felt much better since.  He still has some exertional fatigue and shortness of breath. He denies any dizziness or syncope.  He has had lower extremity edema.  He denies orthopnea.  He is a retired Counsellor and lives alone in [place].  He has 1 daughter in [place] who is  conferenced in on our appointment today.  Past Medical History:  Diagnosis Date   Allergic rhinitis, cause unspecified    Asthma    Atrial fibrillation (HCC)    CAD (coronary artery disease)    Cataract cortical, senile, bilateral    Macular degeneration of left eye    Other and unspecified hyperlipidemia    Type II or unspecified type diabetes mellitus without mention of complication, not stated as uncontrolled    Unspecified essential hypertension     Past Surgical History:  Procedure Laterality Date   ATRIAL FIBRILLATION ABLATION N/A 03/17/2020   Procedure: ATRIAL FIBRILLATION ABLATION;  Surgeon: Constance Haw, MD;  Location: [facility];  Service: Cardiovascular;  Laterality: N/A;   CARDIOVERSION N/A 09/22/2019   Procedure: CARDIOVERSION;  Surgeon: Donato Heinz, MD;  Location: [facility];  Service: Endoscopy;  Laterality: N/A;   CARDIOVERSION N/A 10/18/2019   Procedure: CARDIOVERSION;  Surgeon: Jerline Pain, MD;  Location: [facility];  Service: Cardiovascular;  Laterality: N/A;   CATARACT EXTRACTION, BILATERAL  08/2020   CORONARY ATHERECTOMY N/A 10/04/2022   Procedure: CORONARY ATHERECTOMY;  Surgeon: Burnell Blanks, MD;  Location: [facility];  Service: Cardiovascular;  Laterality: N/A;   CORONARY STENT INTERVENTION N/A 10/04/2022   Procedure: CORONARY STENT INTERVENTION;  Surgeon: Burnell Blanks, MD;  Location: [facility];  Service: Cardiovascular;  Laterality: N/A;  RIGHT/LEFT HEART CATH AND CORONARY ANGIOGRAPHY N/A 08/29/2022   Procedure: RIGHT/LEFT HEART CATH AND CORONARY ANGIOGRAPHY;  Surgeon: Burnell Blanks, MD;  Location: [facility];  Service: Cardiovascular;  Laterality: N/A;   TEE WITHOUT CARDIOVERSION N/A 09/22/2019   Procedure: TRANSESOPHAGEAL ECHOCARDIOGRAM (TEE);  Surgeon: Donato Heinz, MD;  Location: [facility] ENDOSCOPY;  Service: Endoscopy;  Laterality: N/A;   TONSILLECTOMY       Family History  Problem Relation Age of Onset   Colon cancer Father    Heart disease Paternal Grandfather     Social History   Socioeconomic History   Marital status: Divorced    Spouse name: Not on file   Number of children: 1   Years of education: Not on file   Highest education level: Professional school degree (e.g., MD, DDS, DVM, JD)  Occupational History   Occupation: Defense attorney  Tobacco Use   Smoking status: Never   Smokeless tobacco: Never  Vaping Use   Vaping Use: Never used  Substance and Sexual Activity   Alcohol use: Not Currently    Comment: Used to drink 2 L bottle of wine/day. Quit 09/18/19   Drug use: Never   Sexual activity: Not Currently  Other Topics Concern   Not on file  Social History Narrative   Not on file   Social Determinants of Health   Financial Resource Strain: Not on file  Food Insecurity: No Food Insecurity (10/04/2022)   Hunger Vital Sign    Worried About Running Out of Food in the Last Year: Never true    Ran Out of Food in the Last Year: Never true  Transportation Needs: No Transportation Needs (10/04/2022)   PRAPARE - Hydrologist (Medical): No    Lack of Transportation (Non-Medical): No  Physical Activity: Not on file  Stress: Not on file  Social Connections: Not on file  Intimate Partner Violence: Not At Risk (10/04/2022)   Humiliation, Afraid, Rape, and Kick questionnaire    Fear of Current or Ex-Partner: No    Emotionally Abused: No    Physically Abused: No    Sexually Abused: No    Prior to Admission medications   Medication Sig Start Date End Date Taking? Authorizing Provider  aspirin EC 81 MG EC tablet Take 1 tablet (81 mg total) by mouth daily. 12/11/19   Lavina Hamman, MD  atorvastatin (LIPITOR) 80 MG tablet Take 1 tablet (80 mg total) by mouth every evening. 12/17/19   Love, Ivan Anchors, PA-C  B-D ULTRAFINE III SHORT PEN 31G X 8 MM MISC SMARTSIG:Pre-Filled Pen Syringe SUB-Q 3 Times  Daily 04/05/20   [provider]  Cholecalciferol (VITAMIN D3) 50 MCG (2000 UT) TABS Take 2,000 Units by mouth daily.    [provider]  clopidogrel (PLAVIX) 75 MG tablet Take 1 tablet (75 mg total) by mouth daily. 10/05/22   Kathlen Mody, Scott T, PA-C  ELIQUIS 5 MG TABS tablet TAKE 1 TABLET(5 MG) BY MOUTH TWICE DAILY 12/24/21   Buford Dresser, MD  fluticasone-salmeterol (ADVAIR) 250-50 MCG/ACT AEPB INHALE 1 PUFF INTO THE LUNGS IN THE MORNING AND AT BEDTIME 06/07/22   Margaretha Seeds, MD  furosemide (LASIX) 40 MG tablet Take 1 tablet (40 mg total) by mouth daily. 02/09/20   Buford Dresser, MD  Glucosamine-Chondroit-Vit C-Mn (GLUCOSAMINE 1500 COMPLEX PO) Take 1,500 mg by mouth 2 (two) times daily.    [provider]  metoprolol succinate (TOPROL-XL) 25 MG 24 hr tablet Take 1  tablet (25 mg total) by mouth daily. 10/06/22 10/06/23  Richardson Dopp T, PA-C  Multiple Vitamin (MULTIVITAMIN WITH MINERALS) TABS tablet Take 1 tablet by mouth daily.    [provider]  MYRBETRIQ 25 MG TB24 tablet Take 25 mg by mouth daily. 01/06/20   [provider]  NOVOLOG MIX 70/30 FLEXPEN (70-30) 100 UNIT/ML FlexPen Inject 30-60 Units into the skin 2 (two) times daily as needed (high blood glucose). Sliding Scale 05/23/20   [provider]  [facility] VERIO test strip 1 each by Other route in the morning, at noon, and at bedtime.  07/26/19   [provider]  polyethylene glycol (MIRALAX / GLYCOLAX) 17 g packet Take 17 g by mouth daily. 12/11/19   Lavina Hamman, MD  sertraline (ZOLOFT) 100 MG tablet Take 100 mg by mouth daily.    [provider]    Current Outpatient Medications  Medication Sig Dispense Refill   aspirin EC 81 MG EC tablet Take 1 tablet (81 mg total) by mouth daily. 60 tablet 0   atorvastatin (LIPITOR) 80 MG tablet Take 1 tablet (80 mg total) by mouth every evening. 30 tablet 0   B-D ULTRAFINE III SHORT PEN 31G X 8 MM MISC  SMARTSIG:Pre-Filled Pen Syringe SUB-Q 3 Times Daily     Cholecalciferol (VITAMIN D3) 50 MCG (2000 UT) TABS Take 2,000 Units by mouth daily.     clopidogrel (PLAVIX) 75 MG tablet Take 1 tablet (75 mg total) by mouth daily. 90 tablet 3   ELIQUIS 5 MG TABS tablet TAKE 1 TABLET(5 MG) BY MOUTH TWICE DAILY 180 tablet 2   fluticasone-salmeterol (ADVAIR) 250-50 MCG/ACT AEPB INHALE 1 PUFF INTO THE LUNGS IN THE MORNING AND AT BEDTIME 60 each 11   furosemide (LASIX) 40 MG tablet Take 1 tablet (40 mg total) by mouth daily. 90 tablet 3   Glucosamine-Chondroit-Vit C-Mn (GLUCOSAMINE 1500 COMPLEX PO) Take 1,500 mg by mouth 2 (two) times daily.     metoprolol succinate (TOPROL-XL) 25 MG 24 hr tablet Take 1 tablet (25 mg total) by mouth daily. 90 tablet 3   Multiple Vitamin (MULTIVITAMIN WITH MINERALS) TABS tablet Take 1 tablet by mouth daily.     MYRBETRIQ 25 MG TB24 tablet Take 25 mg by mouth daily.     NOVOLOG MIX 70/30 FLEXPEN (70-30) 100 UNIT/ML FlexPen Inject 30-60 Units into the skin 2 (two) times daily as needed (high blood glucose). Sliding Scale     ONETOUCH VERIO test strip 1 each by Other route in the morning, at noon, and at bedtime.      polyethylene glycol (MIRALAX / GLYCOLAX) 17 g packet Take 17 g by mouth daily. 14 each 0   sertraline (ZOLOFT) 100 MG tablet Take 100 mg by mouth daily.     No current facility-administered medications for this visit.    No Known Allergies    Review of Systems:   General:  normal appetite, + decreased energy, no weight gain, + weight loss, no fever  Cardiac:  + chest pain with exertion prior to PCI, no chest pain at rest, +SOB with moderate exertion, no resting SOB, no PND, no orthopnea, no palpitations, + arrhythmia, + atrial fibrillation, + LE edema, no dizzy spells, no syncope  Respiratory:  + exertional shortness of breath, no home oxygen, no productive cough, no dry cough, no bronchitis, no wheezing, no hemoptysis, + asthma, no pain with inspiration or  cough, no sleep apnea, no CPAP at night  GI:   no  difficulty swallowing, no reflux, no frequent heartburn, no hiatal hernia, no abdominal pain, + constipation, no diarrhea, no hematochezia, no hematemesis, no melena  GU:   no dysuria,  + frequency, no urinary tract infection, + hematuria, no enlarged prostate, + kidney stones, + chronic kidney disease  Vascular:  no pain suggestive of claudication, no pain in feet, no leg cramps, no varicose veins, no DVT, no non-healing foot ulcer  Neuro:   + stroke, no TIA's, no seizures, no headaches, no temporary blindness one eye,  no slurred speech, no peripheral neuropathy, no chronic pain, no instability of gait, no memory/cognitive dysfunction  Musculoskeletal: + arthritis - primarily involving the knees, no joint swelling, no myalgias, no difficulty walking, normal mobility   Skin: no rash, no itching, no skin infections, no pressure sores or ulcerations  Psych:   no anxiety, no depression, no nervousness, no unusual recent stress  Eyes:   + blurry vision, + floaters, no recent vision changes, + wears glasses  ENT:   no hearing loss, no loose or painful teeth, no dentures, last saw dentist 2024  Hematologic:  no easy bruising, no abnormal bleeding, no clotting disorder, no frequent epistaxis  Endocrine:  + diabetes, does check CBG's at home     Physical Exam:   BP (!) 85/50   Pulse 90   Resp 18   Ht 6\' 2"  (1.88 m)   Wt 290 lb (131.5 kg)   SpO2 96% Comment: RA  BMI 37.23 kg/m   General:  Obese,  well-appearing  HEENT:  Unremarkable, NCAT, PERLA, EOMI  Neck:   no JVD, no bruits, no adenopathy   Chest:   clear to auscultation, symmetrical breath sounds, no wheezes, no rhonchi   CV:   RRR, 3/6 systolic murmur RSB, no diastolic murmur  Abdomen:  soft, non-tender, no masses   Extremities:  warm, well-perfused, pedal pulses palpable, + lower extremity edema  Rectal/GU  Deferred  Neuro:   Grossly non-focal and symmetrical throughout  Skin:   Clean  and dry, no rashes, no breakdown  Diagnostic Tests:  ECHOCARDIOGRAM REPORT       Patient Name:   JOVANNI SHAWHAN Date of Exam: 07/31/2022  Medical Rec #:  [id number]        Height:       74.0 in  Accession #:    [id number]       Weight:       300.0 lb  Date of Birth:  Nov 22, 1945        BSA:          2.582 m  Patient Age:    6 years         BP:           130/80 mmHg  Patient Gender: M                HR:           119 bpm.  Exam Location:  Outpatient   Procedure: 2D Echo, 3D Echo, Color Doppler, Cardiac Doppler, Intracardiac             Opacification Agent and Strain Analysis   Indications:    Nonrheumatic aortic valve stenosis    History:        Patient has prior history of Echocardiogram examinations,  most                 recent 12/04/2019. Stroke, Arrythmias:Atrial Fibrillation,  Signs/Symptoms:Shortness of Breath; Risk  Factors:Hypertension,                 Diabetes, Dyslipidemia and Non-Smoker. Cerebral embolism  with                 cerebral infarction.    Sonographer:    Leavy Cella RDCS  Referring Phys: [id number] BRIDGETTE CHRISTOPHER     Sonographer Comments: Post definity 140/80  IMPRESSIONS     1. Left ventricular ejection fraction, by estimation, is 50 to 55%. The  left ventricle has low normal function. The left ventricle has no regional  wall motion abnormalities. There is mild left ventricular hypertrophy.  Left ventricular diastolic  parameters are consistent with Grade III diastolic dysfunction  (restrictive).   2. Right ventricular systolic function is normal. The right ventricular  size is normal. Tricuspid regurgitation signal is inadequate for assessing  PA pressure.   3. The mitral valve is degenerative. No evidence of mitral valve  regurgitation. Moderate mitral annular calcification.   4. The aortic valve is calcified. Aortic valve regurgitation is mild.  Severe aortic valve stenosis.   5. There is borderline dilatation of  the ascending aorta, measuring 39  mm.   6. The inferior vena cava is normal in size with greater than 50%  respiratory variability, suggesting right atrial pressure of 3 mmHg.   Conclusion(s)/Recommendation(s): Compared to prior TTE, AS gradient has  increased, it is now severe.   FINDINGS   Left Ventricle: Left ventricular ejection fraction, by estimation, is 50  to 55%. The left ventricle has low normal function. The left ventricle has  no regional wall motion abnormalities. Definity contrast agent was given  IV to delineate the left  ventricular endocardial borders. The left ventricular internal cavity size  was normal in size. There is mild left ventricular hypertrophy. Left  ventricular diastolic parameters are consistent with Grade III diastolic  dysfunction (restrictive).   Right Ventricle: The right ventricular size is normal. Right ventricular  systolic function is normal. Tricuspid regurgitation signal is inadequate  for assessing PA pressure.   Left Atrium: Left atrial size was normal in size.   Right Atrium: Right atrial size was normal in size.   Pericardium: There is no evidence of pericardial effusion.   Mitral Valve: The mitral valve is degenerative in appearance. There is  moderate thickening of the mitral valve leaflet(s). Moderate mitral  annular calcification. No evidence of mitral valve regurgitation. The mean  mitral valve gradient is 11.0 mmHg.   Tricuspid Valve: Tricuspid valve regurgitation is not demonstrated.   Aortic Valve: The aortic valve is calcified. Aortic valve regurgitation is  mild. Severe aortic stenosis is present. Aortic valve mean gradient  measures 51.0 mmHg. Aortic valve peak gradient measures 77.1 mmHg. Aortic  valve area, by VTI measures 0.59 cm.   Pulmonic Valve: Pulmonic valve regurgitation is not visualized.   Aorta: There is borderline dilatation of the ascending aorta, measuring 39  mm.   Venous: The inferior vena cava is  normal in size with greater than 50%  respiratory variability, suggesting right atrial pressure of 3 mmHg.   IAS/Shunts: No atrial level shunt detected by color flow Doppler.     LEFT VENTRICLE  PLAX 2D  LVIDd:         3.90 cm   Diastology  LVIDs:         2.89 cm   LV e' medial:    8.27 cm/s  LV PW:  2.10 cm   LV E/e' medial:  20.2  LV IVS:        1.89 cm   LV e' lateral:   11.00 cm/s  LVOT diam:     2.00 cm   LV E/e' lateral: 15.2  LV SV:         50  LV SV Index:   19  LVOT Area:     3.14 cm     RIGHT VENTRICLE  RV Basal diam:  3.78 cm  RV Mid diam:    3.14 cm  RV S prime:     12.90 cm/s  TAPSE (M-mode): 1.8 cm   LEFT ATRIUM             Index        RIGHT ATRIUM           Index  LA diam:        4.00 cm 1.55 cm/m   RA Area:     24.90 cm  LA Vol (A2C):   59.5 ml 23.04 ml/m  RA Volume:   78.10 ml  30.25 ml/m  LA Vol (A4C):   45.4 ml 17.58 ml/m  LA Biplane Vol: 52.1 ml 20.18 ml/m   AORTIC VALVE  AV Area (Vmax):    0.58 cm  AV Area (Vmean):   0.48 cm  AV Area (VTI):     0.59 cm  AV Vmax:           439.00 cm/s  AV Vmean:          340.000 cm/s  AV VTI:            0.839 m  AV Peak Grad:      77.1 mmHg  AV Mean Grad:      51.0 mmHg  LVOT Vmax:         81.30 cm/s  LVOT Vmean:        52.200 cm/s  LVOT VTI:          0.158 m  LVOT/AV VTI ratio: 0.19    AORTA  Ao Root diam: 2.90 cm  Ao Asc diam:  3.90 cm   MITRAL VALVE  MV Area (PHT): 3.12 cm     SHUNTS  MV Mean grad:  11.0 mmHg    Systemic VTI:  0.16 m  MV Decel Time: 243 msec     Systemic Diam: 2.00 cm  MV E velocity: 167.00 cm/s  MV A velocity: 68.70 cm/s  MV E/A ratio:  2.43   Publishing rights manager signed by Phineas Inches  Signature Date/Time: 07/31/2022/11:36:06 AM        Final      Physicians  Panel Physicians Referring Physician Case Authorizing Physician  Burnell Blanks, MD (Primary)     Procedures  RIGHT/LEFT HEART CATH AND CORONARY ANGIOGRAPHY   Conclusion       Prox RCA to Mid RCA lesion is 30% stenosed.   Mid LAD lesion is 80% stenosed.   The LAD and Circumflex arise from different ostia.  Severe mid LAD stenosis The Circumflex is a large co-dominant vessel with no obvious disease based on non-selective angiography.  (Unable to selectively engage the Circumflex despite the use of multiple catheters).  The RCA is a moderate caliber co-dominant vessel with mild mid vessel stenosis.  Severe aortic stenosis (mean gradient 45.5 mmHg, peak to peak gradient 62.8 mmHg, AVA 0.65 cm2).    Recommendations: Will plan to stage PCI of the LAD. I will see  him back in the office 1-2 weeks from now to recheck his renal function and review the PCI in detail. Will continue workup for TAVR after his PCI of the LAD. I will load his anti-platelet agent when he is seen in the office.    Indications  Severe aortic stenosis [I35.0 (ICD-10-CM)]  Coronary artery disease involving native coronary artery of native heart without angina pectoris [I25.10 (ICD-10-CM)]   Procedural Details  Technical Details Indication: 77 yo male with history of chronic diastolic CHF, asthma, DM, HTN, hyperlipidemia, paroxysmal atrial fibrillation, prior CVA and severe aortic stenosis who is undergoing workup for TAVR.   Procedure: The risks, benefits, complications, treatment options, and expected outcomes were discussed with the patient. The patient and/or family concurred with the proposed plan, giving informed consent. The patient was sedated with Versed and Fentanyl. The IV catheter in the right wrist was changed for a 5 French sheath. Right heart catheterization performed with a balloon tipped catheter. The right wrist was prepped and draped in a sterile fashion. 1% lidocaine was used for local anesthesia. Using the modified Seldinger access technique, a 5 French sheath was placed in the right radial artery using u/s guidance. 3 mg Verapamil was given through the sheath. Weight based IV heparin  was given. Standard diagnostic catheters were used to perform selective coronary angiography. LV pressures measured with the JR4 catheter. All catheter exchanges were performed over an exchange length guidewire.   The sheath was removed from the right radial artery and a hemostasis band was applied at the arteriotomy site on the right wrist.      Estimated blood loss <50 mL.   During this procedure medications were administered to achieve and maintain moderate conscious sedation while the patient's heart rate, blood pressure, and oxygen saturation were continuously monitored and I was present face-to-face 100% of this time.   Medications (Filter: Administrations occurring from 1027 to 1140 on 08/29/22)  important  Continuous medications are totaled by the amount administered until 08/29/22 1140.   fentaNYL (SUBLIMAZE) injection (mcg)  Total dose: 50 mcg Date/Time Rate/Dose/Volume Action   08/29/22 1038 50 mcg Given   midazolam (VERSED) injection (mg)  Total dose: 2 mg Date/Time Rate/Dose/Volume Action   08/29/22 1038 2 mg Given   Heparin (Porcine) in NaCl 1000-0.9 UT/500ML-% SOLN (mL)  Total volume: 1,000 mL Date/Time Rate/Dose/Volume Action   08/29/22 1050 500 mL Given   1050 500 mL Given   lidocaine (PF) (XYLOCAINE) 1 % injection (mL)  Total volume: 4 mL Date/Time Rate/Dose/Volume Action   08/29/22 1052 2 mL Given   1055 2 mL Given   Radial Cocktail/Verapamil only (mL)  Total volume: 10 mL Date/Time Rate/Dose/Volume Action   08/29/22 1057 10 mL Given   heparin sodium (porcine) injection (Units)  Total dose: 6,000 Units Date/Time Rate/Dose/Volume Action   08/29/22 1104 6,000 Units Given    Sedation Time  Sedation Time Physician-1: 49 minutes 24 seconds Radiation/Fluoro  Fluoro time: 14.9 (min) DAP: 90.6 (Gycm2) Cumulative Air Kerma: [id number] (mGy) Complications  Complications documented before study signed (08/29/2022 [id number] AM)   No complications were associated with  this study.  Documented by Biagio Quint, RT - 08/29/2022 11:41 AM     Coronary Findings  Diagnostic Dominance: Co-dominant Left Anterior Descending  Vessel is large.  Mid LAD lesion is 80% stenosed.    First Diagonal Branch  Vessel is moderate in size.    Second Diagonal Branch  Vessel is small in size.    Left  Circumflex  Vessel is large.    Right Coronary Artery  Prox RCA to Mid RCA lesion is 30% stenosed.    Intervention   No interventions have been documented.   Coronary Diagrams  Diagnostic Dominance: Co-dominant  Intervention   Implants   No implant documentation for this case.   Syngo Images   Show images for CARDIAC CATHETERIZATION Images on Long Term Storage   Show images for [place], [place] to Procedure Log  Procedure Log    Hemo Data  Flowsheet Row Most Recent Value  Fick Cardiac Output 6.17 L/min  Fick Cardiac Output Index 2.39 (L/min)/BSA  Aortic Mean Gradient 45.54 mmHg  Aortic Peak Gradient 62.8 mmHg  Aortic Valve Area 0.65  Aortic Value Area Index 0.25 cm2/BSA  RA A Wave 11 mmHg  RA V Wave 13 mmHg  RA Mean 11 mmHg  RV Systolic Pressure 40 mmHg  RV Diastolic Pressure 3 mmHg  RV EDP 7 mmHg  PA Systolic Pressure 41 mmHg  PA Diastolic Pressure 25 mmHg  PA Mean 34 mmHg  PW A Wave 20 mmHg  PW V Wave 19 mmHg  PW Mean 23 mmHg  AO Systolic Pressure [id number] mmHg  AO Diastolic Pressure 75 mmHg  AO Mean 89 mmHg  LV Systolic Pressure [id number] mmHg  LV Diastolic Pressure 4 mmHg  LV EDP 16 mmHg  AOp Systolic Pressure 87 mmHg  AOp Diastolic Pressure 61 mmHg  AOp Mean Pressure 72 mmHg  LVp Systolic Pressure [id number] mmHg  LVp Diastolic Pressure 7 mmHg  LVp EDP Pressure 7 mmHg  QP/QS 1  TPVR Index 14.23 HRUI  TSVR Index 33.48 HRUI  PVR SVR Ratio 0.16  TPVR/TSVR Ratio 0.42     ADDENDUM REPORT: 10/21/2022 19:54   CLINICAL DATA:  Severe Aortic Stenosis.   EXAM: Cardiac TAVR CT   TECHNIQUE: A non-contrast, gated CT scan was obtained  with axial slices of 3 mm through the heart for aortic valve calcium scoring. A 120 kV retrospective, gated, contrast cardiac scan was obtained. Gantry rotation speed was 250 msecs and collimation was 0.6 mm. Nitroglycerin was not given. The 3D data set was reconstructed in 5% intervals of the 0-95% of the R-R cycle. Systolic and diastolic phases were analyzed on a dedicated workstation using MPR, MIP, and VRT modes. The patient received 100 cc of contrast.   FINDINGS: Image quality: Excellent.   Noise artifact is: Limited.   Valve Morphology: Tricuspid aortic valve. Severely calcified leaflets. Bulky calcification of the LCC. Restricted movement in systole.   Aortic Valve Calcium score: 3101   Aortic annular dimension:   Phase assessed: 10%   Annular area: 563 mm2   Annular perimeter: 85.4 mm   Max diameter: 28.3 mm   Min diameter: 25.7 mm   Annular and subannular calcification: Mild annular calcium under the [place].   Membranous septum length: 10.2 mm   Optimal coplanar projection: RAO 3 CRA 2   Coronary Artery Height above Annulus:   Left Main: 16.6 mm   Right Coronary: 18.1 mm   Sinus of Valsalva Measurements:   Non-coronary: 32.5 mm   Right-coronary: 31 mm   Left-coronary: 35.7 mm   Sinus of Valsalva Height:   Non-coronary: 23.7 mm   Right-coronary: 23.9 mm   Left-coronary: 24.8 mm   Sinotubular Junction: 30 mm   Ascending Thoracic Aorta: 37 mm   Coronary Arteries: Normal coronary origin. Co-dominance. The study was performed without use of NTG and is insufficient for plaque evaluation. Please  refer to recent cardiac catheterization for coronary assessment. LAD stent noted. 3-vessel coronary calcifications.   Cardiac Morphology:   Right Atrium: Right atrial size is within normal limits.   Right Ventricle: The right ventricular cavity is within normal limits.   Left Atrium: Left atrial size is normal in size with no left atrial appendage  filling defect.   Left Ventricle: The ventricular cavity size is within normal limits.   Pulmonary arteries: Dilated pulmonary artery suggestive of pulmonary hypertension.   Pulmonary veins: Normal pulmonary venous drainage.   Pericardium: Normal thickness with no significant effusion or calcium present.   Mitral Valve: The mitral valve is normal structure without significant calcification.   Extra-cardiac findings: See attached radiology report for non-cardiac structures.   IMPRESSION: 1. Tricuspid aortic valve. Severe aortic stenosis. Annular measurements favor a 29 mm S3 (563 mm2).   2. Mild annular calcium under the [place].   3. Sufficient coronary to annulus distance.   4. Optimal Fluoroscopic Angle for Delivery: RAO 3 CRA 2   5. Dilated pulmonary artery suggestive of pulmonary hypertension.   Lake Bells T. Audie Box, MD     Electronically Signed   By: Eleonore Chiquito M.D.   On: 10/21/2022 19:54        Narrative & Impression  CLINICAL DATA:  Preop evaluation for aortic valve replacement   EXAM: CT ANGIOGRAPHY CHEST, ABDOMEN AND PELVIS   TECHNIQUE: Non-contrast CT of the chest was initially obtained.   Multidetector CT imaging through the chest, abdomen and pelvis was performed using the standard protocol during bolus administration of intravenous contrast. Multiplanar reconstructed images and MIPs were obtained and reviewed to evaluate the vascular anatomy.   RADIATION DOSE REDUCTION: This exam was performed according to the departmental dose-optimization program which includes automated exposure control, adjustment of the mA and/or kV according to patient size and/or use of iterative reconstruction technique.   CONTRAST:  57mL OMNIPAQUE IOHEXOL 350 MG/ML SOLN   COMPARISON:  None Available.   FINDINGS: CTA CHEST FINDINGS   Cardiovascular: Cardiomegaly. No pericardial effusion. Aortic valve thickening and calcifications. Mitral annular calcifications.  Normal caliber thoracic aorta with moderate atherosclerotic disease. Standard three-vessel aortic arch with no significant stenosis. Left main and 3 vessel coronary artery disease.   Mediastinum/Nodes: Esophagus and thyroid are unremarkable. No enlarged lymph nodes seen in the chest.   Lungs/Pleura: Central airways are patent. No consolidation, pleural effusion or pneumothorax.   Musculoskeletal: No chest wall abnormality. No acute or significant osseous findings.   CTA ABDOMEN AND PELVIS FINDINGS   Hepatobiliary: No focal liver abnormality is seen. No gallstones, gallbladder wall thickening, or biliary dilatation.   Pancreas: Unremarkable. No pancreatic ductal dilatation or surrounding inflammatory changes.   Spleen: Normal in size without focal abnormality.   Adrenals/Urinary Tract: Bilateral adrenal glands are unremarkable. No hydronephrosis. Bilateral nonobstructing renal stones. Bladder is decompressed.   Stomach/Bowel: Stomach is within normal limits. Appendix appears normal. Mild diverticulosis. No evidence of bowel wall thickening, distention, or inflammatory changes.   Vascular/lymphatic: Normal caliber thoracic aorta with severe calcified and noncalcified plaque. No pathologically enlarged lymph nodes seen in the chest.   Reproductive: Prostate is unremarkable.   Other: Soft tissue stranding of the subcutaneous soft tissues of the right lower anterior abdominal wall, likely related to subcutaneous injections. No intraperitoneal free fluid or free air. Small bilateral fat containing inguinal hernias.   Musculoskeletal: No acute or significant osseous findings.   VASCULAR MEASUREMENTS PERTINENT TO TAVR:   AORTA:   Minimal Aortic Diameter -  14.3 mm   Severity of Aortic Calcification-severe   RIGHT PELVIS:   Right Common Iliac Artery -   Minimal Diameter-14.3 mm   Tortuosity-mild   Calcification-severe   Right External Iliac Artery -   Minimal  Diameter-9.5 mm   Tortuosity-moderate   Calcification-mild   Right Common Femoral Artery -   Minimal Diameter-8.4 mm   Tortuosity-mild   Calcification-severe   LEFT PELVIS:   Left Common Iliac Artery -   Minimal Diameter-10.6 mm   Tortuosity-moderate   Calcification-moderate   Left External Iliac Artery -   Minimal Diameter-9.7 mm   Tortuosity-moderate   Calcification-none   Left Common Femoral Artery -   Minimal Diameter-0.2 mm   Tortuosity-mild   Calcification-severe   Review of the MIP images confirms the above findings.   IMPRESSION: 1. Vascular findings and measurements pertinent to potential TAVR procedure, as detailed above. 2. Thickening and calcification of the aortic valve, compatible with reported clinical history of aortic stenosis. 3. 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 Left main and 3 vessel coronary artery disease. 4. Bilateral nonobstructing renal stones.     Electronically Signed   By: Yetta Glassman M.D.   On: 10/21/2022 15:56      Impression:  This 77 year old gentleman has stage D, severe, symptomatic aortic stenosis with NYHA class III symptoms of exertional fatigue and shortness of breath consistent with chronic diastolic congestive heart failure.  I have personally reviewed his 2D echocardiogram, cardiac catheterization, and CTA studies.  His echocardiogram in December 2023 showed a mean gradient across aortic valve of 51 mmHg with a peak of 77.1 mmHg.  Aortic valve area by VTI was 0.59 cm.  There is mild aortic insufficiency.  The mitral valve is degenerative with moderate thickening of the leaflets and moderate mitral annular calcification.  The mean mitral valve gradient was 11 mmHg.  Left ventricular ejection fraction was 50 to 55% with grade 3 diastolic dysfunction.  Cardiac catheterization showed an 80% mid LAD stenosis that was treated successfully with atherectomy and stenting.  I agree that aortic valve  replacement is indicated in this patient for relief of his symptoms and to prevent left ventricular deterioration.  His gated cardiac CTA shows anatomy suitable for TAVR using a 29 mm SAPIEN 3 valve.  Abdominal and pelvic CTA shows adequate pelvic vascular anatomy to allow transfemoral insertion.  The patient and his daughter were counseled at length regarding treatment alternatives for management of severe symptomatic aortic stenosis. The risks and benefits of surgical intervention has been discussed in detail. Long-term prognosis with medical therapy was discussed. Alternative approaches such as conventional surgical aortic valve replacement, transcatheter aortic valve replacement, and palliative medical therapy were compared and contrasted at length. This discussion was placed in the context of the patient's own specific clinical presentation and past medical history. All of their questions have been addressed.   Following the decision to proceed with transcatheter aortic valve replacement, a discussion was held regarding what types of management strategies would be attempted intraoperatively in the event of life-threatening complications, including whether or not the patient would be considered a candidate for the use of cardiopulmonary bypass and/or conversion to open sternotomy for attempted surgical intervention.  I think he would be a candidate for emergent sternotomy to manage any intraoperative complications.  The patient is aware of the fact that transient use of cardiopulmonary bypass may be necessary. The patient has been advised of a variety of complications that might develop including but not limited to risks  of death, stroke, paravalvular leak, aortic dissection or other major vascular complications, aortic annulus rupture, device embolization, cardiac rupture or perforation, mitral regurgitation, acute myocardial infarction, arrhythmia, heart block or bradycardia requiring permanent pacemaker  placement, congestive heart failure, respiratory failure, renal failure, pneumonia, infection, other late complications related to structural valve deterioration or migration, or other complications that might ultimately cause a temporary or permanent loss of functional independence or other long term morbidity. The patient provides full informed consent for the procedure as described and all questions were answered.      Plan:  He will be scheduled for transfemoral TAVR using a SAPIEN 3 valve on 11/26/2022.  He will need to hold Eliquis for 5 days preoperatively.  I spent 60 minutes performing this consultation and > 50% of this time was spent face to face counseling and coordinating the care of this patient's severe symptomatic aortic stenosis.   Gaye Pollack, MD 10/30/2022

## 2022-11-04 DIAGNOSIS — N200 Calculus of kidney: Secondary | ICD-10-CM | POA: Diagnosis not present

## 2022-11-04 DIAGNOSIS — I129 Hypertensive chronic kidney disease with stage 1 through stage 4 chronic kidney disease, or unspecified chronic kidney disease: Secondary | ICD-10-CM | POA: Diagnosis not present

## 2022-11-04 DIAGNOSIS — I2510 Atherosclerotic heart disease of native coronary artery without angina pectoris: Secondary | ICD-10-CM | POA: Diagnosis not present

## 2022-11-04 DIAGNOSIS — E1122 Type 2 diabetes mellitus with diabetic chronic kidney disease: Secondary | ICD-10-CM | POA: Diagnosis not present

## 2022-11-04 DIAGNOSIS — I482 Chronic atrial fibrillation, unspecified: Secondary | ICD-10-CM | POA: Diagnosis not present

## 2022-11-04 DIAGNOSIS — N1831 Chronic kidney disease, stage 3a: Secondary | ICD-10-CM | POA: Diagnosis not present

## 2022-11-04 DIAGNOSIS — N390 Urinary tract infection, site not specified: Secondary | ICD-10-CM | POA: Diagnosis not present

## 2022-11-04 DIAGNOSIS — D631 Anemia in chronic kidney disease: Secondary | ICD-10-CM | POA: Diagnosis not present

## 2022-11-04 DIAGNOSIS — N1832 Chronic kidney disease, stage 3b: Secondary | ICD-10-CM | POA: Diagnosis not present

## 2022-11-05 LAB — LAB REPORT - SCANNED
Albumin, Urine POC: 40.5
Creatinine, POC: 176.7 mg/dL
Microalb Creat Ratio: 23

## 2022-11-15 ENCOUNTER — Other Ambulatory Visit: Payer: Self-pay

## 2022-11-15 DIAGNOSIS — I350 Nonrheumatic aortic (valve) stenosis: Secondary | ICD-10-CM

## 2022-11-21 NOTE — Telephone Encounter (Signed)
Called the patient and reviewed all medication instructions.  Confirmed PAT appointment tomorrow. He understands he will be called if anything needs to be addressed based on results. He was grateful for call and agreed with plan.

## 2022-11-22 ENCOUNTER — Ambulatory Visit (HOSPITAL_COMMUNITY)
Admission: RE | Admit: 2022-11-22 | Discharge: 2022-11-22 | Disposition: A | Discharge status: Home / Self Care | Payer: Medicare Other | Source: Ambulatory Visit | Attending: Cardiovascular Disease | Admitting: Cardiovascular Disease

## 2022-11-22 ENCOUNTER — Encounter (HOSPITAL_COMMUNITY)
Admission: RE | Admit: 2022-11-22 | Discharge: 2022-11-22 | Disposition: A | Discharge status: Home / Self Care | Payer: Medicare Other | Source: Ambulatory Visit | Attending: Cardiovascular Disease | Admitting: Cardiovascular Disease

## 2022-11-22 ENCOUNTER — Other Ambulatory Visit: Payer: Self-pay

## 2022-11-22 ENCOUNTER — Other Ambulatory Visit: Payer: Self-pay | Admitting: Cardiology

## 2022-11-22 DIAGNOSIS — Z1152 Encounter for screening for COVID-19: Secondary | ICD-10-CM | POA: Insufficient documentation

## 2022-11-22 DIAGNOSIS — I350 Nonrheumatic aortic (valve) stenosis: Secondary | ICD-10-CM | POA: Diagnosis not present

## 2022-11-22 DIAGNOSIS — Z01818 Encounter for other preprocedural examination: Secondary | ICD-10-CM | POA: Diagnosis not present

## 2022-11-22 LAB — COMPREHENSIVE METABOLIC PANEL
ALT: 17 U/L (ref 0–44)
AST: 19 U/L (ref 15–41)
Albumin: 3.6 g/dL (ref 3.5–5.0)
Alkaline Phosphatase: 108 U/L (ref 38–126)
Anion gap: 9 (ref 5–15)
BUN: 22 mg/dL (ref 8–23)
CO2: 26 mmol/L (ref 22–32)
Calcium: 8.9 mg/dL (ref 8.9–10.3)
Chloride: 104 mmol/L (ref 98–111)
Creatinine, Ser: 1.31 mg/dL — ABNORMAL HIGH (ref 0.61–1.24)
GFR, Estimated: 57 mL/min — ABNORMAL LOW (ref >60)
Glucose, Bld: 162 mg/dL — ABNORMAL HIGH (ref 70–99)
Potassium: 3.4 mmol/L — ABNORMAL LOW (ref 3.5–5.1)
Sodium: 139 mmol/L (ref 135–145)
Total Bilirubin: 0.9 mg/dL (ref 0.3–1.2)
Total Protein: 6.9 g/dL (ref 6.5–8.1)

## 2022-11-22 LAB — CBC
HCT: 38.9 % — ABNORMAL LOW (ref 39.0–52.0)
Hemoglobin: 12.4 g/dL — ABNORMAL LOW (ref 13.0–17.0)
MCH: 29.7 pg (ref 26.0–34.0)
MCHC: 31.9 g/dL (ref 30.0–36.0)
MCV: 93.3 fL (ref 80.0–100.0)
Platelets: 187 10*3/uL (ref 150–400)
RBC: 4.17 MIL/uL — ABNORMAL LOW (ref 4.22–5.81)
RDW: 14.1 % (ref 11.5–15.5)
WBC: 8.8 10*3/uL (ref 4.0–10.5)
nRBC: 0 % (ref 0.0–0.2)

## 2022-11-22 LAB — TYPE AND SCREEN
ABO/RH(D): O POS
Antibody Screen: NEGATIVE

## 2022-11-22 LAB — URINALYSIS, ROUTINE W REFLEX MICROSCOPIC
Bilirubin Urine: NEGATIVE
Glucose, UA: NEGATIVE mg/dL
Hgb urine dipstick: NEGATIVE
Ketones, ur: NEGATIVE mg/dL
Leukocytes,Ua: NEGATIVE
Nitrite: NEGATIVE
Protein, ur: NEGATIVE mg/dL
Specific Gravity, Urine: 1.011 (ref 1.005–1.030)
pH: 5 (ref 5.0–8.0)

## 2022-11-22 LAB — PROTIME-INR
INR: 1.1 (ref 0.8–1.2)
Prothrombin Time: 13.7 seconds (ref 11.4–15.2)

## 2022-11-22 LAB — SURGICAL PCR SCREEN
MRSA, PCR: NEGATIVE
Staphylococcus aureus: NEGATIVE

## 2022-11-22 MED ORDER — POTASSIUM CHLORIDE CRYS ER 20 MEQ PO TBCR: 20.0000 meq | EXTENDED_RELEASE_TABLET | Freq: Every day | ORAL | 0 refills | Status: DC

## 2022-11-22 NOTE — Progress Notes (Signed)
  [facility]   MULTIDISCIPLINARY HEART VALVE TEAM   Pre-admission testing labs with K+ 3.4. Called the patient and will send Kdur PO QD x 4 days. Will follow BMET while in the hospital for TAVR and make adjustments as needed.    Georgie Chard NP-C Structural Heart Team  Pager: [phone] Phone: [phone]

## 2022-11-22 NOTE — Progress Notes (Signed)
Patient signed all consents at PAT lab appointment. CHG soap and instructions were given to patient. CHG surgical prep reviewed with patient and all questions answered.  Pt denies any respiratory illness/infection in the last two months. 

## 2022-11-23 LAB — SARS CORONAVIRUS 2 (TAT 6-24 HRS): SARS Coronavirus 2: NEGATIVE

## 2022-11-25 NOTE — H&P (Signed)
[address]       [place] [address]             [phone]      Cardiothoracic Surgery Admission History and Physical   PCP is Annie Sable, MD Referring Provider is Verne Carrow, MD Primary Cardiologist is Jodelle Red, MD   Reason for admission: Severe aortic stenosis   HPI:   The patient is a 77 year old gentleman with a history of diabetes, hypertension, hyperlipidemia, paroxysmal atrial fibrillation, prior CVA, asthma, chronic diastolic congestive heart failure, and atrial fibrillation status post ablation in July 2021.  He has been on Eliquis since he had a stroke in April 2021.  He had an echo in 07/2022 showing severe aortic stenosis with a mean gradient of 51 mmHg and a peak gradient of 77 mmHg.  Aortic valve area was 0.48 cm with a dimensionless index of 0.19 and a low stroke-volume index of 19.  Left ventricular ejection fraction was 50 to 55% with mild LVH.  He started workup for TAVR and cardiac catheterization on 08/29/2022 showed high-grade mid LAD stenosis.  PCI was not performed at that time due to chronic kidney disease and he was seen back by Dr. Clifton James for discussion of staged PCI but was having hematuria.  This was followed up by urology and it was felt to be due to his kidney stones.  He subsequently underwent atherectomy and stenting of the LAD lesion on 10/04/2022 with excellent results.  He was having shortness of breath and substernal chest discomfort prior to the stenting procedure but has felt much better since.  He still has some exertional fatigue and shortness of breath. He denies any dizziness or syncope.  He has had lower extremity edema.  He denies orthopnea.   He is a retired Armed forces logistics/support/administrative officer and lives alone in [place].  He has 1 daughter in [place].      Past Medical History:  Diagnosis Date   Allergic rhinitis, cause unspecified     Asthma     Atrial fibrillation (HCC)     CAD (coronary  artery disease)     Cataract cortical, senile, bilateral     Macular degeneration of left eye     Other and unspecified hyperlipidemia     Type II or unspecified type diabetes mellitus without mention of complication, not stated as uncontrolled     Unspecified essential hypertension             Past Surgical History:  Procedure Laterality Date   ATRIAL FIBRILLATION ABLATION N/A 03/17/2020    Procedure: ATRIAL FIBRILLATION ABLATION;  Surgeon: Regan Lemming, MD;  Location: [facility] INVASIVE CV LAB;  Service: Cardiovascular;  Laterality: N/A;   CARDIOVERSION N/A 09/22/2019    Procedure: CARDIOVERSION;  Surgeon: [name], MD;  Location: [facility] ENDOSCOPY;  Service: Endoscopy;  Laterality: N/A;   CARDIOVERSION N/A 10/18/2019    Procedure: CARDIOVERSION;  Surgeon: Jake Bathe, MD;  Location: [facility] ENDOSCOPY;  Service: Cardiovascular;  Laterality: N/A;   CATARACT EXTRACTION, BILATERAL   08/2020   CORONARY ATHERECTOMY N/A 10/04/2022    Procedure: CORONARY ATHERECTOMY;  Surgeon: Kathleene Hazel, MD;  Location: [facility] INVASIVE CV LAB;  Service: Cardiovascular;  Laterality: N/A;   CORONARY STENT INTERVENTION N/A 10/04/2022    Procedure: CORONARY STENT INTERVENTION;  Surgeon: Kathleene Hazel, MD;  Location: [facility];  Service: Cardiovascular;  Laterality: N/A;   RIGHT/LEFT HEART CATH AND CORONARY ANGIOGRAPHY N/A 08/29/2022  Procedure: RIGHT/LEFT HEART CATH AND CORONARY ANGIOGRAPHY;  Surgeon: Kathleene Hazel, MD;  Location: [facility];  Service: Cardiovascular;  Laterality: N/A;   TEE WITHOUT CARDIOVERSION N/A 09/22/2019    Procedure: TRANSESOPHAGEAL ECHOCARDIOGRAM (TEE);  Surgeon: [name], MD;  Location: [facility] ENDOSCOPY;  Service: Endoscopy;  Laterality: N/A;   TONSILLECTOMY               Family History  Problem Relation Age of Onset   Colon cancer Father     Heart disease Paternal Grandfather        Social History          Socioeconomic History   Marital status: Divorced      Spouse name: Not on file   Number of children: 1   Years of education: Not on file   Highest education level: Professional school degree (e.g., MD, DDS, DVM, JD)  Occupational History   Occupation: Defense attorney  Tobacco Use   Smoking status: Never   Smokeless tobacco: Never  Vaping Use   Vaping Use: Never used  Substance and Sexual Activity   Alcohol use: Not Currently      Comment: Used to drink 2 L bottle of wine/day. Quit 09/18/19   Drug use: Never   Sexual activity: Not Currently  Other Topics Concern   Not on file  Social History Narrative   Not on file    Social Determinants of Health        Financial Resource Strain: Not on file  Food Insecurity: No Food Insecurity (10/04/2022)    Hunger Vital Sign     Worried About Running Out of Food in the Last Year: Never true     Ran Out of Food in the Last Year: Never true  Transportation Needs: No Transportation Needs (10/04/2022)    PRAPARE - Therapist, art (Medical): No     Lack of Transportation (Non-Medical): No  Physical Activity: Not on file  Stress: Not on file  Social Connections: Not on file  Intimate Partner Violence: Not At Risk (10/04/2022)    Humiliation, Afraid, Rape, and Kick questionnaire     Fear of Current or Ex-Partner: No     Emotionally Abused: No     Physically Abused: No     Sexually Abused: No             Prior to Admission medications   Medication Sig Start Date End Date Taking? Authorizing Provider  aspirin EC 81 MG EC tablet Take 1 tablet (81 mg total) by mouth daily. 12/11/19     Rolly Salter, MD  atorvastatin (LIPITOR) 80 MG tablet Take 1 tablet (80 mg total) by mouth every evening. 12/17/19     Love, Evlyn Kanner, PA-C  B-D ULTRAFINE III SHORT PEN 31G X 8 MM MISC SMARTSIG:Pre-Filled Pen Syringe SUB-Q 3 Times Daily 04/05/20     [provider]  Cholecalciferol (VITAMIN D3) 50 MCG (2000 UT) TABS Take  2,000 Units by mouth daily.       [provider]  clopidogrel (PLAVIX) 75 MG tablet Take 1 tablet (75 mg total) by mouth daily. 10/05/22     Tereso Newcomer T, PA-C  ELIQUIS 5 MG TABS tablet TAKE 1 TABLET(5 MG) BY MOUTH TWICE DAILY 12/24/21     Jodelle Red, MD  fluticasone-salmeterol (ADVAIR) 250-50 MCG/ACT AEPB INHALE 1 PUFF INTO THE LUNGS IN THE MORNING AND AT BEDTIME 06/07/22     Luciano Cutter,  MD  furosemide (LASIX) 40 MG tablet Take 1 tablet (40 mg total) by mouth daily. 02/09/20     Jodelle Red, MD  Glucosamine-Chondroit-Vit C-Mn (GLUCOSAMINE 1500 COMPLEX PO) Take 1,500 mg by mouth 2 (two) times daily.       [provider]  metoprolol succinate (TOPROL-XL) 25 MG 24 hr tablet Take 1 tablet (25 mg total) by mouth daily. 10/06/22 10/06/23   Tereso Newcomer T, PA-C  Multiple Vitamin (MULTIVITAMIN WITH MINERALS) TABS tablet Take 1 tablet by mouth daily.       [provider]  MYRBETRIQ 25 MG TB24 tablet Take 25 mg by mouth daily. 01/06/20     [provider]  NOVOLOG MIX 70/30 FLEXPEN (70-30) 100 UNIT/ML FlexPen Inject 30-60 Units into the skin 2 (two) times daily as needed (high blood glucose). Sliding Scale 05/23/20     [provider]  [facility] VERIO test strip 1 each by Other route in the morning, at noon, and at bedtime.  07/26/19     [provider]  polyethylene glycol (MIRALAX / GLYCOLAX) 17 g packet Take 17 g by mouth daily. 12/11/19     Rolly Salter, MD  sertraline (ZOLOFT) 100 MG tablet Take 100 mg by mouth daily.       [provider]            Current Outpatient Medications  Medication Sig Dispense Refill   aspirin EC 81 MG EC tablet Take 1 tablet (81 mg total) by mouth daily. 60 tablet 0   atorvastatin (LIPITOR) 80 MG tablet Take 1 tablet (80 mg total) by mouth every evening. 30 tablet 0   B-D ULTRAFINE III SHORT PEN 31G X 8 MM MISC SMARTSIG:Pre-Filled Pen Syringe SUB-Q 3 Times Daily        Cholecalciferol (VITAMIN D3) 50 MCG (2000 UT) TABS Take 2,000 Units by mouth daily.       clopidogrel (PLAVIX) 75 MG tablet Take 1 tablet (75 mg total) by mouth daily. 90 tablet 3   ELIQUIS 5 MG TABS tablet TAKE 1 TABLET(5 MG) BY MOUTH TWICE DAILY 180 tablet 2   fluticasone-salmeterol (ADVAIR) 250-50 MCG/ACT AEPB INHALE 1 PUFF INTO THE LUNGS IN THE MORNING AND AT BEDTIME 60 each 11   furosemide (LASIX) 40 MG tablet Take 1 tablet (40 mg total) by mouth daily. 90 tablet 3   Glucosamine-Chondroit-Vit C-Mn (GLUCOSAMINE 1500 COMPLEX PO) Take 1,500 mg by mouth 2 (two) times daily.       metoprolol succinate (TOPROL-XL) 25 MG 24 hr tablet Take 1 tablet (25 mg total) by mouth daily. 90 tablet 3   Multiple Vitamin (MULTIVITAMIN WITH MINERALS) TABS tablet Take 1 tablet by mouth daily.       MYRBETRIQ 25 MG TB24 tablet Take 25 mg by mouth daily.       NOVOLOG MIX 70/30 FLEXPEN (70-30) 100 UNIT/ML FlexPen Inject 30-60 Units into the skin 2 (two) times daily as needed (high blood glucose). Sliding Scale       ONETOUCH VERIO test strip 1 each by Other route in the morning, at noon, and at bedtime.        polyethylene glycol (MIRALAX / GLYCOLAX) 17 g packet Take 17 g by mouth daily. 14 each 0   sertraline (ZOLOFT) 100 MG tablet Take 100 mg by mouth daily.        No current facility-administered medications for this visit.      No Known Allergies       Review  of Systems:               General:                      normal appetite, + decreased energy, no weight gain, + weight loss, no fever             Cardiac:                       + chest pain with exertion prior to PCI, no chest pain at rest, +SOB with moderate exertion, no resting SOB, no PND, no orthopnea, no palpitations, + arrhythmia, + atrial fibrillation, + LE edema, no dizzy spells, no syncope             Respiratory:                 + exertional shortness of breath, no home oxygen, no productive cough, no dry cough, no bronchitis, no wheezing, no  hemoptysis, + asthma, no pain with inspiration or cough, no sleep apnea, no CPAP at night             GI:                               no difficulty swallowing, no reflux, no frequent heartburn, no hiatal hernia, no abdominal pain, + constipation, no diarrhea, no hematochezia, no hematemesis, no melena             GU:                              no dysuria,  + frequency, no urinary tract infection, + hematuria, no enlarged prostate, + kidney stones, + chronic kidney disease             Vascular:                     no pain suggestive of claudication, no pain in feet, no leg cramps, no varicose veins, no DVT, no non-healing foot ulcer             Neuro:                         + stroke, no TIA's, no seizures, no headaches, no temporary blindness one eye,  no slurred speech, no peripheral neuropathy, no chronic pain, no instability of gait, no memory/cognitive dysfunction             Musculoskeletal:         + arthritis - primarily involving the knees, no joint swelling, no myalgias, no difficulty walking, normal mobility              Skin:    no rash, no itching, no skin infections, no pressure sores or ulcerations             Psych:                         no anxiety, no depression, no nervousness, no unusual recent stress             Eyes:                           + blurry vision, + floaters, no  recent vision changes, + wears glasses             ENT:                            no hearing loss, no loose or painful teeth, no dentures, last saw dentist 2024             Hematologic:               no easy bruising, no abnormal bleeding, no clotting disorder, no frequent epistaxis             Endocrine:                   + diabetes, does check CBG's at home                            Physical Exam:               BP (!) 85/50   Pulse 90   Resp 18   Ht 6\' 2"  (1.88 m)   Wt 290 lb (131.5 kg)   SpO2 96% Comment: RA  BMI 37.23 kg/m              General:                      Obese,  well-appearing              HEENT:                       Unremarkable, NCAT, PERLA, EOMI             Neck:                           no JVD, no bruits, no adenopathy              Chest:                          clear to auscultation, symmetrical breath sounds, no wheezes, no rhonchi              CV:                              RRR, 3/6 systolic murmur RSB, no diastolic murmur             Abdomen:                    soft, non-tender, no masses              Extremities:                 warm, well-perfused, pedal pulses palpable, + lower extremity edema             Rectal/GU                   Deferred             Neuro:                         Grossly non-focal and symmetrical throughout             Skin:  Clean and dry, no rashes, no breakdown   Diagnostic Tests:   ECHOCARDIOGRAM REPORT       Patient Name:   Leroy KennedyWILLIAM C Bloodworth Date of Exam: 07/31/2022  Medical Rec #:  [id number]        Height:       74.0 in  Accession #:    [id number]       Weight:       300.0 lb  Date of Birth:  [birth year]        BSA:          2.582 m  Patient Age:    76 years         BP:           130/80 mmHg  Patient Gender: M                HR:           119 bpm.  Exam Location:  Outpatient   Procedure: 2D Echo, 3D Echo, Color Doppler, Cardiac Doppler, Intracardiac             Opacification Agent and Strain Analysis   Indications:    Nonrheumatic aortic valve stenosis    History:        Patient has prior history of Echocardiogram examinations,  most                 recent 12/04/2019. Stroke, Arrythmias:Atrial Fibrillation,                  Signs/Symptoms:Shortness of Breath; Risk  Factors:Hypertension,                 Diabetes, Dyslipidemia and Non-Smoker. Cerebral embolism  with                 cerebral infarction.    Sonographer:    Jeryl ColumbiaJohanna Elliott RDCS  Referring Phys: [id number] BRIDGETTE CHRISTOPHER     Sonographer Comments: Post definity 140/80  IMPRESSIONS     1. Left ventricular ejection  fraction, by estimation, is 50 to 55%. The  left ventricle has low normal function. The left ventricle has no regional  wall motion abnormalities. There is mild left ventricular hypertrophy.  Left ventricular diastolic  parameters are consistent with Grade III diastolic dysfunction  (restrictive).   2. Right ventricular systolic function is normal. The right ventricular  size is normal. Tricuspid regurgitation signal is inadequate for assessing  PA pressure.   3. The mitral valve is degenerative. No evidence of mitral valve  regurgitation. Moderate mitral annular calcification.   4. The aortic valve is calcified. Aortic valve regurgitation is mild.  Severe aortic valve stenosis.   5. There is borderline dilatation of the ascending aorta, measuring 39  mm.   6. The inferior vena cava is normal in size with greater than 50%  respiratory variability, suggesting right atrial pressure of 3 mmHg.   Conclusion(s)/Recommendation(s): Compared to prior TTE, AS gradient has  increased, it is now severe.   FINDINGS   Left Ventricle: Left ventricular ejection fraction, by estimation, is 50  to 55%. The left ventricle has low normal function. The left ventricle has  no regional wall motion abnormalities. Definity contrast agent was given  IV to delineate the left  ventricular endocardial borders. The left ventricular internal cavity size  was normal in size. There is mild left ventricular hypertrophy. Left  ventricular diastolic parameters are consistent with Grade III diastolic  dysfunction (restrictive).   Right Ventricle: The right  ventricular size is normal. Right ventricular  systolic function is normal. Tricuspid regurgitation signal is inadequate  for assessing PA pressure.   Left Atrium: Left atrial size was normal in size.   Right Atrium: Right atrial size was normal in size.   Pericardium: There is no evidence of pericardial effusion.   Mitral Valve: The mitral valve is  degenerative in appearance. There is  moderate thickening of the mitral valve leaflet(s). Moderate mitral  annular calcification. No evidence of mitral valve regurgitation. The mean  mitral valve gradient is 11.0 mmHg.   Tricuspid Valve: Tricuspid valve regurgitation is not demonstrated.   Aortic Valve: The aortic valve is calcified. Aortic valve regurgitation is  mild. Severe aortic stenosis is present. Aortic valve mean gradient  measures 51.0 mmHg. Aortic valve peak gradient measures 77.1 mmHg. Aortic  valve area, by VTI measures 0.59 cm.   Pulmonic Valve: Pulmonic valve regurgitation is not visualized.   Aorta: There is borderline dilatation of the ascending aorta, measuring 39  mm.   Venous: The inferior vena cava is normal in size with greater than 50%  respiratory variability, suggesting right atrial pressure of 3 mmHg.   IAS/Shunts: No atrial level shunt detected by color flow Doppler.     LEFT VENTRICLE  PLAX 2D  LVIDd:         3.90 cm   Diastology  LVIDs:         2.89 cm   LV e' medial:    8.27 cm/s  LV PW:         2.10 cm   LV E/e' medial:  20.2  LV IVS:        1.89 cm   LV e' lateral:   11.00 cm/s  LVOT diam:     2.00 cm   LV E/e' lateral: 15.2  LV SV:         50  LV SV Index:   19  LVOT Area:     3.14 cm     RIGHT VENTRICLE  RV Basal diam:  3.78 cm  RV Mid diam:    3.14 cm  RV S prime:     12.90 cm/s  TAPSE (M-mode): 1.8 cm   LEFT ATRIUM             Index        RIGHT ATRIUM           Index  LA diam:        4.00 cm 1.55 cm/m   RA Area:     24.90 cm  LA Vol (A2C):   59.5 ml 23.04 ml/m  RA Volume:   78.10 ml  30.25 ml/m  LA Vol (A4C):   45.4 ml 17.58 ml/m  LA Biplane Vol: 52.1 ml 20.18 ml/m   AORTIC VALVE  AV Area (Vmax):    0.58 cm  AV Area (Vmean):   0.48 cm  AV Area (VTI):     0.59 cm  AV Vmax:           439.00 cm/s  AV Vmean:          340.000 cm/s  AV VTI:            0.839 m  AV Peak Grad:      77.1 mmHg  AV Mean Grad:      51.0 mmHg   LVOT Vmax:         81.30 cm/s  LVOT Vmean:        52.200  cm/s  LVOT VTI:          0.158 m  LVOT/AV VTI ratio: 0.19    AORTA  Ao Root diam: 2.90 cm  Ao Asc diam:  3.90 cm   MITRAL VALVE  MV Area (PHT): 3.12 cm     SHUNTS  MV Mean grad:  11.0 mmHg    Systemic VTI:  0.16 m  MV Decel Time: 243 msec     Systemic Diam: 2.00 cm  MV E velocity: 167.00 cm/s  MV A velocity: 68.70 cm/s  MV E/A ratio:  2.43   Actuary signed by Carolan Clines  Signature Date/Time: 07/31/2022/11:36:06 AM        Final        Physicians   Panel Physicians Referring Physician Case Authorizing Physician  Kathleene Hazel, MD (Primary)        Procedures   RIGHT/LEFT HEART CATH AND CORONARY ANGIOGRAPHY    Conclusion       Prox RCA to Mid RCA lesion is 30% stenosed.   Mid LAD lesion is 80% stenosed.   The LAD and Circumflex arise from different ostia.  Severe mid LAD stenosis The Circumflex is a large co-dominant vessel with no obvious disease based on non-selective angiography.  (Unable to selectively engage the Circumflex despite the use of multiple catheters).  The RCA is a moderate caliber co-dominant vessel with mild mid vessel stenosis.  Severe aortic stenosis (mean gradient 45.5 mmHg, peak to peak gradient 62.8 mmHg, AVA 0.65 cm2).    Recommendations: Will plan to stage PCI of the LAD. I will see him back in the office 1-2 weeks from now to recheck his renal function and review the PCI in detail. Will continue workup for TAVR after his PCI of the LAD. I will load his anti-platelet agent when he is seen in the office.    Indications   Severe aortic stenosis [I35.0 (ICD-10-CM)]  Coronary artery disease involving native coronary artery of native heart without angina pectoris [I25.10 (ICD-10-CM)]    Procedural Details   Technical Details Indication: 77 yo male with history of chronic diastolic CHF, asthma, DM, HTN, hyperlipidemia, paroxysmal atrial fibrillation, prior  CVA and severe aortic stenosis who is undergoing workup for TAVR.   Procedure: The risks, benefits, complications, treatment options, and expected outcomes were discussed with the patient. The patient and/or family concurred with the proposed plan, giving informed consent. The patient was sedated with Versed and Fentanyl. The IV catheter in the right wrist was changed for a 5 French sheath. Right heart catheterization performed with a balloon tipped catheter. The right wrist was prepped and draped in a sterile fashion. 1% lidocaine was used for local anesthesia. Using the modified Seldinger access technique, a 5 French sheath was placed in the right radial artery using u/s guidance. 3 mg Verapamil was given through the sheath. Weight based IV heparin was given. Standard diagnostic catheters were used to perform selective coronary angiography. LV pressures measured with the JR4 catheter. All catheter exchanges were performed over an exchange length guidewire.   The sheath was removed from the right radial artery and a hemostasis band was applied at the arteriotomy site on the right wrist.      Estimated blood loss <50 mL.   During this procedure medications were administered to achieve and maintain moderate conscious sedation while the patient's heart rate, blood pressure, and oxygen saturation were continuously monitored and I was present face-to-face 100% of this time.  Medications (Filter: Administrations occurring from 1027 to 1140 on 08/29/22)  important  Continuous medications are totaled by the amount administered until 08/29/22 1140.    fentaNYL (SUBLIMAZE) injection (mcg)  Total dose: 50 mcg Date/Time Rate/Dose/Volume Action    08/29/22 1038 50 mcg Given    midazolam (VERSED) injection (mg)  Total dose: 2 mg Date/Time Rate/Dose/Volume Action    08/29/22 1038 2 mg Given    Heparin (Porcine) in NaCl 1000-0.9 UT/500ML-% SOLN (mL)  Total volume: 1,000 mL Date/Time Rate/Dose/Volume  Action    08/29/22 1050 500 mL Given    1050 500 mL Given    lidocaine (PF) (XYLOCAINE) 1 % injection (mL)  Total volume: 4 mL Date/Time Rate/Dose/Volume Action    08/29/22 1052 2 mL Given    1055 2 mL Given    Radial Cocktail/Verapamil only (mL)  Total volume: 10 mL Date/Time Rate/Dose/Volume Action    08/29/22 1057 10 mL Given    heparin sodium (porcine) injection (Units)  Total dose: 6,000 Units Date/Time Rate/Dose/Volume Action    08/29/22 1104 6,000 Units Given      Sedation Time   Sedation Time Physician-1: 49 minutes 24 seconds Radiation/Fluoro   Fluoro time: 14.9 (min) DAP: 90.6 (Gycm2) Cumulative Air Kerma: 1898 (mGy) Complications   Complications documented before study signed (08/29/2022 11:44 AM)    No complications were associated with this study.  Documented by Earlean Polka, RT - 08/29/2022 11:41 AM      Coronary Findings   Diagnostic Dominance: Co-dominant Left Anterior Descending  Vessel is large.  Mid LAD lesion is 80% stenosed.    First Diagonal Branch  Vessel is moderate in size.    Second Diagonal Branch  Vessel is small in size.    Left Circumflex  Vessel is large.    Right Coronary Artery  Prox RCA to Mid RCA lesion is 30% stenosed.    Intervention    No interventions have been documented.    Coronary Diagrams   Diagnostic Dominance: Co-dominant  Intervention    Implants    No implant documentation for this case.    Syngo Images    Show images for CARDIAC CATHETERIZATION Images on Long Term Storage    Show images for Izaya, Spearin to Procedure Log   Procedure Log    Hemo Data   Flowsheet Row Most Recent Value  Fick Cardiac Output 6.17 L/min  Fick Cardiac Output Index 2.39 (L/min)/BSA  Aortic Mean Gradient 45.54 mmHg  Aortic Peak Gradient 62.8 mmHg  Aortic Valve Area 0.65  Aortic Value Area Index 0.25 cm2/BSA  RA A Wave 11 mmHg  RA V Wave 13 mmHg  RA Mean 11 mmHg  RV Systolic Pressure 40 mmHg  RV  Diastolic Pressure 3 mmHg  RV EDP 7 mmHg  PA Systolic Pressure 41 mmHg  PA Diastolic Pressure 25 mmHg  PA Mean 34 mmHg  PW A Wave 20 mmHg  PW V Wave 19 mmHg  PW Mean 23 mmHg  AO Systolic Pressure 107 mmHg  AO Diastolic Pressure 75 mmHg  AO Mean 89 mmHg  LV Systolic Pressure 142 mmHg  LV Diastolic Pressure 4 mmHg  LV EDP 16 mmHg  AOp Systolic Pressure 87 mmHg  AOp Diastolic Pressure 61 mmHg  AOp Mean Pressure 72 mmHg  LVp Systolic Pressure 145 mmHg  LVp Diastolic Pressure 7 mmHg  LVp EDP Pressure 7 mmHg  QP/QS 1  TPVR Index 14.23 HRUI  TSVR Index 33.48 HRUI  PVR SVR Ratio  0.16  TPVR/TSVR Ratio 0.42      ADDENDUM REPORT: 10/21/2022 19:54   CLINICAL DATA:  Severe Aortic Stenosis.   EXAM: Cardiac TAVR CT   TECHNIQUE: A non-contrast, gated CT scan was obtained with axial slices of 3 mm through the heart for aortic valve calcium scoring. A 120 kV retrospective, gated, contrast cardiac scan was obtained. Gantry rotation speed was 250 msecs and collimation was 0.6 mm. Nitroglycerin was not given. The 3D data set was reconstructed in 5% intervals of the 0-95% of the R-R cycle. Systolic and diastolic phases were analyzed on a dedicated workstation using MPR, MIP, and VRT modes. The patient received 100 cc of contrast.   FINDINGS: Image quality: Excellent.   Noise artifact is: Limited.   Valve Morphology: Tricuspid aortic valve. Severely calcified leaflets. Bulky calcification of the LCC. Restricted movement in systole.   Aortic Valve Calcium score: 3101   Aortic annular dimension:   Phase assessed: 10%   Annular area: 563 mm2   Annular perimeter: 85.4 mm   Max diameter: 28.3 mm   Min diameter: 25.7 mm   Annular and subannular calcification: Mild annular calcium under the LCC.   Membranous septum length: 10.2 mm   Optimal coplanar projection: RAO 3 CRA 2   Coronary Artery Height above Annulus:   Left Main: 16.6 mm   Right Coronary: 18.1 mm   Sinus  of Valsalva Measurements:   Non-coronary: 32.5 mm   Right-coronary: 31 mm   Left-coronary: 35.7 mm   Sinus of Valsalva Height:   Non-coronary: 23.7 mm   Right-coronary: 23.9 mm   Left-coronary: 24.8 mm   Sinotubular Junction: 30 mm   Ascending Thoracic Aorta: 37 mm   Coronary Arteries: Normal coronary origin. Co-dominance. The study was performed without use of NTG and is insufficient for plaque evaluation. Please refer to recent cardiac catheterization for coronary assessment. LAD stent noted. 3-vessel coronary calcifications.   Cardiac Morphology:   Right Atrium: Right atrial size is within normal limits.   Right Ventricle: The right ventricular cavity is within normal limits.   Left Atrium: Left atrial size is normal in size with no left atrial appendage filling defect.   Left Ventricle: The ventricular cavity size is within normal limits.   Pulmonary arteries: Dilated pulmonary artery suggestive of pulmonary hypertension.   Pulmonary veins: Normal pulmonary venous drainage.   Pericardium: Normal thickness with no significant effusion or calcium present.   Mitral Valve: The mitral valve is normal structure without significant calcification.   Extra-cardiac findings: See attached radiology report for non-cardiac structures.   IMPRESSION: 1. Tricuspid aortic valve. Severe aortic stenosis. Annular measurements favor a 29 mm S3 (563 mm2).   2. Mild annular calcium under the LCC.   3. Sufficient coronary to annulus distance.   4. Optimal Fluoroscopic Angle for Delivery: RAO 3 CRA 2   5. Dilated pulmonary artery suggestive of pulmonary hypertension.   Gerri Spore T. Flora Lipps, MD     Electronically Signed   By: Lennie Odor M.D.   On: 10/21/2022 19:54          Narrative & Impression  CLINICAL DATA:  Preop evaluation for aortic valve replacement   EXAM: CT ANGIOGRAPHY CHEST, ABDOMEN AND PELVIS   TECHNIQUE: Non-contrast CT of the chest was  initially obtained.   Multidetector CT imaging through the chest, abdomen and pelvis was performed using the standard protocol during bolus administration of intravenous contrast. Multiplanar reconstructed images and MIPs were obtained and reviewed to evaluate the  vascular anatomy.   RADIATION DOSE REDUCTION: This exam was performed according to the departmental dose-optimization program which includes automated exposure control, adjustment of the mA and/or kV according to patient size and/or use of iterative reconstruction technique.   CONTRAST:  95mL OMNIPAQUE IOHEXOL 350 MG/ML SOLN   COMPARISON:  None Available.   FINDINGS: CTA CHEST FINDINGS   Cardiovascular: Cardiomegaly. No pericardial effusion. Aortic valve thickening and calcifications. Mitral annular calcifications. Normal caliber thoracic aorta with moderate atherosclerotic disease. Standard three-vessel aortic arch with no significant stenosis. Left main and 3 vessel coronary artery disease.   Mediastinum/Nodes: Esophagus and thyroid are unremarkable. No enlarged lymph nodes seen in the chest.   Lungs/Pleura: Central airways are patent. No consolidation, pleural effusion or pneumothorax.   Musculoskeletal: No chest wall abnormality. No acute or significant osseous findings.   CTA ABDOMEN AND PELVIS FINDINGS   Hepatobiliary: No focal liver abnormality is seen. No gallstones, gallbladder wall thickening, or biliary dilatation.   Pancreas: Unremarkable. No pancreatic ductal dilatation or surrounding inflammatory changes.   Spleen: Normal in size without focal abnormality.   Adrenals/Urinary Tract: Bilateral adrenal glands are unremarkable. No hydronephrosis. Bilateral nonobstructing renal stones. Bladder is decompressed.   Stomach/Bowel: Stomach is within normal limits. Appendix appears normal. Mild diverticulosis. No evidence of bowel wall thickening, distention, or inflammatory changes.    Vascular/lymphatic: Normal caliber thoracic aorta with severe calcified and noncalcified plaque. No pathologically enlarged lymph nodes seen in the chest.   Reproductive: Prostate is unremarkable.   Other: Soft tissue stranding of the subcutaneous soft tissues of the right lower anterior abdominal wall, likely related to subcutaneous injections. No intraperitoneal free fluid or free air. Small bilateral fat containing inguinal hernias.   Musculoskeletal: No acute or significant osseous findings.   VASCULAR MEASUREMENTS PERTINENT TO TAVR:   AORTA:   Minimal Aortic Diameter -  14.3 mm   Severity of Aortic Calcification-severe   RIGHT PELVIS:   Right Common Iliac Artery -   Minimal Diameter-14.3 mm   Tortuosity-mild   Calcification-severe   Right External Iliac Artery -   Minimal Diameter-9.5 mm   Tortuosity-moderate   Calcification-mild   Right Common Femoral Artery -   Minimal Diameter-8.4 mm   Tortuosity-mild   Calcification-severe   LEFT PELVIS:   Left Common Iliac Artery -   Minimal Diameter-10.6 mm   Tortuosity-moderate   Calcification-moderate   Left External Iliac Artery -   Minimal Diameter-9.7 mm   Tortuosity-moderate   Calcification-none   Left Common Femoral Artery -   Minimal Diameter-0.2 mm   Tortuosity-mild   Calcification-severe   Review of the MIP images confirms the above findings.   IMPRESSION: 1. Vascular findings and measurements pertinent to potential TAVR procedure, as detailed above. 2. Thickening and calcification of the aortic valve, compatible with reported clinical history of aortic stenosis. 3. 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 Left main and 3 vessel coronary artery disease. 4. Bilateral nonobstructing renal stones.     Electronically Signed   By: Allegra Lai M.D.   On: 10/21/2022 15:56        Impression:   This 77 year old gentleman has stage D, severe, symptomatic aortic  stenosis with NYHA class III symptoms of exertional fatigue and shortness of breath consistent with chronic diastolic congestive heart failure.  I have personally reviewed his 2D echocardiogram, cardiac catheterization, and CTA studies.  His echocardiogram in December 2023 showed a mean gradient across aortic valve of 51 mmHg with a peak of 77.1 mmHg.  Aortic valve  area by VTI was 0.59 cm.  There is mild aortic insufficiency.  The mitral valve is degenerative with moderate thickening of the leaflets and moderate mitral annular calcification.  The mean mitral valve gradient was 11 mmHg.  Left ventricular ejection fraction was 50 to 55% with grade 3 diastolic dysfunction.  Cardiac catheterization showed an 80% mid LAD stenosis that was treated successfully with atherectomy and stenting.  I agree that aortic valve replacement is indicated in this patient for relief of his symptoms and to prevent left ventricular deterioration.  His gated cardiac CTA shows anatomy suitable for TAVR using a 29 mm SAPIEN 3 valve.  Abdominal and pelvic CTA shows adequate pelvic vascular anatomy to allow transfemoral insertion.   The patient and his daughter were counseled at length regarding treatment alternatives for management of severe symptomatic aortic stenosis. The risks and benefits of surgical intervention has been discussed in detail. Long-term prognosis with medical therapy was discussed. Alternative approaches such as conventional surgical aortic valve replacement, transcatheter aortic valve replacement, and palliative medical therapy were compared and contrasted at length. This discussion was placed in the context of the patient's own specific clinical presentation and past medical history. All of their questions have been addressed.    Following the decision to proceed with transcatheter aortic valve replacement, a discussion was held regarding what types of management strategies would be attempted intraoperatively in the  event of life-threatening complications, including whether or not the patient would be considered a candidate for the use of cardiopulmonary bypass and/or conversion to open sternotomy for attempted surgical intervention.  I think he would be a candidate for emergent sternotomy to manage any intraoperative complications.  The patient is aware of the fact that transient use of cardiopulmonary bypass may be necessary. The patient has been advised of a variety of complications that might develop including but not limited to risks of death, stroke, paravalvular leak, aortic dissection or other major vascular complications, aortic annulus rupture, device embolization, cardiac rupture or perforation, mitral regurgitation, acute myocardial infarction, arrhythmia, heart block or bradycardia requiring permanent pacemaker placement, congestive heart failure, respiratory failure, renal failure, pneumonia, infection, other late complications related to structural valve deterioration or migration, or other complications that might ultimately cause a temporary or permanent loss of functional independence or other long term morbidity. The patient provides full informed consent for the procedure as described and all questions were answered.       Plan:   Transfemoral TAVR using a SAPIEN 3 valve.    Alleen Borne, MD

## 2022-11-26 ENCOUNTER — Inpatient Hospital Stay (HOSPITAL_COMMUNITY): Payer: Medicare Other | Admitting: Anesthesiology

## 2022-11-26 ENCOUNTER — Inpatient Hospital Stay (HOSPITAL_COMMUNITY): Payer: Medicare Other | Admitting: Emergency Medicine

## 2022-11-26 ENCOUNTER — Encounter (HOSPITAL_COMMUNITY): Payer: Self-pay | Admitting: Cardiovascular Disease

## 2022-11-26 ENCOUNTER — Encounter (HOSPITAL_COMMUNITY)
Admission: RE | Disposition: A | Discharge status: Home / Self Care | Payer: Self-pay | Source: Home / Self Care | Attending: Cardiovascular Disease

## 2022-11-26 ENCOUNTER — Inpatient Hospital Stay (HOSPITAL_COMMUNITY): Payer: Medicare Other

## 2022-11-26 ENCOUNTER — Inpatient Hospital Stay (HOSPITAL_COMMUNITY)
Admission: RE | Admit: 2022-11-26 | Discharge: 2022-11-27 | DRG: 267 | Disposition: A | Discharge status: Home / Self Care | Payer: Medicare Other | Attending: Cardiovascular Disease | Admitting: Cardiovascular Disease

## 2022-11-26 ENCOUNTER — Other Ambulatory Visit: Payer: Self-pay

## 2022-11-26 ENCOUNTER — Other Ambulatory Visit: Payer: Self-pay | Admitting: Cardiology

## 2022-11-26 DIAGNOSIS — Z955 Presence of coronary angioplasty implant and graft: Secondary | ICD-10-CM

## 2022-11-26 DIAGNOSIS — Z87442 Personal history of urinary calculi: Secondary | ICD-10-CM | POA: Diagnosis not present

## 2022-11-26 DIAGNOSIS — Z8673 Personal history of transient ischemic attack (TIA), and cerebral infarction without residual deficits: Secondary | ICD-10-CM | POA: Diagnosis not present

## 2022-11-26 DIAGNOSIS — N189 Chronic kidney disease, unspecified: Secondary | ICD-10-CM

## 2022-11-26 DIAGNOSIS — Z79899 Other long term (current) drug therapy: Secondary | ICD-10-CM | POA: Diagnosis not present

## 2022-11-26 DIAGNOSIS — Z8249 Family history of ischemic heart disease and other diseases of the circulatory system: Secondary | ICD-10-CM

## 2022-11-26 DIAGNOSIS — Z006 Encounter for examination for normal comparison and control in clinical research program: Secondary | ICD-10-CM | POA: Diagnosis not present

## 2022-11-26 DIAGNOSIS — I350 Nonrheumatic aortic (valve) stenosis: Principal | ICD-10-CM

## 2022-11-26 DIAGNOSIS — E1122 Type 2 diabetes mellitus with diabetic chronic kidney disease: Secondary | ICD-10-CM | POA: Diagnosis present

## 2022-11-26 DIAGNOSIS — I498 Other specified cardiac arrhythmias: Secondary | ICD-10-CM | POA: Diagnosis not present

## 2022-11-26 DIAGNOSIS — I130 Hypertensive heart and chronic kidney disease with heart failure and stage 1 through stage 4 chronic kidney disease, or unspecified chronic kidney disease: Secondary | ICD-10-CM | POA: Diagnosis present

## 2022-11-26 DIAGNOSIS — I129 Hypertensive chronic kidney disease with stage 1 through stage 4 chronic kidney disease, or unspecified chronic kidney disease: Secondary | ICD-10-CM | POA: Diagnosis not present

## 2022-11-26 DIAGNOSIS — D631 Anemia in chronic kidney disease: Secondary | ICD-10-CM

## 2022-11-26 DIAGNOSIS — I2510 Atherosclerotic heart disease of native coronary artery without angina pectoris: Secondary | ICD-10-CM | POA: Diagnosis present

## 2022-11-26 DIAGNOSIS — Z7902 Long term (current) use of antithrombotics/antiplatelets: Secondary | ICD-10-CM | POA: Diagnosis not present

## 2022-11-26 DIAGNOSIS — Z794 Long term (current) use of insulin: Secondary | ICD-10-CM | POA: Diagnosis not present

## 2022-11-26 DIAGNOSIS — Z952 Presence of prosthetic heart valve: Secondary | ICD-10-CM

## 2022-11-26 DIAGNOSIS — N183 Chronic kidney disease, stage 3 unspecified: Secondary | ICD-10-CM | POA: Diagnosis present

## 2022-11-26 DIAGNOSIS — J45909 Unspecified asthma, uncomplicated: Secondary | ICD-10-CM | POA: Diagnosis present

## 2022-11-26 DIAGNOSIS — I480 Paroxysmal atrial fibrillation: Secondary | ICD-10-CM | POA: Diagnosis present

## 2022-11-26 DIAGNOSIS — Z7951 Long term (current) use of inhaled steroids: Secondary | ICD-10-CM | POA: Diagnosis not present

## 2022-11-26 DIAGNOSIS — I5032 Chronic diastolic (congestive) heart failure: Secondary | ICD-10-CM | POA: Diagnosis present

## 2022-11-26 DIAGNOSIS — H3530 Unspecified macular degeneration: Secondary | ICD-10-CM | POA: Diagnosis present

## 2022-11-26 DIAGNOSIS — N1831 Chronic kidney disease, stage 3a: Secondary | ICD-10-CM | POA: Diagnosis present

## 2022-11-26 DIAGNOSIS — E785 Hyperlipidemia, unspecified: Secondary | ICD-10-CM | POA: Diagnosis present

## 2022-11-26 DIAGNOSIS — Z7982 Long term (current) use of aspirin: Secondary | ICD-10-CM

## 2022-11-26 DIAGNOSIS — I3481 Nonrheumatic mitral (valve) annulus calcification: Secondary | ICD-10-CM | POA: Diagnosis present

## 2022-11-26 DIAGNOSIS — I639 Cerebral infarction, unspecified: Secondary | ICD-10-CM | POA: Diagnosis present

## 2022-11-26 DIAGNOSIS — I10 Essential (primary) hypertension: Secondary | ICD-10-CM | POA: Diagnosis present

## 2022-11-26 DIAGNOSIS — Z7901 Long term (current) use of anticoagulants: Secondary | ICD-10-CM | POA: Diagnosis not present

## 2022-11-26 HISTORY — DX: Presence of prosthetic heart valve: Z95.2

## 2022-11-26 HISTORY — PX: TRANSCATHETER AORTIC VALVE REPLACEMENT, TRANSFEMORAL: SHX6400

## 2022-11-26 HISTORY — PX: INTRAOPERATIVE TRANSTHORACIC ECHOCARDIOGRAM: SHX6523

## 2022-11-26 LAB — POCT I-STAT, CHEM 8
BUN: 25 mg/dL — ABNORMAL HIGH (ref 8–23)
Calcium, Ion: 1.26 mmol/L (ref 1.15–1.40)
Chloride: 104 mmol/L (ref 98–111)
Creatinine, Ser: 1.4 mg/dL — ABNORMAL HIGH (ref 0.61–1.24)
Glucose, Bld: 167 mg/dL — ABNORMAL HIGH (ref 70–99)
HCT: 31 % — ABNORMAL LOW (ref 39.0–52.0)
Hemoglobin: 10.5 g/dL — ABNORMAL LOW (ref 13.0–17.0)
Potassium: 4 mmol/L (ref 3.5–5.1)
Sodium: 143 mmol/L (ref 135–145)
TCO2: 26 mmol/L (ref 22–32)

## 2022-11-26 LAB — ECHOCARDIOGRAM LIMITED
AR max vel: 3.36 cm2
AV Area VTI: 3.48 cm2
AV Area mean vel: 3.52 cm2
AV Mean grad: 4 mmHg
AV Peak grad: 7.3 mmHg
Ao pk vel: 1.35 m/s
Est EF: 55
S' Lateral: 3.3 cm

## 2022-11-26 LAB — GLUCOSE, CAPILLARY
Glucose-Capillary: 166 mg/dL — ABNORMAL HIGH (ref 70–99)
Glucose-Capillary: 161 mg/dL — ABNORMAL HIGH (ref 70–99)
Glucose-Capillary: 121 mg/dL — ABNORMAL HIGH (ref 70–99)
Glucose-Capillary: 148 mg/dL — ABNORMAL HIGH (ref 70–99)

## 2022-11-26 LAB — ABO/RH: ABO/RH(D): O POS

## 2022-11-26 SURGERY — IMPLANTATION, AORTIC VALVE, TRANSCATHETER, FEMORAL APPROACH
Anesthesia: Monitor Anesthesia Care

## 2022-11-26 MED ORDER — PROPOFOL 500 MG/50ML IV EMUL
INTRAVENOUS | Status: DC | PRN
  Administered 2022-11-26: 25 ug/kg/min via INTRAVENOUS

## 2022-11-26 MED ORDER — DEXMEDETOMIDINE HCL IN NACL 400 MCG/100ML IV SOLN
INTRAVENOUS | Status: AC
  Filled 2022-11-26: qty 100

## 2022-11-26 MED ORDER — FUROSEMIDE 40 MG PO TABS: 40.0000 mg | ORAL_TABLET | Freq: Every day | ORAL | Status: DC

## 2022-11-26 MED ORDER — HEPARIN 30,000 UNITS/1000 ML (OHS) CELLSAVER SOLUTION
Status: DC
  Filled 2022-11-26: qty 1000

## 2022-11-26 MED ORDER — ASPIRIN 81 MG PO TBEC: 81.0000 mg | DELAYED_RELEASE_TABLET | Freq: Every day | ORAL | Status: DC

## 2022-11-26 MED ORDER — INSULIN ASPART 100 UNIT/ML IJ SOLN: 0.0000 [IU] | INTRAMUSCULAR | Status: DC

## 2022-11-26 MED ORDER — SODIUM CHLORIDE 0.9% FLUSH: 3.0000 mL | INTRAVENOUS | Status: DC | PRN

## 2022-11-26 MED ORDER — CHLORHEXIDINE GLUCONATE 4 % EX LIQD: 60.0000 mL | Freq: Once | CUTANEOUS | Status: DC

## 2022-11-26 MED ORDER — HEPARIN SODIUM (PORCINE) 1000 UNIT/ML IJ SOLN
INTRAMUSCULAR | Status: AC
  Filled 2022-11-26: qty 30

## 2022-11-26 MED ORDER — PROPOFOL 1000 MG/100ML IV EMUL
INTRAVENOUS | Status: AC
  Filled 2022-11-26: qty 100

## 2022-11-26 MED ORDER — SODIUM CHLORIDE 0.9 % IV SOLN: 250.0000 mL | INTRAVENOUS | Status: DC | PRN

## 2022-11-26 MED ORDER — LACTATED RINGERS IV SOLN: INTRAVENOUS | Status: DC | PRN

## 2022-11-26 MED ORDER — INSULIN ASPART 100 UNIT/ML IJ SOLN
0.0000 [IU] | Freq: Three times a day (TID) | INTRAMUSCULAR | Status: DC
  Administered 2022-11-27: 2 [IU] via SUBCUTANEOUS

## 2022-11-26 MED ORDER — IODIXANOL 320 MG/ML IV SOLN
INTRAVENOUS | Status: DC | PRN
  Administered 2022-11-26: 80 mL via INTRA_ARTERIAL

## 2022-11-26 MED ORDER — PHENYLEPHRINE HCL-NACL 20-0.9 MG/250ML-% IV SOLN
INTRAVENOUS | Status: AC
  Filled 2022-11-26: qty 250

## 2022-11-26 MED ORDER — FENTANYL CITRATE (PF) 250 MCG/5ML IJ SOLN
INTRAMUSCULAR | Status: DC | PRN
  Administered 2022-11-26: 50 ug via INTRAVENOUS

## 2022-11-26 MED ORDER — CLEVIDIPINE BUTYRATE 0.5 MG/ML IV EMUL
INTRAVENOUS | Status: DC | PRN
  Administered 2022-11-26: 1 mg/h via INTRAVENOUS

## 2022-11-26 MED ORDER — ACETAMINOPHEN 650 MG RE SUPP: 650.0000 mg | Freq: Four times a day (QID) | RECTAL | Status: DC | PRN

## 2022-11-26 MED ORDER — TRAMADOL HCL 50 MG PO TABS: 50.0000 mg | ORAL_TABLET | ORAL | Status: DC | PRN

## 2022-11-26 MED ORDER — SODIUM CHLORIDE 0.9% FLUSH
3.0000 mL | Freq: Two times a day (BID) | INTRAVENOUS | Status: DC
  Administered 2022-11-26: 3 mL via INTRAVENOUS

## 2022-11-26 MED ORDER — OXYCODONE HCL 5 MG PO TABS: 5.0000 mg | ORAL_TABLET | ORAL | Status: DC | PRN

## 2022-11-26 MED ORDER — HEPARIN (PORCINE) IN NACL 1000-0.9 UT/500ML-% IV SOLN
INTRAVENOUS | Status: DC | PRN
  Administered 2022-11-26 (×3): 500 mL

## 2022-11-26 MED ORDER — CEFAZOLIN IN SODIUM CHLORIDE 3-0.9 GM/100ML-% IV SOLN
3.0000 g | INTRAVENOUS | Status: AC
  Administered 2022-11-26: 3 g via INTRAVENOUS
  Filled 2022-11-26: qty 100

## 2022-11-26 MED ORDER — DEXMEDETOMIDINE HCL IN NACL 400 MCG/100ML IV SOLN
INTRAVENOUS | Status: DC | PRN
  Administered 2022-11-26: 131.52 ug via INTRAVENOUS

## 2022-11-26 MED ORDER — ONDANSETRON HCL 4 MG/2ML IJ SOLN: 4.0000 mg | Freq: Four times a day (QID) | INTRAMUSCULAR | Status: DC | PRN

## 2022-11-26 MED ORDER — APIXABAN 5 MG PO TABS
5.0000 mg | ORAL_TABLET | Freq: Two times a day (BID) | ORAL | Status: DC
  Administered 2022-11-27: 5 mg via ORAL
  Filled 2022-11-26: qty 1

## 2022-11-26 MED ORDER — POTASSIUM CHLORIDE 2 MEQ/ML IV SOLN
80.0000 meq | INTRAVENOUS | Status: DC
  Filled 2022-11-26: qty 40

## 2022-11-26 MED ORDER — SODIUM CHLORIDE 0.9 % IV SOLN: 250.0000 mL | INTRAVENOUS | Status: DC

## 2022-11-26 MED ORDER — LIDOCAINE HCL (PF) 1 % IJ SOLN
INTRAMUSCULAR | Status: DC | PRN
  Administered 2022-11-26: 10 mL

## 2022-11-26 MED ORDER — NITROGLYCERIN IN D5W 200-5 MCG/ML-% IV SOLN
0.0000 ug/min | INTRAVENOUS | Status: DC
  Filled 2022-11-26: qty 250

## 2022-11-26 MED ORDER — FENTANYL CITRATE (PF) 100 MCG/2ML IJ SOLN
INTRAMUSCULAR | Status: AC
  Filled 2022-11-26: qty 2

## 2022-11-26 MED ORDER — SODIUM CHLORIDE 0.9 % IV SOLN: INTRAVENOUS | Status: AC

## 2022-11-26 MED ORDER — ACETAMINOPHEN 500 MG PO TABS
1000.0000 mg | ORAL_TABLET | Freq: Once | ORAL | Status: AC
  Administered 2022-11-26: 1000 mg via ORAL
  Filled 2022-11-26: qty 2

## 2022-11-26 MED ORDER — NOREPINEPHRINE 4 MG/250ML-% IV SOLN
0.0000 ug/min | INTRAVENOUS | Status: DC
  Filled 2022-11-26: qty 250

## 2022-11-26 MED ORDER — SODIUM CHLORIDE 0.9 % IV SOLN: INTRAVENOUS | Status: DC

## 2022-11-26 MED ORDER — HEPARIN SODIUM (PORCINE) 1000 UNIT/ML IJ SOLN
INTRAMUSCULAR | Status: DC | PRN
  Administered 2022-11-26: 19000 [IU] via INTRAVENOUS

## 2022-11-26 MED ORDER — MORPHINE SULFATE (PF) 2 MG/ML IV SOLN: 1.0000 mg | INTRAVENOUS | Status: DC | PRN

## 2022-11-26 MED ORDER — PROTAMINE SULFATE 10 MG/ML IV SOLN
INTRAVENOUS | Status: DC | PRN
  Administered 2022-11-26: 190 mg via INTRAVENOUS

## 2022-11-26 MED ORDER — CHLORHEXIDINE GLUCONATE 4 % EX LIQD: 30.0000 mL | CUTANEOUS | Status: DC

## 2022-11-26 MED ORDER — DEXMEDETOMIDINE HCL IN NACL 400 MCG/100ML IV SOLN
0.1000 ug/kg/h | INTRAVENOUS | Status: DC
  Filled 2022-11-26: qty 100

## 2022-11-26 MED ORDER — CEFAZOLIN SODIUM-DEXTROSE 2-4 GM/100ML-% IV SOLN
2.0000 g | Freq: Three times a day (TID) | INTRAVENOUS | Status: AC
  Administered 2022-11-26 (×2): 2 g via INTRAVENOUS
  Filled 2022-11-26 (×2): qty 100

## 2022-11-26 MED ORDER — ATORVASTATIN CALCIUM 80 MG PO TABS
80.0000 mg | ORAL_TABLET | Freq: Every evening | ORAL | Status: DC
  Administered 2022-11-26: 80 mg via ORAL
  Filled 2022-11-26: qty 1

## 2022-11-26 MED ORDER — MAGNESIUM SULFATE 50 % IJ SOLN
40.0000 meq | INTRAMUSCULAR | Status: DC
  Filled 2022-11-26: qty 9.85

## 2022-11-26 MED ORDER — ACETAMINOPHEN 325 MG PO TABS
650.0000 mg | ORAL_TABLET | Freq: Four times a day (QID) | ORAL | Status: DC | PRN
  Administered 2022-11-26 – 2022-11-27 (×2): 650 mg via ORAL
  Filled 2022-11-26 (×2): qty 2

## 2022-11-26 MED ORDER — CLOPIDOGREL BISULFATE 75 MG PO TABS
75.0000 mg | ORAL_TABLET | Freq: Every day | ORAL | Status: DC
  Administered 2022-11-27: 75 mg via ORAL
  Filled 2022-11-26: qty 1

## 2022-11-26 MED ORDER — LIDOCAINE HCL (PF) 1 % IJ SOLN
INTRAMUSCULAR | Status: AC
  Filled 2022-11-26: qty 30

## 2022-11-26 MED ORDER — CHLORHEXIDINE GLUCONATE 0.12 % MT SOLN
15.0000 mL | Freq: Once | OROMUCOSAL | Status: AC
  Administered 2022-11-26: 15 mL via OROMUCOSAL
  Filled 2022-11-26 (×2): qty 15

## 2022-11-26 SURGICAL SUPPLY — 32 items
BAG SNAP BAND KOVER 36X36 (MISCELLANEOUS) ×2 IMPLANT
BALLN TRUE 22X4.5 (BALLOONS) ×1
BALLOON TRUE 22X4.5 (BALLOONS) IMPLANT
CABLE ADAPT PACING TEMP 12FT (ADAPTER) IMPLANT
CATH COMMANDER DELIVERY SYS 29 (CATHETERS) IMPLANT
CATH INFINITI 5FR ANG PIGTAIL (CATHETERS) IMPLANT
CATH INFINITI 6F AL1 (CATHETERS) IMPLANT
CATH S G BIP PACING (CATHETERS) IMPLANT
CLOSURE PERCLOSE PROSTYLE (VASCULAR PRODUCTS) IMPLANT
CRIMPER (MISCELLANEOUS) IMPLANT
DEVICE INFLATION ATRION QL38 (MISCELLANEOUS) IMPLANT
KIT HEART LEFT (KITS) ×1 IMPLANT
KIT MICROPUNCTURE NIT STIFF (SHEATH) IMPLANT
KIT SAPIAN 3 ULTRA RESILIA 29 (Valve) IMPLANT
PACK CARDIAC CATHETERIZATION (CUSTOM PROCEDURE TRAY) ×1 IMPLANT
SHEATH BRITE TIP 7FR 35CM (SHEATH) IMPLANT
SHEATH INTRODUCER SET 29 (SHEATH) IMPLANT
SHEATH PINNACLE 6F 10CM (SHEATH) IMPLANT
SHEATH PINNACLE 8F 10CM (SHEATH) IMPLANT
SHEATH PROBE COVER 6X72 (BAG) IMPLANT
SLEEVE REPOSITIONING LENGTH 30 (MISCELLANEOUS) IMPLANT
STOPCOCK MORSE 400PSI 3WAY (MISCELLANEOUS) ×2 IMPLANT
SYR MEDRAD MARK 7 150ML (SYRINGE) IMPLANT
TRANSDUCER W/STOPCOCK (MISCELLANEOUS) ×2 IMPLANT
TUBING ART PRESS 72  MALE/FEM (TUBING) ×2
TUBING ART PRESS 72 MALE/FEM (TUBING) IMPLANT
TUBING CONTRAST HIGH PRESS 48 (TUBING) IMPLANT
WIRE AMPLATZ SS-J .035X180CM (WIRE) IMPLANT
WIRE EMERALD 3MM-J .035X150CM (WIRE) IMPLANT
WIRE EMERALD 3MM-J .035X260CM (WIRE) IMPLANT
WIRE EMERALD ST .035X260CM (WIRE) IMPLANT
WIRE SAFARI SM CURVE 275 (WIRE) IMPLANT

## 2022-11-26 NOTE — Transfer of Care (Signed)
Immediate Anesthesia Transfer of Care Note  Patient: Brandon Sanchez  Procedure(s) Performed: Transcatheter Aortic Valve Replacement, Transfemoral INTRAOPERATIVE TRANSTHORACIC ECHOCARDIOGRAM  Patient Location: Cath Lab  Anesthesia Type:MAC  Level of Consciousness: awake and drowsy  Airway & Oxygen Therapy: Patient Spontanous Breathing and Patient connected to nasal cannula oxygen  Post-op Assessment: Report given to RN, Post -op Vital signs reviewed and stable, and Patient moving all extremities X 4  Post vital signs: Reviewed and stable  Last Vitals:  Vitals Value Taken Time  BP 118/48   Temp    Pulse 64   Resp 16   SpO2 94     Last Pain:  Vitals:   11/26/22 0544  TempSrc: Oral         Complications: There were no known notable events for this encounter.

## 2022-11-26 NOTE — Discharge Summary (Incomplete)
[facility]   MULTIDISCIPLINARY HEART VALVE TEAM  Discharge Summary    Patient ID: Brandon Sanchez MRN: [id number]; DOB: 02-15-1946  Admit date: 11/26/2022 Discharge date: 11/26/2022  Primary Care Provider: Annie Sable, MD  Primary Cardiologist: Jodelle Red, MD / Dr. Clifton James, MD and Dr. Laneta Simmers, MD   Discharge Diagnoses    Principal Problem:   Aortic stenosis, severe Active Problems:   HLD (hyperlipidemia)   Essential hypertension   PAF (paroxysmal atrial fibrillation)   CVA (cerebral vascular accident)   Chronic heart failure with preserved ejection fraction (HFpEF)   CKD (chronic kidney disease) stage 3, GFR 30-59 ml/min  Allergies No Known Allergies  Diagnostic Studies/Procedures    TAVR OPERATIVE NOTE     Date of Procedure:                11/26/2022   Preoperative Diagnosis:      Severe Aortic Stenosis    Postoperative Diagnosis:    Same    Procedure:        Transcatheter Aortic Valve Replacement - Percutaneous Right Transfemoral Approach             Edwards Sapien 3 Ultra Resilia THV (size 29 mm, model # 9755RSL, serial # [id number])              Co-Surgeons:                        Alleen Borne, MD and Verne Carrow, MD     Anesthesiologist:                  Glade Stanford, MD   Echocardiographer:              Burna Forts, MD   Pre-operative Echo Findings: Severe aortic stenosis Normal left ventricular systolic function   Post-operative Echo Findings: No paravalvular leak Normal left ventricular systolic function ____________   Echo ***: completed but pending formal read at the time of discharge   History of Present Illness     Brandon Sanchez is a 77 y.o. male with a history of chronic diastolic CHF, asthma, DM, HTN, hyperlipidemia, paroxysmal atrial fibrillation, prior CVA, nephrolithiasis, CKD, CAD s/p PCI LAD 2/16, and severe aortic stenosis who presented to [facility] on 11/26/22 for planned TAVR.   Mr. Gotto is  followed by Dr. Cristal Deer for his cardiology care. He was referred to the structural heart team after being seen with worsening symptoms of SOB and fatigue. Echocardiogram showed severe aortic stenosis with G3 restrictive filling, mean gradient , peak 77.33mmHg, and AVA by VTI at 0.59cm2. R/LHC showed pRCA to mRCA lesion is 30% stenosed and mLAD lesion at 80% and is s/p orbital atherectomy with PTCA/DES x 1 mid LAD. He was seen in follow up s/p PCI with plans to pursue TAVR CT imaging that showed P: 85.4 mm, A: 563 mm2. Cor suff height. Ca2+ score 3101. MSL: 10.2 mm. Deployment angle: RAO 3 CRA 2. Bulky calcification of the LCC and mild annular calcium under the LCC.   The patient was then evaluated by the multidisciplinary valve team and felt to have severe, symptomatic aortic stenosis and to be a suitable candidate for TAVR, which was set up for 11/26/22.    Hospital Course     Severe AS: s/p successful TAVR with a 29 mm Edwards Sapien 3 THV via the TF approach on 11/26/22. Post operative echo pending. Groin sites are stable. ECG with NSR and  no high grade heart block. Continue ASA and plavix. Discontinue central line and transfer to the floor. Early mobilization and hopeful discharge home tomorrow.     Chronic diastolic CHF  DM   HTN   Hyperlipidemia   Paroxysmal atrial fibrillation  Prior CVA, nephrolithiasis   CKD   CAD s/p PCI LAD 2/16    Consultants: ***   *** _____________  Discharge Vitals Blood pressure 125/63, pulse 76, temperature 98.3 F (36.8 C), temperature source Oral, resp. rate 17, height 6\' 2"  (1.88 m), weight 131.5 kg, SpO2 95 %.  Filed Weights   11/26/22 0544  Weight: 131.5 kg    *** physical exam  Labs & Radiologic Studies    CBC No results for input(s): "WBC", "NEUTROABS", "HGB", "HCT", "MCV", "PLT" in the last 72 hours. Basic Metabolic Panel No results for input(s): "NA", "K", "CL", "CO2", "GLUCOSE", "BUN", "CREATININE", "CALCIUM", "MG",  "PHOS" in the last 72 hours. Liver Function Tests No results for input(s): "AST", "ALT", "ALKPHOS", "BILITOT", "PROT", "ALBUMIN" in the last 72 hours. No results for input(s): "LIPASE", "AMYLASE" in the last 72 hours. Cardiac Enzymes No results for input(s): "CKTOTAL", "CKMB", "CKMBINDEX", "TROPONINI" in the last 72 hours. BNP Invalid input(s): "POCBNP" D-Dimer No results for input(s): "DDIMER" in the last 72 hours. Hemoglobin A1C No results for input(s): "HGBA1C" in the last 72 hours. Fasting Lipid Panel No results for input(s): "CHOL", "HDL", "LDLCALC", "TRIG", "CHOLHDL", "LDLDIRECT" in the last 72 hours. Thyroid Function Tests No results for input(s): "TSH", "T4TOTAL", "T3FREE", "THYROIDAB" in the last 72 hours.  Invalid input(s): "FREET3" _____________  DG Chest 2 View  Result Date: 11/24/2022 CLINICAL DATA:  Preop exam EXAM: CHEST - 2 VIEW COMPARISON:  Chest x-ray dated July 29, 2022 FINDINGS: The heart size and mediastinal contours are within normal limits. Both lungs are clear. The visualized skeletal structures are unremarkable. IMPRESSION: No active cardiopulmonary disease. Electronically Signed   By: Allegra Lai M.D.   On: 11/24/2022 22:20   Disposition   Pt is being discharged home today in good condition.  Follow-up Plans & Appointments       Discharge Medications   Allergies as of 11/26/2022   No Known Allergies   Med Rec must be completed prior to using this SMART[facility]***           Outstanding Labs/Studies   ***  Duration of Discharge Encounter   Greater than 30 minutes including physician time.  SignedGeorgie Chard, NP 11/26/2022, 7:33 AM [phone]

## 2022-11-26 NOTE — Op Note (Signed)
[facility]   MULTIDISCIPLINARY HEART VALVE TEAM   TAVR OPERATIVE NOTE   Date of Procedure:  11/26/2022  Preoperative Diagnosis: Severe Aortic Stenosis   Postoperative Diagnosis: Same   Procedure:   Transcatheter Aortic Valve Replacement - Percutaneous Right Transfemoral Approach  Edwards Sapien 3 Ultra Resilia THV (size 29 mm, model # [id number], serial # [id number])   Co-Surgeons:  Alleen Borne, MD and Verne Carrow, MD   Anesthesiologist:  R. Sampson Goon, MD  Echocardiographer:  Demetrius Charity. Eden Emms, MD  Pre-operative Echo Findings: Severe aortic stenosis Normal left ventricular systolic function  Post-operative Echo Findings: No paravalvular leak Normal left ventricular systolic function   BRIEF CLINICAL NOTE AND INDICATIONS FOR SURGERY  This 77 year old gentleman has stage D, severe, symptomatic aortic stenosis with NYHA class III symptoms of exertional fatigue and shortness of breath consistent with chronic diastolic congestive heart failure.  I have personally reviewed his 2D echocardiogram, cardiac catheterization, and CTA studies.  His echocardiogram in December 2023 showed a mean gradient across aortic valve of 51 mmHg with a peak of 77.1 mmHg.  Aortic valve area by VTI was 0.59 cm.  There is mild aortic insufficiency.  The mitral valve is degenerative with moderate thickening of the leaflets and moderate mitral annular calcification.  The mean mitral valve gradient was 11 mmHg.  Left ventricular ejection fraction was 50 to 55% with grade 3 diastolic dysfunction.  Cardiac catheterization showed an 80% mid LAD stenosis that was treated successfully with atherectomy and stenting.  I agree that aortic valve replacement is indicated in this patient for relief of his symptoms and to prevent left ventricular deterioration.  His gated cardiac CTA shows anatomy suitable for TAVR using a 29 mm SAPIEN 3 valve.  Abdominal and pelvic CTA shows adequate pelvic vascular  anatomy to allow transfemoral insertion.   The patient and his daughter were counseled at length regarding treatment alternatives for management of severe symptomatic aortic stenosis. The risks and benefits of surgical intervention has been discussed in detail. Long-term prognosis with medical therapy was discussed. Alternative approaches such as conventional surgical aortic valve replacement, transcatheter aortic valve replacement, and palliative medical therapy were compared and contrasted at length. This discussion was placed in the context of the patient's own specific clinical presentation and past medical history. All of their questions have been addressed.    Following the decision to proceed with transcatheter aortic valve replacement, a discussion was held regarding what types of management strategies would be attempted intraoperatively in the event of life-threatening complications, including whether or not the patient would be considered a candidate for the use of cardiopulmonary bypass and/or conversion to open sternotomy for attempted surgical intervention.  I think he would be a candidate for emergent sternotomy to manage any intraoperative complications.  The patient is aware of the fact that transient use of cardiopulmonary bypass may be necessary. The patient has been advised of a variety of complications that might develop including but not limited to risks of death, stroke, paravalvular leak, aortic dissection or other major vascular complications, aortic annulus rupture, device embolization, cardiac rupture or perforation, mitral regurgitation, acute myocardial infarction, arrhythmia, heart block or bradycardia requiring permanent pacemaker placement, congestive heart failure, respiratory failure, renal failure, pneumonia, infection, other late complications related to structural valve deterioration or migration, or other complications that might ultimately cause a temporary or permanent loss  of functional independence or other long term morbidity. The patient provides full informed consent for the procedure as described and all  questions were answered.     DETAILS OF THE OPERATIVE PROCEDURE  PREPARATION:    The patient was brought to the operating room on the above mentioned date and appropriate monitoring was established by the anesthesia team. The patient was placed in the supine position on the operating table.  Intravenous antibiotics were administered. The patient was monitored closely throughout the procedure under conscious sedation.    Baseline transthoracic echocardiogram was performed. The patient's abdomen and both groins were prepped and draped in a sterile manner. A time out procedure was performed.   PERIPHERAL ACCESS:    Using the modified Seldinger technique, femoral arterial and venous access was obtained with placement of 6 Fr sheaths on the left side.  A pigtail diagnostic catheter was passed through the left arterial sheath under fluoroscopic guidance into the aortic root.  A temporary transvenous pacemaker catheter was passed through the left femoral venous sheath under fluoroscopic guidance into the right ventricle.  The pacemaker was tested to ensure stable lead placement and pacemaker capture. Aortic root angiography was performed in order to determine the optimal angiographic angle for valve deployment.   TRANSFEMORAL ACCESS:   Percutaneous transfemoral access and sheath placement was performed using ultrasound guidance.  The right common femoral artery was cannulated using a micropuncture needle and appropriate location was verified using hand injection angiogram.  A pair of Abbott Perclose percutaneous closure devices were placed and a 6 French sheath replaced into the femoral artery.  The patient was heparinized systemically and ACT verified > 250 seconds.    A 16 Fr transfemoral E-sheath was introduced into the right common femoral artery after  progressively dilating over an Amplatz superstiff wire. An AL-2 catheter was used to direct a straight-tip exchange length wire across the native aortic valve into the left ventricle. This was exchanged out for a pigtail catheter and position was confirmed in the LV apex. Simultaneous LV and Ao pressures were recorded.  The pigtail catheter was exchanged for a Safari wire in the LV apex.   BALLOON AORTIC VALVULOPLASTY:   Balloon aortic valvuloplasty was performed using a 22 mm Bard True valvuloplasty balloon.  Once optimal position was achieved, BAV was done under rapid ventricular pacing. The patient recovered well hemodynamically.    TRANSCATHETER HEART VALVE DEPLOYMENT:   An Edwards Sapien 3 Ultra transcatheter heart valve (size 29 mm) was prepared and crimped per manufacturer's guidelines, and the proper orientation of the valve is confirmed on the [employer] Commander delivery system. The valve was advanced through the introducer sheath using normal technique until in an appropriate position in the abdominal aorta beyond the sheath tip. The balloon was then retracted and using the fine-tuning wheel was centered on the valve. The valve was then advanced across the aortic arch using appropriate flexion of the catheter. The valve was carefully positioned across the aortic valve annulus. The Commander catheter was retracted using normal technique. Once final position of the valve has been confirmed by angiographic assessment, the valve is deployed during rapid ventricular pacing to maintain systolic blood pressure < 50 mmHg and pulse pressure < 10 mmHg. The balloon inflation is held for >3 seconds after reaching full deployment volume. Once the balloon has fully deflated the balloon is retracted into the ascending aorta and valve function is assessed using echocardiography. There is felt to be no paravalvular leak and no central aortic insufficiency. Mean gradient was 4 mm Hg.  The patient's hemodynamic  recovery following valve deployment is good.  The deployment  balloon and guidewire are both removed.    PROCEDURE COMPLETION:   The sheath was removed and femoral artery closure performed.  Protamine was administered once femoral arterial repair was complete. The temporary pacemaker and pigtail catheter were removed. The left femoral arterial and venous sheaths were left in place since the arterial sheath was being used to monitor BP.   The patient tolerated the procedure well and is transported to the cath lab recovery area in stable condition. There were no immediate intraoperative complications. All sponge instrument and needle counts are verified correct at completion of the operation.   No blood products were administered during the operation.  The patient received a total of 80 mL of intravenous contrast during the procedure.   Alleen Borne, MD 11/26/2022 9:46 AM

## 2022-11-26 NOTE — Progress Notes (Signed)
   Paged by RN. Patient has had 2 brief pauses this afternoon lasting between 2-3 seconds. He is completely asymptomatic. He is not on any AV nodals agents. Can continue to monitor for now. Asked RN to let us know if he has any longer pauses or is symptomatic with them.  Corrin Parker, PA-C 11/26/2022 5:41 PM

## 2022-11-26 NOTE — Anesthesia Procedure Notes (Signed)
Procedure Name: MAC Date/Time: 11/26/2022 7:49 AM  Performed by: Cheree Ditto, CRNAPre-anesthesia Checklist: Emergency Drugs available, Suction available and Patient being monitored Patient Re-evaluated:Patient Re-evaluated prior to induction Oxygen Delivery Method: Nasal cannula Induction Type: IV induction Comments: OPTIFLOW [place] @40L 

## 2022-11-26 NOTE — Interval H&P Note (Signed)
History and Physical Interval Note:  11/26/2022 6:21 AM  Brandon Sanchez  has presented today for surgery, with the diagnosis of Severe Aortic Stenosis.  The various methods of treatment have been discussed with the patient and family. After consideration of risks, benefits and other options for treatment, the patient has consented to  Procedure(s): Transcatheter Aortic Valve Replacement, Transfemoral (N/A) INTRAOPERATIVE TRANSTHORACIC ECHOCARDIOGRAM (N/A) as a surgical intervention.  The patient's history has been reviewed, patient examined, no change in status, stable for surgery.  I have reviewed the patient's chart and labs.  Questions were answered to the patient's satisfaction.     Alleen Borne

## 2022-11-26 NOTE — Anesthesia Postprocedure Evaluation (Signed)
Anesthesia Post Note  Patient: Brandon Sanchez  Procedure(s) Performed: Transcatheter Aortic Valve Replacement, Transfemoral INTRAOPERATIVE TRANSTHORACIC ECHOCARDIOGRAM     Patient location during evaluation: PACU Anesthesia Type: MAC Level of consciousness: awake and alert Pain management: pain level controlled Vital Signs Assessment: post-procedure vital signs reviewed and stable Respiratory status: spontaneous breathing, nonlabored ventilation, respiratory function stable and patient connected to nasal cannula oxygen Cardiovascular status: stable and blood pressure returned to baseline Postop Assessment: no apparent nausea or vomiting Anesthetic complications: no  There were no known notable events for this encounter.  Last Vitals:  Vitals:   11/26/22 1420 11/26/22 1531  BP: (!) 99/59 (!) 111/55  Pulse: (!) 55 64  Resp: 18 12  Temp: (!) 36.3 C 36.7 C  SpO2: 95% 98%    Last Pain:  Vitals:   11/26/22 1531  TempSrc: Oral  PainSc:                  Kennieth Rad

## 2022-11-26 NOTE — Progress Notes (Signed)
Site area: left groin  6 arterial and 7 venous sheats. Site Prior to Removal:  Level 0  Pressure Applied For 25 MINUTES    Minutes Beginning at 1020  Manual:   Yes.    Patient Status During Pull:  Stable  Post Pull Groin Site:  Level 0  Post Pull Instructions Given:  Yes.    Post Pull Pulses Present:  Yes.    Dressing Applied:  Yes.    Comments: Bed rest started at 1040 X 4 hr.

## 2022-11-26 NOTE — CV Procedure (Signed)
[facility]  TAVR OPERATIVE NOTE   Date of Procedure:  11/26/2022  Preoperative Diagnosis: Severe Aortic Stenosis   Postoperative Diagnosis: Same   Procedure:   Transcatheter Aortic Valve Replacement - Transfemoral Approach  Edwards Sapien 3 THV (size 29 mm, model # [id number], serial # [id number] )   Co-Surgeons:  Verne Carrow, MD and Evelene Croon , MD   Anesthesiologist:  Sampson Goon  Echocardiographer:  Eden Emms  Pre-operative Echo Findings: Severe aortic stenosis Normal left ventricular systolic function  Post-operative Echo Findings: No paravalvular leak Normal left ventricular systolic function  BRIEF CLINICAL NOTE AND INDICATIONS FOR SURGERY  77 yo male with history of chronic diastolic CHF, asthma, DM, HTN, hyperlipidemia, paroxysmal atrial fibrillation, prior CVA, nephrolithiasis, severe aortic stenosis and CAD. I saw him as a new consult in  December 2023 to discuss his aortic stenosis and possible TAVR. He has atrial fibrillation and has undergone atrial fibrillation ablation in July 2021. He has been on Eliquis. Echo 07/31/22 with LVEF=50-55%. Mild LVH. Severe aortic stenosis with mean gradient 51 mmHg, peak gradient 77 mmHg, AVA 0.48 cm2, DI 0.19, SVI 19.  Cardiac cath 08/29/22 with severe mid LAD stenosis. PCI was not performed given his chronic kidney disease.  I saw him on 09/03/22 to discuss staged PCI but he was having blood in his urine. He has since followed up with Urology and this was felt to be due to his kidney stones. I saw him in the office 09/25/22 and his urine had cleared. He underwent PCI with coronary atherectomy and stenting of the LAD on 10/04/22. Excellent result in the LAD with stenting. Creatinine stable post PCI. TAVR planned for today.   During the course of the patient's preoperative work up they have been evaluated comprehensively by a multidisciplinary team of specialists coordinated through the Multidisciplinary [facility]ve [facility]  in the [facility].  They have been demonstrated to suffer from symptomatic severe aortic stenosis as noted above. The patient has been counseled extensively as to the relative risks and benefits of all options for the treatment of severe aortic stenosis including long term medical therapy, conventional surgery for aortic valve replacement, and transcatheter aortic valve replacement.  The patient has been independently evaluated by Dr. Laneta Simmers with CT surgery and they are felt to be at high risk for conventional surgical aortic valve replacement. The surgeon indicated the patient would be a poor candidate for conventional surgery. Based upon review of all of the patient's preoperative diagnostic tests they are felt to be candidate for transcatheter aortic valve replacement using the transfemoral approach as an alternative to high risk conventional surgery.    Following the decision to proceed with transcatheter aortic valve replacement, a discussion has been held regarding what types of management strategies would be attempted intraoperatively in the event of life-threatening complications, including whether or not the patient would be considered a candidate for the use of cardiopulmonary bypass and/or conversion to open sternotomy for attempted surgical intervention.  The patient has been advised of a variety of complications that might develop peculiar to this approach including but not limited to risks of death, stroke, paravalvular leak, aortic dissection or other major vascular complications, aortic annulus rupture, device embolization, cardiac rupture or perforation, acute myocardial infarction, arrhythmia, heart block or bradycardia requiring permanent pacemaker placement, congestive heart failure, respiratory failure, renal failure, pneumonia, infection, other late complications related to structural valve deterioration or migration, or other complications that might ultimately  cause a temporary  or permanent loss of functional independence or other long term morbidity.  The patient provides full informed consent for the procedure as described and all questions were answered preoperatively.    DETAILS OF THE OPERATIVE PROCEDURE  PREPARATION:   The patient is brought to the operating room on the above mentioned date and central monitoring was established by the anesthesia team including placement of a radial arterial line. The patient is placed in the supine position on the operating table.  Intravenous antibiotics are administered. Conscious sedation is used.   Baseline transthoracic echocardiogram was performed. The patient's chest, abdomen, both groins, and both lower extremities are prepared and draped in a sterile manner. A time out procedure is performed.   PERIPHERAL ACCESS:   Using the modified Seldinger technique, femoral arterial and venous access were obtained with placement of a 6 Fr sheath in the artery and a 7 Fr sheath in the vein on the left side using u/s guidance.  A pigtail diagnostic catheter was passed through the femoral arterial sheath under fluoroscopic guidance into the aortic root.  A temporary transvenous pacemaker catheter was passed through the femoral venous sheath under fluoroscopic guidance into the right ventricle.  The pacemaker was tested to ensure stable lead placement and pacemaker capture. Aortic root angiography was performed in order to determine the optimal angiographic angle for valve deployment.  TRANSFEMORAL ACCESS:  A micropuncture kit was used to gain access to the right femoral artery using u/s guidance. Position confirmed with angiography. Pre-closure with double ProGlide closure devices. The patient was heparinized systemically and ACT verified > 250 seconds.    A 16 Fr transfemoral E-sheath was introduced into the right femoral artery after progressively dilating over an Amplatz superstiff wire. An AL-2 catheter was used to  direct a straight-tip exchange length wire across the native aortic valve into the left ventricle. This was exchanged out for a pigtail catheter and position was confirmed in the LV apex. Simultaneous LV and Ao pressures were recorded.  The pigtail catheter was then exchanged for a Safari wire in the LV apex.   TRANSCATHETER HEART VALVE DEPLOYMENT:  An Edwards Sapien 3 THV (size 29 mm) was prepared and crimped per manufacturer's guidelines, and the proper orientation of the valve is confirmed on the [employer] Commander delivery system. The valve was advanced through the introducer sheath using normal technique until in an appropriate position in the abdominal aorta beyond the sheath tip. The balloon was then retracted and using the fine-tuning wheel was centered on the valve. The valve was then advanced across the aortic arch using appropriate flexion of the catheter. The valve was carefully positioned across the aortic valve annulus. The Commander catheter was retracted using normal technique. Once final position of the valve has been confirmed by angiographic assessment, the valve is deployed while temporarily holding ventilation and during rapid ventricular pacing to maintain systolic blood pressure < 50 mmHg and pulse pressure < 10 mmHg. The balloon inflation is held for >3 seconds after reaching full deployment volume. Once the balloon has fully deflated the balloon is retracted into the ascending aorta and valve function is assessed using TTE. There is felt to be no paravalvular leak and no central aortic insufficiency.  The patient's hemodynamic recovery following valve deployment is good.  The deployment balloon and guidewire are both removed. Echo demostrated acceptable post-procedural gradients, stable mitral valve function, and no AI.   PROCEDURE COMPLETION:  The sheath was removed from the right femoral artery. Protamine was administered  once femoral arterial repair was complete.The left femoral  venous and arterial sheaths were left in place. Arterial pressure being transduced off of the left femoral sheath.  The temporary pacing wire was removed.     The patient tolerated the procedure well and is transported to the surgical intensive care in stable condition. There were no immediate intraoperative complications. All sponge instrument and needle counts are verified correct at completion of the operation.   No blood products were administered during the operation.  The patient received a total of 80 mL of intravenous contrast during the procedure.  LVEDP: 18 mmHg  Verne Carrow MD, [facility] 11/26/2022 9:39 AM

## 2022-11-26 NOTE — Progress Notes (Signed)
  Echocardiogram 2D Echocardiogram has been performed.  Brandon Sanchez 11/26/2022, 9:15 AM

## 2022-11-26 NOTE — Progress Notes (Signed)
?  [facility]   ?MULTIDISCIPLINARY HEART VALVE TEAM ? ?Patient doing well s/p TAVR. He is hemodynamically stable. Groin sites stable. ECG with no high grade block. Arterial line discontinued and transferred to 4E.  Plan for early ambulation after bedrest completed and hopeful discharge over the next 24-48 hours.  ? ?Melissa Pulido NP-C ?Structural Heart Team  ?Pager: [phone] ?Phone: [phone] ? ?

## 2022-11-26 NOTE — Anesthesia Preprocedure Evaluation (Signed)
Anesthesia Evaluation  Patient identified by MRN, date of birth, ID band Patient awake    Reviewed: Allergy & Precautions, NPO status , Patient's Chart, lab work & pertinent test results  Airway Mallampati: II  TM Distance: >3 FB Neck ROM: Full    Dental   Pulmonary shortness of breath, asthma    breath sounds clear to auscultation       Cardiovascular hypertension, Pt. on medications and Pt. on home beta blockers + CAD and + Cardiac Stents  + Valvular Problems/Murmurs AS  Rhythm:Regular Rate:Normal + Systolic murmurs    Neuro/Psych CVA    GI/Hepatic negative GI ROS, Neg liver ROS,,,  Endo/Other  diabetes, Type 2    Renal/GU CRFRenal disease     Musculoskeletal   Abdominal   Peds  Hematology  (+) Blood dyscrasia, anemia   Anesthesia Other Findings   Reproductive/Obstetrics                             Anesthesia Physical Anesthesia Plan  ASA: 4  Anesthesia Plan: MAC   Post-op Pain Management: Tylenol PO (pre-op)*, Minimal or no pain anticipated and Precedex   Induction:   PONV Risk Score and Plan: 1 and Propofol infusion and Ondansetron  Airway Management Planned: Natural Airway and Simple Face Mask  Additional Equipment: Arterial line  Intra-op Plan:   Post-operative Plan:   Informed Consent: I have reviewed the patients History and Physical, chart, labs and discussed the procedure including the risks, benefits and alternatives for the proposed anesthesia with the patient or authorized representative who has indicated his/her understanding and acceptance.       Plan Discussed with: CRNA  Anesthesia Plan Comments:        Anesthesia Quick Evaluation

## 2022-11-27 ENCOUNTER — Inpatient Hospital Stay (HOSPITAL_BASED_OUTPATIENT_CLINIC_OR_DEPARTMENT_OTHER)
Admit: 2022-11-27 | Discharge: 2022-11-27 | Disposition: A | Discharge status: Home / Self Care | Payer: Medicare Other | Attending: Cardiology | Admitting: Cardiology

## 2022-11-27 ENCOUNTER — Inpatient Hospital Stay (HOSPITAL_COMMUNITY): Payer: Medicare Other

## 2022-11-27 DIAGNOSIS — I498 Other specified cardiac arrhythmias: Secondary | ICD-10-CM | POA: Diagnosis not present

## 2022-11-27 DIAGNOSIS — Z952 Presence of prosthetic heart valve: Secondary | ICD-10-CM

## 2022-11-27 LAB — BASIC METABOLIC PANEL
Anion gap: 10 (ref 5–15)
BUN: 21 mg/dL (ref 8–23)
CO2: 26 mmol/L (ref 22–32)
Calcium: 9.1 mg/dL (ref 8.9–10.3)
Chloride: 105 mmol/L (ref 98–111)
Creatinine, Ser: 1.33 mg/dL — ABNORMAL HIGH (ref 0.61–1.24)
GFR, Estimated: 56 mL/min — ABNORMAL LOW (ref >60)
Glucose, Bld: 170 mg/dL — ABNORMAL HIGH (ref 70–99)
Potassium: 4.2 mmol/L (ref 3.5–5.1)
Sodium: 141 mmol/L (ref 135–145)

## 2022-11-27 LAB — CBC
HCT: 34.5 % — ABNORMAL LOW (ref 39.0–52.0)
Hemoglobin: 11.1 g/dL — ABNORMAL LOW (ref 13.0–17.0)
MCH: 29.8 pg (ref 26.0–34.0)
MCHC: 32.2 g/dL (ref 30.0–36.0)
MCV: 92.5 fL (ref 80.0–100.0)
Platelets: 153 10*3/uL (ref 150–400)
RBC: 3.73 MIL/uL — ABNORMAL LOW (ref 4.22–5.81)
RDW: 14.1 % (ref 11.5–15.5)
WBC: 9 10*3/uL (ref 4.0–10.5)
nRBC: 0 % (ref 0.0–0.2)

## 2022-11-27 LAB — GLUCOSE, CAPILLARY
Glucose-Capillary: 142 mg/dL — ABNORMAL HIGH (ref 70–99)
Glucose-Capillary: 144 mg/dL — ABNORMAL HIGH (ref 70–99)

## 2022-11-27 LAB — ECHOCARDIOGRAM COMPLETE
AR max vel: 2.08 cm2
AV Area VTI: 2.16 cm2
AV Area mean vel: 1.89 cm2
AV Mean grad: 8.7 mmHg
AV Peak grad: 14 mmHg
Ao pk vel: 1.87 m/s
Area-P 1/2: 2.46 cm2
Height: 74 in
MV VTI: 1.39 cm2
S' Lateral: 2.85 cm
Weight: 4707.2 oz

## 2022-11-27 LAB — MAGNESIUM: Magnesium: 1.8 mg/dL (ref 1.7–2.4)

## 2022-11-27 MED ORDER — PERFLUTREN LIPID MICROSPHERE
1.0000 mL | INTRAVENOUS | Status: DC | PRN
  Administered 2022-11-27: 2 mL via INTRAVENOUS

## 2022-11-27 MED FILL — Fentanyl Citrate Preservative Free (PF) Inj 100 MCG/2ML: INTRAMUSCULAR | Qty: 1 | Status: AC

## 2022-11-27 NOTE — Progress Notes (Signed)
CARDIAC REHAB PHASE I   PRE:  Rate/Rhythm: 66 SR  BP:  Sitting: 108/55      SaO2: 98 RA   MODE:  Ambulation: 150 ft   POST:  Rate/Rhythm: 85 SR  BP:  Sitting: 136/65      SaO2: 97 RA   Pt resting in bed feeling well today. Ambulated in hall, using front wheel walker, moving at slow steady gait. Pt tolerated well no pain, dizziness and mild SOB. Pt reports SOB has improved compared to pre-op and his bad knees slow him down. Back to bed with call bell and bedside table in reach. Post TAVR education including site care, restrictions, risk factors, heart healthy diabetic diet, exercise guidelines and CRP2 reviewed. All questions and concerns addressed. Pt is not interested in CRP2 at this time. He states he will let MD know if he changes him mind and wants to join CRP2. Plan for home later today.   [id number]  Woodroe Chen, RN BSN 11/27/2022 9:25 AM

## 2022-11-27 NOTE — Plan of Care (Signed)
Patient is discharging to home, with his daughter.

## 2022-11-27 NOTE — Progress Notes (Signed)
  Echocardiogram 2D Echocardiogram has been performed.  Milda Smart 11/27/2022, 12:06 PM

## 2022-11-27 NOTE — Progress Notes (Signed)
Pt is stable hemodynamically, afebrile, HR 70s, NSR on the monitor. We detect the different of BP right arm 93/40-99/59 mmHg, and BP left arm 111/55-132/61 mmHg. Both groins are soft, gauze dressings are clean and dry, negative for bleeding or hematoma. Pt denies pain on both groins. Pt ambulates to bathroom with one staff standby assist. He tolerates ambulation well, but only has  limit movement due to chronic both knees pain. He is able to rest well, no  acute distress noted overnight. We will monitor.  Filiberto Pinks, RN

## 2022-11-28 ENCOUNTER — Telehealth: Payer: Self-pay | Admitting: Cardiology

## 2022-11-28 DIAGNOSIS — I498 Other specified cardiac arrhythmias: Secondary | ICD-10-CM | POA: Diagnosis not present

## 2022-11-28 DIAGNOSIS — Z952 Presence of prosthetic heart valve: Secondary | ICD-10-CM | POA: Diagnosis not present

## 2022-11-28 NOTE — Telephone Encounter (Signed)
  [facility]ENTER   MULTIDISCIPLINARY HEART VALVE TEAM   Patient contacted regarding discharge from [facility] on 11/27/22   Patient understands to follow up with provider Georgie Chard on 4/15 at 8:15am at the [address] [facility] office Patient understands discharge instructions? Yes  Patient understands medications and regimen? Yes  Patient understands to bring all medications to this visit? Yes   Georgie Chard NP-C Structural Heart Team  Pager: [phone]

## 2022-11-28 NOTE — Progress Notes (Signed)
HE[facility] AND VASCULAR CENTER   MULTIDISCIPLINARY HEART VALVE CLINIC                                     Cardiology Office Note:    Date:  12/03/2022   ID:  Brandon Sanchez, DOB 01/18/1946, MRN [id number]  PCP:  Annie Sable, MD  [facility] Cardiologist:  Jodelle Red, MD / Dr. Clifton James, MD & Dr. Laneta Simmers, MD (TAVR) [facility] Electrophysiologist:  Will Jorja Loa, MD   Referring MD: Annie Sable, MD   Chief Complaint  Patient presents with   Follow-up    TOC s/p TAVR   History of Present Illness:    Brandon Sanchez is a 77 y.o. male with a hx of chronic diastolic CHF, asthma, DM, HTN, hyperlipidemia, paroxysmal atrial fibrillation, prior CVA, nephrolithiasis, CKD, CAD s/p PCI LAD 2/16, and severe aortic stenosis who is now s/p TAVR 11/26/22 and is being seen today for TOC follow up.    Brandon Sanchez is followed by Dr. Cristal Deer for his cardiology care. He was referred to the structural heart team after being seen with worsening symptoms of SOB and fatigue. Echocardiogram showed severe aortic stenosis with G3 restrictive filling, mean gradient , peak 77.28mmHg, and AVA by VTI at 0.59cm2. R/LHC showed pRCA to mRCA lesion is 30% stenosed and mLAD lesion at 80% and is s/p orbital atherectomy with PTCA/DES x 1 mid LAD. He was seen in follow up s/p PCI with plans to pursue TAVR CT imaging that showed P: 85.4 mm, A: 563 mm2. Cor suff height. Ca2+ score 3101. MSL: 10.2 mm. Deployment angle: RAO 3 CRA 2. Bulky calcification of the LCC and mild annular calcium under the LCC.    The patient was then evaluated by the multidisciplinary valve team and felt to have severe, symptomatic aortic stenosis and to be a suitable candidate and is now s/p successful TAVR with a 29 mm Edwards Sapien 3 THV via the TF approach on 11/26/22. Post operative echo with stable valve function with a mean gradient at , peak , and AVA by VTI at 2.16cm2. He had two very brief pauses  on telemetry post procedure. Home Toprol was held. ZIO AT placed prior to discharge. He was continued on Plavix and Eliquis given recent cardiac stenting.   Today he presents alone and has his daughter on speaker phone. He is doing well since discharge with no chest pain, palpitations, bleeding in stool or urine, SOB, LE edema, orthopnea, dizziness, or syncope. He has improved SOB and fatigue since valve replacement. He initially denied CRII in the hospital however has been rethinking this and would like to try. He is mostly concerned about his orthopedic issues.   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 Clifton James and Dr. Laneta Simmers   Type II or unspecified type diabetes mellitus without mention of complication, not stated as uncontrolled    Unspecified essential hypertension     Past Surgical History:  Procedure Laterality Date   ATRIAL FIBRILLATION ABLATION N/A 03/17/2020   Procedure: ATRIAL FIBRILLATION ABLATION;  Surgeon:  Regan Lemming, MD;  Location: [facility] INVASIVE CV LAB;  Service: Cardiovascular;  Laterality: N/A;   CARDIOVERSION N/A 09/22/2019   Procedure: CARDIOVERSION;  Surgeon: [name], MD;  Location: [facility] ENDOSCOPY;  Service: Endoscopy;  Laterality: N/A;   CARDIOVERSION N/A 10/18/2019   Procedure: CARDIOVERSION;  Surgeon: Jake Bathe, MD;  Location: [facility] ENDOSCOPY;  Service: Cardiovascular;  Laterality: N/A;   CATARACT EXTRACTION, BILATERAL  08/2020   CORONARY ATHERECTOMY N/A 10/04/2022   Procedure: CORONARY ATHERECTOMY;  Surgeon: Kathleene Hazel, MD;  Location: [facility] INVASIVE CV LAB;  Service: Cardiovascular;  Laterality: N/A;   CORONARY STENT  INTERVENTION N/A 10/04/2022   Procedure: CORONARY STENT INTERVENTION;  Surgeon: Kathleene Hazel, MD;  Location: [facility] INVASIVE CV LAB;  Service: Cardiovascular;  Laterality: N/A;   INTRAOPERATIVE TRANSTHORACIC ECHOCARDIOGRAM N/A 11/26/2022   Procedure: INTRAOPERATIVE TRANSTHORACIC ECHOCARDIOGRAM;  Surgeon: Kathleene Hazel, MD;  Location: [facility] INVASIVE CV LAB;  Service: Open Heart Surgery;  Laterality: N/A;   RIGHT/LEFT HEART CATH AND CORONARY ANGIOGRAPHY N/A 08/29/2022   Procedure: RIGHT/LEFT HEART CATH AND CORONARY ANGIOGRAPHY;  Surgeon: Kathleene Hazel, MD;  Location: [facility] INVASIVE CV LAB;  Service: Cardiovascular;  Laterality: N/A;   TEE WITHOUT CARDIOVERSION N/A 09/22/2019   Procedure: TRANSESOPHAGEAL ECHOCARDIOGRAM (TEE);  Surgeon: [name], MD;  Location: [facility] ENDOSCOPY;  Service: Endoscopy;  Laterality: N/A;   TONSILLECTOMY     TRANSCATHETER AORTIC VALVE REPLACEMENT, TRANSFEMORAL N/A 11/26/2022   Procedure: Transcatheter Aortic Valve Replacement, Transfemoral;  Surgeon: Kathleene Hazel, MD;  Location: [facility] INVASIVE CV LAB;  Service: Open Heart Surgery;  Laterality: N/A;    Current Medications: Current Meds  Medication Sig   amoxicillin (AMOXIL) 500 MG tablet Take 4 tablets (2,000 mg total) by mouth as directed. 1 HOUR PRIOR TO DENTAL APPOINTMENTS   atorvastatin (LIPITOR) 80 MG tablet Take 1 tablet (80 mg total) by mouth every evening.   B-D ULTRAFINE III SHORT PEN 31G X 8 MM MISC SMARTSIG:Pre-Filled Pen Syringe SUB-Q 3 Times Daily   Cholecalciferol (VITAMIN D3) 50 MCG (2000 UT) TABS Take 2,000 Units by mouth at bedtime.   clopidogrel (PLAVIX) 75 MG tablet Take 1 tablet (75 mg total) by mouth daily.   ELIQUIS 5 MG TABS tablet TAKE 1 TABLET(5 MG) BY MOUTH TWICE DAILY   fluticasone-salmeterol (ADVAIR) 250-50 MCG/ACT AEPB INHALE 1 PUFF INTO THE LUNGS IN THE MORNING AND AT BEDTIME (Patient taking differently: Inhale 1 puff into the lungs in the morning.)    furosemide (LASIX) 40 MG tablet Take 1 tablet (40 mg total) by mouth daily.   Glucosamine HCl 1000 MG TABS Take 1,000 mg by mouth in the morning and at bedtime.   Multiple Vitamin (MULTIVITAMIN WITH MINERALS) TABS tablet Take 1 tablet by mouth daily.   MYRBETRIQ 25 MG TB24 tablet Take 25 mg by mouth in the morning.   neomycin-bacitracin-polymyxin 3.5-4011895034 OINT Apply 1 Application topically as needed (rash/skin irritation.).   NOVOLOG MIX 70/30 FLEXPEN (70-30) 100 UNIT/ML FlexPen Inject 30 Units into the skin in the morning and at bedtime.   ONETOUCH VERIO test strip 1 each by Other route in the morning, at noon, and at bedtime.    polyethylene glycol (MIRALAX / GLYCOLAX) 17 g packet Take 17 g by mouth daily.   sertraline (ZOLOFT) 100 MG tablet Take 100 mg by mouth in the morning.   Sodium Fluoride (PREVIDENT 5000 BOOSTER DT) Place 1 Application onto teeth in the morning and at bedtime.     Allergies:  Patient has no known allergies.   Social History   Socioeconomic History   Marital status: Divorced    Spouse name: Not on file   Number of children: 1   Years of education: Not on file   Highest education level: Professional school degree (e.g., MD, DDS, DVM, JD)  Occupational History   Occupation: Defense attorney  Tobacco Use   Smoking status: Never   Smokeless tobacco: Never  Vaping Use   Vaping Use: Never used  Substance and Sexual Activity   Alcohol use: Not Currently    Comment: Used to drink 2 L bottle of wine/day. Quit 09/18/19   Drug use: Never   Sexual activity: Not Currently  Other Topics Concern   Not on file  Social History Narrative   Not on file   Social Determinants of Health   Financial Resource Strain: Not on file  Food Insecurity: No Food Insecurity (10/04/2022)   Hunger Vital Sign    Worried About Running Out of Food in the Last Year: Never true    Ran Out of Food in the Last Year: Never true  Transportation Needs: No Transportation Needs  (10/04/2022)   PRAPARE - Administrator, Civil ServiceTransportation    Lack of Transportation (Medical): No    Lack of Transportation (Non-Medical): No  Physical Activity: Not on file  Stress: Not on file  Social Connections: Not on file    Family History: The patient's family history includes Colon cancer in his father; Heart disease in his paternal grandfather.  ROS:   Please see the history of present illness.    All other systems reviewed and are negative.  EKGs/Labs/Other Studies Reviewed:    The following studies were reviewed today:  TAVR OPERATIVE NOTE     Date of Procedure:                11/26/2022   Preoperative Diagnosis:      Severe Aortic Stenosis    Postoperative Diagnosis:    Same    Procedure:        Transcatheter Aortic Valve Replacement - Percutaneous Right Transfemoral Approach             Edwards Sapien 3 Ultra Resilia THV (size 29 mm, model # 9755RSL, serial # [id number])              Co-Surgeons:                        Alleen BorneBryan K. Bartle, MD and Verne Carrowhristopher McAlhany, MD     Anesthesiologist:                  Glade Stanford. Fitzgerald, MD   Echocardiographer:              Burna FortsP. Nishan, MD   Pre-operative Echo Findings: Severe aortic stenosis Normal left ventricular systolic function   Post-operative Echo Findings: No paravalvular leak Normal left ventricular systolic function ____________   Echo 11/27/22:    1. Compared to 08/01/23 TAVR is new with normally functioning prosthetic  valve with mean gradient 9 mmHg and no AI.   2. Left ventricular ejection fraction, by estimation, is 60 to 65%. The  left ventricle has normal function. The left ventricle has no regional  wall motion abnormalities. There is mild left ventricular hypertrophy.  Left ventricular diastolic parameters  are consistent with Grade II diastolic dysfunction (pseudonormalization).  Elevated left atrial pressure.   3. Right ventricular systolic function is normal. The right ventricular  size  is normal. Tricuspid  regurgitation signal is inadequate for assessing  PA pressure.   4. The mitral valve is normal in structure. Trivial mitral valve  regurgitation. No evidence of mitral stenosis. Moderate mitral annular  calcification.   5. The aortic valve has been repaired/replaced. Aortic valve  regurgitation is not visualized. No aortic stenosis is present. There is a  29 mm Edwards Sapien prosthetic (TAVR) valve present in the aortic  position. Procedure Date: 11/26/22. Echo findings  are consistent with normal structure and function of the aortic valve  prosthesis.   6. The inferior vena cava is dilated in size with >50% respiratory  variability, suggesting right atrial pressure of 8 mmHg.    EKG:  EKG is ordered today.  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  Recent Labs: 11/22/2022: ALT 17 11/27/2022: BUN 21; Creatinine, Ser 1.33; Hemoglobin 11.1; Magnesium 1.8; Platelets 153; Potassium 4.2; Sodium 141   Recent Lipid Panel    Component Value Date/Time   CHOL 96 12/04/2019 0503   TRIG 59 12/04/2019 0503   HDL 32 (L) 12/04/2019 0503   CHOLHDL 3.0 12/04/2019 0503   VLDL 12 12/04/2019 0503   LDLCALC 52 12/04/2019 0503   Physical Exam:    VS:  BP (!) 104/56   Pulse 81   Ht 6\' 2"  (1.88 m)   Wt 295 lb 9.6 oz (134.1 kg)   SpO2 95%   BMI 37.95 kg/m     Wt Readings from Last 3 Encounters:  12/02/22 295 lb 9.6 oz (134.1 kg)  11/27/22 294 lb 3.2 oz (133.4 kg)  10/30/22 290 lb (131.5 kg)    General: Well developed, well nourished, NAD Lungs:Clear to ausculation bilaterally. No wheezes, rales, or rhonchi. Breathing is unlabored. Cardiovascular: RRR with S1 S2. No murmurs Extremities: No edema. Groin sites stable.  Neuro: Alert and oriented. No focal deficits. No facial asymmetry. MAE spontaneously. Psych: Responds to questions appropriately with normal affect.    ASSESSMENT/PLAN:    Severe AS: s/p successful TAVR with a 29 mm Edwards Sapien 3 THV via  the TF approach on 11/26/22. Post operative echo with stable valve function with a mean gradient at , peak , and AVA by VTI at 2.16cm2. Groin sites remain stable. 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 Toprol held. ZIO AT placed with no recurrent pausing on ZIO suite review today. If no pausing with full 2 week monitor, plan to restart Toprol. He will require lifelong dental SBE; Amoxicillin sent to preferred pharm. Plan 1 month follow up with repeat echo.    Chronic diastolic CHF: Appears euvolemic today.    DM: Resumed on home regimen. Follow with PCP    HTN: Toprol held at d/c given pausing on telemetry. If no pausing on ZIO after full wear duration, will restart at next follow up if BP allows. BO today at 104/56.   Hyperlipidemia: Continue statin.    Paroxysmal atrial fibrillation: Maintaining NSR with stable rates. Continue Eliquis. Restart Toprol if BP tolerates and no pausing on ZIO at next follow up.    Prior CVA: No new neuro changes.    CKD stage IIIa: Cr at baseline @ 1.1-1.3.    CAD s/p recent atherectomy 09/2022: Previously on triple therapy with Eliquis, ASA and Plavix. Discussed with Dr. Clifton James with plans to continue Plavix and Eliquis. Will clarify with Dr. Clifton James however will likely continue this until 09/2023 then Eliquis and ASA versus Eliquis  monotherapy. Denies anginal symptoms.    Medication Adjustments/Labs and Tests Ordered: Current medicines are reviewed at length with the patient today.  Concerns regarding medicines are outlined above.  Orders Placed This Encounter  Procedures   EKG 12-Lead   Meds ordered this encounter  Medications   amoxicillin (AMOXIL) 500 MG tablet    Sig: Take 4 tablets (2,000 mg total) by mouth as directed. 1 HOUR PRIOR TO DENTAL APPOINTMENTS    Dispense:  12 tablet    Refill:  6    Patient Instructions  Medication Instructions:  Your physician has recommended you make the following change in your  medication:  START AMOXICILLIN 500 MG - TAKE 2000 MG (4 TABLETS) 1 HOUR PRIOR DENTAL CLEANING AND PROCEDURES.   *If you need a refill on your cardiac medications before your next appointment, please call your pharmacy*   Lab Work: NONE If you have labs (blood work) drawn today and your tests are completely normal, you will receive your results only by: MyChart Message (if you have MyChart) OR A paper copy in the mail If you have any lab test that is abnormal or we need to change your treatment, we will call you to review the results.   Testing/Procedures: NONE   Follow-Up: At [facility], you and your health needs are our priority.  As part of our continuing mission to provide you with exceptional heart care, we have created designated Provider Care Teams.  These Care Teams include your primary Cardiologist (physician) and Advanced Practice Providers (APPs -  Physician Assistants and Nurse Practitioners) who all work together to provide you with the care you need, when you need it.  We recommend signing up for the patient portal called "MyChart".  Sign up information is provided on this After Visit Summary.  MyChart is used to connect with patients for Virtual Visits (Telemedicine).  Patients are able to view lab/test results, encounter notes, upcoming appointments, etc.  Non-urgent messages can be sent to your provider as well.   To learn more about what you can do with MyChart, go to [email or web address].    Your next appointment:   KEEP SCHEDULED FOLLOW-UP   Signed, Georgie Chard, NP  12/03/2022 7:05 AM    [facility]

## 2022-12-02 ENCOUNTER — Ambulatory Visit: Payer: Medicare Other | Attending: Cardiology | Admitting: Cardiology

## 2022-12-02 VITALS — BP 104/56 | HR 81 | Ht 74.0 in | Wt 295.6 lb

## 2022-12-02 DIAGNOSIS — I4891 Unspecified atrial fibrillation: Secondary | ICD-10-CM | POA: Diagnosis not present

## 2022-12-02 DIAGNOSIS — I639 Cerebral infarction, unspecified: Secondary | ICD-10-CM | POA: Insufficient documentation

## 2022-12-02 DIAGNOSIS — Z952 Presence of prosthetic heart valve: Secondary | ICD-10-CM

## 2022-12-02 DIAGNOSIS — N1831 Chronic kidney disease, stage 3a: Secondary | ICD-10-CM | POA: Diagnosis not present

## 2022-12-02 DIAGNOSIS — I25110 Atherosclerotic heart disease of native coronary artery with unstable angina pectoris: Secondary | ICD-10-CM | POA: Diagnosis not present

## 2022-12-02 DIAGNOSIS — I350 Nonrheumatic aortic (valve) stenosis: Secondary | ICD-10-CM | POA: Insufficient documentation

## 2022-12-02 DIAGNOSIS — I498 Other specified cardiac arrhythmias: Secondary | ICD-10-CM | POA: Diagnosis not present

## 2022-12-02 DIAGNOSIS — E782 Mixed hyperlipidemia: Secondary | ICD-10-CM | POA: Diagnosis not present

## 2022-12-02 MED ORDER — AMOXICILLIN 500 MG PO TABS: 2000.0000 mg | ORAL_TABLET | ORAL | 6 refills | Status: AC

## 2022-12-02 NOTE — Patient Instructions (Signed)
Medication Instructions:  Your physician has recommended you make the following change in your medication:  START AMOXICILLIN 500 MG - TAKE 2000 MG (4 TABLETS) 1 HOUR PRIOR DENTAL CLEANING AND PROCEDURES.   *If you need a refill on your cardiac medications before your next appointment, please call your pharmacy*   Lab Work: NONE If you have labs (blood work) drawn today and your tests are completely normal, you will receive your results only by: MyChart Message (if you have MyChart) OR A paper copy in the mail If you have any lab test that is abnormal or we need to change your treatment, we will call you to review the results.   Testing/Procedures: NONE   Follow-Up: At [facility], you and your health needs are our priority.  As part of our continuing mission to provide you with exceptional heart care, we have created designated Provider Care Teams.  These Care Teams include your primary Cardiologist (physician) and Advanced Practice Providers (APPs -  Physician Assistants and Nurse Practitioners) who all work together to provide you with the care you need, when you need it.  We recommend signing up for the patient portal called "MyChart".  Sign up information is provided on this After Visit Summary.  MyChart is used to connect with patients for Virtual Visits (Telemedicine).  Patients are able to view lab/test results, encounter notes, upcoming appointments, etc.  Non-urgent messages can be sent to your provider as well.   To learn more about what you can do with MyChart, go to [email or web address].    Your next appointment:   KEEP SCHEDULED FOLLOW-UP

## 2022-12-19 NOTE — Addendum Note (Signed)
Encounter addended by: Andee Lineman A on: 12/19/2022 4:42 PM  Actions taken: Imaging Exam ended

## 2022-12-31 NOTE — Progress Notes (Unsigned)
[facility]   MULTIDISCIPLINARY HEART VALVE CLINIC                                     Cardiology Office Note:    Date:  01/01/2023   ID:  Brandon Sanchez, DOB 1946/02/26, MRN [id number]  PCP:  Brandon Sable, MD  [facility] Cardiologist:  Brandon Red, MD/ Dr. Clifton James, MD and Dr. Laneta Simmers, MD (TAVR) [facility] Electrophysiologist:  Brandon Brandon Loa, MD   Referring MD: Brandon Sable, MD   Chief Complaint  Patient presents with   Follow-up    1 month s/p TAVR   History of Present Illness:    Brandon Sanchez is a 77 y.o. male with a hx of chronic diastolic CHF, asthma, DM, HTN, hyperlipidemia, paroxysmal atrial fibrillation, prior CVA, nephrolithiasis, CKD, CAD s/p PCI LAD 2/16, and severe aortic stenosis who is now s/p TAVR 11/26/22 and is being seen today for one month follow up.    [name] is followed by Dr. Cristal Sanchez for his cardiology care. He was referred to the structural heart team after being seen with worsening symptoms of SOB and fatigue. Echocardiogram showed severe aortic stenosis with G3 restrictive filling, mean gradient , peak 77.47mmHg, and AVA by VTI at 0.59cm2. R/LHC showed pRCA to mRCA lesion is 30% stenosed and mLAD lesion at 80% and is s/p orbital atherectomy with PTCA/DES x 1 mid LAD. He was seen in follow up s/p PCI with plans to pursue TAVR CT imaging that showed P: 85.4 mm, A: 563 mm2. Cor suff height. Ca2+ score 3101. MSL: 10.2 mm. Deployment angle: RAO 3 CRA 2. Bulky calcification of the LCC and mild annular calcium under the LCC.    He was then evaluated by the multidisciplinary valve team and felt to have severe, symptomatic aortic stenosis and to be a suitable candidate and is now s/p successful TAVR with a 29 mm Edwards Sapien 3 THV via the TF approach on 11/26/22. Post operative echo with stable valve function with a mean gradient at , peak , and AVA by VTI at 2.16cm2. He had two very brief pauses  on telemetry post procedure. Home Toprol was held. ZIO AT placed prior to discharge. He was continued on Plavix and Eliquis given recent cardiac stenting.    In follow up, he was doing well with no complaints. ZIO Suite reviewed with no recurrent pausing however continued to hold BB. Today he is here alone and reports that he has continued to do well with no chest pain, palpitations, bleeding in stool or urine, SOB, LE edema, orthopnea, dizziness, or syncope. Final monitor results reviewed today which showed no further pausing or HAVB. There were intermittent SVEs noted but <1%. Echocardiogram performed today which shows normal LV function with a mean gradient at , peak 24.14mmHg, and AVA by VTI at 3.18cm2.   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 Clifton Sanchez and Dr. Laneta Sanchez   Type II or unspecified type diabetes mellitus without mention of complication, not stated as uncontrolled    Unspecified  essential hypertension     Past Surgical History:  Procedure Laterality Date   ATRIAL FIBRILLATION ABLATION N/A 03/17/2020   Procedure: ATRIAL FIBRILLATION ABLATION;  Surgeon: Regan Lemming, MD;  [facility]: [facility] INVASIVE CV LAB;  Service: Cardiovascular;  Laterality: N/A;   CARDIOVERSION N/A 09/22/2019   Procedure: CARDIOVERSION;  Surgeon: [name], MD;  Location: [facility] ENDOSCOPY;  Service: Endoscopy;  Laterality: N/A;   CARDIOVERSION N/A 10/18/2019   Procedure: CARDIOVERSION;  Surgeon: Jake Bathe, MD;  Location: [facility] ENDOSCOPY;  Service: Cardiovascular;  Laterality: N/A;   CATARACT EXTRACTION, BILATERAL  08/2020   CORONARY ATHERECTOMY N/A 10/04/2022   Procedure:  CORONARY ATHERECTOMY;  Surgeon: Kathleene Hazel, MD;  Location: [facility] INVASIVE CV LAB;  Service: Cardiovascular;  Laterality: N/A;   CORONARY STENT INTERVENTION N/A 10/04/2022   Procedure: CORONARY STENT INTERVENTION;  Surgeon: Kathleene Hazel, MD;  Location: [facility] INVASIVE CV LAB;  Service: Cardiovascular;  Laterality: N/A;   INTRAOPERATIVE TRANSTHORACIC ECHOCARDIOGRAM N/A 11/26/2022   Procedure: INTRAOPERATIVE TRANSTHORACIC ECHOCARDIOGRAM;  Surgeon: Kathleene Hazel, MD;  Location: [facility] INVASIVE CV LAB;  Service: Open Heart Surgery;  Laterality: N/A;   RIGHT/LEFT HEART CATH AND CORONARY ANGIOGRAPHY N/A 08/29/2022   Procedure: RIGHT/LEFT HEART CATH AND CORONARY ANGIOGRAPHY;  Surgeon: Kathleene Hazel, MD;  Location: [facility] INVASIVE CV LAB;  Service: Cardiovascular;  Laterality: N/A;   TEE WITHOUT CARDIOVERSION N/A 09/22/2019   Procedure: TRANSESOPHAGEAL ECHOCARDIOGRAM (TEE);  Surgeon: [name], MD;  Location: [facility] ENDOSCOPY;  Service: Endoscopy;  Laterality: N/A;   TONSILLECTOMY     TRANSCATHETER AORTIC VALVE REPLACEMENT, TRANSFEMORAL N/A 11/26/2022   Procedure: Transcatheter Aortic Valve Replacement, Transfemoral;  Surgeon: Kathleene Hazel, MD;  Location: [facility] INVASIVE CV LAB;  Service: Open Heart Surgery;  Laterality: N/A;    Current Medications: Current Meds  Medication Sig   amoxicillin (AMOXIL) 500 MG tablet Take 4 tablets (2,000 mg total) by mouth as directed. 1 HOUR PRIOR TO DENTAL APPOINTMENTS   atorvastatin (LIPITOR) 80 MG tablet Take 1 tablet (80 mg total) by mouth every evening.   B-D ULTRAFINE III SHORT PEN 31G X 8 MM MISC SMARTSIG:Pre-Filled Pen Syringe SUB-Q 3 Times Daily   Cholecalciferol (VITAMIN D3) 50 MCG (2000 UT) TABS Take 2,000 Units by mouth at bedtime.   clopidogrel (PLAVIX) 75 MG tablet Take 1 tablet (75 mg total) by mouth daily.   ELIQUIS 5 MG TABS tablet TAKE 1 TABLET(5 MG) BY MOUTH TWICE DAILY   fluticasone-salmeterol (ADVAIR) 250-50  MCG/ACT AEPB Inhale 1 puff into the lungs as needed (SOB).   furosemide (LASIX) 40 MG tablet Take 1 tablet (40 mg total) by mouth daily.   Glucosamine HCl 1000 MG TABS Take 1,000 mg by mouth in the morning and at bedtime.   metoprolol succinate (TOPROL XL) 25 MG 24 hr tablet Take 0.5 tablets (12.5 mg total) by mouth daily.   Multiple Vitamin (MULTIVITAMIN WITH MINERALS) TABS tablet Take 1 tablet by mouth daily.   MYRBETRIQ 25 MG TB24 tablet Take 25 mg by mouth in the morning.   neomycin-bacitracin-polymyxin [phone] OINT Apply 1 Application topically as needed (rash/skin irritation.).   NOVOLOG MIX 70/30 FLEXPEN (70-30) 100 UNIT/ML FlexPen Inject 30 Units into the skin in the morning and at bedtime.   ONETOUCH VERIO test strip 1 each by Other route in the morning, at noon, and at bedtime.    polyethylene glycol (MIRALAX / GLYCOLAX) 17 g packet Take 17 g by mouth daily.   sertraline (ZOLOFT) 100 MG tablet Take  100 mg by mouth in the morning.   Sodium Fluoride (PREVIDENT 5000 BOOSTER DT) Place 1 Application onto teeth in the morning and at bedtime.     Allergies:   Patient has no known allergies.   Social History   Socioeconomic History   Marital status: Divorced    Spouse name: Not on file   Number of children: 1   Years of education: Not on file   Highest education level: Professional school degree (e.g., MD, DDS, DVM, JD)  Occupational History   Occupation: Defense attorney  Tobacco Use   Smoking status: Never   Smokeless tobacco: Never  Vaping Use   Vaping Use: Never used  Substance and Sexual Activity   Alcohol use: Not Currently    Comment: Used to drink 2 L bottle of wine/day. Quit 09/18/19   Drug use: Never   Sexual activity: Not Currently  Other Topics Concern   Not on file  Social History Narrative   Not on file   Social Determinants of Health   Financial Resource Strain: Not on file  Food Insecurity: No Food Insecurity (10/04/2022)   Hunger Vital Sign     Worried About Running Out of Food in the Last Year: Never true    Ran Out of Food in the Last Year: Never true  Transportation Needs: No Transportation Needs (10/04/2022)   PRAPARE - Administrator, Civil Service (Medical): No    Lack of Transportation (Non-Medical): No  Physical Activity: Not on file  Stress: Not on file  Social Connections: Not on file     Family History: The patient's family history includes Colon cancer in his father; Heart disease in his paternal grandfather.  ROS:   Please see the history of present illness.    All other systems reviewed and are negative.  EKGs/Labs/Other Studies Reviewed:    The following studies were reviewed today:  TAVR OPERATIVE NOTE     Date of Procedure:                11/26/2022   Preoperative Diagnosis:      Severe Aortic Stenosis    Postoperative Diagnosis:    Same    Procedure:        Transcatheter Aortic Valve Replacement - Percutaneous Right Transfemoral Approach             Edwards Sapien 3 Ultra Resilia THV (size 29 mm, model # 9755RSL, serial # [id number])              Co-Surgeons:                        Alleen Borne, MD and Verne Carrow, MD     Anesthesiologist:                  Glade Stanford, MD   Echocardiographer:              Burna Forts, MD   Pre-operative Echo Findings: Severe aortic stenosis Normal left ventricular systolic function   Post-operative Echo Findings: No paravalvular leak Normal left ventricular systolic function ____________   Echo 11/27/22:    1. Compared to 08/01/23 TAVR is new with normally functioning prosthetic  valve with mean gradient 9 mmHg and no AI.   2. Left ventricular ejection fraction, by estimation, is 60 to 65%. The  left ventricle has normal function. The left ventricle has no regional  wall motion abnormalities. There is mild left ventricular  hypertrophy.  Left ventricular diastolic parameters  are consistent with Grade II diastolic dysfunction  (pseudonormalization).  Elevated left atrial pressure.   3. Right ventricular systolic function is normal. The right ventricular  size is normal. Tricuspid regurgitation signal is inadequate for assessing  PA pressure.   4. The mitral valve is normal in structure. Trivial mitral valve  regurgitation. No evidence of mitral stenosis. Moderate mitral annular  calcification.   5. The aortic valve has been repaired/replaced. Aortic valve  regurgitation is not visualized. No aortic stenosis is present. There is a  29 mm Edwards Sapien prosthetic (TAVR) valve present in the aortic  position. Procedure Date: 11/26/22. Echo findings  are consistent with normal structure and function of the aortic valve  prosthesis.   6. The inferior vena cava is dilated in size with >50% respiratory  variability, suggesting right atrial pressure of 8 mmHg.    ZIO AT 12/19/22:   Patch Wear Time:  13 days and 13 hours (2024-04-10T09:38:13-0400 to 2024-04-23T23:30:59-0400)   Patient had a min HR of 52 bpm, max HR of 169 bpm, and avg HR of 75 bpm. Predominant underlying rhythm was Sinus Rhythm. First Degree AV Block was present. Slight P wave morphology changes were noted. 37 Supraventricular Tachycardia runs occurred, the  run with the fastest interval lasting 7 beats with a max rate of 169 bpm, the longest lasting 20 beats with an avg rate of 101 bpm. Isolated SVEs were rare (<1.0%), SVE Couplets were rare (<1.0%), and SVE Triplets were rare (<1.0%). Isolated VEs were  rare (<1.0%), VE Couplets were rare (<1.0%), and no VE Triplets were present   Echocardiogram 01/01/23:   1. Left ventricular ejection fraction, by estimation, is 65 to 70%. The  left ventricle has normal function. The left ventricle has no regional  wall motion abnormalities. Left ventricular diastolic parameters are  consistent with Grade II diastolic  dysfunction (pseudonormalization). The average left ventricular global  longitudinal strain is  -19.7 %. The global longitudinal strain is normal.   2. Right ventricular systolic function is normal. The right ventricular  size is normal.   3. Left atrial size was mildly dilated.   4. The mitral valve is grossly normal. Trivial mitral valve  regurgitation. No evidence of mitral stenosis.   5. The aortic valve has been repaired/replaced. Aortic valve  regurgitation is not visualized. There is a 29 mm Sapien prosthetic (TAVR)  valve present in the aortic position. Echo findings are consistent with  normal structure and function of the aortic  valve prosthesis. Aortic valve mean gradient measures 13.0 mmHg. Aortic  valve Vmax measures 2.47 m/s. DI 0.5. Gradients slightly elevated compared  to prior TTE in the setting of vigorous LV systolic function.   6. The inferior vena cava is normal in size with greater than 50%  respiratory variability, suggesting right atrial pressure of 3 mmHg.   Comparison(s): Prior TTE 11/2022 with mean AoV gradient , DI 0.6, EF  60-65%, no AR.   EKG:  EKG is not ordered today.   Recent Labs: 11/22/2022: ALT 17 11/27/2022: BUN 21; Creatinine, Ser 1.33; Hemoglobin 11.1; Magnesium 1.8; Platelets 153; Potassium 4.2; Sodium 141   Recent Lipid Panel    Component Value Date/Time   CHOL 96 12/04/2019 0503   TRIG 59 12/04/2019 0503   HDL 32 (L) 12/04/2019 0503   CHOLHDL 3.0 12/04/2019 0503   VLDL 12 12/04/2019 0503   LDLCALC 52 12/04/2019 0503    Physical Exam:    VS:  BP 124/72   Pulse 83   Ht 6\' 2"  (1.88 m)   Wt 295 lb (133.8 kg)   SpO2 96%   BMI 37.88 kg/m     Wt Readings from Last 3 Encounters:  01/01/23 295 lb (133.8 kg)  12/02/22 295 lb 9.6 oz (134.1 kg)  11/27/22 294 lb 3.2 oz (133.4 kg)    General: Well developed, well nourished, NAD Lungs:Clear to ausculation bilaterally. No wheezes, rales, or rhonchi. Breathing is unlabored. Cardiovascular: RRR with S1 S2. No murmurs Extremities: No edema.  Neuro: Alert and oriented. No focal  deficits. No facial asymmetry. MAE spontaneously. Psych: Responds to questions appropriately with normal affect.    ASSESSMENT/PLAN:    Severe AS: Patient doing well with NYHA class I symptoms s/p successful TAVR with a 29 mm Edwards Sapien 3 THV via the TF approach on 11/26/22. Echo today with normal LV function with a mean gradient at , peak 24.64mmHg, and AVA by VTI at 3.18cm2. Continue Plavix and Eliquis (recent PCI with atherectomy) through 10/05/2023 if tolerating then Eliquis alone. He Brandon require lifelong dental SBE and has Amoxicillin 2g. Plan follow up with Dr. Cristal Sanchez in 6 months and our team in one year with repeat echo.    Chronic diastolic CHF: Appears euvolemic today. Continue Lasix 40mg  QD.    DM: Follow with PCP    HTN: Toprol held s/p TAVR given brief pausing on telemetry. ZIO AT with no recurrence therefore low dose Toprol 12.5mg  QD added back to regimen today given CAD and PAF.     Hyperlipidemia: Continue statin.    Paroxysmal atrial fibrillation: Maintaining NSR. Continue Eliquis. Restart Toprol 12.5mg  QD today.  Prior CVA: No new neuro changes.    CKD stage IIIa: Cr at baseline @ 1.1-1.3.    CAD s/p recent atherectomy 09/2022: Previously on triple therapy with Eliquis, ASA and Plavix. Discussed with Dr. Clifton Sanchez with plans to continue Plavix and Eliquis through 09/2023 then Eliquis alone. Denies anginal symptoms or bleeding in stool or urine.     Medication Adjustments/Labs and Tests Ordered: Current medicines are reviewed at length with the patient today.  Concerns regarding medicines are outlined above.  No orders of the defined types were placed in this encounter.  Meds ordered this encounter  Medications   metoprolol succinate (TOPROL XL) 25 MG 24 hr tablet    Sig: Take 0.5 tablets (12.5 mg total) by mouth daily.    Dispense:  45 tablet    Refill:  3    Patient Instructions  Medication Instructions:  Your physician has recommended you make the  following change in your medication:  START TOPROL 12.5 MG DAILY.   *If you need a refill on your cardiac medications before your next appointment, please call your pharmacy*   Lab Work: NONE If you have labs (blood work) drawn today and your tests are completely normal, you Brandon receive your results only by: MyChart Message (if you have MyChart) OR A paper copy in the mail If you have any lab test that is abnormal or we need to change your treatment, we Brandon call you to review the results.   Testing/Procedures: NONE   Follow-Up: At [facility], you and your health needs are our priority.  As part of our continuing mission to provide you with exceptional heart care, we have created designated Provider Care Teams.  These Care Teams include your primary Cardiologist (physician) and Advanced Practice Providers (APPs -  Physician Assistants and Nurse Practitioners) who  all work together to provide you with the care you need, when you need it.  We recommend signing up for the patient portal called "MyChart".  Sign up information is provided on this After Visit Summary.  MyChart is used to connect with patients for Virtual Visits (Telemedicine).  Patients are able to view lab/test results, encounter notes, upcoming appointments, etc.  Non-urgent messages can be sent to your provider as well.   To learn more about what you can do with MyChart, go to [email or web address].    Your next appointment:   MOVE DR. Di Kindle APPOINTMENT TO NOVEMBER 2024    Signed, Georgie Chard, NP  01/01/2023 1:03 PM    [facility]

## 2023-01-01 ENCOUNTER — Ambulatory Visit (INDEPENDENT_AMBULATORY_CARE_PROVIDER_SITE_OTHER): Payer: Medicare Other | Admitting: Cardiology

## 2023-01-01 ENCOUNTER — Other Ambulatory Visit: Payer: Self-pay | Admitting: Cardiology

## 2023-01-01 ENCOUNTER — Ambulatory Visit: Payer: Medicare Other | Attending: Cardiology

## 2023-01-01 VITALS — BP 124/72 | HR 83 | Ht 74.0 in | Wt 295.0 lb

## 2023-01-01 DIAGNOSIS — I4891 Unspecified atrial fibrillation: Secondary | ICD-10-CM | POA: Insufficient documentation

## 2023-01-01 DIAGNOSIS — N1831 Chronic kidney disease, stage 3a: Secondary | ICD-10-CM

## 2023-01-01 DIAGNOSIS — I498 Other specified cardiac arrhythmias: Secondary | ICD-10-CM | POA: Insufficient documentation

## 2023-01-01 DIAGNOSIS — I10 Essential (primary) hypertension: Secondary | ICD-10-CM

## 2023-01-01 DIAGNOSIS — Z952 Presence of prosthetic heart valve: Secondary | ICD-10-CM | POA: Diagnosis not present

## 2023-01-01 DIAGNOSIS — I639 Cerebral infarction, unspecified: Secondary | ICD-10-CM | POA: Diagnosis not present

## 2023-01-01 DIAGNOSIS — I350 Nonrheumatic aortic (valve) stenosis: Secondary | ICD-10-CM

## 2023-01-01 DIAGNOSIS — I25110 Atherosclerotic heart disease of native coronary artery with unstable angina pectoris: Secondary | ICD-10-CM | POA: Insufficient documentation

## 2023-01-01 LAB — ECHOCARDIOGRAM COMPLETE
AR max vel: 2.49 cm2
AV Area VTI: 3.18 cm2
AV Area mean vel: 2.65 cm2
AV Mean grad: 13 mmHg
AV Peak grad: 24.4 mmHg
Ao pk vel: 2.47 m/s
Area-P 1/2: 2.01 cm2
S' Lateral: 2.2 cm

## 2023-01-01 MED ORDER — METOPROLOL SUCCINATE ER 25 MG PO TB24: 12.5000 mg | ORAL_TABLET | Freq: Every day | ORAL | 3 refills | Status: DC

## 2023-01-01 NOTE — Patient Instructions (Signed)
Medication Instructions:  Your physician has recommended you make the following change in your medication:  START TOPROL 12.5 MG DAILY.   *If you need a refill on your cardiac medications before your next appointment, please call your pharmacy*   Lab Work: NONE If you have labs (blood work) drawn today and your tests are completely normal, you will receive your results only by: MyChart Message (if you have MyChart) OR A paper copy in the mail If you have any lab test that is abnormal or we need to change your treatment, we will call you to review the results.   Testing/Procedures: NONE   Follow-Up: At [facility], you and your health needs are our priority.  As part of our continuing mission to provide you with exceptional heart care, we have created designated Provider Care Teams.  These Care Teams include your primary Cardiologist (physician) and Advanced Practice Providers (APPs -  Physician Assistants and Nurse Practitioners) who all work together to provide you with the care you need, when you need it.  We recommend signing up for the patient portal called "MyChart".  Sign up information is provided on this After Visit Summary.  MyChart is used to connect with patients for Virtual Visits (Telemedicine).  Patients are able to view lab/test results, encounter notes, upcoming appointments, etc.  Non-urgent messages can be sent to your provider as well.   To learn more about what you can do with MyChart, go to [email or web address].    Your next appointment:   MOVE DR. Di Kindle APPOINTMENT TO NOVEMBER 2024

## 2023-01-07 ENCOUNTER — Ambulatory Visit (HOSPITAL_BASED_OUTPATIENT_CLINIC_OR_DEPARTMENT_OTHER): Payer: Medicare Other | Admitting: Cardiology

## 2023-01-17 ENCOUNTER — Telehealth: Payer: Self-pay | Admitting: Cardiology

## 2023-01-17 ENCOUNTER — Other Ambulatory Visit: Payer: Self-pay

## 2023-01-17 MED ORDER — APIXABAN 5 MG PO TABS: ORAL_TABLET | ORAL | 2 refills | Status: DC

## 2023-01-17 NOTE — Telephone Encounter (Signed)
Prescription refill request for Eliquis received. Indication:afib Last office visit:5/24 Scr:1.33  4/24 Age: 77 Weight:133.8  kg  Prescription refilled

## 2023-01-17 NOTE — Telephone Encounter (Signed)
*  STAT* If patient is at the pharmacy, call can be transferred to refill team.   1. Which medications need to be refilled? (please list name of each medication and dose if known)   ELIQUIS 5 MG TABS tablet    2. Which pharmacy/location (including street and city if local pharmacy) is medication to be sent to? [id number] - [place], [place] - [address]    3. Do they need a 30 day or 90 day supply? 90 day   Pt has only 1 tablet left

## 2023-02-04 DIAGNOSIS — E782 Mixed hyperlipidemia: Secondary | ICD-10-CM | POA: Diagnosis not present

## 2023-02-04 DIAGNOSIS — E039 Hypothyroidism, unspecified: Secondary | ICD-10-CM | POA: Diagnosis not present

## 2023-02-04 DIAGNOSIS — E785 Hyperlipidemia, unspecified: Secondary | ICD-10-CM | POA: Diagnosis not present

## 2023-02-04 DIAGNOSIS — E1129 Type 2 diabetes mellitus with other diabetic kidney complication: Secondary | ICD-10-CM | POA: Diagnosis not present

## 2023-02-04 DIAGNOSIS — I10 Essential (primary) hypertension: Secondary | ICD-10-CM | POA: Diagnosis not present

## 2023-03-04 DIAGNOSIS — Z125 Encounter for screening for malignant neoplasm of prostate: Secondary | ICD-10-CM | POA: Diagnosis not present

## 2023-03-04 DIAGNOSIS — N200 Calculus of kidney: Secondary | ICD-10-CM | POA: Diagnosis not present

## 2023-03-04 DIAGNOSIS — R3915 Urgency of urination: Secondary | ICD-10-CM | POA: Diagnosis not present

## 2023-03-06 ENCOUNTER — Telehealth: Payer: Self-pay

## 2023-03-06 NOTE — Telephone Encounter (Signed)
PA request received from patients pharmacy for Levalbuterol HFA, chart shows this med was discontinued and last office visit was Aug 2023. Please advise, thank you!

## 2023-03-07 NOTE — Telephone Encounter (Signed)
PA not needed. Looks like medication was D/C last office visit

## 2023-05-02 DIAGNOSIS — Z23 Encounter for immunization: Secondary | ICD-10-CM | POA: Diagnosis not present

## 2023-05-06 DIAGNOSIS — N200 Calculus of kidney: Secondary | ICD-10-CM | POA: Diagnosis not present

## 2023-05-06 DIAGNOSIS — E1122 Type 2 diabetes mellitus with diabetic chronic kidney disease: Secondary | ICD-10-CM | POA: Diagnosis not present

## 2023-05-06 DIAGNOSIS — I129 Hypertensive chronic kidney disease with stage 1 through stage 4 chronic kidney disease, or unspecified chronic kidney disease: Secondary | ICD-10-CM | POA: Diagnosis not present

## 2023-05-06 DIAGNOSIS — I2510 Atherosclerotic heart disease of native coronary artery without angina pectoris: Secondary | ICD-10-CM | POA: Diagnosis not present

## 2023-05-06 DIAGNOSIS — N1831 Chronic kidney disease, stage 3a: Secondary | ICD-10-CM | POA: Diagnosis not present

## 2023-05-06 DIAGNOSIS — D631 Anemia in chronic kidney disease: Secondary | ICD-10-CM | POA: Diagnosis not present

## 2023-05-06 DIAGNOSIS — I482 Chronic atrial fibrillation, unspecified: Secondary | ICD-10-CM | POA: Diagnosis not present

## 2023-05-07 LAB — LAB REPORT - SCANNED
Albumin, Urine POC: 21.1
Albumin/Creatinine Ratio, Urine, POC: 15
Creatinine, POC: 144.6 mg/dL
EGFR: 26

## 2023-05-09 DIAGNOSIS — N200 Calculus of kidney: Secondary | ICD-10-CM | POA: Diagnosis not present

## 2023-05-13 DIAGNOSIS — N200 Calculus of kidney: Secondary | ICD-10-CM | POA: Diagnosis not present

## 2023-05-15 DIAGNOSIS — N1831 Chronic kidney disease, stage 3a: Secondary | ICD-10-CM | POA: Diagnosis not present

## 2023-05-15 DIAGNOSIS — N390 Urinary tract infection, site not specified: Secondary | ICD-10-CM | POA: Diagnosis not present

## 2023-06-03 DIAGNOSIS — I482 Chronic atrial fibrillation, unspecified: Secondary | ICD-10-CM | POA: Diagnosis not present

## 2023-06-03 DIAGNOSIS — N1831 Chronic kidney disease, stage 3a: Secondary | ICD-10-CM | POA: Diagnosis not present

## 2023-06-03 DIAGNOSIS — I129 Hypertensive chronic kidney disease with stage 1 through stage 4 chronic kidney disease, or unspecified chronic kidney disease: Secondary | ICD-10-CM | POA: Diagnosis not present

## 2023-06-03 DIAGNOSIS — D631 Anemia in chronic kidney disease: Secondary | ICD-10-CM | POA: Diagnosis not present

## 2023-06-03 DIAGNOSIS — E1122 Type 2 diabetes mellitus with diabetic chronic kidney disease: Secondary | ICD-10-CM | POA: Diagnosis not present

## 2023-06-03 DIAGNOSIS — N200 Calculus of kidney: Secondary | ICD-10-CM | POA: Diagnosis not present

## 2023-06-03 DIAGNOSIS — I2510 Atherosclerotic heart disease of native coronary artery without angina pectoris: Secondary | ICD-10-CM | POA: Diagnosis not present

## 2023-06-27 ENCOUNTER — Ambulatory Visit (HOSPITAL_BASED_OUTPATIENT_CLINIC_OR_DEPARTMENT_OTHER): Payer: Medicare Other | Admitting: Cardiology

## 2023-06-27 ENCOUNTER — Encounter (HOSPITAL_BASED_OUTPATIENT_CLINIC_OR_DEPARTMENT_OTHER): Payer: Self-pay | Admitting: Cardiology

## 2023-06-27 VITALS — BP 108/62 | HR 68 | Ht 74.0 in | Wt 295.0 lb

## 2023-06-27 DIAGNOSIS — Z7901 Long term (current) use of anticoagulants: Secondary | ICD-10-CM

## 2023-06-27 DIAGNOSIS — N1831 Chronic kidney disease, stage 3a: Secondary | ICD-10-CM | POA: Diagnosis not present

## 2023-06-27 DIAGNOSIS — Z955 Presence of coronary angioplasty implant and graft: Secondary | ICD-10-CM

## 2023-06-27 DIAGNOSIS — I2510 Atherosclerotic heart disease of native coronary artery without angina pectoris: Secondary | ICD-10-CM | POA: Diagnosis not present

## 2023-06-27 DIAGNOSIS — I350 Nonrheumatic aortic (valve) stenosis: Secondary | ICD-10-CM

## 2023-06-27 DIAGNOSIS — D6869 Other thrombophilia: Secondary | ICD-10-CM

## 2023-06-27 DIAGNOSIS — I10 Essential (primary) hypertension: Secondary | ICD-10-CM | POA: Diagnosis not present

## 2023-06-27 DIAGNOSIS — I480 Paroxysmal atrial fibrillation: Secondary | ICD-10-CM

## 2023-06-27 DIAGNOSIS — Z8673 Personal history of transient ischemic attack (TIA), and cerebral infarction without residual deficits: Secondary | ICD-10-CM | POA: Diagnosis not present

## 2023-06-27 DIAGNOSIS — E782 Mixed hyperlipidemia: Secondary | ICD-10-CM

## 2023-06-27 DIAGNOSIS — Z952 Presence of prosthetic heart valve: Secondary | ICD-10-CM

## 2023-06-27 NOTE — Progress Notes (Signed)
Cardiology Office Note:  .   Date:  06/27/2023  ID:  YUSEI BILL, DOB [birth year], MRN [id number] PCP: Annie Sable, MD   HeartCare Providers Cardiologist:  Jodelle Red, MD Electrophysiologist:  Will Jorja Loa, MD  Structural Heart:  Verne Carrow, MD{  History of Present Illness: .   Brandon Sanchez is a 77 y.o. male with aortic stenosis s/p TAVR 2024, CAD s/p DES/atherectomy 2024, paroxysmal atrial fibrillation, CVA 2021, type II diabetes, hypertension, hyperlipidemia, stabe 3b chronic kidney disease.   Pertinent CV history: S/P TAVR 11/2022. S/P Atherectomy/DES to heavily calcified mid LAD 09/2022. Atrial fibrillation diagnosed 2021 during hospitalization, now s/p ablation.  Today: Reviewed his major medical changes since last visit, including PCI and TAVR.   Sees Dr. Kathrene Bongo at [facility]. Seen last month, told his renal disease was due to cholesterol emboli as a diagnosis of exclusion. Stopped furosemide. Has not had additional swelling since stopping furosemide. No changes in shortness of breath since stopping, mild chronically. Weight has been coming down.  Has been doing minimal activity. We discussed increasing activity gradually. Doing normal daily activity, has some stairs that he climbs usually twice a day (limited by his knees). [facility] [employer] him to return to this.  Has quick twinge in his left chest, 1-2 times/week. Nonlimiting, nonexertional. Has not had any bleeding that he has noticed. Has not noticed afib symptoms recently (cannot feel rhythm but has symptoms).  ROS positive for mild foot soreness/tingling.  ROS: Denies chest pain, shortness of breath. No PND, orthopnea, LE edema or unexpected weight gain. No syncope or palpitations. ROS otherwise negative except as noted.   Studies Reviewed: Marland Kitchen    EKG:       Physical Exam:   VS:  BP 108/62 (BP Location: Left Arm, Patient  Position: Sitting, Cuff Size: Large)   Pulse 68   Ht 6\' 2"  (1.88 m)   Wt 295 lb (133.8 kg)   BMI 37.88 kg/m    Wt Readings from Last 3 Encounters:  06/27/23 295 lb (133.8 kg)  01/01/23 295 lb (133.8 kg)  12/02/22 295 lb 9.6 oz (134.1 kg)    GEN: Well nourished, well developed in no acute distress HEENT: Normal, moist mucous membranes NECK: No JVD CARDIAC: regular rhythm, normal S1 and S2, no rubs or gallops. No significant murmur. VASCULAR: Radial and DP pulses 2+ bilaterally. No carotid bruits RESPIRATORY:  Clear to auscultation without rales, wheezing or rhonchi  ABDOMEN: Soft, non-tender, non-distended MUSCULOSKELETAL:  Ambulates independently SKIN: Warm and dry, no edema NEUROLOGIC:  Alert and oriented x 3. No focal neuro deficits noted. PSYCHIATRIC:  Normal affect    ASSESSMENT AND PLAN: .    aortic stenosis now s/p TAVR -TAVR 11/26/22 with 29 mm Edwards Sapien 3 -has SBE prophylaxis  -currently on apixaban and clopidogrel, see below  CAD s/p atherectomy 09/2022 -mid LAD with 80% stenosis, heavily calcified, s/p orbital atherectomy and DES -Planned to continue on clopidogrel and apixaban through 09/2023, then apixaban alone -continue statin -Lcx without significant disease (arises from separate ostium), RCA with 30% mid stenosis -no recent lipids, ordered today   Paroxysmal atrial fibrillation with RVR:  -In NSR today. -on apixaban for anticoagulation -s/p ablation by Dr. Elberta Fortis.  -had monitor post TAVR, no significant pauses, metoprolol succinate 12.5 mg daily restarted -instructed on red flag warning signs that need immediate medical attention   History of CVA: -on aspirin, apixaban, and atorvastatin   Type  II diabetes: -on insulin, last A1c I can see is 8.3 01/2023 -with history of CVA, as well as BMI 37 would recommend GLP1RA. With CKD 3b, would recommend SGLT2i. He does not think they have discussed this. Scheduled to see Dr. Talmage Nap next month, recommended  discussing with her.  Stage 3b chronic kidney disease: -most recent GFR 26   Possible OSA: sleep study didn't meet criteria for CPAP. Recommended to avoid laying supine.  CV risk counseling and prevention -recommend heart healthy/Mediterranean diet, with whole grains, fruits, vegetable, fish, lean meats, nuts, and olive oil. Limit salt. -recommend moderate walking, 3-5 times/week for 30-50 minutes each session. Aim for at least 150 minutes.week. Goal should be pace of 3 miles/hours, or walking 1.5 miles in 30 minutes -recommend avoidance of tobacco products. Avoid excess alcohol.  Dispo: 6 mos or sooner as needed  I spent 43 minutes dedicated to the care of this patient on the date of this encounter to include pre-visit review of records, face-to-face time with the patient discussing conditions above, post visit ordering of testing, clinical documentation with the electronic health record, making appropriate referrals as documented, and communicating necessary information to the patient's healthcare team.    Signed, Jodelle Red, MD   Jodelle Red, MD, PhD, [facility]  [facility]  [place]  [facility] V[facility] at [facility] at [facility] [address], [address] [place], [place] [address] [phone]

## 2023-06-27 NOTE — Patient Instructions (Addendum)
Medication Instructions:  Your physician recommends that you continue on your current medications as directed. Please refer to the Current Medication list given to you today.   *If you need a refill on your cardiac medications before your next appointment, please call your pharmacy*  Lab Work: LIPIDS TODAY   If you have labs (blood work) drawn today and your tests are completely normal, you will receive your results only by: MyChart Message (if you have MyChart) OR A paper copy in the mail If you have any lab test that is abnormal or we need to change your treatment, we will call you to review the results.  Testing/Procedures: NONE  Follow-Up: At [facility], you and your health needs are our priority.  As part of our continuing mission to provide you with exceptional heart care, we have created designated Provider Care Teams.  These Care Teams include your primary Cardiologist (physician) and Advanced Practice Providers (APPs -  Physician Assistants and Nurse Practitioners) who all work together to provide you with the care you need, when you need it.  We recommend signing up for the patient portal called "MyChart".  Sign up information is provided on this After Visit Summary.  MyChart is used to connect with patients for Virtual Visits (Telemedicine).  Patients are able to view lab/test results, encounter notes, upcoming appointments, etc.  Non-urgent messages can be sent to your provider as well.   To learn more about what you can do with MyChart, go to [email or web address].    Your next appointment:   6 month(s)  Provider:   Jodelle Red, MD or Gillian Shields, NP    Other Instructions  To discuss with Dr. Talmage Nap: with history of CVA, as well as BMI 37 would recommend GLP1RA. With CKD 3b, would recommend SGLT2i.

## 2023-06-28 LAB — LIPID PANEL
Chol/HDL Ratio: 3.2 ratio (ref 0.0–5.0)
Cholesterol, Total: 131 mg/dL (ref 100–199)
HDL: 41 mg/dL (ref >39)
LDL Chol Calc (NIH): 65 mg/dL (ref 0–99)
Triglycerides: 146 mg/dL (ref 0–149)
VLDL Cholesterol Cal: 25 mg/dL (ref 5–40)

## 2023-07-08 ENCOUNTER — Other Ambulatory Visit: Payer: Self-pay | Admitting: Cardiology

## 2023-08-04 DIAGNOSIS — I10 Essential (primary) hypertension: Secondary | ICD-10-CM | POA: Diagnosis not present

## 2023-08-04 DIAGNOSIS — E1129 Type 2 diabetes mellitus with other diabetic kidney complication: Secondary | ICD-10-CM | POA: Diagnosis not present

## 2023-08-04 DIAGNOSIS — E782 Mixed hyperlipidemia: Secondary | ICD-10-CM | POA: Diagnosis not present

## 2023-08-05 DIAGNOSIS — E1122 Type 2 diabetes mellitus with diabetic chronic kidney disease: Secondary | ICD-10-CM | POA: Diagnosis not present

## 2023-08-05 DIAGNOSIS — I129 Hypertensive chronic kidney disease with stage 1 through stage 4 chronic kidney disease, or unspecified chronic kidney disease: Secondary | ICD-10-CM | POA: Diagnosis not present

## 2023-08-05 DIAGNOSIS — I2510 Atherosclerotic heart disease of native coronary artery without angina pectoris: Secondary | ICD-10-CM | POA: Diagnosis not present

## 2023-08-05 DIAGNOSIS — N189 Chronic kidney disease, unspecified: Secondary | ICD-10-CM | POA: Diagnosis not present

## 2023-08-05 DIAGNOSIS — I482 Chronic atrial fibrillation, unspecified: Secondary | ICD-10-CM | POA: Diagnosis not present

## 2023-08-05 DIAGNOSIS — N200 Calculus of kidney: Secondary | ICD-10-CM | POA: Diagnosis not present

## 2023-08-05 DIAGNOSIS — R200 Anesthesia of skin: Secondary | ICD-10-CM | POA: Diagnosis not present

## 2023-08-05 DIAGNOSIS — N1831 Chronic kidney disease, stage 3a: Secondary | ICD-10-CM | POA: Diagnosis not present

## 2023-08-05 DIAGNOSIS — D631 Anemia in chronic kidney disease: Secondary | ICD-10-CM | POA: Diagnosis not present

## 2023-08-06 DIAGNOSIS — E782 Mixed hyperlipidemia: Secondary | ICD-10-CM | POA: Diagnosis not present

## 2023-08-06 DIAGNOSIS — E039 Hypothyroidism, unspecified: Secondary | ICD-10-CM | POA: Diagnosis not present

## 2023-08-06 DIAGNOSIS — I10 Essential (primary) hypertension: Secondary | ICD-10-CM | POA: Diagnosis not present

## 2023-08-06 DIAGNOSIS — E785 Hyperlipidemia, unspecified: Secondary | ICD-10-CM | POA: Diagnosis not present

## 2023-08-06 DIAGNOSIS — E1129 Type 2 diabetes mellitus with other diabetic kidney complication: Secondary | ICD-10-CM | POA: Diagnosis not present

## 2023-09-17 ENCOUNTER — Other Ambulatory Visit: Payer: Self-pay | Admitting: Cardiovascular Disease

## 2023-09-23 DIAGNOSIS — N390 Urinary tract infection, site not specified: Secondary | ICD-10-CM | POA: Diagnosis not present

## 2023-10-26 ENCOUNTER — Encounter: Payer: Self-pay | Admitting: Pulmonary Disease

## 2023-11-23 NOTE — Progress Notes (Addendum)
 HEART AND VASCULAR CENTER   MULTIDISCIPLINARY HEART VALVE CLINIC                                     Cardiology Office Note:    Date:  11/24/2023   ID:  Brandon COVAULT, DOB 1946-04-04, MRN [id number]  PCP:  Nicolas Barren, MD  [facility] Cardiologist:  Sheryle Donning, MD  [facility] Structural heart: Antoinette Batman, MD [facility] Electrophysiologist:  Will Cortland Ding, MD   Referring MD: Nicolas Barren, MD   1 year s/p TAVR  History of Present Illness:    Brandon Sanchez is a 78 y.o. male with a hx of chronic diastolic CHF, asthma, DMT2, HTN, HLD, paroxysmal atrial fibrillation s/p ablation (2021), prior CVA, nephrolithiasis, CKD stage IIIb, CAD s/p PCI LAD 10/04/22, and severe aortic stenosis s/p TAVR 11/26/22 who presents to clinic for follow up.    Mr. Mccorkle is followed by Dr. Veryl Gottron for his cardiology care. He was referred to the structural heart team after being seen with worsening symptoms of SOB and fatigue. Echocardiogram showed severe aortic stenosis with G3DD, and severe AS with a mean gradient , AVA 0.59cm2. R/LHC showed pRCA to mRCA lesion is 30% stenosed and mLAD lesion at 80% and is s/p orbital atherectomy with PTCA/DES x 1 mid LAD on 10/04/22. S/p successful TAVR with a 29 mm Edwards Sapien 3 THV via the TF approach on 11/26/22. Post operative echo with stable valve function with a mean gradient at ,  He had two very brief pauses on telemetry post procedure. Home Toprol  was held. ZIO AT placed which did not show HAVB.   Creat went up to 2.5 in Sept 2024. Lasix  stopped. Dr. Ansel Kingdom told it was from arthrosclerotic emboli after cath and TAVR.   Today the patient presents to clinic for follow up. Daughter, Brandon Sanchez, conferenced in over the phone. He still has some shortness of breath with exertion, even just walking across he room. Daughter thinks this got a lot better after the valve surgery but worse recently. He also has  LE edema in feet and ankles which is chronic but worse after stopping Lasix . No CP. No orthopnea or PND. No dizziness or syncope. No blood in stool or urine. No palpitations.     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 Abel Hoe and Dr. Sherene Dilling   Type II or unspecified type diabetes mellitus without mention of complication, not stated as uncontrolled    Unspecified essential hypertension      Current Medications: No outpatient medications have been marked as taking for the 11/24/23 encounter (Office Visit) with [facility].      ROS:   Please see the history of present illness.    All other systems reviewed and are negative.  EKGs       Risk Assessment/Calculations:    CHA2DS2-VASc Score =     This indicates a  % annual risk of stroke. The patient's score is based upon:           Physical  Exam:    VS:  BP 126/62   Pulse 70   Ht 6\' 2"  (1.88 m)   Wt 298 lb (135.2 kg)   SpO2 97%   BMI 38.26 kg/m     Wt Readings from Last 3 Encounters:  11/24/23 298 lb (135.2 kg)  06/27/23 295 lb (133.8 kg)  01/01/23 295 lb (133.8 kg)     GEN: Well nourished, well developed in no acute distress, overweight.  NECK: No JVD CARDIAC: RRR, no murmurs, rubs, gallops RESPIRATORY:  Clear to auscultation without rales, wheezing or rhonchi  ABDOMEN: Soft, non-tender, non-distended EXTREMITIES:  Mild pretibial edema bilaterally; No deformity.   ASSESSMENT:    1. S/P TAVR (transcatheter aortic valve replacement)   2. Chronic heart failure with preserved ejection fraction (HFpEF) (HCC)   3. Essential hypertension   4. Paroxysmal atrial fibrillation  (HCC)   5. Stage 3b chronic kidney disease (HCC)   6. Coronary artery disease involving native coronary artery of native heart without angina pectoris     PLAN:    In order of problems listed above:  Severe AS s/p TAVR:  -- Echo today shows EF normal, mild RVE, normally functioning TAVR with a mean gradient of 10 mm hg and no PVL.  -- NYHA class II symptoms.  -- Continue Eliquis  5mg  BID  -- SBE discussed. He has amoxicillin .  -- Continue regular follow up with Dr. Veryl Gottron   Chronic diastolic CHF:  -- Appears mildly overloaded today.  -- Now off Lasix   -- Will check BMET and BNP and add lasix  back if creat reasonable and BNP elevated.    HTN:  -- BP well controlled.  -- Continue Toprol  12.5mg  daily.    Hyperlipidemia:  -- Continue Lipitor  80mg  daily.    Paroxysmal atrial fibrillation:  -- Sounds regular on exam today.  -- Continue Eliquis  5mg  BID.  -- Continue Toprol  12.5mg  daily. .   CKD stage IIIb:  -- Most recent ~2.5 on outside labs in 04/2023 (personally reviewed). -- Followed by nephrology, Dr. Ansel Kingdom.    CAD s/p recent atherectomy 09/2022: -- Previously on triple therapy with Eliquis , ASA and Plavix , which was transitioned to Plavix  and Eliquis  x 6months.  -- Continue Eliquis  5mg  BID alone.  -- Continue atorvastatin  80mg  daily.  -- Stop Plavix  75mg  daily today as it has been out past 6 months from PCI with stable CAD.    Medication Adjustments/Labs and Tests Ordered: Current medicines are reviewed at length with the patient today.  Concerns regarding medicines are outlined above.  Orders Placed This Encounter  Procedures   Comprehensive metabolic panel with GFR   Pro b natriuretic peptide (BNP)   No orders of the defined types were placed in this encounter.   Patient Instructions  Medication Instructions:  Your physician has recommended you make the following change in your medication:  STOP PLAVIX    *If you need a refill on your cardiac  medications before your next appointment, please call your pharmacy*  Lab Work: TODAY: CMET, BNP If you have labs (blood work) drawn today and your tests are completely normal, you will receive your results only by: MyChart Message (if you have MyChart) OR A paper copy in the mail If you have any lab test that is abnormal or we need to change your treatment, we will call you to review the results.  Testing/Procedures: NONE  Follow-Up: At [facility], you and your health needs are our priority.  As part of our continuing mission  to provide you with exceptional heart care, our providers are all part of one team.  This team includes your primary Cardiologist (physician) and Advanced Practice Providers or APPs (Physician Assistants and Nurse Practitioners) who all work together to provide you with the care you need, when you need it.  Your next appointment:   KEEP FOLLOW TIME FRAME WITH DR. Veryl Gottron   We recommend signing up for the patient portal called "MyChart".  Sign up information is provided on this After Visit Summary.  MyChart is used to connect with patients for Virtual Visits (Telemedicine).  Patients are able to view lab/test results, encounter notes, upcoming appointments, etc.  Non-urgent messages can be sent to your provider as well.   To learn more about what you can do with MyChart, go to [email or web address].   Other Instructions       1st Floor: - Lobby - Registration  - Pharmacy  - Lab - Cafe  2nd Floor: - PV Lab - Diagnostic Testing (echo, CT, nuclear med)  3rd Floor: - Vacant  4th Floor: - TCTS (cardiothoracic surgery) - AFib Clinic - Structural Heart Clinic - Vascular Surgery  - Vascular Ultrasound  5th Floor: - HeartCare Cardiology (general and EP) - Clinical Pharmacy for coumadin, hypertension, lipid, weight-loss medications, and med management appointments    Valet parking services will be available as well.       Signed, Abagail Hoar, PA-C  11/24/2023 7:01 PM    [facility]

## 2023-11-24 ENCOUNTER — Ambulatory Visit (INDEPENDENT_AMBULATORY_CARE_PROVIDER_SITE_OTHER): Payer: Medicare Other | Admitting: Physician Assistant

## 2023-11-24 ENCOUNTER — Ambulatory Visit (HOSPITAL_COMMUNITY): Payer: Medicare Other | Attending: Internal Medicine

## 2023-11-24 VITALS — BP 126/62 | HR 70 | Ht 74.0 in | Wt 298.0 lb

## 2023-11-24 DIAGNOSIS — I480 Paroxysmal atrial fibrillation: Secondary | ICD-10-CM | POA: Insufficient documentation

## 2023-11-24 DIAGNOSIS — I10 Essential (primary) hypertension: Secondary | ICD-10-CM | POA: Insufficient documentation

## 2023-11-24 DIAGNOSIS — I2510 Atherosclerotic heart disease of native coronary artery without angina pectoris: Secondary | ICD-10-CM | POA: Insufficient documentation

## 2023-11-24 DIAGNOSIS — Z952 Presence of prosthetic heart valve: Secondary | ICD-10-CM

## 2023-11-24 DIAGNOSIS — I5032 Chronic diastolic (congestive) heart failure: Secondary | ICD-10-CM | POA: Insufficient documentation

## 2023-11-24 DIAGNOSIS — N1832 Chronic kidney disease, stage 3b: Secondary | ICD-10-CM | POA: Insufficient documentation

## 2023-11-24 LAB — ECHOCARDIOGRAM COMPLETE
AR max vel: 2.29 cm2
AV Area VTI: 2.21 cm2
AV Area mean vel: 2.23 cm2
AV Mean grad: 10.5 mmHg
AV Peak grad: 19.1 mmHg
Ao pk vel: 2.19 m/s
Area-P 1/2: 2.1 cm2
MV VTI: 1.92 cm2
S' Lateral: 2.4 cm

## 2023-11-24 NOTE — Patient Instructions (Signed)
 Medication Instructions:  Your physician has recommended you make the following change in your medication:  STOP PLAVIX   *If you need a refill on your cardiac medications before your next appointment, please call your pharmacy*  Lab Work: TODAY: CMET, BNP If you have labs (blood work) drawn today and your tests are completely normal, you will receive your results only by: MyChart Message (if you have MyChart) OR A paper copy in the mail If you have any lab test that is abnormal or we need to change your treatment, we will call you to review the results.  Testing/Procedures: NONE  Follow-Up: At [facility], you and your health needs are our priority.  As part of our continuing mission to provide you with exceptional heart care, our providers are all part of one team.  This team includes your primary Cardiologist (physician) and Advanced Practice Providers or APPs (Physician Assistants and Nurse Practitioners) who all work together to provide you with the care you need, when you need it.  Your next appointment:   KEEP FOLLOW TIME FRAME WITH DR. Cristal Deer   We recommend signing up for the patient portal called "MyChart".  Sign up information is provided on this After Visit Summary.  MyChart is used to connect with patients for Virtual Visits (Telemedicine).  Patients are able to view lab/test results, encounter notes, upcoming appointments, etc.  Non-urgent messages can be sent to your provider as well.   To learn more about what you can do with MyChart, go to [email or web address].   Other Instructions       1st Floor: - Lobby - Registration  - Pharmacy  - Lab - Cafe  2nd Floor: - PV Lab - Diagnostic Testing (echo, CT, nuclear med)  3rd Floor: - Vacant  4th Floor: - TCTS (cardiothoracic surgery) - AFib Clinic - Structural Heart Clinic - Vascular Surgery  - Vascular Ultrasound  5th Floor: - HeartCare Cardiology (general and EP) - Clinical Pharmacy for  coumadin, hypertension, lipid, weight-loss medications, and med management appointments    Valet parking services will be available as well.

## 2023-11-25 DIAGNOSIS — Z952 Presence of prosthetic heart valve: Secondary | ICD-10-CM | POA: Diagnosis not present

## 2023-11-25 DIAGNOSIS — I5032 Chronic diastolic (congestive) heart failure: Secondary | ICD-10-CM | POA: Diagnosis not present

## 2023-11-25 LAB — PRO B NATRIURETIC PEPTIDE: NT-Pro BNP: 790 pg/mL — ABNORMAL HIGH (ref 0–486)

## 2023-11-26 ENCOUNTER — Ambulatory Visit: Payer: Medicare Other

## 2023-11-26 ENCOUNTER — Other Ambulatory Visit (HOSPITAL_COMMUNITY): Payer: Medicare Other

## 2023-12-01 DIAGNOSIS — I10 Essential (primary) hypertension: Secondary | ICD-10-CM | POA: Diagnosis not present

## 2023-12-01 DIAGNOSIS — E039 Hypothyroidism, unspecified: Secondary | ICD-10-CM | POA: Diagnosis not present

## 2023-12-01 DIAGNOSIS — E1129 Type 2 diabetes mellitus with other diabetic kidney complication: Secondary | ICD-10-CM | POA: Diagnosis not present

## 2023-12-01 DIAGNOSIS — E782 Mixed hyperlipidemia: Secondary | ICD-10-CM | POA: Diagnosis not present

## 2023-12-02 DIAGNOSIS — E1129 Type 2 diabetes mellitus with other diabetic kidney complication: Secondary | ICD-10-CM | POA: Diagnosis not present

## 2023-12-02 DIAGNOSIS — E782 Mixed hyperlipidemia: Secondary | ICD-10-CM | POA: Diagnosis not present

## 2023-12-02 DIAGNOSIS — I10 Essential (primary) hypertension: Secondary | ICD-10-CM | POA: Diagnosis not present

## 2023-12-02 DIAGNOSIS — E039 Hypothyroidism, unspecified: Secondary | ICD-10-CM | POA: Diagnosis not present

## 2023-12-05 ENCOUNTER — Other Ambulatory Visit: Payer: Self-pay

## 2023-12-05 DIAGNOSIS — Z952 Presence of prosthetic heart valve: Secondary | ICD-10-CM

## 2023-12-05 DIAGNOSIS — I5032 Chronic diastolic (congestive) heart failure: Secondary | ICD-10-CM

## 2023-12-08 DIAGNOSIS — I5032 Chronic diastolic (congestive) heart failure: Secondary | ICD-10-CM | POA: Diagnosis not present

## 2023-12-08 DIAGNOSIS — Z952 Presence of prosthetic heart valve: Secondary | ICD-10-CM | POA: Diagnosis not present

## 2023-12-09 ENCOUNTER — Other Ambulatory Visit: Payer: Self-pay | Admitting: Physician Assistant

## 2023-12-09 DIAGNOSIS — N1832 Chronic kidney disease, stage 3b: Secondary | ICD-10-CM

## 2023-12-09 DIAGNOSIS — I5032 Chronic diastolic (congestive) heart failure: Secondary | ICD-10-CM

## 2023-12-09 DIAGNOSIS — I10 Essential (primary) hypertension: Secondary | ICD-10-CM | POA: Diagnosis not present

## 2023-12-09 DIAGNOSIS — E1129 Type 2 diabetes mellitus with other diabetic kidney complication: Secondary | ICD-10-CM | POA: Diagnosis not present

## 2023-12-09 DIAGNOSIS — E039 Hypothyroidism, unspecified: Secondary | ICD-10-CM | POA: Diagnosis not present

## 2023-12-09 DIAGNOSIS — E782 Mixed hyperlipidemia: Secondary | ICD-10-CM | POA: Diagnosis not present

## 2023-12-09 DIAGNOSIS — N189 Chronic kidney disease, unspecified: Secondary | ICD-10-CM | POA: Diagnosis not present

## 2023-12-09 LAB — COMPREHENSIVE METABOLIC PANEL WITH GFR
ALT: 12 IU/L (ref 0–44)
AST: 13 IU/L (ref 0–40)
Albumin: 4.1 g/dL (ref 3.8–4.8)
Alkaline Phosphatase: 135 IU/L — ABNORMAL HIGH (ref 44–121)
BUN/Creatinine Ratio: 11 (ref 10–24)
BUN: 26 mg/dL (ref 8–27)
Bilirubin Total: 0.3 mg/dL (ref 0.0–1.2)
CO2: 20 mmol/L (ref 20–29)
Calcium: 9.4 mg/dL (ref 8.6–10.2)
Chloride: 108 mmol/L — ABNORMAL HIGH (ref 96–106)
Creatinine, Ser: 2.33 mg/dL — ABNORMAL HIGH (ref 0.76–1.27)
Globulin, Total: 2.4 g/dL (ref 1.5–4.5)
Glucose: 131 mg/dL — ABNORMAL HIGH (ref 70–99)
Potassium: 4.6 mmol/L (ref 3.5–5.2)
Sodium: 145 mmol/L — ABNORMAL HIGH (ref 134–144)
Total Protein: 6.5 g/dL (ref 6.0–8.5)
eGFR: 28 mL/min/{1.73_m2} — ABNORMAL LOW (ref >59)

## 2023-12-09 MED ORDER — FUROSEMIDE 40 MG PO TABS: 40.0000 mg | ORAL_TABLET | Freq: Every day | ORAL | 6 refills | Status: DC

## 2023-12-22 DIAGNOSIS — I5032 Chronic diastolic (congestive) heart failure: Secondary | ICD-10-CM | POA: Diagnosis not present

## 2023-12-22 DIAGNOSIS — N1832 Chronic kidney disease, stage 3b: Secondary | ICD-10-CM | POA: Diagnosis not present

## 2023-12-23 LAB — BASIC METABOLIC PANEL WITH GFR
BUN/Creatinine Ratio: 19 (ref 10–24)
BUN: 43 mg/dL — ABNORMAL HIGH (ref 8–27)
CO2: 21 mmol/L (ref 20–29)
Calcium: 9 mg/dL (ref 8.6–10.2)
Chloride: 109 mmol/L — ABNORMAL HIGH (ref 96–106)
Creatinine, Ser: 2.26 mg/dL — ABNORMAL HIGH (ref 0.76–1.27)
Glucose: 185 mg/dL — ABNORMAL HIGH (ref 70–99)
Potassium: 3.8 mmol/L (ref 3.5–5.2)
Sodium: 147 mmol/L — ABNORMAL HIGH (ref 134–144)
eGFR: 29 mL/min/{1.73_m2} — ABNORMAL LOW (ref >59)

## 2024-01-02 ENCOUNTER — Ambulatory Visit (HOSPITAL_BASED_OUTPATIENT_CLINIC_OR_DEPARTMENT_OTHER): Admitting: Family

## 2024-01-02 ENCOUNTER — Telehealth: Payer: Self-pay | Admitting: Cardiology

## 2024-01-02 ENCOUNTER — Encounter (HOSPITAL_BASED_OUTPATIENT_CLINIC_OR_DEPARTMENT_OTHER): Payer: Self-pay | Admitting: Family

## 2024-01-02 VITALS — BP 90/48 | HR 72 | Ht 74.0 in | Wt 293.0 lb

## 2024-01-02 DIAGNOSIS — R0609 Other forms of dyspnea: Secondary | ICD-10-CM

## 2024-01-02 DIAGNOSIS — R600 Localized edema: Secondary | ICD-10-CM

## 2024-01-02 DIAGNOSIS — Z952 Presence of prosthetic heart valve: Secondary | ICD-10-CM

## 2024-01-02 DIAGNOSIS — I25119 Atherosclerotic heart disease of native coronary artery with unspecified angina pectoris: Secondary | ICD-10-CM | POA: Diagnosis not present

## 2024-01-02 DIAGNOSIS — I10 Essential (primary) hypertension: Secondary | ICD-10-CM | POA: Diagnosis not present

## 2024-01-02 DIAGNOSIS — I4819 Other persistent atrial fibrillation: Secondary | ICD-10-CM | POA: Diagnosis not present

## 2024-01-02 MED ORDER — LEVALBUTEROL TARTRATE 45 MCG/ACT IN AERO: 2.0000 | INHALATION_SPRAY | RESPIRATORY_TRACT | 12 refills | Status: DC | PRN

## 2024-01-02 NOTE — Telephone Encounter (Signed)
 Pt is calling Marci Setter Or Reuben Castilla to let them know he can do his cardioversion ob 5/28

## 2024-01-02 NOTE — Progress Notes (Signed)
 Cardiology Office Note:  .   Date:  01/03/2024  ID:  Brandon Sanchez, DOB [birth year], MRN [id number] PCP: Imelda Man, MD  Nephrology: Dr. Conan [facility] Providers Cardiologist:  Sheryle Donning, MD Electrophysiologist:  Will Cortland Ding, MD  Structural Heart:  Antoinette Batman, MD   History of Present Illness: .   Brandon Sanchez is a 78 y.o. male with hx of  chronic diastolic CHF, asthma, DMT2, HTN, HLD, paroxysmal atrial fibrillation s/p ablation (2021), prior CVA, nephrolithiasis, CKD stage IIIb, CAD s/p PCI LAD 10/04/22, and severe aortic stenosis s/p TAVR 11/26/22.  Last seen 11/24/23 by Brandon Moors, PA with LE edema, exertional dyspnea. Lasix  resumed.   Presents today independently for follow up. Resports his LE edema is better in the morning and progresses throughout the day. Does note some improvement with adding back Lasix . No color change. Presently taking Furosemide  40mg  daily in the morning with some improvement.  He reports feeling more short of breath with the "least exertion" for example such as walking across the hall. That is a more recent change per his report about a month ago which has been about the same. Some shortness of breath when getting reading to lay down at night with  no PND. He does note some generalized weakness described as wearing out more quickly than he would anticipate. He does note wheezingw hich is more bothersome at night ongoing for at least a month, maybe more. Wheezing is audible. Feels he has to clear his throat more but no congested cough. He has a BP cuff at home but has not been checking recently. Appetite has been good. Drinks diet caffeine free soda with meals and water throughout the day with approx 50 oz fluid intake per day. No chest pain, pressure, tightness. Reports no lightheadedness, dizziness, palpitations.    ROS: Please see the history of present illness.    All other systems reviewed and are negative.    Studies Reviewed: [name]   EKG Interpretation Date/Time:  Friday Jan 02 2024 10:14:31 EDT Ventricular Rate:  69 PR Interval:    QRS Duration:  110 QT Interval:  398 QTC Calculation: 426 R Axis:   -59  Text Interpretation: Atrial fibrillation Incomplete right bundle branch block Left anterior fascicular block No acute ST/T wave changes. Confirmed by Neomi Banks ([id number]) on 01/02/2024 10:17:21 AM    Cardiac Studies & Procedures   ______________________________________________________________________________________________ CARDIAC CATHETERIZATION  CARDIAC CATHETERIZATION 10/04/2022  Conclusion   Mid LAD lesion is 80% stenosed.   A drug-eluting stent was successfully placed using a SYNERGY XD 2.25X24.   Post intervention, there is a 0% residual stenosis.  Severe, heavily calcified stenosis mid LAD Successful orbital atherectomy with PTCA/DES x 1 mid LAD  Recommendations: Will admit to telemetry post PCI for hydration given CKD. BMET in am. I will plan to see him back in the office post cath and if his renal function is stable, we will then plan his pre-TAVR CT scans. Likely d/c home tomorrow. Continue ASA/Plavix . Resume Eliquis  tomorrow. I would continue triple therapy for now as we will be holding his Eliquis  around the time of his TAVR and I would like for him to be on DAPT during that period of time.  Findings Coronary Findings Diagnostic  Dominance: Co-dominant  Left Anterior Descending Vessel is large. Mid LAD lesion is 80% stenosed.  First Diagonal Branch Vessel is moderate in size.  Second Diagonal Branch Vessel is small in size.  Left Circumflex Vessel is large.  Intervention  Mid LAD lesion Stent CATH VISTA GUIDE 6FR JL3.5 guide catheter was inserted. Lesion crossed with guidewire using a WIRE COUGAR XT STRL 190CM. Pre-stent angioplasty was performed using a BALLN SAPPHIRE 2.0X15. A drug-eluting stent was successfully placed using a SYNERGY XD 2.25X24. Stent  strut is well apposed. Post-stent angioplasty was performed using a BALL SAPPHIRE NC24 3.0X8. Orbital atherectomy of mid LAD lesion at low speed with 5 passes. Post-Intervention Lesion Assessment The intervention was successful. Pre-interventional TIMI flow is 3. Post-intervention TIMI flow is 3. No complications occurred at this lesion. There is a 0% residual stenosis post intervention.   CARDIAC CATHETERIZATION  CARDIAC CATHETERIZATION 08/29/2022  Conclusion   Prox RCA to Mid RCA lesion is 30% stenosed.   Mid LAD lesion is 80% stenosed.  The LAD and Circumflex arise from different ostia. Severe mid LAD stenosis The Circumflex is a large co-dominant vessel with no obvious disease based on non-selective angiography.  (Unable to selectively engage the Circumflex despite the use of multiple catheters). The RCA is a moderate caliber co-dominant vessel with mild mid vessel stenosis. Severe aortic stenosis (mean gradient 45.5 mmHg, peak to peak gradient 62.8 mmHg, AVA 0.65 cm2).  Recommendations: Will plan to stage PCI of the LAD. I will see him back in the office 1-2 weeks from now to recheck his renal function and review the PCI in detail. Will continue workup for TAVR after his PCI of the LAD. I will load his anti-platelet agent when he is seen in the office.  Findings Coronary Findings Diagnostic  Dominance: Co-dominant  Left Anterior Descending Vessel is large. Mid LAD lesion is 80% stenosed.  First Diagonal Branch Vessel is moderate in size.  Second Diagonal Branch Vessel is small in size.  Left Circumflex Vessel is large.  Right Coronary Artery Prox RCA to Mid RCA lesion is 30% stenosed.  Intervention  No interventions have been documented.     ECHOCARDIOGRAM  ECHOCARDIOGRAM COMPLETE 11/24/2023  Narrative ECHOCARDIOGRAM REPORT    Patient Name:   Brandon Sanchez Date of Exam: 11/24/2023 Medical Rec #:  [id number]        Height:       74.0 in Accession #:     [id number]       Weight:       295.0 lb Date of Birth:  Aug 26, 1945        BSA:          2.564 m Patient Age:    77 years         BP:           127/78 mmHg Patient Gender: M                HR:           67 bpm. Exam Location:  [address]  Procedure: 2D Echo, 3D Echo, Cardiac Doppler and Color Doppler (Both Spectral and Color Flow Doppler were utilized during procedure).  Indications:    Z95.2 Status Post TAVR  History:        Patient has prior history of Echocardiogram examinations, most recent 01/01/2023. CAD, Aortic Valve Disease, Arrythmias:Atrial Fibrillation, Signs/Symptoms:Shortness of Breath; Risk Factors:Hypertension, Diabetes, Dyslipidemia and Family History of Coronary Artery Disease. Aortic Stenosis, (11/26/22, 29mm Edwards S3UR).  Sonographer:    Ewing Holiday RDCS Referring Phys: JILL D MCDANIEL  IMPRESSIONS   1. Left ventricular diastolic parameters were normal. 2. Right ventricular systolic function is normal. The right ventricular size is mildly enlarged. 3. Left  atrial size was mildly dilated. 4. Right atrial size was mildly dilated. 5. Trivial mitral valve regurgitation. 6. S/p TAVR (29 mm Edwards S3UR , procedure date 11/26/22) Peak and mean gradients through the valve are 18 and 10 mm Hg respectively. AVA (VTI) is 2.2 cm2. DVI is 0.45 COmpared to echo report from April 2024, no significant change . The aortic valve has been repaired/replaced. Aortic valve regurgitation is not visualized.  FINDINGS Left Ventricle: The left ventricular internal cavity size was normal in size. There is no left ventricular hypertrophy. Left ventricular diastolic parameters were normal.  Right Ventricle: The right ventricular size is mildly enlarged. Right vetricular wall thickness was not assessed. Right ventricular systolic function is normal.  Left Atrium: Left atrial size was mildly dilated.  Right Atrium: Right atrial size was mildly dilated.  Pericardium: There is no  evidence of pericardial effusion.  Mitral Valve: There is mild thickening of the mitral valve leaflet(s). Mild to moderate mitral annular calcification. Trivial mitral valve regurgitation. MV peak gradient, 10.4 mmHg. The mean mitral valve gradient is 3.0 mmHg.  Tricuspid Valve: The tricuspid valve is normal in structure. Tricuspid valve regurgitation is trivial.  Aortic Valve: S/p TAVR (29 mm Edwards S3UR , procedure date 11/26/22) Peak and mean gradients through the valve are 18 and 10 mm Hg respectively. AVA (VTI) is 2.2 cm2. DVI is 0.45 COmpared to echo report from April 2024, no significant change. The aortic valve has been repaired/replaced. Aortic valve regurgitation is not visualized. Aortic valve mean gradient measures 10.5 mmHg. Aortic valve peak gradient measures 19.1 mmHg. Aortic valve area, by VTI measures 2.21 cm.  Pulmonic Valve: The pulmonic valve was normal in structure. Pulmonic valve regurgitation is not visualized.  Aorta: The aortic root and ascending aorta are structurally normal, with no evidence of dilitation.  IAS/Shunts: No atrial level shunt detected by color flow Doppler.   LEFT VENTRICLE PLAX 2D LVIDd:         4.70 cm   Diastology LVIDs:         2.40 cm   LV e' medial:    7.94 cm/s LV PW:         1.10 cm   LV E/e' medial:  18.8 LV IVS:        1.10 cm   LV e' lateral:   12.30 cm/s LVOT diam:     2.50 cm   LV E/e' lateral: 12.1 LV SV:         108 LV SV Index:   42 LVOT Area:     4.91 cm  3D Volume EF: 3D EF:        48 % LV EDV:       103 ml LV ESV:       53 ml LV SV:        50 ml  RIGHT VENTRICLE             IVC RV Basal diam:  4.40 cm     IVC diam: 1.40 cm RV Mid diam:    4.10 cm RV S prime:     14.90 cm/s TAPSE (M-mode): 2.1 cm RVSP:           31.1 mmHg  LEFT ATRIUM             Index        RIGHT ATRIUM           Index LA diam:        4.70 cm 1.83 cm/m  RA Pressure: 3.00 mmHg LA Vol (A2C):   95.1 ml 37.09 ml/m  RA Area:     27.20 cm LA Vol  (A4C):   88.2 ml 34.40 ml/m  RA Volume:   92.10 ml  35.92 ml/m LA Biplane Vol: 99.1 ml 38.65 ml/m AORTIC VALVE AV Area (Vmax):    2.29 cm AV Area (Vmean):   2.23 cm AV Area (VTI):     2.21 cm AV Vmax:           218.50 cm/s AV Vmean:          151.000 cm/s AV VTI:            0.490 m AV Peak Grad:      19.1 mmHg AV Mean Grad:      10.5 mmHg LVOT Vmax:         101.75 cm/s LVOT Vmean:        68.650 cm/s LVOT VTI:          0.221 m LVOT/AV VTI ratio: 0.45  AORTA Ao Root diam: 3.40 cm Ao Asc diam:  3.90 cm  MITRAL VALVE                TRICUSPID VALVE MV Area (PHT): cm          TR Peak grad:   28.1 mmHg MV Area VTI:   1.92 cm     TR Vmax:        265.00 cm/s MV Peak grad:  10.4 mmHg    Estimated RAP:  3.00 mmHg MV Mean grad:  3.0 mmHg     RVSP:           31.1 mmHg MV Vmax:       1.61 m/s MV Vmean:      77.2 cm/s    SHUNTS MV Decel Time: 361 msec     Systemic VTI:  0.22 m MV E velocity: 149.00 cm/s  Systemic Diam: 2.50 cm MV A velocity: 92.55 cm/s MV E/A ratio:  1.61  Ola Berger MD Electronically signed by Ola Berger MD Signature Date/Time: 11/24/2023/2:45:01 PM    Final   TEE  ECHO TEE 09/22/2019  Narrative TRANSESOPHOGEAL ECHO REPORT    Patient Name:   ALVEN ALVERIO Date of Exam: 09/22/2019 Medical Rec #:  [id number]        Height:       75.0 in Accession #:    [id number]       Weight:       300.0 lb Date of Birth:  1946/06/07        BSA:          2.61 m Patient Age:    73 years         BP:           98/73 mmHg Patient Gender: M                HR:           132 bpm. Exam Location:  Inpatient   Procedure: Transesophageal Echo, Color Doppler and Cardiac Doppler  Indications:     atrial fibrillation  History:         Patient has prior history of Echocardiogram examinations, most recent 09/20/2019.  Sonographer:     Dione Franks Referring Phys:  [id number] CADENCE H FURTH Diagnosing Phys: Carson Clara MD    PROCEDURE: The TEE was followed by  Cardioversion. Patients was monitored while under deep sedation. The transesophogeal probe was passed  through the esophogus of the patient. The patient developed no complications during the procedure.  IMPRESSIONS   1. No left atrial appendage thrombus. 2. Left ventricular ejection fraction, by visual estimation, is 50 to 55%. The left ventricle has low normal function. There is mildly increased left ventricular hypertrophy. 3. Global right ventricle has normal systolic function.The right ventricular size is normal. 4. The mitral valve is normal in structure. Mild mitral valve regurgitation. 5. The tricuspid valve is normal in structure. Tricuspid valve regurgitation is trivial. 6. The aortic valve is abnormal. Moderately calcified. Aortic valve regurgitation is mild. 7. The pulmonic valve was grossly normal. Pulmonic valve regurgitation is not visualized. 8. 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 9. Evidence of atrial level shunting detected by color flow Doppler.  FINDINGS Left Ventricle: Left ventricular ejection fraction, by visual estimation, is 50 to 55%. The left ventricle has low normal function. The left ventricle has no regional wall motion abnormalities. There is mildly increased left ventricular hypertrophy.  Right Ventricle: The right ventricular size is normal. No increase in right ventricular wall thickness. Global RV systolic function is has normal systolic function.  Left Atrium: Left atrial size was mildly dilated. No left atrial appendage thrombus.  Right Atrium: Right atrial size was mildly dilated  Pericardium: There is no evidence of pericardial effusion.  Mitral Valve: The mitral valve is normal in structure. Mild mitral valve regurgitation.  Tricuspid Valve: The tricuspid valve is normal in structure. Tricuspid valve regurgitation is trivial.  Aortic Valve: The aortic valve is abnormal. Aortic valve regurgitation is mild. There is  moderate calcification of the aortic valve.  Pulmonic Valve: The pulmonic valve was grossly normal. Pulmonic valve regurgitation is not visualized.  Aorta: Aortic dilatation noted. 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  Shunts: Evidence of atrial level shunting detected by color flow Doppler.   Carson Clara MD Electronically signed by Carson Clara MD Signature Date/Time: 09/22/2019/5:35:37 PM    Final  MONITORS  LONG TERM MONITOR-LIVE TELEMETRY (3-14 DAYS) 12/19/2022  Narrative Patch Wear Time:  13 days and 13 hours (2024-04-10T09:38:13-0400 to 2024-04-23T23:30:59-0400)  Patient had a min HR of 52 bpm, max HR of 169 bpm, and avg HR of 75 bpm. Predominant underlying rhythm was Sinus Rhythm. First Degree AV Block was present. Slight P wave morphology changes were noted. 37 Supraventricular Tachycardia runs occurred, the run with the fastest interval lasting 7 beats with a max rate of 169 bpm, the longest lasting 20 beats with an avg rate of 101 bpm. No VT, atrial fibrillation, high degree block, or pauses noted. Isolated atrial and ventricular ectopy was rare (<1%). There was 1 triggered event, which was sinus with ectopy. No high risk arrhythmias detected.   CT SCANS  CT CORONARY MORPH W/CTA COR W/SCORE 10/21/2022  Addendum 10/21/2022  7:57 PM ADDENDUM REPORT: 10/21/2022 19:54  CLINICAL DATA:  Severe Aortic Stenosis.  EXAM: Cardiac TAVR CT  TECHNIQUE: A non-contrast, gated CT scan was obtained with axial slices of 3 mm through the heart for aortic valve calcium  scoring. A 120 kV retrospective, gated, contrast cardiac scan was obtained. Gantry rotation speed was 250 msecs and collimation was 0.6 mm. Nitroglycerin  was not given. The 3D data set was reconstructed in 5% intervals of the 0-95% of the R-R cycle. Systolic and diastolic phases were analyzed on a dedicated workstation using MPR, MIP, and VRT modes. The patient received 100 cc of  contrast.  FINDINGS: Image quality:  Excellent.  Noise artifact is: Limited.  Valve Morphology: Tricuspid aortic valve. Severely calcified leaflets. Bulky calcification of the LCC. Restricted movement in systole.  Aortic Valve Calcium  score: 3101  Aortic annular dimension:  Phase assessed: 10%  Annular area: 563 mm2  Annular perimeter: 85.4 mm  Max diameter: 28.3 mm  Min diameter: 25.7 mm  Annular and subannular calcification: Mild annular calcium  under the LCC.  Membranous septum length: 10.2 mm  Optimal coplanar projection: RAO 3 CRA 2  Coronary Artery Height above Annulus:  Left Main: 16.6 mm  Right Coronary: 18.1 mm  Sinus of Valsalva Measurements:  Non-coronary: 32.5 mm  Right-coronary: 31 mm  Left-coronary: 35.7 mm  Sinus of Valsalva Height:  Non-coronary: 23.7 mm  Right-coronary: 23.9 mm  Left-coronary: 24.8 mm  Sinotubular Junction: 30 mm  Ascending Thoracic Aorta: 37 mm  Coronary Arteries: Normal coronary origin. Co-dominance. The study was performed without use of NTG and is insufficient for plaque evaluation. Please refer to recent cardiac catheterization for coronary assessment. LAD stent noted. 3-vessel coronary calcifications.  Cardiac Morphology:  Right Atrium: Right atrial size is within normal limits.  Right Ventricle: The right ventricular cavity is within normal limits.  Left Atrium: Left atrial size is normal in size with no left atrial appendage filling defect.  Left Ventricle: The ventricular cavity size is within normal limits.  Pulmonary arteries: Dilated pulmonary artery suggestive of pulmonary hypertension.  Pulmonary veins: Normal pulmonary venous drainage.  Pericardium: Normal thickness with no significant effusion or calcium  present.  Mitral Valve: The mitral valve is normal structure without significant calcification.  Extra-cardiac findings: See attached radiology report for non-cardiac  structures.  IMPRESSION: 1. Tricuspid aortic valve. Severe aortic stenosis. Annular measurements favor a 29 mm S3 (563 mm2).  2. Mild annular calcium  under the LCC.  3. Sufficient coronary to annulus distance.  4. Optimal Fluoroscopic Angle for Delivery: RAO 3 CRA 2  5. Dilated pulmonary artery suggestive of pulmonary hypertension.  Melodee Spruce T. Rolm Clos, MD   Electronically Signed By: Jackquelyn Mass M.D. On: 10/21/2022 19:54  Narrative EXAM: OVER-READ INTERPRETATION  CT CHEST  The following report is a limited chest CT over-read performed by radiologist Dr. Avelino Lek of [facility] on 10/21/2022. This over-read does not include interpretation of cardiac or coronary anatomy or pathology. The cardiac TAVR interpretation by the cardiologist is attached.  COMPARISON:  None Available.  FINDINGS: Extracardiac findings will be described separately under dictation for contemporaneously obtained CTA chest, abdomen and pelvis.  IMPRESSION: Please see separate dictation for contemporaneously obtained CTA chest, abdomen and pelvis dated 11/21/2022 for full description of relevant extracardiac findings.  Electronically Signed: By: Avelino Lek M.D. On: 10/21/2022 14:49     ______________________________________________________________________________________________      Risk Assessment/Calculations:    CHA2DS2-VASc Score = 6   This indicates a 9.7% annual risk of stroke. The patient's score is based upon: CHF History: 0 HTN History: 1 Diabetes History: 1 Stroke History: 2 Vascular Disease History: 0 Age Score: 2 Gender Score: 0            Physical Exam:   VS:  BP (!) 90/48 (BP Location: Left Arm, Patient Position: Sitting, Cuff Size: Large)   Pulse 72   Ht 6\' 2"  (1.88 m)   Wt 293 lb (132.9 kg)   BMI 37.62 kg/m    Wt Readings from Last 3 Encounters:  01/02/24 293 lb (132.9 kg)  11/24/23 298 lb (135.2 kg)  06/27/23 295 lb (133.8 kg)  GEN: Well nourished, well developed in no acute distress NECK: No JVD; No carotid bruits CARDIAC: IRIR, no murmurs, rubs, gallops RESPIRATORY:  Diffuse wheezing throughout all lung fields ABDOMEN: Soft, non-tender, non-distended EXTREMITIES:  No edema; No deformity   ASSESSMENT AND PLAN: .    Diffuse wheezing on exam - CBC today to assess for elevated wbc count. Rx Levalbuterol  2puff q4h PRN. If wheeze does not improve, consider PFT and/or referral to pulmonology.  PAF / Hypercoagulable state - recurrent atrial fib likely contributory to dyspnea. Plan for cardioversion. Reports no missed doses Eliquis . CHA2DS2-VASc Score = 6 [CHF History: 0, HTN History: 1, Diabetes History: 1, Stroke History: 2, Vascular Disease History: 0, Age Score: 2, Gender Score: 0].  Therefore, the patient's annual risk of stroke is 9.7 %.    Denies bleeding complications. As rate controlled will continue Toprol  12.5mg  daily.  HFpEF - recent echo normal LVEF. Update proBNP, CBC, BMET. Diastolic dysfunction and fluid retention likely exacerbated by atrial fib. Continue Lasix  40mg  daily with careful monitoring of renal function due to CKD.  S/p TAVR - recent echo with appropriate TAVR function.    Informed Consent   Shared Decision Making/Informed Consent. The risks (stroke, cardiac arrhythmias rarely resulting in the need for a temporary or permanent pacemaker, skin irritation or burns and complications associated with conscious sedation including aspiration, arrhythmia, respiratory failure and death), benefits (restoration of normal sinus rhythm) and alternatives of a direct current cardioversion were explained in detail to Mr. Lauralee Poll and he agrees to proceed.       Dispo: follow up 2-3 months after cardioversion  Signed, Clearnce Curia, NP

## 2024-01-02 NOTE — Patient Instructions (Addendum)
 Medication Instructions:  START Levalbuterol  2 puffs every 4 hours as needed for wheezing, shortness of breath  CONTINUE Lasix  40mg  daily  *If you need a refill on your cardiac medications before your next appointment, please call your pharmacy*  Lab Work: Your physician recommends that you return for lab work today: ProBNP, CBC, BMET  Testing/Procedures: Your EKG today showed recurrent atrial fibrillation  Your next appointment:   TBD   Other Instructions     Please start checking blood pressure once per day and keep a log  Potential Cardioversion days with Dr. Veryl Gottron-  5/28 or 6/13

## 2024-01-02 NOTE — Telephone Encounter (Signed)
 Called pt and reviewed instructions-pt verbalizes understanding.

## 2024-01-02 NOTE — H&P (View-Only) (Signed)
 Cardiology Office Note:  .   Date:  01/03/2024  ID:  RAAD CLAYSON, DOB [birth year], MRN [id number] PCP: Imelda Man, MD  Nephrology: Dr. Conan [facility] Providers Cardiologist:  Sheryle Donning, MD Electrophysiologist:  Will Cortland Ding, MD  Structural Heart:  Antoinette Batman, MD   History of Present Illness: .   Brandon Sanchez is a 78 y.o. male with hx of  chronic diastolic CHF, asthma, DMT2, HTN, HLD, paroxysmal atrial fibrillation s/p ablation (2021), prior CVA, nephrolithiasis, CKD stage IIIb, CAD s/p PCI LAD 10/04/22, and severe aortic stenosis s/p TAVR 11/26/22.  Last seen 11/24/23 by [name], PA with LE edema, exertional dyspnea. Lasix  resumed.   Presents today independently for follow up. Resports his LE edema is better in the morning and progresses throughout the day. Does note some improvement with adding back Lasix . No color change. Presently taking Furosemide  40mg  daily in the morning with some improvement.  He reports feeling more short of breath with the "least exertion" for example such as walking across the hall. That is a more recent change per his report about a month ago which has been about the same. Some shortness of breath when getting reading to lay down at night with  no PND. He does note some generalized weakness described as wearing out more quickly than he would anticipate. He does note wheezingw hich is more bothersome at night ongoing for at least a month, maybe more. Wheezing is audible. Feels he has to clear his throat more but no congested cough. He has a BP cuff at home but has not been checking recently. Appetite has been good. Drinks diet caffeine free soda with meals and water throughout the day with approx 50 oz fluid intake per day. No chest pain, pressure, tightness. Reports no lightheadedness, dizziness, palpitations.    ROS: Please see the history of present illness.    All other systems reviewed and are negative.    Studies Reviewed: [name]   EKG Interpretation Date/Time:  Friday Jan 02 2024 10:14:31 EDT Ventricular Rate:  69 PR Interval:    QRS Duration:  110 QT Interval:  398 QTC Calculation: 426 R Axis:   -59  Text Interpretation: Atrial fibrillation Incomplete right bundle branch block Left anterior fascicular block No acute ST/T wave changes. Confirmed by Neomi Banks ([id number]) on 01/02/2024 10:17:21 AM    Cardiac Studies & Procedures   ______________________________________________________________________________________________ CARDIAC CATHETERIZATION  CARDIAC CATHETERIZATION 10/04/2022  Conclusion   Mid LAD lesion is 80% stenosed.   A drug-eluting stent was successfully placed using a SYNERGY XD 2.25X24.   Post intervention, there is a 0% residual stenosis.  Severe, heavily calcified stenosis mid LAD Successful orbital atherectomy with PTCA/DES x 1 mid LAD  Recommendations: Will admit to telemetry post PCI for hydration given CKD. BMET in am. I will plan to see him back in the office post cath and if his renal function is stable, we will then plan his pre-TAVR CT scans. Likely d/c home tomorrow. Continue ASA/Plavix . Resume Eliquis  tomorrow. I would continue triple therapy for now as we will be holding his Eliquis  around the time of his TAVR and I would like for him to be on DAPT during that period of time.  Findings Coronary Findings Diagnostic  Dominance: Co-dominant  Left Anterior Descending Vessel is large. Mid LAD lesion is 80% stenosed.  First Diagonal Branch Vessel is moderate in size.  Second Diagonal Branch Vessel is small in size.  Left Circumflex Vessel is large.  Intervention  Mid LAD lesion Stent CATH VISTA GUIDE 6FR JL3.5 guide catheter was inserted. Lesion crossed with guidewire using a WIRE COUGAR XT STRL 190CM. Pre-stent angioplasty was performed using a BALLN SAPPHIRE 2.0X15. A drug-eluting stent was successfully placed using a SYNERGY XD 2.25X24. Stent  strut is well apposed. Post-stent angioplasty was performed using a BALL SAPPHIRE NC24 3.0X8. Orbital atherectomy of mid LAD lesion at low speed with 5 passes. Post-Intervention Lesion Assessment The intervention was successful. Pre-interventional TIMI flow is 3. Post-intervention TIMI flow is 3. No complications occurred at this lesion. There is a 0% residual stenosis post intervention.   CARDIAC CATHETERIZATION  CARDIAC CATHETERIZATION 08/29/2022  Conclusion   Prox RCA to Mid RCA lesion is 30% stenosed.   Mid LAD lesion is 80% stenosed.  The LAD and Circumflex arise from different ostia. Severe mid LAD stenosis The Circumflex is a large co-dominant vessel with no obvious disease based on non-selective angiography.  (Unable to selectively engage the Circumflex despite the use of multiple catheters). The RCA is a moderate caliber co-dominant vessel with mild mid vessel stenosis. Severe aortic stenosis (mean gradient 45.5 mmHg, peak to peak gradient 62.8 mmHg, AVA 0.65 cm2).  Recommendations: Will plan to stage PCI of the LAD. I will see him back in the office 1-2 weeks from now to recheck his renal function and review the PCI in detail. Will continue workup for TAVR after his PCI of the LAD. I will load his anti-platelet agent when he is seen in the office.  Findings Coronary Findings Diagnostic  Dominance: Co-dominant  Left Anterior Descending Vessel is large. Mid LAD lesion is 80% stenosed.  First Diagonal Branch Vessel is moderate in size.  Second Diagonal Branch Vessel is small in size.  Left Circumflex Vessel is large.  Right Coronary Artery Prox RCA to Mid RCA lesion is 30% stenosed.  Intervention  No interventions have been documented.     ECHOCARDIOGRAM  ECHOCARDIOGRAM COMPLETE 11/24/2023  Narrative ECHOCARDIOGRAM REPORT    Patient Name:   Brandon Sanchez Date of Exam: 11/24/2023 Medical Rec #:  [id number]        Height:       74.0 in Accession #:     [id number]       Weight:       295.0 lb Date of Birth:  Aug 26, 1945        BSA:          2.564 m Patient Age:    77 years         BP:           127/78 mmHg Patient Gender: M                HR:           67 bpm. Exam Location:  [address]  Procedure: 2D Echo, 3D Echo, Cardiac Doppler and Color Doppler (Both Spectral and Color Flow Doppler were utilized during procedure).  Indications:    Z95.2 Status Post TAVR  History:        Patient has prior history of Echocardiogram examinations, most recent 01/01/2023. CAD, Aortic Valve Disease, Arrythmias:Atrial Fibrillation, Signs/Symptoms:Shortness of Breath; Risk Factors:Hypertension, Diabetes, Dyslipidemia and Family History of Coronary Artery Disease. Aortic Stenosis, (11/26/22, 29mm Edwards S3UR).  Sonographer:    Ewing Holiday RDCS Referring Phys: JILL D MCDANIEL  IMPRESSIONS   1. Left ventricular diastolic parameters were normal. 2. Right ventricular systolic function is normal. The right ventricular size is mildly enlarged. 3. Left  atrial size was mildly dilated. 4. Right atrial size was mildly dilated. 5. Trivial mitral valve regurgitation. 6. S/p TAVR (29 mm Edwards S3UR , procedure date 11/26/22) Peak and mean gradients through the valve are 18 and 10 mm Hg respectively. AVA (VTI) is 2.2 cm2. DVI is 0.45 COmpared to echo report from April 2024, no significant change . The aortic valve has been repaired/replaced. Aortic valve regurgitation is not visualized.  FINDINGS Left Ventricle: The left ventricular internal cavity size was normal in size. There is no left ventricular hypertrophy. Left ventricular diastolic parameters were normal.  Right Ventricle: The right ventricular size is mildly enlarged. Right vetricular wall thickness was not assessed. Right ventricular systolic function is normal.  Left Atrium: Left atrial size was mildly dilated.  Right Atrium: Right atrial size was mildly dilated.  Pericardium: There is no  evidence of pericardial effusion.  Mitral Valve: There is mild thickening of the mitral valve leaflet(s). Mild to moderate mitral annular calcification. Trivial mitral valve regurgitation. MV peak gradient, 10.4 mmHg. The mean mitral valve gradient is 3.0 mmHg.  Tricuspid Valve: The tricuspid valve is normal in structure. Tricuspid valve regurgitation is trivial.  Aortic Valve: S/p TAVR (29 mm Edwards S3UR , procedure date 11/26/22) Peak and mean gradients through the valve are 18 and 10 mm Hg respectively. AVA (VTI) is 2.2 cm2. DVI is 0.45 COmpared to echo report from April 2024, no significant change. The aortic valve has been repaired/replaced. Aortic valve regurgitation is not visualized. Aortic valve mean gradient measures 10.5 mmHg. Aortic valve peak gradient measures 19.1 mmHg. Aortic valve area, by VTI measures 2.21 cm.  Pulmonic Valve: The pulmonic valve was normal in structure. Pulmonic valve regurgitation is not visualized.  Aorta: The aortic root and ascending aorta are structurally normal, with no evidence of dilitation.  IAS/Shunts: No atrial level shunt detected by color flow Doppler.   LEFT VENTRICLE PLAX 2D LVIDd:         4.70 cm   Diastology LVIDs:         2.40 cm   LV e' medial:    7.94 cm/s LV PW:         1.10 cm   LV E/e' medial:  18.8 LV IVS:        1.10 cm   LV e' lateral:   12.30 cm/s LVOT diam:     2.50 cm   LV E/e' lateral: 12.1 LV SV:         108 LV SV Index:   42 LVOT Area:     4.91 cm  3D Volume EF: 3D EF:        48 % LV EDV:       103 ml LV ESV:       53 ml LV SV:        50 ml  RIGHT VENTRICLE             IVC RV Basal diam:  4.40 cm     IVC diam: 1.40 cm RV Mid diam:    4.10 cm RV S prime:     14.90 cm/s TAPSE (M-mode): 2.1 cm RVSP:           31.1 mmHg  LEFT ATRIUM             Index        RIGHT ATRIUM           Index LA diam:        4.70 cm 1.83 cm/m  RA Pressure: 3.00 mmHg LA Vol (A2C):   95.1 ml 37.09 ml/m  RA Area:     27.20 cm LA Vol  (A4C):   88.2 ml 34.40 ml/m  RA Volume:   92.10 ml  35.92 ml/m LA Biplane Vol: 99.1 ml 38.65 ml/m AORTIC VALVE AV Area (Vmax):    2.29 cm AV Area (Vmean):   2.23 cm AV Area (VTI):     2.21 cm AV Vmax:           218.50 cm/s AV Vmean:          151.000 cm/s AV VTI:            0.490 m AV Peak Grad:      19.1 mmHg AV Mean Grad:      10.5 mmHg LVOT Vmax:         101.75 cm/s LVOT Vmean:        68.650 cm/s LVOT VTI:          0.221 m LVOT/AV VTI ratio: 0.45  AORTA Ao Root diam: 3.40 cm Ao Asc diam:  3.90 cm  MITRAL VALVE                TRICUSPID VALVE MV Area (PHT): cm          TR Peak grad:   28.1 mmHg MV Area VTI:   1.92 cm     TR Vmax:        265.00 cm/s MV Peak grad:  10.4 mmHg    Estimated RAP:  3.00 mmHg MV Mean grad:  3.0 mmHg     RVSP:           31.1 mmHg MV Vmax:       1.61 m/s MV Vmean:      77.2 cm/s    SHUNTS MV Decel Time: 361 msec     Systemic VTI:  0.22 m MV E velocity: 149.00 cm/s  Systemic Diam: 2.50 cm MV A velocity: 92.55 cm/s MV E/A ratio:  1.61  Ola Berger MD Electronically signed by Ola Berger MD Signature Date/Time: 11/24/2023/2:45:01 PM    Final   TEE  ECHO TEE 09/22/2019  Narrative TRANSESOPHOGEAL ECHO REPORT    Patient Name:   ALVEN ALVERIO Date of Exam: 09/22/2019 Medical Rec #:  [id number]        Height:       75.0 in Accession #:    [id number]       Weight:       300.0 lb Date of Birth:  1946/06/07        BSA:          2.61 m Patient Age:    73 years         BP:           98/73 mmHg Patient Gender: M                HR:           132 bpm. Exam Location:  Inpatient   Procedure: Transesophageal Echo, Color Doppler and Cardiac Doppler  Indications:     atrial fibrillation  History:         Patient has prior history of Echocardiogram examinations, most recent 09/20/2019.  Sonographer:     Dione Franks Referring Phys:  [id number] CADENCE H FURTH Diagnosing Phys: Carson Clara MD    PROCEDURE: The TEE was followed by  Cardioversion. Patients was monitored while under deep sedation. The transesophogeal probe was passed  through the esophogus of the patient. The patient developed no complications during the procedure.  IMPRESSIONS   1. No left atrial appendage thrombus. 2. Left ventricular ejection fraction, by visual estimation, is 50 to 55%. The left ventricle has low normal function. There is mildly increased left ventricular hypertrophy. 3. Global right ventricle has normal systolic function.The right ventricular size is normal. 4. The mitral valve is normal in structure. Mild mitral valve regurgitation. 5. The tricuspid valve is normal in structure. Tricuspid valve regurgitation is trivial. 6. The aortic valve is abnormal. Moderately calcified. Aortic valve regurgitation is mild. 7. The pulmonic valve was grossly normal. Pulmonic valve regurgitation is not visualized. 8. 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 9. Evidence of atrial level shunting detected by color flow Doppler.  FINDINGS Left Ventricle: Left ventricular ejection fraction, by visual estimation, is 50 to 55%. The left ventricle has low normal function. The left ventricle has no regional wall motion abnormalities. There is mildly increased left ventricular hypertrophy.  Right Ventricle: The right ventricular size is normal. No increase in right ventricular wall thickness. Global RV systolic function is has normal systolic function.  Left Atrium: Left atrial size was mildly dilated. No left atrial appendage thrombus.  Right Atrium: Right atrial size was mildly dilated  Pericardium: There is no evidence of pericardial effusion.  Mitral Valve: The mitral valve is normal in structure. Mild mitral valve regurgitation.  Tricuspid Valve: The tricuspid valve is normal in structure. Tricuspid valve regurgitation is trivial.  Aortic Valve: The aortic valve is abnormal. Aortic valve regurgitation is mild. There is  moderate calcification of the aortic valve.  Pulmonic Valve: The pulmonic valve was grossly normal. Pulmonic valve regurgitation is not visualized.  Aorta: Aortic dilatation noted. 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  Shunts: Evidence of atrial level shunting detected by color flow Doppler.   Carson Clara MD Electronically signed by Carson Clara MD Signature Date/Time: 09/22/2019/5:35:37 PM    Final  MONITORS  LONG TERM MONITOR-LIVE TELEMETRY (3-14 DAYS) 12/19/2022  Narrative Patch Wear Time:  13 days and 13 hours (2024-04-10T09:38:13-0400 to 2024-04-23T23:30:59-0400)  Patient had a min HR of 52 bpm, max HR of 169 bpm, and avg HR of 75 bpm. Predominant underlying rhythm was Sinus Rhythm. First Degree AV Block was present. Slight P wave morphology changes were noted. 37 Supraventricular Tachycardia runs occurred, the run with the fastest interval lasting 7 beats with a max rate of 169 bpm, the longest lasting 20 beats with an avg rate of 101 bpm. No VT, atrial fibrillation, high degree block, or pauses noted. Isolated atrial and ventricular ectopy was rare (<1%). There was 1 triggered event, which was sinus with ectopy. No high risk arrhythmias detected.   CT SCANS  CT CORONARY MORPH W/CTA COR W/SCORE 10/21/2022  Addendum 10/21/2022  7:57 PM ADDENDUM REPORT: 10/21/2022 19:54  CLINICAL DATA:  Severe Aortic Stenosis.  EXAM: Cardiac TAVR CT  TECHNIQUE: A non-contrast, gated CT scan was obtained with axial slices of 3 mm through the heart for aortic valve calcium  scoring. A 120 kV retrospective, gated, contrast cardiac scan was obtained. Gantry rotation speed was 250 msecs and collimation was 0.6 mm. Nitroglycerin  was not given. The 3D data set was reconstructed in 5% intervals of the 0-95% of the R-R cycle. Systolic and diastolic phases were analyzed on a dedicated workstation using MPR, MIP, and VRT modes. The patient received 100 cc of  contrast.  FINDINGS: Image quality:  Excellent.  Noise artifact is: Limited.  Valve Morphology: Tricuspid aortic valve. Severely calcified leaflets. Bulky calcification of the LCC. Restricted movement in systole.  Aortic Valve Calcium  score: 3101  Aortic annular dimension:  Phase assessed: 10%  Annular area: 563 mm2  Annular perimeter: 85.4 mm  Max diameter: 28.3 mm  Min diameter: 25.7 mm  Annular and subannular calcification: Mild annular calcium  under the LCC.  Membranous septum length: 10.2 mm  Optimal coplanar projection: RAO 3 CRA 2  Coronary Artery Height above Annulus:  Left Main: 16.6 mm  Right Coronary: 18.1 mm  Sinus of Valsalva Measurements:  Non-coronary: 32.5 mm  Right-coronary: 31 mm  Left-coronary: 35.7 mm  Sinus of Valsalva Height:  Non-coronary: 23.7 mm  Right-coronary: 23.9 mm  Left-coronary: 24.8 mm  Sinotubular Junction: 30 mm  Ascending Thoracic Aorta: 37 mm  Coronary Arteries: Normal coronary origin. Co-dominance. The study was performed without use of NTG and is insufficient for plaque evaluation. Please refer to recent cardiac catheterization for coronary assessment. LAD stent noted. 3-vessel coronary calcifications.  Cardiac Morphology:  Right Atrium: Right atrial size is within normal limits.  Right Ventricle: The right ventricular cavity is within normal limits.  Left Atrium: Left atrial size is normal in size with no left atrial appendage filling defect.  Left Ventricle: The ventricular cavity size is within normal limits.  Pulmonary arteries: Dilated pulmonary artery suggestive of pulmonary hypertension.  Pulmonary veins: Normal pulmonary venous drainage.  Pericardium: Normal thickness with no significant effusion or calcium  present.  Mitral Valve: The mitral valve is normal structure without significant calcification.  Extra-cardiac findings: See attached radiology report for non-cardiac  structures.  IMPRESSION: 1. Tricuspid aortic valve. Severe aortic stenosis. Annular measurements favor a 29 mm S3 (563 mm2).  2. Mild annular calcium  under the LCC.  3. Sufficient coronary to annulus distance.  4. Optimal Fluoroscopic Angle for Delivery: RAO 3 CRA 2  5. Dilated pulmonary artery suggestive of pulmonary hypertension.  Melodee Spruce T. Rolm Clos, MD   Electronically Signed By: Jackquelyn Mass M.D. On: 10/21/2022 19:54  Narrative EXAM: OVER-READ INTERPRETATION  CT CHEST  The following report is a limited chest CT over-read performed by radiologist Dr. Avelino Lek of [facility] on 10/21/2022. This over-read does not include interpretation of cardiac or coronary anatomy or pathology. The cardiac TAVR interpretation by the cardiologist is attached.  COMPARISON:  None Available.  FINDINGS: Extracardiac findings will be described separately under dictation for contemporaneously obtained CTA chest, abdomen and pelvis.  IMPRESSION: Please see separate dictation for contemporaneously obtained CTA chest, abdomen and pelvis dated 11/21/2022 for full description of relevant extracardiac findings.  Electronically Signed: By: Avelino Lek M.D. On: 10/21/2022 14:49     ______________________________________________________________________________________________      Risk Assessment/Calculations:    CHA2DS2-VASc Score = 6   This indicates a 9.7% annual risk of stroke. The patient's score is based upon: CHF History: 0 HTN History: 1 Diabetes History: 1 Stroke History: 2 Vascular Disease History: 0 Age Score: 2 Gender Score: 0            Physical Exam:   VS:  BP (!) 90/48 (BP Location: Left Arm, Patient Position: Sitting, Cuff Size: Large)   Pulse 72   Ht 6\' 2"  (1.88 m)   Wt 293 lb (132.9 kg)   BMI 37.62 kg/m    Wt Readings from Last 3 Encounters:  01/02/24 293 lb (132.9 kg)  11/24/23 298 lb (135.2 kg)  06/27/23 295 lb (133.8 kg)  GEN: Well nourished, well developed in no acute distress NECK: No JVD; No carotid bruits CARDIAC: IRIR, no murmurs, rubs, gallops RESPIRATORY:  Diffuse wheezing throughout all lung fields ABDOMEN: Soft, non-tender, non-distended EXTREMITIES:  No edema; No deformity   ASSESSMENT AND PLAN: .    Diffuse wheezing on exam - CBC today to assess for elevated wbc count. Rx Levalbuterol  2puff q4h PRN. If wheeze does not improve, consider PFT and/or referral to pulmonology.  PAF / Hypercoagulable state - recurrent atrial fib likely contributory to dyspnea. Plan for cardioversion. Reports no missed doses Eliquis . CHA2DS2-VASc Score = 6 [CHF History: 0, HTN History: 1, Diabetes History: 1, Stroke History: 2, Vascular Disease History: 0, Age Score: 2, Gender Score: 0].  Therefore, the patient's annual risk of stroke is 9.7 %.    Denies bleeding complications. As rate controlled will continue Toprol  12.5mg  daily.  HFpEF - recent echo normal LVEF. Update proBNP, CBC, BMET. Diastolic dysfunction and fluid retention likely exacerbated by atrial fib. Continue Lasix  40mg  daily with careful monitoring of renal function due to CKD.  S/p TAVR - recent echo with appropriate TAVR function.    Informed Consent   Shared Decision Making/Informed Consent. The risks (stroke, cardiac arrhythmias rarely resulting in the need for a temporary or permanent pacemaker, skin irritation or burns and complications associated with conscious sedation including aspiration, arrhythmia, respiratory failure and death), benefits (restoration of normal sinus rhythm) and alternatives of a direct current cardioversion were explained in detail to Mr. Lauralee Poll and he agrees to proceed.       Dispo: follow up 2-3 months after cardioversion  Signed, Clearnce Curia, NP

## 2024-01-03 ENCOUNTER — Encounter (HOSPITAL_BASED_OUTPATIENT_CLINIC_OR_DEPARTMENT_OTHER): Payer: Self-pay | Admitting: Family

## 2024-01-03 LAB — BASIC METABOLIC PANEL WITH GFR
BUN/Creatinine Ratio: 15 (ref 10–24)
BUN: 37 mg/dL — ABNORMAL HIGH (ref 8–27)
CO2: 21 mmol/L (ref 20–29)
Calcium: 9.5 mg/dL (ref 8.6–10.2)
Chloride: 107 mmol/L — ABNORMAL HIGH (ref 96–106)
Creatinine, Ser: 2.46 mg/dL — ABNORMAL HIGH (ref 0.76–1.27)
Glucose: 146 mg/dL — ABNORMAL HIGH (ref 70–99)
Potassium: 4 mmol/L (ref 3.5–5.2)
Sodium: 145 mmol/L — ABNORMAL HIGH (ref 134–144)
eGFR: 26 mL/min/{1.73_m2} — ABNORMAL LOW (ref >59)

## 2024-01-03 LAB — PRO B NATRIURETIC PEPTIDE: NT-Pro BNP: 679 pg/mL — ABNORMAL HIGH (ref 0–486)

## 2024-01-03 LAB — CBC
Hematocrit: 37.9 % (ref 37.5–51.0)
Hemoglobin: 12.1 g/dL — ABNORMAL LOW (ref 13.0–17.7)
MCH: 30 pg (ref 26.6–33.0)
MCHC: 31.9 g/dL (ref 31.5–35.7)
MCV: 94 fL (ref 79–97)
Platelets: 198 10*3/uL (ref 150–450)
RBC: 4.03 x10E6/uL — ABNORMAL LOW (ref 4.14–5.80)
RDW: 13.3 % (ref 11.6–15.4)
WBC: 13 10*3/uL — ABNORMAL HIGH (ref 3.4–10.8)

## 2024-01-05 ENCOUNTER — Other Ambulatory Visit (HOSPITAL_COMMUNITY): Payer: Self-pay

## 2024-01-05 ENCOUNTER — Ambulatory Visit (HOSPITAL_BASED_OUTPATIENT_CLINIC_OR_DEPARTMENT_OTHER): Payer: Self-pay | Admitting: Family

## 2024-01-05 MED ORDER — FUROSEMIDE 40 MG PO TABS: ORAL_TABLET | ORAL | 3 refills | Status: AC

## 2024-01-06 ENCOUNTER — Other Ambulatory Visit (HOSPITAL_COMMUNITY): Payer: Self-pay

## 2024-01-06 ENCOUNTER — Telehealth: Payer: Self-pay

## 2024-01-06 DIAGNOSIS — R0602 Shortness of breath: Secondary | ICD-10-CM

## 2024-01-06 DIAGNOSIS — I10 Essential (primary) hypertension: Secondary | ICD-10-CM | POA: Diagnosis not present

## 2024-01-06 DIAGNOSIS — E1129 Type 2 diabetes mellitus with other diabetic kidney complication: Secondary | ICD-10-CM | POA: Diagnosis not present

## 2024-01-06 MED ORDER — ALBUTEROL SULFATE HFA 108 (90 BASE) MCG/ACT IN AERS
2.0000 | INHALATION_SPRAY | Freq: Four times a day (QID) | RESPIRATORY_TRACT | 2 refills | Status: DC | PRN
  Filled 2024-01-06: qty 6.7, 25d supply, fill #0

## 2024-01-06 NOTE — Telephone Encounter (Signed)
 Stopped levalbuterol . Started albuterol  per insurance preference. Will ask nursing team to make him aware.   Latavion Halls S Jasara Corrigan, NP

## 2024-01-06 NOTE — Telephone Encounter (Signed)
 Pharmacy Patient Advocate Encounter   Received notification from CoverMyMeds that prior authorization for LEVALBUTEROL  is required/requested.   Insurance verification completed.   The patient is insured through [facility] .   Per test claim:  ALBUTEROL  HFA OR VENTOLIN  HFA FIRST is preferred by the insurance.  If suggested medication is appropriate, Please send in a new RX and discontinue this one. If not, please advise as to why it's not appropriate so that we may request a Prior Authorization. Please note, some preferred medications may still require a PA.  If the suggested medications have not been trialed and there are no contraindications to their use, the PA will not be submitted, as it will not be approved.

## 2024-01-07 NOTE — Telephone Encounter (Signed)
 Unfortunately there is not really a way to know post cardioversion how long he will stay in normal sinus rhythm. Ablations are currently at minimum scheduling 2-3 months out to my knowledge. Often folks will get cardioversion to get back in rhythm and improve symptoms in the waiting for ablation. If he would like to see Dr. Lawana Pray to discuss ablation vs alternatives we can certainly get him scheduled.   Rania Prothero S Ardath Lepak, NP

## 2024-01-07 NOTE — Telephone Encounter (Signed)
 Spoke with daughter, ok per DPR Patient has had couple of cardioversion's in the past but ultimately ended up having an ablation  Both patient and daughter were wanting to know what the percentage or likely hood this would be that this may happen  If it is likely why would they not just go straight for the ablation   Will forward to Caitlin W NP for review

## 2024-01-07 NOTE — Telephone Encounter (Signed)
 Reviewed information with daughter and will keep things as scheduled for now

## 2024-01-07 NOTE — Telephone Encounter (Signed)
 Pt daughter requesting a c/b she would like to ask a few questions before the Cardioversion. Please advise

## 2024-01-08 ENCOUNTER — Telehealth: Payer: Self-pay | Admitting: Family

## 2024-01-08 ENCOUNTER — Other Ambulatory Visit (HOSPITAL_COMMUNITY): Payer: Self-pay

## 2024-01-08 DIAGNOSIS — R0602 Shortness of breath: Secondary | ICD-10-CM

## 2024-01-08 MED ORDER — ALBUTEROL SULFATE HFA 108 (90 BASE) MCG/ACT IN AERS: 2.0000 | INHALATION_SPRAY | Freq: Four times a day (QID) | RESPIRATORY_TRACT | 2 refills | Status: DC | PRN

## 2024-01-08 NOTE — Telephone Encounter (Signed)
 Pt c/o medication issue:  1. Name of Medication:   albuterol  (VENTOLIN  HFA) 108 (90 Base) MCG/ACT inhaler    2. How are you currently taking this medication (dosage and times per day)?   3. Are you having a reaction (difficulty breathing--STAT)?   4. What is your medication issue? This medication was suppose to go to the pharmacy on 5/20 but pharmacy still does not have it

## 2024-01-08 NOTE — Telephone Encounter (Signed)
 Called and spoke to pt. Albuterol  sent to wrong pharmacy. Resent inhaler to referred pharmacy.

## 2024-01-13 NOTE — Progress Notes (Signed)
 Pt called for pre procedure instructions. Arrival time 0715 NPO after midnight explained Instructed to take am meds with sip of water and confirmed blood thinner consistency.  Instructed to take Eliquis  in the am. Instructed pt need for ride home tomorrow and have responsible adult with them for 24 hrs post procedure.

## 2024-01-14 ENCOUNTER — Ambulatory Visit (HOSPITAL_COMMUNITY)
Admission: RE | Admit: 2024-01-14 | Discharge: 2024-01-14 | Disposition: A | Discharge status: Home / Self Care | Attending: Cardiology | Admitting: Cardiology

## 2024-01-14 ENCOUNTER — Telehealth (HOSPITAL_BASED_OUTPATIENT_CLINIC_OR_DEPARTMENT_OTHER): Payer: Self-pay

## 2024-01-14 ENCOUNTER — Other Ambulatory Visit: Payer: Self-pay

## 2024-01-14 ENCOUNTER — Ambulatory Visit (HOSPITAL_COMMUNITY): Payer: Self-pay

## 2024-01-14 ENCOUNTER — Other Ambulatory Visit (HOSPITAL_BASED_OUTPATIENT_CLINIC_OR_DEPARTMENT_OTHER)

## 2024-01-14 ENCOUNTER — Encounter (HOSPITAL_COMMUNITY)
Admission: RE | Disposition: A | Discharge status: Home / Self Care | Payer: Self-pay | Source: Home / Self Care | Attending: Cardiology

## 2024-01-14 DIAGNOSIS — I4819 Other persistent atrial fibrillation: Secondary | ICD-10-CM

## 2024-01-14 DIAGNOSIS — I480 Paroxysmal atrial fibrillation: Secondary | ICD-10-CM

## 2024-01-14 DIAGNOSIS — R0609 Other forms of dyspnea: Secondary | ICD-10-CM

## 2024-01-14 DIAGNOSIS — R0602 Shortness of breath: Secondary | ICD-10-CM

## 2024-01-14 SURGERY — CARDIOVERSION (CATH LAB)
Anesthesia: Monitor Anesthesia Care

## 2024-01-14 MED ORDER — SODIUM CHLORIDE 0.9 % IV SOLN: INTRAVENOUS | Status: DC

## 2024-01-14 NOTE — Interval H&P Note (Signed)
 History and Physical Interval Note:  01/14/2024 8:20 AM  Brandon Sanchez  has presented today for surgery, with the diagnosis of AFIB.  The various methods of treatment have been discussed with the patient and family. After consideration of risks, benefits and other options for treatment, the patient has consented to  Procedure(s): CARDIOVERSION (N/A) as a surgical intervention.  The patient's history has been reviewed, patient examined, no change in status, stable for surgery.  I have reviewed the patient's chart and labs.  Questions were answered to the patient's satisfaction.    I reviewed the patient's telemetry, which was regular. There was baseline artifact but appeared to be P waves. 12 lead performed; also regular with appearance of P waves. These were not prominent in V1 as they had been in the past, but still consistent with sinus rhythm. Procedure will be cancelled.  He reports shortness of breath and wheezing. Uses his inhaler periodically at home with improvement. Denies weight gain, actually reports 10 lb weight loss over recent months. Edema is at baseline.  Discussed that he should use his inhaler as instructed. We discussed an event monitor to see if his symptoms are related to afib or not. Also recommend that he follow up with pulmonary given his progressive shortness of breath/wheezing. Haze Antillon Veryl Gottron

## 2024-01-14 NOTE — Telephone Encounter (Signed)
 Received secure chat from Dr. Gwynn Lesches- pt was seen today for Cardioversion- back in rhythm- she would like for pt to wear heart monitor. He will stop by office after discharge to have placed & schedule with pulmonology.

## 2024-01-15 ENCOUNTER — Ambulatory Visit: Admitting: Pulmonary Disease

## 2024-01-15 ENCOUNTER — Ambulatory Visit

## 2024-01-15 ENCOUNTER — Encounter: Payer: Self-pay | Admitting: Pulmonary Disease

## 2024-01-15 VITALS — BP 118/68 | HR 94 | Ht 74.0 in | Wt 292.2 lb

## 2024-01-15 DIAGNOSIS — R061 Stridor: Secondary | ICD-10-CM | POA: Diagnosis not present

## 2024-01-15 DIAGNOSIS — J4541 Moderate persistent asthma with (acute) exacerbation: Secondary | ICD-10-CM | POA: Diagnosis not present

## 2024-01-15 MED ORDER — FLUTICASONE-SALMETEROL 500-50 MCG/ACT IN AEPB: 1.0000 | INHALATION_SPRAY | Freq: Two times a day (BID) | RESPIRATORY_TRACT | 3 refills | Status: DC

## 2024-01-15 MED ORDER — PREDNISONE 20 MG PO TABS: ORAL_TABLET | ORAL | 0 refills | Status: AC

## 2024-01-15 NOTE — Patient Instructions (Signed)
 Take Advair 1 puff in the morning and 1 puff in the evening, you can start that this evening.  Rinse her mouth out with water after every use.  Continue your rescue inhaler as needed  Take prednisone  as prescribed, hopefully will start helping in the next 2 to 3 days  CT chest ordered to make sure is no abnormality in the ear tubes it could mimic the sounds  Chest x-ray today to make sure not missing anything big  Return to clinic in 2 to 4 weeks with Dr. Marygrace Snellen or APP

## 2024-01-15 NOTE — Progress Notes (Signed)
 @Patient  ID: Brandon Sanchez, male    DOB: 09-07-1945, 78 y.o.   MRN: [id number]  Chief Complaint  Patient presents with   Acute Visit    Sob and wheezing. Patient had a heart valve replacement and symptom came back after last month.    Referring provider: Imelda Man, MD  HPI:   78 y.o. man with asthma contributing to fixed obstruction whom we are seeing for an acute visit with wheeze and dyspnea on exertion.  Last seen in pulmonary clinic 2023, follow-up visits are not scheduled despite instructions to do so.  Multiple prior pulmonary notes from Genetta Kenning, MD reviewed.  Diagnosed with asthma.  Symptoms improved with Symbicort in terms of cough and shortness of breath.  Says like he been doing well for some time.  Many months to a couple years.  Has not followed up in nearly 2 years.  About 4 weeks ago developed onset of dyspnea on exertion.  Significant worse than prior.  As well as wheeze.  No chest imaging done.  Was prescribed rescue inhaler.  This helps a little bit.  Temporarily.  We discussed he certainly has audible wheezing but also concern for hypertension Tory sounds nearly like stridor.  He denies any recent intubations.  No recent surgeries.  No manipulation of his trachea or neck.  We discussed that certainly asthma exacerbation and recurrent asthma could cause wheezing dyspnea but the inspiratory sound is unusual.  We discussed a plan for this.  Questionaires / Pulmonary Flowsheets:   ACT:  Asthma Control Test ACT Total Score  04/08/2022  8:57 AM 18  10/31/2021  9:08 AM 17    MMRC:     No data to display          Epworth:      No data to display          Tests:   FENO:  No results found for: "NITRICOXIDE"  PFT:    Latest Ref Rng & Units 01/01/2022    8:44 AM  PFT Results  FVC-Pre L 3.54   FVC-Predicted Pre % 73   FVC-Post L 3.74   FVC-Predicted Post % 78   Pre FEV1/FVC % % 61   Post FEV1/FCV % % 66   FEV1-Pre L 2.16   FEV1-Predicted Pre  % 61   FEV1-Post L 2.45   DLCO uncorrected ml/min/mmHg 26.76   DLCO UNC% % 96   DLCO corrected ml/min/mmHg 26.76   DLCO COR %Predicted % 96   DLVA Predicted % 102   TLC L 7.36   TLC % Predicted % 96   RV % Predicted % 135   Personally viewed interpreted as moderate fixed obstruction, significant bronchodilator response in FEV1, lung volumes with air trapping, DLCO within normal limits  WALK:      No data to display          Imaging: Personally reviewed and as per EMR and discussion in this note No results found.  Lab Results: Personally reviewed CBC    Component Value Date/Time   WBC 13.0 (H) 01/02/2024 1059   WBC 9.0 11/27/2022 0158   RBC 4.03 (L) 01/02/2024 1059   RBC 3.73 (L) 11/27/2022 0158   HGB 12.1 (L) 01/02/2024 1059   HCT 37.9 01/02/2024 1059   PLT 198 01/02/2024 1059   MCV 94 01/02/2024 1059   MCH 30.0 01/02/2024 1059   MCH 29.8 11/27/2022 0158   MCHC 31.9 01/02/2024 1059   MCHC 32.2 11/27/2022 0158  RDW 13.3 01/02/2024 1059   LYMPHSABS 4.1 (H) 07/11/2021 0301   MONOABS 0.8 07/11/2021 0301   EOSABS 0.4 07/11/2021 0301   BASOSABS 0.1 07/11/2021 0301    BMET    Component Value Date/Time   NA 145 (H) 01/02/2024 1059   K 4.0 01/02/2024 1059   CL 107 (H) 01/02/2024 1059   CO2 21 01/02/2024 1059   GLUCOSE 146 (H) 01/02/2024 1059   GLUCOSE 170 (H) 11/27/2022 0158   BUN 37 (H) 01/02/2024 1059   CREATININE 2.46 (H) 01/02/2024 1059   CALCIUM  9.5 01/02/2024 1059   GFRNONAA 56 (L) 11/27/2022 0158   GFRAA 64 03/06/2020 0758    BNP    Component Value Date/Time   BNP 289.1 (H) 09/20/2019 0515    ProBNP    Component Value Date/Time   PROBNP 679 (H) 01/02/2024 1059    Specialty Problems       Pulmonary Problems   ALLERGIC RHINITIS   Qualifier: Diagnosis of  By: Maryjane Snider LPN, Megan   Replacing diagnoses that were inactivated after the 11/18/22 regulatory import      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   PFT's  02/01/2008:  FEV1 2.68 (66%) ratio 55 p 59% improvement p saba/  DLCO normal Spiro   12/25/2011:   FEV1 2.31 (56%), ratio 63        CAP (community acquired pneumonia)   Mild persistent asthma without complication   Shortness of breath    No Known Allergies  Immunization History  Administered Date(s) Administered   Influenza Split 08/19/2012   Influenza Whole 08/19/2008   Influenza, High Dose Seasonal PF 09/18/2016   Influenza, Quadrivalent, Recombinant, Inj, Pf 04/25/2017, 05/21/2018, 05/24/2019, 05/23/2020, 05/09/2021   Influenza-Unspecified 05/08/2011, 06/12/2012, 04/23/2013   PFIZER(Purple Top)SARS-COV-2 Vaccination 10/25/2019, 11/08/2019, 04/05/2020   Pneumococcal Conjugate-13 11/10/2013   Pneumococcal Polysaccharide-23 06/12/2012   Td 01/08/2018   Tdap 09/03/2007   Zoster Recombinant(Shingrix) 06/26/2020, 01/01/2021   Zoster, Live 10/25/2011    Past Medical History:  Diagnosis Date   Allergic rhinitis, cause unspecified    Asthma    Atrial fibrillation (HCC)    CAD (coronary artery disease)    Cataract cortical, senile, bilateral    Macular degeneration of left eye    Other and unspecified hyperlipidemia    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 Abel Hoe and Dr. Sherene Dilling   Type II or unspecified type diabetes mellitus without mention of complication, not stated as uncontrolled    Unspecified essential hypertension     Tobacco History: Social History   Tobacco Use  Smoking Status Never  Smokeless Tobacco Never   Counseling given: Not Answered   Continue to not smoke  Outpatient Encounter Medications as of 01/15/2024  Medication Sig   acetaminophen  (TYLENOL ) 500 MG tablet Take 1,000 mg by mouth every 6 (six) hours as needed for moderate pain (pain score 4-6).   albuterol  (VENTOLIN  HFA) 108 (90 Base) MCG/ACT inhaler Inhale 2 puffs into the lungs every 6 (six) hours as needed for wheezing or shortness of breath.    amoxicillin  (AMOXIL ) 500 MG tablet Take 4 tablets (2,000 mg total) by mouth as directed. 1 HOUR PRIOR TO DENTAL APPOINTMENTS   apixaban  (ELIQUIS ) 5 MG TABS tablet TAKE 1 TABLET(5 MG) BY MOUTH TWICE DAILY   atorvastatin  (LIPITOR ) 80 MG tablet Take 1 tablet (80 mg total) by mouth every evening.   B-D ULTRAFINE III SHORT PEN 31G X 8 MM MISC SMARTSIG:Pre-Filled Pen Syringe  SUB-Q 3 Times Daily   Cholecalciferol (VITAMIN D3) 50 MCG (2000 UT) TABS Take 2,000 Units by mouth at bedtime.   fluticasone -salmeterol (ADVAIR) 500-50 MCG/ACT AEPB Inhale 1 puff into the lungs in the morning and at bedtime.   furosemide  (LASIX ) 40 MG tablet Take 20mg  then 40mg  every other dy   GLUCOSAMINE-CHONDROITIN PO Take 1 tablet by mouth 2 (two) times daily.   metoprolol  succinate (TOPROL  XL) 25 MG 24 hr tablet Take 0.5 tablets (12.5 mg total) by mouth daily.   Multiple Vitamin (MULTIVITAMIN WITH MINERALS) TABS tablet Take 1 tablet by mouth daily.   MYRBETRIQ  25 MG TB24 tablet Take 25 mg by mouth in the morning.   NOVOLOG  MIX 70/30 FLEXPEN (70-30) 100 UNIT/ML FlexPen Inject 30 Units into the skin in the morning and at bedtime.   ONETOUCH VERIO test strip 1 each by Other route in the morning, at noon, and at bedtime.    polyethylene glycol (MIRALAX  / GLYCOLAX ) 17 g packet Take 17 g by mouth daily.   predniSONE (DELTASONE) 20 MG tablet Take 2 tablets (40 mg total) by mouth daily with breakfast for 5 days, THEN 1 tablet (20 mg total) daily with breakfast for 5 days.   RYBELSUS 3 MG TABS Take 3 mg by mouth daily.   sertraline  (ZOLOFT ) 100 MG tablet Take 100 mg by mouth in the morning.   Sodium Fluoride (PREVIDENT 5000 BOOSTER DT) Place 1 Application onto teeth in the morning and at bedtime.   [DISCONTINUED] 0.9 %  sodium chloride  infusion    No facility-administered encounter medications on file as of 01/15/2024.     Review of Systems  Review of Systems  No chest pain with exertion.  No orthopnea or PND.  Comprehensive  review of systems otherwise negative. Physical Exam  BP 118/68 (BP Location: Left Arm, Patient Position: Sitting, Cuff Size: Large)   Pulse 94   Ht 6\' 2"  (1.88 m)   Wt 292 lb 3.2 oz (132.5 kg)   SpO2 94%   BMI 37.52 kg/m   Wt Readings from Last 5 Encounters:  01/15/24 292 lb 3.2 oz (132.5 kg)  01/02/24 293 lb (132.9 kg)  11/24/23 298 lb (135.2 kg)  06/27/23 295 lb (133.8 kg)  01/01/23 295 lb (133.8 kg)    BMI Readings from Last 5 Encounters:  01/15/24 37.52 kg/m  01/02/24 37.62 kg/m  11/24/23 38.26 kg/m  06/27/23 37.88 kg/m  01/01/23 37.88 kg/m     Physical Exam General: Sitting in chair in no acute distress, audible high-pitched sounds with inspiration expiration noticeable Pulmonary: Diminished on the left, diffuse expiratory wheeze on the right, high-pitched inspiratory sounds not heard in the lungs Cardiovascular: Regular rate and rhythm  Assessment & Plan:   Wheeze, DOE: Present for 4 weeks.  Quite possible reactivation of prior asthma previous under good control.  However he has inspiratory high-pitched sounds.  Also with diminished breath sounds on the left.  Clear wheeze on the right.  He denies any aspiration, do wonder about foreign body.  No risk factors for tracheal stenosis etc.  CT chest ordered to evaluate for any tracheal stenosis that could have developed in the interim.  Chest x-ray today to evaluate for diminished breath sounds on the left.  Resume Advair, this time high dose Diskus 1 puff twice a day new prescription.  Continue rescue inhaler as needed.  Prednisone taper prescribed.   Return for 2-4 weeks f/u Dr. Marygrace Snellen or APP.   Guerry Leek, MD 01/15/2024  This appointment required 40 minutes of patient care (this includes precharting, chart review, review of results, face-to-face care, etc.).

## 2024-01-16 ENCOUNTER — Ambulatory Visit
Admission: RE | Admit: 2024-01-16 | Discharge: 2024-01-16 | Disposition: A | Discharge status: Home / Self Care | Source: Ambulatory Visit | Attending: Pulmonary Disease | Admitting: Pulmonary Disease

## 2024-01-16 DIAGNOSIS — R0602 Shortness of breath: Secondary | ICD-10-CM | POA: Diagnosis not present

## 2024-01-16 DIAGNOSIS — R061 Stridor: Secondary | ICD-10-CM

## 2024-01-16 DIAGNOSIS — J4541 Moderate persistent asthma with (acute) exacerbation: Secondary | ICD-10-CM

## 2024-01-20 ENCOUNTER — Other Ambulatory Visit

## 2024-01-20 ENCOUNTER — Encounter: Payer: Self-pay | Admitting: Pulmonary Disease

## 2024-01-20 MED ORDER — PREDNISONE 10 MG PO TABS: 10.0000 mg | ORAL_TABLET | Freq: Every day | ORAL | 0 refills | Status: AC

## 2024-01-20 NOTE — Telephone Encounter (Signed)
**Note De-identified  [place] Obfuscation** Please advise 

## 2024-01-20 NOTE — Telephone Encounter (Signed)
 Addressed via MyChart message

## 2024-01-29 ENCOUNTER — Encounter (HOSPITAL_BASED_OUTPATIENT_CLINIC_OR_DEPARTMENT_OTHER): Payer: Self-pay

## 2024-01-30 ENCOUNTER — Other Ambulatory Visit: Payer: Self-pay

## 2024-01-30 MED ORDER — METOPROLOL SUCCINATE ER 25 MG PO TB24: 12.5000 mg | ORAL_TABLET | Freq: Every day | ORAL | 3 refills | Status: DC

## 2024-02-02 ENCOUNTER — Ambulatory Visit: Payer: Self-pay | Admitting: Pulmonary Disease

## 2024-02-03 ENCOUNTER — Ambulatory Visit (HOSPITAL_BASED_OUTPATIENT_CLINIC_OR_DEPARTMENT_OTHER): Admitting: Family

## 2024-02-06 DIAGNOSIS — R0602 Shortness of breath: Secondary | ICD-10-CM | POA: Diagnosis not present

## 2024-02-06 DIAGNOSIS — I480 Paroxysmal atrial fibrillation: Secondary | ICD-10-CM | POA: Diagnosis not present

## 2024-02-11 ENCOUNTER — Encounter (HOSPITAL_BASED_OUTPATIENT_CLINIC_OR_DEPARTMENT_OTHER): Payer: Self-pay

## 2024-02-19 ENCOUNTER — Encounter: Payer: Self-pay | Admitting: Pulmonary Disease

## 2024-02-19 ENCOUNTER — Ambulatory Visit: Admitting: Pulmonary Disease

## 2024-02-19 DIAGNOSIS — J45909 Unspecified asthma, uncomplicated: Secondary | ICD-10-CM | POA: Diagnosis not present

## 2024-02-19 DIAGNOSIS — J4541 Moderate persistent asthma with (acute) exacerbation: Secondary | ICD-10-CM

## 2024-02-19 MED ORDER — FLUTICASONE-SALMETEROL 500-50 MCG/ACT IN AEPB: 1.0000 | INHALATION_SPRAY | Freq: Two times a day (BID) | RESPIRATORY_TRACT | 6 refills | Status: AC

## 2024-02-19 NOTE — Progress Notes (Signed)
 @Patient  ID: Brandon Sanchez, male    DOB: 11-Mar-1946, 78 y.o.   MRN: [id number]  Chief Complaint  Patient presents with   Follow-up    Referring provider: Clarice Nottingham, MD  HPI:   78 y.o. man with asthma contributing to fixed obstruction on PFTs in the past with acute visit late May 2025 with onset of severe wheezing shortness of breath who were seen for routine follow-up.  Doing well.  Steroid taper and Advair at last visit.  Symptoms resolved.  Cross-sectional imaging send given concern for possible tracheal pathology with equivocal stridor.  This was all normal and reassuring.  Advair doing well.  No issues.  HPI initial visit: Diagnosed with asthma.  Symptoms improved with Symbicort in terms of cough and shortness of breath.  Says like he been doing well for some time.  Many months to a couple years.  Has not followed up in nearly 2 years.  About 4 weeks ago developed onset of dyspnea on exertion.  Significant worse than prior.  As well as wheeze.  No chest imaging done.  Was prescribed rescue inhaler.  This helps a little bit.  Temporarily.  We discussed he certainly has audible wheezing but also concern for hypertension Tory sounds nearly like stridor.  He denies any recent intubations.  No recent surgeries.  No manipulation of his trachea or neck.  We discussed that certainly asthma exacerbation and recurrent asthma could cause wheezing dyspnea but the inspiratory sound is unusual.  We discussed a plan for this.  Questionaires / Pulmonary Flowsheets:   ACT:  Asthma Control Test ACT Total Score  04/08/2022  8:57 AM 18  10/31/2021  9:08 AM 17    MMRC:     No data to display          Epworth:      No data to display          Tests:   FENO:  No results found for: NITRICOXIDE  PFT:    Latest Ref Rng & Units 01/01/2022    8:44 AM  PFT Results  FVC-Pre L 3.54   FVC-Predicted Pre % 73   FVC-Post L 3.74   FVC-Predicted Post % 78   Pre FEV1/FVC % % 61    Post FEV1/FCV % % 66   FEV1-Pre L 2.16   FEV1-Predicted Pre % 61   FEV1-Post L 2.45   DLCO uncorrected ml/min/mmHg 26.76   DLCO UNC% % 96   DLCO corrected ml/min/mmHg 26.76   DLCO COR %Predicted % 96   DLVA Predicted % 102   TLC L 7.36   TLC % Predicted % 96   RV % Predicted % 135   Personally viewed interpreted as moderate fixed obstruction, significant bronchodilator response in FEV1, lung volumes with air trapping, DLCO within normal limits  WALK:      No data to display          Imaging: Personally reviewed and as per EMR and discussion in this note No results found.  Lab Results: Personally reviewed CBC    Component Value Date/Time   WBC 13.0 (H) 01/02/2024 1059   WBC 9.0 11/27/2022 0158   RBC 4.03 (L) 01/02/2024 1059   RBC 3.73 (L) 11/27/2022 0158   HGB 12.1 (L) 01/02/2024 1059   HCT 37.9 01/02/2024 1059   PLT 198 01/02/2024 1059   MCV 94 01/02/2024 1059   MCH 30.0 01/02/2024 1059   MCH 29.8 11/27/2022 0158   MCHC 31.9 01/02/2024  1059   MCHC 32.2 11/27/2022 0158   RDW 13.3 01/02/2024 1059   LYMPHSABS 4.1 (H) 07/11/2021 0301   MONOABS 0.8 07/11/2021 0301   EOSABS 0.4 07/11/2021 0301   BASOSABS 0.1 07/11/2021 0301    BMET    Component Value Date/Time   NA 145 (H) 01/02/2024 1059   K 4.0 01/02/2024 1059   CL 107 (H) 01/02/2024 1059   CO2 21 01/02/2024 1059   GLUCOSE 146 (H) 01/02/2024 1059   GLUCOSE 170 (H) 11/27/2022 0158   BUN 37 (H) 01/02/2024 1059   CREATININE 2.46 (H) 01/02/2024 1059   CALCIUM  9.5 01/02/2024 1059   GFRNONAA 56 (L) 11/27/2022 0158   GFRAA 64 03/06/2020 0758    BNP    Component Value Date/Time   BNP 289.1 (H) 09/20/2019 0515    ProBNP    Component Value Date/Time   PROBNP 679 (H) 01/02/2024 1059    Specialty Problems       Pulmonary Problems   ALLERGIC RHINITIS   Qualifier: Diagnosis of  By: Germaine LPN, Megan   Replacing diagnoses that were inactivated after the 11/18/22 regulatory import      Moderate  persistent chronic asthma without complication   PFT's 02/01/2008:  FEV1 2.68 (66%) ratio 55 p 59% improvement p saba/  DLCO normal Spiro   12/25/2011:   FEV1 2.31 (56%), ratio 63        CAP (community acquired pneumonia)   Mild persistent asthma without complication   Shortness of breath    No Known Allergies  Immunization History  Administered Date(s) Administered   Influenza Split 08/19/2012   Influenza Whole 08/19/2008   Influenza, High Dose Seasonal PF 09/18/2016   Influenza, Quadrivalent, Recombinant, Inj, Pf 04/25/2017, 05/21/2018, 05/24/2019, 05/23/2020, 05/09/2021   Influenza-Unspecified 05/08/2011, 06/12/2012, 04/23/2013   PFIZER(Purple Top)SARS-COV-2 Vaccination 10/25/2019, 11/08/2019, 04/05/2020   Pneumococcal Conjugate-13 11/10/2013   Pneumococcal Polysaccharide-23 06/12/2012   Td 01/08/2018   Tdap 09/03/2007   Zoster Recombinant(Shingrix) 06/26/2020, 01/01/2021   Zoster, Live 10/25/2011    Past Medical History:  Diagnosis Date   Allergic rhinitis, cause unspecified    Asthma    Atrial fibrillation (HCC)    CAD (coronary artery disease)    Cataract cortical, senile, bilateral    Macular degeneration of left eye    Other and unspecified hyperlipidemia    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 Verlin and Dr. Lucas   Type II or unspecified type diabetes mellitus without mention of complication, not stated as uncontrolled    Unspecified essential hypertension     Tobacco History: Social History   Tobacco Use  Smoking Status Never  Smokeless Tobacco Never   Counseling given: Not Answered   Continue to not smoke  Outpatient Encounter Medications as of 02/19/2024  Medication Sig   acetaminophen  (TYLENOL ) 500 MG tablet Take 1,000 mg by mouth every 6 (six) hours as needed for moderate pain (pain score 4-6).   albuterol  (VENTOLIN  HFA) 108 (90 Base) MCG/ACT inhaler Inhale 2 puffs into the lungs every 6 (six)  hours as needed for wheezing or shortness of breath.   amoxicillin  (AMOXIL ) 500 MG tablet Take 4 tablets (2,000 mg total) by mouth as directed. 1 HOUR PRIOR TO DENTAL APPOINTMENTS   apixaban  (ELIQUIS ) 5 MG TABS tablet TAKE 1 TABLET(5 MG) BY MOUTH TWICE DAILY   atorvastatin  (LIPITOR ) 80 MG tablet Take 1 tablet (80 mg total) by mouth every evening.   B-D ULTRAFINE III SHORT  PEN 31G X 8 MM MISC SMARTSIG:Pre-Filled Pen Syringe SUB-Q 3 Times Daily   Cholecalciferol (VITAMIN D3) 50 MCG (2000 UT) TABS Take 2,000 Units by mouth at bedtime.   fluticasone -salmeterol (ADVAIR) 500-50 MCG/ACT AEPB Inhale 1 puff into the lungs in the morning and at bedtime.   furosemide  (LASIX ) 40 MG tablet Take 20mg  then 40mg  every other dy   GLUCOSAMINE-CHONDROITIN PO Take 1 tablet by mouth 2 (two) times daily.   metoprolol  succinate (TOPROL  XL) 25 MG 24 hr tablet Take 0.5 tablets (12.5 mg total) by mouth daily.   Multiple Vitamin (MULTIVITAMIN WITH MINERALS) TABS tablet Take 1 tablet by mouth daily.   MYRBETRIQ  25 MG TB24 tablet Take 25 mg by mouth in the morning.   NOVOLOG  MIX 70/30 FLEXPEN (70-30) 100 UNIT/ML FlexPen Inject 30 Units into the skin in the morning and at bedtime.   ONETOUCH VERIO test strip 1 each by Other route in the morning, at noon, and at bedtime.    polyethylene glycol (MIRALAX  / GLYCOLAX ) 17 g packet Take 17 g by mouth daily.   RYBELSUS 3 MG TABS Take 3 mg by mouth daily.   sertraline  (ZOLOFT ) 100 MG tablet Take 100 mg by mouth in the morning.   Sodium Fluoride (PREVIDENT 5000 BOOSTER DT) Place 1 Application onto teeth in the morning and at bedtime.   [DISCONTINUED] fluticasone -salmeterol (ADVAIR) 500-50 MCG/ACT AEPB Inhale 1 puff into the lungs in the morning and at bedtime.   No facility-administered encounter medications on file as of 02/19/2024.     Review of Systems  Review of Systems  N/a Physical Exam  BP 124/66 (BP Location: Left Arm, Patient Position: Sitting, Cuff Size: Large)    Pulse 78   Ht 6' 2 (1.88 m)   Wt 296 lb (134.3 kg)   SpO2 95%   BMI 38.00 kg/m   Wt Readings from Last 5 Encounters:  02/19/24 296 lb (134.3 kg)  01/15/24 292 lb 3.2 oz (132.5 kg)  01/02/24 293 lb (132.9 kg)  11/24/23 298 lb (135.2 kg)  06/27/23 295 lb (133.8 kg)    BMI Readings from Last 5 Encounters:  02/19/24 38.00 kg/m  01/15/24 37.52 kg/m  01/02/24 37.62 kg/m  11/24/23 38.26 kg/m  06/27/23 37.88 kg/m     Physical Exam General: Sitting in chair in no acute distress Eyes: EOMI, no icterus Neck: Supple, JVP appreciated Pulmonary: Clear, normal work of breathing Cardiovascular: Regular rate and rhythm Abdomen: Nondistended, bowel sounds present MSK: No synovitis no joint effusion Neuro: Normal gait, no weakness Psych: Normal mood, full affect  Assessment & Plan:   Wheeze, DOE: Present for a few weeks may to June 2025.  Quite possible reactivation of prior asthma previous under good control.  However he has inspiratory high-pitched sounds.  Also with diminished breath sounds on the left.  Clear wheeze on the right.  CT scan without any tracheal pathology or tracheal malacia.  Improved with steroids and Advair.  Refill Advair today.  Reassess or de-escalate in the future if doing better or consider escalation of triple inhaled therapy if worsens in the future.  Asthma: As discussed above.  Advair therapy.  Return in about 6 months (around 08/21/2024) for f/u Dr. Annella.   Donnice JONELLE Annella, MD 02/19/2024

## 2024-02-19 NOTE — Patient Instructions (Signed)
 I am glad you are doing better  No change to medication, Advair refilled today  Return to clinic in 6 months or sooner as needed with Dr. Annella

## 2024-03-22 DIAGNOSIS — R0602 Shortness of breath: Secondary | ICD-10-CM

## 2024-03-22 DIAGNOSIS — I4819 Other persistent atrial fibrillation: Secondary | ICD-10-CM

## 2024-03-22 DIAGNOSIS — I480 Paroxysmal atrial fibrillation: Secondary | ICD-10-CM

## 2024-03-22 DIAGNOSIS — R0609 Other forms of dyspnea: Secondary | ICD-10-CM

## 2024-03-31 ENCOUNTER — Ambulatory Visit (HOSPITAL_BASED_OUTPATIENT_CLINIC_OR_DEPARTMENT_OTHER): Payer: Self-pay | Admitting: Cardiology

## 2024-04-13 DIAGNOSIS — H3563 Retinal hemorrhage, bilateral: Secondary | ICD-10-CM | POA: Diagnosis not present

## 2024-05-12 DIAGNOSIS — E1129 Type 2 diabetes mellitus with other diabetic kidney complication: Secondary | ICD-10-CM | POA: Diagnosis not present

## 2024-05-12 DIAGNOSIS — I10 Essential (primary) hypertension: Secondary | ICD-10-CM | POA: Diagnosis not present

## 2024-05-18 DIAGNOSIS — E1129 Type 2 diabetes mellitus with other diabetic kidney complication: Secondary | ICD-10-CM | POA: Diagnosis not present

## 2024-05-18 DIAGNOSIS — Z23 Encounter for immunization: Secondary | ICD-10-CM | POA: Diagnosis not present

## 2024-05-18 DIAGNOSIS — E039 Hypothyroidism, unspecified: Secondary | ICD-10-CM | POA: Diagnosis not present

## 2024-05-18 DIAGNOSIS — N189 Chronic kidney disease, unspecified: Secondary | ICD-10-CM | POA: Diagnosis not present

## 2024-05-18 DIAGNOSIS — I10 Essential (primary) hypertension: Secondary | ICD-10-CM | POA: Diagnosis not present

## 2024-05-25 DIAGNOSIS — E1122 Type 2 diabetes mellitus with diabetic chronic kidney disease: Secondary | ICD-10-CM | POA: Diagnosis not present

## 2024-05-25 DIAGNOSIS — R200 Anesthesia of skin: Secondary | ICD-10-CM | POA: Diagnosis not present

## 2024-05-25 DIAGNOSIS — I482 Chronic atrial fibrillation, unspecified: Secondary | ICD-10-CM | POA: Diagnosis not present

## 2024-05-25 DIAGNOSIS — E872 Acidosis, unspecified: Secondary | ICD-10-CM | POA: Diagnosis not present

## 2024-05-25 DIAGNOSIS — N1831 Chronic kidney disease, stage 3a: Secondary | ICD-10-CM | POA: Diagnosis not present

## 2024-05-25 DIAGNOSIS — I2510 Atherosclerotic heart disease of native coronary artery without angina pectoris: Secondary | ICD-10-CM | POA: Diagnosis not present

## 2024-05-25 DIAGNOSIS — D631 Anemia in chronic kidney disease: Secondary | ICD-10-CM | POA: Diagnosis not present

## 2024-05-25 DIAGNOSIS — N200 Calculus of kidney: Secondary | ICD-10-CM | POA: Diagnosis not present

## 2024-05-31 ENCOUNTER — Other Ambulatory Visit: Payer: Self-pay | Admitting: Family

## 2024-05-31 DIAGNOSIS — R0602 Shortness of breath: Secondary | ICD-10-CM

## 2024-06-01 DIAGNOSIS — E782 Mixed hyperlipidemia: Secondary | ICD-10-CM | POA: Diagnosis not present

## 2024-06-01 DIAGNOSIS — E039 Hypothyroidism, unspecified: Secondary | ICD-10-CM | POA: Diagnosis not present

## 2024-06-01 DIAGNOSIS — I10 Essential (primary) hypertension: Secondary | ICD-10-CM | POA: Diagnosis not present

## 2024-06-01 DIAGNOSIS — R21 Rash and other nonspecific skin eruption: Secondary | ICD-10-CM | POA: Diagnosis not present

## 2024-06-01 DIAGNOSIS — E1129 Type 2 diabetes mellitus with other diabetic kidney complication: Secondary | ICD-10-CM | POA: Diagnosis not present

## 2024-06-03 NOTE — Telephone Encounter (Signed)
 For review-are we continuing to manage or does it need to go to pcp

## 2024-06-03 NOTE — Telephone Encounter (Signed)
 Pt of Dr. Lonni. Last OV with Reche Finder NP, this was reordered. Does Reche Finder NP want to refill this? Please advise.

## 2024-06-04 DIAGNOSIS — L578 Other skin changes due to chronic exposure to nonionizing radiation: Secondary | ICD-10-CM | POA: Diagnosis not present

## 2024-06-09 ENCOUNTER — Other Ambulatory Visit: Payer: Self-pay | Admitting: Family

## 2024-06-09 DIAGNOSIS — R0602 Shortness of breath: Secondary | ICD-10-CM

## 2024-06-18 DIAGNOSIS — L578 Other skin changes due to chronic exposure to nonionizing radiation: Secondary | ICD-10-CM | POA: Diagnosis not present

## 2024-07-29 ENCOUNTER — Other Ambulatory Visit: Payer: Self-pay

## 2024-07-29 MED ORDER — APIXABAN 5 MG PO TABS: 5.0000 mg | ORAL_TABLET | Freq: Two times a day (BID) | ORAL | 3 refills | Status: AC

## 2024-07-29 NOTE — Telephone Encounter (Signed)
 Prescription refill request for Eliquis  received. Indication:afib Last office visit:5/25 Scr: 2.46  5/25 Age:79 Weight:134.3  kg  Prescription refilled

## 2024-09-03 ENCOUNTER — Other Ambulatory Visit: Payer: Self-pay | Admitting: Family

## 2025-01-03 ENCOUNTER — Ambulatory Visit: Admitting: Neurology
